# Patient Record
Sex: Female | Born: 1970 | Race: White | Hispanic: No | Marital: Single | Smoking: Former smoker
Health system: Southern US, Community
[De-identification: ages and names within clinical notes are randomized; demographics above are authoritative.]

## PROBLEM LIST (undated history)

## (undated) DIAGNOSIS — J449 Chronic obstructive pulmonary disease, unspecified: Secondary | ICD-10-CM

## (undated) DIAGNOSIS — J45909 Unspecified asthma, uncomplicated: Secondary | ICD-10-CM

## (undated) DIAGNOSIS — I639 Cerebral infarction, unspecified: Secondary | ICD-10-CM

## (undated) DIAGNOSIS — T7840XA Allergy, unspecified, initial encounter: Secondary | ICD-10-CM

## (undated) DIAGNOSIS — I10 Essential (primary) hypertension: Secondary | ICD-10-CM

## (undated) DIAGNOSIS — E119 Type 2 diabetes mellitus without complications: Secondary | ICD-10-CM

## (undated) DIAGNOSIS — F319 Bipolar disorder, unspecified: Secondary | ICD-10-CM

---

## 2007-04-22 ENCOUNTER — Other Ambulatory Visit: Payer: Self-pay

## 2007-04-23 ENCOUNTER — Inpatient Hospital Stay: Payer: Self-pay | Admitting: Internal Medicine

## 2007-04-30 ENCOUNTER — Emergency Department: Payer: Self-pay | Admitting: Internal Medicine

## 2007-12-05 ENCOUNTER — Other Ambulatory Visit: Payer: Self-pay

## 2007-12-05 ENCOUNTER — Inpatient Hospital Stay: Payer: Self-pay | Admitting: *Deleted

## 2007-12-09 ENCOUNTER — Other Ambulatory Visit: Payer: Self-pay

## 2009-01-05 ENCOUNTER — Inpatient Hospital Stay: Payer: Self-pay | Admitting: Internal Medicine

## 2009-02-06 ENCOUNTER — Inpatient Hospital Stay: Payer: Self-pay | Admitting: Internal Medicine

## 2009-04-21 ENCOUNTER — Ambulatory Visit: Payer: Self-pay | Admitting: Critical Care Medicine

## 2009-04-21 ENCOUNTER — Inpatient Hospital Stay (HOSPITAL_COMMUNITY): Admission: EM | Admit: 2009-04-21 | Discharge: 2009-04-25 | Payer: Self-pay | Admitting: Emergency Medicine

## 2009-05-04 ENCOUNTER — Encounter: Payer: Self-pay | Admitting: *Deleted

## 2009-05-04 DIAGNOSIS — J4489 Other specified chronic obstructive pulmonary disease: Secondary | ICD-10-CM

## 2009-05-04 DIAGNOSIS — J449 Chronic obstructive pulmonary disease, unspecified: Secondary | ICD-10-CM | POA: Insufficient documentation

## 2009-05-04 DIAGNOSIS — I10 Essential (primary) hypertension: Secondary | ICD-10-CM

## 2009-05-04 HISTORY — DX: Other specified chronic obstructive pulmonary disease: J44.89

## 2009-05-04 HISTORY — DX: Essential (primary) hypertension: I10

## 2009-05-04 HISTORY — DX: Chronic obstructive pulmonary disease, unspecified: J44.9

## 2009-06-06 ENCOUNTER — Encounter (INDEPENDENT_AMBULATORY_CARE_PROVIDER_SITE_OTHER): Payer: Self-pay | Admitting: *Deleted

## 2009-06-06 ENCOUNTER — Telehealth (INDEPENDENT_AMBULATORY_CARE_PROVIDER_SITE_OTHER): Payer: Self-pay | Admitting: *Deleted

## 2009-08-18 ENCOUNTER — Inpatient Hospital Stay: Payer: Self-pay | Admitting: Internal Medicine

## 2009-09-15 ENCOUNTER — Inpatient Hospital Stay: Payer: Self-pay | Admitting: Internal Medicine

## 2009-09-28 ENCOUNTER — Inpatient Hospital Stay: Payer: Self-pay | Admitting: Internal Medicine

## 2009-11-08 ENCOUNTER — Ambulatory Visit: Payer: Self-pay

## 2009-11-20 ENCOUNTER — Emergency Department: Payer: Self-pay | Admitting: Emergency Medicine

## 2010-01-03 ENCOUNTER — Emergency Department: Payer: Self-pay | Admitting: Emergency Medicine

## 2010-01-04 ENCOUNTER — Emergency Department: Payer: Self-pay | Admitting: Emergency Medicine

## 2010-04-30 ENCOUNTER — Emergency Department (HOSPITAL_COMMUNITY): Admission: EM | Admit: 2010-04-30 | Discharge: 2010-04-30 | Payer: Self-pay | Admitting: Emergency Medicine

## 2010-07-07 ENCOUNTER — Emergency Department: Payer: Self-pay | Admitting: Emergency Medicine

## 2010-07-08 ENCOUNTER — Inpatient Hospital Stay: Payer: Self-pay | Admitting: Internal Medicine

## 2010-08-21 ENCOUNTER — Emergency Department: Payer: Self-pay | Admitting: Emergency Medicine

## 2010-12-22 LAB — DIFFERENTIAL
Basophils Absolute: 0 10*3/uL (ref 0.0–0.1)
Basophils Relative: 0 % (ref 0–1)
Eosinophils Absolute: 0.3 10*3/uL (ref 0.0–0.7)
Lymphocytes Relative: 18 % (ref 12–46)
Lymphs Abs: 3.6 10*3/uL (ref 0.7–4.0)
Monocytes Absolute: 1 10*3/uL (ref 0.1–1.0)
Monocytes Relative: 5 % (ref 3–12)
Neutrophils Relative %: 76 % (ref 43–77)

## 2010-12-22 LAB — URINALYSIS, ROUTINE W REFLEX MICROSCOPIC
Glucose, UA: NEGATIVE mg/dL
Ketones, ur: 15 mg/dL — AB
Protein, ur: 100 mg/dL — AB
pH: 5 (ref 5.0–8.0)

## 2010-12-22 LAB — POCT CARDIAC MARKERS
CKMB, poc: <1 ng/mL — ABNORMAL LOW (ref 1.0–8.0)
Myoglobin, poc: 72.3 ng/mL (ref 12–200)
Troponin i, poc: <0.05 ng/mL (ref 0.00–0.09)

## 2010-12-22 LAB — URINE CULTURE
Colony Count: 15000
Culture  Setup Time: 201107251835

## 2010-12-22 LAB — PREGNANCY, URINE: Preg Test, Ur: NEGATIVE

## 2010-12-22 LAB — POCT I-STAT, CHEM 8
Glucose, Bld: 187 mg/dL — ABNORMAL HIGH (ref 70–99)
Potassium: 4 mEq/L (ref 3.5–5.1)

## 2010-12-22 LAB — URINE MICROSCOPIC-ADD ON

## 2010-12-22 LAB — CBC
HCT: 43.4 % (ref 36.0–46.0)
RDW: 14.2 % (ref 11.5–15.5)

## 2011-01-05 ENCOUNTER — Emergency Department: Payer: Self-pay | Admitting: Internal Medicine

## 2011-01-06 ENCOUNTER — Emergency Department: Payer: Self-pay | Admitting: Emergency Medicine

## 2011-01-13 LAB — TSH: TSH: 0.184 u[IU]/mL — ABNORMAL LOW (ref 0.350–4.500)

## 2011-01-13 LAB — CBC
HCT: 38.5 % (ref 36.0–46.0)
HCT: 38.6 % (ref 36.0–46.0)
HCT: 39.8 % (ref 36.0–46.0)
HCT: 40.1 % (ref 36.0–46.0)
Hemoglobin: 13 g/dL (ref 12.0–15.0)
Hemoglobin: 14 g/dL (ref 12.0–15.0)
Hemoglobin: 14.2 g/dL (ref 12.0–15.0)
MCHC: 33.9 g/dL (ref 30.0–36.0)
MCHC: 34 g/dL (ref 30.0–36.0)
MCHC: 34.4 g/dL (ref 30.0–36.0)
MCV: 83.8 fL (ref 78.0–100.0)
MCV: 85.1 fL (ref 78.0–100.0)
Platelets: 218 10*3/uL (ref 150–400)
Platelets: 218 10*3/uL (ref 150–400)
RBC: 4.52 MIL/uL (ref 3.87–5.11)
RBC: 4.81 MIL/uL (ref 3.87–5.11)
RBC: 5 MIL/uL (ref 3.87–5.11)
RDW: 13.8 % (ref 11.5–15.5)
RDW: 13.9 % (ref 11.5–15.5)
RDW: 14.2 % (ref 11.5–15.5)
RDW: 14.3 % (ref 11.5–15.5)
RDW: 14.4 % (ref 11.5–15.5)
WBC: 11.1 10*3/uL — ABNORMAL HIGH (ref 4.0–10.5)

## 2011-01-13 LAB — GLUCOSE, CAPILLARY
Glucose-Capillary: 219 mg/dL — ABNORMAL HIGH (ref 70–99)
Glucose-Capillary: 222 mg/dL — ABNORMAL HIGH (ref 70–99)
Glucose-Capillary: 230 mg/dL — ABNORMAL HIGH (ref 70–99)
Glucose-Capillary: 272 mg/dL — ABNORMAL HIGH (ref 70–99)
Glucose-Capillary: 279 mg/dL — ABNORMAL HIGH (ref 70–99)
Glucose-Capillary: 290 mg/dL — ABNORMAL HIGH (ref 70–99)

## 2011-01-13 LAB — BASIC METABOLIC PANEL
BUN: 18 mg/dL (ref 6–23)
CO2: 25 mEq/L (ref 19–32)
CO2: 29 mEq/L (ref 19–32)
Calcium: 9.3 mg/dL (ref 8.4–10.5)
Calcium: 9.8 mg/dL (ref 8.4–10.5)
Calcium: 9.9 mg/dL (ref 8.4–10.5)
Chloride: 103 mEq/L (ref 96–112)
Chloride: 95 mEq/L — ABNORMAL LOW (ref 96–112)
Creatinine, Ser: 0.85 mg/dL (ref 0.4–1.2)
Creatinine, Ser: 0.93 mg/dL (ref 0.4–1.2)
Creatinine, Ser: 1 mg/dL (ref 0.4–1.2)
GFR calc Af Amer: >60 mL/min (ref >60)
GFR calc non Af Amer: >60 mL/min (ref >60)
GFR calc non Af Amer: >60 mL/min (ref >60)
Glucose, Bld: 188 mg/dL — ABNORMAL HIGH (ref 70–99)
Glucose, Bld: 192 mg/dL — ABNORMAL HIGH (ref 70–99)
Glucose, Bld: 246 mg/dL — ABNORMAL HIGH (ref 70–99)
Glucose, Bld: 284 mg/dL — ABNORMAL HIGH (ref 70–99)
Sodium: 132 mEq/L — ABNORMAL LOW (ref 135–145)
Sodium: 135 mEq/L (ref 135–145)
Sodium: 138 mEq/L (ref 135–145)

## 2011-01-13 LAB — CK TOTAL AND CKMB (NOT AT ARMC)
CK, MB: 0.8 ng/mL (ref 0.3–4.0)
Relative Index: INVALID (ref 0.0–2.5)
Relative Index: INVALID (ref 0.0–2.5)
Relative Index: INVALID (ref 0.0–2.5)
Total CK: 41 U/L (ref 7–177)
Total CK: 73 U/L (ref 7–177)
Total CK: 89 U/L (ref 7–177)
Total CK: 93 U/L (ref 7–177)

## 2011-01-13 LAB — POCT I-STAT, CHEM 8
BUN: 8 mg/dL (ref 6–23)
Creatinine, Ser: 0.9 mg/dL (ref 0.4–1.2)
HCT: 43 % (ref 36.0–46.0)
Potassium: 3.1 mEq/L — ABNORMAL LOW (ref 3.5–5.1)
Sodium: 136 mEq/L (ref 135–145)
TCO2: 21 mmol/L (ref 0–100)

## 2011-01-13 LAB — LIPID PANEL
HDL: 36 mg/dL — ABNORMAL LOW (ref >39)
LDL Cholesterol: 128 mg/dL — ABNORMAL HIGH (ref 0–99)

## 2011-01-13 LAB — HEMOGLOBIN A1C: Mean Plasma Glucose: 128 mg/dL

## 2011-01-22 ENCOUNTER — Emergency Department: Payer: Self-pay | Admitting: Internal Medicine

## 2011-01-25 ENCOUNTER — Emergency Department: Payer: Self-pay | Admitting: *Deleted

## 2011-02-19 NOTE — Consult Note (Signed)
NAMESHARMA, Hoover NO.:  [id number]   MEDICAL RECORD NO.:  [id number]          PATIENT TYPE:  INP   LOCATION:  5039                         FACILITY:  [facility]   PHYSICIAN:  Charlcie Cradle. Delford Field, MD, FCCPDATE OF BIRTH:  [birth year]20-1970   DATE OF CONSULTATION:  04/24/2009  DATE OF DISCHARGE:                                 CONSULTATION   REASON FOR CONSULTATION:  Acute exacerbation of COPD.   HISTORY OF PRESENT ILLNESS:  Ms. Littlepage is a 40 year old female who was  admitted on April 21, 2009, by the hospitalist team with acute  exacerbation of COPD after a 1- to 2-day increase in shortness of  breath.  The patient was subsequently admitted and has been treated as  an inpatient at hospital for acute exacerbation of COPD with IV Avelox  as well as IV steroids and mucolytics.  The patient has not been able to  afford her home medications including Advair and Spiriva for the last  several months.  Pulmonary Critical Care was consulted on April 24, 2009,  for further evaluation and treatment of her COPD as well as initiation  of outpatient pulmonary followup.   ALLERGIES:  No known drug allergies.   HOME MEDICATIONS:  The patient on lisinopril, Advair, Spiriva, and  albuterol nebulizers; however, she has been unable to afford any of  these medications for the last several months.   PAST MEDICAL HISTORY:  COPD.   SOCIAL HISTORY:  The patient is a former smoker, smoked approximately  one pack per day for 20 years and quit in 2009.  No EtOH.  No illicit  drugs.   FAMILY HISTORY:  Only known history is of diabetes.   REVIEW OF SYSTEMS:  The patient does complain of increased shortness of  breath as well as an occasional productive cough.  All other systems  were reviewed and were negative.   PHYSICAL EXAMINATION:  VITAL SIGNS:  Maximum temperature in the last 24  hours has been 98.5, blood pressure 150/96, heart rate 79, respiratory  rate 20, O2 sat is 97% on room air.  GENERAL:  An obese female in no acute distress.  NEUROLOGIC:  Alert and oriented.  Cranial nerves grossly intact.  Strength is 5/5 in all extremities.  Speech is clear.  HEENT:  Mucous membranes are moist.  Pupils are equal, round, reactive  to light.  Dentition is good.  Sclerae are clear.  NECK:  Supple without lymphadenopathy.  No JVD.  No thyromegaly.  CARDIOVASCULAR:  S1 and S2.  Regular rate and rhythm.  No murmurs, rubs,  or gallops.  PULMONARY:  Respiratory rate even and nonlabored.  LUNGS:  Decreased in the bases with faint expiratory wheeze.  ABDOMEN:  Soft, nontender, nondistended.  Bowel sounds are active x4.  SKIN:  Without rashes or lesions.  EXTREMITIES:  Warm and dry with no edema.   RADIOLOGY DATA:  CT of the chest was negative for a PE and showed some  minimal bibasilar atelectasis.  A portal chest x-ray on April 21, 2009,  showed no acute process.   LABORATORY DATA:  Most recent laboratory  data, CBC, white blood cells  11.1, hemoglobin 13.6, hematocrit 39.8, platelets 218.  Basic metabolic  panel, sodium 137, potassium 4.5, BUN 18, creatinine 0.93, glucose 192.  BNP was 115.   MICRO DATA:  At the time of this dictation, there is no micro data  available.   IMPRESSION AND PLAN:  Acute exacerbation of chronic obstructive  pulmonary disease.  This is improving.  This is likely multifactorial  and may in part be related to her not being on her maintenance  medications for several months.  The patient does certainly need regular  pulmonary followup.   RECOMMENDATIONS:  1. The patient should not be on an ACE inhibitor due to her upper      airway wheezing.  She does need to be on another class of      antihypertensive.  2. Continue nebs at home, Pulmicort b.i.d. as well as albuterol and      Atrovent q.i.d.  We will write for Case Management to arrange a      nebulizer machine at home.  3. Outpatient followup with Dr. Marchelle Gearing.  Please see the pink sheet      for  appointment details.  4. P.o. prednisone tapering antibiotics on discharge.  We will change      her IV Solu-Medrol to a p.o. prednisone taper and continue her p.o.      antibiotics for a total of 7 days.   Discontinue her Advair and Spiriva.  She is not going to be able to  afford these as an outpatient and continue her Pulmicort and DuoNebs.  Again, the patient will follow up as an outpatient with Dr. Marchelle Gearing  and will continue to need outpatient pulmonary followup.   Thank you very much for the consult.      Karla Dress, NP      Charlcie Cradle. Delford Field, MD, [facility]  Electronically Signed    KW/MEDQ  D:  04/24/2009  T:  04/25/2009  Job:  [id number]

## 2011-02-19 NOTE — Consult Note (Signed)
NAMEDAYSY, SANTINI NO.:  [id number]   MEDICAL RECORD NO.:  [id number]          PATIENT TYPE:  INP   LOCATION:  5039                         FACILITY:  [facility]   PHYSICIAN:  Charlcie Cradle. Delford Field, MD, FCCPDATE OF BIRTH:  1971/09/27   DATE OF CONSULTATION:  DATE OF DISCHARGE:                                 CONSULTATION   CHIEF COMPLAINT:  Shortness of breath, dyspnea, and cough.   HISTORY OF PRESENT ILLNESS:  A 40 year old female admitted on April 21, 2009, by the [employer].  Dr. Marchelle Gearing consulted on July 18,  deferred consultation to July 19.  I was asked to see this patient for  Dr. Marchelle Gearing.  The patient has admission with acute exacerbation of  COPD after 2 days of increased dyspnea and cough.  The patient had  increased wheezing.  She was seen in an occupational health center and  occupational health center recently involved getting being tested for  pulmonary function who had an exacerbation of same.  The patient was  brought in the hospital for steroid treatment and Avelox.  She is less  dyspneic at this time and is coughing less and is in an improved status.  We were asked to see this patient to establish an outpatient followup.  She does not have a primary care physician or a pulmonologist at this  time.  She is from the [place] area.   PAST MEDICAL HISTORY:  Medical history of COPD only.  Also history of  hypertension.   SOCIAL HISTORY:  Quit smoking in 2009, has a 20 pack-year smoking  history.   FAMILY HISTORY:  Positive for diabetes.   REVIEW OF SYSTEMS:  GENERAL:  No fever, chills, or sweats.  HEENT:  No  headaches, nasal discharge, bleeding, voice change, vertigo,  photophobia, vision or hearing loss.  SKIN:  No rash or lesions.  CV/PULMONARY:  Patient is dyspneic at rest on exertion, has cough.  No  paroxysmal nocturnal dyspnea.  No chest pain.  No edema, palpitations,  orthopnea, or syncope.  GENITOURINARY:  No frequency,  urgency, dysuria,  hematuria.  GI:  No nausea, vomiting, melena, hematemesis.  Endocrine:  No polyuria, polydipsia, heat or cold intolerance.   Patient is not able to afford her home medication program, which  included Advair and Spiriva.   MEDICATIONS PRIOR TO ADMISSION:  1. Lisinopril 10 mg daily.  2. Advair 250/50 one spray b.i.d.  3. Albuterol 2 puffs every 4 hours.  4. Remeron 15 mg at bedtime.  5. Guaifenesin 600 mg b.i.d.  6. HCTZ 12.5 mg daily.  7. Klonopin 0.25 mg b.i.d. p.r.n.  8. Singulair 10 mg daily.  9. Spiriva daily.  10.Theo-24 daily.  11.Tussionex as needed.  12.Albuterol and Atrovent by nebulization q.8 h.   ALLERGIES:  None.   PHYSICAL EXAMINATION:  GENERAL:  This is an obese female in no distress.  VITAL SIGNS:  Temperature 98, blood pressure 150/96, pulse 79,  respirations 20, saturation 97% on room air.  NEUROLOGIC:  Awake, alert, oriented x3.  Cranial nerves II through XII  intact.  Strength 5/5 in all areas  tested.  Speech is clear.  Normal  sensation.  HEENT:  Normocephalic, atraumatic.  Pupils are equal, round, and  reactive to light and accommodation.  Sclerae clear.  Tympanic membranes  clear.  Nares clear.  NECK:  Supple.  No thyromegaly.  No bruits.  No jugular venous  distension.  CV:  Normal S1 and S2.  No S3 or S4.  Regular rate and rhythm.  No  murmur, rub, heave, or gallop.  Normal PMI.  Pulses 2+.  PULMONARY:  Respirations even, unlabored.  Decreased breath sounds at  the bases.  Faint expiratory wheeze, few scattered rhonchi.  GI:  Abdomen soft, nontender.  Bowel sounds active.  No organomegaly.  GENITOURINARY:  Normal genitalia.  No discharge or lesions.  MUSCULOSKELETAL:  Mental status exam intact.  No joint deformities or  effusions.  No spine or CVA tenderness.  SKIN:  No rashes or lesions.  Other than this, patient is warm and dry,  does not have edema.   LABORATORY DATA AND RADIOLOGIC STUDIES:  CT chest shows no pulmonary   embolism, mass, or atelectasis.  Chest x-ray showed no acute process.  Sodium 137, potassium 4.5, chloride 99, CO2 of 29, BUN 18, creatinine  0.9, blood sugar 192.  BNP 115.   IMPRESSION:  Acute exacerbation of chronic obstructive pulmonary disease  with improving likely due to not lack of maintenance medications over  several months.   RECOMMENDATIONS:  I would recommend no ACE inhibitor in this patient due  to upper airway instability, would recommend a combination of clonidine  and hydrochlorothiazide in this patient and discontinuance of  lisinopril.  Continue home nebs including Pulmicort b.i.d. and albuterol  and Atrovent q.i.d.  Discontinue Advair and Spiriva.  Oral prednisone  taper, on oral antibiotics, no oxygen needed.  I have established a  followup visit with Dr. Kalman Shan on July 30 at 1:30 p.m. for  outpatient followup.      Charlcie Cradle Delford Field, MD, [facility]  Electronically Signed     PEW/MEDQ  D:  04/25/2009  T:  04/25/2009  Job:  [id number]   cc:   Isidor Holts, M.D.  Kalman Shan, MD

## 2011-02-19 NOTE — Group Therapy Note (Signed)
Karla Hoover, Karla Hoover NO.:  [id number]   MEDICAL RECORD NO.:  [id number]          PATIENT TYPE:  INP   LOCATION:  5039                         FACILITY:  [facility]   PHYSICIAN:  Isidor Holts, M.D.  DATE OF BIRTH:  [birth year]                                 PROGRESS NOTE   PMD:  Unassigned.   DISCHARGE DIAGNOSES:  1. Acute bronchitis.  2. Exacerbation of chronic obstructive pulmonary disease, secondary to      #1 above.  3. Hypertension.  4. Type 2 diabetes mellitus, new diagnosis.  5. Morbid obesity.  6. Dyslipidemia.  7. Mild psoriasis.   DISCHARGE MEDICATIONS:  These will be listed in addendum at the appropriate time, by discharging  M.D.   PROCEDURES:  1. Chest x-ray done April 21, 2009.  This showed no active      cardiopulmonary disease.  2. Chest CT angiogram done April 22, 2009.  This showed no evidence of      pulmonary emboli or thoracic aortic aneurysm/dissection.  There was      minimal bibasilar atelectasis.   CONSULTATIONS:  [facility], Dr. Kalman Shan.   ADMISSION HISTORY:  As in H and P notes of April 21, 2009, dictated by Dr. Midge Minium.  However, in brief, this is a 40 year old female, with known history of  COPD, ex-smoker, quit approximately 1 year ago, presenting with  increasing shortness of breath and wheeze of a few hours' duration.  According to patient, she had come for a disability evaluation on day of  presentation, however, while pulmonary function testing was being done,  she became acutely short of breath and wheezy.  She therefore presented  to the emergency department, and was subsequently admitted for further  evaluation, investigation, and management.   CLINICAL COURSE:  1. Exacerbation of COPD.  For details of presentation, refer to      admission history above. On detailed questioning, it appears that      patient has been troubled by shortness of breath and wheeze during      the past year,  however, this was acutely exacerbated on the day of      presentation.  She did admit to mild cough productive of yellowish      phlegm.  She was managed with bronchodilator, nebulizers, oxygen      supplementation, parenteral steroids, as well as Mucinex.  By April 24, 2009, she had showed considerable improvement.  We were      therefore able to transition her to oral steroids.  Because of      severe exacerbation of COPD, it ws felt that Pulmonary input would      be greatly beneficial, and patient would require Pulmonary followup      on discharge.  Pulmonary consultation has been requested.  I did      discuss with Dr. Marchelle Gearing, pulmonologist, on April 23, 2009, and he      has assured me that consultation will be provided.   1. Acute bronchitis.  Patient, as mentioned above, does have a mild  cough, productive of yellowish phlegm.  As chest x-ray was negative      for consolidative changes, likely this was secondary to acute      bronchitis and was a likely the precipitant for #1 above.  She was      managed with Avelox and as of April 24, 2009, she was on day #4 of a      planned 7-day course of this treatment, to be completed on April 27, 2009.   1. Hypertension.  Patient found to have significantly elevated blood      pressures, during the course of her hospitalization.  She was      managed with a combination of ACE inhibitor and Clonidine, with      satisfactory clinical response.  Her antihypertensive medications      may need further titration, prior to discharge.   1. Type 2 diabetes mellitus.  This is a new diagnosis.  Patient was      found to have significantly elevated blood glucose levels at 227,      at the time of presentation.  She does have a positive family      history of diabetes mellitus, and says that in the past, she has      been noted on occasion to have elevated blood sugars, but has never      been on treatment for this.  She was managed with  carbohydrate-      modified diet, Metformin, sliding scale insulin coverage during the      course of this hospitalization, and has undergone diabetic      teaching.  Of note, hemoglobin A1c was between 6.3 and 6.1.      Likely, this is type 2 diabetes mellitus.   1. Dyslipidemia.  Patient's lipid profile was checked and showed the      following findings:  Total cholesterol 201, triglycerides 186, HDL      36, LDL 128.  In view of patient's diabetes mellitus, target LDL      should be less than 100. Statin treatment has therefore, been      started accordingly.   1. Mild psoriasis.  Patient does have mild psoriatic plaques,      affecting the elbow regions.  She will require a dermatologist in      due course, however, this is not problematic at the present time.   DISPOSITION:  This will be elucidated in detail in addendum at the appropriate time,  by discharging M.D.  However, as of April 24, 2009, patient was clearly  nearing discharge, and it is anticipated that over the next few days,  she will have recovered sufficiently to be discharged.  She also  certainly needs to establish a PMD, likely at [facility], and will need  Pulmonary followup, as well as outpatient diabetic clinic followup.      Isidor Holts, M.D.  Electronically Signed     CO/MEDQ  D:  04/24/2009  T:  04/24/2009  Job:  [id number]

## 2011-02-19 NOTE — H&P (Signed)
Karla Hoover, Karla Hoover NO.:  [id number]   MEDICAL RECORD NO.:  [id number]          PATIENT TYPE:  EMS   LOCATION:  MAJO                         FACILITY:  [facility]   PHYSICIAN:  Eduard Clos, MDDATE OF BIRTH:  [birth year]   DATE OF ADMISSION:  04/21/2009  DATE OF DISCHARGE:                              HISTORY & PHYSICAL   PRIMARY CARE PHYSICIAN:  Unassigned.   CHIEF COMPLAINT:  Shortness of breath.   HISTORY OF PRESENT ILLNESS:  This 40 year old female with the history of  20 years of cigarette-smoking, who quit a year ago, presented with  increasing shortness of breath over the last few hours.  Patient had  gone for a disability check today, when she got short of breath and was  increasing in intensity, came to the ER.  Patient was found to be short  of breath with wheezing bilaterally.  Chest x-ray did not show anything  acute.  Symptoms have been persistent.  Patient has been admitted for  further management.   Patient denies any fever, chills or dyspnea.  Has been having cough with  productive sputum.  Has some chest tightness on deep inspiration when  she coughs.  Otherwise, has no chest discomfort.  Denies any nausea or  vomiting, abdominal pain, fever, chills, dysuria, discharge or diarrhea.   PAST MEDICAL HISTORY:  COPD.   PAST SURGICAL HISTORY:  None.   MEDICATIONS ON ADMISSION:  Patient is off her medication for almost a  month.  Used to be on lisinopril, dose not exactly known, for blood  pressure.  Then used to take Spiriva and albuterol and Advair Diskus.   ALLERGIES:  No known drug allergies.   SOCIAL HISTORY:  Patient quit smoking a year ago.  Used to smoke for  almost 20 years.  Denies any alcohol or drug abuse.   FAMILY HISTORY:  Significant for diabetes.   REVIEW OF SYSTEMS:  As in history of present illness, nothing else  significant.   PHYSICAL EXAMINATION:  Patient was seen at bedside, mildly short of  breath, but is able to  complete sentences without difficulty.  VITAL SIGNS:  Blood pressure 114/60, pulse 106 per minute, temperature  99.3, respirations 18 per minute, O2 sat 91%.  HEENT:  Anicteric.  No pallor.  CHEST:  Bilateral air entry present.  Bilateral expiratory wheeze.  CARDIOVASCULAR:  S1, S2 heard.  ABDOMEN:  Soft, nontender, bowel sounds heard.  CNS:  Alert and oriented to time, place and person.  Moves upper and  lower extremities  is present.  No edema.   LABS:  Chest x-ray shows no active cardiopulmonary disease.  CBC:  WBC  7.3, hemoglobin 14.6, hematocrit 43, platelets 175.  Basic metabolic  panel:  Sodium 136, potassium 3.1, chloride 104, glucose 227, creatinine  0.9.   ASSESSMENT:  1. Chronic obstructive pulmonary disease exacerbation.  2. Hyperglycemia, possibly from steroids, but I have to rule out      diabetes, given the family history.  3. History of hypertension.  4. Noncompliant.  5. History of cigarette-smoking; quit a year ago.   PLAN:  1.  We will admit patient to telemetry for closer followup.  2. We will place patient on IV Avelox once diagnostic screen is      negative.  3. We will place patient on Solu-Medrol and slowly taper it to p.o.      prednisone once patient's condition improves.  4. Will place her on bronchodilators and steroid nebulizer.  5. Check hemoglobin A1c for ruling out diabetes, type 2.  6. Further recommendations as condition evolves.      Eduard Clos, MD  Electronically Signed     ANK/MEDQ  D:  04/21/2009  T:  04/21/2009  Job:  [id number]

## 2011-02-19 NOTE — Discharge Summary (Signed)
Karla Hoover, CARE NO.:  [id number]   MEDICAL RECORD NO.:  [id number]          PATIENT TYPE:  INP   LOCATION:  5039                         FACILITY:  [facility]   PHYSICIAN:  Isidor Holts, M.D.  DATE OF BIRTH:  15-Aug-1971   DATE OF ADMISSION:  04/21/2009  DATE OF DISCHARGE:  04/25/2009                               DISCHARGE SUMMARY   ADDENDUM   PRIMARY MEDICAL DOCTOR:  Unassigned.   For discharge diagnoses, refer to interim summary dictated on April 24, 2009, by this MD.  Refer also to above interim summary, for details of  admission history, procedures, consultations, as well as clinical  course.  The patient was subsequently seen in consultation by Dr.  Shan Levans, pulmonologist, on April 24, 2009.  For details of that  consultation, refer to consultation notes of that date.  He has  recommended discontinuation of ACE inhibitor and has recommended  utilization of alternative antihypertensive medications, for BP control.  He has also recommended the patient to continue bronchodilator  nebulizers at home, including Pulmicort, as well as a combination of  Albuterol and Atrovent.  The patient on April 25, 2009, was feeling  considerably better, asymptomatic, no longer wheezy, and was very keen  to be discharged.  She was therefore, discharged accordingly.   DISCHARGE MEDICATIONS:  1. Clonidine 0.3 mg p.o. b.i.d.  2. Augmentin 875 mg p.o. b.i.d. for 3 days, from April 26, 2009.  3. Metformin 500 mg p.o. b.i.d.  4. Lantus insulin 20 units subcutaneously at bedtime.  5. DuoNeb nebulizer 1 treatment q.i.d.  6. Pulmicort (0.5 mg) nebulizer 1 treatment twice daily.  7. Prednisone 30 mg p.o. daily for 3 days from April 26, 2009, then 20      mg p.o. daily for 3 days, then 10 mg p.o. daily for 3 days, then      stop.   DIET:  Heart-healthy/carbohydrate modified.   ACTIVITY:  As tolerated.   FOLLOWUP INSTRUCTIONS:  The patient is to follow up with Dr. Kalman Shan, pulmonologist, on May 06, 2009.  An appointment has been  scheduled for 1:30 p.m.  Telephone number [phone].  In addition,  arrangements have been put in place for followup at [facility] or  equivalent, at [facility].  Outpatient diabetic education has been  recommended.  An attempt will be made to arrange this.  All this has  been communicated to the patient, who has verbalized understanding.     Isidor Holts, M.D.  Electronically Signed    CO/MEDQ  D:  04/25/2009  T:  04/26/2009  Job:  [id number]

## 2011-04-26 ENCOUNTER — Emergency Department: Payer: Self-pay | Admitting: Emergency Medicine

## 2011-04-27 ENCOUNTER — Emergency Department: Payer: Self-pay | Admitting: Emergency Medicine

## 2011-04-27 ENCOUNTER — Inpatient Hospital Stay: Payer: Self-pay | Admitting: Internal Medicine

## 2011-06-26 ENCOUNTER — Emergency Department: Payer: Self-pay | Admitting: Emergency Medicine

## 2011-07-13 ENCOUNTER — Emergency Department: Payer: Self-pay | Admitting: Emergency Medicine

## 2011-07-26 ENCOUNTER — Emergency Department: Payer: Self-pay | Admitting: Emergency Medicine

## 2011-08-16 ENCOUNTER — Emergency Department: Payer: Self-pay | Admitting: Emergency Medicine

## 2011-08-20 ENCOUNTER — Inpatient Hospital Stay: Payer: Self-pay | Admitting: Student

## 2012-02-18 ENCOUNTER — Emergency Department: Payer: Self-pay | Admitting: Emergency Medicine

## 2012-02-18 LAB — COMPREHENSIVE METABOLIC PANEL
Alkaline Phosphatase: 76 U/L (ref 50–136)
Anion Gap: 9 (ref 7–16)
BUN: 8 mg/dL (ref 7–18)
Bilirubin,Total: 0.3 mg/dL (ref 0.2–1.0)
Chloride: 105 mmol/L (ref 98–107)
Co2: 25 mmol/L (ref 21–32)
Glucose: 89 mg/dL (ref 65–99)
Potassium: 3.8 mmol/L (ref 3.5–5.1)
SGOT(AST): 20 U/L (ref 15–37)

## 2012-02-18 LAB — CBC
MCH: 27.4 pg (ref 26.0–34.0)
MCHC: 33.8 g/dL (ref 32.0–36.0)
WBC: 11.6 10*3/uL — ABNORMAL HIGH (ref 3.6–11.0)

## 2012-02-18 LAB — CK TOTAL AND CKMB (NOT AT ARMC)
CK, Total: 70 U/L (ref 21–215)
CK-MB: 0.8 ng/mL (ref 0.5–3.6)

## 2012-04-03 ENCOUNTER — Emergency Department: Payer: Self-pay | Admitting: Emergency Medicine

## 2012-04-03 LAB — BASIC METABOLIC PANEL
Anion Gap: 8 (ref 7–16)
Calcium, Total: 8.9 mg/dL (ref 8.5–10.1)
Chloride: 103 mmol/L (ref 98–107)
Creatinine: 0.93 mg/dL (ref 0.60–1.30)
Sodium: 137 mmol/L (ref 136–145)

## 2012-04-06 ENCOUNTER — Emergency Department: Payer: Self-pay | Admitting: *Deleted

## 2012-04-25 ENCOUNTER — Emergency Department: Payer: Self-pay | Admitting: Emergency Medicine

## 2012-04-25 LAB — COMPREHENSIVE METABOLIC PANEL
Albumin: 3.8 g/dL (ref 3.4–5.0)
Alkaline Phosphatase: 84 U/L (ref 50–136)
Bilirubin,Total: 0.4 mg/dL (ref 0.2–1.0)
Creatinine: 0.99 mg/dL (ref 0.60–1.30)
Glucose: 147 mg/dL — ABNORMAL HIGH (ref 65–99)
Osmolality: 277 (ref 275–301)
Sodium: 138 mmol/L (ref 136–145)
Total Protein: 7.9 g/dL (ref 6.4–8.2)

## 2012-04-25 LAB — URINALYSIS, COMPLETE
Bilirubin,UR: NEGATIVE
Nitrite: POSITIVE
Ph: 5 (ref 4.5–8.0)
Squamous Epithelial: 3
WBC UR: 24 /HPF (ref 0–5)

## 2012-04-25 LAB — CBC
HCT: 46.3 % (ref 35.0–47.0)
MCV: 83 fL (ref 80–100)
RDW: 13.7 % (ref 11.5–14.5)
WBC: 11.8 10*3/uL — ABNORMAL HIGH (ref 3.6–11.0)

## 2012-04-26 LAB — TROPONIN I: Troponin-I: <0.02 ng/mL

## 2012-06-20 ENCOUNTER — Inpatient Hospital Stay: Payer: Self-pay | Admitting: Internal Medicine

## 2012-06-20 LAB — COMPREHENSIVE METABOLIC PANEL
BUN: 11 mg/dL (ref 7–18)
Bilirubin,Total: 0.2 mg/dL (ref 0.2–1.0)
Chloride: 104 mmol/L (ref 98–107)
Creatinine: 0.98 mg/dL (ref 0.60–1.30)
EGFR (African American): >60
Glucose: 143 mg/dL — ABNORMAL HIGH (ref 65–99)
Osmolality: 281 (ref 275–301)
SGPT (ALT): 16 U/L (ref 12–78)
Sodium: 140 mmol/L (ref 136–145)
Total Protein: 8.2 g/dL (ref 6.4–8.2)

## 2012-06-20 LAB — CBC
MCH: 28.3 pg (ref 26.0–34.0)
MCHC: 34.4 g/dL (ref 32.0–36.0)
MCV: 82 fL (ref 80–100)
Platelet: 268 10*3/uL (ref 150–440)
RDW: 13.8 % (ref 11.5–14.5)
WBC: 12.7 10*3/uL — ABNORMAL HIGH (ref 3.6–11.0)

## 2012-06-20 LAB — TROPONIN I: Troponin-I: <0.02 ng/mL

## 2012-06-21 LAB — BASIC METABOLIC PANEL
BUN: 11 mg/dL (ref 7–18)
Chloride: 105 mmol/L (ref 98–107)
Creatinine: 1.03 mg/dL (ref 0.60–1.30)
Potassium: 4.4 mmol/L (ref 3.5–5.1)

## 2012-06-21 LAB — CBC WITH DIFFERENTIAL/PLATELET
Basophil %: 0.1 %
Eosinophil #: 0 10*3/uL (ref 0.0–0.7)
Eosinophil %: 0 %
HCT: 40.6 % (ref 35.0–47.0)
Lymphocyte #: 1.1 10*3/uL (ref 1.0–3.6)
MCH: 28 pg (ref 26.0–34.0)
MCHC: 34 g/dL (ref 32.0–36.0)
MCV: 82 fL (ref 80–100)
Monocyte #: 0.1 x10 3/mm — ABNORMAL LOW (ref 0.2–0.9)
Neutrophil #: 7 10*3/uL — ABNORMAL HIGH (ref 1.4–6.5)

## 2012-06-26 LAB — CULTURE, BLOOD (SINGLE)

## 2012-11-24 ENCOUNTER — Emergency Department: Payer: Self-pay | Admitting: Emergency Medicine

## 2012-11-24 LAB — BASIC METABOLIC PANEL
Anion Gap: 8 (ref 7–16)
EGFR (African American): >60
EGFR (Non-African Amer.): >60
Glucose: 281 mg/dL — ABNORMAL HIGH (ref 65–99)

## 2012-11-24 LAB — CBC
HGB: 14.6 g/dL (ref 12.0–16.0)
MCH: 27.2 pg (ref 26.0–34.0)
MCHC: 33.8 g/dL (ref 32.0–36.0)
MCV: 81 fL (ref 80–100)
Platelet: 248 10*3/uL (ref 150–440)
RBC: 5.35 10*6/uL — ABNORMAL HIGH (ref 3.80–5.20)
RDW: 13.9 % (ref 11.5–14.5)

## 2012-11-24 LAB — URINALYSIS, COMPLETE
Bilirubin,UR: NEGATIVE
Blood: NEGATIVE
Nitrite: NEGATIVE
Ph: 6 (ref 4.5–8.0)
Specific Gravity: 1 (ref 1.003–1.030)
WBC UR: NONE SEEN /HPF (ref 0–5)

## 2012-11-29 ENCOUNTER — Inpatient Hospital Stay: Payer: Self-pay | Admitting: Internal Medicine

## 2012-11-29 LAB — CK TOTAL AND CKMB (NOT AT ARMC)
CK, Total: 61 U/L (ref 21–215)
CK-MB: <0.5 ng/mL — ABNORMAL LOW (ref 0.5–3.6)

## 2012-11-29 LAB — CBC
HCT: 43.5 % (ref 35.0–47.0)
MCH: 27.4 pg (ref 26.0–34.0)
MCHC: 33.7 g/dL (ref 32.0–36.0)
MCV: 81 fL (ref 80–100)
Platelet: 267 10*3/uL (ref 150–440)
WBC: 15.5 10*3/uL — ABNORMAL HIGH (ref 3.6–11.0)

## 2012-11-29 LAB — BASIC METABOLIC PANEL
Anion Gap: 9 (ref 7–16)
BUN: 16 mg/dL (ref 7–18)
Chloride: 97 mmol/L — ABNORMAL LOW (ref 98–107)
Co2: 25 mmol/L (ref 21–32)
EGFR (African American): >60
Osmolality: 274 (ref 275–301)

## 2012-11-30 LAB — CBC WITH DIFFERENTIAL/PLATELET
Eosinophil %: 0 %
HCT: 41.5 % (ref 35.0–47.0)
Lymphocyte #: 1.2 10*3/uL (ref 1.0–3.6)
MCHC: 33.2 g/dL (ref 32.0–36.0)
Monocyte #: 0.1 x10 3/mm — ABNORMAL LOW (ref 0.2–0.9)
Neutrophil %: 89.6 %
RBC: 5.07 10*6/uL (ref 3.80–5.20)
RDW: 14.1 % (ref 11.5–14.5)
WBC: 12.9 10*3/uL — ABNORMAL HIGH (ref 3.6–11.0)

## 2012-11-30 LAB — BASIC METABOLIC PANEL
Anion Gap: 8 (ref 7–16)
BUN: 19 mg/dL — ABNORMAL HIGH (ref 7–18)
Chloride: 100 mmol/L (ref 98–107)
Co2: 25 mmol/L (ref 21–32)
Creatinine: 1.18 mg/dL (ref 0.60–1.30)
EGFR (African American): >60
EGFR (Non-African Amer.): 58 — ABNORMAL LOW
Glucose: 361 mg/dL — ABNORMAL HIGH (ref 65–99)
Osmolality: 283 (ref 275–301)

## 2012-11-30 LAB — LIPID PANEL: Triglycerides: 518 mg/dL — ABNORMAL HIGH (ref 0–200)

## 2012-12-10 ENCOUNTER — Emergency Department: Payer: Self-pay | Admitting: Internal Medicine

## 2012-12-10 LAB — CBC
MCH: 27.5 pg (ref 26.0–34.0)
MCV: 81 fL (ref 80–100)
Platelet: 257 10*3/uL (ref 150–440)
WBC: 12.4 10*3/uL — ABNORMAL HIGH (ref 3.6–11.0)

## 2012-12-10 LAB — COMPREHENSIVE METABOLIC PANEL
Alkaline Phosphatase: 119 U/L (ref 50–136)
Anion Gap: 9 (ref 7–16)
BUN: 11 mg/dL (ref 7–18)
Calcium, Total: 8.6 mg/dL (ref 8.5–10.1)
Co2: 24 mmol/L (ref 21–32)
Creatinine: 1.01 mg/dL (ref 0.60–1.30)
EGFR (Non-African Amer.): >60
Osmolality: 282 (ref 275–301)
Sodium: 132 mmol/L — ABNORMAL LOW (ref 136–145)
Total Protein: 7.2 g/dL (ref 6.4–8.2)

## 2012-12-10 LAB — PRO B NATRIURETIC PEPTIDE: B-Type Natriuretic Peptide: 155 pg/mL — ABNORMAL HIGH (ref 0–125)

## 2012-12-10 LAB — CK TOTAL AND CKMB (NOT AT ARMC): CK, Total: 54 U/L (ref 21–215)

## 2012-12-10 LAB — TROPONIN I: Troponin-I: <0.02 ng/mL

## 2015-01-24 NOTE — Consult Note (Signed)
PATIENT NAME:  Karla Hoover, Karla Hoover MR#:  [id number] DATE OF BIRTH:  Jul 29, 1971  DATE OF CONSULTATION:  06/20/2012  REFERRING PHYSICIAN:  Janalyn Harderavid Kaminski, MD   CONSULTING PHYSICIAN:  Zackery BarefootJ. Madison Jahbari Repinski, MD  HISTORY OF PRESENT ILLNESS: The patient is a 44 year old white female who presented to the Emergency Room in respiratory distress. There was a concern as to whether or not the patient had an upper airway obstruction. She has been treated with albuterol and Solu-Medrol and has responded very well to that. Her temperature on presentation was 96.9. She was hypertensive, tachypneic, and tachycardic, but she has improved dramatically over the past an hour and a half to two hours.   ALLERGIES, MEDICATIONS, PAST MEDICAL HISTORY, PAST SURGICAL HISTORY: Reviewed and documented in the chart.   PHYSICAL EXAMINATION:  GENERAL: The patient is on BiPAP, acknowledges that she is feeling a lot better, breathing a lot better. She seems fairly comfortable on the BiPAP and does not have any inspiratory or expiratory stridor.  The trachea is midline. There is mild tenderness to palpation of the trachea. No discrete masses or lesions palpable in the neck.   Flexible fiberoptic laryngoscopy reveals normal-appearing nasopharynx without masses or lesions at the fossa of Rosenmuller. The epiglottis is normal. There is mild erythema at the vocal cords. The vocal cords abduct and adduct normally. There is very mild erythema of the interarytenoid mucosa. There are no other endolaryngeal masses or lesions. The subglottis appears patent but there is limited visibility of the subglottic area.   IMPRESSION: Possible tracheitis in addition to an apparent asthma exacerbation.   RECOMMENDATIONS: I would recommend covering for staphylococcus aureus, which is the usual pathogen for bacterial tracheitis, although certainly given the fact that the patient's temperature has been essentially normal I would expect her to be much more toxic  than she is now. In any case, coverage of staphylococcus aureus would be recommended as would continuation of the IV steroids. A CT scan of the neck and chest was ordered to cover the trachea down to the carina, but unfortunately both CT scanners at the hospital are out of order and there will be no CT scans available until the morning. Since the patient is clinically improving and she can be observed in the Intensive Care Unit and I have reviewed the plain film x-rays, I can do a tracheostomy if necessary if the patient would not be able to be intubated. But certainly at this point she does not appear to need to be intubated and therefore we will continue to observe with the above recommendations. These findings and the case in general have been discussed with Dr. Cherlynn KaiserSainani, who will be admitting the patient, and Dr. Carollee MassedKaminski in the Emergency Room.      ____________________________ Shela CommonsJ. Gertie BaronMadison Tenecia Ignasiak, MD [email or web address] D: 06/20/2012 21:14:15 ET T: 06/21/2012 08:23:37 ET JOB#: [id number]  cc: Zackery BarefootJ. Madison Rhylei Mcquaig, MD, <Dictator> Glorious PeachSonya Thompson -  ENT Wendee CoppJMADISON Buffie Herne MD ELECTRONICALLY SIGNED 07/02/2012 7:38

## 2015-01-24 NOTE — Discharge Summary (Signed)
PATIENT NAME:  Karla Hoover, Karla Hoover MR#:  [id number] DATE OF BIRTH:  July 12, 1971  DATE OF ADMISSION:  06/20/2012 DATE OF DISCHARGE:  06/23/2012  PRIMARY CARE PHYSICIAN: Non-local  CONSULTING PHYSICIANS: 1. ENT, Dr. Chestine Sporelark 2. Pulmonary, Dr. Welton FlakesKhan  DISCHARGE DIAGNOSES:  1. Acute respiratory failure.  2. Chronic obstructive pulmonary disease exacerbation.  3. Tobacco abuse. 4. Hyperglycemia.  5. Hypertension.  6. Diabetes.   CONDITION: Stable.   CODE STATUS: FULL CODE.   HOME MEDICATIONS:  1. Lisinopril 20 mg p.o. daily.  2. Spiriva 18-mcg inhalation capsule, one cap once daily.  3. Zithromax 500 mg p.o. daily for five days.  4. Prednisone 40 mg p.o. daily for two days, then 20 mg p.o. daily for two days, then 10 mg p.o. daily for two days.  5. DuoNeb CFC 100 mcg/220 mcg inhalation, 1 puff every six hours p.r.n. for shortness of breath.  6. Xanax 0.25 mg p.o. every eight hours p.r.n. for anxiety, nervousness for seven days.  DIET: Low sodium, low fat, low cholesterol, ADA diet.   ACTIVITY: As tolerated.  FOLLOW-UP CARE: Follow up with PCP within one week. The patient needs followup with his PCP for diabetes control. In addition, the patient needs to follow up with Dr. Welton FlakesKhan as outpatient.   REASON FOR ADMISSION: Shortness of breath.   HOSPITAL COURSE: The patient is a 44 year old Caucasian female with history of chronic obstructive pulmonary disease, hypertension, hyperlipidemia, and noncompliance who presents to the ED with shortness of breath for two days, but getting worse progressively. In the ED she was noted to be in acute respiratory failure and have end expiratory stridor.  The patient was urgently seen by ENT, Dr. Kemper Durielarke, who performed urgent laryngoscopy which showed no acute abnormality except some mild erythema.  CT scan of the neck showed soft tissue of her neck.  The patient was placed on BiPAP and admitted to the Critical Care Unit. For detailed history and physical  examination, please refer to the admission note dictated by Dr. Cherlynn KaiserSainani. On admission laboratory data showed troponin less than 0.02. ABG showed pH 7.37, pCO2 43, pO2 84 on BiPAP. After admission the patient was treated with IV Solu-Medrol and DuoNebs around the clock.  In addition, she has been treated with Advair, Spiriva, Zithromax, and Rocephin for possible bronchitis, trachitis. Two days after above-mentioned treatment the patient was off BiPAP and transferred to the floor. The patient's symptoms have much improved. No cough, sputum, or shortness of breath today. Physical examination showed no wheezing or crackles. However, the patient's blood sugar went up to more than 400, which is possibly due to steroids and possibly due to underlying diabetes. The patient was treated with sliding scale. Blood sugar decreased to about 300. In addition, the patient has a history of hypertension, which has been treated with lisinopril. The patient has a history of tobacco abuse, for which she was counseled on smoking cessation, but the patient declined nicotine patch. Generally the patient is clinically stable and will be discharged to home today. Discussed the patient's discharge plan with the patient, nurse, and the case manager.   TIME SPENT: About 38 minutes.   ____________________________ Shaune PollackQing Christalynn Boise, MD qc:bjt D: 06/23/2012 18:07:04 ET T: 06/24/2012 12:02:34 ET JOB#: [id number]  cc: Shaune PollackQing Otto Felkins, MD, <Dictator> Shaune PollackQING Calub Tarnow MD ELECTRONICALLY SIGNED 06/25/2012 14:50

## 2015-01-24 NOTE — Consult Note (Signed)
Brief Consult Note: Diagnosis: acute resp failure.   Patient was seen by consultant.   Consult note dictated.   Recommend further assessment or treatment.  Electronic Signatures: Khan, Saadat A (MD)  (Signed 16-Sep-13 09:52)  Authored: Brief Consult Note   Last Updated: 16-Sep-13 09:52 by Khan, Saadat A (MD) 

## 2015-01-24 NOTE — H&P (Signed)
PATIENT NAME:  Karla Hoover, Karla C MR#:  [id number] DATE OF BIRTH:  1971/07/06  DATE OF ADMISSION:  06/20/2012  PRIMARY CARE PHYSICIAN: Does not have one.   CHIEF COMPLAINT: Shortness of breath.   HISTORY OF PRESENT ILLNESS: This is a 44 year old female who presents to the Emergency Room due to progressive shortness of breath ongoing for the past two days. Patient says that she has a history of asthma/chronic obstructive pulmonary disease and has been having trouble breathing for the past two days, progressively getting worse. The shortness of breath has gotten worse even on minimal exertion. She also admits to a couple episodes of nausea, vomiting and increasing cough with productive sputum which is yellow in color. She also admits to low-grade fevers. She also complains of a slight pleuritic pain on the left side of her chest. Since patient's symptoms are not getting better she came to the ER for further evaluation and was noted to be in acute respiratory failure and also noted to have an end expiratory stridor. Patient was urgently seen by ENT with Dr. Gertie BaronMadison Clark who performed an urgent laryngoscopy on her which showed no acute abnormalities except for some mild erythema. We could not obtain a CT scan of her soft tissue of her neck or her chest given the fact that our CT scanner is down yet she continues to be significantly short of breath and is on BiPAP. Hospitalist services were contacted for further admission.   REVIEW OF SYSTEMS: CONSTITUTIONAL: No documented fever. No weight gain. No weight loss. EYES: No blurred or double vision. ENT: No tinnitus. No postnasal drip. Positive redness of the oropharynx. RESPIRATORY: Positive cough. Positive wheeze. No hemoptysis. Positive dyspnea. CARDIOVASCULAR: Positive chest pain. No orthopnea, no palpitations, no syncope. GASTROINTESTINAL: Positive nausea. Positive vomiting. No diarrhea, no abdominal pain, no melena, no hematochezia. GENITOURINARY: No dysuria,  no hematuria. ENDOCRINE: No polyuria or nocturia. No heat or heat or cold intolerance. HEME: No anemia, no bruising, no bleeding. INTEGUMENTARY: No rashes. No lesions. MUSCULOSKELETAL: No arthritis, no swelling, no gout. NEUROLOGIC: No numbness, no tingling, no ataxia, no seizure-type activity. PSYCH: No anxiety, no insomnia, no ADD.   PAST MEDICAL HISTORY:  1. Chronic obstructive pulmonary disease/asthma. 2. Hypertension. 3. Hyperlipidemia. 4. Medical noncompliance.   ALLERGIES: Levaquin which causes itching.   SOCIAL HISTORY: Still smokes about a pack per day. No alcohol abuse. No illicit drug abuse. Lives at home with her stepdaughter and stepson.   CURRENT MEDICATIONS: She is currently on no medications.   PHYSICAL EXAMINATION ON ADMISSION:  VITAL SIGNS: Patient's vital signs are noted to be: Temperature 96.9, pulse 114, respirations 22, blood pressure 178/98, sats 99% on BiPAP.   GENERAL: She is a pleasant appearing female in mild to moderate respiratory distress.   HEENT: She is atraumatic, normocephalic. Her extraocular muscles are intact. Her pupils are equal, reactive to light. Sclerae anicteric. No conjunctival injection. No pharyngeal erythema.   NECK: Supple. There is no jugular venous distention. No bruits, no lymphadenopathy, no thyromegaly.   HEART: Regular rate and rhythm. Tachycardic. No murmurs, no rubs, no clicks.   LUNGS: She has some coarse diffuse wheezing and rhonchi bilaterally but good air entry bilaterally. Positive use of accessory muscles. No dullness to percussion.   ABDOMEN: Soft, flat, nontender, nondistended. Has good bowel sounds. No hepatosplenomegaly appreciated.   EXTREMITIES: No evidence of any cyanosis, clubbing, or peripheral edema. Has +2 pedal and radial pulses bilaterally.   NEUROLOGIC: Patient is alert, awake, oriented x3 with no  focal motor or sensory deficits appreciated bilaterally.   SKIN: Moist, warm with no rash appreciated.    LYMPHATIC: There is no cervical or axillary lymphadenopathy.   LABORATORY, DIAGNOSTIC, AND RADIOLOGICAL DATA: Serum glucose 143, BUN 11, creatinine 0.9, sodium 140, potassium 4.1, chloride 104, bicarbonate 28. LFTs are within normal limits. Troponin less than 0.02. White cell count 12.7, hemoglobin 15.1, hematocrit 43.9, platelet count 268. ABG showed pH 7.37, pCO2 43, pO2 85, sats 98% on BiPAP. Patient's x-ray of the soft tissue of the neck showed soft tissue thickening of the region of the trachea at the level of the larynx, may be infectious or inflammatory, prominent lymphoid tissue at the base of tongue as well as prominent palatine tonsils.   ASSESSMENT AND PLAN: This is a 44 year old female with a history of asthma/chronic obstructive pulmonary disease, ongoing tobacco abuse, hypertension, hyperlipidemia, medical noncompliance presented to the hospital with shortness of breath and acute respiratory failure and also noted to have an end expiratory stridor.  1. Acute respiratory failure. Likely this is secondary to asthma/chronic obstructive pulmonary disease exacerbation from her ongoing tobacco abuse and possibly underlying tracheitis. For now I will start her on IV Solu-Medrol, continue DuoNebs around-the-clock. Start her back on her Advair and also her Spiriva. Also give empiric antibiotics to cover for tracheitis/bronchitis. I will choose ceftriaxone and Zithromax as patient is allergic to Levaquin. Patient is currently on BiPAP but clinically doing better. Will wean off BiPAP as tolerated and follow serial ABGs.  2. Asthma/chronic obstructive pulmonary disease exacerbation. This is likely secondary to medical noncompliance and ongoing tobacco abuse, possibly underlying tracheitis. Will continue IV Solu-Medrol, DuoNebs around-the-clock, empiric antibiotics with ceftriaxone and Zithromax, Advair, Spiriva, BiPAP support as mentioned above.  3. End expiratory stridor. Patient has already been seen  by ENT by Dr. Gertie Baron who performed a urgent laryngoscopy. Did not find any acute abnormalities other than some mild upper airway erythema. There is still a possibility of her having some tracheitis. We would like to have a better look at her chest with a CT of the neck and chest, although that cannot be done as the CT scanner is currently down and will be done tomorrow. Will empirically cover her for tracheitis with ceftriaxone and Zithromax. Dr. Gertie Baron to come and re-evaluate the patient tomorrow morning. Continue BiPAP support.  4. History of hypertension. Patient apparently used to be on lisinopril, I will restart that. Also place her on some p.r.n. hydralazine.  5. Ongoing tobacco abuse. Place her on a nicotine patch.  6. CODE STATUS: Patient is a FULL CODE.      TIME SPENT WITH ADMISSION: 50 minutes.   ____________________________ Rolly Pancake. Cherlynn Kaiser, MD vjs:cms D: 06/20/2012 21:35:08 ET T: 06/21/2012 06:51:37 ET JOB#: [id number]  cc: Rolly Pancake. Cherlynn Kaiser, MD, <Dictator> Houston Siren MD ELECTRONICALLY SIGNED 06/22/2012 20:25

## 2015-01-24 NOTE — Consult Note (Signed)
PATIENT NAME:  Karla Hoover, Tawni C MR#:  [id number] DATE OF BIRTH:  October 21, 1970  DATE OF CONSULTATION:  06/22/2012  CONSULTING PHYSICIAN:  Yevonne PaxSaadat A. Alexie Lanni, MD  REASON FOR CONSULTATION: Acute respiratory failure.   HISTORY OF PRESENT ILLNESS: The patient is a 44 year old rather anxious white female who presents to the hospital with increasing shortness of breath, cough. She was seen initially by ENT and evaluated for vocal cords which turned out to be fine after a laryngoscopy was done. There was a little bit of erythema, and she was started on BiPAP. After the BiPAP was started, she actually showed some improvement and there was some improvement in her wheezing. She is having still a little bit of cough which has been going on all through the night; and because of the use of the BiPAP, they were able to stave off having to intubate her.   PAST MEDICAL HISTORY: Significant for: 1. Chronic obstructive pulmonary disease. 2. Smoking. 3. Hypertension. 4. Hyperlipidemia.   ALLERGIES: Levaquin.   SOCIAL HISTORY: As mentioned, positive for a pack a day smoker. No alcohol or drug use is noted.   MEDICATIONS: Medications are reviewed on the Electronic Medical Chart at this time.  REVIEW OF SYSTEMS: A complete 12-point review of systems was performed and was unremarkable other than what is noted above in the history of present illness.   PHYSICAL EXAMINATION:  VITAL SIGNS: At the time she was seen, the patient's temperature was 98.1, pulse 106, respiratory rate 22, blood pressure 165/93, saturations 94%.   NECK: Supple. No JVD. No adenopathy. No thyromegaly.   CHEST: Chest showed very coarse breath sounds, a few distant rhonchi.  HEART: S1, S2 is normal. Regular rhythm. No murmurs, rubs or gallops.   ABDOMEN: Soft, no hepatosplenomegaly, no rebound or rigidity.   NEUROLOGIC: The patient was alert and oriented,  moving all four extremities.   EXTREMITIES: Without cyanosis or clubbing. Pulses  equal. Gait was not checked.   SKIN: Without any acute rash.   LABORATORY, DIAGNOSTIC AND RADIOLOGICAL DATA: White count 8.1, hemoglobin 13.8, hematocrit is 40.6. BUN 11, creatinine 1.0, glucose 187. The patient did have an ABG on admission was that was 7.37/43/85. The last CT of the neck showed minimal prominence of the soft tissue of the posterior larynx. No pulmonary embolism. No acute infiltrates were noted.   IMPRESSION: Acute on chronic respiratory failure.   PLAN: The ENT situation is clear at this point. She has probably acute exacerbation of her bronchitis due to ongoing tobacco abuse. She will be continued on antibiotics and steroids. Treat the cough symptomatically. We will continue to monitor and follow the patient's overall progress.  Prognosis remains guarded.   ____________________________ Yevonne PaxSaadat A. Aniella Wandrey, MD sak:cbb D: 06/22/2012 14:39:50 ET T: 06/22/2012 15:47:28 ET JOB#: [id number]  cc: Yevonne PaxSaadat A. Ellamarie Naeve, MD, <Dictator> Yevonne PaxSAADAT A Malayia Spizzirri MD ELECTRONICALLY SIGNED 07/13/2012 18:36

## 2015-01-27 NOTE — Discharge Summary (Signed)
PATIENT NAME:  Karla Hoover, Karla Hoover MR#:  [id number] DATE OF BIRTH:  [birth year]  DATE OF ADMISSION:  11/29/2012 DATE OF DISCHARGE:  12/01/2012  PRIMARY CARE PHYSICIAN: [facility].   FINAL DIAGNOSES: 1.  Acute respiratory failure which resolved.  2.  Acute chronic obstructive pulmonary disease exacerbation.  3.  Malignant hypertension and tachycardia.  4.  Diabetes, uncontrolled.  5.  Hypertriglyceridemia.  6.  Obesity.   MEDICATIONS ON DISCHARGE: Include prednisone 5 mg 4 tablets day one, 3 tablets day two, 2 tablets day three, 1 tablet day four and five, lisinopril 20 mg daily, metformin 500 mg twice a day with meals, Tamiflu 75 mg every 12 hours until completed, Xanax 0.25 mg every 8 hours as needed for anxiety, albuterol ipratropium CFC 1 puff 4 times a day, Spiriva 1 inhalation daily,  diltiazem 180 mg per 24 hours 1 capsule daily, azithromycin 250 mg 1 tablet daily until completed, Flovent CFC 110 mcg/inhalation 1 puff twice a day, nicotine patch 14 mg to chest wall daily, albuterol ipratropium solution 3 mL inhaled 4 times daily.   DIET: Low sodium diet, carbohydrate-controlled diet.   ACTIVITY: As tolerated.   FOLLOW-UP:  [facility], first appointment as scheduled.   HOSPITAL COURSE: The patient was admitted 11/29/2012 and discharged 12/01/2012. She was admitted with shortness of breath, cough, sputum and wheezing for 10 days. She was admitted with acute respiratory failure, COPD exacerbation, leukocytosis, malignant hypertension, hyponatremia, diabetes, uncontrolled, hyperlipidemia, obesity and tobacco abuse. Initially admitted to CCU stepdown, was started on Solu-Medrol, Xopenex, Advair, Spiriva and was started on lisinopril and Norvasc.   LABORATORY AND RADIOLOGICAL DATA DURING THE HOSPITAL COURSE:  Included EKG that showed sinus tachycardia.   Glucose 285, BUN 16, creatinine 0.96, sodium 131, potassium 4.0, chloride 97, CO2 25. White blood cell count 15.5,  hemoglobin and hematocrit 14.7 and 43.5, platelet count of 267. Troponin negative.   Chest x-ray: Mild basilar opacities likely secondary to atelectasis.   Influenza A and B negative. Triglycerides 518, HDL 33, total cholesterol 098. LDL could not be calculated secondary to high triglycerides. Hemoglobin A1c 8.3.   HOSPITAL COURSE PER PROBLEM LIST:  1.  For the patient's acute respiratory failure, this had resolved upon discharge. The patient was on room air and breathing comfortably. Pulse oximetry 96% on room air.  2.  For the patient's chronic obstructive pulmonary disease exacerbation, initially started on high dose Solu-Medrol and antibiotic. The patient was discharged home on prednisone taper and finish out the course of Zithromax. She was given a Combivent CFC inhaler and DuoNeb nebulizer solution, started on Flonase. The patient requested Spiriva, but I am not sure if she will be able to afford the cost. She was empirically given a flu treatment with Tamiflu.  3.  For malignant hypertension and tachycardia, I believe this is secondary to the patient to being unable to breathe. She was started on lisinopril and Cardizem CD. Blood pressure upon discharge was varied between 126/79, 153/88, pulse ranged between 96 and 114. The Cardizem was needed for heart rate control.  4.  For the patient's diabetes, this is uncontrolled. Hemoglobin A1c was elevated at 8.3. The patient is overweight. Low carbohydrate diet discussed at length, weight loss and exercise. The patient does drink a lot of soda. I advised her to cut out the soda completely, but she can have one soda a day but it has to be diet. She was started on metformin for this. With the steroids, the sugars will  be higher. I did give her Lantus while in the hospital, but since the hemoglobin A1c was only 8.3, I figure metformin will be good enough for right now. Follow up as outpatient. Consider titration of metformin if able to tolerate without GI  symptoms.  5.  Hypertriglyceridemia.  Low cholesterol and carbohydrate diet discussed. Weight loss recommended.  6.  Obesity, BMI is 38.9. Weight loss recommended. Patient was also counseled on low salt diet for her hypertension.   TIME SPENT ON DISCHARGE: 35 minutes.     ____________________________ Herschell Dimesichard J. Renae GlossWieting, MD rjw:cc D: 12/02/2012 16:46:13 ET T: 12/02/2012 18:45:16 ET JOB#: [id number]  cc: Herschell Dimesichard J. Renae GlossWieting, MD, <Dictator> [facility] Salley ScarletICHARD J Yomayra Tate MD ELECTRONICALLY SIGNED 12/08/2012 13:13

## 2015-01-27 NOTE — H&P (Signed)
PATIENT NAME:  Karla Hoover, Karla Hoover MR#:  [id number] DATE OF BIRTH:  11/24/1970  DATE OF ADMISSION:  11/29/2012  PRIMARY CARE PHYSICIAN:  None local.    REFERRING PHYSICIAN:  Glennie IsleSheryl Gottlieb, MD   CHIEF COMPLAINT: Shortness of breath, cough, sputum and wheezing for the past 10 days.   HISTORY OF PRESENT ILLNESS: A 44 year old Caucasian female with a history of hypertension, diabetes, COPD, hyperlipidemia, psoriasis, medical noncompliance, presented to the ED with the above chief complaint. The patient is alert, awake, oriented, in respiratory distress. According to the patient, the patient got a cold about 8 days ago but has already started cough, sputum, shortness of breath and wheezing for the past 10 days. The patient came to the ED about 5 days ago, was advised to be hospitalized, but the patient refused to be hospitalized and went back to home. The patient's symptoms have been getting worse, so the patient came back for further evaluation. The patient also complains of left flank pain while coughing. The patient was noted to have a high blood pressure, more than 200/120, heart rate is 120 to 130. The patient was treated with Xopenex in the ED, but still had shortness of breath, wheezing, using accessory muscles to breathe. The patient is not on any hypertensive medication.   PAST MEDICAL HISTORY: Hypertension, diabetes, COPD, hyperlipidemia, psoriasis.   SOCIAL HISTORY: The patient still smoking 1 pack a day for many years. Denies any alcohol drinking or illicit drugs.   ALLERGIES: LEVAQUIN.   MEDICATIONS: Albuterol CFC-free 90 mcg inhalation 2 puffs 4 times a day p.r.n., AccuNeb 1.5 mg/3 mL nebulization q. 4 hours.    REVIEW OF SYSTEMS: CONSTITUTIONAL: The patient has a fever, chills, headache, dizziness and weakness.  EYES: No double vision or blurred vision.   ENT: No postnasal drip, slurred speech or dysphagia. No epistaxis, but has some stridor.  CARDIOVASCULAR: No chest pain,  palpitations, orthopnea or nocturnal dyspnea. No leg edema.  PULMONARY: Positive for cough, sputum, shortness of breath, wheezing. No hemoptysis.  GASTROINTESTINAL: Positive for left-sided flank pain due to cough, but no obvious nausea, vomiting, diarrhea. No melena or bloody stool.  GENITOURINARY: No dysuria, hematuria or incontinence.  SKIN: No rash or jaundice.  NEUROLOGY: No syncope, loss of consciousness or seizure.  HEMATOLOGY: No easy bruising, bleeding.  ENDOCRINE: No polyuria, polydipsia, heat or cold intolerance.  MUSCULOSKELETAL: No joint pain. No edema.  PSYCHIATRIC: Has anxiety, but no depression.   PHYSICAL EXAMINATION: VITALS: Temperature 98.1, blood pressure was 228/111 now is 201/120, pulse is 130, O2 saturation is 97% on Ventimask, respirations 24.  GENERAL: The patient is alert, awake, oriented, in respiratory distress.  HEENT: Pupils round, equal, reactive to light and accommodation. Moist oral mucosa. Clear oropharynx.  NECK: Supple. No JVD or carotid bruit. No lymphadenopathy. No thyromegaly.  CARDIOVASCULAR: S1, S2 regular rate, but tachycardia. No murmurs or gallops.  PULMONARY: Bilateral air entry, severe expiratory wheezing, but no crackles. No rales. The patient has use of accessory muscles to breathe.  ABDOMEN: Soft. No distention or tenderness. No organomegaly. Bowel sounds present, but has obesity.  EXTREMITIES: No edema, clubbing or cyanosis. No calf tenderness. Strong bilateral pedal pulses.  SKIN: There is some rash on the skin due to  psoriasis. No bruises, no jaundice.  NEUROLOGIC: A and O x3. No focal deficit. Power 5/5. Sensation intact.   LABORATORY DATA: Troponin 0.02, CK 61, CK-MB less than 0.5. WBC 15.5, hemoglobin 14.7, platelets 267. Glucose 285, BUN 16, creatinine 0.96, sodium 131,  potassium 4.0, chloride 97. Urinalysis is negative.   Chest x-ray:  COPD changes. No infiltrate.  EKG showed sinus tachycardia at 128 BPM.   IMPRESSION: 1.  Acute  respiratory failure.  2.  Chronic obstructive pulmonary disease exacerbation.  3.  Leukocytosis.  4.  Malignant hypertension.  5.  Hyponatremia, possibly due to high blood sugar.  6.  Diabetes, uncontrolled.  7.  Hyperlipidemia.  8.  Obesity.  9.  Tobacco abuse.   PLAN OF TREATMENT: 1.  The patient will be admitted to stepdown unit, we will start solumedrol, Xopenex, Advair, Spiriva, and follow up CBC.  2.  For malignant hypertension, we will give hydralazine IV p.r.n. and start lisinopril and Norvasc. May need any Cardizem drip, if blood pressure is still not controlled.  3.  For diabetes, we will start sliding scale. Check hemoglobin A1c and lipid panel.  4.  For tobacco abuse, smoking cessation was counseled for 5 min.  We will give nicotine patch. I discussed the patient's critical condition with the patient and the family. Discussed the patient's code status. The patient wants FULL CODE.   CRITICAL CARE TIME SPENT: About 67 minutes.    ____________________________ Shaune Pollack, MD qc:cc D: 11/29/2012 21:40:35 ET T: 11/29/2012 23:12:25 ET JOB#: [id number]  cc: Shaune Pollack, MD, <Dictator> Shaune Pollack MD ELECTRONICALLY SIGNED 11/30/2012 16:44

## 2015-02-27 ENCOUNTER — Encounter: Payer: Self-pay | Admitting: Urgent Care

## 2015-02-27 ENCOUNTER — Emergency Department: Payer: Self-pay

## 2015-02-27 DIAGNOSIS — H538 Other visual disturbances: Secondary | ICD-10-CM | POA: Insufficient documentation

## 2015-02-27 DIAGNOSIS — J441 Chronic obstructive pulmonary disease with (acute) exacerbation: Secondary | ICD-10-CM | POA: Insufficient documentation

## 2015-02-27 DIAGNOSIS — E1165 Type 2 diabetes mellitus with hyperglycemia: Secondary | ICD-10-CM | POA: Insufficient documentation

## 2015-02-27 DIAGNOSIS — Z720 Tobacco use: Secondary | ICD-10-CM | POA: Insufficient documentation

## 2015-02-27 DIAGNOSIS — I10 Essential (primary) hypertension: Secondary | ICD-10-CM | POA: Insufficient documentation

## 2015-02-27 NOTE — ED Notes (Signed)
Patient presents to the ED with multiple c/o. Patient SOB with c/o pain "beside my spine on the RIGHT." Patient reporting uncontrolled CBGs at home with (+) vision changes noted. Patient reports that she just feels weak and tired.

## 2015-02-28 ENCOUNTER — Emergency Department
Admission: EM | Admit: 2015-02-28 | Discharge: 2015-02-28 | Disposition: A | Discharge status: Home / Self Care | Payer: Self-pay | Attending: Emergency Medicine | Admitting: Emergency Medicine

## 2015-02-28 DIAGNOSIS — H538 Other visual disturbances: Secondary | ICD-10-CM

## 2015-02-28 DIAGNOSIS — J441 Chronic obstructive pulmonary disease with (acute) exacerbation: Secondary | ICD-10-CM

## 2015-02-28 DIAGNOSIS — R739 Hyperglycemia, unspecified: Secondary | ICD-10-CM

## 2015-02-28 HISTORY — DX: Unspecified asthma, uncomplicated: J45.909

## 2015-02-28 HISTORY — DX: Chronic obstructive pulmonary disease, unspecified: J44.9

## 2015-02-28 HISTORY — DX: Essential (primary) hypertension: I10

## 2015-02-28 HISTORY — DX: Type 2 diabetes mellitus without complications: E11.9

## 2015-02-28 LAB — CBC
HEMATOCRIT: 46.1 % (ref 35.0–47.0)
Hemoglobin: 15.3 g/dL (ref 12.0–16.0)
MCH: 28.5 pg (ref 26.0–34.0)
MCHC: 33.3 g/dL (ref 32.0–36.0)
MCV: 85.5 fL (ref 80.0–100.0)
PLATELETS: 240 10*3/uL (ref 150–440)
RBC: 5.39 MIL/uL — AB (ref 3.80–5.20)
RDW: 13.7 % (ref 11.5–14.5)
WBC: 10.9 10*3/uL (ref 3.6–11.0)

## 2015-02-28 LAB — BASIC METABOLIC PANEL
Anion gap: 8 (ref 5–15)
BUN: 13 mg/dL (ref 6–20)
CHLORIDE: 99 mmol/L — AB (ref 101–111)
CO2: 27 mmol/L (ref 22–32)
Calcium: 9.2 mg/dL (ref 8.9–10.3)
Creatinine, Ser: 0.9 mg/dL (ref 0.44–1.00)
GFR calc non Af Amer: >60 mL/min (ref >60)
Glucose, Bld: 343 mg/dL — ABNORMAL HIGH (ref 65–99)
Potassium: 3.8 mmol/L (ref 3.5–5.1)
Sodium: 134 mmol/L — ABNORMAL LOW (ref 135–145)

## 2015-02-28 LAB — TROPONIN I: Troponin I: 0.03 ng/mL (ref <0.031)

## 2015-02-28 LAB — GLUCOSE, CAPILLARY: Glucose-Capillary: 283 mg/dL — ABNORMAL HIGH (ref 65–99)

## 2015-02-28 MED ORDER — IPRATROPIUM-ALBUTEROL 0.5-2.5 (3) MG/3ML IN SOLN
3.0000 mL | Freq: Once | RESPIRATORY_TRACT | Status: AC
  Administered 2015-02-28: 3 mL via RESPIRATORY_TRACT

## 2015-02-28 MED ORDER — SODIUM CHLORIDE 0.9 % IV BOLUS (SEPSIS)
1000.0000 mL | Freq: Once | INTRAVENOUS | Status: AC
  Administered 2015-02-28: 1000 mL via INTRAVENOUS

## 2015-02-28 MED ORDER — METHYLPREDNISOLONE SODIUM SUCC 125 MG IJ SOLR
INTRAMUSCULAR | Status: AC
  Administered 2015-02-28: 125 mg via INTRAVENOUS
  Filled 2015-02-28: qty 2

## 2015-02-28 MED ORDER — METHYLPREDNISOLONE SODIUM SUCC 125 MG IJ SOLR
125.0000 mg | Freq: Once | INTRAMUSCULAR | Status: AC
  Administered 2015-02-28: 125 mg via INTRAVENOUS

## 2015-02-28 MED ORDER — ALBUTEROL SULFATE HFA 108 (90 BASE) MCG/ACT IN AERS: 2.0000 | INHALATION_SPRAY | Freq: Four times a day (QID) | RESPIRATORY_TRACT | Status: DC | PRN

## 2015-02-28 MED ORDER — AZITHROMYCIN 250 MG PO TABS
ORAL_TABLET | ORAL | Status: AC
Start: 2015-02-28 — End: 2015-03-05

## 2015-02-28 MED ORDER — IPRATROPIUM-ALBUTEROL 0.5-2.5 (3) MG/3ML IN SOLN
RESPIRATORY_TRACT | Status: AC
  Administered 2015-02-28: 3 mL via RESPIRATORY_TRACT
  Filled 2015-02-28: qty 9

## 2015-02-28 NOTE — ED Notes (Signed)
Pt presenting to ED with c/o SOB, blurry vision, and increased CBG at home x2 weeks.  Pt states stress at home, stopped taking insulin during that time.  Pt has hx of COPD.

## 2015-02-28 NOTE — Discharge Instructions (Signed)
Chronic Obstructive Pulmonary Disease  Chronic obstructive pulmonary disease (COPD) is a common lung condition in which airflow from the lungs is limited. COPD is a general term that can be used to describe many different lung problems that limit airflow, including both chronic bronchitis and emphysema. If you have COPD, your lung function will probably never return to normal, but there are measures you can take to improve lung function and make yourself feel better.   CAUSES    Smoking (common).    Exposure to secondhand smoke.    Genetic problems.   Chronic inflammatory lung diseases or recurrent infections.  SYMPTOMS    Shortness of breath, especially with physical activity.    Deep, persistent (chronic) cough with a large amount of thick mucus.    Wheezing.    Rapid breaths (tachypnea).    Gray or bluish discoloration (cyanosis) of the skin, especially in fingers, toes, or lips.    Fatigue.    Weight loss.    Frequent infections or episodes when breathing symptoms become much worse (exacerbations).    Chest tightness.  DIAGNOSIS   Your health care provider will take a medical history and perform a physical examination to make the initial diagnosis. Additional tests for COPD may include:    Lung (pulmonary) function tests.   Chest X-ray.   CT scan.   Blood tests.  TREATMENT   Treatment available to help you feel better when you have COPD includes:    Inhaler and nebulizer medicines. These help manage the symptoms of COPD and make your breathing more comfortable.   Supplemental oxygen. Supplemental oxygen is only helpful if you have a low oxygen level in your blood.    Exercise and physical activity. These are beneficial for nearly all people with COPD. Some people may also benefit from a pulmonary rehabilitation program.  HOME CARE INSTRUCTIONS    Take all medicines (inhaled or pills) as directed by your health care provider.   Avoid over-the-counter medicines or cough syrups  that dry up your airway (such as antihistamines) and slow down the elimination of secretions unless instructed otherwise by your health care provider.    If you are a smoker, the most important thing that you can do is stop smoking. Continuing to smoke will cause further lung damage and breathing trouble. Ask your health care provider for help with quitting smoking. He or she can direct you to community resources or hospitals that provide support.   Avoid exposure to irritants such as smoke, chemicals, and fumes that aggravate your breathing.   Use oxygen therapy and pulmonary rehabilitation if directed by your health care provider. If you require home oxygen therapy, ask your health care provider whether you should purchase a pulse oximeter to measure your oxygen level at home.    Avoid contact with individuals who have a contagious illness.   Avoid extreme temperature and humidity changes.   Eat healthy foods. Eating smaller, more frequent meals and resting before meals may help you maintain your strength.   Stay active, but balance activity with periods of rest. Exercise and physical activity will help you maintain your ability to do things you want to do.   Preventing infection and hospitalization is very important when you have COPD. Make sure to receive all the vaccines your health care provider recommends, especially the pneumococcal and influenza vaccines. Ask your health care provider whether you need a pneumonia vaccine.   Learn and use relaxation techniques to manage stress.   Learn   going to whistle and breathe out (exhale) through the pursed lips for 2 seconds.   Diaphragmatic breathing. Start by putting one hand on your abdomen just above  your waist. Inhale slowly through your nose. The hand on your abdomen should move out. Then purse your lips and exhale slowly. You should be able to feel the hand on your abdomen moving in as you exhale.   Learn and use controlled coughing to clear mucus from your lungs. Controlled coughing is a series of short, progressive coughs. The steps of controlled coughing are:  1. Lean your head slightly forward.  2. Breathe in deeply using diaphragmatic breathing.  3. Try to hold your breath for 3 seconds.  4. Keep your mouth slightly open while coughing twice.  5. Spit any mucus out into a tissue.  6. Rest and repeat the steps once or twice as needed. SEEK MEDICAL CARE IF:   You are coughing up more mucus than usual.   There is a change in the color or thickness of your mucus.   Your breathing is more labored than usual.   Your breathing is faster than usual.  SEEK IMMEDIATE MEDICAL CARE IF:   You have shortness of breath while you are resting.   You have shortness of breath that prevents you from:  Being able to talk.   Performing your usual physical activities.   You have chest pain lasting longer than 5 minutes.   Your skin color is more cyanotic than usual.  You measure low oxygen saturations for longer than 5 minutes with a pulse oximeter. MAKE SURE YOU:   Understand these instructions.  Will watch your condition.  Will get help right away if you are not doing well or get worse. Document Released: 07/03/2005 Document Revised: 02/07/2014 Document Reviewed: 05/20/2013 [facility] Patient Information 2015 [place], [place]. This information is not intended to replace advice given to you by your health care provider. Make sure you discuss any questions you have with your health care provider.  Blurred Vision You have been seen today complaining of blurred vision. This means you have a loss of ability to see small details.  CAUSES  Blurred vision can be a symptom of  underlying eye problems, such as:  Aging of the eye (presbyopia).  Glaucoma.  Cataracts.  Eye infection.  Eye-related migraine.  Diabetes mellitus.  Fatigue.  Migraine headaches.  High blood pressure.  Breakdown of the back of the eye (macular degeneration).  Problems caused by some medications. The most common cause of blurred vision is the need for eyeglasses or a new prescription. Today in the emergency department, no cause for your blurred vision can be found. SYMPTOMS  Blurred vision is the loss of visual sharpness and detail (acuity). DIAGNOSIS  Should blurred vision continue, you should see your caregiver. If your caregiver is your primary care physician, he or she may choose to refer you to another specialist.  TREATMENT  Do not ignore your blurred vision. Make sure to have it checked out to see if further treatment or referral is necessary. SEEK MEDICAL CARE IF:  You are unable to get into a specialist so we can help you with a referral. SEEK IMMEDIATE MEDICAL CARE IF: You have severe eye pain, severe headache, or sudden loss of vision. MAKE SURE YOU:   Understand these instructions.  Will watch your condition.  Will get help right away if you are not doing well or get worse. Document Released: 09/26/2003 Document Revised: 12/16/2011 Document Reviewed:  04/27/2008 ExitCare Patient Information 2015 [place], [place]. This information is not intended to replace advice given to you by your health care provider. Make sure you discuss any questions you have with your health care provider.

## 2015-02-28 NOTE — ED Provider Notes (Signed)
[facility] Department Provider Note  ____________________________________________  Time seen: Approximately 4:20 AM  I have reviewed the triage vital signs and the nursing notes.   HISTORY  Chief Complaint Shortness of Breath; Hyperglycemia; Weakness; and Visual Field Change    HPI Karla Hoover is a 44 y.o. female comes in today complaining that her eyesight has been going bad for the past week, her diabetes has been getting worse and she has had some difficulty breathing. The patient reports that her eyesight is blurred and she is unable to see as far as she was able to in the past. The patient reports that she can see some things but has to be very large. The patient reports that she broke up with someone recently over the last 3 weeks and was in a depressed state where she was not taking her medications or taking care of herself. The patient reports that she started trying to take her medications this week but realized it wasn't getting any better.The patient reports that she recently moved here approximately 2 days ago and does not have a physician to see. The patient reports that some of her medications are empty and she has not filled them but she has been taking her metformin. Patient reports that her blood sugars have been in the 400s, she feels dehydrated and she has right lung pain.   Past Medical History  Diagnosis Date  . Diabetes mellitus without complication   . COPD (chronic obstructive pulmonary disease)   . Hypertension   . Asthma     Patient Active Problem List   Diagnosis Date Noted  . HYPERTENSION 05/04/2009  . CHRONIC OBSTRUCTIVE PULMONARY DISEASE 05/04/2009    History reviewed. No pertinent past surgical history.  Current Outpatient Rx  Name  Route  Sig  Dispense  Refill  . albuterol (PROVENTIL HFA;VENTOLIN HFA) 108 (90 BASE) MCG/ACT inhaler   Inhalation   Inhale 2 puffs into the lungs every 6 (six) hours as needed  for wheezing or shortness of breath.   1 Inhaler   0   . azithromycin (ZITHROMAX Z-PAK) 250 MG tablet      Take 2 tablets (500 mg) on  Day 1,  followed by 1 tablet (250 mg) once daily on Days 2 through 5.   6 each   0     Allergies Levaquin and Strawberry  No family history on file.  Social History History  Substance Use Topics  . Smoking status: Current Every Day Smoker -- 0.50 packs/day    Types: Cigarettes  . Smokeless tobacco: Not on file  . Alcohol Use: No    Review of Systems Constitutional: No fever/chills Eyes: Blurred vision ENT: sore throat. Cardiovascular:  chest pain. Respiratory:shortness of breath. Gastrointestinal: Nausea and vomiting Genitourinary: Negative for dysuria. Musculoskeletal: Negative for back pain. Skin: Negative for rash. Neurological: Headache  10-point ROS otherwise negative.  ____________________________________________   PHYSICAL EXAM:  VITAL SIGNS: ED Triage Vitals  Enc Vitals Group     BP 02/27/15 2325 172/133 mmHg     Pulse Rate 02/27/15 2325 118     Resp 02/27/15 2325 22     Temp 02/27/15 2325 98.9 F (37.2 C)     Temp Source 02/27/15 2325 Oral     SpO2 02/27/15 2325 97 %     Weight 02/27/15 2325 240 lb (108.863 kg)     Height 02/27/15 2325 5\' 6"  (1.676 m)     Head Cir --  Peak Flow --      Pain Score 02/27/15 2325 7     Pain Loc --      Pain Edu? --      Excl. in GC? --     Constitutional: Alert and oriented. Well appearing and in mild distress. Eyes: Conjunctivae are normal. PERRL. EOMI. funduscopic exam unremarkable Head: Atraumatic. Nose: No congestion/rhinnorhea. Mouth/Throat: Mucous membranes are moist.  Oropharynx non-erythematous. Cardiovascular: Tachycardia, regular rhythm. Grossly normal heart sounds.  Good peripheral circulation. Respiratory: Tachypnea with accessory muscle use, wheezes and rhonchi throughout all lung fields Gastrointestinal: Soft and nontender. No distention. Positive bowel  sounds. Genitourinary: Deferred Musculoskeletal: No lower extremity tenderness nor edema.  . Neurologic:  Normal speech and language. No gross focal neurologic deficits are appreciated.  Skin:  Skin is warm, dry and intact. No rash noted. Psychiatric: Mood and affect are normal.  ____________________________________________   LABS (all labs ordered are listed, but only abnormal results are displayed)  Labs Reviewed  BASIC METABOLIC PANEL - Abnormal; Notable for the following:    Sodium 134 (*)    Chloride 99 (*)    Glucose, Bld 343 (*)    All other components within normal limits  CBC - Abnormal; Notable for the following:    RBC 5.39 (*)    All other components within normal limits  GLUCOSE, CAPILLARY - Abnormal; Notable for the following:    Glucose-Capillary 283 (*)    All other components within normal limits  TROPONIN I   ____________________________________________  EKG  ED ECG REPORT I, Rebecka Apley, the attending physician, personally viewed and interpreted this ECG.   Date: 02/28/2015  EKG Time: 2342  Rate: 111  Rhythm: sinus tachycardia  Axis: Normal  Intervals:none  ST&T Change: None  ____________________________________________  RADIOLOGY  Chest x-ray: No active cardiopulmonary disease, hyperinflation ____________________________________________   PROCEDURES  Procedure(s) performed: None  Critical Care performed: No  ____________________________________________   INITIAL IMPRESSION / ASSESSMENT AND PLAN / ED COURSE  Pertinent labs & imaging results that were available during my care of the patient were reviewed by me and considered in my medical decision making (see chart for details).  The patient is a 44 year old female with a history of diabetes high blood pressure and COPD who has not been very compliant with her medications especially in the last few weeks. The patient has been having some blurring of her vision and reports her  blood sugars have been in the 400. The patient's vision is likely due to her hyperglycemia. I did perform a funduscopic exam which was unremarkable. I will give the patient some normal saline IV as well as 3 duo nebs since the patient does have some wheezing and reassess the patient.  The patient's wheezing is improved after DuoNeb and Solu-Medrol. The patient's blood sugar is in the 200s. I will discharge the patient home to follow-up with primary care physician. Also I feel that the patient's blurred vision is due to her hyperglycemia and uncontrolled diabetes. I will refer the patient to follow-up with ophthalmology for further evaluation of her vision changes. ____________________________________________   FINAL CLINICAL IMPRESSION(S) / ED DIAGNOSES  Final diagnoses:  Hyperglycemia  Chronic obstructive pulmonary disease with acute exacerbation  Blurred vision      Rebecka Apley, MD 02/28/15 [phone]

## 2015-02-28 NOTE — ED Notes (Signed)
Pt placed on 3L O2

## 2015-02-28 NOTE — ED Notes (Signed)
Lab called to report sample hemolysis. New sample and order required in order to process CBC. EDT made aware of need. Order placed by this RN.

## 2017-01-24 DIAGNOSIS — F419 Anxiety disorder, unspecified: Secondary | ICD-10-CM | POA: Diagnosis present

## 2018-02-15 DIAGNOSIS — F32A Depression, unspecified: Secondary | ICD-10-CM | POA: Diagnosis present

## 2019-08-25 ENCOUNTER — Emergency Department (HOSPITAL_COMMUNITY): Payer: Medicaid - Out of State

## 2019-08-25 ENCOUNTER — Emergency Department (HOSPITAL_COMMUNITY)
Admission: EM | Admit: 2019-08-25 | Discharge: 2019-08-25 | Disposition: A | Discharge status: Home / Self Care | Payer: Medicaid - Out of State | Attending: Emergency Medicine | Admitting: Emergency Medicine

## 2019-08-25 ENCOUNTER — Encounter (HOSPITAL_COMMUNITY): Payer: Self-pay | Admitting: Emergency Medicine

## 2019-08-25 ENCOUNTER — Other Ambulatory Visit: Payer: Self-pay

## 2019-08-25 DIAGNOSIS — R509 Fever, unspecified: Secondary | ICD-10-CM | POA: Diagnosis not present

## 2019-08-25 DIAGNOSIS — R0789 Other chest pain: Secondary | ICD-10-CM | POA: Insufficient documentation

## 2019-08-25 DIAGNOSIS — J45909 Unspecified asthma, uncomplicated: Secondary | ICD-10-CM | POA: Insufficient documentation

## 2019-08-25 DIAGNOSIS — R1084 Generalized abdominal pain: Secondary | ICD-10-CM | POA: Diagnosis not present

## 2019-08-25 DIAGNOSIS — J449 Chronic obstructive pulmonary disease, unspecified: Secondary | ICD-10-CM | POA: Diagnosis not present

## 2019-08-25 DIAGNOSIS — I10 Essential (primary) hypertension: Secondary | ICD-10-CM | POA: Insufficient documentation

## 2019-08-25 DIAGNOSIS — E119 Type 2 diabetes mellitus without complications: Secondary | ICD-10-CM | POA: Insufficient documentation

## 2019-08-25 DIAGNOSIS — R079 Chest pain, unspecified: Secondary | ICD-10-CM | POA: Diagnosis present

## 2019-08-25 DIAGNOSIS — Z8673 Personal history of transient ischemic attack (TIA), and cerebral infarction without residual deficits: Secondary | ICD-10-CM | POA: Insufficient documentation

## 2019-08-25 DIAGNOSIS — F17210 Nicotine dependence, cigarettes, uncomplicated: Secondary | ICD-10-CM | POA: Diagnosis not present

## 2019-08-25 HISTORY — DX: Cerebral infarction, unspecified: I63.9

## 2019-08-25 LAB — CBC
HCT: 42.7 % (ref 36.0–46.0)
Hemoglobin: 14.6 g/dL (ref 12.0–15.0)
MCH: 28.7 pg (ref 26.0–34.0)
MCHC: 34.2 g/dL (ref 30.0–36.0)
MCV: 84.1 fL (ref 80.0–100.0)
Platelets: 289 10*3/uL (ref 150–400)
RBC: 5.08 MIL/uL (ref 3.87–5.11)
RDW: 12.6 % (ref 11.5–15.5)
WBC: 9.3 10*3/uL (ref 4.0–10.5)
nRBC: 0 % (ref 0.0–0.2)

## 2019-08-25 LAB — BASIC METABOLIC PANEL
Anion gap: 10 (ref 5–15)
BUN: 12 mg/dL (ref 6–20)
CO2: 26 mmol/L (ref 22–32)
Calcium: 9.7 mg/dL (ref 8.9–10.3)
Chloride: 100 mmol/L (ref 98–111)
Creatinine, Ser: 0.92 mg/dL (ref 0.44–1.00)
GFR calc Af Amer: >60 mL/min (ref >60)
GFR calc non Af Amer: >60 mL/min (ref >60)
Glucose, Bld: 141 mg/dL — ABNORMAL HIGH (ref 70–99)
Potassium: 4.4 mmol/L (ref 3.5–5.1)
Sodium: 136 mmol/L (ref 135–145)

## 2019-08-25 LAB — I-STAT BETA HCG BLOOD, ED (MC, WL, AP ONLY): I-stat hCG, quantitative: <5 m[IU]/mL (ref <5)

## 2019-08-25 LAB — URINALYSIS, ROUTINE W REFLEX MICROSCOPIC
Bilirubin Urine: NEGATIVE
Glucose, UA: NEGATIVE mg/dL
Hgb urine dipstick: NEGATIVE
Ketones, ur: NEGATIVE mg/dL
Leukocytes,Ua: NEGATIVE
Nitrite: NEGATIVE
Protein, ur: 100 mg/dL — AB
Specific Gravity, Urine: 1.014 (ref 1.005–1.030)
pH: 6 (ref 5.0–8.0)

## 2019-08-25 LAB — TROPONIN I (HIGH SENSITIVITY)
Troponin I (High Sensitivity): 3 ng/L (ref <18)
Troponin I (High Sensitivity): 3 ng/L (ref <18)

## 2019-08-25 LAB — LACTIC ACID, PLASMA: Lactic Acid, Venous: 1.1 mmol/L (ref 0.5–1.9)

## 2019-08-25 MED ORDER — SODIUM CHLORIDE 0.9 % IV BOLUS
1000.0000 mL | Freq: Once | INTRAVENOUS | Status: AC
  Administered 2019-08-25: 1000 mL via INTRAVENOUS

## 2019-08-25 MED ORDER — FENTANYL CITRATE (PF) 100 MCG/2ML IJ SOLN
50.0000 ug | Freq: Once | INTRAMUSCULAR | Status: AC
  Administered 2019-08-25: 50 ug via INTRAVENOUS
  Filled 2019-08-25: qty 2

## 2019-08-25 MED ORDER — SODIUM CHLORIDE 0.9% FLUSH
3.0000 mL | Freq: Once | INTRAVENOUS | Status: AC
  Administered 2019-08-25: 3 mL via INTRAVENOUS

## 2019-08-25 MED ORDER — IOHEXOL 300 MG/ML  SOLN
100.0000 mL | Freq: Once | INTRAMUSCULAR | Status: AC | PRN
  Administered 2019-08-25: 100 mL via INTRAVENOUS

## 2019-08-25 NOTE — ED Triage Notes (Signed)
C/o fever, chills, SOB, and aching to center of chest since this morning.  Tested negative for COVID 2 weeks ago.

## 2019-08-25 NOTE — ED Notes (Signed)
Pt sister Olin Hauser [phone]

## 2019-08-25 NOTE — ED Notes (Signed)
Karla Hoover [phone] looking for an update about patient

## 2019-08-25 NOTE — ED Notes (Signed)
Patient verbalizes understanding of discharge instructions. Opportunity for questioning and answers were provided. Armband removed by staff, pt discharged from ED. Pt. In wheelchair and discharged home.  

## 2019-08-25 NOTE — ED Notes (Signed)
Attempted blood cultures on pt. No success.

## 2019-08-25 NOTE — ED Notes (Signed)
Family called for d/c. ETA 65min

## 2019-08-25 NOTE — Discharge Instructions (Addendum)
You were seen in the emergency department for chest pain and abdominal pain and feeling cold.  You had blood work EKG chest x-ray and a CAT scan of the abdomen pelvis did not show any serious findings.  His CT did show some findings of the uterus and cervix, and this will need to follow-up with your doctor and an ultrasound.  Please continue to monitor for other symptoms or return to the emergency department if any worsening symptoms.  Below is the report of the CAT scan. IMPRESSION:  1. No CT evidence for acute intra-abdominal or pelvic abnormality.  2. Fibroid uterus. Small amount of fluid in the cervical canal with  suspected 6 mm hyperenhancing mass within the upper cervix.  Correlation with nonemergent pelvic ultrasound is advised.  3. Atrophy and scarring of the right kidney

## 2019-08-25 NOTE — ED Provider Notes (Signed)
[facility] EMERGENCY DEPARTMENT Provider Note   CSN: [id number] Arrival date & time: 08/25/19  1412     History   Chief Complaint Chief Complaint  Patient presents with  . Chest Pain  . Fever    HPI Karla Hoover is a 48 y.o. female.  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  She is complaining of chills fatigue fever subjective and some pressure in her chest that began yesterday.  Some abdominal discomfort.  No nausea vomiting diarrhea.  Has had urine infections recently is not sure if she is cleared them.  No new neuro deficits.  No rashes swollen joints.  No sick contacts or recent travel.  Last Covid test was 2 weeks ago.     The history is provided by the patient.  Chest Pain Pain location:  Substernal area Pain quality: pressure   Pain radiates to:  Does not radiate Pain severity:  Moderate Onset quality:  Gradual Duration:  24 hours Timing:  Constant Progression:  Unchanged Chronicity:  New Context: at rest   Relieved by:  None tried Worsened by:  Nothing Ineffective treatments:  None tried Associated symptoms: abdominal pain, fever and shortness of breath   Associated symptoms: no altered mental status, no cough, no diaphoresis, no headache, no nausea and no vomiting   Risk factors: diabetes mellitus   Fever Associated symptoms: chest pain, chills and dysuria (?)   Associated symptoms: no cough, no headaches, no nausea, no rash, no sore throat and no vomiting     Past Medical History:  Diagnosis Date  . Asthma   . COPD (chronic obstructive pulmonary disease) (HCC)   . Diabetes mellitus without complication (HCC)   . Hypertension   . Stroke [facility])     Patient Active Problem List   Diagnosis Date Noted  . HYPERTENSION 05/04/2009  . CHRONIC OBSTRUCTIVE PULMONARY DISEASE 05/04/2009    History reviewed. No pertinent surgical history.   OB History   No obstetric history on  file.      Home Medications    Prior to Admission medications   Medication Sig Start Date End Date Taking? Authorizing Provider  albuterol (PROVENTIL HFA;VENTOLIN HFA) 108 (90 BASE) MCG/ACT inhaler Inhale 2 puffs into the lungs every 6 (six) hours as needed for wheezing or shortness of breath. 02/28/15   Rebecka Apley, MD    Family History No family history on file.  Social History Social History   Tobacco Use  . Smoking status: Current Every Day Smoker    Packs/day: 0.50    Types: Cigarettes  . Smokeless tobacco: Never Used  Substance Use Topics  . Alcohol use: No  . Drug use: No     Allergies   Levaquin [levofloxacin in d5w] and Strawberry extract   Review of Systems Review of Systems  Constitutional: Positive for chills and fever. Negative for diaphoresis.  HENT: Negative for sore throat.   Eyes: Negative for visual disturbance.  Respiratory: Positive for shortness of breath. Negative for cough.   Cardiovascular: Positive for chest pain.  Gastrointestinal: Positive for abdominal pain. Negative for nausea and vomiting.  Genitourinary: Positive for dysuria (?).  Musculoskeletal: Negative for neck pain.  Skin: Negative for rash.  Neurological: Negative for headaches.     Physical Exam Updated Vital Signs BP (!) 151/96 (BP Location: Right Arm)   Pulse 91   Temp 98.4 F (36.9 C) (Oral)   Resp 18  LMP 08/04/2019   SpO2 97%   Physical Exam Vitals signs and nursing note reviewed.  Constitutional:      General: She is not in acute distress.    Appearance: She is well-developed.  HENT:     Head: Normocephalic and atraumatic.  Eyes:     Conjunctiva/sclera: Conjunctivae normal.  Neck:     Musculoskeletal: Neck supple.  Cardiovascular:     Rate and Rhythm: Normal rate and regular rhythm.     Heart sounds: Normal heart sounds. No murmur.  Pulmonary:     Effort: Pulmonary effort is normal. No respiratory distress.     Breath sounds: Normal breath  sounds.  Abdominal:     Palpations: Abdomen is soft.     Tenderness: There is no abdominal tenderness.  Musculoskeletal:     Right lower leg: She exhibits no tenderness.     Left lower leg: She exhibits no tenderness.  Skin:    General: Skin is warm and dry.     Capillary Refill: Capillary refill takes less than 2 seconds.  Neurological:     Mental Status: She is alert.     Comments: She has weakness left arm left leg.  She says at baseline for her.      ED Treatments / Results  Labs (all labs ordered are listed, but only abnormal results are displayed) Labs Reviewed  BASIC METABOLIC PANEL - Abnormal; Notable for the following components:      Result Value   Glucose, Bld 141 (*)    All other components within normal limits  URINALYSIS, ROUTINE W REFLEX MICROSCOPIC - Abnormal; Notable for the following components:   Protein, ur 100 (*)    Bacteria, UA RARE (*)    All other components within normal limits  CULTURE, BLOOD (ROUTINE X 2)  CULTURE, BLOOD (ROUTINE X 2)  URINE CULTURE  CBC  LACTIC ACID, PLASMA  I-STAT BETA HCG BLOOD, ED ([facility], [facility], [facility] ONLY)  TROPONIN I (HIGH SENSITIVITY)  TROPONIN I (HIGH SENSITIVITY)    EKG EKG Interpretation  Date/Time:  Wednesday August 25 2019 14:42:35 EST Ventricular Rate:  91 PR Interval:  162 QRS Duration: 74 QT Interval:  362 QTC Calculation: 445 R Axis:   18 Text Interpretation: Normal sinus rhythm Low voltage QRS Cannot rule out Anterior infarct , age undetermined Abnormal ECG new low voltage from prior 5/16 Confirmed by Meridee ScoreButler, Jannine Abreu [phone]([id number]) on 08/25/2019 3:30:23 PM   Radiology Ct Abdomen Pelvis W Contrast  Result Date: 08/25/2019 CLINICAL DATA:  Generalized abdominal pain EXAM: CT ABDOMEN AND PELVIS WITH CONTRAST TECHNIQUE: Multidetector CT imaging of the abdomen and pelvis was performed using the standard protocol following bolus administration of intravenous contrast. CONTRAST:  100mL OMNIPAQUE IOHEXOL 300 MG/ML  SOLN  COMPARISON:  04/30/2010 FINDINGS: Lower chest: Lung bases demonstrate no acute consolidation or pleural effusion. The heart size is normal. Hepatobiliary: No focal liver abnormality is seen. No gallstones, gallbladder wall thickening, or biliary dilatation. Pancreas: Unremarkable. No pancreatic ductal dilatation or surrounding inflammatory changes. Spleen: Borderline enlarged. Adrenals/Urinary Tract: Adrenal glands are normal. Atrophy and cortical scarring right kidney. Cyst in the upper pole of the right kidney. No hydronephrosis. Bladder unremarkable Stomach/Bowel: Stomach is within normal limits. Appendix appears normal. No evidence of bowel wall thickening, distention, or inflammatory changes. Vascular/Lymphatic: Mild aortic atherosclerosis. No aneurysm. No significantly enlarged lymph nodes Reproductive: 1.7 cm hypoenhancing mass within the anterior wall of the uterus, likely a fibroid. Small amount of fluid in the endocervical canal. 6 mm  hyperenhancing nodular focus at the cervical endometrium best seen on sagittal views, series 7, image number 86. no adnexal mass. Other: Negative for free air. Tiny amount of fluid adjacent to the inferior right hepatic lobe. Musculoskeletal: No acute or significant osseous findings. IMPRESSION: 1. No CT evidence for acute intra-abdominal or pelvic abnormality. 2. Fibroid uterus. Small amount of fluid in the cervical canal with suspected 6 mm hyperenhancing mass within the upper cervix. Correlation with nonemergent pelvic ultrasound is advised. 3. Atrophy and scarring of the right kidney Electronically Signed   By: Donavan Foil M.D.   On: 08/25/2019 21:55   Dg Chest Portable 1 View  Result Date: 08/25/2019 CLINICAL DATA:  Chest pain and shortness of breath EXAM: PORTABLE CHEST 1 VIEW COMPARISON:  Feb 27, 2015. FINDINGS: There is atelectatic change in the left base. The lungs elsewhere are clear. Heart size and pulmonary vascularity are normal. No adenopathy. No  pneumothorax. There is degenerative change in the thoracic spine. IMPRESSION: Left base atelectasis. Lungs elsewhere are clear. Cardiac silhouette within normal limits. Electronically Signed   By: Lowella Grip III M.D.   On: 08/25/2019 17:08    Procedures Procedures (including critical care time)  Medications Ordered in ED Medications  sodium chloride flush (NS) 0.9 % injection 3 mL (3 mLs Intravenous Given 08/25/19 1739)  sodium chloride 0.9 % bolus 1,000 mL (0 mLs Intravenous Stopped 08/25/19 2109)  fentaNYL (SUBLIMAZE) injection 50 mcg (50 mcg Intravenous Given 08/25/19 2114)  iohexol (OMNIPAQUE) 300 MG/ML solution 100 mL (100 mLs Intravenous Contrast Given 08/25/19 2130)     Initial Impression / Assessment and Plan / ED Course  I have reviewed the triage vital signs and the nursing notes.  Pertinent labs & imaging results that were available during my care of the patient were reviewed by me and considered in my medical decision making (see chart for details).  Clinical Course as of Aug 25 942  Wed Nov 18, 669  [id number] 48 year old female with recent stroke here with fevers and chills chest discomfort pressure since yesterday.  EKG showing low voltage no acute changes.  Initial troponin III.  Differential includes ACS, PE, Covid, pneumonia, bronchitis, UTI, sepsis.  Afebrile here hypertensive sats 97%.  Did a bedside ultrasound that did not show any pericardial effusion.  Waiting on chest x-ray urinalysis delta troponin.  Ordered blood cultures and lactic acid.   [MB]  1838 Patient's work-up so far has been unremarkable.  Normal white count normal renal function lactic acid 1.1 normal.  Chest x-ray no infiltrates, atelectasis left base.  Initial troponin III delta troponin pending.  She said she urinated when she got here but no sample was collected so have asked the nurse to try to get a sample from her.  IV fluids infusing.   [MB]  2208 Patient CT with no acute findings although  they do comment upon some cervical uterine findings that will need an outpatient ultrasound.  I reviewed this with the patient and she is happy to go home and she said her sister is waiting for her in the parking lot.  She understands to keep an eye out for further symptoms and will return if any worsening   [MB]    Clinical Course User Index [MB] Hayden Rasmussen, MD   Karla Hoover was evaluated in Emergency Department on 08/25/2019 for the symptoms described in the history of present illness. She was evaluated in the context of the global COVID-19 pandemic, which necessitated consideration that  the patient might be at risk for infection with the SARS-CoV-2 virus that causes COVID-19. Institutional protocols and algorithms that pertain to the evaluation of patients at risk for COVID-19 are in a state of rapid change based on information released by regulatory bodies including the CDC and federal and state organizations. These policies and algorithms were followed during the patient's care in the ED.      Final Clinical Impressions(s) / ED Diagnoses   Final diagnoses:  Atypical chest pain  Generalized abdominal pain    ED Discharge Orders    None       Terrilee Files, MD 08/26/19 [phone]

## 2019-08-26 LAB — URINE CULTURE: Culture: NO GROWTH

## 2019-08-30 LAB — CULTURE, BLOOD (ROUTINE X 2)
Culture: NO GROWTH
Culture: NO GROWTH
Special Requests: ADEQUATE

## 2019-09-09 ENCOUNTER — Emergency Department (HOSPITAL_COMMUNITY)
Admission: EM | Admit: 2019-09-09 | Discharge: 2019-09-09 | Disposition: A | Discharge status: Home / Self Care | Payer: Medicaid - Out of State | Attending: Emergency Medicine | Admitting: Emergency Medicine

## 2019-09-09 ENCOUNTER — Emergency Department (HOSPITAL_COMMUNITY): Payer: Medicaid - Out of State

## 2019-09-09 ENCOUNTER — Other Ambulatory Visit: Payer: Self-pay

## 2019-09-09 DIAGNOSIS — F17210 Nicotine dependence, cigarettes, uncomplicated: Secondary | ICD-10-CM | POA: Diagnosis not present

## 2019-09-09 DIAGNOSIS — E1165 Type 2 diabetes mellitus with hyperglycemia: Secondary | ICD-10-CM | POA: Insufficient documentation

## 2019-09-09 DIAGNOSIS — R739 Hyperglycemia, unspecified: Secondary | ICD-10-CM

## 2019-09-09 DIAGNOSIS — G629 Polyneuropathy, unspecified: Secondary | ICD-10-CM

## 2019-09-09 DIAGNOSIS — N3001 Acute cystitis with hematuria: Secondary | ICD-10-CM | POA: Diagnosis not present

## 2019-09-09 DIAGNOSIS — Z79899 Other long term (current) drug therapy: Secondary | ICD-10-CM | POA: Insufficient documentation

## 2019-09-09 DIAGNOSIS — Z794 Long term (current) use of insulin: Secondary | ICD-10-CM | POA: Diagnosis not present

## 2019-09-09 DIAGNOSIS — R197 Diarrhea, unspecified: Secondary | ICD-10-CM | POA: Diagnosis not present

## 2019-09-09 LAB — CBC WITH DIFFERENTIAL/PLATELET
Abs Immature Granulocytes: 0.02 10*3/uL (ref 0.00–0.07)
Basophils Absolute: 0.1 10*3/uL (ref 0.0–0.1)
Basophils Relative: 1 %
Eosinophils Absolute: 0.2 10*3/uL (ref 0.0–0.5)
Eosinophils Relative: 2 %
HCT: 43.2 % (ref 36.0–46.0)
Hemoglobin: 14.2 g/dL (ref 12.0–15.0)
Immature Granulocytes: 0 %
Lymphocytes Relative: 29 %
Lymphs Abs: 3 10*3/uL (ref 0.7–4.0)
MCH: 28 pg (ref 26.0–34.0)
MCHC: 32.9 g/dL (ref 30.0–36.0)
MCV: 85.2 fL (ref 80.0–100.0)
Monocytes Absolute: 0.7 10*3/uL (ref 0.1–1.0)
Monocytes Relative: 7 %
Neutro Abs: 6.5 10*3/uL (ref 1.7–7.7)
Neutrophils Relative %: 61 %
Platelets: 234 10*3/uL (ref 150–400)
RBC: 5.07 MIL/uL (ref 3.87–5.11)
RDW: 12.5 % (ref 11.5–15.5)
WBC: 10.5 10*3/uL (ref 4.0–10.5)
nRBC: 0 % (ref 0.0–0.2)

## 2019-09-09 LAB — COMPREHENSIVE METABOLIC PANEL
ALT: 11 U/L (ref 0–44)
AST: 12 U/L — ABNORMAL LOW (ref 15–41)
Albumin: 3.6 g/dL (ref 3.5–5.0)
Alkaline Phosphatase: 73 U/L (ref 38–126)
Anion gap: 8 (ref 5–15)
BUN: 20 mg/dL (ref 6–20)
CO2: 28 mmol/L (ref 22–32)
Calcium: 9.2 mg/dL (ref 8.9–10.3)
Chloride: 98 mmol/L (ref 98–111)
Creatinine, Ser: 1.1 mg/dL — ABNORMAL HIGH (ref 0.44–1.00)
GFR calc Af Amer: >60 mL/min (ref >60)
GFR calc non Af Amer: 60 mL/min — ABNORMAL LOW (ref >60)
Glucose, Bld: 253 mg/dL — ABNORMAL HIGH (ref 70–99)
Potassium: 4.3 mmol/L (ref 3.5–5.1)
Sodium: 134 mmol/L — ABNORMAL LOW (ref 135–145)
Total Bilirubin: 0.8 mg/dL (ref 0.3–1.2)
Total Protein: 6.9 g/dL (ref 6.5–8.1)

## 2019-09-09 LAB — URINALYSIS, ROUTINE W REFLEX MICROSCOPIC
Bilirubin Urine: NEGATIVE
Glucose, UA: 50 mg/dL — AB
Ketones, ur: NEGATIVE mg/dL
Nitrite: NEGATIVE
Protein, ur: NEGATIVE mg/dL
Specific Gravity, Urine: 1.008 (ref 1.005–1.030)
pH: 5 (ref 5.0–8.0)

## 2019-09-09 LAB — TROPONIN I (HIGH SENSITIVITY): Troponin I (High Sensitivity): 4 ng/L (ref <18)

## 2019-09-09 LAB — LIPASE, BLOOD: Lipase: 26 U/L (ref 11–51)

## 2019-09-09 LAB — CBG MONITORING, ED
Glucose-Capillary: 248 mg/dL — ABNORMAL HIGH (ref 70–99)
Glucose-Capillary: 161 mg/dL — ABNORMAL HIGH (ref 70–99)

## 2019-09-09 MED ORDER — MIRTAZAPINE 45 MG PO TABS: 45.0000 mg | ORAL_TABLET | Freq: Every day | ORAL | 0 refills | Status: DC

## 2019-09-09 MED ORDER — GABAPENTIN 300 MG PO CAPS
300.0000 mg | ORAL_CAPSULE | Freq: Once | ORAL | Status: AC
  Administered 2019-09-09: 300 mg via ORAL
  Filled 2019-09-09: qty 1

## 2019-09-09 MED ORDER — INSULIN LISPRO 100 UNIT/ML ~~LOC~~ SOLN: 1.0000 [IU] | Freq: Three times a day (TID) | SUBCUTANEOUS | 0 refills | Status: DC | PRN

## 2019-09-09 MED ORDER — OMEPRAZOLE 20 MG PO CPDR: 20.0000 mg | DELAYED_RELEASE_CAPSULE | Freq: Every day | ORAL | 0 refills | Status: DC

## 2019-09-09 MED ORDER — ONDANSETRON HCL 4 MG/2ML IJ SOLN
4.0000 mg | Freq: Once | INTRAMUSCULAR | Status: AC
  Administered 2019-09-09: 4 mg via INTRAVENOUS
  Filled 2019-09-09: qty 2

## 2019-09-09 MED ORDER — CEPHALEXIN 500 MG PO CAPS: 500.0000 mg | ORAL_CAPSULE | Freq: Three times a day (TID) | ORAL | 0 refills | Status: AC

## 2019-09-09 MED ORDER — INSULIN GLARGINE 100 UNIT/ML ~~LOC~~ SOLN: 22.0000 [IU] | Freq: Every day | SUBCUTANEOUS | 0 refills | Status: DC

## 2019-09-09 MED ORDER — LOSARTAN POTASSIUM 100 MG PO TABS: 100.0000 mg | ORAL_TABLET | Freq: Every day | ORAL | 0 refills | Status: DC

## 2019-09-09 MED ORDER — SODIUM CHLORIDE 0.9 % IV BOLUS
500.0000 mL | Freq: Once | INTRAVENOUS | Status: AC
  Administered 2019-09-09: 500 mL via INTRAVENOUS

## 2019-09-09 MED ORDER — FAMOTIDINE IN NACL 20-0.9 MG/50ML-% IV SOLN
20.0000 mg | Freq: Once | INTRAVENOUS | Status: AC
  Administered 2019-09-09: 20 mg via INTRAVENOUS
  Filled 2019-09-09: qty 50

## 2019-09-09 MED ORDER — NIFEDIPINE ER OSMOTIC RELEASE 30 MG PO TB24: 30.0000 mg | ORAL_TABLET | Freq: Every day | ORAL | 0 refills | Status: DC

## 2019-09-09 MED ORDER — PEN NEEDLES 32G X 4 MM MISC: 1.0000 | 0 refills | Status: DC | PRN

## 2019-09-09 MED ORDER — ALBUTEROL SULFATE HFA 108 (90 BASE) MCG/ACT IN AERS: 2.0000 | INHALATION_SPRAY | Freq: Four times a day (QID) | RESPIRATORY_TRACT | 0 refills | Status: DC | PRN

## 2019-09-09 MED ORDER — LIDOCAINE 5 % EX PTCH: 1.0000 | MEDICATED_PATCH | Freq: Two times a day (BID) | CUTANEOUS | 0 refills | Status: DC | PRN

## 2019-09-09 MED ORDER — BUPROPION HCL ER (SR) 100 MG PO TB12: 100.0000 mg | ORAL_TABLET | Freq: Two times a day (BID) | ORAL | 0 refills | Status: DC

## 2019-09-09 MED ORDER — TRELEGY ELLIPTA 100-62.5-25 MCG/INH IN AEPB: 1.0000 | INHALATION_SPRAY | Freq: Every day | RESPIRATORY_TRACT | 0 refills | Status: DC

## 2019-09-09 MED ORDER — CEPHALEXIN 500 MG PO CAPS
500.0000 mg | ORAL_CAPSULE | Freq: Once | ORAL | Status: AC
  Administered 2019-09-09: 500 mg via ORAL
  Filled 2019-09-09: qty 1

## 2019-09-09 MED ORDER — MORPHINE SULFATE (PF) 4 MG/ML IV SOLN
4.0000 mg | Freq: Once | INTRAVENOUS | Status: AC
  Administered 2019-09-09: 4 mg via INTRAVENOUS
  Filled 2019-09-09: qty 1

## 2019-09-09 MED ORDER — CYCLOBENZAPRINE HCL 10 MG PO TABS: 10.0000 mg | ORAL_TABLET | Freq: Three times a day (TID) | ORAL | 0 refills | Status: DC | PRN

## 2019-09-09 MED ORDER — POLYETHYLENE GLYCOL 3350 17 G PO PACK: 17.0000 g | PACK | Freq: Two times a day (BID) | ORAL | 0 refills | Status: DC | PRN

## 2019-09-09 MED ORDER — CLOPIDOGREL BISULFATE 75 MG PO TABS: 75.0000 mg | ORAL_TABLET | Freq: Every day | ORAL | 0 refills | Status: DC

## 2019-09-09 MED ORDER — ALBUTEROL SULFATE HFA 108 (90 BASE) MCG/ACT IN AERS
2.0000 | INHALATION_SPRAY | Freq: Once | RESPIRATORY_TRACT | Status: AC
  Administered 2019-09-09: 2 via RESPIRATORY_TRACT
  Filled 2019-09-09: qty 6.7

## 2019-09-09 MED ORDER — ATORVASTATIN CALCIUM 80 MG PO TABS: 80.0000 mg | ORAL_TABLET | Freq: Every day | ORAL | 0 refills | Status: DC

## 2019-09-09 MED ORDER — METOPROLOL TARTRATE 25 MG PO TABS: 25.0000 mg | ORAL_TABLET | Freq: Two times a day (BID) | ORAL | 0 refills | Status: DC

## 2019-09-09 MED ORDER — DICLOFENAC SODIUM 50 MG PO TBEC: 50.0000 mg | DELAYED_RELEASE_TABLET | Freq: Three times a day (TID) | ORAL | 0 refills | Status: DC | PRN

## 2019-09-09 MED ORDER — GABAPENTIN 300 MG PO CAPS: 300.0000 mg | ORAL_CAPSULE | Freq: Three times a day (TID) | ORAL | 0 refills | Status: DC

## 2019-09-09 MED FILL — UNIFINE PENTIPS 32GX5/32": 32G X 4 MM | 30 days supply | Qty: 200 | Fill #0

## 2019-09-09 MED FILL — GABAPENTIN 300 MG CAPSULE: 300 | 30 days supply | Qty: 90 | Fill #0

## 2019-09-09 MED FILL — ATORVASTATIN 80 MG TABLET: 80 | 30 days supply | Qty: 30 | Fill #0

## 2019-09-09 MED FILL — LIDOCAINE PATCH 5%: 5 | 15 days supply | Qty: 30 | Fill #0

## 2019-09-09 MED FILL — CLOPIDOGREL 75 MG TABLET: 75 | 30 days supply | Qty: 30 | Fill #0

## 2019-09-09 MED FILL — MIRTAZAPINE 45 MG TABLET: 45 | 30 days supply | Qty: 30 | Fill #0

## 2019-09-09 MED FILL — LANTUS 100 UNITS/ML VIAL: 100 | 28 days supply | Qty: 10 | Fill #0

## 2019-09-09 MED FILL — CYCLOBENZAPRINE HCL 10 MG T: 10 | 10 days supply | Qty: 30 | Fill #0

## 2019-09-09 MED FILL — NIFEdipine ER OSMOTIC RELEA: 30 | 30 days supply | Qty: 30 | Fill #0

## 2019-09-09 MED FILL — METOPROLOL TARTRATE 25 MG T: 25 | 15 days supply | Qty: 30 | Fill #0

## 2019-09-09 MED FILL — DICLOFENAC SOD EC 50 MG TAB: 50 | 10 days supply | Qty: 30 | Fill #0

## 2019-09-09 MED FILL — LOSARTAN POTASSIUM 100 MG T: 100 | 30 days supply | Qty: 30 | Fill #0

## 2019-09-09 MED FILL — UNIFINE PENTIPS 32GX5/32: 32G X 4 MM | 30 days supply | Qty: 200 | Fill #0

## 2019-09-09 MED FILL — buPROPion HCL ER (SR) 100 M: 100 | 30 days supply | Qty: 60 | Fill #0

## 2019-09-09 MED FILL — CEPHALEXIN 500 MG CAPSULE: 500 | 7 days supply | Qty: 21 | Fill #0

## 2019-09-09 MED FILL — OMEPRAZOLE 20 MG CAP: 20 | 30 days supply | Qty: 30 | Fill #0

## 2019-09-09 MED FILL — TRELEGY ELLIPTA 100-62.5-25: 100-62.5-25 | 30 days supply | Qty: 60 | Fill #0

## 2019-09-09 MED FILL — ALBUTEROL SULFATE HFA 108 (: 108 (90 BAS | 25 days supply | Qty: 18 | Fill #0

## 2019-09-09 MED FILL — HumaLOG 100 UNIT/ML SOLN: 100 | 28 days supply | Qty: 10 | Fill #0

## 2019-09-09 NOTE — ED Provider Notes (Signed)
[facility] DEPT Provider Note   CSN: [id number] Arrival date & time: 09/09/19  1017     History   Chief Complaint Chief Complaint  Patient presents with  . Hyperglycemia  . Foot Pain    bilateral  . Diarrhea    HPI MADDISYN HEGWOOD is a 48 y.o. female.      48 year old presents with hyperglycemia, bilateral foot pain and diarrhea.  Patient recently moved here from [place] with her sister, has run out of most of her medications and is waiting for her Medicaid to switch over.  Had recent stroke in August and has chronic left-sided deficits.  Patient was previously in the skilled nursing facility after her stroke but due to treatments family pulled her out of the facility.  They are working on trying to find placement for her currently but she is living with her sister for now.  She has not had her insulin or her gabapentin for her chronic neuropathy.  Also reports she has been having some intermittent diarrhea and due to severe pain in both feet she has had difficulty walking to get to the bathroom.  She reports some intermittent mild cramping abdominal pain associated with diarrhea, but no nausea or vomiting.  No fevers or chills.  She also reports some intermittent chest pain described as a tightness.  She thinks this is related to her COPD, states she is not having her inhalers.  Denies shortness of breath.  Reports she has a chronic cough related to COPD but this is unchanged from usual, nonproductive.  She denies any known sick contacts.  Patient sister is at bedside and states she has been trying to get her Medicaid switched over to [place] but has been having a lot of difficulty with social services.  They are hoping to get the patient placed in the skilled nursing facility for rehab.     Past Medical History:  Diagnosis Date  . Asthma   . COPD (chronic obstructive pulmonary disease) ([place])   . Diabetes mellitus without complication ([place])    . Hypertension   . Stroke [facility])     Patient Active Problem List   Diagnosis Date Noted  . HYPERTENSION 05/04/2009  . CHRONIC OBSTRUCTIVE PULMONARY DISEASE 05/04/2009    No past surgical history on file.   OB History   No obstetric history on file.      Home Medications    Prior to Admission medications   Medication Sig Start Date End Date Taking? Authorizing Provider  acetaminophen (TYLENOL) 325 MG tablet Take 325 mg by mouth every 6 (six) hours as needed for pain. 07/23/19 08/23/20 Yes [provider]  ALPRAZolam Duanne Moron) 0.5 MG tablet Take 0.5 mg by mouth every 8 (eight) hours as needed for anxiety.   Yes [provider]  amLODipine-benazepril (LOTREL) 10-40 MG capsule Take 1 capsule by mouth daily.   Yes [provider]  budesonide-formoterol (SYMBICORT) 160-4.5 MCG/ACT inhaler Take 2 puffs by mouth 2 (two) times daily. 10/12/18  Yes [provider]  docusate sodium (COLACE) 100 MG capsule Take 100 mg by mouth 2 (two) times daily as needed for constipation. 08/23/19  Yes [provider]  HYDROcodone-acetaminophen (NORCO) 7.5-325 MG tablet Take 1 tablet by mouth every 6 (six) hours as needed for pain. 08/23/19  Yes [provider]  ibuprofen (ADVIL) 800 MG tablet Take 800 mg by mouth every 8 (eight) hours as needed for pain. 09/25/18  Yes [provider]  ipratropium-albuterol (  DUONEB) 0.5-2.5 (3) MG/3ML SOLN Inhale 3 mLs into the lungs 4 (four) times daily as needed for wheezing. 01/18/19  Yes [provider]  methylphenidate (RITALIN) 5 MG tablet Take 5 mg by mouth daily.    Yes [provider]  naloxone (NARCAN) 4 MG/0.1ML LIQD nasal spray kit Place 1 spray into the nose once as needed. overdose 06/03/19  Yes [provider]  nicotine (NICODERM CQ - DOSED IN MG/24 HOURS) 21 mg/24hr patch Place 21 mg onto the skin as needed. Smoking sensation 03/12/18  Yes [provider]   tetrahydrozoline 0.05 % ophthalmic solution Place 1 drop into both eyes 4 (four) times daily as needed. Eye irritation 07/23/19  Yes [provider]  albuterol (VENTOLIN HFA) 108 (90 Base) MCG/ACT inhaler Inhale 2 puffs into the lungs every 6 (six) hours as needed for wheezing or shortness of breath. 09/09/19   Jacqlyn Larsen, PA-C  atorvastatin (LIPITOR) 80 MG tablet Take 1 tablet (80 mg total) by mouth daily. 09/09/19   Jacqlyn Larsen, PA-C  buPROPion (WELLBUTRIN SR) 100 MG 12 hr tablet Take 1 tablet (100 mg total) by mouth 2 (two) times daily. 09/09/19   Jacqlyn Larsen, PA-C  cephALEXin (KEFLEX) 500 MG capsule Take 1 capsule (500 mg total) by mouth 3 (three) times daily for 7 days. 09/09/19 09/16/19  Jacqlyn Larsen, PA-C  clopidogrel (PLAVIX) 75 MG tablet Take 1 tablet (75 mg total) by mouth daily. 09/09/19   Jacqlyn Larsen, PA-C  cyclobenzaprine (FLEXERIL) 10 MG tablet Take 1 tablet (10 mg total) by mouth 3 (three) times daily as needed for muscle spasms. 09/09/19   Jacqlyn Larsen, PA-C  diclofenac (VOLTAREN) 50 MG EC tablet Take 1 tablet (50 mg total) by mouth every 8 (eight) hours as needed for mild pain. 09/09/19   Jacqlyn Larsen, PA-C  Fluticasone-Umeclidin-Vilant (TRELEGY ELLIPTA) 100-62.5-25 MCG/INH AEPB Inhale 1 puff into the lungs daily. 09/09/19   Jacqlyn Larsen, PA-C  gabapentin (NEURONTIN) 300 MG capsule Take 1 capsule (300 mg total) by mouth 3 (three) times daily. 09/09/19 10/09/19  Jacqlyn Larsen, PA-C  insulin glargine (LANTUS) 100 UNIT/ML injection Inject 0.22 mLs (22 Units total) into the skin at bedtime. 09/09/19   Jacqlyn Larsen, PA-C  insulin lispro (HUMALOG) 100 UNIT/ML injection Inject 0.01-0.05 mLs (1-5 Units total) into the skin 3 (three) times daily as needed. PRN BLOOD SUGAR 151-200 1 unit 201-250 2 units 251-300 3 units 301-350 4 units 351-399 5 units Over 400 call MD 09/09/19   Jacqlyn Larsen, PA-C  Insulin Pen Needle (PEN NEEDLES) 32G X 4 MM MISC 1 each by Does not apply  route as needed. 09/09/19   Jacqlyn Larsen, PA-C  lidocaine (LIDODERM) 5 % Place 1 patch onto the skin every 12 (twelve) hours as needed. 09/09/19   Jacqlyn Larsen, PA-C  losartan (COZAAR) 100 MG tablet Take 1 tablet (100 mg total) by mouth daily. 09/09/19   Jacqlyn Larsen, PA-C  metoprolol tartrate (LOPRESSOR) 25 MG tablet Take 1 tablet (25 mg total) by mouth 2 (two) times daily. 09/09/19   Jacqlyn Larsen, PA-C  mirtazapine (REMERON) 45 MG tablet Take 1 tablet (45 mg total) by mouth at bedtime. 09/09/19   Jacqlyn Larsen, PA-C  NIFEdipine (PROCARDIA-XL/NIFEDICAL-XL) 30 MG 24 hr tablet Take 1 tablet (30 mg total) by mouth daily. 09/09/19   Jacqlyn Larsen, PA-C  omeprazole (PRILOSEC) 20 MG capsule Take 1 capsule (20 mg total)  by mouth daily. 09/09/19   Jacqlyn Larsen, PA-C  polyethylene glycol (MIRALAX / GLYCOLAX) 17 g packet Take 17 g by mouth 2 (two) times daily as needed. 09/09/19   Jacqlyn Larsen, PA-C    Family History No family history on file.  Social History Social History   Tobacco Use  . Smoking status: Current Every Day Smoker    Packs/day: 0.50    Types: Cigarettes  . Smokeless tobacco: Never Used  Substance Use Topics  . Alcohol use: No  . Drug use: No     Allergies   Guaifenesin, Levaquin [levofloxacin in d5w], Strawberry extract, and Kiwi extract   Review of Systems Review of Systems  Constitutional: Negative for chills and fever.  HENT: Negative.   Respiratory: Negative for cough and shortness of breath.   Cardiovascular: Positive for chest pain.  Gastrointestinal: Positive for abdominal pain and diarrhea. Negative for nausea and vomiting.  Genitourinary: Positive for frequency. Negative for dysuria and flank pain.  Musculoskeletal: Negative for arthralgias and myalgias.  Skin: Negative for color change and rash.  Neurological: Negative for dizziness, weakness, light-headedness and numbness.       Tingling in feet     Physical Exam Updated Vital Signs BP 134/87    Pulse (!) 111   Temp 98 F (36.7 C) (Oral)   Resp 18   SpO2 96%   Physical Exam Vitals signs and nursing note reviewed.  Constitutional:      General: She is not in acute distress.    Appearance: She is well-developed. She is not diaphoretic.     Comments: Chronically ill appearing, but in no acute distress  HENT:     Head: Normocephalic and atraumatic.     Mouth/Throat:     Mouth: Mucous membranes are moist.     Pharynx: Oropharynx is clear.  Eyes:     General:        Right eye: No discharge.        Left eye: No discharge.     Extraocular Movements: Extraocular movements intact.     Pupils: Pupils are equal, round, and reactive to light.  Neck:     Musculoskeletal: Neck supple.  Cardiovascular:     Rate and Rhythm: Normal rate and regular rhythm.     Pulses: Normal pulses.     Heart sounds: Normal heart sounds. No murmur. No friction rub. No gallop.   Pulmonary:     Effort: Pulmonary effort is normal. No respiratory distress.     Breath sounds: Normal breath sounds. No wheezing or rales.     Comments: Respirations equal and unlabored, patient able to speak in full sentences, lungs clear to auscultation bilaterally, breath sounds mildly diminished bilaterally Abdominal:     General: Bowel sounds are normal. There is no distension.     Palpations: Abdomen is soft. There is no mass.     Tenderness: There is no abdominal tenderness. There is no guarding.     Comments: Abdomen soft, nondistended, nontender to palpation in all quadrants without guarding or peritoneal signs  Musculoskeletal:        General: No deformity.     Comments: Bilateral feet are hypersensitive to light touch, no erythema, warm and well perfused, 2+ distal pulses, no wounds or skin breakdown  Skin:    General: Skin is warm and dry.     Capillary Refill: Capillary refill takes less than 2 seconds.  Neurological:     Mental Status: She is alert.  Coordination: Coordination normal.     Comments:  Speech is clear, able to follow commands CN III-XII intact Weakness in left upper and lower extremity unchanged from previous stroke, normal right-sided strength. Sensation normal to light and sharp touch, bilateral feet hypersensitive to touch Moves extremities without ataxia, coordination intact   Psychiatric:        Mood and Affect: Mood normal.        Behavior: Behavior normal.      ED Treatments / Results  Labs (all labs ordered are listed, but only abnormal results are displayed) Labs Reviewed  URINE CULTURE - Abnormal; Notable for the following components:      Result Value   Culture MULTIPLE SPECIES PRESENT, SUGGEST RECOLLECTION (*)    All other components within normal limits  COMPREHENSIVE METABOLIC PANEL - Abnormal; Notable for the following components:   Sodium 134 (*)    Glucose, Bld 253 (*)    Creatinine, Ser 1.10 (*)    AST 12 (*)    GFR calc non Af Amer 60 (*)    All other components within normal limits  URINALYSIS, ROUTINE W REFLEX MICROSCOPIC - Abnormal; Notable for the following components:   Glucose, UA 50 (*)    Hgb urine dipstick SMALL (*)    Leukocytes,Ua TRACE (*)    Bacteria, UA MANY (*)    All other components within normal limits  CBG MONITORING, ED - Abnormal; Notable for the following components:   Glucose-Capillary 248 (*)    All other components within normal limits  CBG MONITORING, ED - Abnormal; Notable for the following components:   Glucose-Capillary 161 (*)    All other components within normal limits  CBC WITH DIFFERENTIAL/PLATELET  LIPASE, BLOOD  TROPONIN I (HIGH SENSITIVITY)    EKG EKG Interpretation  Date/Time:  Thursday September 09 2019 10:31:07 EST Ventricular Rate:  112 PR Interval:    QRS Duration: 81 QT Interval:  368 QTC Calculation: 503 R Axis:   69 Text Interpretation: Sinus tachycardia Low voltage, precordial leads Anteroseptal infarct, old Baseline wander in lead(s) II III aVF Confirmed by Lacretia Leigh ([id number])  on 09/10/2019 2:20:12 PM   Radiology Dg Abd Acute W/chest  Result Date: 09/09/2019 CLINICAL DATA:  Diarrhea for 3 days EXAM: DG ABDOMEN ACUTE W/ 1V CHEST COMPARISON:  Chest radiograph 08/25/2019 FINDINGS: Normal heart size, mediastinal contours, and pulmonary vascularity. Lungs clear. No pulmonary infiltrate, pleural effusion or pneumothorax. Normal bowel gas pattern. No bowel dilatation, bowel wall thickening, or free air. Osseous structures unremarkable. No urinary tract calcification. IMPRESSION: Normal exam. Electronically Signed   By: Lavonia Dana M.D.   On: 09/09/2019 12:12    Procedures Procedures (including critical care time)  Medications Ordered in ED Medications  gabapentin (NEURONTIN) capsule 300 mg (has no administration in time range)  cephALEXin (KEFLEX) capsule 500 mg (has no administration in time range)  sodium chloride 0.9 % bolus 500 mL (0 mLs Intravenous Stopped 09/09/19 1457)  morphine 4 MG/ML injection 4 mg (4 mg Intravenous Given 09/09/19 1225)  ondansetron (ZOFRAN) injection 4 mg (4 mg Intravenous Given 09/09/19 1226)  famotidine (PEPCID) IVPB 20 mg premix (0 mg Intravenous Stopped 09/09/19 1258)  albuterol (VENTOLIN HFA) 108 (90 Base) MCG/ACT inhaler 2 puff (2 puffs Inhalation Given 09/09/19 1229)     Initial Impression / Assessment and Plan / ED Course  I have reviewed the triage vital signs and the nursing notes.  Pertinent labs & imaging results that were available during my care of the  patient were reviewed by me and considered in my medical decision making (see chart for details).  48 year old female who presents with hyperglycemia, bilateral foot pain and diarrhea. She has been out of her chronic medications and is seeking inpatient placement for rehab after recent stroke in August with chronic left-sided deficits.  Was previously in a skilled nursing facility in [place].  On arrival she is mildly tachycardic, I suspect this is primarily due to pain as patient  is complaining of severe pain in bilateral feet consistent with her neuropathy, feet are hypersensitive to the touch but are warm and well perfused with good pulses and no wounds or skin breakdown.  CBG of 240 on arrival, has not had her insulin in a few days.  Reports some intermittent chest pressure and tightness which patient thinks is related to her COPD, but she is certainly also at risk for ACS.  He does not worsen with exertion, and she is currently pain-free but will get EKG, troponin, and chest x-ray, as well as basic labs and urinalysis.  Will give patient 500 cc fluid bolus dose of pain medication and start her back on her gabapentin.  Consulted social work and case management for assistance with placement or resources for home health and to help patient get her home medications.  Labs show no leukocytosis, glucose of 253, but no other significant electrolyte derangements, creatinine of 1.10, troponin is negative.  Urinalysis with many bacteria and some leukocytes concerning for potential infection, will send for culture.  Chest and abdominal x-rays unremarkable.  After fluids glucose is improved to 161, pain has significantly improved and initial tach has resolved.  Social work has seen patient and spoken with the patient's sister, because she does not yet have a Medicaid she is not currently eligible for placement without paying out-of-pocket and patient sister cannot afford this.  Case management has provided patient with a match letter and all of her chronic medications will be prescribed to the [facility], aside from controlled substances, where she will be able to pick them up at $3 prescription.  This has been discussed with the patient and her sister.  They will continue to work on finding placement for the patient patient.  Case management has also set up a PCP appointment for the patient.  She has no medical need for admission based on her work-up today.  I discussed  this plan with the patient and her sister and they expressed agreement and understanding with plan.  Patient is feeling much better.  Discharged home in good condition  Final Clinical Impressions(s) / ED Diagnoses   Final diagnoses:  Hyperglycemia  Neuropathy  Diarrhea, unspecified type  Acute cystitis with hematuria    ED Discharge Orders         Ordered    albuterol (VENTOLIN HFA) 108 (90 Base) MCG/ACT inhaler  Every 6 hours PRN     09/09/19 1511    atorvastatin (LIPITOR) 80 MG tablet  Daily     09/09/19 1511    buPROPion (WELLBUTRIN SR) 100 MG 12 hr tablet  2 times daily     09/09/19 1511    clopidogrel (PLAVIX) 75 MG tablet  Daily     09/09/19 1511    cyclobenzaprine (FLEXERIL) 10 MG tablet  3 times daily PRN     09/09/19 1511    diclofenac (VOLTAREN) 50 MG EC tablet  Every 8 hours PRN     09/09/19 1511    Fluticasone-Umeclidin-Vilant (TRELEGY ELLIPTA)  100-62.5-25 MCG/INH AEPB  Daily     09/09/19 1511    gabapentin (NEURONTIN) 300 MG capsule  3 times daily     09/09/19 1511    insulin glargine (LANTUS) 100 UNIT/ML injection  Daily at bedtime     09/09/19 1511    insulin lispro (HUMALOG) 100 UNIT/ML injection  3 times daily PRN     09/09/19 1511    lidocaine (LIDODERM) 5 %  Every 12 hours PRN     09/09/19 1511    losartan (COZAAR) 100 MG tablet  Daily     09/09/19 1511    metoprolol tartrate (LOPRESSOR) 25 MG tablet  2 times daily     09/09/19 1511    mirtazapine (REMERON) 45 MG tablet  Daily at bedtime     09/09/19 1511    NIFEdipine (PROCARDIA-XL/NIFEDICAL-XL) 30 MG 24 hr tablet  Daily     09/09/19 1511    omeprazole (PRILOSEC) 20 MG capsule  Daily     09/09/19 1511    polyethylene glycol (MIRALAX / GLYCOLAX) 17 g packet  2 times daily PRN     09/09/19 1511    Insulin Pen Needle (PEN NEEDLES) 32G X 4 MM MISC  As needed     09/09/19 1525    cephALEXin (KEFLEX) 500 MG capsule  3 times daily     09/09/19 1542           Jacqlyn Larsen, [place] 09/11/19 Clarence, Ankit, MD 09/11/19 1653

## 2019-09-09 NOTE — ED Notes (Signed)
Pure wick has been placed. Suction set to 45mmHg. Pt has been educated on pure wick.  

## 2019-09-09 NOTE — Discharge Instructions (Addendum)
Your work-up today was reassuring, your urine does show signs of infection, antibiotics were prescribed please take these 3 times daily with food for the next 7 days.  All of your chronic medications has been prescribed to the [facility] please take match letter and pick up your prescriptions.  I was not able to prescribe your controlled substances due to [facility].  Our case manager is working on getting you an appointment scheduled at the [facility] for follow-up in primary care.  Continue working with social services to get your Medicaid switched over to [facility], this will make it easier for you to find placement for continued care.  Return to the ED for new or worsening symptoms.

## 2019-09-09 NOTE — Progress Notes (Signed)
  [place] Medication Assistance Card  Name: Karla Hoover (MRN): [id number] [place]: [id number] RX Group: [id number]  Discharge Date: 09/09/2019  Expiration Date: 09/17/2019  (must be filled within 7 days of discharge)     Dear Karla Hoover:  You have been approved to have the prescriptions written by your discharging physician filled through our [facility] (Medication Assistance Through [facility]) program. This program allows for a one-time (no refills) 34-day supply of selected medications for a low copay amount.  The copay is $3.00 per prescription. For instance, if you have one prescription, you will pay $3.00; for two prescriptions, you pay $6.00; for three prescriptions, you pay $9.00; and so on.  Only certain pharmacies are participating in this program with [facility]. You will need to select one of the pharmacies from the attached list and take your prescriptions, this letter, and your photo ID to one of the participating pharmacies.   We are excited that you are able to use the [facility] program to get your medications. These prescriptions must be filled within 7 days of hospital discharge or they will no longer be valid for the [facility] program. Should you have any problems with your prescriptions please contact your case management team member at [phone] or [phone] for [place] you,    [place]

## 2019-09-09 NOTE — ED Notes (Signed)
ED Provider at bedside. 

## 2019-09-09 NOTE — ED Notes (Signed)
Called lab and advised to add on urine culture.

## 2019-09-09 NOTE — ED Notes (Signed)
IV start attempted x3, will request [place] IV placement.

## 2019-09-09 NOTE — Progress Notes (Signed)
TOC CM spoke to pt and gave permission to speak to sister, Olin Hauser. Explained [place] program with $3 copay and once per year use. States she is waiting on patient [place] Medicaid so she can place her in an ALF. Appt at the [facility] for 10/06/2019 at 930 am. Jonnie Finner RN [place], [place] ED TOC CM [phone]

## 2019-09-09 NOTE — ED Triage Notes (Signed)
Pt BIBA from home,   Per EMS- Pt c/o bilateral foot pain x3 days and diarrhea for same time. Pt has hx of stroke in August, left sided deficits.  Pt reports she has not had hypertension or diabetic medications for at least one week.  Pt recently moved here. Pt able to stand with assistance.   VS- 142/88 116 HR 16 R 96% RA CBG 242  97.5 temporal

## 2019-09-09 NOTE — TOC Initial Note (Signed)
Transition of Care [facility]) - Initial/Assessment Note    Patient Details  Name: Karla Hoover MRN: [id number] Date of Birth: 05-Jul-1971  Transition of Care [facility]) CM/SW Contact:    Montine Circle, LCSW Phone Number: 09/09/2019, 1:53 PM  Clinical Narrative:                 CSW spoke with patient and patient's sister at bedside. Patient's sister reports she took patient out of a ALF in [place] where patient was not being adequately cared for. Patient has a stroke in August. Patient's sister has since brought patient to live with her here in [place] and is trying to get patient's Medicaid transferred here and get her into another ALF. Patient's sister reports she tried to get patient into [facility], but because patient's Medicaid change has not gone through they gave the bed to someone else. Patient's sister reports patient's PCP completed the FL2. Patient's sister also reports she needs to get patient's medication, but it is not covered at the pharmacy so she cannot afford to pay for it.   CSW explained that we can assist with getting patient her medication, but due to patient have out of state Medicaid placement cannot happen until that is done or if they can work out with an ALF to pay for the first money up front until the Medicaid comes through. Patient's sister reports she is unable to afford the pay for the first month. Patient also does not qualify for [facility] with out of state Medicaid. Patient's only option is to go home upon discharge until placement into an ALF can take place. PA provider made aware. RN CM Cathlean Cower to assist with [facility] letter for patient's medications.   Expected Discharge Plan: Home/Self Care Barriers to Discharge: No Barriers Identified   Patient Goals and CMS Choice Patient states their goals for this hospitalization and ongoing recovery are:: to find ALF placement and continue with rehab      Expected Discharge Plan and Services Expected Discharge Plan:  Home/Self Care In-house Referral: Clinical Social Work Discharge Planning Services: CM Consult   Living arrangements for the past 2 months: Single Family Home                                      Prior Living Arrangements/Services Living arrangements for the past 2 months: Single Family Home Lives with:: Siblings Patient language and need for interpreter reviewed:: Yes Do you feel safe going back to the place where you live?: Yes      Need for Family Participation in Patient Care: Yes (Comment) Care giver support system in place?: Yes (comment)   Criminal Activity/Legal Involvement Pertinent to Current Situation/Hospitalization: No - Comment as needed  Activities of Daily Living      Permission Sought/Granted Permission sought to share information with : Family Supports Permission granted to share information with : Yes, Verbal Permission Granted  Share Information with NAME: Margarita Rana     Permission granted to share info w Relationship: sister  Permission granted to share info w Contact Information: [phone]  Emotional Assessment Appearance:: Appears older than stated age Attitude/Demeanor/Rapport: Engaged Affect (typically observed): Accepting, Appropriate Orientation: : Oriented to Self, Oriented to Place, Oriented to  Time, Oriented to Situation      Admission diagnosis:  hyperglycemia leg pain diarrhea Patient Active Problem List   Diagnosis Date Noted  . HYPERTENSION 05/04/2009  .  CHRONIC OBSTRUCTIVE PULMONARY DISEASE 05/04/2009   PCP:  Patient, No Pcp Per Pharmacy:   [facility] [place] [address] Phone: [phone] Fax: [phone]  [facility], [address] [place]. [place] [address] Phone: [phone] Fax: [phone]     Social Determinants of Health (SDOH) Interventions     Readmission Risk Interventions No flowsheet data found.

## 2019-09-10 LAB — URINE CULTURE

## 2019-10-04 ENCOUNTER — Encounter (INDEPENDENT_AMBULATORY_CARE_PROVIDER_SITE_OTHER): Payer: Self-pay | Admitting: Primary Care

## 2019-10-04 ENCOUNTER — Other Ambulatory Visit: Payer: Self-pay

## 2019-10-04 ENCOUNTER — Ambulatory Visit (INDEPENDENT_AMBULATORY_CARE_PROVIDER_SITE_OTHER): Payer: Self-pay | Admitting: Primary Care

## 2019-10-04 ENCOUNTER — Other Ambulatory Visit: Payer: Self-pay | Admitting: Pharmacist

## 2019-10-04 VITALS — BP 148/91 | HR 82 | Temp 90.7°F | Ht 66.5 in | Wt 222.8 lb

## 2019-10-04 DIAGNOSIS — F17200 Nicotine dependence, unspecified, uncomplicated: Secondary | ICD-10-CM

## 2019-10-04 DIAGNOSIS — Z7689 Persons encountering health services in other specified circumstances: Secondary | ICD-10-CM

## 2019-10-04 DIAGNOSIS — Z09 Encounter for follow-up examination after completed treatment for conditions other than malignant neoplasm: Secondary | ICD-10-CM

## 2019-10-04 DIAGNOSIS — R946 Abnormal results of thyroid function studies: Secondary | ICD-10-CM

## 2019-10-04 DIAGNOSIS — I10 Essential (primary) hypertension: Secondary | ICD-10-CM

## 2019-10-04 DIAGNOSIS — E119 Type 2 diabetes mellitus without complications: Secondary | ICD-10-CM

## 2019-10-04 DIAGNOSIS — Z23 Encounter for immunization: Secondary | ICD-10-CM

## 2019-10-04 LAB — POCT GLYCOSYLATED HEMOGLOBIN (HGB A1C): Hemoglobin A1C: 7.4 % — AB (ref 4.0–5.6)

## 2019-10-04 MED ORDER — TRELEGY ELLIPTA 100-62.5-25 MCG/INH IN AEPB: 1.0000 | INHALATION_SPRAY | Freq: Every day | RESPIRATORY_TRACT | 5 refills | Status: DC

## 2019-10-04 MED ORDER — BASAGLAR KWIKPEN 100 UNIT/ML ~~LOC~~ SOPN: 15.0000 [IU] | PEN_INJECTOR | Freq: Every day | SUBCUTANEOUS | 3 refills | Status: DC

## 2019-10-04 MED ORDER — BUDESONIDE-FORMOTEROL FUMARATE 160-4.5 MCG/ACT IN AERO: 2.0000 | INHALATION_SPRAY | Freq: Two times a day (BID) | RESPIRATORY_TRACT | 5 refills | Status: DC

## 2019-10-04 MED ORDER — IPRATROPIUM-ALBUTEROL 0.5-2.5 (3) MG/3ML IN SOLN: 3.0000 mL | Freq: Four times a day (QID) | RESPIRATORY_TRACT | 3 refills | Status: DC | PRN

## 2019-10-04 MED ORDER — METOPROLOL TARTRATE 25 MG PO TABS: 25.0000 mg | ORAL_TABLET | Freq: Two times a day (BID) | ORAL | 5 refills | Status: DC

## 2019-10-04 MED ORDER — PEN NEEDLES 32G X 4 MM MISC: 1.0000 | 0 refills | Status: DC | PRN

## 2019-10-04 MED ORDER — INSULIN LISPRO 100 UNIT/ML ~~LOC~~ SOLN: 1.0000 [IU] | Freq: Three times a day (TID) | SUBCUTANEOUS | 0 refills | Status: DC | PRN

## 2019-10-04 MED ORDER — MIRTAZAPINE 45 MG PO TABS: 45.0000 mg | ORAL_TABLET | Freq: Every day | ORAL | 2 refills | Status: DC

## 2019-10-04 MED ORDER — IBUPROFEN 800 MG PO TABS: 800.0000 mg | ORAL_TABLET | Freq: Three times a day (TID) | ORAL | 2 refills | Status: DC | PRN

## 2019-10-04 MED ORDER — ATORVASTATIN CALCIUM 80 MG PO TABS: 80.0000 mg | ORAL_TABLET | Freq: Every day | ORAL | 5 refills | Status: AC

## 2019-10-04 MED ORDER — GABAPENTIN 300 MG PO CAPS: 300.0000 mg | ORAL_CAPSULE | Freq: Three times a day (TID) | ORAL | 5 refills | Status: DC

## 2019-10-04 MED ORDER — AMLODIPINE BESY-BENAZEPRIL HCL 10-40 MG PO CAPS: 1.0000 | ORAL_CAPSULE | Freq: Every day | ORAL | 5 refills | Status: DC

## 2019-10-04 MED ORDER — BUPROPION HCL ER (SR) 100 MG PO TB12: 100.0000 mg | ORAL_TABLET | Freq: Two times a day (BID) | ORAL | 5 refills | Status: DC

## 2019-10-04 MED ORDER — INSULIN LISPRO (1 UNIT DIAL) 100 UNIT/ML (KWIKPEN): PEN_INJECTOR | SUBCUTANEOUS | 2 refills | Status: DC

## 2019-10-04 MED ORDER — NIFEDIPINE ER OSMOTIC RELEASE 30 MG PO TB24: 30.0000 mg | ORAL_TABLET | Freq: Every day | ORAL | 5 refills | Status: DC

## 2019-10-04 MED ORDER — CYCLOBENZAPRINE HCL 10 MG PO TABS: 10.0000 mg | ORAL_TABLET | Freq: Three times a day (TID) | ORAL | 5 refills | Status: AC | PRN

## 2019-10-04 MED ORDER — ALBUTEROL SULFATE HFA 108 (90 BASE) MCG/ACT IN AERS: 2.0000 | INHALATION_SPRAY | Freq: Four times a day (QID) | RESPIRATORY_TRACT | 1 refills | Status: AC | PRN

## 2019-10-04 MED ORDER — CLOPIDOGREL BISULFATE 75 MG PO TABS: 75.0000 mg | ORAL_TABLET | Freq: Every day | ORAL | 5 refills | Status: DC

## 2019-10-04 MED FILL — MIRTAZAPINE 45 MG TABLET: 45 | 30 days supply | Qty: 30 | Fill #0

## 2019-10-04 MED FILL — IBUPROFEN 800 MG TABLET: 800 | 10 days supply | Qty: 30 | Fill #0

## 2019-10-04 MED FILL — !LANTUS SOLOSTAR 100UNITS/M: 100 | 20 days supply | Qty: 3 | Fill #0

## 2019-10-04 MED FILL — !HUMALOG 100 UNITS/ML KWIKP: 100 | 20 days supply | Qty: 3 | Fill #0

## 2019-10-04 MED FILL — AMLODIPINE-BENAZEPRIL 10-40: 10-40 | 30 days supply | Qty: 30 | Fill #0

## 2019-10-04 MED FILL — BUPROPION HCL SR 100 MG TAB: 100 | 30 days supply | Qty: 60 | Fill #0

## 2019-10-04 MED FILL — METOPROLOL TARTRATE 25 MG T: 25 | 30 days supply | Qty: 60 | Fill #0

## 2019-10-04 MED FILL — ATORVASTATIN 80 MG TABLET: 80 | 30 days supply | Qty: 30 | Fill #0

## 2019-10-04 MED FILL — CYCLOBENZAPRINE 10 MG TAB: 10 | 10 days supply | Qty: 30 | Fill #0

## 2019-10-04 MED FILL — CLOPIDOGREL 75 MG TABLET: 75 | 30 days supply | Qty: 30 | Fill #0

## 2019-10-04 MED FILL — TRUEPLUS PEN NDL 32GX5/32: 32G X 4 MM | 30 days supply | Qty: 100 | Fill #0

## 2019-10-04 MED FILL — ALBUTEROL SULFATE HFA 108 (: 108 (90 BAS | 25 days supply | Qty: 18 | Fill #0

## 2019-10-04 MED FILL — SYMBICORT 160-4.5 MCG INH: 160-4.5 | 30 days supply | Qty: 10 | Fill #0

## 2019-10-04 MED FILL — NIFEDIPINE ER OSMOTIC RELEA: 30 | 30 days supply | Qty: 30 | Fill #0

## 2019-10-04 MED FILL — IPRAT-ALBUT 0.5-3(2.5) MG/3: 0.5-2.5 (3) | 30 days supply | Qty: 360 | Fill #0

## 2019-10-04 NOTE — Patient Instructions (Signed)
Ischemic Stroke ° °An ischemic stroke is the sudden death of brain tissue. Blood carries oxygen to all areas of the body. This type of stroke happens when your blood does not flow to your brain like normal. Your brain cannot get the oxygen it needs. This is an emergency. It must be treated right away. °Symptoms of a stroke usually happen all of a sudden. You may notice them when you wake up. They can include: °· Weakness or loss of feeling in your face, arm, or leg. This often happens on one side of the body. °· Trouble walking. °· Trouble moving your arms or legs. °· Loss of balance or coordination. °· Feeling confused. °· Trouble talking or understanding what people are saying. °· Slurred speech. °· Trouble seeing. °· Seeing two of one object (double vision). °· Feeling dizzy. °· Feeling sick to your stomach (nauseous) and throwing up (vomiting). °· A very bad headache for no reason. °Get help as soon as any of these problems start. This is important. Some treatments work better if they are given right away. These include: °· Aspirin. °· Medicines to control blood pressure. °· A shot (injection) of medicine to break up the blood clot. °· Treatments given in the blood vessel (artery) to take out the clot or break it up. °Other treatments may include: °· Oxygen. °· Fluids given through an IV tube. °· Medicines to thin out your blood. °· Procedures to help your blood flow better. °What increases the risk? °Certain things may make you more likely to have a stroke. Some of these are things that you can change, such as: °· Being very overweight (obesity). °· Smoking. °· Taking birth control pills. °· Not being active. °· Drinking too much alcohol. °· Using drugs. °Other risk factors include: °· High blood pressure. °· High cholesterol. °· Diabetes. °· Heart disease. °· Being African American, Native American, Hispanic, or [place] Native. °· Being over age 60. °· Family history of stroke. °· Having had blood clots,  stroke, or warning stroke (transient ischemic attack, TIA) in the past. °· Sickle cell disease. °· Being a woman with a history of high blood pressure in pregnancy (preeclampsia). °· Migraine headache. °· Sleep apnea. °· Having an irregular heartbeat (atrial fibrillation). °· Long-term (chronic) diseases that cause soreness and swelling (inflammation). °· Disorders that affect how your blood clots. °Follow these instructions at home: °Medicines °· Take over-the-counter and prescription medicines only as told by your doctor. °· If you were told to take aspirin or another medicine to thin your blood, take it exactly as told by your doctor. °? Taking too much of the medicine can cause bleeding. °? If you do not take enough, it may not work as well. °· Know the side effects of your medicines. If you are taking a blood thinner, make sure you: °? Hold pressure over any cuts for longer than usual. °? Tell your dentist and other doctors that you take this medicine. °? Avoid activities that may cause damage or injury to your body. °Eating and drinking °· Follow instructions from your doctor about what you cannot eat or drink. °· Eat healthy foods. °· If you have trouble with swallowing, do these things to avoid choking: °? Take small bites when eating. °? Eat foods that are soft or pureed. °Safety °· Follow instructions from your health care team about physical activity. °· Use a walker or cane as told by your doctor. °· Keep your home safe so you do not fall.   This may include: °? Having experts look at your home to make sure it is safe. °? Putting grab bars in the bedroom and bathroom. °? Using raised toilets. °? Putting a seat in the shower. °General instructions °· Do not use any tobacco products. °? Examples of these are cigarettes, chewing tobacco, and e-cigarettes. °? If you need help quitting, ask your doctor. °· Limit how much alcohol you drink. This means no more than 1 drink a day for nonpregnant women and 2 drinks  a day for men. One drink equals 12 oz of beer, 5 oz of wine, or 1½ oz of hard liquor. °· If you need help to stop using drugs or alcohol, ask your doctor to refer you to a program or specialist. °· Stay active. Exercise as told by your doctor. °· Keep all follow-up visits as told by your doctor. This is important. °Get help right away if: ° °· You have any signs of a stroke. "BE FAST" is an easy way to remember the main warning signs: °? B - Balance. Signs are dizziness, sudden trouble walking, or loss of balance. °? E - Eyes. Signs are trouble seeing or a change in how you see. °? F - Face. Signs are sudden weakness or loss of feeling of the face, or the face or eyelid drooping on one side. °? A - Arms. Signs are weakness or loss of feeling in an arm. This happens suddenly and usually on one side of the body. °? S - Speech. Signs are sudden trouble speaking, slurred speech, or trouble understanding what people say. °? T - Time. Time to call emergency services. Write down what time symptoms started. °· You have other signs of a stroke, such as: °? A sudden, very bad headache with no known cause. °? Feeling sick to your stomach (nausea). °? Throwing up (vomiting). °? Jerky movements you cannot control (seizure). °These symptoms may be an emergency. Do not wait to see if the symptoms will go away. Get medical help right away. Call your local emergency services (911 in the [place] Do not drive yourself to the hospital. °Summary °· An ischemic stroke is the sudden death of brain tissue. °· Symptoms of a stroke usually happen all of a sudden. You may notice them when you wake up. °· Get help if you have any warning signs of a stroke. This is important. Some treatments work better if they are given right away. °This information is not intended to replace advice given to you by your health care provider. Make sure you discuss any questions you have with your health care provider. °Document Released: 09/12/2011 Document  Revised: 03/04/2018 Document Reviewed: 12/20/2015 °Elsevier Patient Education © 2020 [employer] ° ° ° °

## 2019-10-04 NOTE — Progress Notes (Signed)
Established Patient Office Visit  Subjective:  Patient ID: Karla Hoover, female    DOB: 18-Jun-1971  Age: 48 y.o. MRN: [id number]  CC:  Chief Complaint  Patient presents with  . Hospitalization Follow-up    DM/HTN   . Medication Refill    insulin    HPI [name] presents for management of diabetes she does has polydipsia, polyuria and polyphagia uncontrolled diabetes and recently seen in the emergency room for hyperglycemia.   Past Medical History:  Diagnosis Date  . Asthma   . COPD (chronic obstructive pulmonary disease) ([place])   . Diabetes mellitus without complication ([place])   . Hypertension   . Stroke [facility])     History reviewed. No pertinent surgical history.  History reviewed. No pertinent family history.  Social History   Socioeconomic History  . Marital status: Single    Spouse name: Not on file  . Number of children: Not on file  . Years of education: Not on file  . Highest education level: Not on file  Occupational History  . Not on file  Tobacco Use  . Smoking status: Current Every Day Smoker    Packs/day: 0.50    Types: Cigarettes  . Smokeless tobacco: Never Used  Substance and Sexual Activity  . Alcohol use: No  . Drug use: No  . Sexual activity: Not on file  Other Topics Concern  . Not on file  Social History Narrative  . Not on file   Social Determinants of Health   Financial Resource Strain:   . Difficulty of Paying Living Expenses: Not on file  Food Insecurity:   . Worried About Charity fundraiser in the Last Year: Not on file  . Ran Out of Food in the Last Year: Not on file  Transportation Needs:   . Lack of Transportation (Medical): Not on file  . Lack of Transportation (Non-Medical): Not on file  Physical Activity:   . Days of Exercise per Week: Not on file  . Minutes of Exercise per Session: Not on file  Stress:   . Feeling of Stress : Not on file  Social Connections:   . Frequency of Communication with Friends  and Family: Not on file  . Frequency of Social Gatherings with Friends and Family: Not on file  . Attends Religious Services: Not on file  . Active Member of Clubs or Organizations: Not on file  . Attends Archivist Meetings: Not on file  . Marital Status: Not on file  Intimate Partner Violence:   . Fear of Current or Ex-Partner: Not on file  . Emotionally Abused: Not on file  . Physically Abused: Not on file  . Sexually Abused: Not on file    Outpatient Medications Prior to Visit  Medication Sig Dispense Refill  . acetaminophen (TYLENOL) 325 MG tablet Take 325 mg by mouth every 6 (six) hours as needed for pain.    Marland Kitchen ALPRAZolam (XANAX) 0.5 MG tablet Take 0.5 mg by mouth every 8 (eight) hours as needed for anxiety.    . diclofenac (VOLTAREN) 50 MG EC tablet Take 1 tablet (50 mg total) by mouth every 8 (eight) hours as needed for mild pain. 30 tablet 0  . docusate sodium (COLACE) 100 MG capsule Take 100 mg by mouth 2 (two) times daily as needed for constipation.    Marland Kitchen HYDROcodone-acetaminophen (NORCO) 7.5-325 MG tablet Take 1 tablet by mouth every 6 (six) hours as needed for pain.    Marland Kitchen  lidocaine (LIDODERM) 5 % Place 1 patch onto the skin every 12 (twelve) hours as needed. 30 patch 0  . methylphenidate (RITALIN) 5 MG tablet Take 5 mg by mouth daily.     . naloxone (NARCAN) 4 MG/0.1ML LIQD nasal spray kit Place 1 spray into the nose once as needed. overdose    . nicotine (NICODERM CQ - DOSED IN MG/24 HOURS) 21 mg/24hr patch Place 21 mg onto the skin as needed. Smoking sensation    . polyethylene glycol (MIRALAX / GLYCOLAX) 17 g packet Take 17 g by mouth 2 (two) times daily as needed. 30 each 0  . tetrahydrozoline 0.05 % ophthalmic solution Place 1 drop into both eyes 4 (four) times daily as needed. Eye irritation    . albuterol (VENTOLIN HFA) 108 (90 Base) MCG/ACT inhaler Inhale 2 puffs into the lungs every 6 (six) hours as needed for wheezing or shortness of breath. 8 g 0  .  amLODipine-benazepril (LOTREL) 10-40 MG capsule Take 1 capsule by mouth daily.    Marland Kitchen atorvastatin (LIPITOR) 80 MG tablet Take 1 tablet (80 mg total) by mouth daily. 30 tablet 0  . budesonide-formoterol (SYMBICORT) 160-4.5 MCG/ACT inhaler Take 2 puffs by mouth 2 (two) times daily.    Marland Kitchen buPROPion (WELLBUTRIN SR) 100 MG 12 hr tablet Take 1 tablet (100 mg total) by mouth 2 (two) times daily. 60 tablet 0  . clopidogrel (PLAVIX) 75 MG tablet Take 1 tablet (75 mg total) by mouth daily. 30 tablet 0  . cyclobenzaprine (FLEXERIL) 10 MG tablet Take 1 tablet (10 mg total) by mouth 3 (three) times daily as needed for muscle spasms. 30 tablet 0  . Fluticasone-Umeclidin-Vilant (TRELEGY ELLIPTA) 100-62.5-25 MCG/INH AEPB Inhale 1 puff into the lungs daily. 28 each 0  . gabapentin (NEURONTIN) 300 MG capsule Take 1 capsule (300 mg total) by mouth 3 (three) times daily. 90 capsule 0  . ibuprofen (ADVIL) 800 MG tablet Take 800 mg by mouth every 8 (eight) hours as needed for pain.    Marland Kitchen insulin glargine (LANTUS) 100 UNIT/ML injection Inject 0.22 mLs (22 Units total) into the skin at bedtime. 10 mL 0  . insulin lispro (HUMALOG) 100 UNIT/ML injection Inject 0.01-0.05 mLs (1-5 Units total) into the skin 3 (three) times daily as needed. PRN BLOOD SUGAR 151-200 1 unit 201-250 2 units 251-300 3 units 301-350 4 units 351-399 5 units Over 400 call MD 10 mL 0  . Insulin Pen Needle (PEN NEEDLES) 32G X 4 MM MISC 1 each by Does not apply route as needed. 200 each 0  . ipratropium-albuterol (DUONEB) 0.5-2.5 (3) MG/3ML SOLN Inhale 3 mLs into the lungs 4 (four) times daily as needed for wheezing.    Marland Kitchen losartan (COZAAR) 100 MG tablet Take 1 tablet (100 mg total) by mouth daily. 30 tablet 0  . metoprolol tartrate (LOPRESSOR) 25 MG tablet Take 1 tablet (25 mg total) by mouth 2 (two) times daily. 30 tablet 0  . mirtazapine (REMERON) 45 MG tablet Take 1 tablet (45 mg total) by mouth at bedtime. 30 tablet 0  . NIFEdipine  (PROCARDIA-XL/NIFEDICAL-XL) 30 MG 24 hr tablet Take 1 tablet (30 mg total) by mouth daily. 30 tablet 0  . omeprazole (PRILOSEC) 20 MG capsule Take 1 capsule (20 mg total) by mouth daily. 30 capsule 0   No facility-administered medications prior to visit.    Allergies  Allergen Reactions  . Guaifenesin Anaphylaxis  . Levaquin [Levofloxacin In D5w] Shortness Of Breath and Rash  . Strawberry  Extract Anaphylaxis  . Kiwi Extract Other (See Comments)    Other reaction(s): Other (See Comments)     ROS Review of Systems  Respiratory: Positive for shortness of breath.   Endocrine: Positive for polydipsia.  Genitourinary:       Loss of bowel and bladder  Musculoskeletal: Positive for gait problem.  Neurological: Positive for headaches.  Psychiatric/Behavioral: Positive for agitation.  All other systems reviewed and are negative.     Objective:    Physical Exam  Constitutional: She is oriented to person, place, and time. She appears well-developed and well-nourished.  HENT:  Head: Normocephalic.  Cardiovascular: Normal rate and regular rhythm.  Pulmonary/Chest: Effort normal and breath sounds normal.  Abdominal: Bowel sounds are normal. She exhibits distension.  Musculoskeletal:     Cervical back: Neck supple.     Comments: Lower extremity weakness with unstable gait  Neurological: She is oriented to person, place, and time.  Left side weakness   Skin: Skin is warm.  Psychiatric: She has a normal mood and affect. Her behavior is normal. Thought content normal.    BP (!) 148/91 (BP Location: Right Arm, Patient Position: Sitting, Cuff Size: Normal)   Pulse 82   Temp (!) 90.7 F (32.6 C) (Temporal)   Ht 5' 6.5" (1.689 m)   Wt 222 lb 12.8 oz (101.1 kg) Comment: in wheelchair  LMP 09/21/2019 (Approximate)   SpO2 95%   BMI 35.42 kg/m  Wt Readings from Last 3 Encounters:  10/04/19 222 lb 12.8 oz (101.1 kg)     Health Maintenance Due  Topic Date Due  . HIV Screening   03/06/1987  . PAP SMEAR-Modifier  03/05/1993    There are no preventive care reminders to display for this patient.  Lab Results  Component Value Date   TSH 0.184 Test methodology is 3rd generation TSH (L) 04/21/2009   Lab Results  Component Value Date   WBC 10.5 09/09/2019   HGB 14.2 09/09/2019   HCT 43.2 09/09/2019   MCV 85.2 09/09/2019   PLT 234 09/09/2019   Lab Results  Component Value Date   NA 134 (L) 09/09/2019   K 4.3 09/09/2019   CO2 28 09/09/2019   GLUCOSE 253 (H) 09/09/2019   BUN 20 09/09/2019   CREATININE 1.10 (H) 09/09/2019   BILITOT 0.8 09/09/2019   ALKPHOS 73 09/09/2019   AST 12 (L) 09/09/2019   ALT 11 09/09/2019   PROT 6.9 09/09/2019   ALBUMIN 3.6 09/09/2019   CALCIUM 9.2 09/09/2019   ANIONGAP 8 09/09/2019   Lab Results  Component Value Date   CHOL 226 (H) 11/30/2012   Lab Results  Component Value Date   HDL 33 (L) 11/30/2012   Lab Results  Component Value Date   LDLCALC SEE COMMENT 11/30/2012   Lab Results  Component Value Date   TRIG 518 (H) 11/30/2012   Lab Results  Component Value Date   CHOLHDL 5.6 04/22/2009   Lab Results  Component Value Date   HGBA1C 7.4 (A) 10/04/2019      Assessment & Plan:   Ahmari was seen today for hospitalization follow-up and medication refill.  Diagnoses and all orders for this visit:  Type 2 diabetes mellitus without complication, without long-term current use of insulin (HCC) -     HgB A1c -     Cancel: Glucose (CBG)  Need for Tdap vaccination -     Tdap vaccine greater than or equal to 7yo IM  Encounter to establish care Juluis Mire,  NP-C will be your  (PCP) mastered prepared that is able to that will  diagnosed and treatment able to answer health concern as well as continuing care of varied medical conditions, not limited by cause, organ system, or diagnosis.   Tobacco use disorder She is aware of increased risk for lung cancer and other respiratory diseases recommend cessation.   This will be reminded at each clinical visit.  Essential hypertension Counseled on blood pressure goal of less than 130/80, low-sodium, DASH diet, medication compliance, 150 minutes of moderate intensity exercise per week. Discussed medication compliance, adverse effects.  Nonspecific abnormal results of function study of thyroid -     TSH + free T4  Other orders -     amLODipine-benazepril (LOTREL) 10-40 MG capsule; Take 1 capsule by mouth daily. -     Fluticasone-Umeclidin-Vilant (TRELEGY ELLIPTA) 100-62.5-25 MCG/INH AEPB; Inhale 1 puff into the lungs daily. -     Insulin Pen Needle (PEN NEEDLES) 32G X 4 MM MISC; 1 each by Does not apply route as needed. -     cyclobenzaprine (FLEXERIL) 10 MG tablet; Take 1 tablet (10 mg total) by mouth 3 (three) times daily as needed for muscle spasms. -     buPROPion (WELLBUTRIN SR) 100 MG 12 hr tablet; Take 1 tablet (100 mg total) by mouth 2 (two) times daily. -     Discontinue: insulin lispro (HUMALOG) 100 UNIT/ML injection; Inject 0.01-0.05 mLs (1-5 Units total) into the skin 3 (three) times daily as needed. PRN BLOOD SUGAR 151-200 1 unit 201-250 2 units 251-300 3 units 301-350 4 units 351-399 5 units Over 400 call MD -     budesonide-formoterol (SYMBICORT) 160-4.5 MCG/ACT inhaler; Inhale 2 puffs into the lungs 2 (two) times daily. -     albuterol (VENTOLIN HFA) 108 (90 Base) MCG/ACT inhaler; Inhale 2 puffs into the lungs every 6 (six) hours as needed for wheezing or shortness of breath. -     Insulin Glargine (BASAGLAR KWIKPEN) 100 UNIT/ML SOPN; Inject 0.15 mLs (15 Units total) into the skin daily. -     Discontinue: metoprolol tartrate (LOPRESSOR) 25 MG tablet; Take 1 tablet (25 mg total) by mouth 2 (two) times daily. -     ipratropium-albuterol (DUONEB) 0.5-2.5 (3) MG/3ML SOLN; Inhale 3 mLs into the lungs 4 (four) times daily as needed. -     NIFEdipine (PROCARDIA-XL/NIFEDICAL-XL) 30 MG 24 hr tablet; Take 1 tablet (30 mg total) by mouth daily. -      gabapentin (NEURONTIN) 300 MG capsule; Take 1 capsule (300 mg total) by mouth 3 (three) times daily. -     mirtazapine (REMERON) 45 MG tablet; Take 1 tablet (45 mg total) by mouth at bedtime. -     clopidogrel (PLAVIX) 75 MG tablet; Take 1 tablet (75 mg total) by mouth daily. -     atorvastatin (LIPITOR) 80 MG tablet; Take 1 tablet (80 mg total) by mouth daily. -     ibuprofen (ADVIL) 800 MG tablet; Take 1 tablet (800 mg total) by mouth every 8 (eight) hours as needed. -     metoprolol tartrate (LOPRESSOR) 25 MG tablet; Take 1 tablet (25 mg total) by mouth 2 (two) times daily.    Meds ordered this encounter  Medications  . amLODipine-benazepril (LOTREL) 10-40 MG capsule    Sig: Take 1 capsule by mouth daily.    Dispense:  30 capsule    Refill:  5  . Fluticasone-Umeclidin-Vilant (TRELEGY ELLIPTA) 100-62.5-25 MCG/INH AEPB    Sig:  Inhale 1 puff into the lungs daily.    Dispense:  28 each    Refill:  5  . Insulin Pen Needle (PEN NEEDLES) 32G X 4 MM MISC    Sig: 1 each by Does not apply route as needed.    Dispense:  200 each    Refill:  0  . cyclobenzaprine (FLEXERIL) 10 MG tablet    Sig: Take 1 tablet (10 mg total) by mouth 3 (three) times daily as needed for muscle spasms.    Dispense:  30 tablet    Refill:  5  . buPROPion (WELLBUTRIN SR) 100 MG 12 hr tablet    Sig: Take 1 tablet (100 mg total) by mouth 2 (two) times daily.    Dispense:  60 tablet    Refill:  5  . DISCONTD: insulin lispro (HUMALOG) 100 UNIT/ML injection    Sig: Inject 0.01-0.05 mLs (1-5 Units total) into the skin 3 (three) times daily as needed. PRN BLOOD SUGAR 151-200 1 unit 201-250 2 units 251-300 3 units 301-350 4 units 351-399 5 units Over 400 call MD    Dispense:  10 mL    Refill:  0  . budesonide-formoterol (SYMBICORT) 160-4.5 MCG/ACT inhaler    Sig: Inhale 2 puffs into the lungs 2 (two) times daily.    Dispense:  1 Inhaler    Refill:  5  . albuterol (VENTOLIN HFA) 108 (90 Base) MCG/ACT inhaler    Sig:  Inhale 2 puffs into the lungs every 6 (six) hours as needed for wheezing or shortness of breath.    Dispense:  8 g    Refill:  1  . Insulin Glargine (BASAGLAR KWIKPEN) 100 UNIT/ML SOPN    Sig: Inject 0.15 mLs (15 Units total) into the skin daily.    Dispense:  6 mL    Refill:  3  . DISCONTD: metoprolol tartrate (LOPRESSOR) 25 MG tablet    Sig: Take 1 tablet (25 mg total) by mouth 2 (two) times daily.    Dispense:  30 tablet    Refill:  5  . ipratropium-albuterol (DUONEB) 0.5-2.5 (3) MG/3ML SOLN    Sig: Inhale 3 mLs into the lungs 4 (four) times daily as needed.    Dispense:  360 mL    Refill:  3  . NIFEdipine (PROCARDIA-XL/NIFEDICAL-XL) 30 MG 24 hr tablet    Sig: Take 1 tablet (30 mg total) by mouth daily.    Dispense:  30 tablet    Refill:  5  . gabapentin (NEURONTIN) 300 MG capsule    Sig: Take 1 capsule (300 mg total) by mouth 3 (three) times daily.    Dispense:  90 capsule    Refill:  5  . mirtazapine (REMERON) 45 MG tablet    Sig: Take 1 tablet (45 mg total) by mouth at bedtime.    Dispense:  30 tablet    Refill:  2  . clopidogrel (PLAVIX) 75 MG tablet    Sig: Take 1 tablet (75 mg total) by mouth daily.    Dispense:  30 tablet    Refill:  5  . atorvastatin (LIPITOR) 80 MG tablet    Sig: Take 1 tablet (80 mg total) by mouth daily.    Dispense:  30 tablet    Refill:  5  . ibuprofen (ADVIL) 800 MG tablet    Sig: Take 1 tablet (800 mg total) by mouth every 8 (eight) hours as needed.    Dispense:  30 tablet    Refill:  2  . metoprolol tartrate (LOPRESSOR) 25 MG tablet    Sig: Take 1 tablet (25 mg total) by mouth 2 (two) times daily.    Dispense:  60 tablet    Refill:  5    Follow-up: Return in about 3 months (around 01/02/2020) for DM.    Kerin Perna, NP

## 2019-10-05 ENCOUNTER — Encounter (INDEPENDENT_AMBULATORY_CARE_PROVIDER_SITE_OTHER): Payer: Self-pay | Admitting: Primary Care

## 2019-10-05 LAB — TSH+FREE T4
Free T4: 1.17 ng/dL (ref 0.82–1.77)
TSH: 6.78 u[IU]/mL — ABNORMAL HIGH (ref 0.450–4.500)

## 2019-10-06 ENCOUNTER — Encounter (INDEPENDENT_AMBULATORY_CARE_PROVIDER_SITE_OTHER): Payer: Self-pay | Admitting: Primary Care

## 2019-10-06 ENCOUNTER — Telehealth (INDEPENDENT_AMBULATORY_CARE_PROVIDER_SITE_OTHER): Payer: Self-pay | Admitting: General Practice

## 2019-10-06 ENCOUNTER — Inpatient Hospital Stay (INDEPENDENT_AMBULATORY_CARE_PROVIDER_SITE_OTHER): Payer: Medicaid - Out of State | Admitting: Primary Care

## 2019-10-06 NOTE — Telephone Encounter (Signed)
Patients sister would like a call back about the TSH and T4 results from 10/04/2019. Please follow up when possible as she does not understand what they mean.

## 2019-10-07 ENCOUNTER — Other Ambulatory Visit (INDEPENDENT_AMBULATORY_CARE_PROVIDER_SITE_OTHER): Payer: Self-pay | Admitting: Primary Care

## 2019-10-07 MED ORDER — LEVOTHYROXINE SODIUM 50 MCG PO TABS: 50.0000 ug | ORAL_TABLET | Freq: Every day | ORAL | 1 refills | Status: DC

## 2019-10-07 NOTE — Telephone Encounter (Signed)
PCP has not reviewed results. Will call patient when results have been reviewed.

## 2019-10-16 ENCOUNTER — Encounter (INDEPENDENT_AMBULATORY_CARE_PROVIDER_SITE_OTHER): Payer: Self-pay | Admitting: Primary Care

## 2019-10-25 MED FILL — CYCLOBENZAPRINE 10 MG TAB: 10 | 10 days supply | Qty: 30 | Fill #0

## 2019-10-25 MED FILL — ALBUTEROL SULFATE HFA 108 (: 108 (90 BAS | 25 days supply | Qty: 18 | Fill #0

## 2019-10-25 MED FILL — ATORVASTATIN 80 MG TABLET: 80 | 30 days supply | Qty: 30 | Fill #0

## 2019-10-25 MED FILL — CLOPIDOGREL 75 MG TABLET: 75 | 30 days supply | Qty: 30 | Fill #0

## 2019-10-25 MED FILL — AMLODIPINE-BENAZEPRIL 10-40: 10-40 | 30 days supply | Qty: 30 | Fill #0

## 2019-10-25 MED FILL — METOPROLOL TARTRATE 25 MG T: 25 | 30 days supply | Qty: 60 | Fill #0

## 2019-10-25 MED FILL — NIFEDIPINE ER OSMOTIC RELEA: 30 | 30 days supply | Qty: 30 | Fill #0

## 2019-10-25 MED FILL — TRUEPLUS PEN NDL 32GX5/32: 32G X 4 MM | 30 days supply | Qty: 100 | Fill #0

## 2019-10-25 MED FILL — SYMBICORT 160-4.5 MCG INH: 160-4.5 | 30 days supply | Qty: 10 | Fill #0

## 2019-10-25 MED FILL — BUPROPION HCL SR 100 MG TAB: 100 | 30 days supply | Qty: 60 | Fill #0

## 2019-10-25 MED FILL — IBUPROFEN 800 MG TABLET: 800 | 10 days supply | Qty: 30 | Fill #0

## 2019-10-25 MED FILL — !LANTUS SOLOSTAR 100UNITS/M: 100 | 40 days supply | Qty: 6 | Fill #0

## 2019-10-25 MED FILL — IPRAT-ALBUT 0.5-3(2.5) MG/3: 0.5-2.5 (3) | 30 days supply | Qty: 360 | Fill #0

## 2019-10-25 MED FILL — HUMALOG 100 UNITS/ML KWIKPE: 100 | 20 days supply | Qty: 3 | Fill #0

## 2019-10-25 MED FILL — MIRTAZAPINE 45 MG TABLET: 45 | 30 days supply | Qty: 30 | Fill #0

## 2019-10-26 ENCOUNTER — Encounter (INDEPENDENT_AMBULATORY_CARE_PROVIDER_SITE_OTHER): Payer: Self-pay | Admitting: Primary Care

## 2019-11-02 ENCOUNTER — Encounter (INDEPENDENT_AMBULATORY_CARE_PROVIDER_SITE_OTHER): Payer: Self-pay | Admitting: Primary Care

## 2019-11-03 ENCOUNTER — Encounter (HOSPITAL_COMMUNITY): Payer: Self-pay

## 2019-11-03 ENCOUNTER — Other Ambulatory Visit: Payer: Self-pay

## 2019-11-03 ENCOUNTER — Emergency Department (HOSPITAL_COMMUNITY)
Admission: EM | Admit: 2019-11-03 | Discharge: 2019-11-03 | Disposition: A | Discharge status: Home / Self Care | Payer: Medicaid Other | Attending: Emergency Medicine | Admitting: Emergency Medicine

## 2019-11-03 ENCOUNTER — Emergency Department (HOSPITAL_COMMUNITY): Payer: Medicaid Other

## 2019-11-03 DIAGNOSIS — F17210 Nicotine dependence, cigarettes, uncomplicated: Secondary | ICD-10-CM | POA: Diagnosis not present

## 2019-11-03 DIAGNOSIS — M25572 Pain in left ankle and joints of left foot: Secondary | ICD-10-CM | POA: Insufficient documentation

## 2019-11-03 DIAGNOSIS — Z794 Long term (current) use of insulin: Secondary | ICD-10-CM | POA: Diagnosis not present

## 2019-11-03 DIAGNOSIS — E119 Type 2 diabetes mellitus without complications: Secondary | ICD-10-CM | POA: Diagnosis not present

## 2019-11-03 DIAGNOSIS — W050XXA Fall from non-moving wheelchair, initial encounter: Secondary | ICD-10-CM | POA: Insufficient documentation

## 2019-11-03 DIAGNOSIS — Y929 Unspecified place or not applicable: Secondary | ICD-10-CM | POA: Diagnosis not present

## 2019-11-03 DIAGNOSIS — M25552 Pain in left hip: Secondary | ICD-10-CM | POA: Diagnosis not present

## 2019-11-03 DIAGNOSIS — Y939 Activity, unspecified: Secondary | ICD-10-CM | POA: Diagnosis not present

## 2019-11-03 DIAGNOSIS — M25532 Pain in left wrist: Secondary | ICD-10-CM | POA: Insufficient documentation

## 2019-11-03 DIAGNOSIS — S29011A Strain of muscle and tendon of front wall of thorax, initial encounter: Secondary | ICD-10-CM

## 2019-11-03 DIAGNOSIS — Y999 Unspecified external cause status: Secondary | ICD-10-CM | POA: Insufficient documentation

## 2019-11-03 DIAGNOSIS — I10 Essential (primary) hypertension: Secondary | ICD-10-CM | POA: Diagnosis not present

## 2019-11-03 DIAGNOSIS — Z79899 Other long term (current) drug therapy: Secondary | ICD-10-CM | POA: Insufficient documentation

## 2019-11-03 DIAGNOSIS — R519 Headache, unspecified: Secondary | ICD-10-CM | POA: Insufficient documentation

## 2019-11-03 DIAGNOSIS — M25512 Pain in left shoulder: Secondary | ICD-10-CM | POA: Insufficient documentation

## 2019-11-03 DIAGNOSIS — M542 Cervicalgia: Secondary | ICD-10-CM | POA: Diagnosis not present

## 2019-11-03 DIAGNOSIS — J449 Chronic obstructive pulmonary disease, unspecified: Secondary | ICD-10-CM | POA: Insufficient documentation

## 2019-11-03 DIAGNOSIS — W19XXXA Unspecified fall, initial encounter: Secondary | ICD-10-CM

## 2019-11-03 DIAGNOSIS — S299XXA Unspecified injury of thorax, initial encounter: Secondary | ICD-10-CM | POA: Diagnosis present

## 2019-11-03 MED ORDER — OXYCODONE-ACETAMINOPHEN 5-325 MG PO TABS
1.0000 | ORAL_TABLET | Freq: Once | ORAL | Status: AC
  Administered 2019-11-03: 1 via ORAL
  Filled 2019-11-03: qty 1

## 2019-11-03 NOTE — ED Notes (Signed)
RN called patient's sister who said she would let us know when she is here to pick up her sister

## 2019-11-03 NOTE — ED Triage Notes (Signed)
Per EMS, fell transitioning from her wc to recliner-left sided deficits due to CVA 6 months ago-fell backwards hitting back of head on coffee table-complaining of back pain

## 2019-11-03 NOTE — ED Provider Notes (Signed)
[facility] DEPT Provider Note   CSN: [id number] Arrival date & time: 11/03/19  1449     History No chief complaint on file.   Karla Hoover is a 49 y.o. female.  Presents to the ER with concern for fall, left-sided pain.  Patient states this afternoon she was transferring from her wheelchair to recliner when she excellently fell and landed on her left side.  Thinks she hit her head, denies loss of consciousness.  Has pain on her left chest, left shoulder, left wrist, left hip, left ankle.  She states the worst pain is in her chest.  Worse with deep breath, moderate in severity.  Has not take any medication yet for this.  Reports she was having no symptoms prior to the fall.  Past medical history stroke, with left-sided deficits.  HPI     Past Medical History:  Diagnosis Date  . Asthma   . COPD (chronic obstructive pulmonary disease) ([place])   . Diabetes mellitus without complication ([place])   . Hypertension   . Stroke [facility])     Patient Active Problem List   Diagnosis Date Noted  . Essential hypertension 05/04/2009  . CHRONIC OBSTRUCTIVE PULMONARY DISEASE 05/04/2009    History reviewed. No pertinent surgical history.   OB History   No obstetric history on file.     Family History  Problem Relation Age of Onset  . Diabetes Mother   . COPD Sister   . Heart failure Sister     Social History   Tobacco Use  . Smoking status: Current Every Day Smoker    Packs/day: 0.50    Types: Cigarettes  . Smokeless tobacco: Never Used  Substance Use Topics  . Alcohol use: No  . Drug use: No    Home Medications Prior to Admission medications   Medication Sig Start Date End Date Taking? Authorizing Provider  albuterol (VENTOLIN HFA) 108 (90 Base) MCG/ACT inhaler Inhale 2 puffs into the lungs every 6 (six) hours as needed for wheezing or shortness of breath. 10/04/19  Yes Kerin Perna, NP  amLODipine-benazepril (LOTREL) 10-40 MG capsule  Take 1 capsule by mouth daily. 10/04/19  Yes Kerin Perna, NP  atorvastatin (LIPITOR) 80 MG tablet Take 1 tablet (80 mg total) by mouth daily. 10/04/19  Yes Kerin Perna, NP  buPROPion (WELLBUTRIN SR) 100 MG 12 hr tablet Take 1 tablet (100 mg total) by mouth 2 (two) times daily. 10/04/19  Yes Kerin Perna, NP  clopidogrel (PLAVIX) 75 MG tablet Take 1 tablet (75 mg total) by mouth daily. 10/04/19  Yes Kerin Perna, NP  cyclobenzaprine (FLEXERIL) 10 MG tablet Take 1 tablet (10 mg total) by mouth 3 (three) times daily as needed for muscle spasms. 10/04/19  Yes Kerin Perna, NP  ibuprofen (ADVIL) 800 MG tablet Take 1 tablet (800 mg total) by mouth every 8 (eight) hours as needed. 10/04/19  Yes Kerin Perna, NP  Insulin Glargine (BASAGLAR KWIKPEN) 100 UNIT/ML SOPN Inject 0.15 mLs (15 Units total) into the skin daily. 10/04/19  Yes Kerin Perna, NP  levothyroxine (SYNTHROID) 50 MCG tablet Take 1 tablet (50 mcg total) by mouth daily. 10/07/19  Yes Kerin Perna, NP  metoprolol tartrate (LOPRESSOR) 25 MG tablet Take 1 tablet (25 mg total) by mouth 2 (two) times daily. 10/04/19  Yes Kerin Perna, NP  NIFEdipine (PROCARDIA-XL/NIFEDICAL-XL) 30 MG 24 hr tablet Take 1 tablet (30 mg total) by mouth daily. 10/04/19  Yes  Kerin Perna, NP  acetaminophen (TYLENOL) 325 MG tablet Take 325 mg by mouth every 6 (six) hours as needed for pain. 07/23/19 08/23/20  [provider]  ALPRAZolam Duanne Moron) 0.5 MG tablet Take 0.5 mg by mouth every 8 (eight) hours as needed for anxiety.    [provider]  budesonide-formoterol (SYMBICORT) 160-4.5 MCG/ACT inhaler Inhale 2 puffs into the lungs 2 (two) times daily. 10/04/19   Kerin Perna, NP  diclofenac (VOLTAREN) 50 MG EC tablet Take 1 tablet (50 mg total) by mouth every 8 (eight) hours as needed for mild pain. 09/09/19   Jacqlyn Larsen, PA-C  docusate sodium (COLACE) 100 MG capsule Take 100 mg  by mouth 2 (two) times daily as needed for constipation. 08/23/19   [provider]  Fluticasone-Umeclidin-Vilant (TRELEGY ELLIPTA) 100-62.5-25 MCG/INH AEPB Inhale 1 puff into the lungs daily. 10/04/19   Kerin Perna, NP  gabapentin (NEURONTIN) 300 MG capsule Take 1 capsule (300 mg total) by mouth 3 (three) times daily. 10/04/19 11/03/19  Kerin Perna, NP  HYDROcodone-acetaminophen (NORCO) 7.5-325 MG tablet Take 1 tablet by mouth every 6 (six) hours as needed for pain. 08/23/19   [provider]  insulin lispro (HUMALOG KWIKPEN) 100 UNIT/ML KwikPen Inject 0.01-0.05 mLs (1-5 Units total) into the skin 3 (three) times daily as needed. PRN BLOOD SUGAR 151-200 1 unit 201-250 2 units 251-300 3 units 301-350 4 units 351-399 5 units Over 400 call MD 10/04/19   Charlott Rakes, MD  Insulin Pen Needle (PEN NEEDLES) 32G X 4 MM MISC 1 each by Does not apply route as needed. 10/04/19   Kerin Perna, NP  ipratropium-albuterol (DUONEB) 0.5-2.5 (3) MG/3ML SOLN Inhale 3 mLs into the lungs 4 (four) times daily as needed. 10/04/19   Kerin Perna, NP  lidocaine (LIDODERM) 5 % Place 1 patch onto the skin every 12 (twelve) hours as needed. 09/09/19   Jacqlyn Larsen, PA-C  methylphenidate (RITALIN) 5 MG tablet Take 5 mg by mouth daily.     [provider]  mirtazapine (REMERON) 45 MG tablet Take 1 tablet (45 mg total) by mouth at bedtime. 10/04/19   Kerin Perna, NP  naloxone [facility]) 4 MG/0.1ML LIQD nasal spray kit Place 1 spray into the nose once as needed. overdose 06/03/19   [provider]  nicotine (NICODERM CQ - DOSED IN MG/24 HOURS) 21 mg/24hr patch Place 21 mg onto the skin as needed. Smoking sensation 03/12/18   [provider]  polyethylene glycol (MIRALAX / GLYCOLAX) 17 g packet Take 17 g by mouth 2 (two) times daily as needed. 09/09/19   Jacqlyn Larsen, PA-C  tetrahydrozoline 0.05 % ophthalmic solution Place 1 drop into both eyes 4 (four)  times daily as needed. Eye irritation 07/23/19   [provider]    Allergies    Guaifenesin, Levaquin [levofloxacin in d5w], Strawberry extract, and Kiwi extract  Review of Systems   Review of Systems  Constitutional: Negative for chills and fever.  HENT: Negative for ear pain and sore throat.   Eyes: Negative for pain and visual disturbance.  Respiratory: Negative for cough and shortness of breath.   Cardiovascular: Negative for chest pain and palpitations.  Gastrointestinal: Negative for abdominal pain and vomiting.  Genitourinary: Negative for dysuria and hematuria.  Musculoskeletal: Positive for arthralgias, back pain and neck pain.  Skin: Negative for color change and rash.  Neurological: Negative for seizures and syncope.  All other systems reviewed and are negative.  Physical Exam Updated Vital Signs BP (!) 145/81 (BP Location: Right Arm)   Pulse 82   Temp 98.7 F (37.1 C) (Oral)   Resp 16   Ht 5' 6.5" (1.689 m)   Wt 84.8 kg   LMP 10/20/2019   SpO2 100%   BMI 29.73 kg/m   Physical Exam Vitals and nursing note reviewed.  Constitutional:      General: She is not in acute distress.    Appearance: She is well-developed.  HENT:     Head: Normocephalic and atraumatic.  Eyes:     Conjunctiva/sclera: Conjunctivae normal.  Cardiovascular:     Rate and Rhythm: Normal rate and regular rhythm.     Heart sounds: No murmur.  Pulmonary:     Effort: Pulmonary effort is normal. No respiratory distress.     Breath sounds: Normal breath sounds.  Abdominal:     Palpations: Abdomen is soft.     Tenderness: There is no abdominal tenderness.  Musculoskeletal:     Cervical back: Neck supple.     Comments: TTP over neck, T spine; no L spine TTP, no step offs or deformity; mild TTP over prox L humerus, wrist, L hip, L ankle; no deformity noted throughout, no echymosis; distal pulses intact throughout all four extremities  Skin:    General: Skin is warm and dry.    Neurological:     Mental Status: She is alert.     ED Results / Procedures / Treatments   Labs (all labs ordered are listed, but only abnormal results are displayed) Labs Reviewed - No data to display  EKG None  Radiology DG Ribs Unilateral W/Chest Left  Result Date: 11/03/2019 CLINICAL DATA:  Left-sided chest pain after fall. EXAM: LEFT RIBS AND CHEST - 3+ VIEW COMPARISON:  Chest x-ray dated Feb 27, 2015. FINDINGS: No fracture or other bone lesions are seen involving the ribs. There is no evidence of pneumothorax or pleural effusion. Both lungs are clear. Heart size and mediastinal contours are within normal limits. IMPRESSION: Negative. Electronically Signed   By: Titus Dubin M.D.   On: 11/03/2019 18:08   DG Thoracic Spine 2 View  Result Date: 11/03/2019 CLINICAL DATA:  Back pain after ground level fall. EXAM: THORACIC SPINE 2 VIEWS COMPARISON:  Chest x-ray dated Feb 28, 2015. FINDINGS: Twelve rib-bearing thoracic vertebral bodies. No acute fracture or subluxation. Vertebral body heights are preserved. Alignment is normal. Mild degenerative disc disease in the mid to lower thoracic spine. IMPRESSION: No acute osseous abnormality. Electronically Signed   By: Titus Dubin M.D.   On: 11/03/2019 18:16   DG Forearm Left  Result Date: 11/03/2019 CLINICAL DATA:  Left forearm pain after ground level fall. EXAM: LEFT FOREARM - 2 VIEW COMPARISON:  None. FINDINGS: There is no evidence of fracture or other focal bone lesions. Soft tissues are unremarkable. IMPRESSION: Negative. Electronically Signed   By: Titus Dubin M.D.   On: 11/03/2019 18:12   DG Knee 2 Views Left  Result Date: 11/03/2019 CLINICAL DATA:  Left knee pain after ground level fall. EXAM: LEFT KNEE - 1-2 VIEW COMPARISON:  None. FINDINGS: No evidence of fracture, dislocation, or joint effusion. No evidence of arthropathy or other focal bone abnormality. Soft tissues are unremarkable. IMPRESSION: Negative. Electronically  Signed   By: Titus Dubin M.D.   On: 11/03/2019 18:14   DG Ankle Complete Left  Result Date: 11/03/2019 CLINICAL DATA:  Left ankle pain after ground level fall. EXAM: LEFT ANKLE COMPLETE - 3+  VIEW COMPARISON:  None. FINDINGS: No acute fracture or dislocation. The ankle mortise is symmetric. The talar dome is intact. No tibiotalar joint effusion. Joint spaces are preserved. Bone mineralization is normal. Small Achilles enthesophyte. Mild soft tissue swelling medially. IMPRESSION: No acute osseous abnormality. Electronically Signed   By: Titus Dubin M.D.   On: 11/03/2019 18:14   CT Head Wo Contrast  Result Date: 11/03/2019 CLINICAL DATA:  Fall. Neck pain. History of prior stroke with left hemiparesis. EXAM: CT HEAD WITHOUT CONTRAST CT CERVICAL SPINE WITHOUT CONTRAST TECHNIQUE: Multidetector CT imaging of the head and cervical spine was performed following the standard protocol without intravenous contrast. Multiplanar CT image reconstructions of the cervical spine were also generated. COMPARISON:  CT neck dated June 21, 2012. CT head dated April 27, 2011. FINDINGS: CT HEAD FINDINGS Brain: No evidence of acute infarction, hemorrhage, hydrocephalus, extra-axial collection or mass lesion/mass effect. Chronic infarct in the right basal ganglia and external capsule. Vascular: Atherosclerotic vascular calcification of the carotid siphons. No hyperdense vessel. Skull: Normal. Negative for fracture or focal lesion. Sinuses/Orbits: No acute finding. Other: None. CT CERVICAL SPINE FINDINGS Alignment: No traumatic malalignment. Skull base and vertebrae: No acute fracture. No primary bone lesion or focal pathologic process. Soft tissues and spinal canal: No prevertebral fluid or swelling. No visible canal hematoma. Disc levels: Disc heights are relatively preserved. Early uncovertebral hypertrophy at C3-C4 and C4-C5. Upper chest: Mild centrilobular emphysema. Other: Mildly enlarged heterogeneous thyroid gland  without discrete nodule. IMPRESSION: 1. No acute intracranial abnormality. Chronic infarct in the right basal ganglia and external capsule. 2. No acute cervical spine fracture. Electronically Signed   By: Titus Dubin M.D.   On: 11/03/2019 18:06   CT Cervical Spine Wo Contrast  Result Date: 11/03/2019 CLINICAL DATA:  Fall. Neck pain. History of prior stroke with left hemiparesis. EXAM: CT HEAD WITHOUT CONTRAST CT CERVICAL SPINE WITHOUT CONTRAST TECHNIQUE: Multidetector CT imaging of the head and cervical spine was performed following the standard protocol without intravenous contrast. Multiplanar CT image reconstructions of the cervical spine were also generated. COMPARISON:  CT neck dated June 21, 2012. CT head dated April 27, 2011. FINDINGS: CT HEAD FINDINGS Brain: No evidence of acute infarction, hemorrhage, hydrocephalus, extra-axial collection or mass lesion/mass effect. Chronic infarct in the right basal ganglia and external capsule. Vascular: Atherosclerotic vascular calcification of the carotid siphons. No hyperdense vessel. Skull: Normal. Negative for fracture or focal lesion. Sinuses/Orbits: No acute finding. Other: None. CT CERVICAL SPINE FINDINGS Alignment: No traumatic malalignment. Skull base and vertebrae: No acute fracture. No primary bone lesion or focal pathologic process. Soft tissues and spinal canal: No prevertebral fluid or swelling. No visible canal hematoma. Disc levels: Disc heights are relatively preserved. Early uncovertebral hypertrophy at C3-C4 and C4-C5. Upper chest: Mild centrilobular emphysema. Other: Mildly enlarged heterogeneous thyroid gland without discrete nodule. IMPRESSION: 1. No acute intracranial abnormality. Chronic infarct in the right basal ganglia and external capsule. 2. No acute cervical spine fracture. Electronically Signed   By: Titus Dubin M.D.   On: 11/03/2019 18:06   DG Humerus Left  Result Date: 11/03/2019 CLINICAL DATA:  Left upper arm pain  after ground level fall. EXAM: LEFT HUMERUS - 2+ VIEW COMPARISON:  None. FINDINGS: There is no evidence of fracture or other focal bone lesions. Soft tissues are unremarkable. IMPRESSION: Negative. Electronically Signed   By: Titus Dubin M.D.   On: 11/03/2019 18:12   DG Hip Unilat W or Wo Pelvis 2-3 Views Left  Result Date:  11/03/2019 CLINICAL DATA:  Left hip pain after ground level fall. EXAM: DG HIP (WITH OR WITHOUT PELVIS) 2-3V LEFT COMPARISON:  CT abdomen pelvis dated August 25, 2019. FINDINGS: There is no evidence of hip fracture or dislocation. There is no evidence of arthropathy or other focal bone abnormality. IMPRESSION: Negative. Electronically Signed   By: Titus Dubin M.D.   On: 11/03/2019 18:13    Procedures Procedures (including critical care time)  Medications Ordered in ED Medications  oxyCODONE-acetaminophen (PERCOCET/ROXICET) 5-325 MG per tablet 1 tablet (1 tablet Oral Given 11/03/19 1756)    ED Course  I have reviewed the triage vital signs and the nursing notes.  Pertinent labs & imaging results that were available during my care of the patient were reviewed by me and considered in my medical decision making (see chart for details).    MDM Rules/Calculators/A&P                      49 year old lady who presented to ER with complaints of left-sided pain after mechanical fall while transferring.  On exam she is well-appearing, no visible deformity noted but did have some tenderness along reports of her left upper and lower extremities.  Plain films were all negative.  Her CT head was negative.  Suspect MSK strain.  Will discharge home.  After the discussed management above, the patient was determined to be safe for discharge.  The patient was in agreement with this plan and all questions regarding their care were answered.  ED return precautions were discussed and the patient will return to the ED with any significant worsening of condition.   Final Clinical  Impression(s) / ED Diagnoses Final diagnoses:  Muscle strain of chest wall, initial encounter  Fall, initial encounter    Rx / DC Orders ED Discharge Orders    None       Lucrezia Starch, MD 11/04/19 0004

## 2019-11-03 NOTE — Discharge Instructions (Addendum)
Recommend Tylenol, Motrin as needed for pain control.  Recommend return to ER for any recurrent falls, worsening chest pain, difficulty in breathing or other new concerning symptom.

## 2019-11-03 NOTE — ED Notes (Signed)
Patient reports left sided arm and leg paralysis from previous stroke. Patient reports she fell and is having pain to her left hip, bilateral knees and left arm.

## 2019-11-03 NOTE — Progress Notes (Signed)
TOC CM spoke to sister, Geetika Laborde about [facility] needs. States they have been trying to get pt in a ALF since November. She is requesting a call back tomorrow. Isidoro Donning RN CCM, [facility] ED TOC CM [phone]

## 2019-11-05 ENCOUNTER — Telehealth: Payer: Self-pay | Admitting: *Deleted

## 2019-11-05 NOTE — Telephone Encounter (Signed)
TOC CM call to sister, left HIPAA compliant message for return call Isidoro Donning RN CCM, [facility] ED TOC CM [phone]

## 2019-11-08 ENCOUNTER — Other Ambulatory Visit (INDEPENDENT_AMBULATORY_CARE_PROVIDER_SITE_OTHER): Payer: Self-pay | Admitting: Primary Care

## 2019-11-08 NOTE — Telephone Encounter (Signed)
Please fill without an end date. There are refills on the medication but it states to stop after 1/27.

## 2019-11-11 MED ORDER — GABAPENTIN 300 MG PO CAPS: 300.0000 mg | ORAL_CAPSULE | Freq: Three times a day (TID) | ORAL | 5 refills | Status: DC

## 2019-11-11 MED FILL — GABAPENTIN 300 MG CAPSULE: 300 | 30 days supply | Qty: 90 | Fill #0

## 2019-11-15 ENCOUNTER — Other Ambulatory Visit: Payer: Self-pay

## 2019-11-15 ENCOUNTER — Encounter (INDEPENDENT_AMBULATORY_CARE_PROVIDER_SITE_OTHER): Payer: Self-pay | Admitting: Primary Care

## 2019-11-15 ENCOUNTER — Encounter (HOSPITAL_COMMUNITY): Payer: Self-pay | Admitting: Emergency Medicine

## 2019-11-15 ENCOUNTER — Emergency Department (HOSPITAL_COMMUNITY): Payer: Medicaid Other

## 2019-11-15 ENCOUNTER — Inpatient Hospital Stay (HOSPITAL_COMMUNITY)
Admission: EM | Admit: 2019-11-15 | Discharge: 2019-11-23 | DRG: 175 | Disposition: A | Discharge status: Skilled Nursing Facility | Payer: Medicaid Other | Attending: Internal Medicine | Admitting: Internal Medicine

## 2019-11-15 ENCOUNTER — Ambulatory Visit (INDEPENDENT_AMBULATORY_CARE_PROVIDER_SITE_OTHER): Payer: Medicaid - Out of State | Admitting: Primary Care

## 2019-11-15 DIAGNOSIS — Z20822 Contact with and (suspected) exposure to covid-19: Secondary | ICD-10-CM | POA: Diagnosis present

## 2019-11-15 DIAGNOSIS — I82412 Acute embolism and thrombosis of left femoral vein: Secondary | ICD-10-CM | POA: Diagnosis present

## 2019-11-15 DIAGNOSIS — N179 Acute kidney failure, unspecified: Secondary | ICD-10-CM | POA: Diagnosis present

## 2019-11-15 DIAGNOSIS — J449 Chronic obstructive pulmonary disease, unspecified: Secondary | ICD-10-CM | POA: Diagnosis not present

## 2019-11-15 DIAGNOSIS — Z9981 Dependence on supplemental oxygen: Secondary | ICD-10-CM

## 2019-11-15 DIAGNOSIS — F329 Major depressive disorder, single episode, unspecified: Secondary | ICD-10-CM | POA: Diagnosis present

## 2019-11-15 DIAGNOSIS — Z79890 Hormone replacement therapy (postmenopausal): Secondary | ICD-10-CM

## 2019-11-15 DIAGNOSIS — I2699 Other pulmonary embolism without acute cor pulmonale: Secondary | ICD-10-CM

## 2019-11-15 DIAGNOSIS — Z7951 Long term (current) use of inhaled steroids: Secondary | ICD-10-CM

## 2019-11-15 DIAGNOSIS — Z9119 Patient's noncompliance with other medical treatment and regimen: Secondary | ICD-10-CM

## 2019-11-15 DIAGNOSIS — R296 Repeated falls: Secondary | ICD-10-CM | POA: Diagnosis present

## 2019-11-15 DIAGNOSIS — Z720 Tobacco use: Secondary | ICD-10-CM

## 2019-11-15 DIAGNOSIS — R269 Unspecified abnormalities of gait and mobility: Secondary | ICD-10-CM | POA: Diagnosis present

## 2019-11-15 DIAGNOSIS — R778 Other specified abnormalities of plasma proteins: Secondary | ICD-10-CM

## 2019-11-15 DIAGNOSIS — G8929 Other chronic pain: Secondary | ICD-10-CM | POA: Diagnosis present

## 2019-11-15 DIAGNOSIS — E119 Type 2 diabetes mellitus without complications: Secondary | ICD-10-CM

## 2019-11-15 DIAGNOSIS — K811 Chronic cholecystitis: Secondary | ICD-10-CM | POA: Diagnosis present

## 2019-11-15 DIAGNOSIS — I2609 Other pulmonary embolism with acute cor pulmonale: Secondary | ICD-10-CM

## 2019-11-15 DIAGNOSIS — Z825 Family history of asthma and other chronic lower respiratory diseases: Secondary | ICD-10-CM

## 2019-11-15 DIAGNOSIS — Z833 Family history of diabetes mellitus: Secondary | ICD-10-CM

## 2019-11-15 DIAGNOSIS — R7989 Other specified abnormal findings of blood chemistry: Secondary | ICD-10-CM | POA: Diagnosis present

## 2019-11-15 DIAGNOSIS — F17210 Nicotine dependence, cigarettes, uncomplicated: Secondary | ICD-10-CM | POA: Diagnosis present

## 2019-11-15 DIAGNOSIS — E663 Overweight: Secondary | ICD-10-CM | POA: Diagnosis present

## 2019-11-15 DIAGNOSIS — J9621 Acute and chronic respiratory failure with hypoxia: Secondary | ICD-10-CM | POA: Diagnosis present

## 2019-11-15 DIAGNOSIS — R000 Tachycardia, unspecified: Secondary | ICD-10-CM | POA: Diagnosis present

## 2019-11-15 DIAGNOSIS — I2781 Cor pulmonale (chronic): Secondary | ICD-10-CM | POA: Diagnosis present

## 2019-11-15 DIAGNOSIS — E872 Acidosis: Secondary | ICD-10-CM | POA: Diagnosis present

## 2019-11-15 DIAGNOSIS — Z8249 Family history of ischemic heart disease and other diseases of the circulatory system: Secondary | ICD-10-CM

## 2019-11-15 DIAGNOSIS — R945 Abnormal results of liver function studies: Secondary | ICD-10-CM | POA: Diagnosis not present

## 2019-11-15 DIAGNOSIS — Z79899 Other long term (current) drug therapy: Secondary | ICD-10-CM

## 2019-11-15 DIAGNOSIS — Z888 Allergy status to other drugs, medicaments and biological substances status: Secondary | ICD-10-CM

## 2019-11-15 DIAGNOSIS — Z794 Long term (current) use of insulin: Secondary | ICD-10-CM

## 2019-11-15 DIAGNOSIS — E1165 Type 2 diabetes mellitus with hyperglycemia: Secondary | ICD-10-CM | POA: Diagnosis present

## 2019-11-15 DIAGNOSIS — R432 Parageusia: Secondary | ICD-10-CM | POA: Diagnosis present

## 2019-11-15 DIAGNOSIS — R42 Dizziness and giddiness: Secondary | ICD-10-CM | POA: Diagnosis present

## 2019-11-15 DIAGNOSIS — Z91018 Allergy to other foods: Secondary | ICD-10-CM

## 2019-11-15 DIAGNOSIS — E039 Hypothyroidism, unspecified: Secondary | ICD-10-CM | POA: Diagnosis present

## 2019-11-15 DIAGNOSIS — N390 Urinary tract infection, site not specified: Secondary | ICD-10-CM | POA: Diagnosis present

## 2019-11-15 DIAGNOSIS — K5909 Other constipation: Secondary | ICD-10-CM | POA: Diagnosis present

## 2019-11-15 DIAGNOSIS — Z7902 Long term (current) use of antithrombotics/antiplatelets: Secondary | ICD-10-CM

## 2019-11-15 DIAGNOSIS — J4489 Other specified chronic obstructive pulmonary disease: Secondary | ICD-10-CM | POA: Diagnosis present

## 2019-11-15 DIAGNOSIS — I69354 Hemiplegia and hemiparesis following cerebral infarction affecting left non-dominant side: Secondary | ICD-10-CM

## 2019-11-15 DIAGNOSIS — N1831 Chronic kidney disease, stage 3a: Secondary | ICD-10-CM | POA: Diagnosis present

## 2019-11-15 DIAGNOSIS — Z8673 Personal history of transient ischemic attack (TIA), and cerebral infarction without residual deficits: Secondary | ICD-10-CM

## 2019-11-15 DIAGNOSIS — Z6829 Body mass index (BMI) 29.0-29.9, adult: Secondary | ICD-10-CM

## 2019-11-15 DIAGNOSIS — I10 Essential (primary) hypertension: Secondary | ICD-10-CM | POA: Diagnosis present

## 2019-11-15 HISTORY — DX: Other pulmonary embolism without acute cor pulmonale: I26.99

## 2019-11-15 LAB — URINALYSIS, ROUTINE W REFLEX MICROSCOPIC
Bilirubin Urine: NEGATIVE
Glucose, UA: 50 mg/dL — AB
Hgb urine dipstick: NEGATIVE
Ketones, ur: NEGATIVE mg/dL
Nitrite: NEGATIVE
Protein, ur: 100 mg/dL — AB
Specific Gravity, Urine: 1.025 (ref 1.005–1.030)
pH: 6 (ref 5.0–8.0)

## 2019-11-15 LAB — BASIC METABOLIC PANEL
Anion gap: 15 (ref 5–15)
BUN: 18 mg/dL (ref 6–20)
CO2: 23 mmol/L (ref 22–32)
Calcium: 9.1 mg/dL (ref 8.9–10.3)
Chloride: 96 mmol/L — ABNORMAL LOW (ref 98–111)
Creatinine, Ser: 1.47 mg/dL — ABNORMAL HIGH (ref 0.44–1.00)
GFR calc Af Amer: 49 mL/min — ABNORMAL LOW (ref >60)
GFR calc non Af Amer: 42 mL/min — ABNORMAL LOW (ref >60)
Glucose, Bld: 313 mg/dL — ABNORMAL HIGH (ref 70–99)
Potassium: 3.9 mmol/L (ref 3.5–5.1)
Sodium: 134 mmol/L — ABNORMAL LOW (ref 135–145)

## 2019-11-15 LAB — LACTIC ACID, PLASMA
Lactic Acid, Venous: 2.7 mmol/L (ref 0.5–1.9)
Lactic Acid, Venous: 2.3 mmol/L (ref 0.5–1.9)

## 2019-11-15 LAB — CBC
HCT: 48.3 % — ABNORMAL HIGH (ref 36.0–46.0)
Hemoglobin: 15.3 g/dL — ABNORMAL HIGH (ref 12.0–15.0)
MCH: 28.4 pg (ref 26.0–34.0)
MCHC: 31.7 g/dL (ref 30.0–36.0)
MCV: 89.6 fL (ref 80.0–100.0)
Platelets: 335 10*3/uL (ref 150–400)
RBC: 5.39 MIL/uL — ABNORMAL HIGH (ref 3.87–5.11)
RDW: 12 % (ref 11.5–15.5)
WBC: 14.6 10*3/uL — ABNORMAL HIGH (ref 4.0–10.5)
nRBC: 0 % (ref 0.0–0.2)

## 2019-11-15 LAB — RESPIRATORY PANEL BY RT PCR (FLU A&B, COVID)
Influenza A by PCR: NEGATIVE
Influenza B by PCR: NEGATIVE
SARS Coronavirus 2 by RT PCR: NEGATIVE

## 2019-11-15 LAB — HEPATIC FUNCTION PANEL
ALT: 18 U/L (ref 0–44)
AST: 34 U/L (ref 15–41)
Albumin: 3.4 g/dL — ABNORMAL LOW (ref 3.5–5.0)
Alkaline Phosphatase: 115 U/L (ref 38–126)
Bilirubin, Direct: 0.1 mg/dL (ref 0.0–0.2)
Indirect Bilirubin: 0.4 mg/dL (ref 0.3–0.9)
Total Bilirubin: 0.5 mg/dL (ref 0.3–1.2)
Total Protein: 6.6 g/dL (ref 6.5–8.1)

## 2019-11-15 LAB — TROPONIN I (HIGH SENSITIVITY)
Troponin I (High Sensitivity): 27 ng/L — ABNORMAL HIGH (ref <18)
Troponin I (High Sensitivity): 83 ng/L — ABNORMAL HIGH (ref <18)

## 2019-11-15 LAB — CBG MONITORING, ED: Glucose-Capillary: 224 mg/dL — ABNORMAL HIGH (ref 70–99)

## 2019-11-15 LAB — I-STAT BETA HCG BLOOD, ED (MC, WL, AP ONLY): I-stat hCG, quantitative: <5 m[IU]/mL (ref <5)

## 2019-11-15 LAB — APTT: aPTT: 30 seconds (ref 24–36)

## 2019-11-15 LAB — BRAIN NATRIURETIC PEPTIDE: B Natriuretic Peptide: 218.7 pg/mL — ABNORMAL HIGH (ref 0.0–100.0)

## 2019-11-15 LAB — PROTIME-INR
INR: 1.1 (ref 0.8–1.2)
Prothrombin Time: 13.7 seconds (ref 11.4–15.2)

## 2019-11-15 LAB — D-DIMER, QUANTITATIVE: D-Dimer, Quant: 1.9 ug/mL-FEU — ABNORMAL HIGH (ref 0.00–0.50)

## 2019-11-15 MED ORDER — BUPROPION HCL ER (SR) 100 MG PO TB12: 100.0000 mg | ORAL_TABLET | Freq: Two times a day (BID) | ORAL | Status: DC

## 2019-11-15 MED ORDER — VANCOMYCIN HCL IN DEXTROSE 1-5 GM/200ML-% IV SOLN: 1000.0000 mg | Freq: Once | INTRAVENOUS | Status: DC

## 2019-11-15 MED ORDER — MOMETASONE FURO-FORMOTEROL FUM 200-5 MCG/ACT IN AERO
2.0000 | INHALATION_SPRAY | Freq: Two times a day (BID) | RESPIRATORY_TRACT | Status: DC
  Administered 2019-11-16 – 2019-11-17 (×3): 2 via RESPIRATORY_TRACT
  Filled 2019-11-15: qty 8.8

## 2019-11-15 MED ORDER — SODIUM CHLORIDE 0.9% FLUSH: 3.0000 mL | Freq: Once | INTRAVENOUS | Status: DC

## 2019-11-15 MED ORDER — FLUTICASONE FUROATE-VILANTEROL 100-25 MCG/INH IN AEPB
1.0000 | INHALATION_SPRAY | Freq: Every day | RESPIRATORY_TRACT | Status: DC
  Administered 2019-11-17: 1 via RESPIRATORY_TRACT
  Filled 2019-11-15 (×2): qty 28

## 2019-11-15 MED ORDER — GABAPENTIN 300 MG PO CAPS
300.0000 mg | ORAL_CAPSULE | Freq: Three times a day (TID) | ORAL | Status: DC
  Administered 2019-11-16: 300 mg via ORAL
  Filled 2019-11-15: qty 1

## 2019-11-15 MED ORDER — MORPHINE SULFATE (PF) 4 MG/ML IV SOLN
4.0000 mg | Freq: Once | INTRAVENOUS | Status: AC
  Administered 2019-11-15: 22:00:00 4 mg via INTRAVENOUS
  Filled 2019-11-15: qty 1

## 2019-11-15 MED ORDER — HEPARIN (PORCINE) 25000 UT/250ML-% IV SOLN
1700.0000 [IU]/h | INTRAVENOUS | Status: AC
  Administered 2019-11-15 – 2019-11-17 (×2): 1400 [IU]/h via INTRAVENOUS
  Administered 2019-11-18: 1600 [IU]/h via INTRAVENOUS
  Administered 2019-11-18 – 2019-11-19 (×3): 1700 [IU]/h via INTRAVENOUS
  Filled 2019-11-15 (×7): qty 250

## 2019-11-15 MED ORDER — METHYLPHENIDATE HCL 5 MG PO TABS: 5.0000 mg | ORAL_TABLET | Freq: Every day | ORAL | Status: DC

## 2019-11-15 MED ORDER — SODIUM CHLORIDE 0.9 % IV SOLN
2.0000 g | Freq: Once | INTRAVENOUS | Status: AC
  Administered 2019-11-15: 19:00:00 2 g via INTRAVENOUS
  Filled 2019-11-15: qty 2

## 2019-11-15 MED ORDER — METRONIDAZOLE IN NACL 5-0.79 MG/ML-% IV SOLN
500.0000 mg | Freq: Once | INTRAVENOUS | Status: AC
  Administered 2019-11-15: 19:00:00 500 mg via INTRAVENOUS
  Filled 2019-11-15: qty 100

## 2019-11-15 MED ORDER — HEPARIN BOLUS VIA INFUSION
5500.0000 [IU] | Freq: Once | INTRAVENOUS | Status: AC
  Administered 2019-11-15: 22:00:00 5500 [IU] via INTRAVENOUS
  Filled 2019-11-15: qty 5500

## 2019-11-15 MED ORDER — LEVOTHYROXINE SODIUM 50 MCG PO TABS
50.0000 ug | ORAL_TABLET | Freq: Every day | ORAL | Status: DC
  Administered 2019-11-16 – 2019-11-23 (×8): 50 ug via ORAL
  Filled 2019-11-15 (×8): qty 1

## 2019-11-15 MED ORDER — VANCOMYCIN HCL 1250 MG/250ML IV SOLN: 1250.0000 mg | INTRAVENOUS | Status: DC

## 2019-11-15 MED ORDER — MIRTAZAPINE 15 MG PO TABS
45.0000 mg | ORAL_TABLET | Freq: Every day | ORAL | Status: DC
  Administered 2019-11-16: 45 mg via ORAL
  Filled 2019-11-15: qty 3
  Filled 2019-11-15: qty 1

## 2019-11-15 MED ORDER — INSULIN ASPART 100 UNIT/ML ~~LOC~~ SOLN
0.0000 [IU] | SUBCUTANEOUS | Status: DC
  Administered 2019-11-15 – 2019-11-16 (×4): 5 [IU] via SUBCUTANEOUS
  Administered 2019-11-16 – 2019-11-17 (×3): 2 [IU] via SUBCUTANEOUS
  Administered 2019-11-17: 08:00:00 3 [IU] via SUBCUTANEOUS
  Administered 2019-11-17: 5 [IU] via SUBCUTANEOUS
  Administered 2019-11-17: 13:00:00 2 [IU] via SUBCUTANEOUS
  Administered 2019-11-18: 09:00:00 3 [IU] via SUBCUTANEOUS
  Administered 2019-11-18: 18:00:00 2 [IU] via SUBCUTANEOUS
  Administered 2019-11-18 (×2): 3 [IU] via SUBCUTANEOUS

## 2019-11-15 MED ORDER — MORPHINE SULFATE (PF) 4 MG/ML IV SOLN
4.0000 mg | Freq: Once | INTRAVENOUS | Status: AC
  Administered 2019-11-15: 17:00:00 4 mg via INTRAVENOUS
  Filled 2019-11-15: qty 1

## 2019-11-15 MED ORDER — ATORVASTATIN CALCIUM 80 MG PO TABS
80.0000 mg | ORAL_TABLET | Freq: Every day | ORAL | Status: DC
  Administered 2019-11-17 – 2019-11-23 (×7): 80 mg via ORAL
  Filled 2019-11-15 (×7): qty 1

## 2019-11-15 MED ORDER — SODIUM CHLORIDE 0.9 % IV BOLUS (SEPSIS)
1000.0000 mL | Freq: Once | INTRAVENOUS | Status: AC
  Administered 2019-11-15: 19:00:00 1000 mL via INTRAVENOUS

## 2019-11-15 MED ORDER — CLOPIDOGREL BISULFATE 75 MG PO TABS
75.0000 mg | ORAL_TABLET | Freq: Every day | ORAL | Status: DC
  Administered 2019-11-17 – 2019-11-23 (×7): 75 mg via ORAL
  Filled 2019-11-15 (×7): qty 1

## 2019-11-15 MED ORDER — INSULIN GLARGINE 100 UNIT/ML ~~LOC~~ SOLN
15.0000 [IU] | Freq: Every day | SUBCUTANEOUS | Status: DC
  Administered 2019-11-16 – 2019-11-23 (×8): 15 [IU] via SUBCUTANEOUS
  Filled 2019-11-15 (×8): qty 0.15

## 2019-11-15 MED ORDER — NICOTINE 21 MG/24HR TD PT24
21.0000 mg | MEDICATED_PATCH | Freq: Every day | TRANSDERMAL | Status: DC
  Administered 2019-11-17 – 2019-11-23 (×7): 21 mg via TRANSDERMAL
  Filled 2019-11-15 (×7): qty 1

## 2019-11-15 MED ORDER — CYCLOBENZAPRINE HCL 10 MG PO TABS
10.0000 mg | ORAL_TABLET | Freq: Three times a day (TID) | ORAL | Status: DC | PRN
  Administered 2019-11-16: 04:00:00 10 mg via ORAL
  Filled 2019-11-15: qty 1

## 2019-11-15 MED ORDER — HYDROCODONE-ACETAMINOPHEN 5-325 MG PO TABS
1.0000 | ORAL_TABLET | ORAL | Status: DC | PRN
  Administered 2019-11-16: 2 via ORAL
  Filled 2019-11-15: qty 2

## 2019-11-15 MED ORDER — SODIUM CHLORIDE 0.9 % IV SOLN
2.0000 g | Freq: Two times a day (BID) | INTRAVENOUS | Status: DC
  Administered 2019-11-16 – 2019-11-18 (×5): 2 g via INTRAVENOUS
  Filled 2019-11-15 (×6): qty 2

## 2019-11-15 MED ORDER — ALBUTEROL SULFATE (2.5 MG/3ML) 0.083% IN NEBU: 2.5000 mg | INHALATION_SOLUTION | Freq: Four times a day (QID) | RESPIRATORY_TRACT | Status: DC | PRN

## 2019-11-15 MED ORDER — INSULIN ASPART 100 UNIT/ML ~~LOC~~ SOLN: 0.0000 [IU] | SUBCUTANEOUS | Status: DC

## 2019-11-15 MED ORDER — VANCOMYCIN HCL 1250 MG/250ML IV SOLN
1250.0000 mg | INTRAVENOUS | Status: DC
  Administered 2019-11-15: 19:00:00 1250 mg via INTRAVENOUS
  Filled 2019-11-15: qty 250

## 2019-11-15 MED ORDER — IOHEXOL 350 MG/ML SOLN
100.0000 mL | Freq: Once | INTRAVENOUS | Status: AC | PRN
  Administered 2019-11-15: 21:00:00 100 mL via INTRAVENOUS

## 2019-11-15 MED ORDER — ALPRAZOLAM 0.5 MG PO TABS
0.5000 mg | ORAL_TABLET | Freq: Three times a day (TID) | ORAL | Status: DC | PRN
  Administered 2019-11-16: 04:00:00 0.5 mg via ORAL
  Filled 2019-11-15: qty 1

## 2019-11-15 MED ORDER — SODIUM CHLORIDE 0.9 % IV SOLN: INTRAVENOUS | Status: AC

## 2019-11-15 MED ORDER — ACETAMINOPHEN 650 MG RE SUPP: 650.0000 mg | Freq: Four times a day (QID) | RECTAL | Status: DC | PRN

## 2019-11-15 MED ORDER — HYDROCODONE-ACETAMINOPHEN 7.5-325 MG PO TABS: 1.0000 | ORAL_TABLET | Freq: Four times a day (QID) | ORAL | Status: DC | PRN

## 2019-11-15 MED ORDER — LACTATED RINGERS IV SOLN: INTRAVENOUS | Status: DC

## 2019-11-15 MED ORDER — BUPROPION HCL ER (SR) 100 MG PO TB12
100.0000 mg | ORAL_TABLET | Freq: Two times a day (BID) | ORAL | Status: DC
  Administered 2019-11-16 – 2019-11-23 (×14): 100 mg via ORAL
  Filled 2019-11-15 (×16): qty 1

## 2019-11-15 MED ORDER — ACETAMINOPHEN 325 MG PO TABS
650.0000 mg | ORAL_TABLET | Freq: Four times a day (QID) | ORAL | Status: DC | PRN
  Administered 2019-11-16 – 2019-11-17 (×2): 650 mg via ORAL
  Filled 2019-11-15 (×3): qty 2

## 2019-11-15 MED ORDER — BUDESONIDE 0.25 MG/2ML IN SUSP
0.2500 mg | Freq: Two times a day (BID) | RESPIRATORY_TRACT | Status: DC
  Administered 2019-11-16 – 2019-11-17 (×3): 0.25 mg via RESPIRATORY_TRACT
  Filled 2019-11-15 (×2): qty 2

## 2019-11-15 MED ORDER — SODIUM CHLORIDE 0.9 % IV BOLUS (SEPSIS)
1000.0000 mL | Freq: Once | INTRAVENOUS | Status: AC
  Administered 2019-11-15: 16:00:00 1000 mL via INTRAVENOUS

## 2019-11-15 NOTE — ED Provider Notes (Addendum)
[name] EMERGENCY DEPARTMENT Provider Note   CSN: [id number] Arrival date & time: 11/15/19  1433     History Chief Complaint  Patient presents with  . Chest Pain    Karla Hoover is a 49 y.o. female.  Patient is a 49 year old female who lives at home with her sister with past with history of COPD, diabetes, asthma, hypertension, previous stroke affecting the left side presenting to the emergency department for chest pain after fall.  Patient reports that she has had this pain in her chest since her initial fall on 27 January.  She was seen in the emergency department that day and diagnosed with a musculoskeletal strain.  Reports that she was healing and doing better until she had another fall today as well.  Reports she was trying to get up and pivoted to get to the bathroom when she fell and landed on her left side.  Patient reports that she does not use a walker or assistive device to walk at home, she just gets up and pivots herself to the wall and uses another person or wall for assistance.  She reports that she did not hit her head and did not pass out.  She reports soreness in the left side of her chest and body.  She reports that she was supposed to be on home oxygen at home but that since her last admission she left them in her old apartment after moving in with her sister and no longer has it.  Patient admits that she does not take care of herself and that she usually drinks coffee and soda and does not eat healthy and sometimes forgets to take her medications.        Past Medical History:  Diagnosis Date  . Asthma   . COPD (chronic obstructive pulmonary disease) ([place])   . Diabetes mellitus without complication ([place])   . Hypertension   . Stroke [facility])     Patient Active Problem List   Diagnosis Date Noted  . Pulmonary embolism ([place]) 11/15/2019  . Essential hypertension 05/04/2009  . CHRONIC OBSTRUCTIVE PULMONARY DISEASE 05/04/2009    History  reviewed. No pertinent surgical history.   OB History   No obstetric history on file.     Family History  Problem Relation Age of Onset  . Diabetes Mother   . COPD Sister   . Heart failure Sister     Social History   Tobacco Use  . Smoking status: Current Every Day Smoker    Packs/day: 0.50    Types: Cigarettes  . Smokeless tobacco: Never Used  Substance Use Topics  . Alcohol use: No  . Drug use: No    Home Medications Prior to Admission medications   Medication Sig Start Date End Date Taking? Authorizing Provider  acetaminophen (TYLENOL) 325 MG tablet Take 325 mg by mouth every 6 (six) hours as needed for pain. 07/23/19 08/23/20  [provider]  albuterol (VENTOLIN HFA) 108 (90 Base) MCG/ACT inhaler Inhale 2 puffs into the lungs every 6 (six) hours as needed for wheezing or shortness of breath. 10/04/19   Kerin Perna, NP  ALPRAZolam Duanne Moron) 0.5 MG tablet Take 0.5 mg by mouth every 8 (eight) hours as needed for anxiety.    [provider]  amLODipine-benazepril (LOTREL) 10-40 MG capsule Take 1 capsule by mouth daily. 10/04/19   Kerin Perna, NP  atorvastatin (LIPITOR) 80 MG tablet Take 1 tablet (80 mg total) by mouth daily. 10/04/19  Kerin Perna, NP  budesonide-formoterol (SYMBICORT) 160-4.5 MCG/ACT inhaler Inhale 2 puffs into the lungs 2 (two) times daily. 10/04/19   Kerin Perna, NP  buPROPion (WELLBUTRIN SR) 100 MG 12 hr tablet Take 1 tablet (100 mg total) by mouth 2 (two) times daily. 10/04/19   Kerin Perna, NP  clopidogrel (PLAVIX) 75 MG tablet Take 1 tablet (75 mg total) by mouth daily. 10/04/19   Kerin Perna, NP  cyclobenzaprine (FLEXERIL) 10 MG tablet Take 1 tablet (10 mg total) by mouth 3 (three) times daily as needed for muscle spasms. 10/04/19   Kerin Perna, NP  diclofenac (VOLTAREN) 50 MG EC tablet Take 1 tablet (50 mg total) by mouth every 8 (eight) hours as needed for mild pain. 09/09/19    Jacqlyn Larsen, PA-C  docusate sodium (COLACE) 100 MG capsule Take 100 mg by mouth 2 (two) times daily as needed for constipation. 08/23/19   [provider]  Fluticasone-Umeclidin-Vilant (TRELEGY ELLIPTA) 100-62.5-25 MCG/INH AEPB Inhale 1 puff into the lungs daily. 10/04/19   Kerin Perna, NP  gabapentin (NEURONTIN) 300 MG capsule Take 1 capsule (300 mg total) by mouth 3 (three) times daily. 11/11/19 12/11/19  Kerin Perna, NP  HYDROcodone-acetaminophen (NORCO) 7.5-325 MG tablet Take 1 tablet by mouth every 6 (six) hours as needed for pain. 08/23/19   [provider]  ibuprofen (ADVIL) 800 MG tablet Take 1 tablet (800 mg total) by mouth every 8 (eight) hours as needed. 10/04/19   Kerin Perna, NP  Insulin Glargine (BASAGLAR KWIKPEN) 100 UNIT/ML SOPN Inject 0.15 mLs (15 Units total) into the skin daily. 10/04/19   Kerin Perna, NP  insulin lispro (HUMALOG KWIKPEN) 100 UNIT/ML KwikPen Inject 0.01-0.05 mLs (1-5 Units total) into the skin 3 (three) times daily as needed. PRN BLOOD SUGAR 151-200 1 unit 201-250 2 units 251-300 3 units 301-350 4 units 351-399 5 units Over 400 call MD 10/04/19   Charlott Rakes, MD  Insulin Pen Needle (PEN NEEDLES) 32G X 4 MM MISC 1 each by Does not apply route as needed. 10/04/19   Kerin Perna, NP  ipratropium-albuterol (DUONEB) 0.5-2.5 (3) MG/3ML SOLN Inhale 3 mLs into the lungs 4 (four) times daily as needed. 10/04/19   Kerin Perna, NP  levothyroxine (SYNTHROID) 50 MCG tablet Take 1 tablet (50 mcg total) by mouth daily. 10/07/19   Kerin Perna, NP  lidocaine (LIDODERM) 5 % Place 1 patch onto the skin every 12 (twelve) hours as needed. 09/09/19   Jacqlyn Larsen, PA-C  methylphenidate (RITALIN) 5 MG tablet Take 5 mg by mouth daily.     [provider]  metoprolol tartrate (LOPRESSOR) 25 MG tablet Take 1 tablet (25 mg total) by mouth 2 (two) times daily. 10/04/19   Kerin Perna, NP  mirtazapine  (REMERON) 45 MG tablet Take 1 tablet (45 mg total) by mouth at bedtime. 10/04/19   Kerin Perna, NP  naloxone [facility]) 4 MG/0.1ML LIQD nasal spray kit Place 1 spray into the nose once as needed. overdose 06/03/19   [provider]  nicotine (NICODERM CQ - DOSED IN MG/24 HOURS) 21 mg/24hr patch Place 21 mg onto the skin as needed. Smoking sensation 03/12/18   [provider]  NIFEdipine (PROCARDIA-XL/NIFEDICAL-XL) 30 MG 24 hr tablet Take 1 tablet (30 mg total) by mouth daily. 10/04/19   Kerin Perna, NP  polyethylene glycol (MIRALAX / GLYCOLAX) 17 g packet Take 17 g by mouth 2 (two)  times daily as needed. 09/09/19   Jacqlyn Larsen, PA-C  tetrahydrozoline 0.05 % ophthalmic solution Place 1 drop into both eyes 4 (four) times daily as needed. Eye irritation 07/23/19   [provider]    Allergies    Guaifenesin, Levaquin [levofloxacin in d5w], Strawberry extract, and Kiwi extract  Review of Systems   Review of Systems  Constitutional: Negative for appetite change, diaphoresis, fatigue and fever.  HENT: Negative for congestion and sore throat.   Eyes: Negative for visual disturbance.  Respiratory: Negative for cough and shortness of breath.   Cardiovascular: Positive for chest pain. Negative for palpitations and leg swelling.  Gastrointestinal: Negative for abdominal pain, diarrhea, nausea and vomiting.  Genitourinary: Negative for decreased urine volume and dysuria.       Urinary incontinance  Musculoskeletal: Positive for back pain. Negative for arthralgias.  Skin: Negative for rash and wound.  Neurological: Negative for dizziness, weakness, light-headedness, numbness and headaches.    Physical Exam Updated Vital Signs BP (!) 133/109   Pulse (!) 136   Temp 98.4 F (36.9 C) (Oral)   Resp (!) 23   Ht 5' 6.5" (1.689 m)   Wt 85 kg   LMP 10/20/2019   SpO2 95%   BMI 29.79 kg/m   Physical Exam Vitals and nursing note reviewed.  Constitutional:       General: She is not in acute distress.    Appearance: Normal appearance. She is well-developed. She is not ill-appearing, toxic-appearing or diaphoretic.     Comments: Appears well, pleasant, speaking full sentences  HENT:     Head: Normocephalic.  Eyes:     Conjunctiva/sclera: Conjunctivae normal.     Pupils: Pupils are equal, round, and reactive to light.  Cardiovascular:     Rate and Rhythm: Regular rhythm. Tachycardia present.  Pulmonary:     Effort: Pulmonary effort is normal.     Breath sounds: Normal breath sounds.  Abdominal:     General: Bowel sounds are normal.     Palpations: Abdomen is soft.     Tenderness: There is no guarding.  Musculoskeletal:     Cervical back: Normal range of motion and neck supple.     Right lower leg: No tenderness. No edema.     Left lower leg: No tenderness. Edema present.  Skin:    General: Skin is warm and dry.     Findings: No ecchymosis, erythema or rash.     Nails: There is no clubbing.  Neurological:     General: No focal deficit present.     Mental Status: She is alert.  Psychiatric:        Mood and Affect: Mood normal.     ED Results / Procedures / Treatments   Labs (all labs ordered are listed, but only abnormal results are displayed) Labs Reviewed  BASIC METABOLIC PANEL - Abnormal; Notable for the following components:      Result Value   Sodium 134 (*)    Chloride 96 (*)    Glucose, Bld 313 (*)    Creatinine, Ser 1.47 (*)    GFR calc non Af Amer 42 (*)    GFR calc Af Amer 49 (*)    All other components within normal limits  CBC - Abnormal; Notable for the following components:   WBC 14.6 (*)    RBC 5.39 (*)    Hemoglobin 15.3 (*)    HCT 48.3 (*)    All other components within normal limits  URINALYSIS,  ROUTINE W REFLEX MICROSCOPIC - Abnormal; Notable for the following components:   APPearance HAZY (*)    Glucose, UA 50 (*)    Protein, ur 100 (*)    Leukocytes,Ua SMALL (*)    Bacteria, UA MANY (*)    All other  components within normal limits  LACTIC ACID, PLASMA - Abnormal; Notable for the following components:   Lactic Acid, Venous 2.7 (*)    All other components within normal limits  LACTIC ACID, PLASMA - Abnormal; Notable for the following components:   Lactic Acid, Venous 2.3 (*)    All other components within normal limits  D-DIMER, QUANTITATIVE (NOT AT [facility]) - Abnormal; Notable for the following components:   D-Dimer, Quant 1.90 (*)    All other components within normal limits  HEPATIC FUNCTION PANEL - Abnormal; Notable for the following components:   Albumin 3.4 (*)    All other components within normal limits  BRAIN NATRIURETIC PEPTIDE - Abnormal; Notable for the following components:   B Natriuretic Peptide 218.7 (*)    All other components within normal limits  TROPONIN I (HIGH SENSITIVITY) - Abnormal; Notable for the following components:   Troponin I (High Sensitivity) 27 (*)    All other components within normal limits  TROPONIN I (HIGH SENSITIVITY) - Abnormal; Notable for the following components:   Troponin I (High Sensitivity) 83 (*)    All other components within normal limits  RESPIRATORY PANEL BY RT PCR (FLU A&B, COVID)  CULTURE, BLOOD (ROUTINE X 2)  CULTURE, BLOOD (ROUTINE X 2)  URINE CULTURE  APTT  PROTIME-INR  HEPARIN LEVEL (UNFRACTIONATED)  CBC  I-STAT BETA HCG BLOOD, ED ([facility], [facility], [facility] ONLY)  TROPONIN I (HIGH SENSITIVITY)    EKG EKG Interpretation  Date/Time:  Monday November 15 2019 14:36:40 EST Ventricular Rate:  149 PR Interval:    QRS Duration: 76 QT Interval:  342 QTC Calculation: 538 R Axis:   75 Text Interpretation: Sinus tachycardia with short PR Low voltage QRS Borderline ECG No STEMI, prolonged QTc Confirmed by Octaviano Glow [phone]) on 11/15/2019 3:12:32 PM   Radiology DG Chest 2 View  Result Date: 11/15/2019 CLINICAL DATA:  Chest pain and shortness of breath EXAM: CHEST - 2 VIEW COMPARISON:  November 03, 2019 FINDINGS: There is mild left base  atelectasis. The lungs elsewhere are clear. Heart size and pulmonary vascular normal. No adenopathy. No pneumothorax. There is mild degenerative change in the thoracic spine. IMPRESSION: Mild left base atelectasis. Lungs elsewhere clear. Cardiac silhouette within normal limits. Electronically Signed   By: Lowella Grip III M.D.   On: 11/15/2019 15:15   CT Angio Chest PE W and/or Wo Contrast  Result Date: 11/15/2019 CLINICAL DATA:  Shortness of breath, chest pain EXAM: CT ANGIOGRAPHY CHEST WITH CONTRAST TECHNIQUE: Multidetector CT imaging of the chest was performed using the standard protocol during bolus administration of intravenous contrast. Multiplanar CT image reconstructions and MIPs were obtained to evaluate the vascular anatomy. CONTRAST:  160m OMNIPAQUE IOHEXOL 350 MG/ML SOLN COMPARISON:  06/21/2012 FINDINGS: Cardiovascular: Bilateral moderate to large volume pulmonary emboli noted involving arterial branches in all lobes of both lungs. Evidence of right heart strain. RV/LV ratio 2.0. Heart is normal size. Aorta normal caliber. Mediastinum/Nodes: No mediastinal, hilar, or axillary adenopathy. Trachea and esophagus are unremarkable. Thyroid unremarkable. Lungs/Pleura: Left base atelectasis.  No effusions. Upper Abdomen: Imaging into the upper abdomen shows no acute findings. Musculoskeletal: Chest wall soft tissues are unremarkable. No acute bony abnormality. Review of the MIP  images confirms the above findings. IMPRESSION: Positive for acute PE with CT evidence of right heart strain (RV/LV Ratio = 2.0) consistent with at least submassive (intermediate risk) PE. The presence of right heart strain has been associated with an increased risk of morbidity and mortality. Please activate Code PE by paging [phone]. Critical Value/emergent results were called by telephone at the time of interpretation on 11/15/2019 at 9:31 pm to provider [facility] , who verbally acknowledged these results.  Electronically Signed   By: Rolm Baptise M.D.   On: 11/15/2019 21:32    Procedures Procedures (including critical care time)  Medications Ordered in ED Medications  sodium chloride flush (NS) 0.9 % injection 3 mL (has no administration in time range)  ceFEPIme (MAXIPIME) 2 g in sodium chloride 0.9 % 100 mL IVPB (has no administration in time range)  heparin ADULT infusion 100 units/mL (25000 units/236m sodium chloride 0.45%) (1,400 Units/hr Intravenous New Bag/Given 11/15/19 2156)  budesonide (PULMICORT) nebulizer solution 0.25 mg (has no administration in time range)  fluticasone furoate-vilanterol (BREO ELLIPTA) 100-25 MCG/INH 1 puff (has no administration in time range)  insulin aspart (novoLOG) injection 0-15 Units (has no administration in time range)  levothyroxine (SYNTHROID) tablet 50 mcg (has no administration in time range)  mirtazapine (REMERON) tablet 45 mg (has no administration in time range)  cyclobenzaprine (FLEXERIL) tablet 10 mg (has no administration in time range)  HYDROcodone-acetaminophen (NORCO) 7.5-325 MG per tablet 1 tablet (has no administration in time range)  buPROPion (WELLBUTRIN SR) 12 hr tablet 100 mg (has no administration in time range)  lactated ringers infusion (has no administration in time range)  sodium chloride 0.9 % bolus 1,000 mL (0 mLs Intravenous Stopped 11/15/19 1830)  morphine 4 MG/ML injection 4 mg (4 mg Intravenous Given 11/15/19 1714)  sodium chloride 0.9 % bolus 1,000 mL (0 mLs Intravenous Stopped 11/15/19 1946)  ceFEPIme (MAXIPIME) 2 g in sodium chloride 0.9 % 100 mL IVPB (0 g Intravenous Stopped 11/15/19 1910)  metroNIDAZOLE (FLAGYL) IVPB 500 mg (0 mg Intravenous Stopped 11/15/19 1950)  iohexol (OMNIPAQUE) 350 MG/ML injection 100 mL (100 mLs Intravenous Contrast Given 11/15/19 2127)  heparin bolus via infusion 5,500 Units (5,500 Units Intravenous Bolus from Bag 11/15/19 2156)  morphine 4 MG/ML injection 4 mg (4 mg Intravenous Given 11/15/19 2216)    ED  Course  I have reviewed the triage vital signs and the nursing notes.  Pertinent labs & imaging results that were available during my care of the patient were reviewed by me and considered in my medical decision making (see chart for details).  Clinical Course as of Nov 14 2321  [facility]  1803 Patient was seen by myself as well as PA provider.  Briefly this is a 49year old female presenting to the emergency department with complaint of generalized body aches and pains, as well as chest pain.  Chest pain is located in epigastrium and feels like pressure.  It is worse with inspiration also with palpitation of the front of her chest.  Here she was found to be tachycardic and mildly tachypneic with an elevated white blood cell count a lactate of 2.7.  Sepsis alert was called.  She will be given IV fluids and IV antibiotics.  Is not clear what her source is at this time.  Her Covid and flu testing was negative.  I have a low suspicion for meningitis, and she does have photophobia or neck stiffness or primary headache is her complaint.  Do not  believe she has an acute surgical abdomen.  We will follow up in the rest of her test.  Also recheck her heart rate after giving her IV fluids, as I do suspect she may be dehydrated.  Is tacky mucous membranes on exam.  She tells me she only drinks coffee and soda.   [MT]  1804 She has mildly elevated troponin the set of discounted tachycardia may be demand ischemia related.  Have a lower suspicion for acute coronary syndrome based on this presentation.   [MT]  2021 Patient presenting with left-sided chest pain after falling on her left side today.  History of COPD, hypertension, stroke and is noncompliant with medication.  Tachycardic to 150 initially.  Continues to have left side pain as well as tachycardia despite 2 L of fluids.  Code sepsis was initiated due to elevated lactic acid and white count.  No etiology found at this point. Borad spectrum ABX started.   Patient appears well despite her heart rate.  Heart rate regular on EKG.  Awaiting CTA to rule out pulmonary embolism.  Discussed with cardiology and they agree with plan at this time.  No additional treatments per cardiology Dr. Aundra Dubin at this time.   [KM]  2132 Bilateral PE and R heart strain   [KM]  2155 Patient informed of diagnosis and starting heparin now   [MT]  2158 Spoke with Dr. Lucile Shutters pulmonology from [facility] who is going to consult on the patient.    [KM]  2243 Spoke pulmonolgoy/ ICU who reports patient should be admitted to step down. Will need echocardiogram before IR will plan procedure.    [KM]  2319 Spoke with Dr. Roel Cluck who will admit patient to SDU   [KM]    Clinical Course User Index [KM] Alveria Apley, PA-C [MT] Langston Masker Carola Rhine, MD   MDM Rules/Calculators/A&P                      CRITICAL CARE Performed by: Alveria Apley   Total critical care time: 35 minutes  Critical care time was exclusive of separately billable procedures and treating other patients.  Critical care was necessary to treat or prevent imminent or life-threatening deterioration.  Critical care was time spent personally by me on the following activities: development of treatment plan with patient and/or surrogate as well as nursing, discussions with consultants, evaluation of patient's response to treatment, examination of patient, obtaining history from patient or surrogate, ordering and performing treatments and interventions, ordering and review of laboratory studies, ordering and review of radiographic studies, pulse oximetry and re-evaluation of patient's condition.  Final Clinical Impression(s) / ED Diagnoses Final diagnoses:  None    Rx / DC Orders ED Discharge Orders    None       Kristine Royal 11/15/19 2212    Alveria Apley, PA-C 11/15/19 2323    Wyvonnia Dusky, MD 11/16/19 1322

## 2019-11-15 NOTE — Consult Note (Signed)
NAME:  Karla Hoover, MRN:  [id number], DOB:  1971-07-03, LOS: 0 ADMISSION DATE:  11/15/2019, CONSULTATION DATE:  11/15/19 REFERRING MD:  Langston Masker - EM, CHIEF COMPLAINT:  Chest pain  Brief History   49 yo F presenting with chest pain, found to have PE. RV:LV 2.0, PCCM consulted   History of present illness    49 yo F PMH COPD, DM, HTN, prior CVA who presented to ED with CC chest pain. Patient reports that chest pain began after a hall on 11/03/19, for which she previously presented to ED and was dx with musculoskeletal strain. Patient evidently sustained second fall today, PTA, while pivoting to bathroom. Patient fell, landing on L side and feeling residual soreness and pain on L side of body as well as chest.   In ED, patient initial SBP 90, prompting code sepsis initiation, receiving 2L IVF and initiation of vnac, cefepime, flagyl. Subsequently HDS. Further workup in ED for chest pain revealed bilateral PE on CTA chest and patient was started on heparin per pharmacy.   Past Medical History  COPD Depression  HTN DM  Chronic pain Hypothyroidism   Significant Hospital Events   11/15/19 presents to ED with Chest pain, found to have PE   Consults:  PCCM   Procedures:    Significant Diagnostic Tests:  CTA chest 2/8> bilateral pulmonary emboli involving arterial branches in all lobes of both lungs. RV/LV 2.0  Micro Data:  SARS Cov2 2/8> neg  Flu A/B 2/8 > neg BCx 2/8>   Antimicrobials:  Cefepime 2/8 Flagyl 2/8 Vanc 2/8   Interim history/subjective:  HDS, on 2LNC   Objective   Blood pressure (!) 116/97, pulse (!) 136, temperature 98.4 F (36.9 C), temperature source Oral, resp. rate 12, height 5' 6.5" (1.689 m), weight 85 kg, last menstrual period 10/20/2019, SpO2 96 %.       No intake or output data in the 24 hours ending 11/15/19 2213 Filed Weights   11/15/19 1832  Weight: 85 kg    Examination: General: Chronically ill appearing adult F, appears older than states  age in mild distress due to pain and subjective SOB  HENT: NCAT pink mmm trachea midline anicteric sclear Lungs: CTA bilaterally, no accessory muscle use, symmetrical chest expansion Cardiovascular: tachycardic rate regular rhythm. s1s2 no rgm Abdomen: soft round ndnt + bowel sound Extremities: LLE edema and erythema. LUE contracture. o cyanosis or clubbing  Neuro: Awake alert. Slightly unclear and slightly pressured speech. PERRL.  GU: defer   Resolved Hospital Problem list     Assessment & Plan:   Bilateral Pulmonary Embolism, submassive -hemodynamically stable, RV:LV 2.0 which has triggered PCCM consultation -PESI 77  P -Stable for admission to SDU with hospitalist -Continue heparin per pharmacy  -ECHO  -continuous tele monitoring  -check BNP  -BLE dopplers  -trend trop  Prolonged qtc P -PRN ECG -tele monitoring -avoid qt prolonging meds   Hypotension, improved  -initial response to 2L IVF, became hypotensive again following '4mg'$  morphine -code sepsis initiated in ED however I have low suspicion for sepsis  P -tele monitoring -MAP goal > 65  -follow up cx data and de-escalate abx  -monitor for s/sx of shock  AKI -likely prerenal, possible hypovolemia vs other hypotensive etiology above P  -trend renal indices -anticipate further Cr bump following CTA, will add low volume mIVF    COPD -home symbicort, albuterol, trelegy. Prescribed 2-3LNC, but is non-compliant as O2 is not at present residence  P -Supplemental O2 for  SpO2 goal 88-92%  -inpatient breo, pulmicort  DM P -SSI   Chronic Pain P -recommend home regimen as tolerated; would try to minimize PRN IV doses   Depression P - continuing home remeron and buproprion   Hypothyroidism P - home synthroid   Hx HTN P -tele monitoring -holding home regimen at present   Prior CVA -residual L sided weakness P -supportive care   Frequent falls P -PT/OT    Thank you for consulting PCCM. We  will sign off at this time with a plan for the patient to be admitted to SDU with Hospitalist team. Please re-engage PCCM if further assistance is required or if patient decompensates.  Best practice:  Diet: recommend heart healthy  Pain/Anxiety/Delirium protocol (if indicated): no acute regimen  VAP protocol (if indicated): na DVT prophylaxis: heparin gtt  GI prophylaxis: na Glucose control: SSI  Mobility: BR Code Status: Full  Family Communication: Patient expresses desire for no family communication  Disposition: Stable for admission to SDU with Hospitalist   Labs   CBC: Recent Labs  Lab 11/15/19 1455  WBC 14.6*  HGB 15.3*  HCT 48.3*  MCV 89.6  PLT 151    Basic Metabolic Panel: Recent Labs  Lab 11/15/19 1455  NA 134*  K 3.9  CL 96*  CO2 23  GLUCOSE 313*  BUN 18  CREATININE 1.47*  CALCIUM 9.1   GFR: Estimated Creatinine Clearance: 52.5 mL/min (A) (by C-G formula based on SCr of 1.47 mg/dL (H)). Recent Labs  Lab 11/15/19 1455 11/15/19 1542 11/15/19 1800  WBC 14.6*  --   --   LATICACIDVEN  --  2.7* 2.3*    Liver Function Tests: Recent Labs  Lab 11/15/19 1800  AST 34  ALT 18  ALKPHOS 115  BILITOT 0.5  PROT 6.6  ALBUMIN 3.4*   No results for input(s): LIPASE, AMYLASE in the last 168 hours. No results for input(s): AMMONIA in the last 168 hours.  ABG    Component Value Date/Time   TCO2 21 04/30/2010 0926     Coagulation Profile: Recent Labs  Lab 11/15/19 1800  INR 1.1    Cardiac Enzymes: No results for input(s): CKTOTAL, CKMB, CKMBINDEX, TROPONINI in the last 168 hours.  HbA1C: Hemoglobin A1C  Date/Time Value Ref Range Status  10/04/2019 10:22 AM 7.4 (A) 4.0 - 5.6 % Final  11/30/2012 04:28 AM 8.3 (H) 4.2 - 6.3 % Final    Comment:    The American Diabetes Association recommends that a primary goal of therapy should be <7% and that physicians should reevaluate the treatment regimen in patients with HbA1c values consistently >8%.     Hgb A1c MFr Bld  Date/Time Value Ref Range Status  04/22/2009 11:00 AM  4.6 - 6.1 % Final   6.1 (NOTE) The ADA recommends the following therapeutic goal for glycemic control related to Hgb A1c measurement: Goal of therapy: <6.5 Hgb A1c  Reference: American Diabetes Association: Clinical Practice Recommendations 2010, Diabetes Care, 2010, 33: (Suppl  1).  04/21/2009 04:50 PM (H) 4.6 - 6.1 % Final   6.3 (NOTE) The ADA recommends the following therapeutic goal for glycemic control related to Hgb A1c measurement: Goal of therapy: <6.5 Hgb A1c  Reference: American Diabetes Association: Clinical Practice Recommendations 2010, Diabetes Care, 2010, 33: (Suppl  1).    CBG: No results for input(s): GLUCAP in the last 168 hours.  Review of Systems:   Endorses SOB, difficulty breathing, chronic cough Endorses chest pain, denies palpitations. Endorses LLE edema  Endorses n/v. Denies diarrhea constipation abdominal pain Denies HA LOC dizziness blurry vision. Endorses chronic L sided weakness Denies weight loss fatigue sick contacts malaise Endorses back pain denies joint pain denies myalgias Denies rashes, wounds, changes to hair skin naikls Endorses urinary incontinence denies decresased UOP or painful urination   Past Medical History  She,  has a past medical history of Asthma, COPD (chronic obstructive pulmonary disease) ([place]), Diabetes mellitus without complication ([place]), Hypertension, and Stroke ([place]).   Surgical History   History reviewed. No pertinent surgical history.   Social History   reports that she has been smoking cigarettes. She has been smoking about 0.50 packs per day. She has never used smokeless tobacco. She reports that she does not drink alcohol or use drugs.   Family History   Her family history includes COPD in her sister; Diabetes in her mother; Heart failure in her sister.   Allergies Allergies  Allergen Reactions  . Guaifenesin Anaphylaxis  . Levaquin  [Levofloxacin In D5w] Shortness Of Breath and Rash  . Strawberry Extract Anaphylaxis  . Kiwi Extract Other (See Comments)    Other reaction(s): Other (See Comments)      Home Medications  Prior to Admission medications   Medication Sig Start Date End Date Taking? Authorizing Provider  acetaminophen (TYLENOL) 325 MG tablet Take 325 mg by mouth every 6 (six) hours as needed for pain. 07/23/19 08/23/20  [provider]  albuterol (VENTOLIN HFA) 108 (90 Base) MCG/ACT inhaler Inhale 2 puffs into the lungs every 6 (six) hours as needed for wheezing or shortness of breath. 10/04/19   Kerin Perna, NP  ALPRAZolam Duanne Moron) 0.5 MG tablet Take 0.5 mg by mouth every 8 (eight) hours as needed for anxiety.    [provider]  amLODipine-benazepril (LOTREL) 10-40 MG capsule Take 1 capsule by mouth daily. 10/04/19   Kerin Perna, NP  atorvastatin (LIPITOR) 80 MG tablet Take 1 tablet (80 mg total) by mouth daily. 10/04/19   Kerin Perna, NP  budesonide-formoterol (SYMBICORT) 160-4.5 MCG/ACT inhaler Inhale 2 puffs into the lungs 2 (two) times daily. 10/04/19   Kerin Perna, NP  buPROPion (WELLBUTRIN SR) 100 MG 12 hr tablet Take 1 tablet (100 mg total) by mouth 2 (two) times daily. 10/04/19   Kerin Perna, NP  clopidogrel (PLAVIX) 75 MG tablet Take 1 tablet (75 mg total) by mouth daily. 10/04/19   Kerin Perna, NP  cyclobenzaprine (FLEXERIL) 10 MG tablet Take 1 tablet (10 mg total) by mouth 3 (three) times daily as needed for muscle spasms. 10/04/19   Kerin Perna, NP  diclofenac (VOLTAREN) 50 MG EC tablet Take 1 tablet (50 mg total) by mouth every 8 (eight) hours as needed for mild pain. 09/09/19   Jacqlyn Larsen, PA-C  docusate sodium (COLACE) 100 MG capsule Take 100 mg by mouth 2 (two) times daily as needed for constipation. 08/23/19   [provider]  Fluticasone-Umeclidin-Vilant (TRELEGY ELLIPTA) 100-62.5-25 MCG/INH AEPB Inhale 1 puff  into the lungs daily. 10/04/19   Kerin Perna, NP  gabapentin (NEURONTIN) 300 MG capsule Take 1 capsule (300 mg total) by mouth 3 (three) times daily. 11/11/19 12/11/19  Kerin Perna, NP  HYDROcodone-acetaminophen (NORCO) 7.5-325 MG tablet Take 1 tablet by mouth every 6 (six) hours as needed for pain. 08/23/19   [provider]  ibuprofen (ADVIL) 800 MG tablet Take 1 tablet (800 mg total) by mouth every 8 (eight) hours as needed. 10/04/19  Kerin Perna, NP  Insulin Glargine (BASAGLAR KWIKPEN) 100 UNIT/ML SOPN Inject 0.15 mLs (15 Units total) into the skin daily. 10/04/19   Kerin Perna, NP  insulin lispro (HUMALOG KWIKPEN) 100 UNIT/ML KwikPen Inject 0.01-0.05 mLs (1-5 Units total) into the skin 3 (three) times daily as needed. PRN BLOOD SUGAR 151-200 1 unit 201-250 2 units 251-300 3 units 301-350 4 units 351-399 5 units Over 400 call MD 10/04/19   Charlott Rakes, MD  Insulin Pen Needle (PEN NEEDLES) 32G X 4 MM MISC 1 each by Does not apply route as needed. 10/04/19   Kerin Perna, NP  ipratropium-albuterol (DUONEB) 0.5-2.5 (3) MG/3ML SOLN Inhale 3 mLs into the lungs 4 (four) times daily as needed. 10/04/19   Kerin Perna, NP  levothyroxine (SYNTHROID) 50 MCG tablet Take 1 tablet (50 mcg total) by mouth daily. 10/07/19   Kerin Perna, NP  lidocaine (LIDODERM) 5 % Place 1 patch onto the skin every 12 (twelve) hours as needed. 09/09/19   Jacqlyn Larsen, PA-C  methylphenidate (RITALIN) 5 MG tablet Take 5 mg by mouth daily.     [provider]  metoprolol tartrate (LOPRESSOR) 25 MG tablet Take 1 tablet (25 mg total) by mouth 2 (two) times daily. 10/04/19   Kerin Perna, NP  mirtazapine (REMERON) 45 MG tablet Take 1 tablet (45 mg total) by mouth at bedtime. 10/04/19   Kerin Perna, NP  naloxone [facility]) 4 MG/0.1ML LIQD nasal spray kit Place 1 spray into the nose once as needed. overdose 06/03/19   [provider]  nicotine  (NICODERM CQ - DOSED IN MG/24 HOURS) 21 mg/24hr patch Place 21 mg onto the skin as needed. Smoking sensation 03/12/18   [provider]  NIFEdipine (PROCARDIA-XL/NIFEDICAL-XL) 30 MG 24 hr tablet Take 1 tablet (30 mg total) by mouth daily. 10/04/19   Kerin Perna, NP  polyethylene glycol (MIRALAX / GLYCOLAX) 17 g packet Take 17 g by mouth 2 (two) times daily as needed. 09/09/19   Jacqlyn Larsen, PA-C  tetrahydrozoline 0.05 % ophthalmic solution Place 1 drop into both eyes 4 (four) times daily as needed. Eye irritation 07/23/19   [provider]     Eliseo Gum MSN, AGACNP-BC [place] [phone] If no answer, [phone] 11/15/2019, 10:14 PM

## 2019-11-15 NOTE — ED Notes (Signed)
Several attempts by RN and EMT to collect second Blood culture is unsuccessful

## 2019-11-15 NOTE — ED Notes (Signed)
Pt request that family does not get information about this visit or any updates.

## 2019-11-15 NOTE — Progress Notes (Addendum)
Pharmacy Antibiotic Note  Karla Hoover is a 49 y.o. female admitted on 11/15/2019 with chest pain, now with suspected sepsis.  Pharmacy has been consulted for vancomycin and cefepime dosing.  WBC elevated at 14.6. Lactate elevated at 2.7. Patient is afebrile. Scr 1.47 up from baseline ~0.9-1.   Plan: - Vancomycin loading dose 1250 mg IV x1 - Vancomycin 1250 mg IV q24hr. Scr used 1.47. Est AUC 488. Goal 400-550.  - Cefepime 2g IV q12hr - Flagyl per MD - Monitor renal function, vancomycin levels at steady state, clinical improvement  - F/u length of therapy     Temp (24hrs), Avg:98.4 F (36.9 C), Min:98.4 F (36.9 C), Max:98.4 F (36.9 C)  Recent Labs  Lab 11/15/19 1455 11/15/19 1542  WBC 14.6*  --   CREATININE 1.47*  --   LATICACIDVEN  --  2.7*    Estimated Creatinine Clearance: 52.4 mL/min (A) (by C-G formula based on SCr of 1.47 mg/dL (H)).    Allergies  Allergen Reactions  . Guaifenesin Anaphylaxis  . Levaquin [Levofloxacin In D5w] Shortness Of Breath and Rash  . Strawberry Extract Anaphylaxis  . Kiwi Extract Other (See Comments)    Other reaction(s): Other (See Comments)     Antimicrobials this admission: Vancomycin 2/8 >>  Cefepime 2/8 >>  Flagyl 2/8 x1  Dose adjustments this admission:   Microbiology results: 2/8 BCx: sent 2/8 UCx: sent   Thank you for allowing pharmacy to be a part of this patient's care.  Ellison Carwin, PharmD PGY1 Pharmacy Resident

## 2019-11-15 NOTE — ED Triage Notes (Signed)
Pt arrives via EMS from doctors office. EMS reports the pt lives with sister but needs to find rehab facility. Sister told EMS that the pt is noncompliant at home and has behavorial issues. Pt reports CP and SOB. Reports frequent falls.

## 2019-11-15 NOTE — Progress Notes (Signed)
ANTICOAGULATION CONSULT NOTE   Pharmacy Consult for Heparin Indication: pulmonary embolus  Allergies  Allergen Reactions  . Guaifenesin Anaphylaxis  . Levaquin [Levofloxacin In D5w] Shortness Of Breath and Rash  . Strawberry Extract Anaphylaxis  . Kiwi Extract Other (See Comments)    Other reaction(s): Other (See Comments)     Patient Measurements: Height: 5' 6.5" (168.9 cm) Weight: 187 lb 6.3 oz (85 kg) IBW/kg (Calculated) : 60.45 Heparin Dosing Weight: 78.4kg  Vital Signs: Temp: 98.4 F (36.9 C) (02/08 1614) Temp Source: Oral (02/08 1614) BP: 111/92 (02/08 2130) Pulse Rate: 140 (02/08 2130)  Labs: Recent Labs    11/15/19 1455 11/15/19 1800  HGB 15.3*  --   HCT 48.3*  --   PLT 335  --   APTT  --  30  LABPROT  --  13.7  INR  --  1.1  CREATININE 1.47*  --   TROPONINIHS 27* 83*    Estimated Creatinine Clearance: 52.5 mL/min (A) (by C-G formula based on SCr of 1.47 mg/dL (H)).   Medications:  Scheduled:  . heparin  5,500 Units Intravenous Once  . sodium chloride flush  3 mL Intravenous Once    Assessment: Patient is a 4 yof that presents to the ED with complaints of chest pain. The patient was found to have a large PE with evidence of right heart strain (elevated trop). Pharmacy has been asked to dose heparin at this time.   Goal of Therapy:  Heparin level 0.3-0.7 units/ml Monitor platelets by anticoagulation protocol: Yes   Plan:  - Heparin bolus of 5500 units IV x 1 dose - Heparin 1400 units/hr  - Heparin level in 6 hrs  - Monitor patient for s/s of bleeding and cbc while on heparin.   Joaquim Lai PharmD. BCPS  11/15/2019,9:51 PM

## 2019-11-15 NOTE — H&P (Signed)
XOE HOE ZTI:[id number] DOB: 01-21-1971 DOA: 11/15/2019     PCP: Kerin Perna, NP   Outpatient Specialists:      Patient arrived to ER on 11/15/19 at 1433  Patient coming from: home Live  With family    Chief Complaint:   Chief Complaint  Patient presents with  . Chest Pain    HPI: Karla Hoover is a 49 y.o. female with medical history significant of COPD, DM2 , hx of CVA with left-sided weakness, HTN    Presented with continuous chest pain and now some shortness of breath.  Have   had frequent falls and recurrent chest pain, reports chest pain is somewhat closer to her epigastrium now feels like pressure worse with inspiration Per her family she is noncompliant her home medications. Family was interested in patient being placed Patient reports an hour fall today.  She does not use a walker at home.  She has been using other provable or wall to transfer and pivot. Patient states her family promised to move her into a nursing home in [place] as she was in a nursing home in [place] but when she arrived to [place] she realized she was to stay at her sister's house. Patient states she knows it is very difficult for them to take care of her as she is needs significant assistance with ambulation. Denies LOC or head injury Patient states she is supposed to be on oxygen but has not been able to be compliant as she left her oxygen and some other apartment. Patient endorsing sometimes not taking her medications and eating unhealthy as well as drinking soda  Patient had a fall last week out of her wheelchair and since then have had some less chest pain when she fell down on her left side she was seen for this in the emergency department on 03 November 2019 She used to live in [place] but moved here after having a stroke.  She continues to smoke have tried chewing gum Does not drink much  Reports feeling chilly bc her house is cold But no fever Infectious risk  factors:  Reports shortness of breath,   chest pain,     In  ER  COVID TEST  NEGATIVE     Lab Results  Component Value Date   [place] NEGATIVE 11/15/2019    Regarding pertinent Chronic problems:     Hyperlipidemia - on statins Lipitor   HTN on metoprolol Procardia amlodipine and benazepril    DM 2 -  Lab Results  Component Value Date   HGBA1C 7.4 (A) 10/04/2019    on insulin,     Hypothyroidism:  Lab Results  Component Value Date   TSH 6.780 (H) 10/04/2019   on synthroid    COPD - not followed by pulmonology  Was supposed on baseline oxygen  3.5 with exertion and 2L at rest, she has been off oxygen since she had her stroke in August     Hx of CVA -  With residual deficits left side weakness on  Plavix      While in ER: Found to be tachycardic F dense of AKI with creatinine up to 1.47 WBC of 14.6 Given tachycardia in the 140s elevated lactic acid 2.7 initially felt to be septic start IV fluids and IV antibiotics vancomycin cefepime and Flagyl was started  Covid and flu was negative UA worrisome for UTI  Slightly elevated troponin 27- 83  leg edema Given persistent tachycardia elevated troponin  ER Provider Called: Cardiology    Dr. Aundra Dubin  They Recommend admit to medicine     CTA showed bilateral PE and right heart strain PCCM has been consulted Dr. Lamont Snowball Patient was started on heparin  The following Work up has been ordered so far:  Orders Placed This Encounter  Procedures  . Blood culture (routine x 2)  . Respiratory Panel by RT PCR (Flu A&B, Covid) - Nasopharyngeal Swab  . Urine culture  . DG Chest 2 View  . CT Angio Chest PE W and/or Wo Contrast  . Basic metabolic panel  . CBC  . Urinalysis, Routine w reflex microscopic  . Lactic acid, plasma  . D-dimer, quantitative (not at [facility])  . APTT  . Protime-INR  . Hepatic function panel  . Heparin level (unfractionated)  . Heparin level (unfractionated)  . CBC  . Brain natriuretic peptide  .  Diet NPO time specified  . Cardiac monitoring  . Saline Lock IV, Maintain IV access  . Refer to Sidebar Report: Sepsis Sidebar ED/IP  . Document vital signs within 1-hour of fluid bolus completion and notify provider of bolus completion  . Document height and weight  . Insert peripheral IV x 2  . Initiate Carrier Fluid Protocol  . In and Out Cath  . STAT CBG when hypoglycemia is suspected. If treated, recheck every 15 minutes after each treatment until CBG >/= 70 mg/dl  . Refer to Hypoglycemia Protocol Sidebar Report for treatment of CBG < 70 mg/dl  . No basal insulin at this time  . Initiate Code Sepsis ([facility] [phone])  When activated, this will prioritize pharmacy, lab, and radiology services for this patient for STAT collections and interventions.  . Initiate Code Sepsis ([facility]link [phone])  When activated, this will prioritize pharmacy, lab, and radiology services for this patient for STAT collections and interventions.  . pharmacy consult  . Inpatient consult to Cardiology  ALL PATIENTS BEING ADMITTED/HAVING PROCEDURES NEED COVID-19 SCREENING  . heparin per pharmacy consult  . Call PERT  . Consult to hospitalist  ALL PATIENTS BEING ADMITTED/HAVING PROCEDURES NEED COVID-19 SCREENING  . Pulse oximetry, continuous  . I-Stat beta hCG blood, ED  . EKG 12-Lead  . ED EKG  . ECHOCARDIOGRAM COMPLETE    Following Medications were ordered in ER: Medications  sodium chloride flush (NS) 0.9 % injection 3 mL (has no administration in time range)  ceFEPIme (MAXIPIME) 2 g in sodium chloride 0.9 % 100 mL IVPB (has no administration in time range)  heparin ADULT infusion 100 units/mL (25000 units/259m sodium chloride 0.45%) (1,400 Units/hr Intravenous New Bag/Given 11/15/19 2156)  budesonide (PULMICORT) nebulizer solution 0.25 mg (has no administration in time range)  fluticasone furoate-vilanterol (BREO ELLIPTA) 100-25 MCG/INH 1 puff (has no administration in time range)  insulin  aspart (novoLOG) injection 0-15 Units (has no administration in time range)  levothyroxine (SYNTHROID) tablet 50 mcg (has no administration in time range)  mirtazapine (REMERON) tablet 45 mg (has no administration in time range)  cyclobenzaprine (FLEXERIL) tablet 10 mg (has no administration in time range)  HYDROcodone-acetaminophen (NORCO) 7.5-325 MG per tablet 1 tablet (has no administration in time range)  buPROPion (WELLBUTRIN SR) 12 hr tablet 100 mg (has no administration in time range)  sodium chloride 0.9 % bolus 1,000 mL (0 mLs Intravenous Stopped 11/15/19 1830)  morphine 4 MG/ML injection 4 mg (4 mg Intravenous Given 11/15/19 1714)  sodium chloride 0.9 % bolus 1,000 mL (0 mLs Intravenous Stopped 11/15/19 1946)  ceFEPIme (MAXIPIME)  2 g in sodium chloride 0.9 % 100 mL IVPB (0 g Intravenous Stopped 11/15/19 1910)  metroNIDAZOLE (FLAGYL) IVPB 500 mg (0 mg Intravenous Stopped 11/15/19 1950)  iohexol (OMNIPAQUE) 350 MG/ML injection 100 mL (100 mLs Intravenous Contrast Given 11/15/19 2127)  heparin bolus via infusion 5,500 Units (5,500 Units Intravenous Bolus from Bag 11/15/19 2156)  morphine 4 MG/ML injection 4 mg (4 mg Intravenous Given 11/15/19 2216)        Consult Orders  (From admission, onward)         Start     Ordered   11/15/19 2244  Consult to hospitalist  ALL PATIENTS BEING ADMITTED/HAVING PROCEDURES NEED COVID-19 SCREENING PAGED [facility]  Once    Comments: ALL PATIENTS BEING ADMITTED/HAVING PROCEDURES NEED COVID-19 SCREENING  Provider:  (Not yet assigned)  Question Answer Comment  Place call to: [facility]   Reason for Consult Admit      11/15/19 2243          Significant initial  Findings: Abnormal Labs Reviewed  BASIC METABOLIC PANEL - Abnormal; Notable for the following components:      Result Value   Sodium 134 (*)    Chloride 96 (*)    Glucose, Bld 313 (*)    Creatinine, Ser 1.47 (*)    GFR calc non Af Amer 42 (*)    GFR calc Af Amer 49 (*)    All other  components within normal limits  CBC - Abnormal; Notable for the following components:   WBC 14.6 (*)    RBC 5.39 (*)    Hemoglobin 15.3 (*)    HCT 48.3 (*)    All other components within normal limits  URINALYSIS, ROUTINE W REFLEX MICROSCOPIC - Abnormal; Notable for the following components:   APPearance HAZY (*)    Glucose, UA 50 (*)    Protein, ur 100 (*)    Leukocytes,Ua SMALL (*)    Bacteria, UA MANY (*)    All other components within normal limits  LACTIC ACID, PLASMA - Abnormal; Notable for the following components:   Lactic Acid, Venous 2.7 (*)    All other components within normal limits  LACTIC ACID, PLASMA - Abnormal; Notable for the following components:   Lactic Acid, Venous 2.3 (*)    All other components within normal limits  D-DIMER, QUANTITATIVE (NOT AT [facility]) - Abnormal; Notable for the following components:   D-Dimer, Quant 1.90 (*)    All other components within normal limits  HEPATIC FUNCTION PANEL - Abnormal; Notable for the following components:   Albumin 3.4 (*)    All other components within normal limits  TROPONIN I (HIGH SENSITIVITY) - Abnormal; Notable for the following components:   Troponin I (High Sensitivity) 27 (*)    All other components within normal limits  TROPONIN I (HIGH SENSITIVITY) - Abnormal; Notable for the following components:   Troponin I (High Sensitivity) 83 (*)    All other components within normal limits    Otherwise labs showing:    Recent Labs  Lab 11/15/19 1455  NA 134*  K 3.9  CO2 23  GLUCOSE 313*  BUN 18  CREATININE 1.47*  CALCIUM 9.1    Cr    Up from baseline see below Lab Results  Component Value Date   CREATININE 1.47 (H) 11/15/2019   CREATININE 1.10 (H) 09/09/2019   CREATININE 0.92 08/25/2019    Recent Labs  Lab 11/15/19 1800  AST 34  ALT 18  ALKPHOS 115  BILITOT 0.5  PROT 6.6  ALBUMIN 3.4*   Lab Results  Component Value Date   CALCIUM 9.1 11/15/2019      WBC      Component Value  Date/Time   WBC 14.6 (H) 11/15/2019 1455   ANC    Component Value Date/Time   NEUTROABS 6.5 09/09/2019 1106   NEUTROABS 11.5 (H) 11/30/2012 0428   ALC No components found for: LYMPHAB    Plt: Lab Results  Component Value Date   PLT 335 11/15/2019    Lactic Acid, Venous    Component Value Date/Time   LATICACIDVEN 2.3 (HH) 11/15/2019 1800     COVID-19 Labs  Recent Labs    11/15/19 2104  DDIMER 1.90*    Lab Results  Component Value Date   SARSCOV2NAA NEGATIVE 11/15/2019     HG/HCT stable,     Component Value Date/Time   HGB 15.3 (H) 11/15/2019 1455   HGB 13.8 12/10/2012 0626   HCT 48.3 (H) 11/15/2019 1455   HCT 40.8 12/10/2012 0626    Troponin 27-83- 259 Cardiac Panel (last 3 results) No results for input(s): CKTOTAL, CKMB, TROPONINI, RELINDX in the last 72 hours.     ECG: Ordered Personally reviewed by me showing: HR : 149 Rhythm:  Sinus tachycardia    nonspecific changes   QTC 538   BNP (last 3 results) Recent Labs    11/15/19 2218  BNP 218.7*     DM  labs:  HbA1C: Recent Labs    10/04/19 1022  HGBA1C 7.4*       CBG (last 3)  No results for input(s): GLUCAP in the last 72 hours.     UA bacteruria   Urine analysis:    Component Value Date/Time   COLORURINE YELLOW 11/15/2019 2139   APPEARANCEUR HAZY (A) 11/15/2019 2139   APPEARANCEUR Clear 11/24/2012 1238   LABSPEC 1.025 11/15/2019 2139   LABSPEC 1.000 11/24/2012 1238   PHURINE 6.0 11/15/2019 2139   GLUCOSEU 50 (A) 11/15/2019 2139   GLUCOSEU >=500 11/24/2012 1238   HGBUR NEGATIVE 11/15/2019 2139   BILIRUBINUR NEGATIVE 11/15/2019 2139   BILIRUBINUR Negative 11/24/2012 [place] 11/15/2019 2139   PROTEINUR 100 (A) 11/15/2019 2139   UROBILINOGEN 1.0 04/30/2010 0902   NITRITE NEGATIVE 11/15/2019 2139   LEUKOCYTESUR SMALL (A) 11/15/2019 2139   LEUKOCYTESUR Negative 11/24/2012 1238      Ordered    CXR -   NON acute   CTA chest -   PE, with right heart  strain     ED Triage Vitals  Enc Vitals Group     BP 11/15/19 1434 90/72     Pulse Rate 11/15/19 1434 (!) 151     Resp 11/15/19 1434 18     Temp 11/15/19 1614 98.4 F (36.9 C)     Temp Source 11/15/19 1434 Oral     SpO2 11/15/19 1434 99 %     Weight 11/15/19 1832 187 lb 6.3 oz (85 kg)     Height 11/15/19 1832 5' 6.5" (1.689 m)     Head Circumference --      Peak Flow --      Pain Score 11/15/19 1444 8     Pain Loc --      Pain Edu? --      Excl. in [place]? --   TMAX(24)@       Latest  Blood pressure (!) 125/104, pulse (!) 137, temperature 98.4 F (36.9 C), temperature source Oral, resp. rate (!) 30, height  5' 6.5" (1.689 m), weight 85 kg, last menstrual period 10/20/2019, SpO2 97 %.     Hospitalist was called for admission for PE   Review of Systems:    Pertinent positives include:  Fatigue, left side weakness vomiting, chest pain,  Constitutional:  No weight loss, night sweats, Fevers, chills weight loss  HEENT:  No headaches, Difficulty swallowing,Tooth/dental problems,Sore throat,  No sneezing, itching, ear ache, nasal congestion, post nasal drip,  Cardio-vascular:  No Orthopnea, PND, anasarca, dizziness, palpitations.no Bilateral lower extremity swelling  GI:  No heartburn, indigestion, abdominal pain, nausea,  diarrhea, change in bowel habits, loss of appetite, melena, blood in stool, hematemesis Resp:  no shortness of breath at rest. No dyspnea on exertion, No excess mucus, no productive cough, No non-productive cough, No coughing up of blood.No change in color of mucus.No wheezing. Skin:  no rash or lesions. No jaundice GU:  no dysuria, change in color of urine, no urgency or frequency. No straining to urinate.  No flank pain.  Musculoskeletal:  No joint pain or no joint swelling. No decreased range of motion. No back pain.  Psych:  No change in mood or affect. No depression or anxiety. No memory loss.  Neuro: no localizing neurological complaints, no tingling,  no weakness, no double vision, no gait abnormality, no slurred speech, no confusion  All systems reviewed and apart from [place] all are negative  Past Medical History:   Past Medical History:  Diagnosis Date  . Asthma   . COPD (chronic obstructive pulmonary disease) ([place])   . Diabetes mellitus without complication ([place])   . Hypertension   . Stroke [facility])       History reviewed. No pertinent surgical history.  Social History:  Ambulatory holds on to the walls     reports that she has been smoking cigarettes. She has been smoking about 0.50 packs per day. She has never used smokeless tobacco. She reports that she does not drink alcohol or use drugs.   Family History:   Family History  Problem Relation Age of Onset  . Diabetes Mother   . COPD Sister   . Heart failure Sister     Allergies: Allergies  Allergen Reactions  . Guaifenesin Anaphylaxis  . Levaquin [Levofloxacin In D5w] Shortness Of Breath and Rash  . Strawberry Extract Anaphylaxis  . Kiwi Extract Other (See Comments)    Other reaction(s): Other (See Comments)      Prior to Admission medications   Medication Sig Start Date End Date Taking? Authorizing Provider  acetaminophen (TYLENOL) 325 MG tablet Take 325 mg by mouth every 6 (six) hours as needed for pain. 07/23/19 08/23/20  [provider]  albuterol (VENTOLIN HFA) 108 (90 Base) MCG/ACT inhaler Inhale 2 puffs into the lungs every 6 (six) hours as needed for wheezing or shortness of breath. 10/04/19   Kerin Perna, NP  ALPRAZolam Duanne Moron) 0.5 MG tablet Take 0.5 mg by mouth every 8 (eight) hours as needed for anxiety.    [provider]  amLODipine-benazepril (LOTREL) 10-40 MG capsule Take 1 capsule by mouth daily. 10/04/19   Kerin Perna, NP  atorvastatin (LIPITOR) 80 MG tablet Take 1 tablet (80 mg total) by mouth daily. 10/04/19   Kerin Perna, NP  budesonide-formoterol (SYMBICORT) 160-4.5 MCG/ACT inhaler Inhale 2 puffs  into the lungs 2 (two) times daily. 10/04/19   Kerin Perna, NP  buPROPion (WELLBUTRIN SR) 100 MG 12 hr tablet Take 1 tablet (100 mg total) by mouth 2 (  two) times daily. 10/04/19   Kerin Perna, NP  clopidogrel (PLAVIX) 75 MG tablet Take 1 tablet (75 mg total) by mouth daily. 10/04/19   Kerin Perna, NP  cyclobenzaprine (FLEXERIL) 10 MG tablet Take 1 tablet (10 mg total) by mouth 3 (three) times daily as needed for muscle spasms. 10/04/19   Kerin Perna, NP  diclofenac (VOLTAREN) 50 MG EC tablet Take 1 tablet (50 mg total) by mouth every 8 (eight) hours as needed for mild pain. 09/09/19   Jacqlyn Larsen, PA-C  docusate sodium (COLACE) 100 MG capsule Take 100 mg by mouth 2 (two) times daily as needed for constipation. 08/23/19   [provider]  Fluticasone-Umeclidin-Vilant (TRELEGY ELLIPTA) 100-62.5-25 MCG/INH AEPB Inhale 1 puff into the lungs daily. 10/04/19   Kerin Perna, NP  gabapentin (NEURONTIN) 300 MG capsule Take 1 capsule (300 mg total) by mouth 3 (three) times daily. 11/11/19 12/11/19  Kerin Perna, NP  HYDROcodone-acetaminophen (NORCO) 7.5-325 MG tablet Take 1 tablet by mouth every 6 (six) hours as needed for pain. 08/23/19   [provider]  ibuprofen (ADVIL) 800 MG tablet Take 1 tablet (800 mg total) by mouth every 8 (eight) hours as needed. 10/04/19   Kerin Perna, NP  Insulin Glargine (BASAGLAR KWIKPEN) 100 UNIT/ML SOPN Inject 0.15 mLs (15 Units total) into the skin daily. 10/04/19   Kerin Perna, NP  insulin lispro (HUMALOG KWIKPEN) 100 UNIT/ML KwikPen Inject 0.01-0.05 mLs (1-5 Units total) into the skin 3 (three) times daily as needed. PRN BLOOD SUGAR 151-200 1 unit 201-250 2 units 251-300 3 units 301-350 4 units 351-399 5 units Over 400 call MD 10/04/19   Charlott Rakes, MD  Insulin Pen Needle (PEN NEEDLES) 32G X 4 MM MISC 1 each by Does not apply route as needed. 10/04/19   Kerin Perna, NP   ipratropium-albuterol (DUONEB) 0.5-2.5 (3) MG/3ML SOLN Inhale 3 mLs into the lungs 4 (four) times daily as needed. 10/04/19   Kerin Perna, NP  levothyroxine (SYNTHROID) 50 MCG tablet Take 1 tablet (50 mcg total) by mouth daily. 10/07/19   Kerin Perna, NP  lidocaine (LIDODERM) 5 % Place 1 patch onto the skin every 12 (twelve) hours as needed. 09/09/19   Jacqlyn Larsen, PA-C  methylphenidate (RITALIN) 5 MG tablet Take 5 mg by mouth daily.     [provider]  metoprolol tartrate (LOPRESSOR) 25 MG tablet Take 1 tablet (25 mg total) by mouth 2 (two) times daily. 10/04/19   Kerin Perna, NP  mirtazapine (REMERON) 45 MG tablet Take 1 tablet (45 mg total) by mouth at bedtime. 10/04/19   Kerin Perna, NP  naloxone [facility]) 4 MG/0.1ML LIQD nasal spray kit Place 1 spray into the nose once as needed. overdose 06/03/19   [provider]  nicotine (NICODERM CQ - DOSED IN MG/24 HOURS) 21 mg/24hr patch Place 21 mg onto the skin as needed. Smoking sensation 03/12/18   [provider]  NIFEdipine (PROCARDIA-XL/NIFEDICAL-XL) 30 MG 24 hr tablet Take 1 tablet (30 mg total) by mouth daily. 10/04/19   Kerin Perna, NP  polyethylene glycol (MIRALAX / GLYCOLAX) 17 g packet Take 17 g by mouth 2 (two) times daily as needed. 09/09/19   Jacqlyn Larsen, PA-C  tetrahydrozoline 0.05 % ophthalmic solution Place 1 drop into both eyes 4 (four) times daily as needed. Eye irritation 07/23/19   [provider]   Physical Exam: Blood pressure (!) 125/104, pulse Marland Kitchen)  137, temperature 98.4 F (36.9 C), temperature source Oral, resp. rate (!) 30, height 5' 6.5" (1.689 m), weight 85 kg, last menstrual period 10/20/2019, SpO2 97 %. 1. General:  in No  Acute distress   Chronically ill -appearing 2. Psychological: Alert and   Oriented 3. Head/ENT:   Moist   Mucous Membranes                          Head Non traumatic, neck supple                           Poor  Dentition 4. SKIN:   decreased Skin turgor,  Skin clean Dry and intact no rash 5. Heart: Regular rate and rhythm no   Murmur, no Rub or gallop 6. Lungs:  , no wheezes or crackles   7. Abdomen: Soft, non-tender, Non distended obese  bowel sounds present 8. Lower extremities: no clubbing, cyanosis,  Edema L>R 9. Neurologically Left side weakness 10. MSK: Normal range of motion   All other LABS:     Recent Labs  Lab 11/15/19 1455  WBC 14.6*  HGB 15.3*  HCT 48.3*  MCV 89.6  PLT 335     Recent Labs  Lab 11/15/19 1455  NA 134*  K 3.9  CL 96*  CO2 23  GLUCOSE 313*  BUN 18  CREATININE 1.47*  CALCIUM 9.1     Recent Labs  Lab 11/15/19 1800  AST 34  ALT 18  ALKPHOS 115  BILITOT 0.5  PROT 6.6  ALBUMIN 3.4*      Cultures:    Component Value Date/Time   SDES  09/09/2019 1445    URINE, CLEAN CATCH Performed at [facility], [place] [address] [place], [place] [address]    SPECREQUEST  09/09/2019 1445    NONE Performed at [facility], [place] [address] [place], [place] [address]    CULT MULTIPLE SPECIES PRESENT, SUGGEST RECOLLECTION (A) 09/09/2019 1445   REPTSTATUS 09/10/2019 FINAL 09/09/2019 1445     Radiological Exams on Admission: DG Chest 2 View  Result Date: 11/15/2019 CLINICAL DATA:  Chest pain and shortness of breath EXAM: CHEST - 2 VIEW COMPARISON:  November 03, 2019 FINDINGS: There is mild left base atelectasis. The lungs elsewhere are clear. Heart size and pulmonary vascular normal. No adenopathy. No pneumothorax. There is mild degenerative change in the thoracic spine. IMPRESSION: Mild left base atelectasis. Lungs elsewhere clear. Cardiac silhouette within normal limits. Electronically Signed   By: Lowella Grip III M.D.   On: 11/15/2019 15:15   CT Angio Chest PE W and/or Wo Contrast  Result Date: 11/15/2019 CLINICAL DATA:  Shortness of breath, chest pain EXAM: CT ANGIOGRAPHY CHEST WITH CONTRAST TECHNIQUE: Multidetector  CT imaging of the chest was performed using the standard protocol during bolus administration of intravenous contrast. Multiplanar CT image reconstructions and MIPs were obtained to evaluate the vascular anatomy. CONTRAST:  136m OMNIPAQUE IOHEXOL 350 MG/ML SOLN COMPARISON:  06/21/2012 FINDINGS: Cardiovascular: Bilateral moderate to large volume pulmonary emboli noted involving arterial branches in all lobes of both lungs. Evidence of right heart strain. RV/LV ratio 2.0. Heart is normal size. Aorta normal caliber. Mediastinum/Nodes: No mediastinal, hilar, or axillary adenopathy. Trachea and esophagus are unremarkable. Thyroid unremarkable. Lungs/Pleura: Left base atelectasis.  No effusions. Upper Abdomen: Imaging into the upper abdomen shows no acute findings. Musculoskeletal: Chest wall soft tissues are unremarkable. No acute bony  abnormality. Review of the MIP images confirms the above findings. IMPRESSION: Positive for acute PE with CT evidence of right heart strain (RV/LV Ratio = 2.0) consistent with at least submassive (intermediate risk) PE. The presence of right heart strain has been associated with an increased risk of morbidity and mortality. Please activate Code PE by paging [phone]. Critical Value/emergent results were called by telephone at the time of interpretation on 11/15/2019 at 9:31 pm to provider [facility] , who verbally acknowledged these results. Electronically Signed   By: Rolm Baptise M.D.   On: 11/15/2019 21:32    Chart has been reviewed    Assessment/Plan   49 y.o. female with medical history significant of COPD, DM2 , hx of CVA with left-sided weakness, HTN     Admitted for PE   with evidence of right side strain elevated troponin and BNP  Present on Admission: . Pulmonary embolism ([place]) -  Admit to  stepdown Initiate heparin drip  Would likely benefit from case manager consult for long term anticoagulation Hold home blood pressure medications avoid hypotension Cycle  cardiac enzymes trop continues to rise Suggestive of worrisome prognosis [id number]  Order echogram and lower extremity Dopplers  Most likely risk factors for hypercoagulable state being obesity   sedentary lifestyle     [facility] consulted and code PE activated, per PCCM patient not a candidate for tPA  . Essential hypertension chronic of permissive hypertension given PE  . COPD with chronic bronchitis ([place]) chronic continue home medications will need to assess for home oxygen needs prior to discharge  . AKI (acute kidney injury) ([place]) obtain urine electrolytes and gently rehydrate and follow, expect worsening in setting recent contrast bolus   hx of CVA -continue Plavix and statin with residual left-sided weakness which is persistent patient most likely would benefit from SNF placement  Dm 2  -  - Order Sensitive  SSI   - continue home insulin regimen   -  check TSH and HgA1C  - Hold by mouth medications    Elevated troponin -in the setting of pulmonary embolism on heparin. Echogram in the morning. Overall indicative of poor prognostic factor in the setting of PE Somewhat elevated BNP as well worrisome  Tobacco abuse -spoke about importance of quitting  continue nicotine patch  nursing tobacco cessation  Other plan as per orders.  DVT prophylaxis:  heparin  Code Status:  FULL CODE  as per patient   I had personally discussed CODE STATUS with patient   Family Communication:   Family not at  Bedside   Disposition Plan:   likely will need placement for rehabilitation                                               Would benefit from PT/OT eval prior to DC  Ordered                                       Social Work  consulted                                       Consults called: Cardiology was initially consulted, PCCM was initially consulted  Admission status:  ED Disposition    None     Obs     Level of care       SDU tele indefinitely please discontinue once patient  no longer qualifies   Precautions: admitted as  Covid Negative  No active isolations    PPE: Used by the provider:   P100  eye Goggles,  Gloves    Belmont Valli 11/16/2019, 12:31 AM    [facility]     after 2 AM please page floor coverage PA If 7AM-7PM, please contact the day team taking care of the patient using [email or web address]   Patient was evaluated in the context of the global COVID-19 pandemic, which necessitated consideration that the patient might be at risk for infection with the SARS-CoV-2 virus that causes COVID-19. Institutional protocols and algorithms that pertain to the evaluation of patients at risk for COVID-19 are in a state of rapid change based on information released by regulatory bodies including the CDC and federal and state organizations. These policies and algorithms were followed during the patient's care.

## 2019-11-16 ENCOUNTER — Inpatient Hospital Stay (HOSPITAL_COMMUNITY): Payer: Medicaid Other

## 2019-11-16 ENCOUNTER — Encounter (HOSPITAL_COMMUNITY): Payer: Self-pay | Admitting: Internal Medicine

## 2019-11-16 DIAGNOSIS — K5909 Other constipation: Secondary | ICD-10-CM | POA: Diagnosis present

## 2019-11-16 DIAGNOSIS — F17210 Nicotine dependence, cigarettes, uncomplicated: Secondary | ICD-10-CM | POA: Diagnosis present

## 2019-11-16 DIAGNOSIS — J9601 Acute respiratory failure with hypoxia: Secondary | ICD-10-CM | POA: Diagnosis not present

## 2019-11-16 DIAGNOSIS — N179 Acute kidney failure, unspecified: Secondary | ICD-10-CM | POA: Diagnosis present

## 2019-11-16 DIAGNOSIS — R7989 Other specified abnormal findings of blood chemistry: Secondary | ICD-10-CM | POA: Diagnosis present

## 2019-11-16 DIAGNOSIS — E1165 Type 2 diabetes mellitus with hyperglycemia: Secondary | ICD-10-CM | POA: Diagnosis present

## 2019-11-16 DIAGNOSIS — Z720 Tobacco use: Secondary | ICD-10-CM | POA: Diagnosis present

## 2019-11-16 DIAGNOSIS — R609 Edema, unspecified: Secondary | ICD-10-CM

## 2019-11-16 DIAGNOSIS — F329 Major depressive disorder, single episode, unspecified: Secondary | ICD-10-CM | POA: Diagnosis present

## 2019-11-16 DIAGNOSIS — Z20822 Contact with and (suspected) exposure to covid-19: Secondary | ICD-10-CM | POA: Diagnosis present

## 2019-11-16 DIAGNOSIS — Z794 Long term (current) use of insulin: Secondary | ICD-10-CM | POA: Diagnosis not present

## 2019-11-16 DIAGNOSIS — I69354 Hemiplegia and hemiparesis following cerebral infarction affecting left non-dominant side: Secondary | ICD-10-CM | POA: Diagnosis not present

## 2019-11-16 DIAGNOSIS — G8929 Other chronic pain: Secondary | ICD-10-CM | POA: Diagnosis present

## 2019-11-16 DIAGNOSIS — I2602 Saddle embolus of pulmonary artery with acute cor pulmonale: Secondary | ICD-10-CM

## 2019-11-16 DIAGNOSIS — E872 Acidosis: Secondary | ICD-10-CM | POA: Diagnosis present

## 2019-11-16 DIAGNOSIS — R42 Dizziness and giddiness: Secondary | ICD-10-CM | POA: Diagnosis present

## 2019-11-16 DIAGNOSIS — Z7902 Long term (current) use of antithrombotics/antiplatelets: Secondary | ICD-10-CM | POA: Diagnosis not present

## 2019-11-16 DIAGNOSIS — E039 Hypothyroidism, unspecified: Secondary | ICD-10-CM | POA: Diagnosis present

## 2019-11-16 DIAGNOSIS — K811 Chronic cholecystitis: Secondary | ICD-10-CM | POA: Diagnosis present

## 2019-11-16 DIAGNOSIS — I2699 Other pulmonary embolism without acute cor pulmonale: Secondary | ICD-10-CM

## 2019-11-16 DIAGNOSIS — R778 Other specified abnormalities of plasma proteins: Secondary | ICD-10-CM | POA: Diagnosis present

## 2019-11-16 DIAGNOSIS — R000 Tachycardia, unspecified: Secondary | ICD-10-CM | POA: Diagnosis present

## 2019-11-16 DIAGNOSIS — I82412 Acute embolism and thrombosis of left femoral vein: Secondary | ICD-10-CM | POA: Diagnosis present

## 2019-11-16 DIAGNOSIS — J449 Chronic obstructive pulmonary disease, unspecified: Secondary | ICD-10-CM | POA: Diagnosis present

## 2019-11-16 DIAGNOSIS — E663 Overweight: Secondary | ICD-10-CM | POA: Diagnosis present

## 2019-11-16 DIAGNOSIS — I10 Essential (primary) hypertension: Secondary | ICD-10-CM | POA: Diagnosis present

## 2019-11-16 DIAGNOSIS — R269 Unspecified abnormalities of gait and mobility: Secondary | ICD-10-CM | POA: Diagnosis present

## 2019-11-16 DIAGNOSIS — R432 Parageusia: Secondary | ICD-10-CM | POA: Diagnosis present

## 2019-11-16 DIAGNOSIS — J9621 Acute and chronic respiratory failure with hypoxia: Secondary | ICD-10-CM | POA: Diagnosis present

## 2019-11-16 DIAGNOSIS — N390 Urinary tract infection, site not specified: Secondary | ICD-10-CM | POA: Diagnosis present

## 2019-11-16 HISTORY — DX: Other pulmonary embolism without acute cor pulmonale: I26.99

## 2019-11-16 LAB — CBC WITH DIFFERENTIAL/PLATELET
Abs Immature Granulocytes: 0.07 10*3/uL (ref 0.00–0.07)
Basophils Absolute: 0.1 10*3/uL (ref 0.0–0.1)
Basophils Relative: 0 %
Eosinophils Absolute: 0 10*3/uL (ref 0.0–0.5)
Eosinophils Relative: 0 %
HCT: 43 % (ref 36.0–46.0)
Hemoglobin: 14.4 g/dL (ref 12.0–15.0)
Immature Granulocytes: 0 %
Lymphocytes Relative: 27 %
Lymphs Abs: 4.8 10*3/uL — ABNORMAL HIGH (ref 0.7–4.0)
MCH: 28.1 pg (ref 26.0–34.0)
MCHC: 33.5 g/dL (ref 30.0–36.0)
MCV: 84 fL (ref 80.0–100.0)
Monocytes Absolute: 1.4 10*3/uL — ABNORMAL HIGH (ref 0.1–1.0)
Monocytes Relative: 8 %
Neutro Abs: 11.5 10*3/uL — ABNORMAL HIGH (ref 1.7–7.7)
Neutrophils Relative %: 65 %
Platelets: 257 10*3/uL (ref 150–400)
RBC: 5.12 MIL/uL — ABNORMAL HIGH (ref 3.87–5.11)
RDW: 12.4 % (ref 11.5–15.5)
WBC: 17.9 10*3/uL — ABNORMAL HIGH (ref 4.0–10.5)
nRBC: 0 % (ref 0.0–0.2)

## 2019-11-16 LAB — COMPREHENSIVE METABOLIC PANEL
ALT: 45 U/L — ABNORMAL HIGH (ref 0–44)
AST: 67 U/L — ABNORMAL HIGH (ref 15–41)
Albumin: 3.6 g/dL (ref 3.5–5.0)
Alkaline Phosphatase: 122 U/L (ref 38–126)
Anion gap: 11 (ref 5–15)
BUN: 21 mg/dL — ABNORMAL HIGH (ref 6–20)
CO2: 23 mmol/L (ref 22–32)
Calcium: 8.9 mg/dL (ref 8.9–10.3)
Chloride: 100 mmol/L (ref 98–111)
Creatinine, Ser: 1.52 mg/dL — ABNORMAL HIGH (ref 0.44–1.00)
GFR calc Af Amer: 47 mL/min — ABNORMAL LOW (ref >60)
GFR calc non Af Amer: 40 mL/min — ABNORMAL LOW (ref >60)
Glucose, Bld: 257 mg/dL — ABNORMAL HIGH (ref 70–99)
Potassium: 5.5 mmol/L — ABNORMAL HIGH (ref 3.5–5.1)
Sodium: 134 mmol/L — ABNORMAL LOW (ref 135–145)
Total Bilirubin: 0.8 mg/dL (ref 0.3–1.2)
Total Protein: 7.1 g/dL (ref 6.5–8.1)

## 2019-11-16 LAB — HEPARIN LEVEL (UNFRACTIONATED)
Heparin Unfractionated: 0.83 IU/mL — ABNORMAL HIGH (ref 0.30–0.70)
Heparin Unfractionated: 0.49 IU/mL (ref 0.30–0.70)

## 2019-11-16 LAB — RAPID URINE DRUG SCREEN, HOSP PERFORMED
Amphetamines: NOT DETECTED
Barbiturates: NOT DETECTED
Benzodiazepines: NOT DETECTED
Cocaine: NOT DETECTED
Opiates: POSITIVE — AB
Tetrahydrocannabinol: NOT DETECTED

## 2019-11-16 LAB — LACTIC ACID, PLASMA
Lactic Acid, Venous: 2.6 mmol/L (ref 0.5–1.9)
Lactic Acid, Venous: 2.4 mmol/L (ref 0.5–1.9)
Lactic Acid, Venous: 1.9 mmol/L (ref 0.5–1.9)

## 2019-11-16 LAB — BASIC METABOLIC PANEL
Anion gap: 10 (ref 5–15)
BUN: 21 mg/dL — ABNORMAL HIGH (ref 6–20)
CO2: 19 mmol/L — ABNORMAL LOW (ref 22–32)
Calcium: 8.9 mg/dL (ref 8.9–10.3)
Chloride: 105 mmol/L (ref 98–111)
Creatinine, Ser: 1.34 mg/dL — ABNORMAL HIGH (ref 0.44–1.00)
GFR calc Af Amer: 55 mL/min — ABNORMAL LOW (ref >60)
GFR calc non Af Amer: 47 mL/min — ABNORMAL LOW (ref >60)
Glucose, Bld: 181 mg/dL — ABNORMAL HIGH (ref 70–99)
Potassium: 4.9 mmol/L (ref 3.5–5.1)
Sodium: 134 mmol/L — ABNORMAL LOW (ref 135–145)

## 2019-11-16 LAB — MAGNESIUM
Magnesium: 1.7 mg/dL (ref 1.7–2.4)
Magnesium: 1.8 mg/dL (ref 1.7–2.4)

## 2019-11-16 LAB — CBC
HCT: 46.2 % — ABNORMAL HIGH (ref 36.0–46.0)
Hemoglobin: 15.4 g/dL — ABNORMAL HIGH (ref 12.0–15.0)
MCH: 27.9 pg (ref 26.0–34.0)
MCHC: 33.3 g/dL (ref 30.0–36.0)
MCV: 83.7 fL (ref 80.0–100.0)
Platelets: 331 10*3/uL (ref 150–400)
RBC: 5.52 MIL/uL — ABNORMAL HIGH (ref 3.87–5.11)
RDW: 12.2 % (ref 11.5–15.5)
WBC: 21.6 10*3/uL — ABNORMAL HIGH (ref 4.0–10.5)
nRBC: 0 % (ref 0.0–0.2)

## 2019-11-16 LAB — ECHOCARDIOGRAM COMPLETE
Height: 66.5 in
Weight: 2998.26 oz

## 2019-11-16 LAB — TROPONIN I (HIGH SENSITIVITY)
Troponin I (High Sensitivity): 226 ng/L (ref <18)
Troponin I (High Sensitivity): 235 ng/L (ref <18)
Troponin I (High Sensitivity): 239 ng/L (ref <18)
Troponin I (High Sensitivity): 274 ng/L (ref <18)
Troponin I (High Sensitivity): 278 ng/L (ref <18)
Troponin I (High Sensitivity): 289 ng/L (ref <18)
Troponin I (High Sensitivity): 290 ng/L (ref <18)
Troponin I (High Sensitivity): 305 ng/L (ref <18)

## 2019-11-16 LAB — HEMOGLOBIN A1C
Hgb A1c MFr Bld: 8.1 % — ABNORMAL HIGH (ref 4.8–5.6)
Mean Plasma Glucose: 186 mg/dL

## 2019-11-16 LAB — GLUCOSE, CAPILLARY
Glucose-Capillary: 143 mg/dL — ABNORMAL HIGH (ref 70–99)
Glucose-Capillary: 206 mg/dL — ABNORMAL HIGH (ref 70–99)
Glucose-Capillary: 211 mg/dL — ABNORMAL HIGH (ref 70–99)
Glucose-Capillary: 236 mg/dL — ABNORMAL HIGH (ref 70–99)
Glucose-Capillary: 249 mg/dL — ABNORMAL HIGH (ref 70–99)

## 2019-11-16 LAB — OSMOLALITY, URINE: Osmolality, Ur: 601 mOsm/kg (ref 300–900)

## 2019-11-16 LAB — PROCALCITONIN
Procalcitonin: 0.24 ng/mL
Procalcitonin: 0.57 ng/mL

## 2019-11-16 LAB — PHOSPHORUS: Phosphorus: 3.9 mg/dL (ref 2.5–4.6)

## 2019-11-16 LAB — TSH: TSH: 4.882 u[IU]/mL — ABNORMAL HIGH (ref 0.350–4.500)

## 2019-11-16 LAB — CREATININE, URINE, RANDOM: Creatinine, Urine: 180.6 mg/dL

## 2019-11-16 LAB — BRAIN NATRIURETIC PEPTIDE
B Natriuretic Peptide: 296.1 pg/mL — ABNORMAL HIGH (ref 0.0–100.0)
B Natriuretic Peptide: 665.2 pg/mL — ABNORMAL HIGH (ref 0.0–100.0)

## 2019-11-16 LAB — HIV ANTIBODY (ROUTINE TESTING W REFLEX): HIV Screen 4th Generation wRfx: NONREACTIVE

## 2019-11-16 LAB — SODIUM, URINE, RANDOM: Sodium, Ur: 52 mmol/L

## 2019-11-16 MED ORDER — AMLODIPINE BESYLATE 5 MG PO TABS
5.0000 mg | ORAL_TABLET | Freq: Every day | ORAL | Status: AC
  Administered 2019-11-16: 06:00:00 5 mg via ORAL
  Filled 2019-11-16: qty 1

## 2019-11-16 MED ORDER — PROCHLORPERAZINE EDISYLATE 10 MG/2ML IJ SOLN
10.0000 mg | Freq: Four times a day (QID) | INTRAMUSCULAR | Status: AC | PRN
  Administered 2019-11-16: 04:00:00 10 mg via INTRAVENOUS
  Filled 2019-11-16: qty 2

## 2019-11-16 MED ORDER — CHLORHEXIDINE GLUCONATE CLOTH 2 % EX PADS
6.0000 | MEDICATED_PAD | Freq: Every day | CUTANEOUS | Status: DC
  Administered 2019-11-16 – 2019-11-23 (×5): 6 via TOPICAL

## 2019-11-16 MED ORDER — DIGOXIN 0.25 MG/ML IJ SOLN
0.2500 mg | Freq: Every day | INTRAMUSCULAR | Status: DC
  Filled 2019-11-16: qty 1

## 2019-11-16 MED ORDER — MAGNESIUM SULFATE 2 GM/50ML IV SOLN
2.0000 g | Freq: Once | INTRAVENOUS | Status: AC
  Administered 2019-11-16: 2 g via INTRAVENOUS
  Filled 2019-11-16: qty 50

## 2019-11-16 MED ORDER — DIGOXIN 0.25 MG/ML IJ SOLN
0.5000 mg | Freq: Every day | INTRAMUSCULAR | Status: DC
  Filled 2019-11-16: qty 2

## 2019-11-16 NOTE — Progress Notes (Addendum)
Pt's MEWS noted to be yellow.  Vitals being frequently cycled. MD and RR made aware.  Close monitoring.

## 2019-11-16 NOTE — Progress Notes (Signed)
Lower extremity venous has been completed.   Preliminary results in CV Proc.   Blanch Media 11/16/2019 8:36 AM

## 2019-11-16 NOTE — Progress Notes (Signed)
ANTICOAGULATION CONSULT NOTE   Pharmacy Consult for Heparin Indication: pulmonary embolus  Allergies  Allergen Reactions  . Guaifenesin Anaphylaxis  . Levaquin [Levofloxacin In D5w] Shortness Of Breath and Rash  . Strawberry Extract Anaphylaxis  . Kiwi Extract Other (See Comments)    Other reaction(s): Other (See Comments)     Patient Measurements: Height: 5' 6.5" (168.9 cm) Weight: 187 lb 6.3 oz (85 kg) IBW/kg (Calculated) : 60.45 Heparin Dosing Weight: 78.4kg  Vital Signs: Temp: 97.4 F (36.3 C) (02/09 0400) Temp Source: Axillary (02/09 0400) BP: 122/111 (02/09 0400) Pulse Rate: 143 (02/09 0400)  Labs: Recent Labs    11/15/19 1455 11/15/19 1455 11/15/19 1800 11/15/19 2306 11/16/19 0033 11/16/19 0306  HGB 15.3*  --   --   --   --  15.4*  HCT 48.3*  --   --   --   --  46.2*  PLT 335  --   --   --   --  331  APTT  --   --  30  --   --   --   LABPROT  --   --  13.7  --   --   --   INR  --   --  1.1  --   --   --   HEPARINUNFRC  --   --   --   --   --  0.83*  CREATININE 1.47*  --   --   --   --   --   TROPONINIHS 27*   < > 83* 239* 226*  --    < > = values in this interval not displayed.    Estimated Creatinine Clearance: 52.5 mL/min (A) (by C-G formula based on SCr of 1.47 mg/dL (H)).   Medications:  Scheduled:  . atorvastatin  80 mg Oral Daily  . budesonide (PULMICORT) nebulizer solution  0.25 mg Nebulization Q12H  . buPROPion  100 mg Oral BID  . clopidogrel  75 mg Oral Daily  . digoxin  0.25 mg Intravenous Daily  . fluticasone furoate-vilanterol  1 puff Inhalation Daily  . gabapentin  300 mg Oral TID  . insulin aspart  0-15 Units Subcutaneous Q4H  . insulin glargine  15 Units Subcutaneous Daily  . levothyroxine  50 mcg Oral Daily  . methylphenidate  5 mg Oral Daily  . mirtazapine  45 mg Oral QHS  . mometasone-formoterol  2 puff Inhalation BID  . nicotine  21 mg Transdermal Daily  . sodium chloride flush  3 mL Intravenous Once     Assessment: Patient is a 54 yof that presents to the ED with complaints of chest pain. The patient was found to have a large PE with evidence of right heart strain (elevated trop). Pharmacy has been asked to dose heparin at this time.   Heparin level supratherapeutic (0.83) on gtt at 1400 units/hr. No bleeding noted.  Goal of Therapy:  Heparin level 0.3-0.7 units/ml Monitor platelets by anticoagulation protocol: Yes   Plan: - Decrease Heparin to 1250 units/hr  - Heparin level in 6 hrs   Christoper Fabian, PharmD, BCPS Please see amion for complete clinical pharmacist phone list 11/16/2019,4:22 AM

## 2019-11-16 NOTE — Progress Notes (Signed)
Pt transferred from ED to 4E10 at 01:32 am, appeared alert, anxious, oriented x 4, left side paralyzed per Pt base line prior deficits from CVA. Complained having nausea and pain on her left side, she stated  from fall multiple times at home. Pain scale 8/10.  Norco and Flexeril given. She 's able to rest and sleep better.    Heparin gtt at 1400 units/ hr on arrival.   We made aware about prolong QT from her EKG 12 leads. Consulted with pharmacist about antiemesis "ComPaZine" , pharmacist suggested it's safely to administer slowly. Medication given and her nausea symptom relieved.   Pt requested to be confidential, preferred staff not to release all her information to any of her friends and family members.   On arrival, her HR 140-149, sustained sinus tachycardia,  BP 151/115- 134/105 with narrow pulse pressure, Afebrile, temp 98.3 orally, RR 20-24, SPO2 98-97% on O2 NCL 2 LPM,  Notified on-call provider, M. Katherina Right, NP. New order received for Norvas PO and MD made aware about Pt's HR and BP,  suggested to continue to monitor and wait for morning round to evaluate these parameters.    CHG bath given, call bell within reach, room equipment instructions given. Continue to monitor.  Filiberto Pinks, RN

## 2019-11-16 NOTE — Evaluation (Signed)
Physical Therapy Evaluation Patient Details Name: Karla Hoover MRN: [id number] DOB: [birth year] Today's Date: 11/16/2019   History of Present Illness  Karla Hoover is a 49 y.o. female who presented from home with continuous chest pain and now some shortness of breath - found to have pulmonary emboli bilaterally. She has also had frequent falls. Medical history significant of COPD, DM2 , hx of CVA with left-sided weakness, HTN  Clinical Impression  Pt presents with an overall decrease in functional mobility secondary to above.  Pt lethargic throughout session, unable to report PLOF. Per chart review, pt was having frequent falls and would perform stand pivots to/from wheelchair using the wall or armrest to hold onto, received assist from family who she lives with, continues with L hemiparesis secondary to hx of CVA. Pt supine in bed and remained lethargic throughout session, opened eyes once to name and touch on L hand. Therapist provided verbal/tactile stimuli, sternal rub, cold washcloth to face and sat pt EOB, but patient remained lethargic/unable to arouse. Total assist +2 for supine<>sitting. RN notified of patients lethargy and difficulty to arouse. Pt would benefit from continued acute PT services to maximize functional mobility and independence prior to d/c to SNF to continue progressing functional mobility.     Follow Up Recommendations SNF;Supervision/Assistance - 24 hour    Equipment Recommendations  Other (comment)(tbd next venue)    Recommendations for Other Services       Precautions / Restrictions Precautions Precautions: Fall Precaution Comments: hx of L hemiparesis Restrictions Weight Bearing Restrictions: No LUE Weight Bearing: Non weight bearing LLE Weight Bearing: Non weight bearing      Mobility  Bed Mobility Overal bed mobility: Needs Assistance Bed Mobility: Rolling Rolling: Total assist;+2 for physical assistance         General bed mobility  comments: difficult to assess secondary to lethargy, attempted to sit EOB to arouse patient however pt total assist +2 for bed mobility and sitting EOB  Transfers Overall transfer level: (unable to assess 2/2 lethargy)              Modified Rankin (Stroke Patients Only) Modified Rankin (Stroke Patients Only) Pre-Morbid Rankin Score: Moderately severe disability Modified Rankin: Severe disability     Balance Overall balance assessment: Needs assistance Sitting-balance support: Feet supported Sitting balance-Leahy Scale: Zero Sitting balance - Comments: total assist +2 secondary to lethargy                                     Pertinent Vitals/Pain Pain Assessment: Faces Faces Pain Scale: No hurt    Home Living Family/patient expects to be discharged to:: Private residence Living Arrangements: Other relatives Available Help at Discharge: Family Type of Home: House         Home Equipment: Wheelchair - manual Additional Comments: per chart review pt lives in [place] with family (had been in [place] in [place] following CVA prior to coming to [place]). Per chart review famil was assisting patient with transfers/mobility however pt was having frequent falls and family having difficulty manage patient at home. Unsure of home entry/set up. Pt does have a w/c per chart review.    Prior Function Level of Independence: Needs assistance   Gait / Transfers Assistance Needed: uses wheelchair, assist for stand pivot transfers per chart review           Hand Dominance        Extremity/Trunk  Assessment   Upper Extremity Assessment Upper Extremity Assessment: Difficult to assess due to impaired cognition(unable to assess 2/2 lethargy, hx of L hemiparesis)    Lower Extremity Assessment Lower Extremity Assessment: Difficult to assess due to impaired cognition(unable to assess 2/2 lethargy, hx of L hemiparesis)       Communication   Communication: Other  (comment)(diffiult to assess secondary to lethargy)  Cognition Arousal/Alertness: Lethargic;Suspect due to medications   Overall Cognitive Status: Difficult to assess                                 General Comments: unable to formally assess cognition secondary to lethargy, unable to arouse patient with sternal rub, verbal/tactile stimuli and with movement to EOB.      General Comments General comments (skin integrity, edema, etc.): patient on 2L O2/min via nasal canual, BP 99/81, HR 130s bpm, and SpO2 92-99%    Exercises     Assessment/Plan    PT Assessment Patient needs continued PT services  PT Problem List Decreased strength;Decreased mobility;Decreased safety awareness;Decreased range of motion;Decreased coordination;Decreased knowledge of precautions;Decreased activity tolerance;Decreased cognition;Decreased balance;Decreased knowledge of use of DME       PT Treatment Interventions DME instruction;Therapeutic activities;Gait training;Therapeutic exercise;Patient/family education;Balance training;Wheelchair mobility training;Functional mobility training;Neuromuscular re-education    PT Goals (Current goals can be found in the Care Plan section)  Acute Rehab PT Goals Patient Stated Goal: per chart review- "go to skilled nursing facility" PT Goal Formulation: Patient unable to participate in goal setting Time For Goal Achievement: 11/30/19 Potential to Achieve Goals: Good    Frequency Min 3X/week   Barriers to discharge Inaccessible home environment;Decreased caregiver support pt requiring assist, unsure if family is able to provide 24/7 assist needed    Co-evaluation               AM-PAC PT "6 Clicks" Mobility  Outcome Measure Help needed turning from your back to your side while in a flat bed without using bedrails?: Total Help needed moving from lying on your back to sitting on the side of a flat bed without using bedrails?: Total Help needed  moving to and from a bed to a chair (including a wheelchair)?: Total Help needed standing up from a chair using your arms (e.g., wheelchair or bedside chair)?: Total Help needed to walk in hospital room?: Total Help needed climbing 3-5 steps with a railing? : Total 6 Click Score: 6    End of Session   Activity Tolerance: Patient limited by lethargy Patient left: in bed;with bed alarm set;with nursing/sitter in room;with call bell/phone within reach Nurse Communication: Mobility status;Other (comment)(lethargy, difficult to arouse) PT Visit Diagnosis: Muscle weakness (generalized) (M62.81);Difficulty in walking, not elsewhere classified (R26.2);History of falling (Z91.81);Hemiplegia and hemiparesis Hemiplegia - Right/Left: Left Hemiplegia - dominant/non-dominant: Non-dominant Hemiplegia - caused by: Cerebral infarction    Time: 1005-1020 PT Time Calculation (min) (ACUTE ONLY): 15 min   Charges:   PT Evaluation $PT Eval Moderate Complexity: 1 Mod          Cresenciano Genre, PT, DPT, CSRS Acute Rehab Office [phone]   Mindi Curling Schagen 11/16/2019, 12:06 PM

## 2019-11-16 NOTE — Progress Notes (Signed)
PROGRESS NOTE    OSHA RANE  OFB:[id number] DOB: 1971-03-21 DOA: 11/15/2019 PCP: Grayce Sessions, NP    Brief Narrative:  49 year old female with extensive medical issues including COPD, chronic hypoxia on home oxygen 2 to 3 L at home, type 2 diabetes, history of CVA with left-sided hemiplegia, hypertension, ongoing cigarette smoker and poor compliance presented to the emergency room with chest discomfort, shortness of breath, frequent falls and unable to maintain her daily activities of living at home.  Apparently, patient moved from [place] where she was at a nursing home to live with her sisters.  In the emergency room, tachycardic, blood pressure stable, acute kidney injury with creatinine 1.47, WBC 14.6.  Lactic acid 2.7. Treated with bolus IV fluids with improvement.  CTA shows bilateral submassive pulmonary embolism with CT scan evidence of right heart strain, minimally elevated troponins, urine examination consistent with UTI.  She was admitted with antibiotics and heparin infusion.   Assessment & Plan:   Principal Problem:   Bilateral pulmonary embolism (HCC) Active Problems:   Essential hypertension   COPD with chronic bronchitis (HCC)   DM (diabetes mellitus), type 2 (HCC)   History of stroke   AKI (acute kidney injury) (HCC)   Tobacco abuse   Elevated troponin  Bilateral pulmonary embolism with cor pulmonale, left femoral vein acute DVT: Thromboembolism due to immobility. Blood pressure is stabilizing after fluid resuscitation.  Continue maintenance IV fluids. Oxygen remains fairly stable, she is already on 3 L oxygen at home. Continue heparin infusion until clinical stabilization and improvement of heart rate. 2D echocardiogram is pending, if significant right heart strain, will discuss with interventional radiology about catheter tPA.  Will convert to oral anticoagulation after clinical stabilization.  Sepsis, present on admission: Probably due to UTI.   Treated with broad-spectrum antibiotics and now remains on cefepime.  Continue until final blood cultures and urine cultures.  Her presentation could probably from thromboembolism also.  Elevated troponins: Due to acute PE.  Stable.  History of stroke with left-sided hemiplegia: Will need more therapies and placement for rehab.  She is on Plavix that we will continue.  Already on a statin and tolerating well.  COPD with chronic hypoxic respiratory failure: Stable.  No evidence of exacerbation.  On bronchodilator as needed.  Type 2 diabetes on insulin with hyperglycemia: Continue current doses of insulin, will check A1c.   DVT prophylaxis: Heparin infusion Code Status: Full code Family Communication: None Disposition Plan: patient is from home. Anticipated DC to SNF, Barriers to discharge on active treatment with IV drips, heparin infusions and antibiotics.   Consultants:   PCCM, signed up  Procedures:   None  Antimicrobials:  Anti-infectives (From admission, onward)   Start     Dose/Rate Route Frequency Ordered Stop   11/16/19 1800  vancomycin (VANCOREADY) IVPB 1250 mg/250 mL  Status:  Discontinued     1,250 mg 166.7 mL/hr over 90 Minutes Intravenous Every 24 hours 11/15/19 1805 11/15/19 1816   11/16/19 0600  ceFEPIme (MAXIPIME) 2 g in sodium chloride 0.9 % 100 mL IVPB     2 g 200 mL/hr over 30 Minutes Intravenous Every 12 hours 11/15/19 1805     11/15/19 1815  vancomycin (VANCOREADY) IVPB 1250 mg/250 mL  Status:  Discontinued     1,250 mg 166.7 mL/hr over 90 Minutes Intravenous Every 24 hours 11/15/19 1816 11/15/19 2208   11/15/19 1800  ceFEPIme (MAXIPIME) 2 g in sodium chloride 0.9 % 100 mL IVPB  2 g 200 mL/hr over 30 Minutes Intravenous  Once 11/15/19 1756 11/15/19 1910   11/15/19 1800  metroNIDAZOLE (FLAGYL) IVPB 500 mg     500 mg 100 mL/hr over 60 Minutes Intravenous  Once 11/15/19 1756 11/15/19 1950   11/15/19 1800  vancomycin (VANCOCIN) IVPB 1000 mg/200 mL premix   Status:  Discontinued     1,000 mg 200 mL/hr over 60 Minutes Intravenous  Once 11/15/19 1756 11/15/19 1816         Subjective: Patient seen and examined.  Try to interview her, however she wants to sleep.  Denies any complaints. Discussed with bedside nursing. Telemetry shows tachycardia.  Blood pressures are stable.  Objective: Vitals:   11/16/19 0500 11/16/19 0539 11/16/19 0600 11/16/19 0700  BP: (!) 122/100  (!) 121/98 (!) 116/94  Pulse: (!) 143 (!) 140 (!) 139 (!) 137  Resp: (!) 21 19 20 20   Temp:      TempSrc:      SpO2: 97% 97% 97% 97%  Weight:      Height:        Intake/Output Summary (Last 24 hours) at 11/16/2019 0949 Last data filed at 11/16/2019 0647 Gross per 24 hour  Intake 531.58 ml  Output 0 ml  Net 531.58 ml   Filed Weights   11/15/19 1832  Weight: 85 kg    Examination:  General exam: Appears calm and comfortable and sleepy.  Not in any distress.  Chronically sick looking. Respiratory system: Clear to auscultation. Respiratory effort normal.  On 3 L of oxygen. Cardiovascular system: S1 & S2 heard, tachycardic. no JVD, murmurs, rubs, gallops or clicks. Left leg has 1+ edema.  Diffuse tenderness.  Gastrointestinal system: Abdomen is nondistended, soft and nontender. No organomegaly or masses felt. Normal bowel sounds heard. Central nervous system: Alert and oriented but sleepy.. No focal neurological deficits. Extremities: Left hemiparesis with left upper extremity contracture, weakness of both left upper and lower extremities. Skin: No rashes, lesions or ulcers Psychiatry: Judgement and insight appear normal. Mood & affect flat.    Data Reviewed: I have personally reviewed following labs and imaging studies  CBC: Recent Labs  Lab 11/15/19 1455 11/16/19 0306  WBC 14.6* 21.6*  HGB 15.3* 15.4*  HCT 48.3* 46.2*  MCV 89.6 83.7  PLT 335 331   Basic Metabolic Panel: Recent Labs  Lab 11/15/19 1455 11/16/19 0033 11/16/19 0306  NA 134*  --  134*    K 3.9  --  5.5*  CL 96*  --  100  CO2 23  --  23  GLUCOSE 313*  --  257*  BUN 18  --  21*  CREATININE 1.47*  --  1.52*  CALCIUM 9.1  --  8.9  MG  --  1.7 1.8  PHOS  --   --  3.9   GFR: Estimated Creatinine Clearance: 50.8 mL/min (A) (by C-G formula based on SCr of 1.52 mg/dL (H)). Liver Function Tests: Recent Labs  Lab 11/15/19 1800 11/16/19 0306  AST 34 67*  ALT 18 45*  ALKPHOS 115 122  BILITOT 0.5 0.8  PROT 6.6 7.1  ALBUMIN 3.4* 3.6   No results for input(s): LIPASE, AMYLASE in the last 168 hours. No results for input(s): AMMONIA in the last 168 hours. Coagulation Profile: Recent Labs  Lab 11/15/19 1800  INR 1.1   Cardiac Enzymes: No results for input(s): CKTOTAL, CKMB, CKMBINDEX, TROPONINI in the last 168 hours. BNP (last 3 results) No results for input(s): PROBNP in the last  8760 hours. HbA1C: No results for input(s): HGBA1C in the last 72 hours. CBG: Recent Labs  Lab 11/15/19 2316 11/16/19 0429 11/16/19 0825  GLUCAP 224* 249* 236*   Lipid Profile: No results for input(s): CHOL, HDL, LDLCALC, TRIG, CHOLHDL, LDLDIRECT in the last 72 hours. Thyroid Function Tests: Recent Labs    11/16/19 0306  TSH 4.882*   Anemia Panel: No results for input(s): VITAMINB12, FOLATE, FERRITIN, TIBC, IRON, RETICCTPCT in the last 72 hours. Sepsis Labs: Recent Labs  Lab 11/15/19 1542 11/15/19 1800 11/16/19 0026 11/16/19 0033 11/16/19 0306  PROCALCITON  --   --   --  0.24  --   LATICACIDVEN 2.7* 2.3* 2.6*  --  2.4*    Recent Results (from the past 240 hour(s))  Blood culture (routine x 2)     Status: None (Preliminary result)   Collection Time: 11/15/19  4:00 PM   Specimen: BLOOD RIGHT FOREARM  Result Value Ref Range Status   Specimen Description BLOOD RIGHT FOREARM  Final   Special Requests   Final    BOTTLES DRAWN AEROBIC AND ANAEROBIC Blood Culture results may not be optimal due to an inadequate volume of blood received in culture bottles   Culture   Final     NO GROWTH < 24 HOURS Performed at [facility], [address], [place], [place] [address]    Report Status PENDING  Incomplete  Respiratory Panel by RT PCR (Flu A&B, Covid) - Nasopharyngeal Swab     Status: None   Collection Time: 11/15/19  4:19 PM   Specimen: Nasopharyngeal Swab  Result Value Ref Range Status   SARS Coronavirus 2 by RT PCR NEGATIVE NEGATIVE Final    Comment: (NOTE) SARS-CoV-2 target nucleic acids are NOT DETECTED. The SARS-CoV-2 RNA is generally detectable in upper respiratoy specimens during the acute phase of infection. The lowest concentration of SARS-CoV-2 viral copies this assay can detect is 131 copies/mL. A negative result does not preclude SARS-Cov-2 infection and should not be used as the sole basis for treatment or other patient management decisions. A negative result may occur with  improper specimen collection/handling, submission of specimen other than nasopharyngeal swab, presence of viral mutation(s) within the areas targeted by this assay, and inadequate number of viral copies (<131 copies/mL). A negative result must be combined with clinical observations, patient history, and epidemiological information. The expected result is Negative. Fact Sheet for Patients:  https://www.moore.com/ Fact Sheet for Healthcare Providers:  https://www.young.biz/ This test is not yet ap proved or cleared by the [place] FDA and  has been authorized for detection and/or diagnosis of SARS-CoV-2 by FDA under an Emergency Use Authorization (EUA). This EUA will remain  in effect (meaning this test can be used) for the duration of the COVID-19 declaration under Section 564(b)(1) of the Act, 21 U.S.C. section 360bbb-3(b)(1), unless the authorization is terminated or revoked sooner.    Influenza A by PCR NEGATIVE NEGATIVE Final   Influenza B by PCR NEGATIVE NEGATIVE Final    Comment: (NOTE) The Xpert Xpress  SARS-CoV-2/FLU/RSV assay is intended as an aid in  the diagnosis of influenza from Nasopharyngeal swab specimens and  should not be used as a sole basis for treatment. Nasal washings and  aspirates are unacceptable for Xpert Xpress SARS-CoV-2/FLU/RSV  testing. Fact Sheet for Patients: https://www.moore.com/ Fact Sheet for Healthcare Providers: https://www.young.biz/ This test is not yet approved or cleared by the [place] FDA and  has been authorized for detection and/or diagnosis of SARS-CoV-2  by  FDA under an Emergency Use Authorization (EUA). This EUA will remain  in effect (meaning this test can be used) for the duration of the  Covid-19 declaration under Section 564(b)(1) of the Act, 21  U.S.C. section 360bbb-3(b)(1), unless the authorization is  terminated or revoked. Performed at [facility], [address] [place], [place] [address]   Blood culture (routine x 2)     Status: None (Preliminary result)   Collection Time: 11/15/19  9:04 PM   Specimen: BLOOD  Result Value Ref Range Status   Specimen Description BLOOD RIGHT ANTECUBITAL  Final   Special Requests   Final    BOTTLES DRAWN AEROBIC AND ANAEROBIC Blood Culture adequate volume   Culture   Final    NO GROWTH < 12 HOURS Performed at [facility], [address] [place], [place] [address]    Report Status PENDING  Incomplete         Radiology Studies: DG Chest 2 View  Result Date: 11/15/2019 CLINICAL DATA:  Chest pain and shortness of breath EXAM: CHEST - 2 VIEW COMPARISON:  November 03, 2019 FINDINGS: There is mild left base atelectasis. The lungs elsewhere are clear. Heart size and pulmonary vascular normal. No adenopathy. No pneumothorax. There is mild degenerative change in the thoracic spine. IMPRESSION: Mild left base atelectasis. Lungs elsewhere clear. Cardiac silhouette within normal limits. Electronically Signed   By: Bretta Bang III M.D.   On:  11/15/2019 15:15   CT Angio Chest PE W and/or Wo Contrast  Result Date: 11/15/2019 CLINICAL DATA:  Shortness of breath, chest pain EXAM: CT ANGIOGRAPHY CHEST WITH CONTRAST TECHNIQUE: Multidetector CT imaging of the chest was performed using the standard protocol during bolus administration of intravenous contrast. Multiplanar CT image reconstructions and MIPs were obtained to evaluate the vascular anatomy. CONTRAST:  OMNIPAQUE IOHEXOL 350 MG/ML SOLN COMPARISON:  06/21/2012 FINDINGS: Cardiovascular: Bilateral moderate to large volume pulmonary emboli noted involving arterial branches in all lobes of both lungs. Evidence of right heart strain. RV/LV ratio 2.0. Heart is normal size. Aorta normal caliber. Mediastinum/Nodes: No mediastinal, hilar, or axillary adenopathy. Trachea and esophagus are unremarkable. Thyroid unremarkable. Lungs/Pleura: Left base atelectasis.  No effusions. Upper Abdomen: Imaging into the upper abdomen shows no acute findings. Musculoskeletal: Chest wall soft tissues are unremarkable. No acute bony abnormality. Review of the MIP images confirms the above findings. IMPRESSION: Positive for acute PE with CT evidence of right heart strain (RV/LV Ratio = 2.0) consistent with at least submassive (intermediate risk) PE. The presence of right heart strain has been associated with an increased risk of morbidity and mortality. Please activate Code PE by paging [phone]. Critical Value/emergent results were called by telephone at the time of interpretation on 11/15/2019 at 9:31 pm to provider [facility] , who verbally acknowledged these results. Electronically Signed   By: Charlett Nose M.D.   On: 11/15/2019 21:32   VAS [place] LOWER EXTREMITY VENOUS (DVT)  Result Date: 11/16/2019  Lower Venous DVTStudy Indications: Pulmonary embolism.  Comparison Study: no prior Performing Technologist: Blanch Media RVS  Examination Guidelines: A complete evaluation includes B-mode imaging, spectral Doppler,  color Doppler, and power Doppler as needed of all accessible portions of each vessel. Bilateral testing is considered an integral part of a complete examination. Limited examinations for reoccurring indications may be performed as noted. The reflux portion of the exam is performed with the patient in reverse Trendelenburg.  +---------+---------------+---------+-----------+----------+--------------+ RIGHT    CompressibilityPhasicitySpontaneityPropertiesThrombus Aging +---------+---------------+---------+-----------+----------+--------------+  CFV      Full           Yes      Yes                                 +---------+---------------+---------+-----------+----------+--------------+ SFJ      Full                                                        +---------+---------------+---------+-----------+----------+--------------+ FV Prox  Full                                                        +---------+---------------+---------+-----------+----------+--------------+ FV Mid   Full                                                        +---------+---------------+---------+-----------+----------+--------------+ FV DistalFull                                                        +---------+---------------+---------+-----------+----------+--------------+ PFV      Full                                                        +---------+---------------+---------+-----------+----------+--------------+ POP      Full           Yes      Yes                                 +---------+---------------+---------+-----------+----------+--------------+ PTV      Full                                                        +---------+---------------+---------+-----------+----------+--------------+ PERO     Full                                                        +---------+---------------+---------+-----------+----------+--------------+    +---------+---------------+---------+-----------+----------+--------------+ LEFT     CompressibilityPhasicitySpontaneityPropertiesThrombus Aging +---------+---------------+---------+-----------+----------+--------------+ CFV      Full           Yes      Yes                                 +---------+---------------+---------+-----------+----------+--------------+  SFJ      Full                                                        +---------+---------------+---------+-----------+----------+--------------+ FV Prox  None                                         Acute          +---------+---------------+---------+-----------+----------+--------------+ FV Mid   None                                         Acute          +---------+---------------+---------+-----------+----------+--------------+ FV DistalNone                                         Acute          +---------+---------------+---------+-----------+----------+--------------+ PFV      Full                                                        +---------+---------------+---------+-----------+----------+--------------+ POP      Full           Yes      Yes                                 +---------+---------------+---------+-----------+----------+--------------+ PTV      Full                                                        +---------+---------------+---------+-----------+----------+--------------+ PERO     Full                                                        +---------+---------------+---------+-----------+----------+--------------+     Summary: RIGHT: - There is no evidence of deep vein thrombosis in the lower extremity.  - No cystic structure found in the popliteal fossa.  LEFT: - Findings consistent with acute deep vein thrombosis involving the left femoral vein. - No cystic structure found in the popliteal fossa.  *See table(s) above for measurements and observations.     Preliminary         Scheduled Meds: . atorvastatin  80 mg Oral Daily  . budesonide (PULMICORT) nebulizer solution  0.25 mg Nebulization Q12H  . buPROPion  100 mg Oral BID  . clopidogrel  75 mg Oral Daily  . fluticasone furoate-vilanterol  1 puff Inhalation Daily  . gabapentin  300 mg Oral TID  .  insulin aspart  0-15 Units Subcutaneous Q4H  . insulin glargine  15 Units Subcutaneous Daily  . levothyroxine  50 mcg Oral Daily  . methylphenidate  5 mg Oral Daily  . mirtazapine  45 mg Oral QHS  . mometasone-formoterol  2 puff Inhalation BID  . nicotine  21 mg Transdermal Daily  . sodium chloride flush  3 mL Intravenous Once   Continuous Infusions: . sodium chloride Stopped (11/16/19 0002)  . ceFEPime (MAXIPIME) IV 2 g (11/16/19 0531)  . heparin 1,250 Units/hr (11/16/19 0427)     LOS: 0 days    Time spent: 35 minutes    Barb Merino, MD [facility] Pager [phone]

## 2019-11-16 NOTE — Progress Notes (Signed)
OT Cancellation Note  Patient Details Name: Karla Hoover MRN: [id number] DOB: 1971/08/15   Cancelled Treatment:    Reason Eval/Treat Not Completed: Fatigue/lethargy limiting ability to participate;Medical issues which prohibited therapy. OT will try back when appropriate  Galen Manila 11/16/2019, 11:28 AM

## 2019-11-16 NOTE — Significant Event (Signed)
Rapid Response Event Note  Overview: Time Called: 1020 Arrival Time: 1105   Delayed to arrival d/t simultaneous rapid response call. RN made aware of delay, encouraged to call RR RN if needed prior to arrival.   Event Type: Other (Comment)(Second set of eyes.) Called for pt being lethargic, hard to arouse. Pt received Norco, Xanax, and Flexeril at 0345. BUN 21, Cr 1.52.  Initial Focused Assessment: Pt lying in bed, does not appear distressed. HR 133, BP 124/101, RR 15, SpO2 97% on 2LNC. Lung sounds are clear, diminished. HR tachycardic, no adventitious heart sounds. Pt arouses to painful stimuli. Pt moves right side. Pt responds "ouch" to painful stimuli in left arm and left leg, no movement noted (this is baseline per primary RN). PERRLA, 58mm. Left pupil sluggish, right pupil response brisk. Pt tells me she's tired and wants me to leave her alone, speech clear. Extremities are cold, rectal temperature checked. Gag reflex present.  Interventions: Rectal temp 101F CBG check  Plan of Care (if not transferred): -Primary RN, Candise Bowens, spoke with MD who at this time did not feel intervention was necessary.  -Continue to monitor VS. Monitor for pt ability to maintain her airway.  -Avoid narcotics and benzos at this time.  Call rapid response for further needs.   Event Summary:   at      at    Outcome: Stayed in room and stabalized  Event End Time: 1120  Jennye Moccasin

## 2019-11-16 NOTE — ED Notes (Signed)
Attempted to call report to 4E. 

## 2019-11-16 NOTE — Progress Notes (Signed)
Echocardiogram 2D Echocardiogram has been performed.  Karla Hoover 11/16/2019, 12:12 PM

## 2019-11-16 NOTE — [place] FL2 (Signed)
[place] MEDICAID FL2 LEVEL OF CARE SCREENING TOOL     IDENTIFICATION  Patient Name: Karla Hoover Birthdate: 03/16/1971 Sex: female Admission Date (Current Location): 11/15/2019  [facility] and [place] Number:  Producer, television/film/video and Address:  [facility], [address], [place], [place] [address]      Provider Number: [id number]  Attending Physician Name and Address:  Dorcas Carrow, MD  Relative Name and Phone Number:       Current Level of Care: Hospital Recommended Level of Care: Skilled Nursing Facility Prior Approval Number:    Date Approved/Denied:   PASRR Number: [id number]  Discharge Plan: SNF    Current Diagnoses: Patient Active Problem List   Diagnosis Date Noted  . Tobacco abuse 11/16/2019  . Elevated troponin 11/16/2019  . Bilateral pulmonary embolism (HCC) 11/15/2019  . DM (diabetes mellitus), type 2 (HCC) 11/15/2019  . History of stroke 11/15/2019  . AKI (acute kidney injury) (HCC) 11/15/2019  . Essential hypertension 05/04/2009  . 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    Orientation RESPIRATION BLADDER Height & Weight     Self, Time, Situation, Place  O2(nasal cannula 2L) External catheter, Continent Weight: 187 lb 6.3 oz (85 kg) Height:  5' 6.5" (168.9 cm)  BEHAVIORAL SYMPTOMS/MOOD NEUROLOGICAL BOWEL NUTRITION STATUS      Continent Diet(please see discharged summary)  AMBULATORY STATUS COMMUNICATION OF NEEDS Skin   Limited Assist Verbally Normal                       Personal Care Assistance Level of Assistance  Bathing, Feeding, Dressing Bathing Assistance: Limited assistance Feeding assistance: Independent Dressing Assistance: Limited assistance     Functional Limitations Info  Sight, Hearing, Speech Sight Info: Adequate Hearing Info: Adequate Speech Info: Adequate    SPECIAL CARE FACTORS FREQUENCY  PT (By licensed PT), OT (By licensed OT)     PT Frequency: 3x per week OT Frequency: 3x  per week            Contractures Contractures Info: Not present    Additional Factors Info  Code Status, Allergies, Insulin Sliding Scale Code Status Info: FULL Allergies Info: Guaifenesin,Levaquin,Strawberry Extract,Kiwi Extract   Insulin Sliding Scale Info: insulin aspart (novoLOG) injection 0-15 Units       Current Medications (11/16/2019):  This is the current hospital active medication list Current Facility-Administered Medications  Medication Dose Route Frequency Provider Last Rate Last Admin  . acetaminophen (TYLENOL) tablet 650 mg  650 mg Oral Q6H PRN Therisa Doyne, MD       Or  . acetaminophen (TYLENOL) suppository 650 mg  650 mg Rectal Q6H PRN Doutova, Anastassia, MD      . atorvastatin (LIPITOR) tablet 80 mg  80 mg Oral Daily Doutova, Anastassia, MD      . budesonide (PULMICORT) nebulizer solution 0.25 mg  0.25 mg Nebulization Q12H Doutova, Anastassia, MD   0.25 mg at 11/16/19 1043  . buPROPion (WELLBUTRIN SR) 12 hr tablet 100 mg  100 mg Oral BID Doutova, Anastassia, MD      . ceFEPIme (MAXIPIME) 2 g in sodium chloride 0.9 % 100 mL IVPB  2 g Intravenous Q12H Doutova, Anastassia, MD 200 mL/hr at 11/16/19 0531 2 g at 11/16/19 0531  . clopidogrel (PLAVIX) tablet 75 mg  75 mg Oral Daily Doutova, Anastassia, MD      . fluticasone furoate-vilanterol (BREO ELLIPTA) 100-25 MCG/INH 1 puff  1 puff Inhalation Daily Therisa Doyne, MD      .  heparin ADULT infusion 100 units/mL (25000 units/23mL sodium chloride 0.45%)  1,250 Units/hr Intravenous Continuous Franky Macho, RPH 12.5 mL/hr at 11/16/19 0427 1,250 Units/hr at 11/16/19 0427  . insulin aspart (novoLOG) injection 0-15 Units  0-15 Units Subcutaneous Q4H Toy Baker, MD   5 Units at 11/16/19 1254  . insulin glargine (LANTUS) injection 15 Units  15 Units Subcutaneous Daily Toy Baker, MD   15 Units at 11/16/19 1255  . levothyroxine (SYNTHROID) tablet 50 mcg  50 mcg Oral Daily Toy Baker, MD    50 mcg at 11/16/19 0533  . mometasone-formoterol (DULERA) 200-5 MCG/ACT inhaler 2 puff  2 puff Inhalation BID Toy Baker, MD   2 puff at 11/16/19 1043  . nicotine (NICODERM CQ - dosed in mg/24 hours) patch 21 mg  21 mg Transdermal Daily Doutova, Anastassia, MD      . sodium chloride flush (NS) 0.9 % injection 3 mL  3 mL Intravenous Once Toy Baker, MD         Discharge Medications: Please see discharge summary for a list of discharge medications.  Relevant Imaging Results:  Relevant Lab Results:   Additional Information SSN  # [facility] [place], [place]

## 2019-11-16 NOTE — Progress Notes (Signed)
Pt with no urine output since beginning of shift.  Pt still extremely sleepy and lethargic.  Bladder scan demonstrates 580 ml urine in bladder.  Pt in/out cathed yielding 550 ml urine.  Will continue to monitor.

## 2019-11-16 NOTE — Progress Notes (Signed)
Pt noted to be very lethargic and sleepy all morning.  PT and RN sat pt up and talked to her.  Pt minimally responded to sternal rub.  Pt's vitals are stable. Pupils are reactive. MD made aware.  Rapid Response made aware.  Will continue to monitor.

## 2019-11-16 NOTE — Consult Note (Signed)
NAME:  Karla Hoover, MRN:  [id number], DOB:  August 17, 1971, LOS: 0 ADMISSION DATE:  11/15/2019, CONSULTATION DATE:  11/15/19 REFERRING MD:  Renaye Rakers - EM, CHIEF COMPLAINT:  Chest pain  Brief History   49 yo F PMH COPD, DM, HTN, prior CVA who presented to ED with CC chest pain. Patient reports that chest pain began after a hall on 11/03/19, for which she previously presented to ED and was dx with musculoskeletal strain. Patient evidently sustained second fall today, PTA, while pivoting to bathroom. Patient fell, landing on L side and feeling residual soreness and pain on L side of body as well as chest.   In ED, patient initial SBP 90, prompting code sepsis initiation, receiving 2L IVF and initiation of vnac, cefepime, flagyl. Subsequently HDS. Further workup in ED for chest pain revealed bilateral PE on CTA chest and patient was started on heparin per pharmacy.   Past Medical History    has no past surgical history on file.   has a past medical history of Asthma, COPD (chronic obstructive pulmonary disease) (HCC), Diabetes mellitus without complication (HCC), Hypertension, and Stroke (HCC).  Allergies  Allergen Reactions  . Guaifenesin Anaphylaxis  . Levaquin [Levofloxacin In D5w] Shortness Of Breath and Rash  . Strawberry Extract Anaphylaxis  . Kiwi Extract Other (See Comments)    Other reaction(s): Other (See Comments)       Significant Hospital Events   11/15/19 presents to ED with Chest pain, found to have PE   Consults:  PCCM   Procedures:    Significant Diagnostic Tests:  CTA chest 2/8> bilateral pulmonary emboli involving arterial branches in all lobes of both lungs. RV/LV 2.0  Micro Data:  SARS Cov2 2/8> neg  Flu A/B 2/8 > neg BCx 2/8>   Antimicrobials:  Cefepime 2/8 Flagyl 2/8 Vanc 2/8   Interim history/subjective:   11/16/2019 -currently sleeping on 2 L nasal cannula with a heart rate of 133.  Normal blood pressure and in fact hypertensive normal respiratory  rate.-Mild tachypnea. Her high-sensitivity troponin this morning was 289.  Lactic acid still elevated -2.4 with poor clearance.  Her echocardiogram showed normal left ventricular ejection fraction but severe RV strain. BNP - 296 . AKI + - creat 1.5mg %  Duplex LE - neg for DVT  PESI score now 97 - class 3 (HR 133 wth 2L [place] and copd hx and female age 21)  Mag 1.8/QTc this AM  Objective   Blood pressure (!) 127/98, pulse (!) 132, temperature (!) 100.5 F (38.1 C), temperature source Rectal, resp. rate 20, height 5' 6.5" (1.689 m), weight 85 kg, last menstrual period 10/20/2019, SpO2 98 %.        Intake/Output Summary (Last 24 hours) at 11/16/2019 1250 Last data filed at 11/16/2019 0647 Gross per 24 hour  Intake 531.58 ml  Output 0 ml  Net 531.58 ml   Filed Weights   11/15/19 1832  Weight: 85 kg   At the time of my visit she was sleeping soundly and snoring.  Oxygen was on 2 L nasal cannula symmetric respirations respiratory rate around 24.  Heart rate around 133 blood pressure normal.  There is no obvious bleeding IV heparin was infusing.  Extremities and abdomen look intact.  Per [facility] - she got lot of her CNS meds early  Hours and is sleepy as a result  Resolved Hospital Problem list     Assessment & Plan:   Bilateral Pulmonary Embolism, submassive Acute hypoxemcic resp failure due  to above  11/16/2019 - PESI class 3 with score 90s, poor clearance of lactate.  RV strain + -severe. On IV heparin gtt. Same since admission other htan fact initial hypotension improved but at risk for decompensation   P - continue o2  - for pulse ox > 88% - continue IV heparin (prieviously and now on plavix) - check repeat lactate, bnp, trop - to risk stratify   - if worsening - consider EKOS  - if stable/better-0 continue IV heparin gtt but use systemic lysius/EKOS as rescue  - bed rest  Noted in presednce of plavix - lysis can carry exaggerated risk for bleeding  Prolonged qtc/low  Mag   11/16/2019 - Qtc still > 500msec. Mag < 2gm% - noed to be on companzine (now stopped), ritalin (still ongoing), wellbutrin,    Plan  - replete mag - avoid compazine  - Check EKG at 7pm and again 11/17/19    AKI -likely prerenal, possible hypovolemia vs other hypotensive etiology above. CTA increases risk for worsening  11/16/2019 - making urine  P  - recheck bmet now - and if higher - give more fluids   COPD -home symbicort, albuterol, trelegy. Prescribed 2-3LNC, but is non-compliant as O2 is not at present residence  P -Supplemental O2 for SpO2 goal 88-92%  -inpatient breo, pulmicort  DM P -SSI   Chronic Pain  - too sleepy afte rhome regimen P - hold per [facility]  Depression - on  home remeron and buproprion   Plan  - [facility] MD to decide continue v dc (QTc is long)  Hypothyroidism P - home synthroid   Hx HTN P -tele monitoring -holding home regimen at present   Prior CVA -residual L sided weakness P -supportive care  - remains on home plavix  Frequent falls P -PT/OT when better; bed rest now    Best practice:  Diet: recommend heart healthy  Pain/Anxiety/Delirium protocol (if indicated): no acute regimen  VAP protocol (if indicated): na DVT prophylaxis: heparin gtt  GI prophylaxis: na Glucose control: SSI  Mobility: BR Code Status: Full  Family Communication: Patient expresses desire for no family communication at time of admit.  For 11/16/2019 - per [facility]  Disposition: SDU per [facility]    SIGNATURE    Dr. Kalman ShanMurali Janah Mcculloh, M.D., F.C.C.P,  Pulmonary and Critical Care Medicine Staff Physician, [facility] Center Director - Interstitial Lung Disease  Program  Pulmonary Fibrosis [facility] at [facility] [place], [place], [id number]  Pager: [phone], If no answer or between  15:00h - 7:00h: call [phone] Telephone: [phone]  12:50 PM 11/16/2019    LABS    PULMONARY No results for  input(s): PHART, PCO2ART, PO2ART, HCO3, TCO2, O2SAT in the last 168 hours.  Invalid input(s): PCO2, PO2  CBC Recent Labs  Lab 11/15/19 1455 11/16/19 0306  HGB 15.3* 15.4*  HCT 48.3* 46.2*  WBC 14.6* 21.6*  PLT 335 331    COAGULATION Recent Labs  Lab 11/15/19 1800  INR 1.1    CARDIAC  No results for input(s): TROPONINI in the last 168 hours. No results for input(s): PROBNP in the last 168 hours.   CHEMISTRY Recent Labs  Lab 11/15/19 1455 11/16/19 0033 11/16/19 0306  NA 134*  --  134*  K 3.9  --  5.5*  CL 96*  --  100  CO2 23  --  23  GLUCOSE 313*  --  257*  BUN 18  --  21*  CREATININE 1.47*  --  1.52*  CALCIUM 9.1  --  8.9  MG  --  1.7 1.8  PHOS  --   --  3.9   Estimated Creatinine Clearance: 50.8 mL/min (A) (by C-G formula based on SCr of 1.52 mg/dL (H)).   LIVER Recent Labs  Lab 11/15/19 1800 11/16/19 0306  AST 34 67*  ALT 18 45*  ALKPHOS 115 122  BILITOT 0.5 0.8  PROT 6.6 7.1  ALBUMIN 3.4* 3.6  INR 1.1  --      INFECTIOUS Recent Labs  Lab 11/15/19 1800 11/16/19 0026 11/16/19 0033 11/16/19 0306  LATICACIDVEN 2.3* 2.6*  --  2.4*  PROCALCITON  --   --  0.24  --      ENDOCRINE CBG (last 3)  Recent Labs    11/16/19 0429 11/16/19 0825 11/16/19 1150  GLUCAP 249* 236* 206*         IMAGING x48h  - image(s) personally visualized  -   highlighted in bold DG Chest 2 View  Result Date: 11/15/2019 CLINICAL DATA:  Chest pain and shortness of breath EXAM: CHEST - 2 VIEW COMPARISON:  November 03, 2019 FINDINGS: There is mild left base atelectasis. The lungs elsewhere are clear. Heart size and pulmonary vascular normal. No adenopathy. No pneumothorax. There is mild degenerative change in the thoracic spine. IMPRESSION: Mild left base atelectasis. Lungs elsewhere clear. Cardiac silhouette within normal limits. Electronically Signed   By: Lowella Grip III M.D.   On: 11/15/2019 15:15   CT Angio Chest PE W and/or Wo Contrast  Result Date:  11/15/2019 CLINICAL DATA:  Shortness of breath, chest pain EXAM: CT ANGIOGRAPHY CHEST WITH CONTRAST TECHNIQUE: Multidetector CT imaging of the chest was performed using the standard protocol during bolus administration of intravenous contrast. Multiplanar CT image reconstructions and MIPs were obtained to evaluate the vascular anatomy. CONTRAST:  169mL OMNIPAQUE IOHEXOL 350 MG/ML SOLN COMPARISON:  06/21/2012 FINDINGS: Cardiovascular: Bilateral moderate to large volume pulmonary emboli noted involving arterial branches in all lobes of both lungs. Evidence of right heart strain. RV/LV ratio 2.0. Heart is normal size. Aorta normal caliber. Mediastinum/Nodes: No mediastinal, hilar, or axillary adenopathy. Trachea and esophagus are unremarkable. Thyroid unremarkable. Lungs/Pleura: Left base atelectasis.  No effusions. Upper Abdomen: Imaging into the upper abdomen shows no acute findings. Musculoskeletal: Chest wall soft tissues are unremarkable. No acute bony abnormality. Review of the MIP images confirms the above findings. IMPRESSION: Positive for acute PE with CT evidence of right heart strain (RV/LV Ratio = 2.0) consistent with at least submassive (intermediate risk) PE. The presence of right heart strain has been associated with an increased risk of morbidity and mortality. Please activate Code PE by paging [phone]. Critical Value/emergent results were called by telephone at the time of interpretation on 11/15/2019 at 9:31 pm to provider [facility] , who verbally acknowledged these results. Electronically Signed   By: Rolm Baptise M.D.   On: 11/15/2019 21:32   ECHOCARDIOGRAM COMPLETE  Result Date: 11/16/2019    ECHOCARDIOGRAM REPORT   Patient Name:   LARAYNE BAXLEY Date of Exam: 11/16/2019 Medical Rec #:  [id number]         Height:       66.5 in Accession #:    [id number]        Weight:       187.4 lb Date of Birth:  [birth year]         BSA:  1.96 m Patient Age:    48 years          BP:            124/101 mmHg Patient Gender: F                 HR:           133 bpm. Exam Location:  Inpatient Procedure: 2D Echo, Color Doppler and Cardiac Doppler Indications:    I26.02 Pulmonary embolus  History:        Patient has no prior history of Echocardiogram examinations.                 COPD; Risk Factors:Hypertension and Diabetes.  Sonographer:    Irving Burton Senior RDCS Referring Phys: Consepcion Hearing ANASTASSIA DOUTOVA IMPRESSIONS  1. Left ventricular ejection fraction, by estimation, is 65 to 70%. The left ventricle has normal function. The left ventrical has no regional wall motion abnormalities. There is mildly increased left ventricular hypertrophy. Left ventricular diastolic parameters are indeterminate. Elevated left ventricular end-diastolic pressure.  2. Right ventricular systolic function is severely reduced. The right ventricular size is moderately enlarged. There is mildly elevated pulmonary artery systolic pressure. The estimated right ventricular systolic pressure is 37.2 mmHg.  3. Right atrial size was mild to moderately dilated.  4. No evidence of mitral valve regurgitation.  5. The aortic valve is normal in structure and function. Aortic valve regurgitation is not visualized. No aortic stenosis is present.  6. The inferior vena cava is normal in size with <50% respiratory variability, suggesting right atrial pressure of 8 mmHg. FINDINGS  Left Ventricle: Left ventricular ejection fraction, by estimation, is 65 to 70%. The left ventricle has normal function. The left ventricle has no regional wall motion abnormalities. The left ventricular internal cavity size was normal in size. There is  mildly increased left ventricular hypertrophy. Concentric left ventricular hypertrophy. Left ventricular diastolic parameters are indeterminate. Right Ventricle: The right ventricular size is moderately enlarged. No increase in right ventricular wall thickness. Right ventricular systolic function is severely reduced. There is mildly  elevated pulmonary artery systolic pressure. The tricuspid regurgitant velocity is 2.70 m/s, and with an assumed right atrial pressure of 8 mmHg, the estimated right ventricular systolic pressure is 37.2 mmHg. Left Atrium: Left atrial size was normal in size. Right Atrium: Right atrial size was mild to moderately dilated. Pericardium: There is no evidence of pericardial effusion. Mitral Valve: The mitral valve is normal in structure and function. Normal mobility of the mitral valve leaflets. No evidence of mitral valve regurgitation. No evidence of mitral valve stenosis. Tricuspid Valve: The tricuspid valve is normal in structure. Tricuspid valve regurgitation is mild . No evidence of tricuspid stenosis. Aortic Valve: The aortic valve is normal in structure and function. Aortic valve regurgitation is not visualized. No aortic stenosis is present. Pulmonic Valve: The pulmonic valve was normal in structure. Pulmonic valve regurgitation is not visualized. No evidence of pulmonic stenosis. Aorta: The aortic root is normal in size and structure. Venous: The inferior vena cava is normal in size with less than 50% respiratory variability, suggesting right atrial pressure of 8 mmHg. The inferior vena cava and the hepatic vein show a normal flow pattern. IAS/Shunts: No atrial level shunt detected by color flow Doppler.  LEFT VENTRICLE PLAX 2D LVIDd:         3.17 cm  Diastology LVIDs:         2.24 cm  LV e' lateral:   2.61 cm/s  LV PW:         1.28 cm  LV E/e' lateral: 27.7 LV IVS:        1.25 cm  LV e' medial:    3.59 cm/s LVOT diam:     1.90 cm  LV E/e' medial:  20.1 LV SV:         20.41 ml LV SV Index:   11.42 LVOT Area:     2.84 cm  RIGHT VENTRICLE RV S prime:     4.90 cm/s TAPSE (M-mode): 0.9 cm LEFT ATRIUM             Index       RIGHT ATRIUM           Index LA diam:        2.50 cm 1.28 cm/m  RA Area:     17.00 cm LA Vol (A2C):   36.3 ml 18.56 ml/m RA Volume:   53.00 ml  27.09 ml/m LA Vol (A4C):   17.6 ml 9.00 ml/m  LA Biplane Vol: 25.4 ml 12.98 ml/m  AORTIC VALVE LVOT Vmax:   56.90 cm/s LVOT Vmean:  39.500 cm/s LVOT VTI:    0.072 m  AORTA Ao Root diam: 2.70 cm Ao Asc diam:  2.90 cm MITRAL VALVE                        TRICUSPID VALVE MV Area (PHT): 8.43 cm             TR Peak grad:   29.2 mmHg MV Decel Time: 90 msec              TR Vmax:        270.00 cm/s MV E velocity: 72.30 cm/s 103 cm/s MV A velocity: 46.60 cm/s 70.3 cm/s SHUNTS MV E/A ratio:  1.55       1.5       Systemic VTI:  0.07 m                                     Systemic Diam: 1.90 cm Armanda Magic MD Electronically signed by Armanda Magic MD Signature Date/Time: 11/16/2019/12:17:23 PM    Final    VAS [place] LOWER EXTREMITY VENOUS (DVT)  Result Date: 11/16/2019  Lower Venous DVTStudy Indications: Pulmonary embolism.  Comparison Study: no prior Performing Technologist: Blanch Media RVS  Examination Guidelines: A complete evaluation includes B-mode imaging, spectral Doppler, color Doppler, and power Doppler as needed of all accessible portions of each vessel. Bilateral testing is considered an integral part of a complete examination. Limited examinations for reoccurring indications may be performed as noted. The reflux portion of the exam is performed with the patient in reverse Trendelenburg.  +---------+---------------+---------+-----------+----------+--------------+ RIGHT    CompressibilityPhasicitySpontaneityPropertiesThrombus Aging +---------+---------------+---------+-----------+----------+--------------+ CFV      Full           Yes      Yes                                 +---------+---------------+---------+-----------+----------+--------------+ SFJ      Full                                                        +---------+---------------+---------+-----------+----------+--------------+  FV Prox  Full                                                        +---------+---------------+---------+-----------+----------+--------------+ FV  Mid   Full                                                        +---------+---------------+---------+-----------+----------+--------------+ FV DistalFull                                                        +---------+---------------+---------+-----------+----------+--------------+ PFV      Full                                                        +---------+---------------+---------+-----------+----------+--------------+ POP      Full           Yes      Yes                                 +---------+---------------+---------+-----------+----------+--------------+ PTV      Full                                                        +---------+---------------+---------+-----------+----------+--------------+ PERO     Full                                                        +---------+---------------+---------+-----------+----------+--------------+   +---------+---------------+---------+-----------+----------+--------------+ LEFT     CompressibilityPhasicitySpontaneityPropertiesThrombus Aging +---------+---------------+---------+-----------+----------+--------------+ CFV      Full           Yes      Yes                                 +---------+---------------+---------+-----------+----------+--------------+ SFJ      Full                                                        +---------+---------------+---------+-----------+----------+--------------+ FV Prox  None  Acute          +---------+---------------+---------+-----------+----------+--------------+ FV Mid   None                                         Acute          +---------+---------------+---------+-----------+----------+--------------+ FV DistalNone                                         Acute          +---------+---------------+---------+-----------+----------+--------------+ PFV      Full                                                         +---------+---------------+---------+-----------+----------+--------------+ POP      Full           Yes      Yes                                 +---------+---------------+---------+-----------+----------+--------------+ PTV      Full                                                        +---------+---------------+---------+-----------+----------+--------------+ PERO     Full                                                        +---------+---------------+---------+-----------+----------+--------------+     Summary: RIGHT: - There is no evidence of deep vein thrombosis in the lower extremity.  - No cystic structure found in the popliteal fossa.  LEFT: - Findings consistent with acute deep vein thrombosis involving the left femoral vein. - No cystic structure found in the popliteal fossa.  *See table(s) above for measurements and observations. Electronically signed by Waverly Ferrari MD on 11/16/2019 at 12:14:25 PM.    Final

## 2019-11-16 NOTE — TOC Initial Note (Addendum)
Transition of Care [facility]) - Initial/Assessment Note    Patient Details  Name: Karla Hoover MRN: [id number] Date of Birth: 1971-06-18  Transition of Care [facility]) CM/SW Contact:    Vinie Sill, [place] Phone Number: 11/16/2019, 2:53 PM  Clinical Narrative:                  CSW visit with the patient at bedside. CSW introduced self and explained role. CSW discussed PT recommendations. Patient states she lives with her sister, Karla Hoover and her sister's spouse and son. Patient states her sister drove her here from [place]. Patient expressed she don't believe her sister can take care of her and is agreeable to ST rehab at [facility]. Patient states no SNF preference. CSW explained the SNF process and explained she would not be allowed to smoke. Patient states she understands.  CSW discussed potential barriers. CSW explained will need to expand SNF choices outside of [facility], patient was agreeable. Patient states no further questions at this time.  Patient gave CSW permission to contact sister, Karla Hoover.   CSW received no answer .  Thurmond Butts, MSW, [place] Clinical Social Worker   Expected Discharge Plan: Skilled Nursing Facility Barriers to Discharge: Continued Medical Work up, SNF Pending bed offer   Patient Goals and CMS Choice        Expected Discharge Plan and Services Expected Discharge Plan: [place] In-house Referral: Clinical Social Work     Living arrangements for the past 2 months: Apartment                                      Prior Living Arrangements/Services Living arrangements for the past 2 months: Apartment Lives with:: Self, Siblings, Relatives Patient language and need for interpreter reviewed:: No Do you feel safe going back to the place where you live?: No      Need for Family Participation in Patient Care: Yes (Comment) Care giver support system in place?: Yes (comment)(yes, but the patient states her sister can not take care of  her)      Activities of Daily Living      Permission Sought/Granted Permission sought to share information with : Family Supports, Customer service manager, Case Manager                Emotional Assessment Appearance:: Appears older than stated age Attitude/Demeanor/Rapport: Engaged Affect (typically observed): Accepting Orientation: : Oriented to Self, Oriented to Place, Oriented to  Time, Oriented to Situation Alcohol / Substance Use: Not Applicable Psych Involvement: No (comment)  Admission diagnosis:  Pulmonary embolism (HCC) [I26.99] Bilateral pulmonary embolism ([place]) [I26.99] Patient Active Problem List   Diagnosis Date Noted  . Tobacco abuse 11/16/2019  . Elevated troponin 11/16/2019  . Bilateral pulmonary embolism ([place]) 11/15/2019  . DM (diabetes mellitus), type 2 ([place]) 11/15/2019  . History of stroke 11/15/2019  . AKI (acute kidney injury) ([place]) 11/15/2019  . Essential hypertension 05/04/2009  . 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 [place]. [place] [address] Phone: [phone] Fax: [place] [address] [place]. [place]. [place] [address] Phone: [phone] Fax: [place], [place]  [place] [address] Phone: [phone] Fax: [phone]  [facility] - [place], [place] - [place] [address] [place] [address] Phone: [phone] Fax: [phone]     Social Determinants of Health (SDOH) Interventions    Readmission Risk Interventions No flowsheet data found.

## 2019-11-16 NOTE — Progress Notes (Signed)
Pt's MEWS noted to be red.  Frequent vitals being cycled.  MD and RR made aware.  Will continue to monitor.

## 2019-11-16 NOTE — Progress Notes (Addendum)
Pt has no urine output for 9 hours since transferred from ED. Last in and out cath at 09:30 pm yesterday per [facility] reported.  Bladder scan found urine retention  259 ml. Pt 's sleeping, We will continue to monitor.  Filiberto Pinks, RN

## 2019-11-16 NOTE — Progress Notes (Signed)
ANTICOAGULATION CONSULT NOTE   Pharmacy Consult for Heparin Indication: pulmonary embolus  Allergies  Allergen Reactions  . Guaifenesin Anaphylaxis  . Levaquin [Levofloxacin In D5w] Shortness Of Breath and Rash  . Strawberry Extract Anaphylaxis  . Kiwi Extract Other (See Comments)    Other reaction(s): Other (See Comments)     Patient Measurements: Height: 5' 6.5" (168.9 cm) Weight: 187 lb 6.3 oz (85 kg) IBW/kg (Calculated) : 60.45 Heparin Dosing Weight: 78.4kg  Vital Signs: Temp: 100.5 F (38.1 C) (02/09 1226) Temp Source: Rectal (02/09 1226) BP: 127/98 (02/09 1230) Pulse Rate: 132 (02/09 1230)  Labs: Recent Labs    11/15/19 1455 11/15/19 1455 11/15/19 1800 11/15/19 2306 11/16/19 0306 11/16/19 0306 11/16/19 0515 11/16/19 0712 11/16/19 0840 11/16/19 1208  HGB 15.3*  --   --   --  15.4*  --   --   --   --   --   HCT 48.3*  --   --   --  46.2*  --   --   --   --   --   PLT 335  --   --   --  331  --   --   --   --   --   APTT  --   --  30  --   --   --   --   --   --   --   LABPROT  --   --  13.7  --   --   --   --   --   --   --   INR  --   --  1.1  --   --   --   --   --   --   --   HEPARINUNFRC  --   --   --   --  0.83*  --   --   --   --  0.49  CREATININE 1.47*  --   --   --  1.52*  --   --   --   --   --   TROPONINIHS 27*   < > 83*   < > 278*   < > 305* 290* 289*  --    < > = values in this interval not displayed.    Estimated Creatinine Clearance: 50.8 mL/min (A) (by C-G formula based on SCr of 1.52 mg/dL (H)).   Medications:  Scheduled:  . atorvastatin  80 mg Oral Daily  . budesonide (PULMICORT) nebulizer solution  0.25 mg Nebulization Q12H  . buPROPion  100 mg Oral BID  . clopidogrel  75 mg Oral Daily  . fluticasone furoate-vilanterol  1 puff Inhalation Daily  . insulin aspart  0-15 Units Subcutaneous Q4H  . insulin glargine  15 Units Subcutaneous Daily  . levothyroxine  50 mcg Oral Daily  . methylphenidate  5 mg Oral Daily  .  mometasone-formoterol  2 puff Inhalation BID  . nicotine  21 mg Transdermal Daily  . sodium chloride flush  3 mL Intravenous Once    Assessment: Patient is a 33 yof that presents to the ED with complaints of chest pain. The patient was found to have a large PE with evidence of right heart strain (elevated trop) and also noted with duplex positive for LLE DVT . Pharmacy has been asked to dose heparin.  -heparin level at goal     Goal of Therapy:  Heparin level 0.3-0.7 units/ml Monitor platelets by anticoagulation protocol: Yes  Plan: -No heparin changes -Daily heparin level and CBC  Hildred Laser, PharmD Clinical Pharmacist **Pharmacist phone directory can now be found on [email or web address] ([email or web address]).  Listed under [place].

## 2019-11-17 LAB — CBC
HCT: 42 % (ref 36.0–46.0)
Hemoglobin: 14.1 g/dL (ref 12.0–15.0)
MCH: 28.3 pg (ref 26.0–34.0)
MCHC: 33.6 g/dL (ref 30.0–36.0)
MCV: 84.2 fL (ref 80.0–100.0)
Platelets: 229 10*3/uL (ref 150–400)
RBC: 4.99 MIL/uL (ref 3.87–5.11)
RDW: 12.5 % (ref 11.5–15.5)
WBC: 15.2 10*3/uL — ABNORMAL HIGH (ref 4.0–10.5)
nRBC: 0 % (ref 0.0–0.2)

## 2019-11-17 LAB — COMPREHENSIVE METABOLIC PANEL
ALT: 177 U/L — ABNORMAL HIGH (ref 0–44)
AST: 182 U/L — ABNORMAL HIGH (ref 15–41)
Albumin: 2.7 g/dL — ABNORMAL LOW (ref 3.5–5.0)
Alkaline Phosphatase: 101 U/L (ref 38–126)
Anion gap: 11 (ref 5–15)
BUN: 19 mg/dL (ref 6–20)
CO2: 19 mmol/L — ABNORMAL LOW (ref 22–32)
Calcium: 8.7 mg/dL — ABNORMAL LOW (ref 8.9–10.3)
Chloride: 103 mmol/L (ref 98–111)
Creatinine, Ser: 1.24 mg/dL — ABNORMAL HIGH (ref 0.44–1.00)
GFR calc Af Amer: 60 mL/min — ABNORMAL LOW (ref >60)
GFR calc non Af Amer: 52 mL/min — ABNORMAL LOW (ref >60)
Glucose, Bld: 94 mg/dL (ref 70–99)
Potassium: 4.1 mmol/L (ref 3.5–5.1)
Sodium: 133 mmol/L — ABNORMAL LOW (ref 135–145)
Total Bilirubin: 0.4 mg/dL (ref 0.3–1.2)
Total Protein: 5.8 g/dL — ABNORMAL LOW (ref 6.5–8.1)

## 2019-11-17 LAB — URINE CULTURE: Culture: NO GROWTH

## 2019-11-17 LAB — HEPARIN LEVEL (UNFRACTIONATED)
Heparin Unfractionated: 0.27 IU/mL — ABNORMAL LOW (ref 0.30–0.70)
Heparin Unfractionated: 0.23 IU/mL — ABNORMAL LOW (ref 0.30–0.70)
Heparin Unfractionated: 0.42 IU/mL (ref 0.30–0.70)

## 2019-11-17 LAB — GLUCOSE, CAPILLARY
Glucose-Capillary: 100 mg/dL — ABNORMAL HIGH (ref 70–99)
Glucose-Capillary: 164 mg/dL — ABNORMAL HIGH (ref 70–99)
Glucose-Capillary: 130 mg/dL — ABNORMAL HIGH (ref 70–99)
Glucose-Capillary: 128 mg/dL — ABNORMAL HIGH (ref 70–99)
Glucose-Capillary: 219 mg/dL — ABNORMAL HIGH (ref 70–99)

## 2019-11-17 LAB — PROCALCITONIN: Procalcitonin: 0.43 ng/mL

## 2019-11-17 LAB — LACTIC ACID, PLASMA
Lactic Acid, Venous: 1.5 mmol/L (ref 0.5–1.9)
Lactic Acid, Venous: 2.9 mmol/L (ref 0.5–1.9)

## 2019-11-17 LAB — PHOSPHORUS: Phosphorus: 2.4 mg/dL — ABNORMAL LOW (ref 2.5–4.6)

## 2019-11-17 LAB — MAGNESIUM: Magnesium: 2.2 mg/dL (ref 1.7–2.4)

## 2019-11-17 MED ORDER — BUDESONIDE 0.5 MG/2ML IN SUSP
0.5000 mg | Freq: Two times a day (BID) | RESPIRATORY_TRACT | Status: DC
  Administered 2019-11-17 – 2019-11-23 (×11): 0.5 mg via RESPIRATORY_TRACT
  Filled 2019-11-17 (×12): qty 2

## 2019-11-17 MED ORDER — IPRATROPIUM BROMIDE 0.02 % IN SOLN: 0.5000 mg | Freq: Four times a day (QID) | RESPIRATORY_TRACT | Status: DC

## 2019-11-17 MED ORDER — IPRATROPIUM BROMIDE 0.02 % IN SOLN
0.5000 mg | Freq: Two times a day (BID) | RESPIRATORY_TRACT | Status: DC
  Administered 2019-11-17 – 2019-11-18 (×2): 0.5 mg via RESPIRATORY_TRACT
  Filled 2019-11-17 (×2): qty 2.5

## 2019-11-17 MED ORDER — POLYETHYLENE GLYCOL 3350 17 G PO PACK
17.0000 g | PACK | Freq: Every day | ORAL | Status: DC | PRN
  Administered 2019-11-17 – 2019-11-19 (×3): 17 g via ORAL
  Filled 2019-11-17 (×4): qty 1

## 2019-11-17 MED ORDER — ARFORMOTEROL TARTRATE 15 MCG/2ML IN NEBU
15.0000 ug | INHALATION_SOLUTION | Freq: Two times a day (BID) | RESPIRATORY_TRACT | Status: DC
  Administered 2019-11-17 – 2019-11-23 (×11): 15 ug via RESPIRATORY_TRACT
  Filled 2019-11-17 (×12): qty 2

## 2019-11-17 MED ORDER — HEPARIN BOLUS VIA INFUSION
1000.0000 [IU] | Freq: Once | INTRAVENOUS | Status: AC
  Administered 2019-11-17: 13:00:00 1000 [IU] via INTRAVENOUS
  Filled 2019-11-17: qty 1000

## 2019-11-17 MED ORDER — HYDROCODONE-ACETAMINOPHEN 7.5-325 MG PO TABS
1.0000 | ORAL_TABLET | Freq: Four times a day (QID) | ORAL | Status: DC | PRN
  Administered 2019-11-17: 1 via ORAL
  Filled 2019-11-17: qty 1

## 2019-11-17 NOTE — Progress Notes (Signed)
CONSULT NOTE   Pharmacy Consult for Heparin, cefepime Indication: pulmonary embolus  Allergies  Allergen Reactions  . Guaifenesin Anaphylaxis  . Levaquin [Levofloxacin In D5w] Shortness Of Breath and Rash  . Strawberry Extract Anaphylaxis  . Kiwi Extract Other (See Comments)    Other reaction(s): Other (See Comments)     Patient Measurements: Height: 5' 6.5" (168.9 cm) Weight: 187 lb 6.3 oz (85 kg) IBW/kg (Calculated) : 60.45 Heparin Dosing Weight: 78.4kg  Vital Signs: Temp: 98.2 F (36.8 C) (02/10 1635) Temp Source: Oral (02/10 1635) BP: 126/81 (02/10 1635) Pulse Rate: 87 (02/10 1635)  Labs: Recent Labs    11/15/19 1800 11/15/19 2306 11/16/19 0306 11/16/19 0306 11/16/19 0515 11/16/19 0840 11/16/19 1208 11/16/19 1208 11/16/19 1449 11/17/19 0343 11/17/19 1038 11/17/19 1840  HGB  --   --  15.4*   < >  --   --   --   --  14.4 14.1  --   --   HCT  --   --  46.2*  --   --   --   --   --  43.0 42.0  --   --   PLT  --   --  331  --   --   --   --   --  257 229  --   --   APTT 30  --   --   --   --   --   --   --   --   --   --   --   LABPROT 13.7  --   --   --   --   --   --   --   --   --   --   --   INR 1.1  --   --   --   --   --   --   --   --   --   --   --   HEPARINUNFRC  --   --  0.83*   < >  --   --  0.49   < >  --  0.27* 0.23* 0.42  CREATININE  --   --  1.52*  --   --   --   --   --  1.34* 1.24*  --   --   TROPONINIHS 83*   < > 278*  --    < > 289* 274*  --  235*  --   --   --    < > = values in this interval not displayed.    Estimated Creatinine Clearance: 62.2 mL/min (A) (by C-G formula based on SCr of 1.24 mg/dL (H)).   Medications:  Scheduled:  . arformoterol  15 mcg Nebulization BID  . atorvastatin  80 mg Oral Daily  . budesonide (PULMICORT) nebulizer solution  0.5 mg Nebulization Q12H  . buPROPion  100 mg Oral BID  . Chlorhexidine Gluconate Cloth  6 each Topical Daily  . clopidogrel  75 mg Oral Daily  . insulin aspart  0-15 Units Subcutaneous  Q4H  . insulin glargine  15 Units Subcutaneous Daily  . ipratropium  0.5 mg Nebulization BID  . levothyroxine  50 mcg Oral Daily  . nicotine  21 mg Transdermal Daily  . sodium chloride flush  3 mL Intravenous Once    Assessment: Patient is a 4 yof that presents to the ED with complaints of chest pain. The patient was found to have a large PE with  evidence of right heart strain (elevated trop) and also noted with duplex positive for LLE DVT . Pharmacy has been asked to dose heparin. Plans noted for catheter directed thrombolysis today -heparin level is 0.42 on 1600 units/hr    Goal of Therapy:  Heparin level 0.3-0.7 units/ml Monitor platelets by anticoagulation protocol: Yes   Plan: -Continue heparin at 1600 units/hr -Confirmatory heparin level in 6 hours and daily wth CBC daily    Jenasis Straley A. Levada Dy, PharmD, BCPS, FNKF Clinical Pharmacist [place] Please utilize [facility] for appropriate phone number to reach the unit pharmacist ([place])

## 2019-11-17 NOTE — Progress Notes (Signed)
OT Cancellation Note  Patient Details Name: AMARYLIS ROVITO MRN: [id number] DOB: 1971/07/28   Cancelled Treatment:    Reason Eval/Treat Not Completed: Medical issues which prohibited therapy;Fatigue/lethargy limiting ability to participate;Other (comment). Per pt's RN , she has been sleeping heavily; lethargic and will be going to IR today for bronchoscopy. OT will try back tomorrow as appropriate  Galen Manila 11/17/2019, 11:30 AM

## 2019-11-17 NOTE — Plan of Care (Signed)

## 2019-11-17 NOTE — Progress Notes (Signed)
PROGRESS NOTE    Karla Hoover  YNW:[id number] DOB: Jan 27, 1971 DOA: 11/15/2019 PCP: Grayce SessionsEdwards, Michelle P, NP    Brief Narrative:  49 year old female with extensive medical issues including COPD, chronic hypoxia on home oxygen 2 to 3 L at home, type 2 diabetes, history of CVA with left-sided hemiplegia, hypertension, ongoing cigarette smoker and poor compliance presented to the emergency room with chest discomfort, shortness of breath, frequent falls and unable to maintain her daily activities of living at home.  Apparently, patient moved from [place] where she was at a nursing home to live with her sisters.  In the emergency room, tachycardic, blood pressure stable, acute kidney injury with creatinine 1.47, WBC 14.6.  Lactic acid 2.7. Treated with bolus IV fluids with improvement.  CTA shows bilateral submassive pulmonary embolism with CT scan evidence of right heart strain, minimally elevated troponins, urine examination consistent with UTI.  She was admitted with antibiotics and heparin infusion.   Assessment & Plan:   Principal Problem:   Bilateral pulmonary embolism (HCC) Active Problems:   Essential hypertension   COPD with chronic bronchitis (HCC)   DM (diabetes mellitus), type 2 (HCC)   History of stroke   AKI (acute kidney injury) (HCC)   Tobacco abuse   Elevated troponin  Bilateral pulmonary embolism with cor pulmonale, left femoral vein acute DVT:  Thromboembolism due to immobility. Blood pressure is stabilizing after fluid resuscitation.  Continue maintenance IV fluids. Remains tachycardic but otherwise stable. Oxygen remains fairly stable, she is already on 2 L oxygen at home. Continue heparin infusion until clinical stabilization and improvement of heart rate. 2D echocardiogram shows right ventricular strain.   We will discuss with interventional radiology about catheter tPA.  Will convert to oral anticoagulation after clinical stabilization. Appreciate pulmonary  input.  Sepsis, present on admission: Probably due to UTI.  Treated with broad-spectrum antibiotics and now remains on cefepime.  Continue until final blood cultures and urine cultures.  Her presentation could probably from thromboembolism. Blood cultures negative so far.  Urine culture with multiple organisms. We will continue antibiotics until clinical improvement.  Elevated troponins: Due to acute PE.  Stable.  History of stroke with left-sided hemiplegia: Will need more therapies and placement for rehab.  She is on Plavix that we will continue.  Already on a statin and tolerating well.  COPD with chronic hypoxic respiratory failure: Stable.  No evidence of exacerbation.  On bronchodilator as needed.  Type 2 diabetes on insulin with hyperglycemia: Continue current doses of insulin, A1c is 8.1.  Prolonged QT interval: Improved with electrolyte replacement and stopping SSRI.  Acute renal failure: improving, adequate urine out put.  Continue monitoring.   DVT prophylaxis: Heparin infusion Code Status: Full code Family Communication: None Disposition Plan: patient is from home. Anticipated DC to SNF, Barriers to discharge on active treatment with IV drips, heparin infusions and antibiotics.   Consultants:  PCCM,  Interventional radiology Procedures:   None  Antimicrobials:  Anti-infectives (From admission, onward)   Start     Dose/Rate Route Frequency Ordered Stop   11/16/19 1800  vancomycin (VANCOREADY) IVPB 1250 mg/250 mL  Status:  Discontinued     1,250 mg 166.7 mL/hr over 90 Minutes Intravenous Every 24 hours 11/15/19 1805 11/15/19 1816   11/16/19 0600  ceFEPIme (MAXIPIME) 2 g in sodium chloride 0.9 % 100 mL IVPB     2 g 200 mL/hr over 30 Minutes Intravenous Every 12 hours 11/15/19 1805     11/15/19 1815  vancomycin (  VANCOREADY) IVPB 1250 mg/250 mL  Status:  Discontinued     1,250 mg 166.7 mL/hr over 90 Minutes Intravenous Every 24 hours 11/15/19 1816 11/15/19 2208    11/15/19 1800  ceFEPIme (MAXIPIME) 2 g in sodium chloride 0.9 % 100 mL IVPB     2 g 200 mL/hr over 30 Minutes Intravenous  Once 11/15/19 1756 11/15/19 1910   11/15/19 1800  metroNIDAZOLE (FLAGYL) IVPB 500 mg     500 mg 100 mL/hr over 60 Minutes Intravenous  Once 11/15/19 1756 11/15/19 1950   11/15/19 1800  vancomycin (VANCOCIN) IVPB 1000 mg/200 mL premix  Status:  Discontinued     1,000 mg 200 mL/hr over 60 Minutes Intravenous  Once 11/15/19 1756 11/15/19 1816         Subjective: Patient seen and examined.  No overnight events.  Since yesterday evening she is awake. Denies any chest pain or shortness of breath.  She could pivot herself to the bedside commode which she tries to do it.  Wants catheter out. Remains afebrile.  Blood pressure stabilized. Telemetry shows persistent tachycardia, patient without palpitations.  Objective: Vitals:   11/17/19 0001 11/17/19 0414 11/17/19 0742 11/17/19 0817  BP: (!) 97/96 96/84 114/86   Pulse:  (!) 125 (!) 128 (!) 126  Resp:  16 20 20   Temp:  97.8 F (36.6 C) 99.3 F (37.4 C)   TempSrc:  Oral Oral   SpO2:  91% 95% 96%  Weight:      Height:        Intake/Output Summary (Last 24 hours) at 11/17/2019 0839 Last data filed at 11/17/2019 0300 Gross per 24 hour  Intake 742.14 ml  Output 580 ml  Net 162.14 ml   Filed Weights   11/15/19 1832  Weight: 85 kg    Examination:  General exam: Appears calm and comfortable on 2 L oxygen. Not in any distress.  Chronically sick looking. Respiratory system: Clear to auscultation. Respiratory effort normal.  On 2 L of oxygen. Cardiovascular system: S1 & S2 heard, tachycardic. no JVD, murmurs, rubs, gallops or clicks. Left leg has 1+ edema.  Diffuse tenderness.  Gastrointestinal system: Abdomen is nondistended, soft and nontender. No organomegaly or masses felt. Normal bowel sounds heard. Foley catheter with clear urine. Central nervous system: Alert and oriented x4 .  Left-sided complete  hemiplegia, upper extremity 0/5, lower extremity 1/5.   Extremities: Left hemiparesis with left upper extremity contracture,  Skin: No rashes, lesions or ulcers Psychiatry: Judgement and insight appear normal. Mood & affect normal.    Data Reviewed: I have personally reviewed following labs and imaging studies  CBC: Recent Labs  Lab 11/15/19 1455 11/16/19 0306 11/16/19 1449 11/17/19 0343  WBC 14.6* 21.6* 17.9* 15.2*  NEUTROABS  --   --  11.5*  --   HGB 15.3* 15.4* 14.4 14.1  HCT 48.3* 46.2* 43.0 42.0  MCV 89.6 83.7 84.0 84.2  PLT 335 331 257 229   Basic Metabolic Panel: Recent Labs  Lab 11/15/19 1455 11/16/19 0033 11/16/19 0306 11/16/19 1449 11/17/19 0343  NA 134*  --  134* 134* 133*  K 3.9  --  5.5* 4.9 4.1  CL 96*  --  100 105 103  CO2 23  --  23 19* 19*  GLUCOSE 313*  --  257* 181* 94  BUN 18  --  21* 21* 19  CREATININE 1.47*  --  1.52* 1.34* 1.24*  CALCIUM 9.1  --  8.9 8.9 8.7*  MG  --  1.7 1.8  --  2.2  PHOS  --   --  3.9  --  2.4*   GFR: Estimated Creatinine Clearance: 62.2 mL/min (A) (by C-G formula based on SCr of 1.24 mg/dL (H)). Liver Function Tests: Recent Labs  Lab 11/15/19 1800 11/16/19 0306 11/17/19 0343  AST 34 67* 182*  ALT 18 45* 177*  ALKPHOS 115 122 101  BILITOT 0.5 0.8 0.4  PROT 6.6 7.1 5.8*  ALBUMIN 3.4* 3.6 2.7*   No results for input(s): LIPASE, AMYLASE in the last 168 hours. No results for input(s): AMMONIA in the last 168 hours. Coagulation Profile: Recent Labs  Lab 11/15/19 1800  INR 1.1   Cardiac Enzymes: No results for input(s): CKTOTAL, CKMB, CKMBINDEX, TROPONINI in the last 168 hours. BNP (last 3 results) No results for input(s): PROBNP in the last 8760 hours. HbA1C: Recent Labs    11/16/19 0306  HGBA1C 8.1*   CBG: Recent Labs  Lab 11/16/19 1150 11/16/19 2030 11/16/19 2353 11/17/19 0417 11/17/19 0745  GLUCAP 206* 211* 143* 100* 164*   Lipid Profile: No results for input(s): CHOL, HDL, LDLCALC, TRIG,  CHOLHDL, LDLDIRECT in the last 72 hours. Thyroid Function Tests: Recent Labs    11/16/19 0306  TSH 4.882*   Anemia Panel: No results for input(s): VITAMINB12, FOLATE, FERRITIN, TIBC, IRON, RETICCTPCT in the last 72 hours. Sepsis Labs: Recent Labs  Lab 11/16/19 0026 11/16/19 0033 11/16/19 0306 11/16/19 1449 11/16/19 2337 11/17/19 0343  PROCALCITON  --  0.24  --  0.57  --  0.43  LATICACIDVEN 2.6*  --  2.4* 1.9 2.9*  --     Recent Results (from the past 240 hour(s))  Blood culture (routine x 2)     Status: None (Preliminary result)   Collection Time: 11/15/19  4:00 PM   Specimen: BLOOD RIGHT FOREARM  Result Value Ref Range Status   Specimen Description BLOOD RIGHT FOREARM  Final   Special Requests   Final    BOTTLES DRAWN AEROBIC AND ANAEROBIC Blood Culture results may not be optimal due to an inadequate volume of blood received in culture bottles   Culture   Final    NO GROWTH < 24 HOURS Performed at [facility], [address] [place], [place] [address]    Report Status PENDING  Incomplete  Respiratory Panel by RT PCR (Flu A&B, Covid) - Nasopharyngeal Swab     Status: None   Collection Time: 11/15/19  4:19 PM   Specimen: Nasopharyngeal Swab  Result Value Ref Range Status   SARS Coronavirus 2 by RT PCR NEGATIVE NEGATIVE Final    Comment: (NOTE) SARS-CoV-2 target nucleic acids are NOT DETECTED. The SARS-CoV-2 RNA is generally detectable in upper respiratoy specimens during the acute phase of infection. The lowest concentration of SARS-CoV-2 viral copies this assay can detect is 131 copies/mL. A negative result does not preclude SARS-Cov-2 infection and should not be used as the sole basis for treatment or other patient management decisions. A negative result may occur with  improper specimen collection/handling, submission of specimen other than nasopharyngeal swab, presence of viral mutation(s) within the areas targeted by this assay, and inadequate number of  viral copies (<131 copies/mL). A negative result must be combined with clinical observations, patient history, and epidemiological information. The expected result is Negative. Fact Sheet for Patients:  https://www.moore.com/https://www.fda.gov/media/142436/download Fact Sheet for Healthcare Providers:  https://www.young.biz/https://www.fda.gov/media/142435/download This test is not yet ap proved or cleared by the [place] FDA and  has been authorized  for detection and/or diagnosis of SARS-CoV-2 by FDA under an Emergency Use Authorization (EUA). This EUA will remain  in effect (meaning this test can be used) for the duration of the COVID-19 declaration under Section 564(b)(1) of the Act, 21 U.S.C. section 360bbb-3(b)(1), unless the authorization is terminated or revoked sooner.    Influenza A by PCR NEGATIVE NEGATIVE Final   Influenza B by PCR NEGATIVE NEGATIVE Final    Comment: (NOTE) The Xpert Xpress SARS-CoV-2/FLU/RSV assay is intended as an aid in  the diagnosis of influenza from Nasopharyngeal swab specimens and  should not be used as a sole basis for treatment. Nasal washings and  aspirates are unacceptable for Xpert Xpress SARS-CoV-2/FLU/RSV  testing. Fact Sheet for Patients: https://www.moore.com/ Fact Sheet for Healthcare Providers: https://www.young.biz/ This test is not yet approved or cleared by the [place] FDA and  has been authorized for detection and/or diagnosis of SARS-CoV-2 by  FDA under an Emergency Use Authorization (EUA). This EUA will remain  in effect (meaning this test can be used) for the duration of the  Covid-19 declaration under Section 564(b)(1) of the Act, 21  U.S.C. section 360bbb-3(b)(1), unless the authorization is  terminated or revoked. Performed at [facility], [address], [place], [place] [address]   Blood culture (routine x 2)     Status: None (Preliminary result)   Collection Time: 11/15/19  9:04 PM   Specimen: BLOOD    Result Value Ref Range Status   Specimen Description BLOOD RIGHT ANTECUBITAL  Final   Special Requests   Final    BOTTLES DRAWN AEROBIC AND ANAEROBIC Blood Culture adequate volume   Culture   Final    NO GROWTH < 12 HOURS Performed at [facility], [address] [place], [place] [address]    Report Status PENDING  Incomplete  Urine culture     Status: None   Collection Time: 11/15/19  9:30 PM   Specimen: In/Out Cath Urine  Result Value Ref Range Status   Specimen Description IN/OUT CATH URINE  Final   Special Requests NONE  Final   Culture   Final    NO GROWTH Performed at [facility], [address], [place], [place] [address]    Report Status 11/17/2019 FINAL  Final         Radiology Studies: DG Chest 2 View  Result Date: 11/15/2019 CLINICAL DATA:  Chest pain and shortness of breath EXAM: CHEST - 2 VIEW COMPARISON:  November 03, 2019 FINDINGS: There is mild left base atelectasis. The lungs elsewhere are clear. Heart size and pulmonary vascular normal. No adenopathy. No pneumothorax. There is mild degenerative change in the thoracic spine. IMPRESSION: Mild left base atelectasis. Lungs elsewhere clear. Cardiac silhouette within normal limits. Electronically Signed   By: Bretta Bang III M.D.   On: 11/15/2019 15:15   CT Angio Chest PE W and/or Wo Contrast  Result Date: 11/15/2019 CLINICAL DATA:  Shortness of breath, chest pain EXAM: CT ANGIOGRAPHY CHEST WITH CONTRAST TECHNIQUE: Multidetector CT imaging of the chest was performed using the standard protocol during bolus administration of intravenous contrast. Multiplanar CT image reconstructions and MIPs were obtained to evaluate the vascular anatomy. CONTRAST:  OMNIPAQUE IOHEXOL 350 MG/ML SOLN COMPARISON:  06/21/2012 FINDINGS: Cardiovascular: Bilateral moderate to large volume pulmonary emboli noted involving arterial branches in all lobes of both lungs. Evidence of right heart strain. RV/LV ratio 2.0. Heart  is normal size. Aorta normal caliber. Mediastinum/Nodes: No mediastinal, hilar,  or axillary adenopathy. Trachea and esophagus are unremarkable. Thyroid unremarkable. Lungs/Pleura: Left base atelectasis.  No effusions. Upper Abdomen: Imaging into the upper abdomen shows no acute findings. Musculoskeletal: Chest wall soft tissues are unremarkable. No acute bony abnormality. Review of the MIP images confirms the above findings. IMPRESSION: Positive for acute PE with CT evidence of right heart strain (RV/LV Ratio = 2.0) consistent with at least submassive (intermediate risk) PE. The presence of right heart strain has been associated with an increased risk of morbidity and mortality. Please activate Code PE by paging [phone]. Critical Value/emergent results were called by telephone at the time of interpretation on 11/15/2019 at 9:31 pm to provider [facility] , who verbally acknowledged these results. Electronically Signed   By: Charlett Nose M.D.   On: 11/15/2019 21:32   ECHOCARDIOGRAM COMPLETE  Result Date: 11/16/2019    ECHOCARDIOGRAM REPORT   Patient Name:   Karla Hoover Date of Exam: 11/16/2019 Medical Rec #:  [id number]         Height:       66.5 in Accession #:    [id number]        Weight:       187.4 lb Date of Birth:  1971-08-18         BSA:          1.96 m Patient Age:    48 years          BP:           124/101 mmHg Patient Gender: F                 HR:           133 bpm. Exam Location:  Inpatient Procedure: 2D Echo, Color Doppler and Cardiac Doppler Indications:    I26.02 Pulmonary embolus  History:        Patient has no prior history of Echocardiogram examinations.                 COPD; Risk Factors:Hypertension and Diabetes.  Sonographer:    Irving Burton Senior RDCS Referring Phys: Consepcion Hearing ANASTASSIA DOUTOVA IMPRESSIONS  1. Left ventricular ejection fraction, by estimation, is 65 to 70%. The left ventricle has normal function. The left ventrical has no regional wall motion abnormalities. There is mildly  increased left ventricular hypertrophy. Left ventricular diastolic parameters are indeterminate. Elevated left ventricular end-diastolic pressure.  2. Right ventricular systolic function is severely reduced. The right ventricular size is moderately enlarged. There is mildly elevated pulmonary artery systolic pressure. The estimated right ventricular systolic pressure is 37.2 mmHg.  3. Right atrial size was mild to moderately dilated.  4. No evidence of mitral valve regurgitation.  5. The aortic valve is normal in structure and function. Aortic valve regurgitation is not visualized. No aortic stenosis is present.  6. The inferior vena cava is normal in size with <50% respiratory variability, suggesting right atrial pressure of 8 mmHg. FINDINGS  Left Ventricle: Left ventricular ejection fraction, by estimation, is 65 to 70%. The left ventricle has normal function. The left ventricle has no regional wall motion abnormalities. The left ventricular internal cavity size was normal in size. There is  mildly increased left ventricular hypertrophy. Concentric left ventricular hypertrophy. Left ventricular diastolic parameters are indeterminate. Right Ventricle: The right ventricular size is moderately enlarged. No increase in right ventricular wall thickness. Right ventricular systolic function is severely reduced. There is mildly elevated pulmonary artery systolic pressure. The tricuspid regurgitant velocity is 2.70 m/s, and  with an assumed right atrial pressure of 8 mmHg, the estimated right ventricular systolic pressure is 37.2 mmHg. Left Atrium: Left atrial size was normal in size. Right Atrium: Right atrial size was mild to moderately dilated. Pericardium: There is no evidence of pericardial effusion. Mitral Valve: The mitral valve is normal in structure and function. Normal mobility of the mitral valve leaflets. No evidence of mitral valve regurgitation. No evidence of mitral valve stenosis. Tricuspid Valve: The  tricuspid valve is normal in structure. Tricuspid valve regurgitation is mild . No evidence of tricuspid stenosis. Aortic Valve: The aortic valve is normal in structure and function. Aortic valve regurgitation is not visualized. No aortic stenosis is present. Pulmonic Valve: The pulmonic valve was normal in structure. Pulmonic valve regurgitation is not visualized. No evidence of pulmonic stenosis. Aorta: The aortic root is normal in size and structure. Venous: The inferior vena cava is normal in size with less than 50% respiratory variability, suggesting right atrial pressure of 8 mmHg. The inferior vena cava and the hepatic vein show a normal flow pattern. IAS/Shunts: No atrial level shunt detected by color flow Doppler.  LEFT VENTRICLE PLAX 2D LVIDd:         3.17 cm  Diastology LVIDs:         2.24 cm  LV e' lateral:   2.61 cm/s LV PW:         1.28 cm  LV E/e' lateral: 27.7 LV IVS:        1.25 cm  LV e' medial:    3.59 cm/s LVOT diam:     1.90 cm  LV E/e' medial:  20.1 LV SV:         20.41 ml LV SV Index:   11.42 LVOT Area:     2.84 cm  RIGHT VENTRICLE RV S prime:     4.90 cm/s TAPSE (M-mode): 0.9 cm LEFT ATRIUM             Index       RIGHT ATRIUM           Index LA diam:        2.50 cm 1.28 cm/m  RA Area:     17.00 cm LA Vol (A2C):   36.3 ml 18.56 ml/m RA Volume:   53.00 ml  27.09 ml/m LA Vol (A4C):   17.6 ml 9.00 ml/m LA Biplane Vol: 25.4 ml 12.98 ml/m  AORTIC VALVE LVOT Vmax:   56.90 cm/s LVOT Vmean:  39.500 cm/s LVOT VTI:    0.072 m  AORTA Ao Root diam: 2.70 cm Ao Asc diam:  2.90 cm MITRAL VALVE                        TRICUSPID VALVE MV Area (PHT): 8.43 cm             TR Peak grad:   29.2 mmHg MV Decel Time: 90 msec              TR Vmax:        270.00 cm/s MV E velocity: 72.30 cm/s 103 cm/s MV A velocity: 46.60 cm/s 70.3 cm/s SHUNTS MV E/A ratio:  1.55       1.5       Systemic VTI:  0.07 m  Systemic Diam: 1.90 cm Fransico Him MD Electronically signed by Fransico Him  MD Signature Date/Time: 11/16/2019/12:17:23 PM    Final    VAS [place] LOWER EXTREMITY VENOUS (DVT)  Result Date: 11/16/2019  Lower Venous DVTStudy Indications: Pulmonary embolism.  Comparison Study: no prior Performing Technologist: Abram Sander RVS  Examination Guidelines: A complete evaluation includes B-mode imaging, spectral Doppler, color Doppler, and power Doppler as needed of all accessible portions of each vessel. Bilateral testing is considered an integral part of a complete examination. Limited examinations for reoccurring indications may be performed as noted. The reflux portion of the exam is performed with the patient in reverse Trendelenburg.  +---------+---------------+---------+-----------+----------+--------------+ RIGHT    CompressibilityPhasicitySpontaneityPropertiesThrombus Aging +---------+---------------+---------+-----------+----------+--------------+ CFV      Full           Yes      Yes                                 +---------+---------------+---------+-----------+----------+--------------+ SFJ      Full                                                        +---------+---------------+---------+-----------+----------+--------------+ FV Prox  Full                                                        +---------+---------------+---------+-----------+----------+--------------+ FV Mid   Full                                                        +---------+---------------+---------+-----------+----------+--------------+ FV DistalFull                                                        +---------+---------------+---------+-----------+----------+--------------+ PFV      Full                                                        +---------+---------------+---------+-----------+----------+--------------+ POP      Full           Yes      Yes                                  +---------+---------------+---------+-----------+----------+--------------+ PTV      Full                                                        +---------+---------------+---------+-----------+----------+--------------+  PERO     Full                                                        +---------+---------------+---------+-----------+----------+--------------+   +---------+---------------+---------+-----------+----------+--------------+ LEFT     CompressibilityPhasicitySpontaneityPropertiesThrombus Aging +---------+---------------+---------+-----------+----------+--------------+ CFV      Full           Yes      Yes                                 +---------+---------------+---------+-----------+----------+--------------+ SFJ      Full                                                        +---------+---------------+---------+-----------+----------+--------------+ FV Prox  None                                         Acute          +---------+---------------+---------+-----------+----------+--------------+ FV Mid   None                                         Acute          +---------+---------------+---------+-----------+----------+--------------+ FV DistalNone                                         Acute          +---------+---------------+---------+-----------+----------+--------------+ PFV      Full                                                        +---------+---------------+---------+-----------+----------+--------------+ POP      Full           Yes      Yes                                 +---------+---------------+---------+-----------+----------+--------------+ PTV      Full                                                        +---------+---------------+---------+-----------+----------+--------------+ PERO     Full                                                         +---------+---------------+---------+-----------+----------+--------------+  Summary: RIGHT: - There is no evidence of deep vein thrombosis in the lower extremity.  - No cystic structure found in the popliteal fossa.  LEFT: - Findings consistent with acute deep vein thrombosis involving the left femoral vein. - No cystic structure found in the popliteal fossa.  *See table(s) above for measurements and observations. Electronically signed by Waverly Ferrari MD on 11/16/2019 at 12:14:25 PM.    Final         Scheduled Meds: . arformoterol  15 mcg Nebulization BID  . atorvastatin  80 mg Oral Daily  . budesonide (PULMICORT) nebulizer solution  0.5 mg Nebulization Q12H  . buPROPion  100 mg Oral BID  . Chlorhexidine Gluconate Cloth  6 each Topical Daily  . clopidogrel  75 mg Oral Daily  . insulin aspart  0-15 Units Subcutaneous Q4H  . insulin glargine  15 Units Subcutaneous Daily  . ipratropium  0.5 mg Nebulization Q6H  . levothyroxine  50 mcg Oral Daily  . nicotine  21 mg Transdermal Daily  . sodium chloride flush  3 mL Intravenous Once   Continuous Infusions: . ceFEPime (MAXIPIME) IV 2 g (11/17/19 0552)  . heparin 1,400 Units/hr (11/17/19 0549)     LOS: 1 day    Time spent: 30 minutes    Dorcas Carrow, MD [facility] Pager [phone]

## 2019-11-17 NOTE — Progress Notes (Signed)
NAME:  Karla Hoover, MRN:  [id number], DOB:  November 07, 1970, LOS: 1 ADMISSION DATE:  11/15/2019, CONSULTATION DATE:  11/15/19 REFERRING MD:  Langston Masker - EM, CHIEF COMPLAINT:  Chest pain  Brief History   49 yo F PMH COPD, DM, HTN, prior CVA who presented to ED with CC chest pain. Patient reports that chest pain began after a hall on 11/03/19, for which she previously presented to ED and was dx with musculoskeletal strain. Patient evidently sustained second fall today, PTA, while pivoting to bathroom. Patient fell, landing on L side and feeling residual soreness and pain on L side of body as well as chest.   In ED, patient initial SBP 90, prompting code sepsis initiation, receiving 2L IVF and initiation of vnac, cefepime, flagyl. Subsequently HDS. Further workup in ED for chest pain revealed bilateral PE on CTA chest and patient was started on heparin per pharmacy.   Past Medical History   Past Medical History:  Diagnosis Date  . Asthma   . COPD (chronic obstructive pulmonary disease) ([place])   . Diabetes mellitus without complication ([place])   . Hypertension   . Pulmonary embolism ([place]) 11/16/2019  . Stroke [facility])     Significant Hospital Events   11/15/19 presents to ED with Chest pain, found to have PE   Consults:  PCCM   Procedures:    Significant Diagnostic Tests:  CTA chest 2/8> bilateral pulmonary emboli involving arterial branches in all lobes of both lungs. RV/LV 2.0 LE duplex 2/9 > LFV DVT. Echo 2/9 > LVEF 65-70%.  RV systolic function severely reduced, RV mod enlarged.  Micro Data:  SARS Cov2 2/8> neg  Flu A/B 2/8 > neg BCx 2/8>   Antimicrobials:  Cefepime 2/8 Flagyl 2/8 Vanc 2/8   Interim history/subjective:  States that she feels "fine".  Breathing comfortably.  Remains tachycardic in low 100's.   Objective   Blood pressure 114/86, pulse (!) 128, temperature 99.3 F (37.4 C), temperature source Oral, resp. rate 20, height 5' 6.5" (1.689 m), weight 85 kg, last  menstrual period 10/20/2019, SpO2 95 %.        Intake/Output Summary (Last 24 hours) at 11/17/2019 0818 Last data filed at 11/17/2019 0300 Gross per 24 hour  Intake 742.14 ml  Output 580 ml  Net 162.14 ml   Filed Weights   11/15/19 1832  Weight: 85 kg   Physical Exam: General: Adult female, resting in bed, in NAD. Neuro: A&O x 3, no deficits. HEENT: [place]/AT. Sclerae anicteric. EOMI. Cardiovascular: Tachy, regular, no M/R/G.  Lungs: Respirations even and unlabored.  CTA bilaterally, No W/R/R. Abdomen: BS x 4, soft, NT/ND.  Musculoskeletal: No gross deformities, no edema.  Skin: Intact, warm, no rashes.  Assessment & Plan:   Bilateral Pulmonary Embolism, submassive. Acute hypoxemic resp failure due to above - Continue heparin gtt. - Initially did have some concern would require EKOS but she has tolerated things quite well; therefore, will hold off (some increased risk bleeding with Plavix). - Continue o2  - for pulse Ox > 88%.  COPD - home symbicort, albuterol, trelegy. Prescribed 2-3LNC, but is non-compliant as O2 is not at present residence.  - Supplemental O2 as above. - Continue BD's (budesonide / brovana and atrovent in lieu of home symbicort,  Trelegy).  Rest per primary team.   Best practice:  Diet: recommend heart healthy  Pain/Anxiety/Delirium protocol (if indicated): no acute regimen  VAP protocol (if indicated): na DVT prophylaxis: heparin gtt  GI prophylaxis: na Glucose control:  SSI  Mobility: BR Code Status: Full  Family Communication: Per primary Disposition: SDU per [facility]   Rutherford Guys, PA [facility] 11/17/2019, 8:36 AM

## 2019-11-17 NOTE — Progress Notes (Signed)
CRITICAL VALUE STICKER  CRITICAL VALUE: Lactic Acid 2.9  RECEIVER (on-site recipient of call): Rolland Bimler, RN  DATE & TIME NOTIFIED: 09/15/2020 @0020   MESSENGER (representative from lab):   MD NOTIFIED: MD   TIME OF NOTIFICATION: 0058  RESPONSE:

## 2019-11-17 NOTE — Progress Notes (Signed)
Karla Hoover requested that her mother, Karla Hoover is the only person who can have health information about her.   Mrs. Neale Hoover would like to receive a call with an update on her daughter.  She also requests that the social workers contact her with SNIF placements.

## 2019-11-17 NOTE — Progress Notes (Addendum)
CONSULT NOTE   Pharmacy Consult for Heparin, cefepime Indication: pulmonary embolus  Allergies  Allergen Reactions  . Guaifenesin Anaphylaxis  . Levaquin [Levofloxacin In D5w] Shortness Of Breath and Rash  . Strawberry Extract Anaphylaxis  . Kiwi Extract Other (See Comments)    Other reaction(s): Other (See Comments)     Patient Measurements: Height: 5' 6.5" (168.9 cm) Weight: 187 lb 6.3 oz (85 kg) IBW/kg (Calculated) : 60.45 Heparin Dosing Weight: 78.4kg  Vital Signs: Temp: 98.1 F (36.7 C) (02/10 1155) Temp Source: Oral (02/10 1155) BP: 106/72 (02/10 1155) Pulse Rate: 123 (02/10 1155)  Labs: Recent Labs    11/15/19 1800 11/15/19 2306 11/16/19 0306 11/16/19 0306 11/16/19 0515 11/16/19 0840 11/16/19 1208 11/16/19 1449 11/17/19 0343 11/17/19 1038  HGB  --   --  15.4*   < >  --   --   --  14.4 14.1  --   HCT  --   --  46.2*  --   --   --   --  43.0 42.0  --   PLT  --   --  331  --   --   --   --  257 229  --   APTT 30  --   --   --   --   --   --   --   --   --   LABPROT 13.7  --   --   --   --   --   --   --   --   --   INR 1.1  --   --   --   --   --   --   --   --   --   HEPARINUNFRC  --   --  0.83*   < >  --   --  0.49  --  0.27* 0.23*  CREATININE  --   --  1.52*  --   --   --   --  1.34* 1.24*  --   TROPONINIHS 83*   < > 278*  --    < > 289* 274* 235*  --   --    < > = values in this interval not displayed.    Estimated Creatinine Clearance: 62.2 mL/min (A) (by C-G formula based on SCr of 1.24 mg/dL (H)).   Medications:  Scheduled:  . arformoterol  15 mcg Nebulization BID  . atorvastatin  80 mg Oral Daily  . budesonide (PULMICORT) nebulizer solution  0.5 mg Nebulization Q12H  . buPROPion  100 mg Oral BID  . Chlorhexidine Gluconate Cloth  6 each Topical Daily  . clopidogrel  75 mg Oral Daily  . insulin aspart  0-15 Units Subcutaneous Q4H  . insulin glargine  15 Units Subcutaneous Daily  . ipratropium  0.5 mg Nebulization Q6H  . levothyroxine  50  mcg Oral Daily  . nicotine  21 mg Transdermal Daily  . sodium chloride flush  3 mL Intravenous Once    Assessment: Patient is a 62 yof that presents to the ED with complaints of chest pain. The patient was found to have a large PE with evidence of right heart strain (elevated trop) and also noted with duplex positive for LLE DVT . Pharmacy has been asked to dose heparin. Plans noted for catheter directed thrombolysis today -heparin level is 0.23 on 1400 units/hr   She is also on cefepime (day 3) for concern of sepsis.   -WBC= 15, afebrile, PCT=  0.24, SCr= 1.24, CrCl ~ 60, cultures- ngtd   Goal of Therapy:  Heparin level 0.3-0.7 units/ml Monitor platelets by anticoagulation protocol: Yes   Plan: -heparin bolus 1000 units x1 then Increase heparin to 1600 units/hr (no bolus due to planned procedure) -Heparin level in 6 hours and daily wth CBC daily -Continue cefepime IV q12h -Will follow renal function, cultures and clinical progress   Hildred Laser, PharmD Clinical Pharmacist **Pharmacist phone directory can now be found on [email or web address] ([email or web address]).  Listed under [place].

## 2019-11-17 NOTE — Progress Notes (Signed)
ANTICOAGULATION CONSULT NOTE   Pharmacy Consult for Heparin Indication: pulmonary embolus  Allergies  Allergen Reactions  . Guaifenesin Anaphylaxis  . Levaquin [Levofloxacin In D5w] Shortness Of Breath and Rash  . Strawberry Extract Anaphylaxis  . Kiwi Extract Other (See Comments)    Other reaction(s): Other (See Comments)     Patient Measurements: Height: 5' 6.5" (168.9 cm) Weight: 187 lb 6.3 oz (85 kg) IBW/kg (Calculated) : 60.45 Heparin Dosing Weight: 78.4kg  Vital Signs: Temp: 97.8 F (36.6 C) (02/10 0414) Temp Source: Oral (02/10 0414) BP: 96/84 (02/10 0414) Pulse Rate: 125 (02/10 0414)  Labs: Recent Labs    11/15/19 1800 11/15/19 2306 11/16/19 0306 11/16/19 0306 11/16/19 0515 11/16/19 0840 11/16/19 1208 11/16/19 1449 11/17/19 0343  HGB  --   --  15.4*   < >  --   --   --  14.4 14.1  HCT  --   --  46.2*  --   --   --   --  43.0 42.0  PLT  --   --  331  --   --   --   --  257 229  APTT 30  --   --   --   --   --   --   --   --   LABPROT 13.7  --   --   --   --   --   --   --   --   INR 1.1  --   --   --   --   --   --   --   --   HEPARINUNFRC  --   --  0.83*  --   --   --  0.49  --  0.27*  CREATININE  --   --  1.52*  --   --   --   --  1.34* 1.24*  TROPONINIHS 83*   < > 278*  --    < > 289* 274* 235*  --    < > = values in this interval not displayed.    Estimated Creatinine Clearance: 62.2 mL/min (A) (by C-G formula based on SCr of 1.24 mg/dL (H)).   Medications:  Scheduled:  . atorvastatin  80 mg Oral Daily  . budesonide (PULMICORT) nebulizer solution  0.25 mg Nebulization Q12H  . buPROPion  100 mg Oral BID  . Chlorhexidine Gluconate Cloth  6 each Topical Daily  . clopidogrel  75 mg Oral Daily  . fluticasone furoate-vilanterol  1 puff Inhalation Daily  . insulin aspart  0-15 Units Subcutaneous Q4H  . insulin glargine  15 Units Subcutaneous Daily  . levothyroxine  50 mcg Oral Daily  . mometasone-formoterol  2 puff Inhalation BID  . nicotine  21  mg Transdermal Daily  . sodium chloride flush  3 mL Intravenous Once    Assessment: Patient is a 35 yof that presents to the ED with complaints of chest pain. The patient was found to have a large PE with evidence of right heart strain (elevated trop) and also noted with duplex positive for LLE DVT . Pharmacy has been asked to dose heparin.   Heparin level slightly subtherapeutic (0.27) on gtt at 1250 units/hr. No issues with line or bleeding reported per RN.  Goal of Therapy:  Heparin level 0.3-0.7 units/ml Monitor platelets by anticoagulation protocol: Yes   Plan: -Increase heparin to 1450 units/hr -Will f/u 6 hr heparin level  Christoper Fabian, PharmD, BCPS Please see amion for complete  clinical pharmacist phone list 11/17/2019 4:40 AM

## 2019-11-17 NOTE — H&P (Addendum)
Chief Complaint: Pulmonary embolism  Referring Physician(s): Barb Merino  Supervising Physician: Corrie Mckusick  Patient Status: [facility] - In-pt  History of Present Illness: Karla Hoover is a 49 y.o. female with medical issues including COPD, diabetes, hypertension and history of CVA with left sided weakness.  She apparently fell out of her wheelchair last week and hit her chest.  She has had persistent chest pain since that time.  She came to the ED because she felt her pain was getting worse and more centralized toward the epigastrium.  CTA done showed= Positive for acute PE with CT evidence of right heart strain (RV/LV Ratio = 2.0) consistent with at least submassive (intermediate risk) PE. The presence of right heart strain has been associated with an increased risk of morbidity and mortality.  She was prescribed home O2 for her COPD, however she is non-compliant.  She continues to smoke.   Per medicine list she takes Plavix, presumably for her history of Stroke which was in August.  Troponin is 235.  ECHO showed: Right ventricular systolic function is severely reduced. The right ventricular size is moderately enlarged. There is mildly elevated pulmonary artery systolic pressure. The estimated right ventricular systolic pressure is 32.7 mmHg.  She is tachycardic at  120 bpm.  She denies any bleeding issues, no GI bleeding, no broken bones, no recent surgery, not high risk for bleeding with tPA.  Past Medical History:  Diagnosis Date  . Asthma   . COPD (chronic obstructive pulmonary disease) ([place])   . Diabetes mellitus without complication ([place])   . Hypertension   . Pulmonary embolism ([place]) 11/16/2019  . Stroke [facility])     History reviewed. No pertinent surgical history.  Allergies: Guaifenesin, Levaquin [levofloxacin in d5w], Strawberry extract, and Kiwi extract  Medications: Prior to Admission medications   Medication Sig Start Date End Date  Taking? Authorizing Provider  acetaminophen (TYLENOL) 325 MG tablet Take 325 mg by mouth every 6 (six) hours as needed for pain. 07/23/19 08/23/20  [provider]  albuterol (VENTOLIN HFA) 108 (90 Base) MCG/ACT inhaler Inhale 2 puffs into the lungs every 6 (six) hours as needed for wheezing or shortness of breath. 10/04/19   Kerin Perna, NP  ALPRAZolam Duanne Moron) 0.5 MG tablet Take 0.5 mg by mouth every 8 (eight) hours as needed for anxiety.    [provider]  amLODipine-benazepril (LOTREL) 10-40 MG capsule Take 1 capsule by mouth daily. 10/04/19   Kerin Perna, NP  atorvastatin (LIPITOR) 80 MG tablet Take 1 tablet (80 mg total) by mouth daily. 10/04/19   Kerin Perna, NP  budesonide-formoterol (SYMBICORT) 160-4.5 MCG/ACT inhaler Inhale 2 puffs into the lungs 2 (two) times daily. 10/04/19   Kerin Perna, NP  buPROPion (WELLBUTRIN SR) 100 MG 12 hr tablet Take 1 tablet (100 mg total) by mouth 2 (two) times daily. 10/04/19   Kerin Perna, NP  clopidogrel (PLAVIX) 75 MG tablet Take 1 tablet (75 mg total) by mouth daily. 10/04/19   Kerin Perna, NP  cyclobenzaprine (FLEXERIL) 10 MG tablet Take 1 tablet (10 mg total) by mouth 3 (three) times daily as needed for muscle spasms. 10/04/19   Kerin Perna, NP  diclofenac (VOLTAREN) 50 MG EC tablet Take 1 tablet (50 mg total) by mouth every 8 (eight) hours as needed for mild pain. 09/09/19   Jacqlyn Larsen, PA-C  docusate sodium (COLACE) 100 MG capsule Take 100 mg by mouth 2 (two) times daily  as needed for constipation. 08/23/19   [provider]  Fluticasone-Umeclidin-Vilant (TRELEGY ELLIPTA) 100-62.5-25 MCG/INH AEPB Inhale 1 puff into the lungs daily. 10/04/19   Kerin Perna, NP  gabapentin (NEURONTIN) 300 MG capsule Take 1 capsule (300 mg total) by mouth 3 (three) times daily. 11/11/19 12/11/19  Kerin Perna, NP  HYDROcodone-acetaminophen (NORCO) 7.5-325 MG tablet Take 1 tablet  by mouth every 6 (six) hours as needed for pain. 08/23/19   [provider]  ibuprofen (ADVIL) 800 MG tablet Take 1 tablet (800 mg total) by mouth every 8 (eight) hours as needed. 10/04/19   Kerin Perna, NP  Insulin Glargine (BASAGLAR KWIKPEN) 100 UNIT/ML SOPN Inject 0.15 mLs (15 Units total) into the skin daily. 10/04/19   Kerin Perna, NP  insulin lispro (HUMALOG KWIKPEN) 100 UNIT/ML KwikPen Inject 0.01-0.05 mLs (1-5 Units total) into the skin 3 (three) times daily as needed. PRN BLOOD SUGAR 151-200 1 unit 201-250 2 units 251-300 3 units 301-350 4 units 351-399 5 units Over 400 call MD 10/04/19   Charlott Rakes, MD  Insulin Pen Needle (PEN NEEDLES) 32G X 4 MM MISC 1 each by Does not apply route as needed. 10/04/19   Kerin Perna, NP  ipratropium-albuterol (DUONEB) 0.5-2.5 (3) MG/3ML SOLN Inhale 3 mLs into the lungs 4 (four) times daily as needed. 10/04/19   Kerin Perna, NP  levothyroxine (SYNTHROID) 50 MCG tablet Take 1 tablet (50 mcg total) by mouth daily. 10/07/19   Kerin Perna, NP  lidocaine (LIDODERM) 5 % Place 1 patch onto the skin every 12 (twelve) hours as needed. 09/09/19   Jacqlyn Larsen, PA-C  methylphenidate (RITALIN) 5 MG tablet Take 5 mg by mouth daily.     [provider]  metoprolol tartrate (LOPRESSOR) 25 MG tablet Take 1 tablet (25 mg total) by mouth 2 (two) times daily. 10/04/19   Kerin Perna, NP  mirtazapine (REMERON) 45 MG tablet Take 1 tablet (45 mg total) by mouth at bedtime. 10/04/19   Kerin Perna, NP  naloxone [facility]) 4 MG/0.1ML LIQD nasal spray kit Place 1 spray into the nose once as needed. overdose 06/03/19   [provider]  nicotine (NICODERM CQ - DOSED IN MG/24 HOURS) 21 mg/24hr patch Place 21 mg onto the skin as needed. Smoking sensation 03/12/18   [provider]  NIFEdipine (PROCARDIA-XL/NIFEDICAL-XL) 30 MG 24 hr tablet Take 1 tablet (30 mg total) by mouth daily. 10/04/19   Kerin Perna, NP  polyethylene glycol (MIRALAX / GLYCOLAX) 17 g packet Take 17 g by mouth 2 (two) times daily as needed. 09/09/19   Jacqlyn Larsen, PA-C  tetrahydrozoline 0.05 % ophthalmic solution Place 1 drop into both eyes 4 (four) times daily as needed. Eye irritation 07/23/19   [provider]     Family History  Problem Relation Age of Onset  . Diabetes Mother   . COPD Sister   . Heart failure Sister     Social History   Socioeconomic History  . Marital status: Single    Spouse name: Not on file  . Number of children: Not on file  . Years of education: Not on file  . Highest education level: Not on file  Occupational History  . Not on file  Tobacco Use  . Smoking status: Current Every Day Smoker    Packs/day: 0.50    Types: Cigarettes  . Smokeless tobacco: Never Used  Substance and Sexual Activity  . Alcohol  use: No  . Drug use: No  . Sexual activity: Not on file  Other Topics Concern  . Not on file  Social History Narrative  . Not on file   Social Determinants of Health   Financial Resource Strain:   . Difficulty of Paying Living Expenses: Not on file  Food Insecurity:   . Worried About Charity fundraiser in the Last Year: Not on file  . Ran Out of Food in the Last Year: Not on file  Transportation Needs:   . Lack of Transportation (Medical): Not on file  . Lack of Transportation (Non-Medical): Not on file  Physical Activity:   . Days of Exercise per Week: Not on file  . Minutes of Exercise per Session: Not on file  Stress:   . Feeling of Stress : Not on file  Social Connections:   . Frequency of Communication with Friends and Family: Not on file  . Frequency of Social Gatherings with Friends and Family: Not on file  . Attends Religious Services: Not on file  . Active Member of Clubs or Organizations: Not on file  . Attends Archivist Meetings: Not on file  . Marital Status: Not on file     Review of Systems: A 12 point ROS  discussed and pertinent positives are indicated in the HPI above.  All other systems are negative.  Review of Systems  Vital Signs: BP 114/86 (BP Location: Left Arm)   Pulse (!) 126   Temp 99.3 F (37.4 C) (Oral)   Resp 20   Ht 5' 6.5" (1.689 m)   Wt 85 kg   LMP 10/20/2019   SpO2 96%   BMI 29.79 kg/m   Physical Exam Vitals reviewed.  Constitutional:      Appearance: Normal appearance.  HENT:     Head: Normocephalic and atraumatic.  Eyes:     Extraocular Movements: Extraocular movements intact.  Cardiovascular:     Rate and Rhythm: Normal rate and regular rhythm.  Pulmonary:     Effort: Pulmonary effort is normal. No respiratory distress.     Breath sounds: Normal breath sounds.  Abdominal:     General: There is no distension.     Palpations: Abdomen is soft.     Tenderness: There is no abdominal tenderness.  Musculoskeletal:        General: Normal range of motion.     Comments: Flaccid left arm. No visible ecchymosis related to fall.  Skin:    General: Skin is warm and dry.  Neurological:     General: No focal deficit present.     Mental Status: She is alert and oriented to person, place, and time.  Psychiatric:        Mood and Affect: Mood normal.        Behavior: Behavior normal.        Thought Content: Thought content normal.        Judgment: Judgment normal.     Imaging: DG Chest 2 View  Result Date: 11/15/2019 CLINICAL DATA:  Chest pain and shortness of breath EXAM: CHEST - 2 VIEW COMPARISON:  November 03, 2019 FINDINGS: There is mild left base atelectasis. The lungs elsewhere are clear. Heart size and pulmonary vascular normal. No adenopathy. No pneumothorax. There is mild degenerative change in the thoracic spine. IMPRESSION: Mild left base atelectasis. Lungs elsewhere clear. Cardiac silhouette within normal limits. Electronically Signed   By: Lowella Grip III M.D.   On: 11/15/2019 15:15  DG Ribs Unilateral W/Chest Left  Result Date:  11/03/2019 CLINICAL DATA:  Left-sided chest pain after fall. EXAM: LEFT RIBS AND CHEST - 3+ VIEW COMPARISON:  Chest x-ray dated Feb 27, 2015. FINDINGS: No fracture or other bone lesions are seen involving the ribs. There is no evidence of pneumothorax or pleural effusion. Both lungs are clear. Heart size and mediastinal contours are within normal limits. IMPRESSION: Negative. Electronically Signed   By: Titus Dubin M.D.   On: 11/03/2019 18:08   DG Thoracic Spine 2 View  Result Date: 11/03/2019 CLINICAL DATA:  Back pain after ground level fall. EXAM: THORACIC SPINE 2 VIEWS COMPARISON:  Chest x-ray dated Feb 28, 2015. FINDINGS: Twelve rib-bearing thoracic vertebral bodies. No acute fracture or subluxation. Vertebral body heights are preserved. Alignment is normal. Mild degenerative disc disease in the mid to lower thoracic spine. IMPRESSION: No acute osseous abnormality. Electronically Signed   By: Titus Dubin M.D.   On: 11/03/2019 18:16   DG Forearm Left  Result Date: 11/03/2019 CLINICAL DATA:  Left forearm pain after ground level fall. EXAM: LEFT FOREARM - 2 VIEW COMPARISON:  None. FINDINGS: There is no evidence of fracture or other focal bone lesions. Soft tissues are unremarkable. IMPRESSION: Negative. Electronically Signed   By: Titus Dubin M.D.   On: 11/03/2019 18:12   DG Knee 2 Views Left  Result Date: 11/03/2019 CLINICAL DATA:  Left knee pain after ground level fall. EXAM: LEFT KNEE - 1-2 VIEW COMPARISON:  None. FINDINGS: No evidence of fracture, dislocation, or joint effusion. No evidence of arthropathy or other focal bone abnormality. Soft tissues are unremarkable. IMPRESSION: Negative. Electronically Signed   By: Titus Dubin M.D.   On: 11/03/2019 18:14   DG Ankle Complete Left  Result Date: 11/03/2019 CLINICAL DATA:  Left ankle pain after ground level fall. EXAM: LEFT ANKLE COMPLETE - 3+ VIEW COMPARISON:  None. FINDINGS: No acute fracture or dislocation. The ankle mortise is  symmetric. The talar dome is intact. No tibiotalar joint effusion. Joint spaces are preserved. Bone mineralization is normal. Small Achilles enthesophyte. Mild soft tissue swelling medially. IMPRESSION: No acute osseous abnormality. Electronically Signed   By: Titus Dubin M.D.   On: 11/03/2019 18:14   CT Head Wo Contrast  Result Date: 11/03/2019 CLINICAL DATA:  Fall. Neck pain. History of prior stroke with left hemiparesis. EXAM: CT HEAD WITHOUT CONTRAST CT CERVICAL SPINE WITHOUT CONTRAST TECHNIQUE: Multidetector CT imaging of the head and cervical spine was performed following the standard protocol without intravenous contrast. Multiplanar CT image reconstructions of the cervical spine were also generated. COMPARISON:  CT neck dated June 21, 2012. CT head dated April 27, 2011. FINDINGS: CT HEAD FINDINGS Brain: No evidence of acute infarction, hemorrhage, hydrocephalus, extra-axial collection or mass lesion/mass effect. Chronic infarct in the right basal ganglia and external capsule. Vascular: Atherosclerotic vascular calcification of the carotid siphons. No hyperdense vessel. Skull: Normal. Negative for fracture or focal lesion. Sinuses/Orbits: No acute finding. Other: None. CT CERVICAL SPINE FINDINGS Alignment: No traumatic malalignment. Skull base and vertebrae: No acute fracture. No primary bone lesion or focal pathologic process. Soft tissues and spinal canal: No prevertebral fluid or swelling. No visible canal hematoma. Disc levels: Disc heights are relatively preserved. Early uncovertebral hypertrophy at C3-C4 and C4-C5. Upper chest: Mild centrilobular emphysema. Other: Mildly enlarged heterogeneous thyroid gland without discrete nodule. IMPRESSION: 1. No acute intracranial abnormality. Chronic infarct in the right basal ganglia and external capsule. 2. No acute cervical spine fracture. Electronically Signed  By: Titus Dubin M.D.   On: 11/03/2019 18:06   CT Angio Chest PE W and/or Wo  Contrast  Result Date: 11/15/2019 CLINICAL DATA:  Shortness of breath, chest pain EXAM: CT ANGIOGRAPHY CHEST WITH CONTRAST TECHNIQUE: Multidetector CT imaging of the chest was performed using the standard protocol during bolus administration of intravenous contrast. Multiplanar CT image reconstructions and MIPs were obtained to evaluate the vascular anatomy. CONTRAST:  114m OMNIPAQUE IOHEXOL 350 MG/ML SOLN COMPARISON:  06/21/2012 FINDINGS: Cardiovascular: Bilateral moderate to large volume pulmonary emboli noted involving arterial branches in all lobes of both lungs. Evidence of right heart strain. RV/LV ratio 2.0. Heart is normal size. Aorta normal caliber. Mediastinum/Nodes: No mediastinal, hilar, or axillary adenopathy. Trachea and esophagus are unremarkable. Thyroid unremarkable. Lungs/Pleura: Left base atelectasis.  No effusions. Upper Abdomen: Imaging into the upper abdomen shows no acute findings. Musculoskeletal: Chest wall soft tissues are unremarkable. No acute bony abnormality. Review of the MIP images confirms the above findings. IMPRESSION: Positive for acute PE with CT evidence of right heart strain (RV/LV Ratio = 2.0) consistent with at least submassive (intermediate risk) PE. The presence of right heart strain has been associated with an increased risk of morbidity and mortality. Please activate Code PE by paging [phone] Critical Value/emergent results were called by telephone at the time of interpretation on 11/15/2019 at 9:31 pm [facility], who verbally acknowledged these results. Electronically Signed   By: KRolm BaptiseM.D.   On: 11/15/2019 21:32   CT Cervical Spine Wo Contrast  Result Date: 11/03/2019 CLINICAL DATA:  Fall. Neck pain. History of prior stroke with left hemiparesis. EXAM: CT HEAD WITHOUT CONTRAST CT CERVICAL SPINE WITHOUT CONTRAST TECHNIQUE: Multidetector CT imaging of the head and cervical spine was performed following the standard protocol without  intravenous contrast. Multiplanar CT image reconstructions of the cervical spine were also generated. COMPARISON:  CT neck dated June 21, 2012. CT head dated April 27, 2011. FINDINGS: CT HEAD FINDINGS Brain: No evidence of acute infarction, hemorrhage, hydrocephalus, extra-axial collection or mass lesion/mass effect. Chronic infarct in the right basal ganglia and external capsule. Vascular: Atherosclerotic vascular calcification of the carotid siphons. No hyperdense vessel. Skull: Normal. Negative for fracture or focal lesion. Sinuses/Orbits: No acute finding. Other: None. CT CERVICAL SPINE FINDINGS Alignment: No traumatic malalignment. Skull base and vertebrae: No acute fracture. No primary bone lesion or focal pathologic process. Soft tissues and spinal canal: No prevertebral fluid or swelling. No visible canal hematoma. Disc levels: Disc heights are relatively preserved. Early uncovertebral hypertrophy at C3-C4 and C4-C5. Upper chest: Mild centrilobular emphysema. Other: Mildly enlarged heterogeneous thyroid gland without discrete nodule. IMPRESSION: 1. No acute intracranial abnormality. Chronic infarct in the right basal ganglia and external capsule. 2. No acute cervical spine fracture. Electronically Signed   By: WTitus DubinM.D.   On: 11/03/2019 18:06   DG Humerus Left  Result Date: 11/03/2019 CLINICAL DATA:  Left upper arm pain after ground level fall. EXAM: LEFT HUMERUS - 2+ VIEW COMPARISON:  None. FINDINGS: There is no evidence of fracture or other focal bone lesions. Soft tissues are unremarkable. IMPRESSION: Negative. Electronically Signed   By: WTitus DubinM.D.   On: 11/03/2019 18:12   ECHOCARDIOGRAM COMPLETE  Result Date: 11/16/2019    ECHOCARDIOGRAM REPORT   Patient Name:   Karla MOLCHANDate of Exam: 11/16/2019 Medical Rec #:  [id number]        Height:       66.5 in Accession #:  [id number]        Weight:       187.4 lb Date of Birth:  11-Dec-1970         BSA:          1.96 m  Patient Age:    53 years          BP:           124/101 mmHg Patient Gender: F                 HR:           133 bpm. Exam Location:  Inpatient Procedure: 2D Echo, Color Doppler and Cardiac Doppler Indications:    I26.02 Pulmonary embolus  History:        Patient has no prior history of Echocardiogram examinations.                 COPD; Risk Factors:Hypertension and Diabetes.  Sonographer:    Raquel Sarna Senior RDCS Referring Phys: [place]  1. Left ventricular ejection fraction, by estimation, is 65 to 70%. The left ventricle has normal function. The left ventrical has no regional wall motion abnormalities. There is mildly increased left ventricular hypertrophy. Left ventricular diastolic parameters are indeterminate. Elevated left ventricular end-diastolic pressure.  2. Right ventricular systolic function is severely reduced. The right ventricular size is moderately enlarged. There is mildly elevated pulmonary artery systolic pressure. The estimated right ventricular systolic pressure is 79.0 mmHg.  3. Right atrial size was mild to moderately dilated.  4. No evidence of mitral valve regurgitation.  5. The aortic valve is normal in structure and function. Aortic valve regurgitation is not visualized. No aortic stenosis is present.  6. The inferior vena cava is normal in size with <50% respiratory variability, suggesting right atrial pressure of 8 mmHg. FINDINGS  Left Ventricle: Left ventricular ejection fraction, by estimation, is 65 to 70%. The left ventricle has normal function. The left ventricle has no regional wall motion abnormalities. The left ventricular internal cavity size was normal in size. There is  mildly increased left ventricular hypertrophy. Concentric left ventricular hypertrophy. Left ventricular diastolic parameters are indeterminate. Right Ventricle: The right ventricular size is moderately enlarged. No increase in right ventricular wall thickness. Right ventricular  systolic function is severely reduced. There is mildly elevated pulmonary artery systolic pressure. The tricuspid regurgitant velocity is 2.70 m/s, and with an assumed right atrial pressure of 8 mmHg, the estimated right ventricular systolic pressure is 24.0 mmHg. Left Atrium: Left atrial size was normal in size. Right Atrium: Right atrial size was mild to moderately dilated. Pericardium: There is no evidence of pericardial effusion. Mitral Valve: The mitral valve is normal in structure and function. Normal mobility of the mitral valve leaflets. No evidence of mitral valve regurgitation. No evidence of mitral valve stenosis. Tricuspid Valve: The tricuspid valve is normal in structure. Tricuspid valve regurgitation is mild . No evidence of tricuspid stenosis. Aortic Valve: The aortic valve is normal in structure and function. Aortic valve regurgitation is not visualized. No aortic stenosis is present. Pulmonic Valve: The pulmonic valve was normal in structure. Pulmonic valve regurgitation is not visualized. No evidence of pulmonic stenosis. Aorta: The aortic root is normal in size and structure. Venous: The inferior vena cava is normal in size with less than 50% respiratory variability, suggesting right atrial pressure of 8 mmHg. The inferior vena cava and the hepatic vein show a normal flow pattern. IAS/Shunts: No atrial level shunt detected by  color flow Doppler.  LEFT VENTRICLE PLAX 2D LVIDd:         3.17 cm  Diastology LVIDs:         2.24 cm  LV e' lateral:   2.61 cm/s LV PW:         1.28 cm  LV E/e' lateral: 27.7 LV IVS:        1.25 cm  LV e' medial:    3.59 cm/s LVOT diam:     1.90 cm  LV E/e' medial:  20.1 LV SV:         20.41 ml LV SV Index:   11.42 LVOT Area:     2.84 cm  RIGHT VENTRICLE RV S prime:     4.90 cm/s TAPSE (M-mode): 0.9 cm LEFT ATRIUM             Index       RIGHT ATRIUM           Index LA diam:        2.50 cm 1.28 cm/m  RA Area:     17.00 cm LA Vol (A2C):   36.3 ml 18.56 ml/m RA Volume:    53.00 ml  27.09 ml/m LA Vol (A4C):   17.6 ml 9.00 ml/m LA Biplane Vol: 25.4 ml 12.98 ml/m  AORTIC VALVE LVOT Vmax:   56.90 cm/s LVOT Vmean:  39.500 cm/s LVOT VTI:    0.072 m  AORTA Ao Root diam: 2.70 cm Ao Asc diam:  2.90 cm MITRAL VALVE                        TRICUSPID VALVE MV Area (PHT): 8.43 cm             TR Peak grad:   29.2 mmHg MV Decel Time: 90 msec              TR Vmax:        270.00 cm/s MV E velocity: 72.30 cm/s 103 cm/s MV A velocity: 46.60 cm/s 70.3 cm/s SHUNTS MV E/A ratio:  1.55       1.5       Systemic VTI:  0.07 m                                     Systemic Diam: 1.90 cm Fransico Him MD Electronically signed by Fransico Him MD Signature Date/Time: 11/16/2019/12:17:23 PM    Final    DG Hip Unilat W or Wo Pelvis 2-3 Views Left  Result Date: 11/03/2019 CLINICAL DATA:  Left hip pain after ground level fall. EXAM: DG HIP (WITH OR WITHOUT PELVIS) 2-3V LEFT COMPARISON:  CT abdomen pelvis dated August 25, 2019. FINDINGS: There is no evidence of hip fracture or dislocation. There is no evidence of arthropathy or other focal bone abnormality. IMPRESSION: Negative. Electronically Signed   By: Titus Dubin M.D.   On: 11/03/2019 18:13   VAS [place] LOWER EXTREMITY VENOUS (DVT)  Result Date: 11/16/2019  Lower Venous DVTStudy Indications: Pulmonary embolism.  Comparison Study: no prior Performing Technologist: Abram Sander RVS  Examination Guidelines: A complete evaluation includes B-mode imaging, spectral Doppler, color Doppler, and power Doppler as needed of all accessible portions of each vessel. Bilateral testing is considered an integral part of a complete examination. Limited examinations for reoccurring indications may be performed as noted. The reflux portion of the exam is performed with the  patient in reverse Trendelenburg.  +---------+---------------+---------+-----------+----------+--------------+ RIGHT    CompressibilityPhasicitySpontaneityPropertiesThrombus Aging  +---------+---------------+---------+-----------+----------+--------------+ CFV      Full           Yes      Yes                                 +---------+---------------+---------+-----------+----------+--------------+ SFJ      Full                                                        +---------+---------------+---------+-----------+----------+--------------+ FV Prox  Full                                                        +---------+---------------+---------+-----------+----------+--------------+ FV Mid   Full                                                        +---------+---------------+---------+-----------+----------+--------------+ FV DistalFull                                                        +---------+---------------+---------+-----------+----------+--------------+ PFV      Full                                                        +---------+---------------+---------+-----------+----------+--------------+ POP      Full           Yes      Yes                                 +---------+---------------+---------+-----------+----------+--------------+ PTV      Full                                                        +---------+---------------+---------+-----------+----------+--------------+ PERO     Full                                                        +---------+---------------+---------+-----------+----------+--------------+   +---------+---------------+---------+-----------+----------+--------------+ LEFT     CompressibilityPhasicitySpontaneityPropertiesThrombus Aging +---------+---------------+---------+-----------+----------+--------------+ CFV      Full           Yes      Yes                                 +---------+---------------+---------+-----------+----------+--------------+  SFJ      Full                                                         +---------+---------------+---------+-----------+----------+--------------+ FV Prox  None                                         Acute          +---------+---------------+---------+-----------+----------+--------------+ FV Mid   None                                         Acute          +---------+---------------+---------+-----------+----------+--------------+ FV DistalNone                                         Acute          +---------+---------------+---------+-----------+----------+--------------+ PFV      Full                                                        +---------+---------------+---------+-----------+----------+--------------+ POP      Full           Yes      Yes                                 +---------+---------------+---------+-----------+----------+--------------+ PTV      Full                                                        +---------+---------------+---------+-----------+----------+--------------+ PERO     Full                                                        +---------+---------------+---------+-----------+----------+--------------+     Summary: RIGHT: - There is no evidence of deep vein thrombosis in the lower extremity.  - No cystic structure found in the popliteal fossa.  LEFT: - Findings consistent with acute deep vein thrombosis involving the left femoral vein. - No cystic structure found in the popliteal fossa.  *See table(s) above for measurements and observations. Electronically signed by Deitra Mayo MD on 11/16/2019 at 12:14:25 PM.    Final     Labs:  CBC: Recent Labs    11/15/19 1455 11/16/19 0306 11/16/19 1449 11/17/19 0343  WBC 14.6* 21.6* 17.9* 15.2*  HGB 15.3* 15.4* 14.4 14.1  HCT 48.3* 46.2* 43.0 42.0  PLT 335 331 257 229    COAGS: Recent Labs  11/15/19 1800  INR 1.1  APTT 30    BMP: Recent Labs    11/15/19 1455 11/16/19 0306 11/16/19 1449 11/17/19 0343  NA 134* 134* 134*  133*  K 3.9 5.5* 4.9 4.1  CL 96* 100 105 103  CO2 23 23 19* 19*  GLUCOSE 313* 257* 181* 94  BUN 18 21* 21* 19  CALCIUM 9.1 8.9 8.9 8.7*  CREATININE 1.47* 1.52* 1.34* 1.24*  GFRNONAA 42* 40* 47* 52*  GFRAA 49* 47* 55* 60*    LIVER FUNCTION TESTS: Recent Labs    09/09/19 1106 11/15/19 1800 11/16/19 0306 11/17/19 0343  BILITOT 0.8 0.5 0.8 0.4  AST 12* 34 67* 182*  ALT 11 18 45* 177*  ALKPHOS 73 115 122 101  PROT 6.9 6.6 7.1 5.8*  ALBUMIN 3.6 3.4* 3.6 2.7*    TUMOR MARKERS: No results for input(s): AFPTM, CEA, CA199, CHROMGRNA in the last 8760 hours.  Assessment and Plan:  Acute pulmonary embolism with evidence of right heart strain.  Her Simplified PESI score is +2.  Patient was seen, chart reviewed, and images reviewed by Dr. Earleen Newport.  He feels she would benefit from catheter directed thrombolysis.  Risks and benefits discussed with the patient including, but not limited to bleeding, possible life threatening bleeding and need for blood product transfusion, vascular injury, stroke, contrast induced renal failure, limb loss and infection.  All of the patient's questions were answered, patient is agreeable to proceed.  Consent signed and in chart.  Plan to proceed today as long as no other emergent procedure take precedence. If so, will plan for tomorrow.  Text page sent to Dr. Raelyn Mora re: need for ICU bed post procedure.  Thank you for this interesting consult.  I greatly enjoyed meeting Karla Hoover and look forward to participating in their care.  A copy of this report was sent to the requesting provider on this date.  Electronically Signed: Murrell Redden, PA-C   11/17/2019, 9:16 AM      I spent a total of 28 Miinutes in face to face in clinical consultation, greater than 50% of which was counseling/coordinating care for pulmonary embolism.

## 2019-11-18 LAB — PHOSPHORUS: Phosphorus: 2 mg/dL — ABNORMAL LOW (ref 2.5–4.6)

## 2019-11-18 LAB — COMPREHENSIVE METABOLIC PANEL
ALT: 194 U/L — ABNORMAL HIGH (ref 0–44)
AST: 181 U/L — ABNORMAL HIGH (ref 15–41)
Albumin: 2.8 g/dL — ABNORMAL LOW (ref 3.5–5.0)
Alkaline Phosphatase: 140 U/L — ABNORMAL HIGH (ref 38–126)
Anion gap: 11 (ref 5–15)
BUN: 20 mg/dL (ref 6–20)
CO2: 19 mmol/L — ABNORMAL LOW (ref 22–32)
Calcium: 8.8 mg/dL — ABNORMAL LOW (ref 8.9–10.3)
Chloride: 104 mmol/L (ref 98–111)
Creatinine, Ser: 1.11 mg/dL — ABNORMAL HIGH (ref 0.44–1.00)
GFR calc Af Amer: >60 mL/min (ref >60)
GFR calc non Af Amer: 59 mL/min — ABNORMAL LOW (ref >60)
Glucose, Bld: 103 mg/dL — ABNORMAL HIGH (ref 70–99)
Potassium: 4.2 mmol/L (ref 3.5–5.1)
Sodium: 134 mmol/L — ABNORMAL LOW (ref 135–145)
Total Bilirubin: 0.4 mg/dL (ref 0.3–1.2)
Total Protein: 6.1 g/dL — ABNORMAL LOW (ref 6.5–8.1)

## 2019-11-18 LAB — GLUCOSE, CAPILLARY
Glucose-Capillary: 178 mg/dL — ABNORMAL HIGH (ref 70–99)
Glucose-Capillary: 149 mg/dL — ABNORMAL HIGH (ref 70–99)
Glucose-Capillary: 150 mg/dL — ABNORMAL HIGH (ref 70–99)
Glucose-Capillary: 153 mg/dL — ABNORMAL HIGH (ref 70–99)
Glucose-Capillary: 179 mg/dL — ABNORMAL HIGH (ref 70–99)
Glucose-Capillary: 98 mg/dL (ref 70–99)
Glucose-Capillary: 127 mg/dL — ABNORMAL HIGH (ref 70–99)

## 2019-11-18 LAB — HEPARIN LEVEL (UNFRACTIONATED)
Heparin Unfractionated: 0.31 IU/mL (ref 0.30–0.70)
Heparin Unfractionated: 0.47 IU/mL (ref 0.30–0.70)

## 2019-11-18 LAB — CBC
HCT: 38.1 % (ref 36.0–46.0)
Hemoglobin: 12.5 g/dL (ref 12.0–15.0)
MCH: 28 pg (ref 26.0–34.0)
MCHC: 32.8 g/dL (ref 30.0–36.0)
MCV: 85.4 fL (ref 80.0–100.0)
Platelets: 235 10*3/uL (ref 150–400)
RBC: 4.46 MIL/uL (ref 3.87–5.11)
RDW: 12.5 % (ref 11.5–15.5)
WBC: 12.8 10*3/uL — ABNORMAL HIGH (ref 4.0–10.5)
nRBC: 0 % (ref 0.0–0.2)

## 2019-11-18 LAB — PROCALCITONIN: Procalcitonin: 0.42 ng/mL

## 2019-11-18 LAB — MAGNESIUM: Magnesium: 1.9 mg/dL (ref 1.7–2.4)

## 2019-11-18 MED ORDER — DICLOFENAC SODIUM 1 % EX GEL
2.0000 g | Freq: Three times a day (TID) | CUTANEOUS | Status: DC
  Administered 2019-11-18 – 2019-11-23 (×19): 2 g via TOPICAL
  Filled 2019-11-18: qty 100

## 2019-11-18 MED ORDER — MAGNESIUM OXIDE 400 (241.3 MG) MG PO TABS
800.0000 mg | ORAL_TABLET | Freq: Two times a day (BID) | ORAL | Status: DC
  Administered 2019-11-18 – 2019-11-21 (×7): 800 mg via ORAL
  Filled 2019-11-18 (×7): qty 2

## 2019-11-18 MED ORDER — POTASSIUM & SODIUM PHOSPHATES 280-160-250 MG PO PACK
2.0000 | PACK | Freq: Three times a day (TID) | ORAL | Status: DC
  Administered 2019-11-18 – 2019-11-21 (×7): 2 via ORAL
  Filled 2019-11-18 (×16): qty 2

## 2019-11-18 MED ORDER — MIRTAZAPINE 15 MG PO TABS
7.5000 mg | ORAL_TABLET | Freq: Every day | ORAL | Status: DC
  Administered 2019-11-18 – 2019-11-22 (×5): 7.5 mg via ORAL
  Filled 2019-11-18 (×5): qty 1

## 2019-11-18 MED ORDER — METOPROLOL TARTRATE 25 MG PO TABS
25.0000 mg | ORAL_TABLET | Freq: Two times a day (BID) | ORAL | Status: DC
  Administered 2019-11-18 – 2019-11-23 (×11): 25 mg via ORAL
  Filled 2019-11-18 (×11): qty 1

## 2019-11-18 MED ORDER — GABAPENTIN 300 MG PO CAPS
300.0000 mg | ORAL_CAPSULE | Freq: Three times a day (TID) | ORAL | Status: DC
  Administered 2019-11-18 – 2019-11-23 (×16): 300 mg via ORAL
  Filled 2019-11-18 (×16): qty 1

## 2019-11-18 MED ORDER — HYDROCODONE-ACETAMINOPHEN 5-325 MG PO TABS
1.0000 | ORAL_TABLET | Freq: Four times a day (QID) | ORAL | Status: DC | PRN
  Administered 2019-11-18 – 2019-11-21 (×12): 1 via ORAL
  Filled 2019-11-18 (×13): qty 1

## 2019-11-18 MED ORDER — INSULIN ASPART 100 UNIT/ML ~~LOC~~ SOLN
0.0000 [IU] | Freq: Three times a day (TID) | SUBCUTANEOUS | Status: DC
  Administered 2019-11-18: 21:00:00 2 [IU] via SUBCUTANEOUS
  Administered 2019-11-19: 3 [IU] via SUBCUTANEOUS
  Administered 2019-11-19: 22:00:00 8 [IU] via SUBCUTANEOUS
  Administered 2019-11-19: 17:00:00 5 [IU] via SUBCUTANEOUS
  Administered 2019-11-19 – 2019-11-20 (×2): 3 [IU] via SUBCUTANEOUS
  Administered 2019-11-20: 12:00:00 5 [IU] via SUBCUTANEOUS
  Administered 2019-11-20 (×2): 3 [IU] via SUBCUTANEOUS
  Administered 2019-11-21: 14:00:00 8 [IU] via SUBCUTANEOUS
  Administered 2019-11-21 (×3): 2 [IU] via SUBCUTANEOUS
  Administered 2019-11-22: 12:00:00 8 [IU] via SUBCUTANEOUS
  Administered 2019-11-22: 21:00:00 2 [IU] via SUBCUTANEOUS
  Administered 2019-11-23: 5 [IU] via SUBCUTANEOUS

## 2019-11-18 NOTE — TOC Progression Note (Signed)
Transition of Care [facility]) - Progression Note    Patient Details  Name: Karla Hoover MRN: [id number] Date of Birth: 12/19/1970  Transition [facility] CM/SW Contact  Eduard Roux, [place] Phone Number: 11/18/2019, 1:13 PM  Clinical Narrative:     Patient has accepted bed offer with [facility].  Antony Blackbird, MSW, LCSWA Clinical Social Worker   Expected Discharge Plan: Skilled Nursing Facility Barriers to Discharge: Continued Medical Work up, SNF Pending bed offer  Expected Discharge Plan and Services Expected Discharge Plan: Skilled Nursing Facility In-house Referral: Clinical Social Work     Living arrangements for the past 2 months: Apartment                                       Social Determinants of Health (SDOH) Interventions    Readmission Risk Interventions No flowsheet data found.

## 2019-11-18 NOTE — Progress Notes (Signed)
ANTICOAGULATION CONSULT NOTE - Follow Up Consult  Pharmacy Consult for Heparin Indication: pulmonary embolus and DVT LLE  Allergies  Allergen Reactions  . Guaifenesin Anaphylaxis  . Levaquin [Levofloxacin In D5w] Shortness Of Breath and Rash  . Strawberry Extract Anaphylaxis  . Kiwi Extract Other (See Comments)    Other reaction(s): Other (See Comments)     Patient Measurements: Height: 5' 6.5" (168.9 cm) Weight: 187 lb 6.3 oz (85 kg) IBW/kg (Calculated) : 60.45 Heparin Dosing Weight: 78 kg  Vital Signs: Temp: 97.6 F (36.4 C) (02/11 1125) Temp Source: Oral (02/11 1125) BP: 99/70 (02/11 1125) Pulse Rate: 104 (02/11 1125)  Labs: Recent Labs    11/15/19 1800 11/15/19 2306 11/16/19 0306 11/16/19 0840 11/16/19 1208 11/16/19 1208 11/16/19 1449 11/17/19 0343 11/17/19 1038 11/17/19 1840 11/18/19 0104 11/18/19 1031  HGB  --    < >   < >  --   --    < > 14.4 14.1  --   --  12.5  --   HCT  --    < >   < >  --   --   --  43.0 42.0  --   --  38.1  --   PLT  --    < >   < >  --   --   --  257 229  --   --  235  --   APTT 30  --   --   --   --   --   --   --   --   --   --   --   LABPROT 13.7  --   --   --   --   --   --   --   --   --   --   --   INR 1.1  --   --   --   --   --   --   --   --   --   --   --   HEPARINUNFRC  --    < >   < >  --  0.49   < >  --  0.27*   < > 0.42 0.31 0.47  CREATININE  --    < >   < >  --   --   --  1.34* 1.24*  --   --  1.11*  --   TROPONINIHS 83*   < >  --  289* 274*  --  235*  --   --   --   --   --    < > = values in this interval not displayed.    Estimated Creatinine Clearance: 69.5 mL/min (A) (by C-G formula based on SCr of 1.11 mg/dL (H)).  Assessment:  Patient is a 48 yof that presents to the ED with complaints of chest pain. The patient was found to have a large PE with evidence of right heart strain (elevated trop) and also noted with duplex positive for LLE DVT . Pharmacy has been asked to dose heparin. Considered for catheter  directed thrombolysis but now deferred.    Heparin level remains therapeutic (0.47) on 1700 units/hr.  Last level 0.31 on same rate, has not trended down. CBC trended down but remains within normal range. No bleeding reported.  Goal of Therapy:  Heparin level 0.3-0.7 units/ml Monitor platelets by anticoagulation protocol: Yes   Plan:   Continue heparin drip at 1700 units/hr  Daily  heparin level and CBC  Follow up oral anticoagulation plans when able.  Arty Baumgartner, [place] Phone: [phone] 11/18/2019,12:25 PM

## 2019-11-18 NOTE — Evaluation (Signed)
Physical Therapy Re-Evaluation Patient Details Name: Karla Hoover MRN: [id number] DOB: 09-15-1971 Today's Date: 11/18/2019   History of Present Illness  Pt is a 49 y.o. female admitted 11/15/19 with chest discomfort, SOB, frequent falls and difficulty performing ADLs at home. CTA shows bilateral submassive pulmonary embolism with evidence of R heart strain. Also with acute L femoral DVT and UTI. PMH includes CVA (residual L-side hemiplegia), HTN, COPD, DM2, chronic hypoxia (on home 2-3L O2), DM2, tobacco use.    Clinical Impression  Pt presents with an overall decrease in functional mobility secondary to above. PTA, pt lives with sister, typically able to transfer to w/c herself but has had increased difficulty with this and ADL tasks, multiple falls at home. Today, pt able to initiate standing and transfer training with minA; at high risk for falls. Good insight into L-side hemiplegia and compensatory strategies. Educ re: precautions, positioning (encouraged more frequent LUE AAROM), fall risk reduction. Pt would benefit from continued acute PT services to maximize functional mobility and independence prior to d/c with SNF-level therapies.     Follow Up Recommendations SNF;Supervision/Assistance - 24 hour    Equipment Recommendations  Other (comment)(defer)    Recommendations for Other Services       Precautions / Restrictions Precautions Precautions: Fall;Other (comment) Precaution Comments: L hemiplegia      Mobility  Bed Mobility Overal bed mobility: Needs Assistance Bed Mobility: Supine to Sit     Supine to sit: Supervision     General bed mobility comments: Mod indep with use of RUE to lower bed rail and assist LUE/LLE to EOB, use of bed rail; supervision for safety  Transfers Overall transfer level: Needs assistance Equipment used: 1 person hand held assist Transfers: Sit to/from Stand Sit to Stand: Min assist         General transfer comment: Performed 3x  standing, pt initially reaching for recliner armrest and able to pull up into standing with min guard; 2x more trials from recliner, minA for UE support to elevate trunk  Ambulation/Gait Ambulation/Gait assistance: Min assist;Mod assist Gait Distance (Feet): 1 Feet Assistive device: 1 person hand held assist Gait Pattern/deviations: Step-to pattern;Decreased dorsiflexion - left;Decreased weight shift to left   Gait velocity interpretation: <1.31 ft/sec, indicative of household ambulator General Gait Details: Pt took a few steps forwards/backwards with min-modA for stability and HHA; able to take partial step with LLE with compensatory trunk movement to step forwards/backwards, modA for RUE support when taking step with RLE  Stairs            Wheelchair Mobility    Modified Rankin (Stroke Patients Only)       Balance Overall balance assessment: Needs assistance Sitting-balance support: Single extremity supported;Feet supported Sitting balance-Leahy Scale: Fair       Standing balance-Leahy Scale: Poor Standing balance comment: Reliant on RUE support; fearful of falling                             Pertinent Vitals/Pain Pain Assessment: No/denies pain Pain Intervention(s): Monitored during session    Home Living Family/patient expects to be discharged to:: Skilled nursing facility Living Arrangements: Other relatives(sister) Available Help at Discharge: Family Type of Home: House           Additional Comments: pt lives in [place] with family (had been in [place] in [place] following CVA 05/2019 prior to coming to [place]).    Prior Function Level of Independence: Needs assistance  Gait / Transfers Assistance Needed: Uses w/c and can walk short distances (holding onto wall/furniture), intermittent assist from sister for stand pivot tranfers. Sleeps in recliner  ADL's / Homemaking Assistance Needed: Pt reports that her sister was assisting her with LB  ADLs and that she takes her to a place that has a handicapped shower for bathing        Hand Dominance   Dominant Hand: Right    Extremity/Trunk Assessment   Upper Extremity Assessment Upper Extremity Assessment: Generalized weakness;LUE deficits/detail LUE Deficits / Details: L UE hemiplegic due to CVA 05/2019 - IR/flexion contractures, able to passively extend elbow through partial ROM with RUE assist LUE Sensation: decreased light touch LUE Coordination: decreased fine motor;decreased gross motor    Lower Extremity Assessment Lower Extremity Assessment: LLE deficits/detail LLE Deficits / Details: LLE hemiplegia due to CVA 05/2019 - no active ankle or knee motion noted, pt able to minimally abduct/extend hip when standing, likely with aid of compensatory muscle movements LLE Sensation: decreased light touch LLE Coordination: decreased gross motor;decreased fine motor       Communication   Communication: No difficulties  Cognition Arousal/Alertness: Awake/alert Behavior During Therapy: WFL for tasks assessed/performed Overall Cognitive Status: Within Functional Limits for tasks assessed                                 General Comments: [facility] for simple tasks, not formally assessed      General Comments General comments (skin integrity, edema, etc.): Supine BP 113/84, sitting BP 112/88, post-transfer BP 110/78; SpO2 95% on RA; HR 106-115    Exercises Other Exercises Other Exercises: L elbow flexion AAROM with RUE assist   Assessment/Plan    PT Assessment Patient needs continued PT services  PT Problem List Decreased strength;Decreased mobility;Decreased safety awareness;Decreased range of motion;Decreased coordination;Decreased knowledge of precautions;Decreased activity tolerance;Decreased cognition;Decreased balance;Decreased knowledge of use of DME       PT Treatment Interventions DME instruction;Therapeutic activities;Gait training;Therapeutic  exercise;Patient/family education;Balance training;Wheelchair mobility training;Functional mobility training;Neuromuscular re-education    PT Goals (Current goals can be found in the Care Plan section)  Acute Rehab PT Goals Patient Stated Goal: go to rehab at SNF PT Goal Formulation: With patient Time For Goal Achievement: 12/02/19 Potential to Achieve Goals: Good    Frequency Min 3X/week   Barriers to discharge Inaccessible home environment;Decreased caregiver support      Co-evaluation               AM-PAC PT "6 Clicks" Mobility  Outcome Measure Help needed turning from your back to your side while in a flat bed without using bedrails?: A Little Help needed moving from lying on your back to sitting on the side of a flat bed without using bedrails?: A Little Help needed moving to and from a bed to a chair (including a wheelchair)?: A Little Help needed standing up from a chair using your arms (e.g., wheelchair or bedside chair)?: A Little Help needed to walk in hospital room?: A Lot Help needed climbing 3-5 steps with a railing? : Total 6 Click Score: 15    End of Session   Activity Tolerance: Patient tolerated treatment well Patient left: in chair;with call bell/phone within reach Nurse Communication: Mobility status PT Visit Diagnosis: Muscle weakness (generalized) (M62.81);Difficulty in walking, not elsewhere classified (R26.2);History of falling (Z91.81);Hemiplegia and hemiparesis Hemiplegia - Right/Left: Left Hemiplegia - dominant/non-dominant: Non-dominant Hemiplegia - caused by: Cerebral infarction  Time: 8335-8251 PT Time Calculation (min) (ACUTE ONLY): 30 min   Charges:   PT Evaluation $PT Re-evaluation: 1 Re-eval PT Treatments $Therapeutic Activity: 8-22 mins      Mabeline Caras, PT, DPT Acute Rehabilitation Services  Pager [phone] Office [place] 11/18/2019, 5:36 PM

## 2019-11-18 NOTE — Progress Notes (Signed)
ANTICOAGULATION CONSULT NOTE   Pharmacy Consult for Heparin Indication: pulmonary embolus  Allergies  Allergen Reactions  . Guaifenesin Anaphylaxis  . Levaquin [Levofloxacin In D5w] Shortness Of Breath and Rash  . Strawberry Extract Anaphylaxis  . Kiwi Extract Other (See Comments)    Other reaction(s): Other (See Comments)     Patient Measurements: Height: 5' 6.5" (168.9 cm) Weight: 187 lb 6.3 oz (85 kg) IBW/kg (Calculated) : 60.45 Heparin Dosing Weight: 78.4kg  Vital Signs: Temp: 98.2 F (36.8 C) (02/10 2358) Temp Source: Oral (02/10 2358) BP: 106/76 (02/11 0100) Pulse Rate: 130 (02/11 0100)  Labs: Recent Labs    11/15/19 1800 11/15/19 2306 11/16/19 0306 11/16/19 0306 11/16/19 0515 11/16/19 0840 11/16/19 1208 11/16/19 1208 11/16/19 1449 11/17/19 0343 11/17/19 0343 11/17/19 1038 11/17/19 1840 11/18/19 0104  HGB  --   --  15.4*   < >  --   --   --    < > 14.4 14.1  --   --   --  12.5  HCT  --   --  46.2*   < >  --   --   --   --  43.0 42.0  --   --   --  38.1  PLT  --   --  331   < >  --   --   --   --  257 229  --   --   --  235  APTT 30  --   --   --   --   --   --   --   --   --   --   --   --   --   LABPROT 13.7  --   --   --   --   --   --   --   --   --   --   --   --   --   INR 1.1  --   --   --   --   --   --   --   --   --   --   --   --   --   HEPARINUNFRC  --   --  0.83*   < >  --   --  0.49   < >  --  0.27*   < > 0.23* 0.42 0.31  CREATININE  --   --  1.52*  --   --   --   --   --  1.34* 1.24*  --   --   --   --   TROPONINIHS 83*   < > 278*  --    < > 289* 274*  --  235*  --   --   --   --   --    < > = values in this interval not displayed.    Estimated Creatinine Clearance: 62.2 mL/min (A) (by C-G formula based on SCr of 1.24 mg/dL (H)).   Medications:  Scheduled:  . arformoterol  15 mcg Nebulization BID  . atorvastatin  80 mg Oral Daily  . budesonide (PULMICORT) nebulizer solution  0.5 mg Nebulization Q12H  . buPROPion  100 mg Oral BID  .  Chlorhexidine Gluconate Cloth  6 each Topical Daily  . clopidogrel  75 mg Oral Daily  . insulin aspart  0-15 Units Subcutaneous Q4H  . insulin glargine  15 Units Subcutaneous Daily  . ipratropium  0.5 mg Nebulization BID  .  levothyroxine  50 mcg Oral Daily  . nicotine  21 mg Transdermal Daily  . sodium chloride flush  3 mL Intravenous Once    Assessment: Patient is a 16 yof that presents to the ED with complaints of chest pain. The patient was found to have a large PE with evidence of right heart strain (elevated trop) and also noted with duplex positive for LLE DVT . Pharmacy has been asked to dose heparin.   Heparin level low end of goal and trending down  Goal of Therapy:  Heparin level 0.3-0.7 units/ml Monitor platelets by anticoagulation protocol: Yes   Plan: -Inc heparin to 1700 units/hr to prevent low level with PE -Re-check heparin level at 1000 today  Narda Bonds, PharmD, BCPS Clinical Pharmacist Phone: [phone]

## 2019-11-18 NOTE — Progress Notes (Signed)
PROGRESS NOTE    Azalee Courseaulette C Addair  XLK:[id number] DOB: [birth year] DOA: 11/15/2019 PCP: Grayce SessionsEdwards, Michelle P, NP    Brief Narrative:  49 year old female with extensive medical issues including COPD, chronic hypoxia on home oxygen 2 to 3 L at home, type 2 diabetes, history of CVA with left-sided hemiplegia, hypertension, ongoing cigarette smoker and poor compliance presented to the emergency room with chest discomfort, shortness of breath, frequent falls and unable to maintain her daily activities of living at home.  Apparently, patient moved from [place] where she was at a nursing home to live with her sisters.  In the emergency room, tachycardic, blood pressure stable, acute kidney injury with creatinine 1.47, WBC 14.6.  Lactic acid 2.7. Treated with bolus IV fluids with improvement.  CTA shows bilateral submassive pulmonary embolism with CT scan evidence of right heart strain, minimally elevated troponins, urine examination consistent with UTI.  She was admitted with antibiotics and heparin infusion.   Assessment & Plan:   Principal Problem:   Bilateral pulmonary embolism (HCC) Active Problems:   Essential hypertension   COPD with chronic bronchitis (HCC)   DM (diabetes mellitus), type 2 (HCC)   History of stroke   AKI (acute kidney injury) (HCC)   Tobacco abuse   Elevated troponin  Bilateral pulmonary embolism with cor pulmonale, left femoral vein acute DVT:  Thromboembolism due to immobility, likely started from left leg. Blood pressure is stabilizing after fluid resuscitation.  Continue maintenance IV fluids.  Oxygen remains fairly stable, she is already on 2 L oxygen at home. Continue heparin infusion until clinical stabilization and improvement of heart rate. 2D echocardiogram showed right ventricular strain.   Followed by pulmonary and IR.   Was scheduled for catheter placement and TPA, however given overall risk factors, being on Plavix, recent stroke and good clinical  response on heparin, pulmonary suggested not doing TPA but monitoring.  will convert to oral anticoagulation after clinical stabilization. Appreciate pulmonary input.  Sepsis, present on admission: Ruled out.  Procalcitonin normal.  Treated with broad-spectrum antibiotics and now remains on cefepime.  Blood cultures negative. her presentation could probably from thromboembolism. Discontinue all antibiotics and monitor.  Elevated troponins: Due to acute PE.  Stable and flat.  History of stroke with left-sided hemiplegia: Will need more therapies and placement for rehab.  She is on Plavix that we will continue.  Already on a statin and tolerating well.  COPD with chronic hypoxic respiratory failure: Stable.  No evidence of exacerbation.  On bronchodilator as needed.  Type 2 diabetes on insulin with hyperglycemia: Continue current doses of insulin, A1c is 8.1.  Prolonged QT interval: Improved with electrolyte replacement.  Acute renal failure: improving, adequate urine out put.  Continue monitoring.  Sinus tachycardia: Due to combination of factors.  Patient is on chronic pain medicine that was stopped, will resume, she is on metoprolol that we will resume.  Start mobilizing.  Resume home medications.  DVT prophylaxis: Heparin infusion Code Status: Full code Family Communication: Patient's mother on the phone. Disposition Plan: patient is from home. Anticipated DC to SNF, Barriers to discharge on active treatment with IV drips, heparin infusions .   Consultants:  PCCM,  Interventional radiology Procedures:   None  Antimicrobials:  Anti-infectives (From admission, onward)   Start     Dose/Rate Route Frequency Ordered Stop   11/16/19 1800  vancomycin (VANCOREADY) IVPB 1250 mg/250 mL  Status:  Discontinued     1,250 mg 166.7 mL/hr over 90 Minutes Intravenous Every 24  hours 11/15/19 1805 11/15/19 1816   11/16/19 0600  ceFEPIme (MAXIPIME) 2 g in sodium chloride 0.9 % 100 mL IVPB   Status:  Discontinued     2 g 200 mL/hr over 30 Minutes Intravenous Every 12 hours 11/15/19 1805 11/18/19 1042   11/15/19 1815  vancomycin (VANCOREADY) IVPB 1250 mg/250 mL  Status:  Discontinued     1,250 mg 166.7 mL/hr over 90 Minutes Intravenous Every 24 hours 11/15/19 1816 11/15/19 2208   11/15/19 1800  ceFEPIme (MAXIPIME) 2 g in sodium chloride 0.9 % 100 mL IVPB     2 g 200 mL/hr over 30 Minutes Intravenous  Once 11/15/19 1756 11/15/19 1910   11/15/19 1800  metroNIDAZOLE (FLAGYL) IVPB 500 mg     500 mg 100 mL/hr over 60 Minutes Intravenous  Once 11/15/19 1756 11/15/19 1950   11/15/19 1800  vancomycin (VANCOCIN) IVPB 1000 mg/200 mL premix  Status:  Discontinued     1,000 mg 200 mL/hr over 60 Minutes Intravenous  Once 11/15/19 1756 11/15/19 1816         Subjective: Patient seen and examined.  Complains of left scapular pain which is chronic and she uses Norco and Voltaren for it.  She could not sleep last night, she needs some Remeron as she has taken it for a long time. No other overnight events. Mostly remains on room air.  Heart rate is 120-130. Her mom was on the phone and we discussed in detail along with the patient.  Objective: Vitals:   11/18/19 0200 11/18/19 0320 11/18/19 0744 11/18/19 0900  BP: (!) 125/96 (!) 124/98  (!) 131/97  Pulse: (!) 132 (!) 134  (!) 126  Resp: 20 14  19   Temp:  98.4 F (36.9 C)  98.1 F (36.7 C)  TempSrc:  Oral  Oral  SpO2: 92% 91% 92% 93%  Weight:      Height:        Intake/Output Summary (Last 24 hours) at 11/18/2019 1047 Last data filed at 11/18/2019 0606 Gross per 24 hour  Intake 547.52 ml  Output 500 ml  Net 47.52 ml   Filed Weights   11/15/19 1832  Weight: 85 kg    Examination:  General exam: Appears calm and comfortable on 2 L oxygen. Not in any distress.  Chronically sick looking.  Sitting at the edge of the bed by herself. Respiratory system: Clear to auscultation. Respiratory effort normal.  On room air  today. Cardiovascular system: S1 & S2 heard, tachycardic. no JVD, murmurs, rubs, gallops or clicks. Left leg has 1+ edema.    Gastrointestinal system: Abdomen is nondistended, soft and nontender. No organomegaly or masses felt. Normal bowel sounds heard. Central nervous system: Alert and oriented x4 .  Left-sided complete hemiplegia, upper extremity 0/5, lower extremity 1/5.   Extremities: Left hemiparesis with left upper extremity contracture,  Skin: No rashes, lesions or ulcers Psychiatry: Judgement and insight appear normal. Mood & affect anxious.    Data Reviewed: I have personally reviewed following labs and imaging studies  CBC: Recent Labs  Lab 11/15/19 1455 11/16/19 0306 11/16/19 1449 11/17/19 0343 11/18/19 0104  WBC 14.6* 21.6* 17.9* 15.2* 12.8*  NEUTROABS  --   --  11.5*  --   --   HGB 15.3* 15.4* 14.4 14.1 12.5  HCT 48.3* 46.2* 43.0 42.0 38.1  MCV 89.6 83.7 84.0 84.2 85.4  PLT 335 331 257 229 235   Basic Metabolic Panel: Recent Labs  Lab 11/15/19 1455 11/16/19 0033 11/16/19 0306  11/16/19 1449 11/17/19 0343 11/18/19 0104  NA 134*  --  134* 134* 133* 134*  K 3.9  --  5.5* 4.9 4.1 4.2  CL 96*  --  100 105 103 104  CO2 23  --  23 19* 19* 19*  GLUCOSE 313*  --  257* 181* 94 103*  BUN 18  --  21* 21* 19 20  CREATININE 1.47*  --  1.52* 1.34* 1.24* 1.11*  CALCIUM 9.1  --  8.9 8.9 8.7* 8.8*  MG  --  1.7 1.8  --  2.2 1.9  PHOS  --   --  3.9  --  2.4* 2.0*   GFR: Estimated Creatinine Clearance: 69.5 mL/min (A) (by C-G formula based on SCr of 1.11 mg/dL (H)). Liver Function Tests: Recent Labs  Lab 11/15/19 1800 11/16/19 0306 11/17/19 0343 11/18/19 0104  AST 34 67* 182* 181*  ALT 18 45* 177* 194*  ALKPHOS 115 122 101 140*  BILITOT 0.5 0.8 0.4 0.4  PROT 6.6 7.1 5.8* 6.1*  ALBUMIN 3.4* 3.6 2.7* 2.8*   No results for input(s): LIPASE, AMYLASE in the last 168 hours. No results for input(s): AMMONIA in the last 168 hours. Coagulation Profile: Recent Labs   Lab 11/15/19 1800  INR 1.1   Cardiac Enzymes: No results for input(s): CKTOTAL, CKMB, CKMBINDEX, TROPONINI in the last 168 hours. BNP (last 3 results) No results for input(s): PROBNP in the last 8760 hours. HbA1C: Recent Labs    11/16/19 0306  HGBA1C 8.1*   CBG: Recent Labs  Lab 11/17/19 0417 11/17/19 0745 11/17/19 1159 11/17/19 1633 11/17/19 2009  GLUCAP 100* 164* 130* 128* 219*   Lipid Profile: No results for input(s): CHOL, HDL, LDLCALC, TRIG, CHOLHDL, LDLDIRECT in the last 72 hours. Thyroid Function Tests: Recent Labs    11/16/19 0306  TSH 4.882*   Anemia Panel: No results for input(s): VITAMINB12, FOLATE, FERRITIN, TIBC, IRON, RETICCTPCT in the last 72 hours. Sepsis Labs: Recent Labs  Lab 11/16/19 0026 11/16/19 0033 11/16/19 0306 11/16/19 1449 11/16/19 2337 11/17/19 0343 11/17/19 1038 11/18/19 0104  PROCALCITON  --  0.24  --  0.57  --  0.43  --  0.42  LATICACIDVEN   < >  --  2.4* 1.9 2.9*  --  1.5  --    < > = values in this interval not displayed.    Recent Results (from the past 240 hour(s))  Blood culture (routine x 2)     Status: None (Preliminary result)   Collection Time: 11/15/19  4:00 PM   Specimen: BLOOD RIGHT FOREARM  Result Value Ref Range Status   Specimen Description BLOOD RIGHT FOREARM  Final   Special Requests   Final    BOTTLES DRAWN AEROBIC AND ANAEROBIC Blood Culture results may not be optimal due to an inadequate volume of blood received in culture bottles   Culture   Final    NO GROWTH 2 DAYS Performed at [facility], [address], [place], [place] [address]    Report Status PENDING  Incomplete  Respiratory Panel by RT PCR (Flu A&B, Covid) - Nasopharyngeal Swab     Status: None   Collection Time: 11/15/19  4:19 PM   Specimen: Nasopharyngeal Swab  Result Value Ref Range Status   SARS Coronavirus 2 by RT PCR NEGATIVE NEGATIVE Final    Comment: (NOTE) SARS-CoV-2 target nucleic acids are NOT DETECTED. The SARS-CoV-2  RNA is generally detectable in upper respiratoy specimens during the acute phase of  infection. The lowest concentration of SARS-CoV-2 viral copies this assay can detect is 131 copies/mL. A negative result does not preclude SARS-Cov-2 infection and should not be used as the sole basis for treatment or other patient management decisions. A negative result may occur with  improper specimen collection/handling, submission of specimen other than nasopharyngeal swab, presence of viral mutation(s) within the areas targeted by this assay, and inadequate number of viral copies (<131 copies/mL). A negative result must be combined with clinical observations, patient history, and epidemiological information. The expected result is Negative. Fact Sheet for Patients:  https://www.moore.com/ Fact Sheet for Healthcare Providers:  https://www.young.biz/ This test is not yet ap proved or cleared by the [place] FDA and  has been authorized for detection and/or diagnosis of SARS-CoV-2 by FDA under an Emergency Use Authorization (EUA). This EUA will remain  in effect (meaning this test can be used) for the duration of the COVID-19 declaration under Section 564(b)(1) of the Act, 21 U.S.C. section 360bbb-3(b)(1), unless the authorization is terminated or revoked sooner.    Influenza A by PCR NEGATIVE NEGATIVE Final   Influenza B by PCR NEGATIVE NEGATIVE Final    Comment: (NOTE) The Xpert Xpress SARS-CoV-2/FLU/RSV assay is intended as an aid in  the diagnosis of influenza from Nasopharyngeal swab specimens and  should not be used as a sole basis for treatment. Nasal washings and  aspirates are unacceptable for Xpert Xpress SARS-CoV-2/FLU/RSV  testing. Fact Sheet for Patients: https://www.moore.com/ Fact Sheet for Healthcare Providers: https://www.young.biz/ This test is not yet approved or cleared by the [place] FDA  and  has been authorized for detection and/or diagnosis of SARS-CoV-2 by  FDA under an Emergency Use Authorization (EUA). This EUA will remain  in effect (meaning this test can be used) for the duration of the  Covid-19 declaration under Section 564(b)(1) of the Act, 21  U.S.C. section 360bbb-3(b)(1), unless the authorization is  terminated or revoked. Performed at [facility], [address], [place], [place] [address]   Blood culture (routine x 2)     Status: None (Preliminary result)   Collection Time: 11/15/19  9:04 PM   Specimen: BLOOD  Result Value Ref Range Status   Specimen Description BLOOD RIGHT ANTECUBITAL  Final   Special Requests   Final    BOTTLES DRAWN AEROBIC AND ANAEROBIC Blood Culture adequate volume   Culture   Final    NO GROWTH 2 DAYS Performed at [facility], [address], [place], [place] [address]    Report Status PENDING  Incomplete  Urine culture     Status: None   Collection Time: 11/15/19  9:30 PM   Specimen: In/Out Cath Urine  Result Value Ref Range Status   Specimen Description IN/OUT CATH URINE  Final   Special Requests NONE  Final   Culture   Final    NO GROWTH Performed at [facility], [address], [place], [place] [address]    Report Status 11/17/2019 FINAL  Final         Radiology Studies: ECHOCARDIOGRAM COMPLETE  Result Date: 11/16/2019    ECHOCARDIOGRAM REPORT   Patient Name:   DEANIE FARIES Date of Exam: 11/16/2019 Medical Rec #:  [id number]         Height:       66.5 in Accession #:    [id number]        Weight:       187.4 lb Date of Birth:  [birth year]         BSA:          1.96 m Patient Age:    48 years          BP:           124/101 mmHg Patient Gender: F                 HR:           133 bpm. Exam Location:  Inpatient Procedure: 2D Echo, Color Doppler and Cardiac Doppler Indications:    I26.02 Pulmonary embolus  History:        Patient has no prior history of Echocardiogram examinations.                  COPD; Risk Factors:Hypertension and Diabetes.  Sonographer:    Irving Burton Senior RDCS Referring Phys: Consepcion Hearing ANASTASSIA DOUTOVA IMPRESSIONS  1. Left ventricular ejection fraction, by estimation, is 65 to 70%. The left ventricle has normal function. The left ventrical has no regional wall motion abnormalities. There is mildly increased left ventricular hypertrophy. Left ventricular diastolic parameters are indeterminate. Elevated left ventricular end-diastolic pressure.  2. Right ventricular systolic function is severely reduced. The right ventricular size is moderately enlarged. There is mildly elevated pulmonary artery systolic pressure. The estimated right ventricular systolic pressure is 37.2 mmHg.  3. Right atrial size was mild to moderately dilated.  4. No evidence of mitral valve regurgitation.  5. The aortic valve is normal in structure and function. Aortic valve regurgitation is not visualized. No aortic stenosis is present.  6. The inferior vena cava is normal in size with <50% respiratory variability, suggesting right atrial pressure of 8 mmHg. FINDINGS  Left Ventricle: Left ventricular ejection fraction, by estimation, is 65 to 70%. The left ventricle has normal function. The left ventricle has no regional wall motion abnormalities. The left ventricular internal cavity size was normal in size. There is  mildly increased left ventricular hypertrophy. Concentric left ventricular hypertrophy. Left ventricular diastolic parameters are indeterminate. Right Ventricle: The right ventricular size is moderately enlarged. No increase in right ventricular wall thickness. Right ventricular systolic function is severely reduced. There is mildly elevated pulmonary artery systolic pressure. The tricuspid regurgitant velocity is 2.70 m/s, and with an assumed right atrial pressure of 8 mmHg, the estimated right ventricular systolic pressure is 37.2 mmHg. Left Atrium: Left atrial size was normal in size. Right Atrium: Right  atrial size was mild to moderately dilated. Pericardium: There is no evidence of pericardial effusion. Mitral Valve: The mitral valve is normal in structure and function. Normal mobility of the mitral valve leaflets. No evidence of mitral valve regurgitation. No evidence of mitral valve stenosis. Tricuspid Valve: The tricuspid valve is normal in structure. Tricuspid valve regurgitation is mild . No evidence of tricuspid stenosis. Aortic Valve: The aortic valve is normal in structure and function. Aortic valve regurgitation is not visualized. No aortic stenosis is present. Pulmonic Valve: The pulmonic valve was normal in structure. Pulmonic valve regurgitation is not visualized. No evidence of pulmonic stenosis. Aorta: The aortic root is normal in size and structure. Venous: The inferior vena cava is normal in size with less than 50% respiratory variability, suggesting right atrial pressure of 8 mmHg. The inferior vena cava and the hepatic vein show a normal flow pattern. IAS/Shunts: No atrial level shunt detected by color flow Doppler.  LEFT VENTRICLE PLAX 2D LVIDd:         3.17 cm  Diastology  LVIDs:         2.24 cm  LV e' lateral:   2.61 cm/s LV PW:         1.28 cm  LV E/e' lateral: 27.7 LV IVS:        1.25 cm  LV e' medial:    3.59 cm/s LVOT diam:     1.90 cm  LV E/e' medial:  20.1 LV SV:         20.41 ml LV SV Index:   11.42 LVOT Area:     2.84 cm  RIGHT VENTRICLE RV S prime:     4.90 cm/s TAPSE (M-mode): 0.9 cm LEFT ATRIUM             Index       RIGHT ATRIUM           Index LA diam:        2.50 cm 1.28 cm/m  RA Area:     17.00 cm LA Vol (A2C):   36.3 ml 18.56 ml/m RA Volume:   53.00 ml  27.09 ml/m LA Vol (A4C):   17.6 ml 9.00 ml/m LA Biplane Vol: 25.4 ml 12.98 ml/m  AORTIC VALVE LVOT Vmax:   56.90 cm/s LVOT Vmean:  39.500 cm/s LVOT VTI:    0.072 m  AORTA Ao Root diam: 2.70 cm Ao Asc diam:  2.90 cm MITRAL VALVE                        TRICUSPID VALVE MV Area (PHT): 8.43 cm             TR Peak grad:   29.2  mmHg MV Decel Time: 90 msec              TR Vmax:        270.00 cm/s MV E velocity: 72.30 cm/s 103 cm/s MV A velocity: 46.60 cm/s 70.3 cm/s SHUNTS MV E/A ratio:  1.55       1.5       Systemic VTI:  0.07 m                                     Systemic Diam: 1.90 cm Fransico Him MD Electronically signed by Fransico Him MD Signature Date/Time: 11/16/2019/12:17:23 PM    Final         Scheduled Meds: . arformoterol  15 mcg Nebulization BID  . atorvastatin  80 mg Oral Daily  . budesonide (PULMICORT) nebulizer solution  0.5 mg Nebulization Q12H  . buPROPion  100 mg Oral BID  . Chlorhexidine Gluconate Cloth  6 each Topical Daily  . clopidogrel  75 mg Oral Daily  . diclofenac Sodium  2 g Topical TID AC & HS  . gabapentin  300 mg Oral TID  . insulin aspart  0-15 Units Subcutaneous Q4H  . insulin glargine  15 Units Subcutaneous Daily  . levothyroxine  50 mcg Oral Daily  . magnesium oxide  800 mg Oral BID  . metoprolol tartrate  25 mg Oral BID  . mirtazapine  7.5 mg Oral QHS  . nicotine  21 mg Transdermal Daily  . sodium chloride flush  3 mL Intravenous Once   Continuous Infusions: . heparin 1,700 Units/hr (11/18/19 0202)     LOS: 2 days    Time spent: 30 minutes    Barb Merino, MD [facility] Pager [phone]

## 2019-11-18 NOTE — Evaluation (Signed)
Occupational Therapy Evaluation Patient Details Name: Karla Hoover MRN: [id number] DOB: December 13, 1970 Today's Date: 11/18/2019    History of Present Illness 49 year old female with extensive medical issues including COPD, chronic hypoxia on home oxygen 2 to 3 L at home, type 2 diabetes, history of CVA with left-sided hemiplegia, hypertension, ongoing cigarette smoker and poor compliance presented to the emergency room with chest discomfort, shortness of breath, frequent falls and unable to maintain her daily activities of living at home.   Clinical Impression   Pt with decline in function and safety with ADLs and ADL mobility with impaired strength, balance, endurance, L UE ROM/function (hemiplegic). Pt was living in a SNF after having CVA 05/2019, but then move dot [place] to live with her sister. Pt was able to SPT and was at w/c level, LB ADL assist and she states that she sleeps in a recliner and that her sister "takes her to a place with handicapped shower and assists her with bathing. Pt reports that she has fallen twice at home and that its becoming more difficulty for her family to care for her. Pt currently requires sup to sit EOB using rails and R UE to advance her L LE to and off EOB with increased time and effort, total A with LEs back onto bed. Pt currently requires min guard A with grooming seated EOB, mod A with UB ADLs, max - total A with LB ADLs, total A with toileting. Unable to attempt sit - stand/SPTs due to pt having HA and beginning to sweat after sitting EOB x 5  Minutes. Pt's resting HR 105-106 and remained that thoroughout activity, O2 SATS 95%. Pt returned pt supine and RN notified. Pt would benefit from acute OT services to address impairments to maximize level of function and safety    Follow Up Recommendations  SNF;Supervision/Assistance - 24 hour(pt also requesting [facility])    Equipment Recommendations  Other (comment)(TBD at next venue of care)    Recommendations for Other  Services       Precautions / Restrictions        Mobility Bed Mobility Overal bed mobility: Needs Assistance Bed Mobility: Supine to Sit;Sit to Supine     Supine to sit: Supervision Sit to supine: Total assist   General bed mobility comments: sup to sit EOB using rails and R UE to advance L LE to and off EOB, required increased time and effort . Total A with LEs back onto bed.  Transfers                 General transfer comment: unable to attempt    Balance Overall balance assessment: Needs assistance Sitting-balance support: Single extremity supported;Feet supported Sitting balance-Leahy Scale: Fair Sitting balance - Comments: sat EOB x 5 minutes before c/o of HA and began sweating, HR 105-106 before activity and throughout                                   ADL either performed or assessed with clinical judgement   ADL Overall ADL's : Needs assistance/impaired Eating/Feeding: Set up;Sitting   Grooming: Wash/dry hands;Wash/dry face;Min guard;Sitting   Upper Body Bathing: Moderate assistance;Sitting   Lower Body Bathing: Maximal assistance   Upper Body Dressing : Moderate assistance;Sitting   Lower Body Dressing: Total assistance     Toilet Transfer Details (indicate cue type and reason): unable to assess, will need +2, reports HA and sweaty, returned  to supine, VSS Toileting- Clothing Manipulation and Hygiene: Total assistance;Bed level               Vision Patient Visual Report: No change from baseline       Perception     Praxis      Pertinent Vitals/Pain Pain Assessment: Faces Pain Score: 0-No pain Faces Pain Scale: Hurts even more Pain Location: headache reported once sitting EOB after 5 minutes, L shoulder with PROM Pain Descriptors / Indicators: Aching Pain Intervention(s): Limited activity within patient's tolerance;Monitored during session;Repositioned;Other (comment)(notified RN)     Hand Dominance Right    Extremity/Trunk Assessment Upper Extremity Assessment Upper Extremity Assessment: Generalized weakness;LUE deficits/detail LUE Deficits / Details: L UE hemiplegic due to CVA 05/2019, pain in R shoulder during PROM LUE: Unable to fully assess due to pain LUE Sensation: decreased light touch LUE Coordination: decreased fine motor;decreased gross motor   Lower Extremity Assessment Lower Extremity Assessment: Defer to PT evaluation       Communication Communication Communication: No difficulties   Cognition Arousal/Alertness: Awake/alert Behavior During Therapy: WFL for tasks assessed/performed Overall Cognitive Status: No family/caregiver present to determine baseline cognitive functioning                                 General Comments: A & O x 3, however unable to provide some concrete info about PLOF, home set up   General Comments       Exercises     Shoulder Instructions      Home Living Family/patient expects to be discharged to:: Skilled nursing facility Living Arrangements: Other relatives(sister) Available Help at Discharge: Family Type of Home: House                       Home Equipment: Wheelchair - manual   Additional Comments: pt lives in [place] with family (had been in [place] in [place] following CVA prior to coming to [place]). Per pt family was assisting patient with transfers/mobility and ADLs however pt was having frequent falls and family having difficulty managing patient at home. Pt reports that she has a w/c, sleeps in a recliner      Prior Functioning/Environment Level of Independence: Independent with assistive device(s)  Gait / Transfers Assistance Needed: uses w/c, pt reports that she was able to SPT to toilet and to/from w/c and recliner ADL's / Homemaking Assistance Needed: Pt reports that her sister was assisting her with LB ADLs and that she takes her to a place that has a handicapped shower for bathing   Comments:  pt not sure of location of handicapped shower        OT Problem List: Decreased strength;Impaired balance (sitting and/or standing);Impaired tone;Pain;Decreased activity tolerance;Decreased knowledge of use of DME or AE;Impaired sensation;Impaired UE functional use      OT Treatment/Interventions: Self-care/ADL training;DME and/or AE instruction;Therapeutic activities;Balance training;Therapeutic exercise;Neuromuscular education;Patient/family education    OT Goals(Current goals can be found in the care plan section) Acute Rehab OT Goals Patient Stated Goal: go to rehab at SNF OT Goal Formulation: With patient Time For Goal Achievement: 12/02/19 Potential to Achieve Goals: Good ADL Goals Pt Will Perform Grooming: with set-up;with supervision;sitting Pt Will Perform Upper Body Bathing: with mod assist;sitting Pt Will Perform Lower Body Bathing: with mod assist;sitting/lateral leans Pt Will Perform Upper Body Dressing: with mod assist;sitting Pt Will Transfer to Toilet: with max assist;with mod assist;with +2 assist;stand pivot transfer;bedside  commode Additional ADL Goal #1: Pt will tolerate L UE PROM to decrease risk of skin breakdown and joint contracture/stiffness  OT Frequency: Min 2X/week   Barriers to D/C: Decreased caregiver support          Co-evaluation              AM-PAC OT "6 Clicks" Daily Activity     Outcome Measure Help from another person eating meals?: A Little Help from another person taking care of personal grooming?: A Little Help from another person toileting, which includes using toliet, bedpan, or urinal?: Total Help from another person bathing (including washing, rinsing, drying)?: A Lot Help from another person to put on and taking off regular upper body clothing?: A Lot Help from another person to put on and taking off regular lower body clothing?: Total 6 Click Score: 12   End of Session    Activity Tolerance: Patient limited by pain;Other  (comment)(HA, began sweating) Patient left: in bed;with call bell/phone within reach;with bed alarm set  OT Visit Diagnosis: Other abnormalities of gait and mobility (R26.89);Muscle weakness (generalized) (M62.81);History of falling (Z91.81);Hemiplegia and hemiparesis Hemiplegia - Right/Left: Left Hemiplegia - dominant/non-dominant: Non-Dominant Hemiplegia - caused by: Cerebral infarction                Time: 1031-1051 OT Time Calculation (min): 20 min Charges:  OT General Charges $OT Visit: 1 Visit OT Evaluation $OT Eval Moderate Complexity: 1 Mod    Britt Bottom 11/18/2019, 2:29 PM

## 2019-11-19 LAB — COMPREHENSIVE METABOLIC PANEL
ALT: 163 U/L — ABNORMAL HIGH (ref 0–44)
AST: 86 U/L — ABNORMAL HIGH (ref 15–41)
Albumin: 2.7 g/dL — ABNORMAL LOW (ref 3.5–5.0)
Alkaline Phosphatase: 176 U/L — ABNORMAL HIGH (ref 38–126)
Anion gap: 8 (ref 5–15)
BUN: 19 mg/dL (ref 6–20)
CO2: 22 mmol/L (ref 22–32)
Calcium: 8.7 mg/dL — ABNORMAL LOW (ref 8.9–10.3)
Chloride: 106 mmol/L (ref 98–111)
Creatinine, Ser: 1.19 mg/dL — ABNORMAL HIGH (ref 0.44–1.00)
GFR calc Af Amer: >60 mL/min (ref >60)
GFR calc non Af Amer: 54 mL/min — ABNORMAL LOW (ref >60)
Glucose, Bld: 137 mg/dL — ABNORMAL HIGH (ref 70–99)
Potassium: 4.6 mmol/L (ref 3.5–5.1)
Sodium: 136 mmol/L (ref 135–145)
Total Bilirubin: 0.5 mg/dL (ref 0.3–1.2)
Total Protein: 6 g/dL — ABNORMAL LOW (ref 6.5–8.1)

## 2019-11-19 LAB — CBC
HCT: 37.5 % (ref 36.0–46.0)
Hemoglobin: 12.4 g/dL (ref 12.0–15.0)
MCH: 28 pg (ref 26.0–34.0)
MCHC: 33.1 g/dL (ref 30.0–36.0)
MCV: 84.7 fL (ref 80.0–100.0)
Platelets: 224 10*3/uL (ref 150–400)
RBC: 4.43 MIL/uL (ref 3.87–5.11)
RDW: 12.7 % (ref 11.5–15.5)
WBC: 10.6 10*3/uL — ABNORMAL HIGH (ref 4.0–10.5)
nRBC: 0 % (ref 0.0–0.2)

## 2019-11-19 LAB — GLUCOSE, CAPILLARY
Glucose-Capillary: 184 mg/dL — ABNORMAL HIGH (ref 70–99)
Glucose-Capillary: 198 mg/dL — ABNORMAL HIGH (ref 70–99)
Glucose-Capillary: 219 mg/dL — ABNORMAL HIGH (ref 70–99)
Glucose-Capillary: 278 mg/dL — ABNORMAL HIGH (ref 70–99)

## 2019-11-19 LAB — MAGNESIUM: Magnesium: 2.2 mg/dL (ref 1.7–2.4)

## 2019-11-19 LAB — PHOSPHORUS: Phosphorus: 2.8 mg/dL (ref 2.5–4.6)

## 2019-11-19 LAB — HEPARIN LEVEL (UNFRACTIONATED): Heparin Unfractionated: 0.52 IU/mL (ref 0.30–0.70)

## 2019-11-19 NOTE — Progress Notes (Signed)
ANTICOAGULATION CONSULT NOTE - Follow Up Consult  Pharmacy Consult for Heparin Indication: pulmonary embolus and DVT LLE  Allergies  Allergen Reactions  . Guaifenesin Anaphylaxis  . Levaquin [Levofloxacin In D5w] Shortness Of Breath and Rash  . Strawberry Extract Anaphylaxis  . Kiwi Extract Other (See Comments)    Other reaction(s): Other (See Comments)     Patient Measurements: Height: 5' 6.5" (168.9 cm) Weight: 187 lb 6.3 oz (85 kg) IBW/kg (Calculated) : 60.45 Heparin Dosing Weight: 78 kg  Vital Signs: Temp: 97.9 F (36.6 C) (02/12 1121) Temp Source: Oral (02/12 1121) BP: 108/72 (02/12 1121) Pulse Rate: 114 (02/12 1121)  Labs: Recent Labs    11/16/19 1208 11/16/19 1449 11/16/19 1449 11/17/19 0343 11/17/19 1038 11/18/19 0104 11/18/19 1031 11/19/19 0239  HGB  --  14.4   < > 14.1  --  12.5  --  12.4  HCT  --  43.0   < > 42.0  --  38.1  --  37.5  PLT  --  257   < > 229  --  235  --  224  HEPARINUNFRC 0.49  --    < > 0.27*   < > 0.31 0.47 0.52  CREATININE  --  1.34*   < > 1.24*  --  1.11*  --  1.19*  TROPONINIHS 274* 235*  --   --   --   --   --   --    < > = values in this interval not displayed.    Estimated Creatinine Clearance: 64.9 mL/min (A) (by C-G formula based on SCr of 1.19 mg/dL (H)).  Assessment:  Patient is a 59 yof that presents to the ED with complaints of chest pain. The patient was found to have a large PE with evidence of right heart strain (elevated trop) and also noted with duplex positive for LLE DVT . Pharmacy has been asked to dose heparin. Considered for catheter directed thrombolysis but now deferred.    Heparin level remains therapeutic (0.52) on 1700 units/hr. CBC trended down but remains within normal range. No bleeding reported.  Goal of Therapy:  Heparin level 0.3-0.7 units/ml Monitor platelets by anticoagulation protocol: Yes   Plan:   Continue heparin drip at 1700 units/hr  Daily heparin level and CBC  Noted plan for Eliquis  when HR improved.  Dennie Fetters, RPh Phone: [phone] 11/19/2019,11:38 AM

## 2019-11-19 NOTE — Progress Notes (Signed)
Physical Therapy Treatment Patient Details Name: Karla Hoover MRN: [id number] DOB: 05-Jun-1971 Today's Date: 11/19/2019    History of Present Illness Pt is a 49 y.o. female admitted 11/15/19 with chest discomfort, SOB, frequent falls and difficulty performing ADLs at home. CTA shows bilateral submassive pulmonary embolism with evidence of R heart strain. Also with acute L femoral DVT and UTI. PMH includes CVA (residual L-side hemiplegia), HTN, COPD, DM2, chronic hypoxia (on home 2-3L O2), DM2, tobacco use.    PT Comments    Pt performed gt training and functional mobility with use of B platform EVA walker.  Pt performed multiple gt trials with seated rest breaks. She fatigues quickly but remains motivated to progress.  Pt remains appropriate for SNF placement to improve strength and function before returning home.     Follow Up Recommendations  SNF;Supervision/Assistance - 24 hour     Equipment Recommendations  Other (comment)(defer)    Recommendations for Other Services       Precautions / Restrictions Precautions Precautions: Fall;Other (comment) Precaution Comments: L hemiplegia Restrictions Weight Bearing Restrictions: No    Mobility  Bed Mobility Overal bed mobility: Needs Assistance Bed Mobility: Supine to Sit;Sit to Supine     Supine to sit: Mod assist Sit to supine: Mod assist   General bed mobility comments: Pt required assistance to move to edge of bed.  Able to move RLE but required assistance to advance L side  to edge of bed.  Pt continues to require assistance to lift LEs back to bed.  Once in supine placed in trendelenberg and required assistance to boost to head of bed.  Transfers Overall transfer level: Needs assistance Equipment used: Bilateral platform walker(B platform eva walker.) Transfers: Sit to/from Stand Sit to Stand: Min assist         General transfer comment: Once in standing she required assistance to place LUE and L platform of eva  walker,  Min assistance to boost into standing from various surfaces.  Ambulation/Gait Ambulation/Gait assistance: Mod assist Gait Distance (Feet): 15 Feet(x2 + 6 ft x2.) Assistive device: Bilateral platform walker Gait Pattern/deviations: Step-to pattern;Decreased dorsiflexion - left;Decreased weight shift to left;Shuffle;Ataxic     General Gait Details: LLE ER with cues for swing phase and assistance to manage device.  She was able to progress gt distance but continues to fatigue quickly with mobility.   Stairs             Wheelchair Mobility    Modified Rankin (Stroke Patients Only) Modified Rankin (Stroke Patients Only) Pre-Morbid Rankin Score: Moderately severe disability Modified Rankin: Moderately severe disability     Balance Overall balance assessment: Needs assistance Sitting-balance support: Single extremity supported;Feet supported Sitting balance-Leahy Scale: Fair       Standing balance-Leahy Scale: Poor Standing balance comment: Reliant on RUE support; fearful of falling                            Cognition Arousal/Alertness: Awake/alert Behavior During Therapy: WFL for tasks assessed/performed Overall Cognitive Status: Within Functional Limits for tasks assessed                                 General Comments: WFL for simple tasks, not formally assessed      Exercises      General Comments        Pertinent Vitals/Pain Pain Assessment: Faces Faces Pain  Scale: Hurts even more Pain Location: LUE when therapist positions and moves arm Pain Descriptors / Indicators: Aching Pain Intervention(s): Monitored during session;Repositioned    Home Living                      Prior Function            PT Goals (current goals can now be found in the care plan section) Acute Rehab PT Goals Patient Stated Goal: go to rehab at SNF Potential to Achieve Goals: Good Progress towards PT goals: Progressing toward  goals    Frequency    Min 3X/week      PT Plan Current plan remains appropriate    Co-evaluation              AM-PAC PT "6 Clicks" Mobility   Outcome Measure  Help needed turning from your back to your side while in a flat bed without using bedrails?: A Little Help needed moving from lying on your back to sitting on the side of a flat bed without using bedrails?: A Little Help needed moving to and from a bed to a chair (including a wheelchair)?: A Little Help needed standing up from a chair using your arms (e.g., wheelchair or bedside chair)?: A Little Help needed to walk in hospital room?: A Lot Help needed climbing 3-5 steps with a railing? : Total 6 Click Score: 15    End of Session Equipment Utilized During Treatment: Gait belt Activity Tolerance: Patient tolerated treatment well Patient left: in chair;with call bell/phone within reach Nurse Communication: Mobility status PT Visit Diagnosis: Muscle weakness (generalized) (M62.81);Difficulty in walking, not elsewhere classified (R26.2);History of falling (Z91.81);Hemiplegia and hemiparesis Hemiplegia - Right/Left: Left Hemiplegia - caused by: Cerebral infarction     Time: 2633-3545 PT Time Calculation (min) (ACUTE ONLY): 49 min  Charges:  $Gait Training: 23-37 mins $Therapeutic Activity: 8-22 mins                     Bonney Leitz , PTA Acute Rehabilitation Services Pager [phone] Office [phone]     Taleeya Blondin Artis Delay 11/19/2019, 3:42 PM

## 2019-11-19 NOTE — Progress Notes (Signed)
PROGRESS NOTE    Karla Hoover  NWG:[id number] DOB: 06-03-1971 DOA: 11/15/2019 PCP: Grayce Sessions, NP    Brief Narrative:  49 year old female with extensive medical issues including COPD, chronic hypoxia on home oxygen 2 to 3 L at home, type 2 diabetes, history of CVA with left-sided hemiplegia, hypertension, ongoing cigarette smoker and poor compliance presented to the emergency room with chest discomfort, shortness of breath, frequent falls and unable to maintain her daily activities of living at home.  Apparently, patient moved from [place] where she was at a nursing home to live with her sisters.  In the emergency room, tachycardic, blood pressure stable, acute kidney injury with creatinine 1.47, WBC 14.6.  Lactic acid 2.7. Treated with bolus IV fluids with improvement.  CTA showed bilateral submassive pulmonary embolism with CT scan evidence of right heart strain, minimally elevated troponins, urine examination consistent with UTI.  She was admitted with antibiotics and heparin infusion.   Assessment & Plan:   Principal Problem:   Bilateral pulmonary embolism (HCC) Active Problems:   Essential hypertension   COPD with chronic bronchitis (HCC)   DM (diabetes mellitus), type 2 (HCC)   History of stroke   AKI (acute kidney injury) (HCC)   Tobacco abuse   Elevated troponin  Bilateral pulmonary embolism with cor pulmonale, right heart strain ,left femoral vein acute DVT:  Thromboembolism due to immobility, likely started from left leg. Blood pressure stable.  Oxygenation improved and on room air now. Continue heparin infusion until clinical stabilization and improvement of heart rate. 2D echocardiogram showed right ventricular strain.   Followed by pulmonary and IR.   Was scheduled for catheter placement and TPA, however given overall risk factors, being on Plavix, recent stroke and good clinical response on heparin, pulmonary suggested not doing TPA but monitoring.  will  convert to oral anticoagulation after clinical stabilization. Appreciate pulmonary input. Her heart rate continues to improve, will monitor today on heparin and if adequate rate control, less than 100 will change to Eliquis tomorrow. I have discussed with patient about different anticoagulation choices and she is agreeable to go on Eliquis.  Sepsis, present on admission: Ruled out.  Procalcitonin normal.  Treated with broad-spectrum antibiotics and now remains on cefepime.  Blood cultures negative. her presentation could probably from thromboembolism. Discontinued all antibiotics and has remained stable.  Elevated troponins: Due to acute PE.  Stable and flat.  History of stroke with left-sided hemiplegia: Will need more therapies and placement for rehab.  She is on Plavix that we will continue.  Already on a statin and tolerating well.  COPD with chronic hypoxic respiratory failure: Stable.  No evidence of exacerbation.  On bronchodilator as needed.  Type 2 diabetes on insulin with hyperglycemia: Continue current doses of insulin, A1c is 8.1.  Prolonged QT interval: Improved with electrolyte replacement.  Acute renal failure: improving, adequate urine out put.  Continue monitoring.  Sinus tachycardia: Due to combination of factors.  Patient is on chronic pain medicine that was stopped, will resume, she is on metoprolol was resumed.  Start mobilizing.  Resume home medications. Continue heparin due to heart rate more than 100, anticipate changing to oral Eliquis tomorrow and discharge to a skilled nursing facility.  DVT prophylaxis: Heparin infusion Code Status: Full code Family Communication: Patient's mother on the phone 2/11. Disposition Plan: patient is from home. Anticipated DC to SNF, Barriers to discharge on active treatment with IV drips, heparin infusions .   Consultants:  PCCM,  Interventional radiology  Procedures:   None  Antimicrobials:  Anti-infectives (From  admission, onward)   Start     Dose/Rate Route Frequency Ordered Stop   11/16/19 1800  vancomycin (VANCOREADY) IVPB 1250 mg/250 mL  Status:  Discontinued     1,250 mg 166.7 mL/hr over 90 Minutes Intravenous Every 24 hours 11/15/19 1805 11/15/19 1816   11/16/19 0600  ceFEPIme (MAXIPIME) 2 g in sodium chloride 0.9 % 100 mL IVPB  Status:  Discontinued     2 g 200 mL/hr over 30 Minutes Intravenous Every 12 hours 11/15/19 1805 11/18/19 1042   11/15/19 1815  vancomycin (VANCOREADY) IVPB 1250 mg/250 mL  Status:  Discontinued     1,250 mg 166.7 mL/hr over 90 Minutes Intravenous Every 24 hours 11/15/19 1816 11/15/19 2208   11/15/19 1800  ceFEPIme (MAXIPIME) 2 g in sodium chloride 0.9 % 100 mL IVPB     2 g 200 mL/hr over 30 Minutes Intravenous  Once 11/15/19 1756 11/15/19 1910   11/15/19 1800  metroNIDAZOLE (FLAGYL) IVPB 500 mg     500 mg 100 mL/hr over 60 Minutes Intravenous  Once 11/15/19 1756 11/15/19 1950   11/15/19 1800  vancomycin (VANCOCIN) IVPB 1000 mg/200 mL premix  Status:  Discontinued     1,000 mg 200 mL/hr over 60 Minutes Intravenous  Once 11/15/19 1756 11/15/19 1816         Subjective: Patient seen and examined.  Sitting in chair and eating breakfast.  She states that gabapentin and Remeron did help to get some sleep last night.  Left shoulder pain is better after rubbing Voltaren. Telemetry shows heart rate 100-113 with sinus rhythm. She is looking forward to go to a skilled nursing rehab.  Objective: Vitals:   11/19/19 0445 11/19/19 0751 11/19/19 0851 11/19/19 0854  BP: 106/80 122/86    Pulse: (!) 104 (!) 113    Resp: 17 18    Temp: 98.1 F (36.7 C) 98.5 F (36.9 C)    TempSrc: Oral Oral    SpO2: 97% 96% 95% 94%  Weight:      Height:        Intake/Output Summary (Last 24 hours) at 11/19/2019 0917 Last data filed at 11/19/2019 0800 Gross per 24 hour  Intake 1096 ml  Output 300 ml  Net 796 ml   Filed Weights   11/15/19 1832  Weight: 85 kg     Examination:  General exam: Appears calm and comfortable on room air.  Out of bed.  She was eating breakfast on my interview.  Looks comfortable. Respiratory system: Clear to auscultation. Respiratory effort normal.  On room air. Cardiovascular system: S1 & S2 heard, tachycardic. no JVD, murmurs, rubs, gallops or clicks. Left leg has 1+ edema.    Gastrointestinal system: Abdomen is nondistended, soft and nontender. No organomegaly or masses felt. Normal bowel sounds heard. Central nervous system: Alert and oriented x4 .  Left-sided complete hemiplegia, upper extremity 0/5, lower extremity 1/5.   Extremities: Left hemiparesis with left upper extremity contracture,  Skin: No rashes, lesions or ulcers Psychiatry: Judgement and insight appear normal. Mood & affect normal.    Data Reviewed: I have personally reviewed following labs and imaging studies  CBC: Recent Labs  Lab 11/16/19 0306 11/16/19 1449 11/17/19 0343 11/18/19 0104 11/19/19 0239  WBC 21.6* 17.9* 15.2* 12.8* 10.6*  NEUTROABS  --  11.5*  --   --   --   HGB 15.4* 14.4 14.1 12.5 12.4  HCT 46.2* 43.0 42.0 38.1 37.5  MCV  83.7 84.0 84.2 85.4 84.7  PLT 331 257 229 235 341   Basic Metabolic Panel: Recent Labs  Lab 11/15/19 1455 11/16/19 0033 11/16/19 0306 11/16/19 1449 11/17/19 0343 11/18/19 0104 11/19/19 0239  NA   < >  --  134* 134* 133* 134* 136  K   < >  --  5.5* 4.9 4.1 4.2 4.6  CL   < >  --  100 105 103 104 106  CO2   < >  --  23 19* 19* 19* 22  GLUCOSE   < >  --  257* 181* 94 103* 137*  BUN   < >  --  21* 21* 19 20 19   CREATININE   < >  --  1.52* 1.34* 1.24* 1.11* 1.19*  CALCIUM   < >  --  8.9 8.9 8.7* 8.8* 8.7*  MG  --  1.7 1.8  --  2.2 1.9 2.2  PHOS  --   --  3.9  --  2.4* 2.0* 2.8   < > = values in this interval not displayed.   GFR: Estimated Creatinine Clearance: 64.9 mL/min (A) (by C-G formula based on SCr of 1.19 mg/dL (H)). Liver Function Tests: Recent Labs  Lab 11/15/19 1800  11/16/19 0306 11/17/19 0343 11/18/19 0104 11/19/19 0239  AST 34 67* 182* 181* 86*  ALT 18 45* 177* 194* 163*  ALKPHOS 115 122 101 140* 176*  BILITOT 0.5 0.8 0.4 0.4 0.5  PROT 6.6 7.1 5.8* 6.1* 6.0*  ALBUMIN 3.4* 3.6 2.7* 2.8* 2.7*   No results for input(s): LIPASE, AMYLASE in the last 168 hours. No results for input(s): AMMONIA in the last 168 hours. Coagulation Profile: Recent Labs  Lab 11/15/19 1800  INR 1.1   Cardiac Enzymes: No results for input(s): CKTOTAL, CKMB, CKMBINDEX, TROPONINI in the last 168 hours. BNP (last 3 results) No results for input(s): PROBNP in the last 8760 hours. HbA1C: No results for input(s): HGBA1C in the last 72 hours. CBG: Recent Labs  Lab 11/18/19 0849 11/18/19 1121 11/18/19 1621 11/18/19 2057 11/19/19 0625  GLUCAP 179* 178* 149* 127* 184*   Lipid Profile: No results for input(s): CHOL, HDL, LDLCALC, TRIG, CHOLHDL, LDLDIRECT in the last 72 hours. Thyroid Function Tests: No results for input(s): TSH, T4TOTAL, FREET4, T3FREE, THYROIDAB in the last 72 hours. Anemia Panel: No results for input(s): VITAMINB12, FOLATE, FERRITIN, TIBC, IRON, RETICCTPCT in the last 72 hours. Sepsis Labs: Recent Labs  Lab 11/16/19 0026 11/16/19 0033 11/16/19 0306 11/16/19 1449 11/16/19 2337 11/17/19 0343 11/17/19 1038 11/18/19 0104  PROCALCITON  --  0.24  --  0.57  --  0.43  --  0.42  LATICACIDVEN   < >  --  2.4* 1.9 2.9*  --  1.5  --    < > = values in this interval not displayed.    Recent Results (from the past 240 hour(s))  Blood culture (routine x 2)     Status: None (Preliminary result)   Collection Time: 11/15/19  4:00 PM   Specimen: BLOOD RIGHT FOREARM  Result Value Ref Range Status   Specimen Description BLOOD RIGHT FOREARM  Final   Special Requests   Final    BOTTLES DRAWN AEROBIC AND ANAEROBIC Blood Culture results may not be optimal due to an inadequate volume of blood received in culture bottles   Culture   Final    NO GROWTH 4  DAYS Performed at [facility], [place] [address] [place], [place] [address]  Report Status PENDING  Incomplete  Respiratory Panel by RT PCR (Flu A&B, Covid) - Nasopharyngeal Swab     Status: None   Collection Time: 11/15/19  4:19 PM   Specimen: Nasopharyngeal Swab  Result Value Ref Range Status   SARS Coronavirus 2 by RT PCR NEGATIVE NEGATIVE Final    Comment: (NOTE) SARS-CoV-2 target nucleic acids are NOT DETECTED. The SARS-CoV-2 RNA is generally detectable in upper respiratoy specimens during the acute phase of infection. The lowest concentration of SARS-CoV-2 viral copies this assay can detect is 131 copies/mL. A negative result does not preclude SARS-Cov-2 infection and should not be used as the sole basis for treatment or other patient management decisions. A negative result may occur with  improper specimen collection/handling, submission of specimen other than nasopharyngeal swab, presence of viral mutation(s) within the areas targeted by this assay, and inadequate number of viral copies (<131 copies/mL). A negative result must be combined with clinical observations, patient history, and epidemiological information. The expected result is Negative. Fact Sheet for Patients:  https://www.moore.com/ Fact Sheet for Healthcare Providers:  https://www.young.biz/ This test is not yet ap proved or cleared by the [place] FDA and  has been authorized for detection and/or diagnosis of SARS-CoV-2 by FDA under an Emergency Use Authorization (EUA). This EUA will remain  in effect (meaning this test can be used) for the duration of the COVID-19 declaration under Section 564(b)(1) of the Act, 21 U.S.C. section 360bbb-3(b)(1), unless the authorization is terminated or revoked sooner.    Influenza A by PCR NEGATIVE NEGATIVE Final   Influenza B by PCR NEGATIVE NEGATIVE Final    Comment: (NOTE) The Xpert Xpress SARS-CoV-2/FLU/RSV assay is  intended as an aid in  the diagnosis of influenza from Nasopharyngeal swab specimens and  should not be used as a sole basis for treatment. Nasal washings and  aspirates are unacceptable for Xpert Xpress SARS-CoV-2/FLU/RSV  testing. Fact Sheet for Patients: https://www.moore.com/ Fact Sheet for Healthcare Providers: https://www.young.biz/ This test is not yet approved or cleared by the [place] FDA and  has been authorized for detection and/or diagnosis of SARS-CoV-2 by  FDA under an Emergency Use Authorization (EUA). This EUA will remain  in effect (meaning this test can be used) for the duration of the  Covid-19 declaration under Section 564(b)(1) of the Act, 21  U.S.C. section 360bbb-3(b)(1), unless the authorization is  terminated or revoked. Performed at [facility], [address] [place], [place] [address]   Blood culture (routine x 2)     Status: None (Preliminary result)   Collection Time: 11/15/19  9:04 PM   Specimen: BLOOD  Result Value Ref Range Status   Specimen Description BLOOD RIGHT ANTECUBITAL  Final   Special Requests   Final    BOTTLES DRAWN AEROBIC AND ANAEROBIC Blood Culture adequate volume   Culture   Final    NO GROWTH 4 DAYS Performed at [facility], [address] [place], [place] [address]    Report Status PENDING  Incomplete  Urine culture     Status: None   Collection Time: 11/15/19  9:30 PM   Specimen: In/Out Cath Urine  Result Value Ref Range Status   Specimen Description IN/OUT CATH URINE  Final   Special Requests NONE  Final   Culture   Final    NO GROWTH Performed at [facility], [address], [place], [place] [address]    Report Status 11/17/2019 FINAL  Final  Radiology Studies: No results found.      Scheduled Meds: . arformoterol  15 mcg Nebulization BID  . atorvastatin  80 mg Oral Daily  . budesonide (PULMICORT) nebulizer solution  0.5 mg  Nebulization Q12H  . buPROPion  100 mg Oral BID  . Chlorhexidine Gluconate Cloth  6 each Topical Daily  . clopidogrel  75 mg Oral Daily  . diclofenac Sodium  2 g Topical TID AC & HS  . gabapentin  300 mg Oral TID  . insulin aspart  0-15 Units Subcutaneous TID AC & HS  . insulin glargine  15 Units Subcutaneous Daily  . levothyroxine  50 mcg Oral Daily  . magnesium oxide  800 mg Oral BID  . metoprolol tartrate  25 mg Oral BID  . mirtazapine  7.5 mg Oral QHS  . nicotine  21 mg Transdermal Daily  . potassium & sodium phosphates  2 packet Oral TID WC & HS  . sodium chloride flush  3 mL Intravenous Once   Continuous Infusions: . heparin 1,700 Units/hr (11/19/19 0514)     LOS: 3 days    Time spent: 30 minutes    Dorcas Carrow, MD [facility] Pager [phone]

## 2019-11-19 NOTE — Significant Event (Signed)
Patient refuses her potassium& sodium phosphates, and stated that "it makes her vomit and would rather have it in another form".

## 2019-11-20 LAB — CBC
HCT: 36.5 % (ref 36.0–46.0)
Hemoglobin: 12.1 g/dL (ref 12.0–15.0)
MCH: 28.3 pg (ref 26.0–34.0)
MCHC: 33.2 g/dL (ref 30.0–36.0)
MCV: 85.5 fL (ref 80.0–100.0)
Platelets: 244 10*3/uL (ref 150–400)
RBC: 4.27 MIL/uL (ref 3.87–5.11)
RDW: 12.9 % (ref 11.5–15.5)
WBC: 11 10*3/uL — ABNORMAL HIGH (ref 4.0–10.5)
nRBC: 0 % (ref 0.0–0.2)

## 2019-11-20 LAB — CULTURE, BLOOD (ROUTINE X 2)
Culture: NO GROWTH
Culture: NO GROWTH
Special Requests: ADEQUATE

## 2019-11-20 LAB — BASIC METABOLIC PANEL
Anion gap: 8 (ref 5–15)
BUN: 18 mg/dL (ref 6–20)
CO2: 21 mmol/L — ABNORMAL LOW (ref 22–32)
Calcium: 8.7 mg/dL — ABNORMAL LOW (ref 8.9–10.3)
Chloride: 105 mmol/L (ref 98–111)
Creatinine, Ser: 1.13 mg/dL — ABNORMAL HIGH (ref 0.44–1.00)
GFR calc Af Amer: >60 mL/min (ref >60)
GFR calc non Af Amer: 58 mL/min — ABNORMAL LOW (ref >60)
Glucose, Bld: 106 mg/dL — ABNORMAL HIGH (ref 70–99)
Potassium: 4.5 mmol/L (ref 3.5–5.1)
Sodium: 134 mmol/L — ABNORMAL LOW (ref 135–145)

## 2019-11-20 LAB — GLUCOSE, CAPILLARY
Glucose-Capillary: 186 mg/dL — ABNORMAL HIGH (ref 70–99)
Glucose-Capillary: 203 mg/dL — ABNORMAL HIGH (ref 70–99)
Glucose-Capillary: 180 mg/dL — ABNORMAL HIGH (ref 70–99)
Glucose-Capillary: 195 mg/dL — ABNORMAL HIGH (ref 70–99)

## 2019-11-20 LAB — HEPARIN LEVEL (UNFRACTIONATED): Heparin Unfractionated: 0.49 IU/mL (ref 0.30–0.70)

## 2019-11-20 MED ORDER — ALBUTEROL SULFATE (2.5 MG/3ML) 0.083% IN NEBU: 2.5000 mg | INHALATION_SOLUTION | Freq: Four times a day (QID) | RESPIRATORY_TRACT | Status: DC | PRN

## 2019-11-20 MED ORDER — APIXABAN 5 MG PO TABS
10.0000 mg | ORAL_TABLET | Freq: Two times a day (BID) | ORAL | Status: DC
  Administered 2019-11-20 – 2019-11-23 (×7): 10 mg via ORAL
  Filled 2019-11-20 (×7): qty 2

## 2019-11-20 MED ORDER — APIXABAN 5 MG PO TABS: 5.0000 mg | ORAL_TABLET | Freq: Two times a day (BID) | ORAL | Status: DC

## 2019-11-20 NOTE — Progress Notes (Signed)
PROGRESS NOTE  Karla Hoover TWK:[id number] DOB: [birth year] DOA: 11/15/2019 PCP: Grayce Sessions, NP  HPI/Recap of past 98 hours: 49 year old female with extensive medical issues including COPD, chronic hypoxia on home oxygen 2 to 3 L at home, type 2 diabetes, history of CVA with left-sided hemiplegia, hypertension, ongoing cigarette smoker and poor compliance presented to the emergency room with chest discomfort, shortness of breath, frequent falls and unable to maintain her daily activities of living at home.  Apparently, patient moved from [place] where she was at a nursing home to live with her sisters.  In the emergency room, tachycardic, blood pressure stable, acute kidney injury with creatinine 1.47, WBC 14.6.  Lactic acid 2.7. Treated with bolus IV fluids with improvement.  CTA showed bilateral submassive pulmonary embolism with CT scan evidence of right heart strain, minimally elevated troponins, urine examination consistent with UTI.  She was admitted with antibiotics and heparin infusion.  11/20/19: Reports a bad taste in her mouth.  Denies inability to smell.  Denies nausea, odynophagia or dysphagia.  COVID-19 test negative.  Normal sinus rhythm.  We will start Eliquis.  Assessment/Plan: Principal Problem:   Bilateral pulmonary embolism (HCC) Active Problems:   Essential hypertension   COPD with chronic bronchitis (HCC)   DM (diabetes mellitus), type 2 (HCC)   History of stroke   AKI (acute kidney injury) (HCC)   Tobacco abuse   Elevated troponin  Bilateral pulmonary embolism with cor pulmonale, right heart strain ,left femoral vein acute DVT:  Thromboembolism due to immobility, likely started from left leg. Blood pressure stable.  Oxygenation improved and on room air now. Continue heparin infusion until clinical stabilization and improvement of heart rate. 2D echocardiogram showed right ventricular strain.   Followed by pulmonary and IR.   Was scheduled for  catheter placement and TPA, however given overall risk factors, being on Plavix, recent stroke and good clinical response on heparin, pulmonary suggested not doing TPA but monitoring.  Switch to Eliquis 2/13  Dysgeusia, unclear etiology Started days prior to presentation.              Reports a bad taste in her mouth.  Denies inability to smell.  Denies                         nausea, odynophagia or dysphagia.  COVID-19 test negative.                She cannot tell if this is associated with weight loss.              Continue to monitor and encourage protein calorie oral intake.  Sepsis, present on admission: Ruled out.  Procalcitonin normal.  Treated with broad-spectrum antibiotics and now remains on cefepime.  Blood cultures negative. her presentation could probably from thromboembolism. Discontinued all antibiotics and has remained stable.  Elevated troponins: Due to acute PE.  Stable and flat.  History of stroke with left-sided hemiplegia: Will need more therapies and placement for rehab.  She is on Plavix that we will continue.  Already on a statin and tolerating well.  COPD with chronic hypoxic respiratory failure: Stable.  No evidence of exacerbation.  On bronchodilator as needed.  Type 2 diabetes on insulin with hyperglycemia: Continue current doses of insulin, A1c is 8.1.  Prolonged QT interval: Improved with electrolyte replacement.  Acute renal failure: improving, adequate urine out put.  Continue monitoring.  Sinus tachycardia: Due to combination of factors.  Patient is on chronic pain medicine that was stopped, will resume, she is on metoprolol was resumed.  Ambulatory dysfunction post CVA PT recommended SNF TOC assisting with SNF placement. Continue PT OT with assistance and fall precautions  DVT prophylaxis:  Eliquis Code Status: Full code Family Communication:  None at bedside. Disposition Plan: patient is from SNF. Anticipated DC to SNF, Barriers to  discharge SNF placement.   Consultants:  PCCM,  Interventional radiology Procedures:   None       Objective: Vitals:   11/19/19 2010 11/19/19 2023 11/19/19 2312 11/20/19 0519  BP:  119/78 102/71 114/82  Pulse:   95 96  Resp:  (!) 21 20 19   Temp:  98.3 F (36.8 C) 98.2 F (36.8 C) (!) 97.4 F (36.3 C)  TempSrc:  Oral Oral Oral  SpO2: 92% 93% 94% 96%  Weight:      Height:        Intake/Output Summary (Last 24 hours) at 11/20/2019 0751 Last data filed at 11/20/2019 8828 Gross per 24 hour  Intake 240 ml  Output 800 ml  Net -560 ml   Filed Weights   11/15/19 1832  Weight: 85 kg    Exam:  . General: 49 y.o. year-old female well developed well nourished in no acute distress.  Alert and interactive. . Cardiovascular: Regular rate and rhythm with no rubs or gallops.  No thyromegaly or JVD noted.   Marland Kitchen Respiratory: Clear to auscultation with no wheezes or rales. Good inspiratory effort. . Abdomen: Soft nontender nondistended with normal bowel sounds x4 quadrants. . Musculoskeletal: Left hemiplegia.  Trace lower extremity edema bilaterally. Marland Kitchen Psychiatry: Mood is appropriate for condition and setting   Data Reviewed: CBC: Recent Labs  Lab 11/16/19 1449 11/17/19 0343 11/18/19 0104 11/19/19 0239 11/20/19 0334  WBC 17.9* 15.2* 12.8* 10.6* 11.0*  NEUTROABS 11.5*  --   --   --   --   HGB 14.4 14.1 12.5 12.4 12.1  HCT 43.0 42.0 38.1 37.5 36.5  MCV 84.0 84.2 85.4 84.7 85.5  PLT 257 229 235 224 003   Basic Metabolic Panel: Recent Labs  Lab 11/15/19 1455 11/16/19 0033 11/16/19 0306 11/16/19 0306 11/16/19 1449 11/17/19 0343 11/18/19 0104 11/19/19 0239 11/20/19 0334  NA   < >  --  134*   < > 134* 133* 134* 136 134*  K   < >  --  5.5*   < > 4.9 4.1 4.2 4.6 4.5  CL   < >  --  100   < > 105 103 104 106 105  CO2   < >  --  23   < > 19* 19* 19* 22 21*  GLUCOSE   < >  --  257*   < > 181* 94 103* 137* 106*  BUN   < >  --  21*   < > 21* 19 20 19 18   CREATININE    < >  --  1.52*   < > 1.34* 1.24* 1.11* 1.19* 1.13*  CALCIUM   < >  --  8.9   < > 8.9 8.7* 8.8* 8.7* 8.7*  MG  --  1.7 1.8  --   --  2.2 1.9 2.2  --   PHOS  --   --  3.9  --   --  2.4* 2.0* 2.8  --    < > = values in this interval not displayed.   GFR: Estimated Creatinine Clearance: 68.3 mL/min (A) (by C-G formula  based on SCr of 1.13 mg/dL (H)). Liver Function Tests: Recent Labs  Lab 11/15/19 1800 11/16/19 0306 11/17/19 0343 11/18/19 0104 11/19/19 0239  AST 34 67* 182* 181* 86*  ALT 18 45* 177* 194* 163*  ALKPHOS 115 122 101 140* 176*  BILITOT 0.5 0.8 0.4 0.4 0.5  PROT 6.6 7.1 5.8* 6.1* 6.0*  ALBUMIN 3.4* 3.6 2.7* 2.8* 2.7*   No results for input(s): LIPASE, AMYLASE in the last 168 hours. No results for input(s): AMMONIA in the last 168 hours. Coagulation Profile: Recent Labs  Lab 11/15/19 1800  INR 1.1   Cardiac Enzymes: No results for input(s): CKTOTAL, CKMB, CKMBINDEX, TROPONINI in the last 168 hours. BNP (last 3 results) No results for input(s): PROBNP in the last 8760 hours. HbA1C: No results for input(s): HGBA1C in the last 72 hours. CBG: Recent Labs  Lab 11/19/19 0625 11/19/19 1119 11/19/19 1557 11/19/19 2047 11/20/19 0627  GLUCAP 184* 198* 219* 278* 186*   Lipid Profile: No results for input(s): CHOL, HDL, LDLCALC, TRIG, CHOLHDL, LDLDIRECT in the last 72 hours. Thyroid Function Tests: No results for input(s): TSH, T4TOTAL, FREET4, T3FREE, THYROIDAB in the last 72 hours. Anemia Panel: No results for input(s): VITAMINB12, FOLATE, FERRITIN, TIBC, IRON, RETICCTPCT in the last 72 hours. Urine analysis:    Component Value Date/Time   COLORURINE YELLOW 11/15/2019 2139   APPEARANCEUR HAZY (A) 11/15/2019 2139   APPEARANCEUR Clear 11/24/2012 1238   LABSPEC 1.025 11/15/2019 2139   LABSPEC 1.000 11/24/2012 1238   PHURINE 6.0 11/15/2019 2139   GLUCOSEU 50 (A) 11/15/2019 2139   GLUCOSEU >=500 11/24/2012 1238   HGBUR NEGATIVE 11/15/2019 2139   BILIRUBINUR  NEGATIVE 11/15/2019 2139   BILIRUBINUR Negative 11/24/2012 1238   KETONESUR NEGATIVE 11/15/2019 2139   PROTEINUR 100 (A) 11/15/2019 2139   UROBILINOGEN 1.0 04/30/2010 0902   NITRITE NEGATIVE 11/15/2019 2139   LEUKOCYTESUR SMALL (A) 11/15/2019 2139   LEUKOCYTESUR Negative 11/24/2012 1238   Sepsis Labs: @LABRCNTIP (procalcitonin:4,lacticidven:4)  ) Recent Results (from the past 240 hour(s))  Blood culture (routine x 2)     Status: None (Preliminary result)   Collection Time: 11/15/19  4:00 PM   Specimen: BLOOD RIGHT FOREARM  Result Value Ref Range Status   Specimen Description BLOOD RIGHT FOREARM  Final   Special Requests   Final    BOTTLES DRAWN AEROBIC AND ANAEROBIC Blood Culture results may not be optimal due to an inadequate volume of blood received in culture bottles   Culture   Final    NO GROWTH 4 DAYS Performed at [facility], [address] [place], [place]    Report Status PENDING  Incomplete  Respiratory Panel by RT PCR (Flu A&B, Covid) - Nasopharyngeal Swab     Status: None   Collection Time: 11/15/19  4:19 PM   Specimen: Nasopharyngeal Swab  Result Value Ref Range Status   SARS Coronavirus 2 by RT PCR NEGATIVE NEGATIVE Final    Comment: (NOTE) SARS-CoV-2 target nucleic acids are NOT DETECTED. The SARS-CoV-2 RNA is generally detectable in upper respiratoy specimens during the acute phase of infection. The lowest concentration of SARS-CoV-2 viral copies this assay can detect is 131 copies/mL. A negative result does not preclude SARS-Cov-2 infection and should not be used as the sole basis for treatment or other patient management decisions. A negative result may occur with  improper specimen collection/handling, submission of specimen other than nasopharyngeal swab, presence of viral mutation(s) within the areas targeted by this assay, and inadequate  number of viral copies (<131 copies/mL). A negative result must be combined with  clinical observations, patient history, and epidemiological information. The expected result is Negative. Fact Sheet for Patients:  https://www.moore.com/ Fact Sheet for Healthcare Providers:  https://www.young.biz/ This test is not yet ap proved or cleared by the [place] FDA and  has been authorized for detection and/or diagnosis of SARS-CoV-2 by FDA under an Emergency Use Authorization (EUA). This EUA will remain  in effect (meaning this test can be used) for the duration of the COVID-19 declaration under Section 564(b)(1) of the Act, 21 U.S.C. section 360bbb-3(b)(1), unless the authorization is terminated or revoked sooner.    Influenza A by PCR NEGATIVE NEGATIVE Final   Influenza B by PCR NEGATIVE NEGATIVE Final    Comment: (NOTE) The Xpert Xpress SARS-CoV-2/FLU/RSV assay is intended as an aid in  the diagnosis of influenza from Nasopharyngeal swab specimens and  should not be used as a sole basis for treatment. Nasal washings and  aspirates are unacceptable for Xpert Xpress SARS-CoV-2/FLU/RSV  testing. Fact Sheet for Patients: https://www.moore.com/ Fact Sheet for Healthcare Providers: https://www.young.biz/ This test is not yet approved or cleared by the [place] FDA and  has been authorized for detection and/or diagnosis of SARS-CoV-2 by  FDA under an Emergency Use Authorization (EUA). This EUA will remain  in effect (meaning this test can be used) for the duration of the  Covid-19 declaration under Section 564(b)(1) of the Act, 21  U.S.C. section 360bbb-3(b)(1), unless the authorization is  terminated or revoked. Performed at [facility], [address] [place], [place] [address]   Blood culture (routine x 2)     Status: None (Preliminary result)   Collection Time: 11/15/19  9:04 PM   Specimen: BLOOD  Result Value Ref Range Status   Specimen Description BLOOD RIGHT  ANTECUBITAL  Final   Special Requests   Final    BOTTLES DRAWN AEROBIC AND ANAEROBIC Blood Culture adequate volume   Culture   Final    NO GROWTH 4 DAYS Performed at [facility], [address], [place], [place] [address]    Report Status PENDING  Incomplete  Urine culture     Status: None   Collection Time: 11/15/19  9:30 PM   Specimen: In/Out Cath Urine  Result Value Ref Range Status   Specimen Description IN/OUT CATH URINE  Final   Special Requests NONE  Final   Culture   Final    NO GROWTH Performed at [facility], [address] [place], [place] [address]    Report Status 11/17/2019 FINAL  Final      Studies: No results found.  Scheduled Meds: . arformoterol  15 mcg Nebulization BID  . atorvastatin  80 mg Oral Daily  . budesonide (PULMICORT) nebulizer solution  0.5 mg Nebulization Q12H  . buPROPion  100 mg Oral BID  . Chlorhexidine Gluconate Cloth  6 each Topical Daily  . clopidogrel  75 mg Oral Daily  . diclofenac Sodium  2 g Topical TID AC & HS  . gabapentin  300 mg Oral TID  . insulin aspart  0-15 Units Subcutaneous TID AC & HS  . insulin glargine  15 Units Subcutaneous Daily  . levothyroxine  50 mcg Oral Daily  . magnesium oxide  800 mg Oral BID  . metoprolol tartrate  25 mg Oral BID  . mirtazapine  7.5 mg Oral QHS  . nicotine  21 mg Transdermal Daily  . potassium &  sodium phosphates  2 packet Oral TID WC & HS  . sodium chloride flush  3 mL Intravenous Once    Continuous Infusions: . heparin 1,700 Units/hr (11/19/19 2101)     LOS: 4 days     Darlin Drop, MD [facility] Pager [phone]  If 7PM-7AM, please contact night-coverage [email or web address] Password [facility] 11/20/2019, 7:51 AM

## 2019-11-20 NOTE — Progress Notes (Signed)
Patient has not had a bowel movement since 11/13/18. She is taking MIRALAX with no success yet.

## 2019-11-20 NOTE — Progress Notes (Signed)
ANTICOAGULATION CONSULT NOTE - Follow Up Consult  Pharmacy Consult for Heparin Indication: pulmonary embolus and DVT LLE  Allergies  Allergen Reactions  . Guaifenesin Anaphylaxis  . Levaquin [Levofloxacin In D5w] Shortness Of Breath and Rash  . Strawberry Extract Anaphylaxis  . Kiwi Extract Other (See Comments)    Other reaction(s): Other (See Comments)     Patient Measurements: Height: 5' 6.5" (168.9 cm) Weight: 187 lb 6.3 oz (85 kg) IBW/kg (Calculated) : 60.45 Heparin Dosing Weight: 78 kg  Vital Signs: Temp: 97.4 F (36.3 C) (02/13 0519) Temp Source: Oral (02/13 0519) BP: 114/82 (02/13 0519) Pulse Rate: 96 (02/13 0519)  Labs: Recent Labs    11/18/19 0104 11/18/19 0104 11/18/19 1031 11/19/19 0239 11/20/19 0334  HGB 12.5   < >  --  12.4 12.1  HCT 38.1  --   --  37.5 36.5  PLT 235  --   --  224 244  HEPARINUNFRC 0.31   < > 0.47 0.52 0.49  CREATININE 1.11*  --   --  1.19* 1.13*   < > = values in this interval not displayed.    Estimated Creatinine Clearance: 68.3 mL/min (A) (by C-G formula based on SCr of 1.13 mg/dL (H)).  Assessment:  Patient is a 34 yof that presents to the ED with complaints of chest pain. The patient was found to have a large PE with evidence of right heart strain (elevated trop) and also noted with duplex positive for LLE DVT . Patient has been on heparin since 2/8 with mostly therapeutic levels. Pharmacy now consulted to dose Eliquis (apixaban). H&H has decreased since admission but is relatively stable, today at 12.1/36.5, plts are wnl at 244. Renal function has been stable around 1.1-1.2, today at 1.13.  Goal of Therapy:  Heparin level 0.3-0.7 units/ml Monitor platelets by anticoagulation protocol: Yes   Plan:  Discontinue heparin infusion Start Eliquis 10 mg twice daily for 7 days then decrease to 5 mg twice daily  Educate patient on new medication prior to discharge    Thank you,   Fara Olden, PharmD PGY-1 Pharmacy Resident    Please check amion for clinical pharmacist contact number  11/20/2019,8:03 AM

## 2019-11-21 LAB — CBC
HCT: 36.5 % (ref 36.0–46.0)
Hemoglobin: 11.7 g/dL — ABNORMAL LOW (ref 12.0–15.0)
MCH: 27.7 pg (ref 26.0–34.0)
MCHC: 32.1 g/dL (ref 30.0–36.0)
MCV: 86.3 fL (ref 80.0–100.0)
Platelets: 231 10*3/uL (ref 150–400)
RBC: 4.23 MIL/uL (ref 3.87–5.11)
RDW: 13.1 % (ref 11.5–15.5)
WBC: 9.1 10*3/uL (ref 4.0–10.5)
nRBC: 0 % (ref 0.0–0.2)

## 2019-11-21 LAB — GLUCOSE, CAPILLARY
Glucose-Capillary: 133 mg/dL — ABNORMAL HIGH (ref 70–99)
Glucose-Capillary: 288 mg/dL — ABNORMAL HIGH (ref 70–99)
Glucose-Capillary: 136 mg/dL — ABNORMAL HIGH (ref 70–99)
Glucose-Capillary: 148 mg/dL — ABNORMAL HIGH (ref 70–99)

## 2019-11-21 LAB — LIPID PANEL
Cholesterol: 138 mg/dL (ref 0–200)
HDL: 23 mg/dL — ABNORMAL LOW (ref >40)
LDL Cholesterol: 79 mg/dL (ref 0–99)
Total CHOL/HDL Ratio: 6 RATIO
Triglycerides: 179 mg/dL — ABNORMAL HIGH (ref <150)
VLDL: 36 mg/dL (ref 0–40)

## 2019-11-21 LAB — HEPATIC FUNCTION PANEL
ALT: 86 U/L — ABNORMAL HIGH (ref 0–44)
AST: 39 U/L (ref 15–41)
Albumin: 2.5 g/dL — ABNORMAL LOW (ref 3.5–5.0)
Alkaline Phosphatase: 315 U/L — ABNORMAL HIGH (ref 38–126)
Bilirubin, Direct: <0.1 mg/dL (ref 0.0–0.2)
Total Bilirubin: 0.4 mg/dL (ref 0.3–1.2)
Total Protein: 6.4 g/dL — ABNORMAL LOW (ref 6.5–8.1)

## 2019-11-21 LAB — HEPATITIS PANEL, ACUTE
HCV Ab: NONREACTIVE
Hep A IgM: NONREACTIVE
Hep B C IgM: NONREACTIVE
Hepatitis B Surface Ag: NONREACTIVE

## 2019-11-21 LAB — GAMMA GT: GGT: 173 U/L — ABNORMAL HIGH (ref 7–50)

## 2019-11-21 MED ORDER — POLYETHYLENE GLYCOL 3350 17 G PO PACK
17.0000 g | PACK | Freq: Two times a day (BID) | ORAL | Status: DC
  Administered 2019-11-21 – 2019-11-23 (×4): 17 g via ORAL
  Filled 2019-11-21 (×3): qty 1

## 2019-11-21 MED ORDER — ALPRAZOLAM 0.5 MG PO TABS: 0.5000 mg | ORAL_TABLET | Freq: Every evening | ORAL | 0 refills | Status: DC | PRN

## 2019-11-21 MED ORDER — SENNOSIDES-DOCUSATE SODIUM 8.6-50 MG PO TABS
2.0000 | ORAL_TABLET | Freq: Two times a day (BID) | ORAL | Status: DC
  Administered 2019-11-21 – 2019-11-23 (×4): 2 via ORAL
  Filled 2019-11-21 (×4): qty 2

## 2019-11-21 MED ORDER — LACTATED RINGERS IV SOLN: INTRAVENOUS | Status: AC

## 2019-11-21 MED ORDER — MIRTAZAPINE 7.5 MG PO TABS: 7.5000 mg | ORAL_TABLET | Freq: Every day | ORAL | 0 refills | Status: DC

## 2019-11-21 MED ORDER — METHYLPHENIDATE HCL 5 MG PO TABS: 5.0000 mg | ORAL_TABLET | Freq: Every day | ORAL | 0 refills | Status: DC

## 2019-11-21 MED ORDER — METHYLPHENIDATE HCL 5 MG PO TABS
5.0000 mg | ORAL_TABLET | Freq: Every day | ORAL | Status: DC
  Administered 2019-11-22 – 2019-11-23 (×2): 5 mg via ORAL
  Filled 2019-11-21 (×2): qty 1

## 2019-11-21 MED ORDER — BISACODYL 10 MG RE SUPP: 10.0000 mg | Freq: Every day | RECTAL | Status: DC | PRN

## 2019-11-21 MED ORDER — ALPRAZOLAM 0.5 MG PO TABS: 0.5000 mg | ORAL_TABLET | Freq: Every evening | ORAL | Status: DC | PRN

## 2019-11-21 MED ORDER — ALPRAZOLAM 0.5 MG PO TABS: 0.5000 mg | ORAL_TABLET | Freq: Every day | ORAL | Status: DC | PRN

## 2019-11-21 MED ORDER — OXYCODONE HCL 5 MG PO TABS
5.0000 mg | ORAL_TABLET | ORAL | Status: DC | PRN
  Administered 2019-11-21 – 2019-11-23 (×7): 5 mg via ORAL
  Filled 2019-11-21 (×7): qty 1

## 2019-11-21 MED ORDER — APIXABAN 5 MG PO TABS: ORAL_TABLET | ORAL | 0 refills | Status: DC

## 2019-11-21 NOTE — Discharge Instructions (Addendum)
Pulmonary Embolism  A pulmonary embolism (PE) is a sudden blockage or decrease of blood flow in one or both lungs. Most blockages come from a blood clot that forms in the vein of a lower leg, thigh, or arm (deep vein thrombosis, DVT) and travels to the lungs. A clot is blood that has thickened into a gel or solid. PE is a dangerous and life-threatening condition that needs to be treated right away. What are the causes? This condition is usually caused by a blood clot that forms in a vein and moves to the lungs. In rare cases, it may be caused by air, fat, part of a tumor, or other tissue that moves through the veins and into the lungs. What increases the risk? The following factors may make you more likely to develop this condition:  Experiencing a traumatic injury, such as breaking a hip or leg.  Having: ? A spinal cord injury. ? Orthopedic surgery, especially hip or knee replacement. ? Any major surgery. ? A stroke. ? DVT. ? Blood clots or blood clotting disease. ? Long-term (chronic) lung or heart disease. ? Cancer treated with chemotherapy. ? A central venous catheter.  Taking medicines that contain estrogen. These include birth control pills and hormone replacement therapy.  Being: ? Pregnant. ? In the period of time after your baby is delivered (postpartum). ? Older than age 16. ? Overweight. ? A smoker, especially if you have other risks. What are the signs or symptoms? Symptoms of this condition usually start suddenly and include:  Shortness of breath during activity or at rest.  Coughing, coughing up blood, or coughing up blood-tinged mucus.  Chest pain that is often worse with deep breaths.  Rapid or irregular heartbeat.  Feeling light-headed or dizzy.  Fainting.  Feeling anxious.  Fever.  Sweating.  Pain and swelling in a leg. This is a symptom of DVT, which can lead to PE. How is this diagnosed? This condition may be diagnosed based on:  Your medical  history.  A physical exam.  Blood tests.  CT pulmonary angiogram. This test checks blood flow in and around your lungs.  Ventilation-perfusion scan, also called a lung VQ scan. This test measures air flow and blood flow to the lungs.  An ultrasound of the legs. How is this treated? Treatment for this condition depends on many factors, such as the cause of your PE, your risk for bleeding or developing more clots, and other medical conditions you have. Treatment aims to remove, dissolve, or stop blood clots from forming or growing larger. Treatment may include:  Medicines, such as: ? Blood thinning medicines (anticoagulants) to stop clots from forming. ? Medicines that dissolve clots (thrombolytics).  Procedures, such as: ? Using a flexible tube to remove a blood clot (embolectomy) or to deliver medicine to destroy it (catheter-directed thrombolysis). ? Inserting a filter into a large vein that carries blood to the heart (inferior vena cava). This filter (vena cava filter) catches blood clots before they reach the lungs. ? Surgery to remove the clot (surgical embolectomy). This is rare. You may need a combination of immediate, long-term (up to 3 months after diagnosis), and extended (more than 3 months after diagnosis) treatments. Your treatment may continue for several months (maintenance therapy). You and your health care provider will work together to choose the treatment program that is best for you. Follow these instructions at home: Medicines  Take over-the-counter and prescription medicines only as told by your health care provider.  If you  are taking an anticoagulant medicine: ? Take the medicine every day at the same time each day. ? Understand what foods and drugs interact with your medicine. ? Understand the side effects of this medicine, including excessive bruising or bleeding. Ask your health care provider or pharmacist about other side effects. General  instructions  Wear a medical alert bracelet or carry a medical alert card that says you have had a PE and lists what medicines you take.  Ask your health care provider when you may return to your normal activities. Avoid sitting or lying for a long time without moving.  Maintain a healthy weight. Ask your health care provider what weight is healthy for you.  Do not use any products that contain nicotine or tobacco, such as cigarettes, e-cigarettes, and chewing tobacco. If you need help quitting, ask your health care provider.  Talk with your health care provider about any travel plans. It is important to make sure that you are still able to take your medicine while on trips.  Keep all follow-up visits as told by your health care provider. This is important. Contact a health care provider if:  You missed a dose of your blood thinner medicine. Get help right away if:  You have: ? New or increased pain, swelling, warmth, or redness in an arm or leg. ? Numbness or tingling in an arm or leg. ? Shortness of breath during activity or at rest. ? A fever. ? Chest pain. ? A rapid or irregular heartbeat. ? A severe headache. ? Vision changes. ? A serious fall or accident, or you hit your head. ? Stomach (abdominal) pain. ? Blood in your vomit, stool, or urine. ? A cut that will not stop bleeding.  You cough up blood.  You feel light-headed or dizzy.  You cannot move your arms or legs.  You are confused or have memory loss. These symptoms may represent a serious problem that is an emergency. Do not wait to see if the symptoms will go away. Get medical help right away. Call your local emergency services (911 in the [place] Do not drive yourself to the hospital. Summary  A pulmonary embolism (PE) is a sudden blockage or decrease of blood flow in one or both lungs. PE is a dangerous and life-threatening condition that needs to be treated right away.  Treatments for this condition usually  include medicines to thin your blood (anticoagulants) or medicines to break apart blood clots (thrombolytics).  If you are given blood thinners, it is important to take the medicine every day at the same time each day.  Understand what foods and drugs interact with any medicines that you are taking.  If you have signs of PE or DVT, call your local emergency services (911 in the [place] This information is not intended to replace advice given to you by your health care provider. Make sure you discuss any questions you have with your health care provider. Document Revised: 07/01/2018 Document Reviewed: 07/01/2018 Elsevier Patient Education  2020 [employer].    Information on my medicine - ELIQUIS (apixaban)  This medication education was reviewed with me or my healthcare representative as part of my discharge preparation.   Why was Eliquis prescribed for you? Eliquis was prescribed to treat blood clots that may have been found in the veins of your legs (deep vein thrombosis) or in your lungs (pulmonary embolism) and to reduce the risk of them occurring again.  What do You need to know about Eliquis ?  The starting dose is 10 mg (two 5 mg tablets) taken TWICE daily for the FIRST SEVEN (7) DAYS, then on (enter date)  11/27/19  the dose is reduced to ONE 5 mg tablet taken TWICE daily.  Eliquis may be taken with or without food.   Try to take the dose about the same time in the morning and in the evening. If you have difficulty swallowing the tablet whole please discuss with your pharmacist how to take the medication safely.  Take Eliquis exactly as prescribed and DO NOT stop taking Eliquis without talking to the doctor who prescribed the medication.  Stopping may increase your risk of developing a new blood clot.  Refill your prescription before you run out.  After discharge, you should have regular check-up appointments with your healthcare provider that is prescribing your Eliquis.     What do you do if you miss a dose? If a dose of ELIQUIS is not taken at the scheduled time, take it as soon as possible on the same day and twice-daily administration should be resumed. The dose should not be doubled to make up for a missed dose.  Important Safety Information A possible side effect of Eliquis is bleeding. You should call your healthcare provider right away if you experience any of the following: ? Bleeding from an injury or your nose that does not stop. ? Unusual colored urine (red or dark brown) or unusual colored stools (red or black). ? Unusual bruising for unknown reasons. ? A serious fall or if you hit your head (even if there is no bleeding).  Some medicines may interact with Eliquis and might increase your risk of bleeding or clotting while on Eliquis. To help avoid this, consult your healthcare provider or pharmacist prior to using any new prescription or non-prescription medications, including herbals, vitamins, non-steroidal anti-inflammatory drugs (NSAIDs) and supplements.  This website has more information on Eliquis (apixaban): http://www.eliquis.com/eliquis/home

## 2019-11-21 NOTE — Progress Notes (Addendum)
PROGRESS NOTE  Karla Hoover CWC:[id number] DOB: 1970-11-16 DOA: 11/15/2019 PCP: Grayce Sessions, NP  HPI/Recap of past 76 hours: 49 year old female with extensive medical issues including COPD, chronic hypoxia on home oxygen 2 to 3 L at home, type 2 diabetes, history of CVA with left-sided hemiplegia, hypertension, ongoing cigarette smoker and poor compliance presented to the emergency room with chest discomfort, shortness of breath, frequent falls and unable to maintain her daily activities of living at home.  Apparently, patient moved from [place] where she was at a nursing home to live with her sisters.  In the emergency room, tachycardic, blood pressure stable, acute kidney injury with creatinine 1.47, WBC 14.6.  Lactic acid 2.7. Treated with bolus IV fluids with improvement.  CTA showed bilateral submassive pulmonary embolism with CT scan evidence of right heart strain, minimally elevated troponins, urine examination consistent with UTI.  She was admitted with antibiotics and heparin infusion.  11/21/19: Seen and examined.  Reports she has been able to tolerate a diet.  She denies dysphagia or odynophagia.  Reports she is not feeling well today and is dizzy upon standing.  Worsening alkaline phosphatase with unclear etiology.  AST and ALT improving spontaneously.  Total bilirubin normal.   Assessment/Plan: Principal Problem:   Bilateral pulmonary embolism (HCC) Active Problems:   Essential hypertension   COPD with chronic bronchitis (HCC)   DM (diabetes mellitus), type 2 (HCC)   History of stroke   AKI (acute kidney injury) (HCC)   Tobacco abuse   Elevated troponin  Bilateral pulmonary embolism with cor pulmonale, right heart strain , left femoral vein acute DVT:  Thromboembolism due to immobility, likely started from left leg. 2D echocardiogram showed right ventricular strain.   O2 saturations stable on room air. Currently on Eliquis  Abnormal LFTs Unclear  etiology ALT remains elevated AST has normalized Obtain GGT to confirm liver source Obtain acute hepatitis viral panel Obtain RUQ ultrasound T bilirubin normal  Denies any abdominal pain, admits to constipation of 7 days  Dizziness Obtain orthosthatic vital signs Start gentle IV fluid hydration LR 75cc/hr x 1 day Fall precautions  Chronic constipation Start bowel regimen with Senokot 2 tablets twice daily and MiraLAX 17 g twice daily Dulcolax suppository as needed  Resolving dysgeusia, unclear etiology Started days prior to presentation.              Now able to tolerate a diet           Ruled out sepsis, present on admission: Ruled out.  Procalcitonin normal.  Treated with broad-spectrum antibiotics and now remains on cefepime.  Blood cultures negative. her presentation could probably from thromboembolism. Discontinued all antibiotics and has remained stable.  Elevated troponins: Due to acute PE.  Stable and flat.  History of stroke with left-sided hemiplegia: Will need more therapies and placement for rehab.  She is on Plavix that we will continue.  On high-dose statin  COPD with chronic hypoxic respiratory failure: Stable.  No evidence of exacerbation.  On bronchodilator as needed.  Will obtain home O2 evaluation.  Type 2 diabetes on insulin with hyperglycemia: Continue current doses of insulin, A1c is 8.1.  Prolonged QT interval: Improved with electrolyte replacement.  Improving acute renal failure: improving, adequate urine out put.  Continue monitoring.  Resolved sinus tachycardia: Due to combination of factors.  Patient is on chronic pain medicine that was stopped, will resume, she is on metoprolol was resumed.  Ambulatory dysfunction post CVA PT recommended SNF TOC assisting  with SNF placement. Continue PT OT with assistance and fall precautions  DVT prophylaxis:  Eliquis Code Status: Full code Family Communication:  None at bedside. Disposition Plan:  patient is from SNF. Anticipated DC to SNF tomorrow 2/15, Barriers to discharge dizziness and abnormal LFTs, SNF placement.   Consultants:  PCCM,  Interventional radiology  Procedures:   None       Objective: Vitals:   11/20/19 1954 11/20/19 2036 11/21/19 0020 11/21/19 0425  BP: 131/89  107/81 107/79  Pulse: 100 96 85 82  Resp: 16 17 19 15   Temp: (!) 97.5 F (36.4 C)  97.7 F (36.5 C) 98.2 F (36.8 C)  TempSrc: Oral  Oral Oral  SpO2: 100% 99% 95% 94%  Weight:      Height:        Intake/Output Summary (Last 24 hours) at 11/21/2019 0933 Last data filed at 11/20/2019 1559 Gross per 24 hour  Intake 240 ml  Output --  Net 240 ml   Filed Weights   11/15/19 1832  Weight: 85 kg    Exam:  . General: 49 y.o. year-old female well-developed well-nourished no acute distress.  Alert and interactive.   . Cardiovascular: Regular rate and rhythm no rubs or gallops. Marland Kitchen Respiratory: Clear to auscultation with no wheezes or rales.  Good inspiratory effort. . Abdomen: Soft nontender nondistended with bowel sounds present.  Musculoskeletal: Left hemiplegia.  1+ pitting edema left lower extremity. Marland Kitchen Psychiatry: Mood is appropriate for condition and setting.   Data Reviewed: CBC: Recent Labs  Lab 11/16/19 1449 11/16/19 1449 11/17/19 0343 11/18/19 0104 11/19/19 0239 11/20/19 0334 11/21/19 0241  WBC 17.9*   < > 15.2* 12.8* 10.6* 11.0* 9.1  NEUTROABS 11.5*  --   --   --   --   --   --   HGB 14.4   < > 14.1 12.5 12.4 12.1 11.7*  HCT 43.0   < > 42.0 38.1 37.5 36.5 36.5  MCV 84.0   < > 84.2 85.4 84.7 85.5 86.3  PLT 257   < > 229 235 224 244 231   < > = values in this interval not displayed.   Basic Metabolic Panel: Recent Labs  Lab 11/15/19 1455 11/16/19 0033 11/16/19 0306 11/16/19 0306 11/16/19 1449 11/17/19 0343 11/18/19 0104 11/19/19 0239 11/20/19 0334  NA   < >  --  134*   < > 134* 133* 134* 136 134*  K   < >  --  5.5*   < > 4.9 4.1 4.2 4.6 4.5  CL   < >   --  100   < > 105 103 104 106 105  CO2   < >  --  23   < > 19* 19* 19* 22 21*  GLUCOSE   < >  --  257*   < > 181* 94 103* 137* 106*  BUN   < >  --  21*   < > 21* 19 20 19 18   CREATININE   < >  --  1.52*   < > 1.34* 1.24* 1.11* 1.19* 1.13*  CALCIUM   < >  --  8.9   < > 8.9 8.7* 8.8* 8.7* 8.7*  MG  --  1.7 1.8  --   --  2.2 1.9 2.2  --   PHOS  --   --  3.9  --   --  2.4* 2.0* 2.8  --    < > = values in this interval  not displayed.   GFR: Estimated Creatinine Clearance: 68.3 mL/min (A) (by C-G formula based on SCr of 1.13 mg/dL (H)). Liver Function Tests: Recent Labs  Lab 11/16/19 0306 11/17/19 0343 11/18/19 0104 11/19/19 0239 11/21/19 0754  AST 67* 182* 181* 86* 39  ALT 45* 177* 194* 163* 86*  ALKPHOS 122 101 140* 176* 315*  BILITOT 0.8 0.4 0.4 0.5 0.4  PROT 7.1 5.8* 6.1* 6.0* 6.4*  ALBUMIN 3.6 2.7* 2.8* 2.7* 2.5*   No results for input(s): LIPASE, AMYLASE in the last 168 hours. No results for input(s): AMMONIA in the last 168 hours. Coagulation Profile: Recent Labs  Lab 11/15/19 1800  INR 1.1   Cardiac Enzymes: No results for input(s): CKTOTAL, CKMB, CKMBINDEX, TROPONINI in the last 168 hours. BNP (last 3 results) No results for input(s): PROBNP in the last 8760 hours. HbA1C: No results for input(s): HGBA1C in the last 72 hours. CBG: Recent Labs  Lab 11/20/19 0627 11/20/19 1121 11/20/19 1603 11/20/19 2111 11/21/19 0616  GLUCAP 186* 203* 180* 195* 133*   Lipid Profile: Recent Labs    11/21/19 0754  CHOL 138  HDL 23*  LDLCALC 79  TRIG 371*  CHOLHDL 6.0   Thyroid Function Tests: No results for input(s): TSH, T4TOTAL, FREET4, T3FREE, THYROIDAB in the last 72 hours. Anemia Panel: No results for input(s): VITAMINB12, FOLATE, FERRITIN, TIBC, IRON, RETICCTPCT in the last 72 hours. Urine analysis:    Component Value Date/Time   COLORURINE YELLOW 11/15/2019 2139   APPEARANCEUR HAZY (A) 11/15/2019 2139   APPEARANCEUR Clear 11/24/2012 1238   LABSPEC 1.025  11/15/2019 2139   LABSPEC 1.000 11/24/2012 1238   PHURINE 6.0 11/15/2019 2139   GLUCOSEU 50 (A) 11/15/2019 2139   GLUCOSEU >=500 11/24/2012 1238   HGBUR NEGATIVE 11/15/2019 2139   BILIRUBINUR NEGATIVE 11/15/2019 2139   BILIRUBINUR Negative 11/24/2012 1238   KETONESUR NEGATIVE 11/15/2019 2139   PROTEINUR 100 (A) 11/15/2019 2139   UROBILINOGEN 1.0 04/30/2010 0902   NITRITE NEGATIVE 11/15/2019 2139   LEUKOCYTESUR SMALL (A) 11/15/2019 2139   LEUKOCYTESUR Negative 11/24/2012 1238   Sepsis Labs: @LABRCNTIP (procalcitonin:4,lacticidven:4)  ) Recent Results (from the past 240 hour(s))  Blood culture (routine x 2)     Status: None   Collection Time: 11/15/19  4:00 PM   Specimen: BLOOD RIGHT FOREARM  Result Value Ref Range Status   Specimen Description BLOOD RIGHT FOREARM  Final   Special Requests   Final    BOTTLES DRAWN AEROBIC AND ANAEROBIC Blood Culture results may not be optimal due to an inadequate volume of blood received in culture bottles   Culture   Final    NO GROWTH 5 DAYS Performed at [facility], [address] [place], [place]    Report Status 11/20/2019 FINAL  Final  Respiratory Panel by RT PCR (Flu A&B, Covid) - Nasopharyngeal Swab     Status: None   Collection Time: 11/15/19  4:19 PM   Specimen: Nasopharyngeal Swab  Result Value Ref Range Status   SARS Coronavirus 2 by RT PCR NEGATIVE NEGATIVE Final    Comment: (NOTE) SARS-CoV-2 target nucleic acids are NOT DETECTED. The SARS-CoV-2 RNA is generally detectable in upper respiratoy specimens during the acute phase of infection. The lowest concentration of SARS-CoV-2 viral copies this assay can detect is 131 copies/mL. A negative result does not preclude SARS-Cov-2 infection and should not be used as the sole basis for treatment or other patient management decisions. A negative result may occur with  improper  specimen collection/handling, submission of specimen other than nasopharyngeal swab,  presence of viral mutation(s) within the areas targeted by this assay, and inadequate number of viral copies (<131 copies/mL). A negative result must be combined with clinical observations, patient history, and epidemiological information. The expected result is Negative. Fact Sheet for Patients:  https://www.moore.com/ Fact Sheet for Healthcare Providers:  https://www.young.biz/ This test is not yet ap proved or cleared by the [place] FDA and  has been authorized for detection and/or diagnosis of SARS-CoV-2 by FDA under an Emergency Use Authorization (EUA). This EUA will remain  in effect (meaning this test can be used) for the duration of the COVID-19 declaration under Section 564(b)(1) of the Act, 21 U.S.C. section 360bbb-3(b)(1), unless the authorization is terminated or revoked sooner.    Influenza A by PCR NEGATIVE NEGATIVE Final   Influenza B by PCR NEGATIVE NEGATIVE Final    Comment: (NOTE) The Xpert Xpress SARS-CoV-2/FLU/RSV assay is intended as an aid in  the diagnosis of influenza from Nasopharyngeal swab specimens and  should not be used as a sole basis for treatment. Nasal washings and  aspirates are unacceptable for Xpert Xpress SARS-CoV-2/FLU/RSV  testing. Fact Sheet for Patients: https://www.moore.com/ Fact Sheet for Healthcare Providers: https://www.young.biz/ This test is not yet approved or cleared by the [place] FDA and  has been authorized for detection and/or diagnosis of SARS-CoV-2 by  FDA under an Emergency Use Authorization (EUA). This EUA will remain  in effect (meaning this test can be used) for the duration of the  Covid-19 declaration under Section 564(b)(1) of the Act, 21  U.S.C. section 360bbb-3(b)(1), unless the authorization is  terminated or revoked. Performed at [facility], [address], [place], [place] [address]   Blood culture (routine x 2)      Status: None   Collection Time: 11/15/19  9:04 PM   Specimen: BLOOD  Result Value Ref Range Status   Specimen Description BLOOD RIGHT ANTECUBITAL  Final   Special Requests   Final    BOTTLES DRAWN AEROBIC AND ANAEROBIC Blood Culture adequate volume   Culture   Final    NO GROWTH 5 DAYS Performed at [facility], [address], [place], [place] [address]    Report Status 11/20/2019 FINAL  Final  Urine culture     Status: None   Collection Time: 11/15/19  9:30 PM   Specimen: In/Out Cath Urine  Result Value Ref Range Status   Specimen Description IN/OUT CATH URINE  Final   Special Requests NONE  Final   Culture   Final    NO GROWTH Performed at [facility], [address], [place], [place] [address]    Report Status 11/17/2019 FINAL  Final      Studies: No results found.  Scheduled Meds: . apixaban  10 mg Oral BID   Followed by  . [START ON 11/27/2019] apixaban  5 mg Oral BID  . arformoterol  15 mcg Nebulization BID  . atorvastatin  80 mg Oral Daily  . budesonide (PULMICORT) nebulizer solution  0.5 mg Nebulization Q12H  . buPROPion  100 mg Oral BID  . Chlorhexidine Gluconate Cloth  6 each Topical Daily  . clopidogrel  75 mg Oral Daily  . diclofenac Sodium  2 g Topical TID AC & HS  . gabapentin  300 mg Oral TID  . insulin aspart  0-15 Units Subcutaneous TID AC & HS  . insulin glargine  15 Units Subcutaneous Daily  .  levothyroxine  50 mcg Oral Daily  . magnesium oxide  800 mg Oral BID  . metoprolol tartrate  25 mg Oral BID  . mirtazapine  7.5 mg Oral QHS  . nicotine  21 mg Transdermal Daily  . potassium & sodium phosphates  2 packet Oral TID WC & HS  . sodium chloride flush  3 mL Intravenous Once    Continuous Infusions:    LOS: 5 days     Darlin Drop, MD [facility] Pager [phone]  If 7PM-7AM, please contact night-coverage [email or web address] Password [facility] 11/21/2019, 9:33 AM

## 2019-11-22 ENCOUNTER — Inpatient Hospital Stay (HOSPITAL_COMMUNITY): Payer: Medicaid Other

## 2019-11-22 DIAGNOSIS — E663 Overweight: Secondary | ICD-10-CM | POA: Diagnosis present

## 2019-11-22 DIAGNOSIS — K5909 Other constipation: Secondary | ICD-10-CM | POA: Diagnosis present

## 2019-11-22 LAB — CBC
HCT: 35.5 % — ABNORMAL LOW (ref 36.0–46.0)
Hemoglobin: 11.1 g/dL — ABNORMAL LOW (ref 12.0–15.0)
MCH: 27.8 pg (ref 26.0–34.0)
MCHC: 31.3 g/dL (ref 30.0–36.0)
MCV: 88.8 fL (ref 80.0–100.0)
Platelets: 218 10*3/uL (ref 150–400)
RBC: 4 MIL/uL (ref 3.87–5.11)
RDW: 12.9 % (ref 11.5–15.5)
WBC: 9.3 10*3/uL (ref 4.0–10.5)
nRBC: 0 % (ref 0.0–0.2)

## 2019-11-22 LAB — GLUCOSE, CAPILLARY
Glucose-Capillary: 113 mg/dL — ABNORMAL HIGH (ref 70–99)
Glucose-Capillary: 279 mg/dL — ABNORMAL HIGH (ref 70–99)
Glucose-Capillary: 82 mg/dL (ref 70–99)
Glucose-Capillary: 132 mg/dL — ABNORMAL HIGH (ref 70–99)

## 2019-11-22 LAB — SARS CORONAVIRUS 2 (TAT 6-24 HRS): SARS Coronavirus 2: NEGATIVE

## 2019-11-22 LAB — BASIC METABOLIC PANEL
Anion gap: 10 (ref 5–15)
BUN: 15 mg/dL (ref 6–20)
CO2: 24 mmol/L (ref 22–32)
Calcium: 8.8 mg/dL — ABNORMAL LOW (ref 8.9–10.3)
Chloride: 102 mmol/L (ref 98–111)
Creatinine, Ser: 0.96 mg/dL (ref 0.44–1.00)
GFR calc Af Amer: >60 mL/min (ref >60)
GFR calc non Af Amer: >60 mL/min (ref >60)
Glucose, Bld: 156 mg/dL — ABNORMAL HIGH (ref 70–99)
Potassium: 5.2 mmol/L — ABNORMAL HIGH (ref 3.5–5.1)
Sodium: 136 mmol/L (ref 135–145)

## 2019-11-22 MED ORDER — ALPRAZOLAM 0.5 MG PO TABS: 0.5000 mg | ORAL_TABLET | Freq: Every day | ORAL | 0 refills | Status: DC | PRN

## 2019-11-22 MED ORDER — METHYLPHENIDATE HCL 5 MG PO TABS: 5.0000 mg | ORAL_TABLET | Freq: Every day | ORAL | 0 refills | Status: DC

## 2019-11-22 MED ORDER — OXYCODONE HCL 5 MG PO TABS: 5.0000 mg | ORAL_TABLET | ORAL | 0 refills | Status: DC | PRN

## 2019-11-22 NOTE — Progress Notes (Signed)
Occupational Therapy Treatment Patient Details Name: Karla Hoover MRN: [id number] DOB: [birth year] Today's Date: 11/22/2019    History of present illness Pt is a 49 y.o. female admitted 11/15/19 with chest discomfort, SOB, frequent falls and difficulty performing ADLs at home. CTA shows bilateral submassive pulmonary embolism with evidence of R heart strain. Also with acute L femoral DVT and UTI. PMH includes CVA (residual L-side hemiplegia), HTN, COPD, DM2, chronic hypoxia (on home 2-3L O2), DM2, tobacco use.   OT comments  Pt progressing with OOB ADL since evaluation. Pt limited by decreased ability to care for self, recent falls, and decreased activity tolerance. Pt sitting at EOB for donning shoes set-upA only; minA for standing to pull shorts up/down for toileting task. Pt not steady enough to let go of RW for more than a few seconds. LUE holding onto RW; RUE unable to grasp RW. Pt requiring minA for overall stability. Pt modA overall for ADL tasks. Pt would benefit from continued OT skilled services for ADL and energy conservation. OT following acutely.    Follow Up Recommendations  SNF;Supervision/Assistance - 24 hour    Equipment Recommendations  Other (comment)(TBD)    Recommendations for Other Services      Precautions / Restrictions Precautions Precautions: Fall;Other (comment) Precaution Comments: L hemiplegia Restrictions Weight Bearing Restrictions: No       Mobility Bed Mobility Overal bed mobility: Needs Assistance Bed Mobility: Supine to Sit     Supine to sit: Mod assist     General bed mobility comments: Pt using R side of body to roll and use of bed rail to EOB; pt modA to facilitate L hips to EOB  Transfers Overall transfer level: Needs assistance Equipment used: Rolling walker (2 wheeled)(next time use EVA walker)                  Balance Overall balance assessment: Needs assistance Sitting-balance support: Single extremity supported;Feet  supported Sitting balance-Leahy Scale: Good     Standing balance support: Single extremity supported;During functional activity Standing balance-Leahy Scale: Poor Standing balance comment: Pt able to let go of RW for 10 secs at a time for pericare                           ADL either performed or assessed with clinical judgement   ADL Overall ADL's : Needs assistance/impaired Eating/Feeding: Set up;Sitting Eating/Feeding Details (indicate cue type and reason): 1 handed attempts to open packages; requiring set-upA for task                 Lower Body Dressing: Supervision/safety;Sitting/lateral leans;Minimal assistance;Sit to/from stand Lower Body Dressing Details (indicate cue type and reason): Pt sitting at EOB for donning shoes set-upA only; minA for standing to pull shorts up/down for toileting task. Pt not steady enough to let go of RW for more than a few seconds. Toilet Transfer: Minimal Science writer Details (indicate cue type and reason): LUE holding onto RW; RUE unable to grasp RW. Pt requiring minA for overall stability. Toileting- Clothing Manipulation and Hygiene: Moderate assistance;Sitting/lateral lean;Sit to/from stand Toileting - Clothing Manipulation Details (indicate cue type and reason): Pt stood to perform pericare with minguardA and then required modA after only a few seconds of letting go of RW- pt requiring stability in standing     Functional mobility during ADLs: Minimal assistance;Moderate assistance;Cueing for safety;Cueing for sequencing;Rolling walker(+2 for more than SPT) General ADL Comments: Pt limited by decreased ability  to care for self, recent falls, and decreased activity tolerance.     Vision   Additional Comments: Pt with poor vision at baseline from previous CVA   Perception     Praxis      Cognition Arousal/Alertness: Awake/alert Behavior During Therapy: WFL for tasks  assessed/performed Overall Cognitive Status: Within Functional Limits for tasks assessed                                 General Comments: had to arouse from sleeping, but pt wanting to participate        Exercises     Shoulder Instructions       General Comments Pt tolerating session well.    Pertinent Vitals/ Pain       Pain Assessment: Faces Faces Pain Scale: Hurts little more Pain Location: L shoulder Pain Descriptors / Indicators: Discomfort Pain Intervention(s): Monitored during session  Home Living                                          Prior Functioning/Environment              Frequency  Min 2X/week        Progress Toward Goals  OT Goals(current goals can now be found in the care plan section)  Progress towards OT goals: Progressing toward goals  Acute Rehab OT Goals Patient Stated Goal: go to rehab at SNF OT Goal Formulation: With patient Time For Goal Achievement: 12/02/19 Potential to Achieve Goals: Good ADL Goals Pt Will Perform Grooming: with set-up;with supervision;sitting Pt Will Perform Upper Body Bathing: with mod assist;sitting Pt Will Perform Lower Body Bathing: with mod assist;sitting/lateral leans Pt Will Perform Upper Body Dressing: with mod assist;sitting Pt Will Transfer to Toilet: with max assist;with mod assist;with +2 assist;stand pivot transfer;bedside commode Additional ADL Goal #1: Pt will tolerate L UE PROM to decrease risk of skin breakdown and joint contracture/stiffness  Plan Discharge plan remains appropriate    Co-evaluation                 AM-PAC OT "6 Clicks" Daily Activity     Outcome Measure   Help from another person eating meals?: A Little Help from another person taking care of personal grooming?: A Little Help from another person toileting, which includes using toliet, bedpan, or urinal?: A Lot Help from another person bathing (including washing, rinsing, drying)?:  A Lot Help from another person to put on and taking off regular upper body clothing?: A Lot Help from another person to put on and taking off regular lower body clothing?: A Lot 6 Click Score: 14    End of Session Equipment Utilized During Treatment: Rolling walker;Gait belt  OT Visit Diagnosis: Other abnormalities of gait and mobility (R26.89);Muscle weakness (generalized) (M62.81);History of falling (Z91.81);Hemiplegia and hemiparesis Hemiplegia - Right/Left: Left Hemiplegia - dominant/non-dominant: Non-Dominant Hemiplegia - caused by: Cerebral infarction   Activity Tolerance Patient tolerated treatment well;Patient limited by pain   Patient Left in bed;with call bell/phone within reach   Nurse Communication Mobility status        Time: 0840-0900 OT Time Calculation (min): 20 min  Charges: OT General Charges $OT Visit: 1 Visit OT Treatments $Self Care/Home Management : 8-22 mins  Jefferey Pica, OTR/L Acute Rehabilitation Services Pager: [phone] Office: [phone]    Ebony Hail  J Josefina Rynders 11/22/2019, 9:34 AM

## 2019-11-22 NOTE — Discharge Summary (Signed)
Discharge Summary  Karla Hoover KDT:[id number] DOB: April 14, 1971  PCP: Kerin Perna, NP  Admit date: 11/15/2019 Anticipated discharge date: 11/22/2019  Time spent: 25 minutes   Recommendations for Outpatient Follow-up:  1. Patient be discharged to [facility] skilled nursing facility 2. Medication change: The following medications have been stopped-Procardia, Voltaren tablet, Lotrel, Symbicort, Norco 3. Medication change: Xanax 0.5 mg p.o. daily as needed  Discharge Diagnoses:  Active Hospital Problems   Diagnosis Date Noted  . Bilateral pulmonary embolism ([place]) 11/15/2019  . Tobacco abuse 11/16/2019  . Elevated troponin 11/16/2019  . DM (diabetes mellitus), type 2 ([place]) 11/15/2019  . History of stroke 11/15/2019  . AKI (acute kidney injury) ([place]) 11/15/2019  . Essential hypertension 05/04/2009  . COPD with chronic bronchitis ([place]) 05/04/2009    Resolved Hospital Problems  No resolved problems to display.    Discharge Condition: Improved, being discharged to skilled nursing  Diet recommendation: Heart healthy, carb modified  Vitals:   11/22/19 0620 11/22/19 0923  BP: 119/83 118/67  Pulse: 71 78  Resp: 14   Temp: 98.1 F (36.7 C)   SpO2: 92%     History of present illness:  49 year old female with extensive medical issues including COPD, chronic hypoxia on home oxygen 2 to 3 L at home, type 2 diabetes, history of CVA with left-sided hemiplegia, hypertension, ongoing cigarette smoker and poor compliance presented to the emergency room on 2/8 with chest discomfort, shortness of breath, frequent falls and unable to maintain her daily activities of living at home. Apparently, patient moved from [place] where she was at a nursing home to live with her sisters.  In the emergency room, she was found to have a UTI, lactic acid level of 2.7, white count of 14.6 and acute kidney injury with creatinine of 1.47 and a CT angiogram noted bilateral submassive PE with CT  evidence of right heart strain.  Patient started on IV fluids and admitted to the hospitalist service.  Hospital Course:  Principal Problem:   Bilateral pulmonary embolism (HCC) from left femoral vein DVT: Patient's oxygenation stable on room air.  She was started on Eliquis.  Left lower extremity Dopplers revealed DVT this was likely felt to be from immobility.  Patient has been tolerating anticoagulation well.  Chest pain-free now at this point. Active Problems:   Abnormal LFTs/chronic cholecystitis Initially presented with mild elevation in AST which is since normalized.  ALT still stays slightly elevated, last at 86.  Elevated alkaline phos of 315 with elevated GGT of 173.  Right upper quadrant ultrasound notes edematous gallbladder, but patient has no right upper quadrant pain and is able to tolerate p.o.  At this point, would not recommend any intervention, but should she start to develop pain or nausea/vomiting following eating, would look into gallbladder removal at that point.  Tobacco abuse: Counseled to quit  Dizziness Obtain orthosthatic vital signs Given gentle IV fluid hydration LR 75cc/hr x 1 day Fall precautions  Chronic constipation Start bowel regimen with Senokot 2 tablets twice daily and MiraLAX 17 g twice daily.  Dulcolax suppository as needed  Resolving dysgeusia, unclear etiology Started days prior to presentation.              Now able to tolerate a diet           Lactic acidosis.  Urinary tract infection.  Ruled out sepsis, present on admission:Lactic acidosis felt to be secondary to intravascular volume depletion.. Procalcitonin normal. Elevated white blood cell count secondary  to UTI which was initially with broad-spectrum antibiotics.Blood cultures negative. her presentation could probably from thromboembolism. Discontinuedall antibiotics and has remained stable.  Elevated troponins: Due to acute PE. Stable and flat.  History of stroke with  left-sided hemiplegia: She is on Plavix that we will continue.  On high-dose statin.  Seen by PT and OT who recommended short-term skilled nursing.  Patient was accepted at [facility] and bed available on 2/15.  She is pending a follow-up negative Covid test and after this test, will be able to be transferred  COPD with chronic hypoxic respiratory failure:Stable. No evidence of exacerbation. On bronchodilator as needed.  Will obtain home O2 evaluation.  Uncontrolled type 2 diabetes on insulin with hyperglycemia:A1c at 8.1.  Patient put on Lantus and sliding scale which she has been tolerating well.  Discharged on these medications.  Prolonged QT interval: Resolved with electrolyte replacement.  Acute kidney injury:Secondary to UTI.  Treated with IV fluids.  Resolved.  Resolved sinus tachycardia: Due to combination of factors including pain, stopping of her chronic pain medications as well as her metoprolol.  Medications able to be resumed.  Heart rate now under control.  Ambulatory dysfunction secondary to history of CVA CVA .  Procedures:  Echocardiogram done 2/9: Preserved ejection fraction, indeterminant diastolic pressures.  No signs of valvular dysfunction.  Right ventricular systolic function severely reduced.  Lower extremity Dopplers done 2/9: Positive for DVT in the left femoral vein  Consultations:  Critical care  Interventional radiology.  Discharge Exam: BP 118/67   Pulse 78   Temp 98.1 F (36.7 C) (Oral)   Resp 14   Ht 5' 6.5" (1.689 m)   Wt 85 kg   SpO2 92%   BMI 29.79 kg/m   General: Alert and oriented x2, no acute distress Cardiovascular: Regular rate and rhythm, S1-S2 Respiratory: Clear to auscultation bilaterally  Discharge Instructions You were cared for by a hospitalist during your hospital stay. If you have any questions about your discharge medications or the care you received while you were in the hospital after you are discharged, you  can call the unit and asked to speak with the hospitalist on call if the hospitalist that took care of you is not available. Once you are discharged, your primary care physician will handle any further medical issues. Please note that NO REFILLS for any discharge medications will be authorized once you are discharged, as it is imperative that you return to your primary care physician (or establish a relationship with a primary care physician if you do not have one) for your aftercare needs so that they can reassess your need for medications and monitor your lab values.   Allergies as of 11/22/2019      Reactions   Guaifenesin Anaphylaxis   Levaquin [levofloxacin In D5w] Shortness Of Breath, Rash   Strawberry Extract Anaphylaxis   Kiwi Extract Other (See Comments)   Other reaction(s): Other (See Comments)      Medication List    STOP taking these medications   acetaminophen 325 MG tablet Commonly known as: TYLENOL   amLODipine-benazepril 10-40 MG capsule Commonly known as: LOTREL   budesonide-formoterol 160-4.5 MCG/ACT inhaler Commonly known as: Symbicort   diclofenac 50 MG EC tablet Commonly known as: VOLTAREN   HYDROcodone-acetaminophen 7.5-325 MG tablet Commonly known as: NORCO   NIFEdipine 30 MG 24 hr tablet Commonly known as: PROCARDIA-XL/NIFEDICAL-XL     TAKE these medications   albuterol 108 (90 Base) MCG/ACT inhaler Commonly known as: VENTOLIN  HFA Inhale 2 puffs into the lungs every 6 (six) hours as needed for wheezing or shortness of breath.   ALPRAZolam 0.5 MG tablet Commonly known as: XANAX Take 1 tablet (0.5 mg total) by mouth daily as needed for anxiety. What changed: when to take this   apixaban 5 MG Tabs tablet Commonly known as: Eliquis Take 2 tablets ('10mg'$ ) twice daily for 7 days, then 1 tablet ('5mg'$ ) twice daily   atorvastatin 80 MG tablet Commonly known as: LIPITOR Take 1 tablet (80 mg total) by mouth daily.   Basaglar KwikPen 100 UNIT/ML  Sopn Inject 0.15 mLs (15 Units total) into the skin daily.   buPROPion 100 MG 12 hr tablet Commonly known as: WELLBUTRIN SR Take 1 tablet (100 mg total) by mouth 2 (two) times daily.   clopidogrel 75 MG tablet Commonly known as: PLAVIX Take 1 tablet (75 mg total) by mouth daily.   cyclobenzaprine 10 MG tablet Commonly known as: FLEXERIL Take 1 tablet (10 mg total) by mouth 3 (three) times daily as needed for muscle spasms.   docusate sodium 100 MG capsule Commonly known as: COLACE Take 100 mg by mouth 2 (two) times daily as needed for constipation.   gabapentin 300 MG capsule Commonly known as: NEURONTIN Take 1 capsule (300 mg total) by mouth 3 (three) times daily.   ibuprofen 800 MG tablet Commonly known as: ADVIL Take 1 tablet (800 mg total) by mouth every 8 (eight) hours as needed. What changed: reasons to take this   insulin lispro 100 UNIT/ML KwikPen Commonly known as: HumaLOG KwikPen Inject 0.01-0.05 mLs (1-5 Units total) into the skin 3 (three) times daily as needed. PRN BLOOD SUGAR 151-200 1 unit 201-250 2 units 251-300 3 units 301-350 4 units 351-399 5 units Over 400 call MD   ipratropium-albuterol 0.5-2.5 (3) MG/3ML Soln Commonly known as: DUONEB Inhale 3 mLs into the lungs 4 (four) times daily as needed. What changed: reasons to take this   levothyroxine 50 MCG tablet Commonly known as: SYNTHROID Take 1 tablet (50 mcg total) by mouth daily.   lidocaine 5 % Commonly known as: LIDODERM Place 1 patch onto the skin every 12 (twelve) hours as needed.   methylphenidate 5 MG tablet Commonly known as: RITALIN Take 1 tablet (5 mg total) by mouth daily for 10 days.   metoprolol tartrate 25 MG tablet Commonly known as: LOPRESSOR Take 1 tablet (25 mg total) by mouth 2 (two) times daily.   mirtazapine 7.5 MG tablet Commonly known as: REMERON Take 1 tablet (7.5 mg total) by mouth at bedtime. What changed:   medication strength  how much to take   Narcan 4  MG/0.1ML Liqd nasal spray kit Generic drug: naloxone Place 1 spray into the nose once as needed. overdose   nicotine 21 mg/24hr patch Commonly known as: NICODERM CQ - dosed in mg/24 hours Place 21 mg onto the skin as needed. Smoking sensation   omeprazole 20 MG capsule Commonly known as: PRILOSEC Take 20 mg by mouth daily.   oxyCODONE 5 MG immediate release tablet Commonly known as: Oxy IR/ROXICODONE Take 1 tablet (5 mg total) by mouth every 4 (four) hours as needed for severe pain or breakthrough pain.   Pen Needles 32G X 4 MM Misc 1 each by Does not apply route as needed.   polyethylene glycol 17 g packet Commonly known as: MIRALAX / GLYCOLAX Take 17 g by mouth 2 (two) times daily as needed.   tetrahydrozoline 0.05 % ophthalmic solution Place  1 drop into both eyes 4 (four) times daily as needed. Eye irritation   Trelegy Ellipta 100-62.5-25 MCG/INH Aepb Generic drug: Fluticasone-Umeclidin-Vilant Inhale 1 puff into the lungs daily.       Allergies  Allergen Reactions  . Guaifenesin Anaphylaxis  . Levaquin [Levofloxacin In D5w] Shortness Of Breath and Rash  . Strawberry Extract Anaphylaxis  . Kiwi Extract Other (See Comments)    Other reaction(s): Other (See Comments)    Follow-up Information    Kerin Perna, NP. Call in 1 day(s).   Specialty: Internal Medicine Why: PLease call for a post hospital follow up appointment. Contact information: [address] [place] [address] [phone]            The results of significant diagnostics from this hospitalization (including imaging, microbiology, ancillary and laboratory) are listed below for reference.    Significant Diagnostic Studies: DG Chest 2 View  Result Date: 11/15/2019 CLINICAL DATA:  Chest pain and shortness of breath EXAM: CHEST - 2 VIEW COMPARISON:  November 03, 2019 FINDINGS: There is mild left base atelectasis. The lungs elsewhere are clear. Heart size and pulmonary vascular normal.  No adenopathy. No pneumothorax. There is mild degenerative change in the thoracic spine. IMPRESSION: Mild left base atelectasis. Lungs elsewhere clear. Cardiac silhouette within normal limits. Electronically Signed   By: Lowella Grip III M.D.   On: 11/15/2019 15:15   DG Ribs Unilateral W/Chest Left  Result Date: 11/03/2019 CLINICAL DATA:  Left-sided chest pain after fall. EXAM: LEFT RIBS AND CHEST - 3+ VIEW COMPARISON:  Chest x-ray dated Feb 27, 2015. FINDINGS: No fracture or other bone lesions are seen involving the ribs. There is no evidence of pneumothorax or pleural effusion. Both lungs are clear. Heart size and mediastinal contours are within normal limits. IMPRESSION: Negative. Electronically Signed   By: Titus Dubin M.D.   On: 11/03/2019 18:08   DG Thoracic Spine 2 View  Result Date: 11/03/2019 CLINICAL DATA:  Back pain after ground level fall. EXAM: THORACIC SPINE 2 VIEWS COMPARISON:  Chest x-ray dated Feb 28, 2015. FINDINGS: Twelve rib-bearing thoracic vertebral bodies. No acute fracture or subluxation. Vertebral body heights are preserved. Alignment is normal. Mild degenerative disc disease in the mid to lower thoracic spine. IMPRESSION: No acute osseous abnormality. Electronically Signed   By: Titus Dubin M.D.   On: 11/03/2019 18:16   DG Forearm Left  Result Date: 11/03/2019 CLINICAL DATA:  Left forearm pain after ground level fall. EXAM: LEFT FOREARM - 2 VIEW COMPARISON:  None. FINDINGS: There is no evidence of fracture or other focal bone lesions. Soft tissues are unremarkable. IMPRESSION: Negative. Electronically Signed   By: Titus Dubin M.D.   On: 11/03/2019 18:12   DG Knee 2 Views Left  Result Date: 11/03/2019 CLINICAL DATA:  Left knee pain after ground level fall. EXAM: LEFT KNEE - 1-2 VIEW COMPARISON:  None. FINDINGS: No evidence of fracture, dislocation, or joint effusion. No evidence of arthropathy or other focal bone abnormality. Soft tissues are unremarkable.  IMPRESSION: Negative. Electronically Signed   By: Titus Dubin M.D.   On: 11/03/2019 18:14   DG Ankle Complete Left  Result Date: 11/03/2019 CLINICAL DATA:  Left ankle pain after ground level fall. EXAM: LEFT ANKLE COMPLETE - 3+ VIEW COMPARISON:  None. FINDINGS: No acute fracture or dislocation. The ankle mortise is symmetric. The talar dome is intact. No tibiotalar joint effusion. Joint spaces are preserved. Bone mineralization is normal. Small Achilles enthesophyte. Mild soft tissue swelling medially.  IMPRESSION: No acute osseous abnormality. Electronically Signed   By: Titus Dubin M.D.   On: 11/03/2019 18:14   CT Head Wo Contrast  Result Date: 11/03/2019 CLINICAL DATA:  Fall. Neck pain. History of prior stroke with left hemiparesis. EXAM: CT HEAD WITHOUT CONTRAST CT CERVICAL SPINE WITHOUT CONTRAST TECHNIQUE: Multidetector CT imaging of the head and cervical spine was performed following the standard protocol without intravenous contrast. Multiplanar CT image reconstructions of the cervical spine were also generated. COMPARISON:  CT neck dated June 21, 2012. CT head dated April 27, 2011. FINDINGS: CT HEAD FINDINGS Brain: No evidence of acute infarction, hemorrhage, hydrocephalus, extra-axial collection or mass lesion/mass effect. Chronic infarct in the right basal ganglia and external capsule. Vascular: Atherosclerotic vascular calcification of the carotid siphons. No hyperdense vessel. Skull: Normal. Negative for fracture or focal lesion. Sinuses/Orbits: No acute finding. Other: None. CT CERVICAL SPINE FINDINGS Alignment: No traumatic malalignment. Skull base and vertebrae: No acute fracture. No primary bone lesion or focal pathologic process. Soft tissues and spinal canal: No prevertebral fluid or swelling. No visible canal hematoma. Disc levels: Disc heights are relatively preserved. Early uncovertebral hypertrophy at C3-C4 and C4-C5. Upper chest: Mild centrilobular emphysema. Other: Mildly  enlarged heterogeneous thyroid gland without discrete nodule. IMPRESSION: 1. No acute intracranial abnormality. Chronic infarct in the right basal ganglia and external capsule. 2. No acute cervical spine fracture. Electronically Signed   By: Titus Dubin M.D.   On: 11/03/2019 18:06   CT Angio Chest PE W and/or Wo Contrast  Result Date: 11/15/2019 CLINICAL DATA:  Shortness of breath, chest pain EXAM: CT ANGIOGRAPHY CHEST WITH CONTRAST TECHNIQUE: Multidetector CT imaging of the chest was performed using the standard protocol during bolus administration of intravenous contrast. Multiplanar CT image reconstructions and MIPs were obtained to evaluate the vascular anatomy. CONTRAST:  160m OMNIPAQUE IOHEXOL 350 MG/ML SOLN COMPARISON:  06/21/2012 FINDINGS: Cardiovascular: Bilateral moderate to large volume pulmonary emboli noted involving arterial branches in all lobes of both lungs. Evidence of right heart strain. RV/LV ratio 2.0. Heart is normal size. Aorta normal caliber. Mediastinum/Nodes: No mediastinal, hilar, or axillary adenopathy. Trachea and esophagus are unremarkable. Thyroid unremarkable. Lungs/Pleura: Left base atelectasis.  No effusions. Upper Abdomen: Imaging into the upper abdomen shows no acute findings. Musculoskeletal: Chest wall soft tissues are unremarkable. No acute bony abnormality. Review of the MIP images confirms the above findings. IMPRESSION: Positive for acute PE with CT evidence of right heart strain (RV/LV Ratio = 2.0) consistent with at least submassive (intermediate risk) PE. The presence of right heart strain has been associated with an increased risk of morbidity and mortality. Please activate Code PE by paging [phone] Critical Value/emergent results were called by telephone at the time of interpretation on 11/15/2019 at 9:31 pm [facility] verbally acknowledged these results. Electronically Signed   By: KRolm BaptiseM.D.   On: 11/15/2019 21:32   CT Cervical  Spine Wo Contrast  Result Date: 11/03/2019 CLINICAL DATA:  Fall. Neck pain. History of prior stroke with left hemiparesis. EXAM: CT HEAD WITHOUT CONTRAST CT CERVICAL SPINE WITHOUT CONTRAST TECHNIQUE: Multidetector CT imaging of the head and cervical spine was performed following the standard protocol without intravenous contrast. Multiplanar CT image reconstructions of the cervical spine were also generated. COMPARISON:  CT neck dated June 21, 2012. CT head dated April 27, 2011. FINDINGS: CT HEAD FINDINGS Brain: No evidence of acute infarction, hemorrhage, hydrocephalus, extra-axial collection or mass lesion/mass effect. Chronic infarct in the right basal  ganglia and external capsule. Vascular: Atherosclerotic vascular calcification of the carotid siphons. No hyperdense vessel. Skull: Normal. Negative for fracture or focal lesion. Sinuses/Orbits: No acute finding. Other: None. CT CERVICAL SPINE FINDINGS Alignment: No traumatic malalignment. Skull base and vertebrae: No acute fracture. No primary bone lesion or focal pathologic process. Soft tissues and spinal canal: No prevertebral fluid or swelling. No visible canal hematoma. Disc levels: Disc heights are relatively preserved. Early uncovertebral hypertrophy at C3-C4 and C4-C5. Upper chest: Mild centrilobular emphysema. Other: Mildly enlarged heterogeneous thyroid gland without discrete nodule. IMPRESSION: 1. No acute intracranial abnormality. Chronic infarct in the right basal ganglia and external capsule. 2. No acute cervical spine fracture. Electronically Signed   By: Titus Dubin M.D.   On: 11/03/2019 18:06   DG Humerus Left  Result Date: 11/03/2019 CLINICAL DATA:  Left upper arm pain after ground level fall. EXAM: LEFT HUMERUS - 2+ VIEW COMPARISON:  None. FINDINGS: There is no evidence of fracture or other focal bone lesions. Soft tissues are unremarkable. IMPRESSION: Negative. Electronically Signed   By: Titus Dubin M.D.   On: 11/03/2019  18:12   ECHOCARDIOGRAM COMPLETE  Result Date: 11/16/2019    ECHOCARDIOGRAM REPORT   Patient Name:   Karla Hoover Date of Exam: 11/16/2019 Medical Rec #:  [id number]         Height:       66.5 in Accession #:    [id number]        Weight:       187.4 lb Date of Birth:  1971/01/27         BSA:          1.96 m Patient Age:    59 years          BP:           124/101 mmHg Patient Gender: F                 HR:           133 bpm. Exam Location:  Inpatient Procedure: 2D Echo, Color Doppler and Cardiac Doppler Indications:    I26.02 Pulmonary embolus  History:        Patient has no prior history of Echocardiogram examinations.                 COPD; Risk Factors:Hypertension and Diabetes.  Sonographer:    Raquel Sarna Senior RDCS Referring Phys: [place]  1. Left ventricular ejection fraction, by estimation, is 65 to 70%. The left ventricle has normal function. The left ventrical has no regional wall motion abnormalities. There is mildly increased left ventricular hypertrophy. Left ventricular diastolic parameters are indeterminate. Elevated left ventricular end-diastolic pressure.  2. Right ventricular systolic function is severely reduced. The right ventricular size is moderately enlarged. There is mildly elevated pulmonary artery systolic pressure. The estimated right ventricular systolic pressure is 58.0 mmHg.  3. Right atrial size was mild to moderately dilated.  4. No evidence of mitral valve regurgitation.  5. The aortic valve is normal in structure and function. Aortic valve regurgitation is not visualized. No aortic stenosis is present.  6. The inferior vena cava is normal in size with <50% respiratory variability, suggesting right atrial pressure of 8 mmHg. FINDINGS  Left Ventricle: Left ventricular ejection fraction, by estimation, is 65 to 70%. The left ventricle has normal function. The left ventricle has no regional wall motion abnormalities. The left ventricular internal cavity size was  normal in size. There is  mildly increased left ventricular hypertrophy. Concentric left ventricular hypertrophy. Left ventricular diastolic parameters are indeterminate. Right Ventricle: The right ventricular size is moderately enlarged. No increase in right ventricular wall thickness. Right ventricular systolic function is severely reduced. There is mildly elevated pulmonary artery systolic pressure. The tricuspid regurgitant velocity is 2.70 m/s, and with an assumed right atrial pressure of 8 mmHg, the estimated right ventricular systolic pressure is 46.5 mmHg. Left Atrium: Left atrial size was normal in size. Right Atrium: Right atrial size was mild to moderately dilated. Pericardium: There is no evidence of pericardial effusion. Mitral Valve: The mitral valve is normal in structure and function. Normal mobility of the mitral valve leaflets. No evidence of mitral valve regurgitation. No evidence of mitral valve stenosis. Tricuspid Valve: The tricuspid valve is normal in structure. Tricuspid valve regurgitation is mild . No evidence of tricuspid stenosis. Aortic Valve: The aortic valve is normal in structure and function. Aortic valve regurgitation is not visualized. No aortic stenosis is present. Pulmonic Valve: The pulmonic valve was normal in structure. Pulmonic valve regurgitation is not visualized. No evidence of pulmonic stenosis. Aorta: The aortic root is normal in size and structure. Venous: The inferior vena cava is normal in size with less than 50% respiratory variability, suggesting right atrial pressure of 8 mmHg. The inferior vena cava and the hepatic vein show a normal flow pattern. IAS/Shunts: No atrial level shunt detected by color flow Doppler.  LEFT VENTRICLE PLAX 2D LVIDd:         3.17 cm  Diastology LVIDs:         2.24 cm  LV e' lateral:   2.61 cm/s LV PW:         1.28 cm  LV E/e' lateral: 27.7 LV IVS:        1.25 cm  LV e' medial:    3.59 cm/s LVOT diam:     1.90 cm  LV E/e' medial:  20.1 LV  SV:         20.41 ml LV SV Index:   11.42 LVOT Area:     2.84 cm  RIGHT VENTRICLE RV S prime:     4.90 cm/s TAPSE (M-mode): 0.9 cm LEFT ATRIUM             Index       RIGHT ATRIUM           Index LA diam:        2.50 cm 1.28 cm/m  RA Area:     17.00 cm LA Vol (A2C):   36.3 ml 18.56 ml/m RA Volume:   53.00 ml  27.09 ml/m LA Vol (A4C):   17.6 ml 9.00 ml/m LA Biplane Vol: 25.4 ml 12.98 ml/m  AORTIC VALVE LVOT Vmax:   56.90 cm/s LVOT Vmean:  39.500 cm/s LVOT VTI:    0.072 m  AORTA Ao Root diam: 2.70 cm Ao Asc diam:  2.90 cm MITRAL VALVE                        TRICUSPID VALVE MV Area (PHT): 8.43 cm             TR Peak grad:   29.2 mmHg MV Decel Time: 90 msec              TR Vmax:        270.00 cm/s MV E velocity: 72.30 cm/s 103 cm/s MV A velocity: 46.60 cm/s 70.3 cm/s SHUNTS MV E/A ratio:  1.55       1.5       Systemic VTI:  0.07 m                                     Systemic Diam: 1.90 cm Fransico Him MD Electronically signed by Fransico Him MD Signature Date/Time: 11/16/2019/12:17:23 PM    Final    DG Hip Unilat W or Wo Pelvis 2-3 Views Left  Result Date: 11/03/2019 CLINICAL DATA:  Left hip pain after ground level fall. EXAM: DG HIP (WITH OR WITHOUT PELVIS) 2-3V LEFT COMPARISON:  CT abdomen pelvis dated August 25, 2019. FINDINGS: There is no evidence of hip fracture or dislocation. There is no evidence of arthropathy or other focal bone abnormality. IMPRESSION: Negative. Electronically Signed   By: Titus Dubin M.D.   On: 11/03/2019 18:13   VAS [place] LOWER EXTREMITY VENOUS (DVT)  Result Date: 11/16/2019  Lower Venous DVTStudy Indications: Pulmonary embolism.  Comparison Study: no prior Performing Technologist: Abram Sander RVS  Examination Guidelines: A complete evaluation includes B-mode imaging, spectral Doppler, color Doppler, and power Doppler as needed of all accessible portions of each vessel. Bilateral testing is considered an integral part of a complete examination. Limited examinations for  reoccurring indications may be performed as noted. The reflux portion of the exam is performed with the patient in reverse Trendelenburg.  +---------+---------------+---------+-----------+----------+--------------+ RIGHT    CompressibilityPhasicitySpontaneityPropertiesThrombus Aging +---------+---------------+---------+-----------+----------+--------------+ CFV      Full           Yes      Yes                                 +---------+---------------+---------+-----------+----------+--------------+ SFJ      Full                                                        +---------+---------------+---------+-----------+----------+--------------+ FV Prox  Full                                                        +---------+---------------+---------+-----------+----------+--------------+ FV Mid   Full                                                        +---------+---------------+---------+-----------+----------+--------------+ FV DistalFull                                                        +---------+---------------+---------+-----------+----------+--------------+ PFV      Full                                                        +---------+---------------+---------+-----------+----------+--------------+  POP      Full           Yes      Yes                                 +---------+---------------+---------+-----------+----------+--------------+ PTV      Full                                                        +---------+---------------+---------+-----------+----------+--------------+ PERO     Full                                                        +---------+---------------+---------+-----------+----------+--------------+   +---------+---------------+---------+-----------+----------+--------------+ LEFT     CompressibilityPhasicitySpontaneityPropertiesThrombus Aging  +---------+---------------+---------+-----------+----------+--------------+ CFV      Full           Yes      Yes                                 +---------+---------------+---------+-----------+----------+--------------+ SFJ      Full                                                        +---------+---------------+---------+-----------+----------+--------------+ FV Prox  None                                         Acute          +---------+---------------+---------+-----------+----------+--------------+ FV Mid   None                                         Acute          +---------+---------------+---------+-----------+----------+--------------+ FV DistalNone                                         Acute          +---------+---------------+---------+-----------+----------+--------------+ PFV      Full                                                        +---------+---------------+---------+-----------+----------+--------------+ POP      Full           Yes      Yes                                 +---------+---------------+---------+-----------+----------+--------------+ PTV  Full                                                        +---------+---------------+---------+-----------+----------+--------------+ PERO     Full                                                        +---------+---------------+---------+-----------+----------+--------------+     Summary: RIGHT: - There is no evidence of deep vein thrombosis in the lower extremity.  - No cystic structure found in the popliteal fossa.  LEFT: - Findings consistent with acute deep vein thrombosis involving the left femoral vein. - No cystic structure found in the popliteal fossa.  *See table(s) above for measurements and observations. Electronically signed by Deitra Mayo MD on 11/16/2019 at 12:14:25 PM.    Final    US Abdomen Limited RUQ  Result Date: 11/22/2019 CLINICAL DATA:   Abnormal LFTs. EXAM: ULTRASOUND ABDOMEN LIMITED RIGHT UPPER QUADRANT COMPARISON:  CT 08/25/2019 FINDINGS: Gallbladder: Partially distended. No gallstones. There is circumferential edematous gallbladder wall thickening up to 9 mm. No pericholecystic fluid. No sonographic Murphy sign noted by sonographer. Common bile duct: Diameter: 5 mm. Liver: No focal lesion identified. Within normal limits in parenchymal echogenicity. Portal vein is patent on color Doppler imaging with normal direction of blood flow towards the liver. Other: Right pleural effusion noted. IMPRESSION: 1. Partially distended gallbladder with edematous wall thickening up to 9 mm. No gallstones. Gallbladder wall thickening is nonspecific, favor systemic etiology. If there is clinical concern for acalculous cholecystitis, recommend further evaluation with nuclear medicine HIDA scan. 2. No biliary dilatation. 3. No focal hepatic lesion or findings to explain elevated LFTs. Electronically Signed   By: Keith Rake M.D.   On: 11/22/2019 05:18    Microbiology: Recent Results (from the past 240 hour(s))  Blood culture (routine x 2)     Status: None   Collection Time: 11/15/19  4:00 PM   Specimen: BLOOD RIGHT FOREARM  Result Value Ref Range Status   Specimen Description BLOOD RIGHT FOREARM  Final   Special Requests   Final    BOTTLES DRAWN AEROBIC AND ANAEROBIC Blood Culture results may not be optimal due to an inadequate volume of blood received in culture bottles   Culture   Final    NO GROWTH 5 DAYS Performed at [facility], [place] [address] [place], [place] [address]    Report Status 11/20/2019 FINAL  Final  Respiratory Panel by RT PCR (Flu A&B, Covid) - Nasopharyngeal Swab     Status: None   Collection Time: 11/15/19  4:19 PM   Specimen: Nasopharyngeal Swab  Result Value Ref Range Status   SARS Coronavirus 2 by RT PCR NEGATIVE NEGATIVE Final    Comment: (NOTE) SARS-CoV-2 target nucleic acids are NOT DETECTED. The  SARS-CoV-2 RNA is generally detectable in upper respiratoy specimens during the acute phase of infection. The lowest concentration of SARS-CoV-2 viral copies this assay can detect is 131 copies/mL. A negative result does not preclude SARS-Cov-2 infection and should not be used as the sole basis for treatment or other patient management decisions. A negative result may occur with  improper  specimen collection/handling, submission of specimen other than nasopharyngeal swab, presence of viral mutation(s) within the areas targeted by this assay, and inadequate number of viral copies (<131 copies/mL). A negative result must be combined with clinical observations, patient history, and epidemiological information. The expected result is Negative. Fact Sheet for Patients:  PinkCheek.be Fact Sheet for Healthcare Providers:  GravelBags.it This test is not yet ap proved or cleared by the [place] FDA and  has been authorized for detection and/or diagnosis of SARS-CoV-2 by FDA under an Emergency Use Authorization (EUA). This EUA will remain  in effect (meaning this test can be used) for the duration of the COVID-19 declaration under Section 564(b)(1) of the Act, 21 U.S.C. section 360bbb-3(b)(1), unless the authorization is terminated or revoked sooner.    Influenza A by PCR NEGATIVE NEGATIVE Final   Influenza B by PCR NEGATIVE NEGATIVE Final    Comment: (NOTE) The Xpert Xpress SARS-CoV-2/FLU/RSV assay is intended as an aid in  the diagnosis of influenza from Nasopharyngeal swab specimens and  should not be used as a sole basis for treatment. Nasal washings and  aspirates are unacceptable for Xpert Xpress SARS-CoV-2/FLU/RSV  testing. Fact Sheet for Patients: PinkCheek.be Fact Sheet for Healthcare Providers: GravelBags.it This test is not yet approved or cleared by the [place] FDA and  has been authorized for detection and/or diagnosis of SARS-CoV-2 by  FDA under an Emergency Use Authorization (EUA). This EUA will remain  in effect (meaning this test can be used) for the duration of the  Covid-19 declaration under Section 564(b)(1) of the Act, 21  U.S.C. section 360bbb-3(b)(1), unless the authorization is  terminated or revoked. Performed at [facility], [place] [address] [place], [place] [address]   Blood culture (routine x 2)     Status: None   Collection Time: 11/15/19  9:04 PM   Specimen: BLOOD  Result Value Ref Range Status   Specimen Description BLOOD RIGHT ANTECUBITAL  Final   Special Requests   Final    BOTTLES DRAWN AEROBIC AND ANAEROBIC Blood Culture adequate volume   Culture   Final    NO GROWTH 5 DAYS Performed at [facility], [place] [address] [place], [place] [address]    Report Status 11/20/2019 FINAL  Final  Urine culture     Status: None   Collection Time: 11/15/19  9:30 PM   Specimen: In/Out Cath Urine  Result Value Ref Range Status   Specimen Description IN/OUT CATH URINE  Final   Special Requests NONE  Final   Culture   Final    NO GROWTH Performed at [facility], [place] [address], [place], [place] [address]    Report Status 11/17/2019 FINAL  Final     Labs: Basic Metabolic Panel: Recent Labs  Lab 11/15/19 1455 11/16/19 0033 11/16/19 0306 11/16/19 1449 11/17/19 0343 11/18/19 0104 11/19/19 0239 11/20/19 0334 11/22/19 0250  NA   < >  --  134*   < > 133* 134* 136 134* 136  K   < >  --  5.5*   < > 4.1 4.2 4.6 4.5 5.2*  CL   < >  --  100   < > 103 104 106 105 102  CO2   < >  --  23   < > 19* 19* 22 21* 24  GLUCOSE   < >  --  257*   < > 94 103* 137* 106* 156*  BUN   < >  --  21*   < > '19 20 19 18 15  '$ CREATININE   < >  --  1.52*   < > 1.24* 1.11* 1.19* 1.13* 0.96  CALCIUM   < >  --  8.9   < > 8.7* 8.8* 8.7* 8.7* 8.8*  MG  --  1.7 1.8  --  2.2 1.9 2.2  --   --   PHOS  --   --  3.9  --  2.4* 2.0* 2.8   --   --    < > = values in this interval not displayed.   Liver Function Tests: Recent Labs  Lab 11/16/19 0306 11/17/19 0343 11/18/19 0104 11/19/19 0239 11/21/19 0754  AST 67* 182* 181* 86* 39  ALT 45* 177* 194* 163* 86*  ALKPHOS 122 101 140* 176* 315*  BILITOT 0.8 0.4 0.4 0.5 0.4  PROT 7.1 5.8* 6.1* 6.0* 6.4*  ALBUMIN 3.6 2.7* 2.8* 2.7* 2.5*   No results for input(s): LIPASE, AMYLASE in the last 168 hours. No results for input(s): AMMONIA in the last 168 hours. CBC: Recent Labs  Lab 11/16/19 1449 11/17/19 0343 11/18/19 0104 11/19/19 0239 11/20/19 0334 11/21/19 0241 11/22/19 0250  WBC 17.9*   < > 12.8* 10.6* 11.0* 9.1 9.3  NEUTROABS 11.5*  --   --   --   --   --   --   HGB 14.4   < > 12.5 12.4 12.1 11.7* 11.1*  HCT 43.0   < > 38.1 37.5 36.5 36.5 35.5*  MCV 84.0   < > 85.4 84.7 85.5 86.3 88.8  PLT 257   < > 235 224 244 231 218   < > = values in this interval not displayed.   Cardiac Enzymes: No results for input(s): CKTOTAL, CKMB, CKMBINDEX, TROPONINI in the last 168 hours. BNP: BNP (last 3 results) Recent Labs    11/15/19 2218 11/16/19 0306 11/16/19 1449  BNP 218.7* 296.1* 665.2*    ProBNP (last 3 results) No results for input(s): PROBNP in the last 8760 hours.  CBG: Recent Labs  Lab 11/21/19 0616 11/21/19 1128 11/21/19 1633 11/21/19 2138 11/22/19 0617  GLUCAP 133* 288* 136* 148* 113*       Signed:  Annita Brod, MD [facility] 11/22/2019, 11:28 AM

## 2019-11-22 NOTE — Progress Notes (Signed)
Physical Therapy Treatment Patient Details Name: Karla Hoover MRN: [id number] DOB: 1971/08/22 Today's Date: 11/22/2019    History of Present Illness Pt is a 49 y.o. female admitted 11/15/19 with chest discomfort, SOB, frequent falls and difficulty performing ADLs at home. CTA shows bilateral submassive pulmonary embolism with evidence of R heart strain. Also with acute L femoral DVT and UTI. PMH includes CVA (residual L-side hemiplegia), HTN, COPD, DM2, chronic hypoxia (on home 2-3L O2), DM2, tobacco use.    PT Comments    Patient seen for mobility progression. Continue to progress as tolerated with anticipated d/c to SNF for further skilled PT services.     Follow Up Recommendations  SNF;Supervision/Assistance - 24 hour     Equipment Recommendations  Other (comment)(defer)    Recommendations for Other Services       Precautions / Restrictions Precautions Precautions: Fall;Other (comment) Precaution Comments: L hemiplegia Restrictions Weight Bearing Restrictions: No    Mobility  Bed Mobility Overal bed mobility: Needs Assistance Bed Mobility: Supine to Sit     Supine to sit: Min guard     General bed mobility comments: use of rail and HOB elevated; min guard for safety  Transfers Overall transfer level: Needs assistance Equipment used: Rolling walker (2 wheeled)(EVA walker) Transfers: Sit to/from [employer] Sit to Stand: Mod assist;Min assist Stand pivot transfers: Mod assist       General transfer comment: pt pivoted EOB to [facility] with use of RW, assist for balance, and assist to manage RW; pt then stood from [facility] with min A to power up into standing and mod A to gain balance upon standing and then assist to place L UE onto EVA walker  Ambulation/Gait Ambulation/Gait assistance: Mod assist;+2 safety/equipment Gait Distance (Feet): 25 Feet Assistive device: (EVA walker) Gait Pattern/deviations: Step-to pattern;Decreased dorsiflexion -  left;Decreased weight shift to left;Shuffle;Ataxic;Decreased step length - left;Wide base of support     General Gait Details: cues for L foot placement prior to attempting step due to foot getting caught on EVA walker; assist for weigth shifting/balance and managing AD as pt pushes EVA walker to right side   Stairs             Wheelchair Mobility    Modified Rankin (Stroke Patients Only) Modified Rankin (Stroke Patients Only) Pre-Morbid Rankin Score: Moderately severe disability Modified Rankin: Moderately severe disability     Balance Overall balance assessment: Needs assistance Sitting-balance support: Single extremity supported;Feet supported Sitting balance-Leahy Scale: Good     Standing balance support: Single extremity supported;During functional activity Standing balance-Leahy Scale: Poor                              Cognition Arousal/Alertness: Awake/alert Behavior During Therapy: Impulsive Overall Cognitive Status: No family/caregiver present to determine baseline cognitive functioning                                 General Comments: pt with decreased safety awareness       Exercises      General Comments        Pertinent Vitals/Pain Pain Assessment: Faces Faces Pain Scale: Hurts little more Pain Location: L shoulder Pain Descriptors / Indicators: Discomfort Pain Intervention(s): Limited activity within patient's tolerance;Repositioned    Home Living  Prior Function            PT Goals (current goals can now be found in the care plan section) Acute Rehab PT Goals Patient Stated Goal: go to rehab at SNF Progress towards PT goals: Progressing toward goals    Frequency    Min 3X/week      PT Plan Current plan remains appropriate    Co-evaluation              AM-PAC PT "6 Clicks" Mobility   Outcome Measure  Help needed turning from your back to your side while in a  flat bed without using bedrails?: A Little Help needed moving from lying on your back to sitting on the side of a flat bed without using bedrails?: A Little Help needed moving to and from a bed to a chair (including a wheelchair)?: A Little Help needed standing up from a chair using your arms (e.g., wheelchair or bedside chair)?: A Little Help needed to walk in hospital room?: A Lot Help needed climbing 3-5 steps with a railing? : Total 6 Click Score: 15    End of Session Equipment Utilized During Treatment: Gait belt Activity Tolerance: Patient tolerated treatment well Patient left: in chair;with call bell/phone within reach;with chair alarm set Nurse Communication: Mobility status PT Visit Diagnosis: Muscle weakness (generalized) (M62.81);Difficulty in walking, not elsewhere classified (R26.2);History of falling (Z91.81);Hemiplegia and hemiparesis Hemiplegia - Right/Left: Left Hemiplegia - dominant/non-dominant: Non-dominant Hemiplegia - caused by: Cerebral infarction     Time: 1345-1415 PT Time Calculation (min) (ACUTE ONLY): 30 min  Charges:  $Gait Training: 23-37 mins                     Earney Navy, PTA Acute Rehabilitation Services Pager: [phone] Office: [phone]     Darliss Cheney 11/22/2019, 4:21 PM

## 2019-11-22 NOTE — TOC Progression Note (Signed)
Transition of Care [facility]) - Progression Note    Patient Details  Name: Karla Hoover MRN: [id number] Date of Birth: September 06, 1971  [facility] CM/SW Contact  Okey Dupre Lazaro Arms, LCSW Phone Number: 11/22/2019, 5:45 PM  Clinical Narrative: Reviewed chart and patient has chosen [facility] for ongoing nursing care, as patient is Medicaid only. Contacted admissions director Velna Hatchet and messages left and also sent text, and no response.  [facility] is only facility that made a bed offer.     Expected Discharge Plan: Skilled Nursing Facility Barriers to Discharge: Continued Medical Work up, SNF Pending bed offer  Expected Discharge Plan and Services Expected Discharge Plan: Skilled Nursing Facility In-house Referral: Clinical Social Work     Living arrangements for the past 2 months: Apartment                                     Social Determinants of Health (SDOH) Interventions  No SDOH intervention requested or needed at this time.  Readmission Risk Interventions No flowsheet data found.

## 2019-11-23 LAB — CBC
HCT: 35.9 % — ABNORMAL LOW (ref 36.0–46.0)
Hemoglobin: 11.5 g/dL — ABNORMAL LOW (ref 12.0–15.0)
MCH: 27.8 pg (ref 26.0–34.0)
MCHC: 32 g/dL (ref 30.0–36.0)
MCV: 86.9 fL (ref 80.0–100.0)
Platelets: 242 10*3/uL (ref 150–400)
RBC: 4.13 MIL/uL (ref 3.87–5.11)
RDW: 12.9 % (ref 11.5–15.5)
WBC: 9.6 10*3/uL (ref 4.0–10.5)
nRBC: 0 % (ref 0.0–0.2)

## 2019-11-23 LAB — GLUCOSE, CAPILLARY
Glucose-Capillary: 119 mg/dL — ABNORMAL HIGH (ref 70–99)
Glucose-Capillary: 241 mg/dL — ABNORMAL HIGH (ref 70–99)
Glucose-Capillary: 203 mg/dL — ABNORMAL HIGH (ref 70–99)

## 2019-11-23 NOTE — TOC Transition Note (Signed)
Transition of Care [facility]) - CM/SW Discharge Note   Patient Details  Name: LENNA HAGARTY MRN: [id number] Date of Birth: 01-04-1971  Transition of Care [facility]) CM/SW Contact:  Eduard Roux, LCSWA Phone Number: 11/23/2019, 2:45 PM   Clinical Narrative:     Patient will DC to: [facility]  DC Date: 11/23/2019 Family Notified: Rinaldo Cloud, mother  Transport By: Sharin Mons  RN, patient, and facility notified of DC. Discharge Summary sent to facility. RN given number for [phone]. Ambulance transport requested for patient.   Clinical Social Worker signing off. Antony Blackbird, MSW, LCSWA Clinical Social Worker      Barriers to Discharge: Continued Medical Work up, SNF Pending bed offer   Patient Goals and CMS Choice        Discharge Placement                       Discharge Plan and Services In-house Referral: Clinical Social Work                                   Social Determinants of Health (SDOH) Interventions     Readmission Risk Interventions No flowsheet data found.

## 2019-11-23 NOTE — Discharge Summary (Signed)
Discharge Summary  MERCER STALLWORTH PPJ:[id number] DOB: January 26, 1971  PCP: Kerin Perna, NP  Admit date: 11/15/2019 Anticipated discharge date: 11/23/2019  Time spent: 25 minutes   Recommendations for Outpatient Follow-up:  1. Patient be discharged to [facility] skilled nursing facility 2. Medication change: The following medications have been stopped-Procardia, Voltaren tablet, Lotrel, Symbicort, Norco 3. Medication change: Xanax 0.5 mg p.o. daily as needed  Discharge Diagnoses:  Active Hospital Problems   Diagnosis Date Noted  . Bilateral pulmonary embolism ([place]) 11/15/2019  . Overweight (BMI 25.0-29.9) 11/22/2019  . Chronic constipation 11/22/2019  . Tobacco abuse 11/16/2019  . Elevated troponin 11/16/2019  . DM (diabetes mellitus), type 2 ([place]) 11/15/2019  . History of stroke 11/15/2019  . AKI (acute kidney injury) ([place]) 11/15/2019  . Essential hypertension 05/04/2009  . COPD with chronic bronchitis ([place]) 05/04/2009    Resolved Hospital Problems  No resolved problems to display.    Discharge Condition: Improved, being discharged to skilled nursing  Diet recommendation: Heart healthy, carb modified  Vitals:   11/23/19 0947 11/23/19 0951  BP: (!) 159/93 (!) 159/93  Pulse: 85 84  Resp: 16   Temp:    SpO2: 96%     History of present illness:  49 year old female with extensive medical issues including COPD, chronic hypoxia on home oxygen 2 to 3 L at home, type 2 diabetes, history of CVA with left-sided hemiplegia, hypertension, ongoing cigarette smoker and poor compliance presented to the emergency room on 2/8 with chest discomfort, shortness of breath, frequent falls and unable to maintain her daily activities of living at home. Apparently, patient moved from [place] where she was at a nursing home to live with her sisters.  In the emergency room, she was found to have a UTI, lactic acid level of 2.7, white count of 14.6 and acute kidney injury with creatinine of  1.47 and a CT angiogram noted bilateral submassive PE with CT evidence of right heart strain.  Patient started on IV fluids and admitted to the hospitalist service.  Hospital Course:  Principal Problem:   Bilateral pulmonary embolism (HCC) from left femoral vein DVT: Patient's oxygenation stable on room air.  She was started on Eliquis.  Left lower extremity Dopplers revealed DVT this was likely felt to be from immobility.  Patient has been tolerating anticoagulation well.  Chest pain-free now at this point. Active Problems:   Abnormal LFTs/chronic cholecystitis Initially presented with mild elevation in AST which is since normalized.  ALT still stays slightly elevated, last at 86.  Elevated alkaline phos of 315 with elevated GGT of 173.  Right upper quadrant ultrasound notes edematous gallbladder, but patient has no right upper quadrant pain and is able to tolerate p.o.  At this point, would not recommend any intervention, but should she start to develop pain or nausea/vomiting following eating, would look into gallbladder removal at that point.  Tobacco abuse: Counseled to quit  Dizziness Obtain orthosthatic vital signs Given gentle IV fluid hydration LR 75cc/hr x 1 day Fall precautions  Chronic constipation Start bowel regimen with Senokot 2 tablets twice daily and MiraLAX 17 g twice daily.  Dulcolax suppository as needed  Resolving dysgeusia, unclear etiology Started days prior to presentation.              Now able to tolerate a diet           Lactic acidosis.  Urinary tract infection.  Ruled out sepsis, present on admission:Lactic acidosis felt to be secondary to intravascular  volume depletion.. Procalcitonin normal. Elevated white blood cell count secondary to UTI which was initially with broad-spectrum antibiotics.Blood cultures negative. her presentation could probably from thromboembolism. Discontinuedall antibiotics and has remained stable.  Elevated troponins: Due to  acute PE. Stable and flat.  History of stroke with left-sided hemiplegia: She is on Plavix that we will continue.  On high-dose statin.  Seen by PT and OT who recommended short-term skilled nursing.  Patient was accepted at [facility] and bed available on 2/16.  A repeat negative Covid test was done on 2/15.  COPD with chronic hypoxic respiratory failure:Stable. No evidence of exacerbation. On bronchodilator as needed.  Will obtain home O2 evaluation.  Uncontrolled type 2 diabetes on insulin with hyperglycemia:A1c at 8.1.  Patient put on Lantus and sliding scale which she has been tolerating well.  Discharged on these medications.  Prolonged QT interval: Resolved with electrolyte replacement.  Acute kidney injury:Secondary to UTI.  Treated with IV fluids.  Resolved.  Resolved sinus tachycardia: Due to combination of factors including pain, stopping of her chronic pain medications as well as her metoprolol.  Medications able to be resumed.  Heart rate now under control.  Ambulatory dysfunction secondary to history of CVA .  Procedures:  Echocardiogram done 2/9: Preserved ejection fraction, indeterminant diastolic pressures.  No signs of valvular dysfunction.  Right ventricular systolic function severely reduced.  Lower extremity Dopplers done 2/9: Positive for DVT in the left femoral vein  Consultations:  Critical care  Interventional radiology.  Discharge Exam: BP (!) 159/93   Pulse 84   Temp 98.6 F (37 C) (Oral)   Resp 16   Ht 5' 6.5" (1.689 m)   Wt 85 kg   SpO2 96%   BMI 29.79 kg/m   General: Alert and oriented x2, no acute distress Cardiovascular: Regular rate and rhythm, S1-S2 Respiratory: Clear to auscultation bilaterally  Discharge Instructions You were cared for by a hospitalist during your hospital stay. If you have any questions about your discharge medications or the care you received while you were in the hospital after you are discharged, you  can call the unit and asked to speak with the hospitalist on call if the hospitalist that took care of you is not available. Once you are discharged, your primary care physician will handle any further medical issues. Please note that NO REFILLS for any discharge medications will be authorized once you are discharged, as it is imperative that you return to your primary care physician (or establish a relationship with a primary care physician if you do not have one) for your aftercare needs so that they can reassess your need for medications and monitor your lab values.   Allergies as of 11/23/2019      Reactions   Guaifenesin Anaphylaxis   Levaquin [levofloxacin In D5w] Shortness Of Breath, Rash   Strawberry Extract Anaphylaxis   Kiwi Extract Other (See Comments)   Other reaction(s): Other (See Comments)      Medication List    STOP taking these medications   acetaminophen 325 MG tablet Commonly known as: TYLENOL   amLODipine-benazepril 10-40 MG capsule Commonly known as: LOTREL   budesonide-formoterol 160-4.5 MCG/ACT inhaler Commonly known as: Symbicort   diclofenac 50 MG EC tablet Commonly known as: VOLTAREN   HYDROcodone-acetaminophen 7.5-325 MG tablet Commonly known as: NORCO   NIFEdipine 30 MG 24 hr tablet Commonly known as: PROCARDIA-XL/NIFEDICAL-XL     TAKE these medications   albuterol 108 (90 Base) MCG/ACT inhaler Commonly known as:  VENTOLIN HFA Inhale 2 puffs into the lungs every 6 (six) hours as needed for wheezing or shortness of breath.   ALPRAZolam 0.5 MG tablet Commonly known as: XANAX Take 1 tablet (0.5 mg total) by mouth daily as needed for anxiety. What changed: when to take this   apixaban 5 MG Tabs tablet Commonly known as: Eliquis Take 2 tablets ('10mg'$ ) twice daily for 7 days, then 1 tablet ('5mg'$ ) twice daily   atorvastatin 80 MG tablet Commonly known as: LIPITOR Take 1 tablet (80 mg total) by mouth daily.   Basaglar KwikPen 100 UNIT/ML  Sopn Inject 0.15 mLs (15 Units total) into the skin daily.   buPROPion 100 MG 12 hr tablet Commonly known as: WELLBUTRIN SR Take 1 tablet (100 mg total) by mouth 2 (two) times daily.   clopidogrel 75 MG tablet Commonly known as: PLAVIX Take 1 tablet (75 mg total) by mouth daily.   cyclobenzaprine 10 MG tablet Commonly known as: FLEXERIL Take 1 tablet (10 mg total) by mouth 3 (three) times daily as needed for muscle spasms.   docusate sodium 100 MG capsule Commonly known as: COLACE Take 100 mg by mouth 2 (two) times daily as needed for constipation.   gabapentin 300 MG capsule Commonly known as: NEURONTIN Take 1 capsule (300 mg total) by mouth 3 (three) times daily.   ibuprofen 800 MG tablet Commonly known as: ADVIL Take 1 tablet (800 mg total) by mouth every 8 (eight) hours as needed. What changed: reasons to take this   insulin lispro 100 UNIT/ML KwikPen Commonly known as: HumaLOG KwikPen Inject 0.01-0.05 mLs (1-5 Units total) into the skin 3 (three) times daily as needed. PRN BLOOD SUGAR 151-200 1 unit 201-250 2 units 251-300 3 units 301-350 4 units 351-399 5 units Over 400 call MD   ipratropium-albuterol 0.5-2.5 (3) MG/3ML Soln Commonly known as: DUONEB Inhale 3 mLs into the lungs 4 (four) times daily as needed. What changed: reasons to take this   levothyroxine 50 MCG tablet Commonly known as: SYNTHROID Take 1 tablet (50 mcg total) by mouth daily.   lidocaine 5 % Commonly known as: LIDODERM Place 1 patch onto the skin every 12 (twelve) hours as needed.   methylphenidate 5 MG tablet Commonly known as: RITALIN Take 1 tablet (5 mg total) by mouth daily for 10 days.   metoprolol tartrate 25 MG tablet Commonly known as: LOPRESSOR Take 1 tablet (25 mg total) by mouth 2 (two) times daily.   mirtazapine 7.5 MG tablet Commonly known as: REMERON Take 1 tablet (7.5 mg total) by mouth at bedtime. What changed:   medication strength  how much to take   Narcan 4  MG/0.1ML Liqd nasal spray kit Generic drug: naloxone Place 1 spray into the nose once as needed. overdose   nicotine 21 mg/24hr patch Commonly known as: NICODERM CQ - dosed in mg/24 hours Place 21 mg onto the skin as needed. Smoking sensation   omeprazole 20 MG capsule Commonly known as: PRILOSEC Take 20 mg by mouth daily.   oxyCODONE 5 MG immediate release tablet Commonly known as: Oxy IR/ROXICODONE Take 1 tablet (5 mg total) by mouth every 4 (four) hours as needed for severe pain or breakthrough pain.   Pen Needles 32G X 4 MM Misc 1 each by Does not apply route as needed.   polyethylene glycol 17 g packet Commonly known as: MIRALAX / GLYCOLAX Take 17 g by mouth 2 (two) times daily as needed.   tetrahydrozoline 0.05 % ophthalmic solution  Place 1 drop into both eyes 4 (four) times daily as needed. Eye irritation   Trelegy Ellipta 100-62.5-25 MCG/INH Aepb Generic drug: Fluticasone-Umeclidin-Vilant Inhale 1 puff into the lungs daily.       Allergies  Allergen Reactions  . Guaifenesin Anaphylaxis  . Levaquin [Levofloxacin In D5w] Shortness Of Breath and Rash  . Strawberry Extract Anaphylaxis  . Kiwi Extract Other (See Comments)    Other reaction(s): Other (See Comments)    Follow-up Information    Kerin Perna, NP. Call in 1 day(s).   Specialty: Internal Medicine Why: PLease call for a post hospital follow up appointment. Contact information: [address] [place]  [address] [phone]            The results of significant diagnostics from this hospitalization (including imaging, microbiology, ancillary and laboratory) are listed below for reference.    Significant Diagnostic Studies: DG Chest 2 View  Result Date: 11/15/2019 CLINICAL DATA:  Chest pain and shortness of breath EXAM: CHEST - 2 VIEW COMPARISON:  November 03, 2019 FINDINGS: There is mild left base atelectasis. The lungs elsewhere are clear. Heart size and pulmonary vascular normal.  No adenopathy. No pneumothorax. There is mild degenerative change in the thoracic spine. IMPRESSION: Mild left base atelectasis. Lungs elsewhere clear. Cardiac silhouette within normal limits. Electronically Signed   By: Lowella Grip III M.D.   On: 11/15/2019 15:15   DG Ribs Unilateral W/Chest Left  Result Date: 11/03/2019 CLINICAL DATA:  Left-sided chest pain after fall. EXAM: LEFT RIBS AND CHEST - 3+ VIEW COMPARISON:  Chest x-ray dated Feb 27, 2015. FINDINGS: No fracture or other bone lesions are seen involving the ribs. There is no evidence of pneumothorax or pleural effusion. Both lungs are clear. Heart size and mediastinal contours are within normal limits. IMPRESSION: Negative. Electronically Signed   By: Titus Dubin M.D.   On: 11/03/2019 18:08   DG Thoracic Spine 2 View  Result Date: 11/03/2019 CLINICAL DATA:  Back pain after ground level fall. EXAM: THORACIC SPINE 2 VIEWS COMPARISON:  Chest x-ray dated Feb 28, 2015. FINDINGS: Twelve rib-bearing thoracic vertebral bodies. No acute fracture or subluxation. Vertebral body heights are preserved. Alignment is normal. Mild degenerative disc disease in the mid to lower thoracic spine. IMPRESSION: No acute osseous abnormality. Electronically Signed   By: Titus Dubin M.D.   On: 11/03/2019 18:16   DG Forearm Left  Result Date: 11/03/2019 CLINICAL DATA:  Left forearm pain after ground level fall. EXAM: LEFT FOREARM - 2 VIEW COMPARISON:  None. FINDINGS: There is no evidence of fracture or other focal bone lesions. Soft tissues are unremarkable. IMPRESSION: Negative. Electronically Signed   By: Titus Dubin M.D.   On: 11/03/2019 18:12   DG Knee 2 Views Left  Result Date: 11/03/2019 CLINICAL DATA:  Left knee pain after ground level fall. EXAM: LEFT KNEE - 1-2 VIEW COMPARISON:  None. FINDINGS: No evidence of fracture, dislocation, or joint effusion. No evidence of arthropathy or other focal bone abnormality. Soft tissues are unremarkable.  IMPRESSION: Negative. Electronically Signed   By: Titus Dubin M.D.   On: 11/03/2019 18:14   DG Ankle Complete Left  Result Date: 11/03/2019 CLINICAL DATA:  Left ankle pain after ground level fall. EXAM: LEFT ANKLE COMPLETE - 3+ VIEW COMPARISON:  None. FINDINGS: No acute fracture or dislocation. The ankle mortise is symmetric. The talar dome is intact. No tibiotalar joint effusion. Joint spaces are preserved. Bone mineralization is normal. Small Achilles enthesophyte. Mild soft tissue swelling  medially. IMPRESSION: No acute osseous abnormality. Electronically Signed   By: Titus Dubin M.D.   On: 11/03/2019 18:14   CT Head Wo Contrast  Result Date: 11/03/2019 CLINICAL DATA:  Fall. Neck pain. History of prior stroke with left hemiparesis. EXAM: CT HEAD WITHOUT CONTRAST CT CERVICAL SPINE WITHOUT CONTRAST TECHNIQUE: Multidetector CT imaging of the head and cervical spine was performed following the standard protocol without intravenous contrast. Multiplanar CT image reconstructions of the cervical spine were also generated. COMPARISON:  CT neck dated June 21, 2012. CT head dated April 27, 2011. FINDINGS: CT HEAD FINDINGS Brain: No evidence of acute infarction, hemorrhage, hydrocephalus, extra-axial collection or mass lesion/mass effect. Chronic infarct in the right basal ganglia and external capsule. Vascular: Atherosclerotic vascular calcification of the carotid siphons. No hyperdense vessel. Skull: Normal. Negative for fracture or focal lesion. Sinuses/Orbits: No acute finding. Other: None. CT CERVICAL SPINE FINDINGS Alignment: No traumatic malalignment. Skull base and vertebrae: No acute fracture. No primary bone lesion or focal pathologic process. Soft tissues and spinal canal: No prevertebral fluid or swelling. No visible canal hematoma. Disc levels: Disc heights are relatively preserved. Early uncovertebral hypertrophy at C3-C4 and C4-C5. Upper chest: Mild centrilobular emphysema. Other: Mildly  enlarged heterogeneous thyroid gland without discrete nodule. IMPRESSION: 1. No acute intracranial abnormality. Chronic infarct in the right basal ganglia and external capsule. 2. No acute cervical spine fracture. Electronically Signed   By: Titus Dubin M.D.   On: 11/03/2019 18:06   CT Angio Chest PE W and/or Wo Contrast  Result Date: 11/15/2019 CLINICAL DATA:  Shortness of breath, chest pain EXAM: CT ANGIOGRAPHY CHEST WITH CONTRAST TECHNIQUE: Multidetector CT imaging of the chest was performed using the standard protocol during bolus administration of intravenous contrast. Multiplanar CT image reconstructions and MIPs were obtained to evaluate the vascular anatomy. CONTRAST:  158m OMNIPAQUE IOHEXOL 350 MG/ML SOLN COMPARISON:  06/21/2012 FINDINGS: Cardiovascular: Bilateral moderate to large volume pulmonary emboli noted involving arterial branches in all lobes of both lungs. Evidence of right heart strain. RV/LV ratio 2.0. Heart is normal size. Aorta normal caliber. Mediastinum/Nodes: No mediastinal, hilar, or axillary adenopathy. Trachea and esophagus are unremarkable. Thyroid unremarkable. Lungs/Pleura: Left base atelectasis.  No effusions. Upper Abdomen: Imaging into the upper abdomen shows no acute findings. Musculoskeletal: Chest wall soft tissues are unremarkable. No acute bony abnormality. Review of the MIP images confirms the above findings. IMPRESSION: Positive for acute PE with CT evidence of right heart strain (RV/LV Ratio = 2.0) consistent with at least submassive (intermediate risk) PE. The presence of right heart strain has been associated with an increased risk of morbidity and mortality. Please activate Code PE by paging [phone] Critical Value/emergent results were called by telephone at the time of interpretation on 11/15/2019 at 9:31 pm [facility] who verbally acknowledged these results. Electronically Signed   By: KRolm BaptiseM.D.   On: 11/15/2019 21:32   CT Cervical  Spine Wo Contrast  Result Date: 11/03/2019 CLINICAL DATA:  Fall. Neck pain. History of prior stroke with left hemiparesis. EXAM: CT HEAD WITHOUT CONTRAST CT CERVICAL SPINE WITHOUT CONTRAST TECHNIQUE: Multidetector CT imaging of the head and cervical spine was performed following the standard protocol without intravenous contrast. Multiplanar CT image reconstructions of the cervical spine were also generated. COMPARISON:  CT neck dated June 21, 2012. CT head dated April 27, 2011. FINDINGS: CT HEAD FINDINGS Brain: No evidence of acute infarction, hemorrhage, hydrocephalus, extra-axial collection or mass lesion/mass effect. Chronic infarct in the right  basal ganglia and external capsule. Vascular: Atherosclerotic vascular calcification of the carotid siphons. No hyperdense vessel. Skull: Normal. Negative for fracture or focal lesion. Sinuses/Orbits: No acute finding. Other: None. CT CERVICAL SPINE FINDINGS Alignment: No traumatic malalignment. Skull base and vertebrae: No acute fracture. No primary bone lesion or focal pathologic process. Soft tissues and spinal canal: No prevertebral fluid or swelling. No visible canal hematoma. Disc levels: Disc heights are relatively preserved. Early uncovertebral hypertrophy at C3-C4 and C4-C5. Upper chest: Mild centrilobular emphysema. Other: Mildly enlarged heterogeneous thyroid gland without discrete nodule. IMPRESSION: 1. No acute intracranial abnormality. Chronic infarct in the right basal ganglia and external capsule. 2. No acute cervical spine fracture. Electronically Signed   By: Titus Dubin M.D.   On: 11/03/2019 18:06   DG Humerus Left  Result Date: 11/03/2019 CLINICAL DATA:  Left upper arm pain after ground level fall. EXAM: LEFT HUMERUS - 2+ VIEW COMPARISON:  None. FINDINGS: There is no evidence of fracture or other focal bone lesions. Soft tissues are unremarkable. IMPRESSION: Negative. Electronically Signed   By: Titus Dubin M.D.   On: 11/03/2019  18:12   ECHOCARDIOGRAM COMPLETE  Result Date: 11/16/2019    ECHOCARDIOGRAM REPORT   Patient Name:   CHRISHAUNA MEE Date of Exam: 11/16/2019 Medical Rec #:  [id number]         Height:       66.5 in Accession #:    [id number]        Weight:       187.4 lb Date of Birth:  [birth year]         BSA:          1.96 m Patient Age:    23 years          BP:           124/101 mmHg Patient Gender: F                 HR:           133 bpm. Exam Location:  Inpatient Procedure: 2D Echo, Color Doppler and Cardiac Doppler Indications:    I26.02 Pulmonary embolus  History:        Patient has no prior history of Echocardiogram examinations.                 COPD; Risk Factors:Hypertension and Diabetes.  Sonographer:    Raquel Sarna Senior RDCS Referring Phys: [place]  1. Left ventricular ejection fraction, by estimation, is 65 to 70%. The left ventricle has normal function. The left ventrical has no regional wall motion abnormalities. There is mildly increased left ventricular hypertrophy. Left ventricular diastolic parameters are indeterminate. Elevated left ventricular end-diastolic pressure.  2. Right ventricular systolic function is severely reduced. The right ventricular size is moderately enlarged. There is mildly elevated pulmonary artery systolic pressure. The estimated right ventricular systolic pressure is 00.7 mmHg.  3. Right atrial size was mild to moderately dilated.  4. No evidence of mitral valve regurgitation.  5. The aortic valve is normal in structure and function. Aortic valve regurgitation is not visualized. No aortic stenosis is present.  6. The inferior vena cava is normal in size with <50% respiratory variability, suggesting right atrial pressure of 8 mmHg. FINDINGS  Left Ventricle: Left ventricular ejection fraction, by estimation, is 65 to 70%. The left ventricle has normal function. The left ventricle has no regional wall motion abnormalities. The left ventricular internal cavity size was  normal in size. There  is  mildly increased left ventricular hypertrophy. Concentric left ventricular hypertrophy. Left ventricular diastolic parameters are indeterminate. Right Ventricle: The right ventricular size is moderately enlarged. No increase in right ventricular wall thickness. Right ventricular systolic function is severely reduced. There is mildly elevated pulmonary artery systolic pressure. The tricuspid regurgitant velocity is 2.70 m/s, and with an assumed right atrial pressure of 8 mmHg, the estimated right ventricular systolic pressure is 17.4 mmHg. Left Atrium: Left atrial size was normal in size. Right Atrium: Right atrial size was mild to moderately dilated. Pericardium: There is no evidence of pericardial effusion. Mitral Valve: The mitral valve is normal in structure and function. Normal mobility of the mitral valve leaflets. No evidence of mitral valve regurgitation. No evidence of mitral valve stenosis. Tricuspid Valve: The tricuspid valve is normal in structure. Tricuspid valve regurgitation is mild . No evidence of tricuspid stenosis. Aortic Valve: The aortic valve is normal in structure and function. Aortic valve regurgitation is not visualized. No aortic stenosis is present. Pulmonic Valve: The pulmonic valve was normal in structure. Pulmonic valve regurgitation is not visualized. No evidence of pulmonic stenosis. Aorta: The aortic root is normal in size and structure. Venous: The inferior vena cava is normal in size with less than 50% respiratory variability, suggesting right atrial pressure of 8 mmHg. The inferior vena cava and the hepatic vein show a normal flow pattern. IAS/Shunts: No atrial level shunt detected by color flow Doppler.  LEFT VENTRICLE PLAX 2D LVIDd:         3.17 cm  Diastology LVIDs:         2.24 cm  LV e' lateral:   2.61 cm/s LV PW:         1.28 cm  LV E/e' lateral: 27.7 LV IVS:        1.25 cm  LV e' medial:    3.59 cm/s LVOT diam:     1.90 cm  LV E/e' medial:  20.1 LV  SV:         20.41 ml LV SV Index:   11.42 LVOT Area:     2.84 cm  RIGHT VENTRICLE RV S prime:     4.90 cm/s TAPSE (M-mode): 0.9 cm LEFT ATRIUM             Index       RIGHT ATRIUM           Index LA diam:        2.50 cm 1.28 cm/m  RA Area:     17.00 cm LA Vol (A2C):   36.3 ml 18.56 ml/m RA Volume:   53.00 ml  27.09 ml/m LA Vol (A4C):   17.6 ml 9.00 ml/m LA Biplane Vol: 25.4 ml 12.98 ml/m  AORTIC VALVE LVOT Vmax:   56.90 cm/s LVOT Vmean:  39.500 cm/s LVOT VTI:    0.072 m  AORTA Ao Root diam: 2.70 cm Ao Asc diam:  2.90 cm MITRAL VALVE                        TRICUSPID VALVE MV Area (PHT): 8.43 cm             TR Peak grad:   29.2 mmHg MV Decel Time: 90 msec              TR Vmax:        270.00 cm/s MV E velocity: 72.30 cm/s 103 cm/s MV A velocity: 46.60 cm/s 70.3 cm/s SHUNTS MV E/A  ratio:  1.55       1.5       Systemic VTI:  0.07 m                                     Systemic Diam: 1.90 cm Fransico Him MD Electronically signed by Fransico Him MD Signature Date/Time: 11/16/2019/12:17:23 PM    Final    DG Hip Unilat W or Wo Pelvis 2-3 Views Left  Result Date: 11/03/2019 CLINICAL DATA:  Left hip pain after ground level fall. EXAM: DG HIP (WITH OR WITHOUT PELVIS) 2-3V LEFT COMPARISON:  CT abdomen pelvis dated August 25, 2019. FINDINGS: There is no evidence of hip fracture or dislocation. There is no evidence of arthropathy or other focal bone abnormality. IMPRESSION: Negative. Electronically Signed   By: Titus Dubin M.D.   On: 11/03/2019 18:13   VAS [place] LOWER EXTREMITY VENOUS (DVT)  Result Date: 11/16/2019  Lower Venous DVTStudy Indications: Pulmonary embolism.  Comparison Study: no prior Performing Technologist: Abram Sander RVS  Examination Guidelines: A complete evaluation includes B-mode imaging, spectral Doppler, color Doppler, and power Doppler as needed of all accessible portions of each vessel. Bilateral testing is considered an integral part of a complete examination. Limited examinations for  reoccurring indications may be performed as noted. The reflux portion of the exam is performed with the patient in reverse Trendelenburg.  +---------+---------------+---------+-----------+----------+--------------+ RIGHT    CompressibilityPhasicitySpontaneityPropertiesThrombus Aging +---------+---------------+---------+-----------+----------+--------------+ CFV      Full           Yes      Yes                                 +---------+---------------+---------+-----------+----------+--------------+ SFJ      Full                                                        +---------+---------------+---------+-----------+----------+--------------+ FV Prox  Full                                                        +---------+---------------+---------+-----------+----------+--------------+ FV Mid   Full                                                        +---------+---------------+---------+-----------+----------+--------------+ FV DistalFull                                                        +---------+---------------+---------+-----------+----------+--------------+ PFV      Full                                                        +---------+---------------+---------+-----------+----------+--------------+  POP      Full           Yes      Yes                                 +---------+---------------+---------+-----------+----------+--------------+ PTV      Full                                                        +---------+---------------+---------+-----------+----------+--------------+ PERO     Full                                                        +---------+---------------+---------+-----------+----------+--------------+   +---------+---------------+---------+-----------+----------+--------------+ LEFT     CompressibilityPhasicitySpontaneityPropertiesThrombus Aging  +---------+---------------+---------+-----------+----------+--------------+ CFV      Full           Yes      Yes                                 +---------+---------------+---------+-----------+----------+--------------+ SFJ      Full                                                        +---------+---------------+---------+-----------+----------+--------------+ FV Prox  None                                         Acute          +---------+---------------+---------+-----------+----------+--------------+ FV Mid   None                                         Acute          +---------+---------------+---------+-----------+----------+--------------+ FV DistalNone                                         Acute          +---------+---------------+---------+-----------+----------+--------------+ PFV      Full                                                        +---------+---------------+---------+-----------+----------+--------------+ POP      Full           Yes      Yes                                 +---------+---------------+---------+-----------+----------+--------------+ PTV  Full                                                        +---------+---------------+---------+-----------+----------+--------------+ PERO     Full                                                        +---------+---------------+---------+-----------+----------+--------------+     Summary: RIGHT: - There is no evidence of deep vein thrombosis in the lower extremity.  - No cystic structure found in the popliteal fossa.  LEFT: - Findings consistent with acute deep vein thrombosis involving the left femoral vein. - No cystic structure found in the popliteal fossa.  *See table(s) above for measurements and observations. Electronically signed by Deitra Mayo MD on 11/16/2019 at 12:14:25 PM.    Final    US Abdomen Limited RUQ  Result Date: 11/22/2019 CLINICAL DATA:   Abnormal LFTs. EXAM: ULTRASOUND ABDOMEN LIMITED RIGHT UPPER QUADRANT COMPARISON:  CT 08/25/2019 FINDINGS: Gallbladder: Partially distended. No gallstones. There is circumferential edematous gallbladder wall thickening up to 9 mm. No pericholecystic fluid. No sonographic Murphy sign noted by sonographer. Common bile duct: Diameter: 5 mm. Liver: No focal lesion identified. Within normal limits in parenchymal echogenicity. Portal vein is patent on color Doppler imaging with normal direction of blood flow towards the liver. Other: Right pleural effusion noted. IMPRESSION: 1. Partially distended gallbladder with edematous wall thickening up to 9 mm. No gallstones. Gallbladder wall thickening is nonspecific, favor systemic etiology. If there is clinical concern for acalculous cholecystitis, recommend further evaluation with nuclear medicine HIDA scan. 2. No biliary dilatation. 3. No focal hepatic lesion or findings to explain elevated LFTs. Electronically Signed   By: Keith Rake M.D.   On: 11/22/2019 05:18    Microbiology: Recent Results (from the past 240 hour(s))  Blood culture (routine x 2)     Status: None   Collection Time: 11/15/19  4:00 PM   Specimen: BLOOD RIGHT FOREARM  Result Value Ref Range Status   Specimen Description BLOOD RIGHT FOREARM  Final   Special Requests   Final    BOTTLES DRAWN AEROBIC AND ANAEROBIC Blood Culture results may not be optimal due to an inadequate volume of blood received in culture bottles   Culture   Final    NO GROWTH 5 DAYS Performed at [facility], [place] [address] [place], [place] [address]    Report Status 11/20/2019 FINAL  Final  Respiratory Panel by RT PCR (Flu A&B, Covid) - Nasopharyngeal Swab     Status: None   Collection Time: 11/15/19  4:19 PM   Specimen: Nasopharyngeal Swab  Result Value Ref Range Status   SARS Coronavirus 2 by RT PCR NEGATIVE NEGATIVE Final    Comment: (NOTE) SARS-CoV-2 target nucleic acids are NOT DETECTED. The  SARS-CoV-2 RNA is generally detectable in upper respiratoy specimens during the acute phase of infection. The lowest concentration of SARS-CoV-2 viral copies this assay can detect is 131 copies/mL. A negative result does not preclude SARS-Cov-2 infection and should not be used as the sole basis for treatment or other patient management decisions. A negative result may occur with  improper  specimen collection/handling, submission of specimen other than nasopharyngeal swab, presence of viral mutation(s) within the areas targeted by this assay, and inadequate number of viral copies (<131 copies/mL). A negative result must be combined with clinical observations, patient history, and epidemiological information. The expected result is Negative. Fact Sheet for Patients:  PinkCheek.be Fact Sheet for Healthcare Providers:  GravelBags.it This test is not yet ap proved or cleared by the [place] FDA and  has been authorized for detection and/or diagnosis of SARS-CoV-2 by FDA under an Emergency Use Authorization (EUA). This EUA will remain  in effect (meaning this test can be used) for the duration of the COVID-19 declaration under Section 564(b)(1) of the Act, 21 U.S.C. section 360bbb-3(b)(1), unless the authorization is terminated or revoked sooner.    Influenza A by PCR NEGATIVE NEGATIVE Final   Influenza B by PCR NEGATIVE NEGATIVE Final    Comment: (NOTE) The Xpert Xpress SARS-CoV-2/FLU/RSV assay is intended as an aid in  the diagnosis of influenza from Nasopharyngeal swab specimens and  should not be used as a sole basis for treatment. Nasal washings and  aspirates are unacceptable for Xpert Xpress SARS-CoV-2/FLU/RSV  testing. Fact Sheet for Patients: PinkCheek.be Fact Sheet for Healthcare Providers: GravelBags.it This test is not yet approved or cleared by the [place] FDA and  has been authorized for detection and/or diagnosis of SARS-CoV-2 by  FDA under an Emergency Use Authorization (EUA). This EUA will remain  in effect (meaning this test can be used) for the duration of the  Covid-19 declaration under Section 564(b)(1) of the Act, 21  U.S.C. section 360bbb-3(b)(1), unless the authorization is  terminated or revoked. Performed at [facility], [place] [address] [place], [place] [address]   Blood culture (routine x 2)     Status: None   Collection Time: 11/15/19  9:04 PM   Specimen: BLOOD  Result Value Ref Range Status   Specimen Description BLOOD RIGHT ANTECUBITAL  Final   Special Requests   Final    BOTTLES DRAWN AEROBIC AND ANAEROBIC Blood Culture adequate volume   Culture   Final    NO GROWTH 5 DAYS Performed at [facility], [place] [address] [place], [place] [address]    Report Status 11/20/2019 FINAL  Final  Urine culture     Status: None   Collection Time: 11/15/19  9:30 PM   Specimen: In/Out Cath Urine  Result Value Ref Range Status   Specimen Description IN/OUT CATH URINE  Final   Special Requests NONE  Final   Culture   Final    NO GROWTH Performed at [facility], [place] [address], [place],  [address]    Report Status 11/17/2019 FINAL  Final  SARS CORONAVIRUS 2 (TAT 6-24 HRS) Nasopharyngeal Nasopharyngeal Swab     Status: None   Collection Time: 11/22/19 10:12 AM   Specimen: Nasopharyngeal Swab  Result Value Ref Range Status   SARS Coronavirus 2 NEGATIVE NEGATIVE Final    Comment: (NOTE) SARS-CoV-2 target nucleic acids are NOT DETECTED. The SARS-CoV-2 RNA is generally detectable in upper and lower respiratory specimens during the acute phase of infection. Negative results do not preclude SARS-CoV-2 infection, do not rule out co-infections with other pathogens, and should not be used as the sole basis for treatment or other patient management decisions. Negative results must be combined with  clinical observations, patient history, and epidemiological information. The expected result is Negative. Fact Sheet for Patients: SugarRoll.be Fact Sheet for  Healthcare Providers: https://www.woods-mathews.com/ This test is not yet approved or cleared by the [place] and  has been authorized for detection and/or diagnosis of SARS-CoV-2 by FDA under an Emergency Use Authorization (EUA). This EUA will remain  in effect (meaning this test can be used) for the duration of the COVID-19 declaration under Section 56 4(b)(1) of the Act, 21 U.S.C. section 360bbb-3(b)(1), unless the authorization is terminated or revoked sooner. Performed at [facility], [place] [address], [place], [place] [address]      Labs: Basic Metabolic Panel: Recent Labs  Lab 11/17/19 0343 11/18/19 0104 11/19/19 0239 11/20/19 0334 11/22/19 0250  NA 133* 134* 136 134* 136  K 4.1 4.2 4.6 4.5 5.2*  CL 103 104 106 105 102  CO2 19* 19* 22 21* 24  GLUCOSE 94 103* 137* 106* 156*  BUN '19 20 19 18 15  '$ CREATININE 1.24* 1.11* 1.19* 1.13* 0.96  CALCIUM 8.7* 8.8* 8.7* 8.7* 8.8*  MG 2.2 1.9 2.2  --   --   PHOS 2.4* 2.0* 2.8  --   --    Liver Function Tests: Recent Labs  Lab 11/17/19 0343 11/18/19 0104 11/19/19 0239 11/21/19 0754  AST 182* 181* 86* 39  ALT 177* 194* 163* 86*  ALKPHOS 101 140* 176* 315*  BILITOT 0.4 0.4 0.5 0.4  PROT 5.8* 6.1* 6.0* 6.4*  ALBUMIN 2.7* 2.8* 2.7* 2.5*   No results for input(s): LIPASE, AMYLASE in the last 168 hours. No results for input(s): AMMONIA in the last 168 hours. CBC: Recent Labs  Lab 11/16/19 1449 11/17/19 0343 11/19/19 0239 11/20/19 0334 11/21/19 0241 11/22/19 0250 11/23/19 0320  WBC 17.9*   < > 10.6* 11.0* 9.1 9.3 9.6  NEUTROABS 11.5*  --   --   --   --   --   --   HGB 14.4   < > 12.4 12.1 11.7* 11.1* 11.5*  HCT 43.0   < > 37.5 36.5 36.5 35.5* 35.9*  MCV 84.0   < > 84.7 85.5 86.3 88.8 86.9  PLT 257   < >  224 244 231 218 242   < > = values in this interval not displayed.   Cardiac Enzymes: No results for input(s): CKTOTAL, CKMB, CKMBINDEX, TROPONINI in the last 168 hours. BNP: BNP (last 3 results) Recent Labs    11/15/19 2218 11/16/19 0306 11/16/19 1449  BNP 218.7* 296.1* 665.2*    ProBNP (last 3 results) No results for input(s): PROBNP in the last 8760 hours.  CBG: Recent Labs  Lab 11/22/19 1744 11/22/19 2026 11/23/19 0629 11/23/19 0917 11/23/19 1230  GLUCAP 82 132* 119* 241* 203*       Signed:  Annita Brod, MD [facility] 11/23/2019, 1:11 PM

## 2019-11-23 NOTE — Progress Notes (Signed)
Pt d/c to [facility] with all belongings by [facility]. Attempted to call report.  Theophilus Kinds, RN

## 2019-11-23 NOTE — TOC Progression Note (Signed)
Transition of Care [facility]) - Progression Note    Patient Details  Name: KHRISTA BRAUN MRN: [id number] Date of Birth: June 29, 1971  [facility] CM/SW Contact  Eduard Roux, [place] Phone Number: 11/23/2019, 11:57 AM  Clinical Narrative:     CSW has contacted [facility] - no response. CSW has reached out to [facility] supervisor  for assistance in reaching SNF.  Antony Blackbird, MSW, LCSWA Clinical Social Worker   Expected Discharge Plan: Skilled Nursing Facility Barriers to Discharge: Continued Medical Work up, SNF Pending bed offer  Expected Discharge Plan and Services Expected Discharge Plan: Skilled Nursing Facility In-house Referral: Clinical Social Work     Living arrangements for the past 2 months: Apartment                                       Social Determinants of Health (SDOH) Interventions    Readmission Risk Interventions No flowsheet data found.

## 2019-12-01 ENCOUNTER — Other Ambulatory Visit: Payer: Self-pay

## 2019-12-01 ENCOUNTER — Ambulatory Visit (INDEPENDENT_AMBULATORY_CARE_PROVIDER_SITE_OTHER): Payer: Medicaid Other | Admitting: Pulmonary Disease

## 2019-12-01 ENCOUNTER — Encounter: Payer: Self-pay | Admitting: Pulmonary Disease

## 2019-12-01 ENCOUNTER — Telehealth: Payer: Self-pay | Admitting: Pulmonary Disease

## 2019-12-01 VITALS — BP 120/74 | HR 78 | Temp 97.2°F | Ht 66.5 in | Wt 190.0 lb

## 2019-12-01 DIAGNOSIS — F17210 Nicotine dependence, cigarettes, uncomplicated: Secondary | ICD-10-CM | POA: Diagnosis not present

## 2019-12-01 DIAGNOSIS — E119 Type 2 diabetes mellitus without complications: Secondary | ICD-10-CM

## 2019-12-01 DIAGNOSIS — J449 Chronic obstructive pulmonary disease, unspecified: Secondary | ICD-10-CM | POA: Diagnosis not present

## 2019-12-01 DIAGNOSIS — Z8673 Personal history of transient ischemic attack (TIA), and cerebral infarction without residual deficits: Secondary | ICD-10-CM

## 2019-12-01 DIAGNOSIS — Z794 Long term (current) use of insulin: Secondary | ICD-10-CM

## 2019-12-01 DIAGNOSIS — J4489 Other specified chronic obstructive pulmonary disease: Secondary | ICD-10-CM

## 2019-12-01 DIAGNOSIS — Z720 Tobacco use: Secondary | ICD-10-CM

## 2019-12-01 DIAGNOSIS — I2699 Other pulmonary embolism without acute cor pulmonale: Secondary | ICD-10-CM

## 2019-12-01 DIAGNOSIS — I8290 Acute embolism and thrombosis of unspecified vein: Secondary | ICD-10-CM | POA: Insufficient documentation

## 2019-12-01 MED ORDER — NICOTINE 21 MG/24HR TD PT24: 21.0000 mg | MEDICATED_PATCH | Freq: Every day | TRANSDERMAL | 2 refills | Status: DC

## 2019-12-01 NOTE — Assessment & Plan Note (Signed)
Likely cause of patient's immobility Currently at [facility] rehab  Plan: Referred to community health and wellness to establish with his primary care

## 2019-12-01 NOTE — Telephone Encounter (Signed)
Patient is currently a resident at [facility]. Patient will need to have a PFT scheduled for April. While discharging the patient, she stated that [facility] tests her monthly for COVID. Advised her that I would call the faculty to see if I could get this worked out for her. She verbalized understanding.   Will also need the fax number to the nursing station or pharmacy for the nicotine patch RX.  Called [place] at [phone] but no one answered and I could not leave a message. Will attempt to call back later today.

## 2019-12-01 NOTE — Assessment & Plan Note (Signed)
Plan: Continue Eliquis Referral to hematology We will repeat echocardiogram in 3 months Follow-up with our office in 2 months

## 2019-12-01 NOTE — Patient Instructions (Addendum)
You were seen today by Lauraine Rinne, NP  for:   1. VTE (venous thromboembolism) 2. Bilateral pulmonary embolism ([place])  - Ambulatory referral to Hematology / Oncology - ECHOCARDIOGRAM COMPLETE; Future - VAS [place] LOWER EXTREMITY VENOUS (DVT); Future  We will check an echocardiogram of your heart in May/2021 We will check a ultrasound of your lower extremities in May/2021  Continue Eliquis as prescribed  We will also refer you to hematology  3. COPD with chronic bronchitis ([place])  - Pulmonary function test; Future - nicotine (NICODERM CQ) 21 mg/24hr patch; Place 1 patch (21 mg total) onto the skin daily.  Dispense: 28 patch; Refill: 2  Trelegy Ellipta  >>> 1 puff daily in the morning >>>rinse mouth out after use  >>> This inhaler contains 3 medications that help manage her respiratory status, contact our office if you cannot afford this medication or unable to remain on this medication  Note your daily symptoms > remember "red flags" for COPD:   >>>Increase in cough >>>increase in sputum production >>>increase in shortness of breath or activity  intolerance.   If you notice these symptoms, please call the office to be seen.   4. Tobacco abuse  Congratulations on stopping smoking!  - nicotine (NICODERM CQ) 21 mg/24hr patch; Place 1 patch (21 mg total) onto the skin daily.  Dispense: 28 patch; Refill: 2  Nicotine patches: >>>Make sure you rotate sites that you do not get skin irritation, Apply 1 patch each morning to a non-hairy skin site  If you are smoking greater than 10 cigarettes/day and weigh over 45 kg start with the nicotine patch of 21 mg a day for 6 weeks, then 14 mg a day for 2 weeks, then finished with 7 mg a day for 2 weeks, then stop  >>>If insomnia occurs you are having trouble sleeping you can take the patch off at night, and place a new one on in the morning >>>If the patch is removed at night and you have morning cravings start short acting nicotine replacement  therapy such as gum or lozenges  We recommend that you stop smoking.  >>>You need to set a quit date >>>If you have friends or family who smoke, let them know you are trying to quit and not to smoke around you or in your living environment  Smoking Cessation Resources:  1 800 QUIT NOW  >>> Patient to call this resource and utilize it to help support her quit smoking >>> Keep up your hard work with stopping smoking  You can also contact the [facility] >>>For smoking cessation classes call [phone]  We do not recommend using e-cigarettes as a form of stopping smoking  You can sign up for smoking cessation support texts and information:  >>>https://smokefree.gov/smokefreetxt  5. Type 2 diabetes mellitus without complication, with long-term current use of insulin ([place])  Please contact your primary care provider's office: Community health and wellness  You can request a new primary care provider  You need to have scheduled follow-up with primary care as they need to be managing your blood sugars  [facility] [place],  [place]  [address] Get Driving Directions Main: [phone]  Fax: [phone]   We recommend today:  Orders Placed This Encounter  Procedures  . Ambulatory referral to Hematology / Oncology    Referral Priority:   Routine    Referral Type:   Consultation    Referral Reason:   Specialty  Services Required    Number of Visits Requested:   1  . ECHOCARDIOGRAM COMPLETE    Standing Status:   Future    Standing Expiration Date:   02/28/2020    Scheduling Instructions:     Schedule around 02/19/20    Order Specific Question:   Where should this test be performed    Answer:   [facility])    Order Specific Question:   Does the patient weigh less than or greater than 250 lbs?    Answer:   Patient weighs less than 250 lbs    Order Specific Question:   Perflutren DEFINITY  (image enhancing agent) should be administered unless hypersensitivity or allergy exist    Answer:   Administer Perflutren    Order Specific Question:   Reason for exam-Echo    Answer:   Pulmonary Embolus  415.19 / I26.99  . Pulmonary function test    Standing Status:   Future    Standing Expiration Date:   11/30/2020    Order Specific Question:   Where should this test be performed?    Answer:   [facility]    Order Specific Question:   Full PFT: includes the following: basic spirometry, spirometry pre & post bronchodilator, diffusion capacity (DLCO), lung volumes    Answer:   Full PFT   Orders Placed This Encounter  Procedures  . Ambulatory referral to Hematology / Oncology  . ECHOCARDIOGRAM COMPLETE  . Pulmonary function test  . VAS [place] LOWER EXTREMITY VENOUS (DVT)   Meds ordered this encounter  Medications  . nicotine (NICODERM CQ) 21 mg/24hr patch    Sig: Place 1 patch (21 mg total) onto the skin daily.    Dispense:  28 patch    Refill:  2    Follow Up:    Return in about 2 months (around 01/29/2020), or if symptoms worsen or fail to improve, for Follow up with Dr. Dalbert Mayotte, Follow up for PFT.  MUST BE Dr. Marchelle Gearing to establish care, if nothing available schedule pft with Elisha Headland FNP AND appt with Dr. Marchelle Gearing in April/2021  Please do your part to reduce the spread of COVID-19:      Reduce your risk of any infection  and COVID19 by using the similar precautions used for avoiding the common cold or flu:  Marland Kitchen Wash your hands often with soap and warm water for at least 20 seconds.  If soap and water are not readily available, use an alcohol-based hand sanitizer with at least 60% alcohol.  . If coughing or sneezing, cover your mouth and nose by coughing or sneezing into the elbow areas of your shirt or coat, into a tissue or into your sleeve (not your hands). Drinda Butts A MASK when in public  . Avoid shaking hands with others and consider head nods or verbal greetings  only. . Avoid touching your eyes, nose, or mouth with unwashed hands.  . Avoid close contact with people who are sick. . Avoid places or events with large numbers of people in one location, like concerts or sporting events. . If you have some symptoms but not all symptoms, continue to monitor at home and seek medical attention if your symptoms worsen. . If you are having a medical emergency, call 911.   ADDITIONAL HEALTHCARE OPTIONS FOR PATIENTS  [place] [facility] / e-Visit: [email or web address]         [facility]: [phone]  [facility]: [phone]  [facility]: [phone]     It is flu season:   >>> Best ways to protect herself from the flu: Receive the yearly flu vaccine, practice good hand hygiene washing with soap and also using hand sanitizer when available, eat a nutritious meals, get adequate rest, hydrate appropriately   Please contact the office if your symptoms worsen or you have concerns that you are not improving.   Thank you for choosing [facility] for your healthcare, and for allowing [place] to partner with you on your healthcare journey. I am thankful to be able to provide care to you today.   Wyn Quaker FNP-C

## 2019-12-01 NOTE — Assessment & Plan Note (Signed)
Pt reporting issues with previous PCP  A1c elevated in hospital   Plan:  Refer to [facility] to establish with PCP

## 2019-12-01 NOTE — Assessment & Plan Note (Signed)
Plan: Continue to not smoke Printed prescription sent to [facility] so patient can be started on a 21 mg nicotine patch PFTs ordered Follow-up in 2 months

## 2019-12-01 NOTE — Assessment & Plan Note (Signed)
Plan:  Continue Trelegy Ellipta Continue to not smoke PFTs ordered  Follow up in 2 months to establish care with Dr. Dalbert Mayotte in slot

## 2019-12-01 NOTE — Assessment & Plan Note (Signed)
Plan: Echocardiogram in 3 months Lower extremity Dopplers in 3 months Referral to hematology Follow-up with our office in 2 months with pulmonary function testing to establish care with Dr. Marchelle Gearing Praised patient on stopping smoking Continue Eliquis

## 2019-12-01 NOTE — Progress Notes (Signed)
$'@Patient'Y$  ID: Jason Coop, female    DOB: September 18, 1971, 49 y.o.   MRN: [id number]  Chief Complaint  Patient presents with  . Hospitalization Follow-up    States she is still recovering from her stroke last year. Her breathing is slowly improving.     Referring provider: Kerin Perna, NP  HPI:  49 year old female current every day smoker followed in our office for COPD and History of PE in Feb/2021  PMH:  DM, HTN, prior CVA who presented to ED with CC chest pain.  Smoker/ Smoking History: Former Smoker. 36 pack year smoker.  Maintenance:  Trelegy Ellipta 100 Pt of: Dr. Chase Caller  12/01/2019  - Visit   49 year old female current smoker consulted with our practice on 11/16/2019 while inpatient for hospitalization regarding bilateral pulmonary embolism as well as a DVT in the left femoral vein.  She was admitted on 11/15/2019.  She was discharged on 11/23/2019.  She was initially started on a heparin drip and then transition to Eliquis.  Initial thoughts regarding VTE events was that this is directly related to her immobility/sedentary lifestyle.  Patient had 2 falls while in the hospital.  She was initially seen in the emergency room on 11/15/2019 and treated as a code sepsis.  CTA chest revealed bilateral PEs.   Patient continues to be in rehab at [facility].  She reports this is going well.  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  She has not worked since then.  It is believed that due to her immobility status post stroke that she likely developed lower extremity DVTs and bilateral PEs.  She continues to be maintained on Eliquis.  She has stopped smoking since being discharged from the hospital.  She reports that [facility] has not provided nicotine replacement patches for her.  Pt reports she is doing well but ready to be discharged from the facility she is at. She would like to establish with Dr. Chase Caller.  She is maintained on Trelegy Ellipta.  She  is a 36-pack-year smoking history.  She is believed to have COPD.  She has never had pulmonary function testing.   Questionaires / Pulmonary Flowsheets:   MMRC: mMRC Dyspnea Scale mMRC Score  12/01/2019 2    Tests:   11/15/2019-respiratory panel-SARS-CoV-2-negative, influenza A-negative, influenza B-negative  11/22/2019-SARS-CoV-2-negative  11/15/2019-CTA chest-positive for acute PE with CT evidence of right heart strain consistent with at least submassive PE  11/15/2019-D-dimer-1.9 11/15/2019-BNP-218 11/15/2019-troponin-239   11/16/2019-echocardiogram-LV ejection fraction 65 to 70%, right ventricle systolic function is severely reduced, right ventricular systolic pressure is 47.6  11/16/2019-lower extremity Doppler-right: There is no evidence of DVT in right lower extremity, left: Findings consistent with acute DVT involving the left femoral vein  FENO:  No results found for: NITRICOXIDE  PFT: No flowsheet data found.  WALK:  No flowsheet data found.  Imaging: DG Chest 2 View  Result Date: 11/15/2019 CLINICAL DATA:  Chest pain and shortness of breath EXAM: CHEST - 2 VIEW COMPARISON:  November 03, 2019 FINDINGS: There is mild left base atelectasis. The lungs elsewhere are clear. Heart size and pulmonary vascular normal. No adenopathy. No pneumothorax. There is mild degenerative change in the thoracic spine. IMPRESSION: Mild left base atelectasis. Lungs elsewhere clear. Cardiac silhouette within normal limits. Electronically Signed   By: Lowella Grip III M.D.   On: 11/15/2019 15:15   DG Ribs Unilateral W/Chest Left  Result Date: 11/03/2019 CLINICAL DATA:  Left-sided chest pain  after fall. EXAM: LEFT RIBS AND CHEST - 3+ VIEW COMPARISON:  Chest x-ray dated Feb 27, 2015. FINDINGS: No fracture or other bone lesions are seen involving the ribs. There is no evidence of pneumothorax or pleural effusion. Both lungs are clear. Heart size and mediastinal contours are within normal limits.  IMPRESSION: Negative. Electronically Signed   By: Titus Dubin M.D.   On: 11/03/2019 18:08   DG Thoracic Spine 2 View  Result Date: 11/03/2019 CLINICAL DATA:  Back pain after ground level fall. EXAM: THORACIC SPINE 2 VIEWS COMPARISON:  Chest x-ray dated Feb 28, 2015. FINDINGS: Twelve rib-bearing thoracic vertebral bodies. No acute fracture or subluxation. Vertebral body heights are preserved. Alignment is normal. Mild degenerative disc disease in the mid to lower thoracic spine. IMPRESSION: No acute osseous abnormality. Electronically Signed   By: Titus Dubin M.D.   On: 11/03/2019 18:16   DG Forearm Left  Result Date: 11/03/2019 CLINICAL DATA:  Left forearm pain after ground level fall. EXAM: LEFT FOREARM - 2 VIEW COMPARISON:  None. FINDINGS: There is no evidence of fracture or other focal bone lesions. Soft tissues are unremarkable. IMPRESSION: Negative. Electronically Signed   By: Titus Dubin M.D.   On: 11/03/2019 18:12   DG Knee 2 Views Left  Result Date: 11/03/2019 CLINICAL DATA:  Left knee pain after ground level fall. EXAM: LEFT KNEE - 1-2 VIEW COMPARISON:  None. FINDINGS: No evidence of fracture, dislocation, or joint effusion. No evidence of arthropathy or other focal bone abnormality. Soft tissues are unremarkable. IMPRESSION: Negative. Electronically Signed   By: Titus Dubin M.D.   On: 11/03/2019 18:14   DG Ankle Complete Left  Result Date: 11/03/2019 CLINICAL DATA:  Left ankle pain after ground level fall. EXAM: LEFT ANKLE COMPLETE - 3+ VIEW COMPARISON:  None. FINDINGS: No acute fracture or dislocation. The ankle mortise is symmetric. The talar dome is intact. No tibiotalar joint effusion. Joint spaces are preserved. Bone mineralization is normal. Small Achilles enthesophyte. Mild soft tissue swelling medially. IMPRESSION: No acute osseous abnormality. Electronically Signed   By: Titus Dubin M.D.   On: 11/03/2019 18:14   CT Head Wo Contrast  Result Date:  11/03/2019 CLINICAL DATA:  Fall. Neck pain. History of prior stroke with left hemiparesis. EXAM: CT HEAD WITHOUT CONTRAST CT CERVICAL SPINE WITHOUT CONTRAST TECHNIQUE: Multidetector CT imaging of the head and cervical spine was performed following the standard protocol without intravenous contrast. Multiplanar CT image reconstructions of the cervical spine were also generated. COMPARISON:  CT neck dated June 21, 2012. CT head dated April 27, 2011. FINDINGS: CT HEAD FINDINGS Brain: No evidence of acute infarction, hemorrhage, hydrocephalus, extra-axial collection or mass lesion/mass effect. Chronic infarct in the right basal ganglia and external capsule. Vascular: Atherosclerotic vascular calcification of the carotid siphons. No hyperdense vessel. Skull: Normal. Negative for fracture or focal lesion. Sinuses/Orbits: No acute finding. Other: None. CT CERVICAL SPINE FINDINGS Alignment: No traumatic malalignment. Skull base and vertebrae: No acute fracture. No primary bone lesion or focal pathologic process. Soft tissues and spinal canal: No prevertebral fluid or swelling. No visible canal hematoma. Disc levels: Disc heights are relatively preserved. Early uncovertebral hypertrophy at C3-C4 and C4-C5. Upper chest: Mild centrilobular emphysema. Other: Mildly enlarged heterogeneous thyroid gland without discrete nodule. IMPRESSION: 1. No acute intracranial abnormality. Chronic infarct in the right basal ganglia and external capsule. 2. No acute cervical spine fracture. Electronically Signed   By: Titus Dubin M.D.   On: 11/03/2019 18:06   CT Angio  Chest PE W and/or Wo Contrast  Result Date: 11/15/2019 CLINICAL DATA:  Shortness of breath, chest pain EXAM: CT ANGIOGRAPHY CHEST WITH CONTRAST TECHNIQUE: Multidetector CT imaging of the chest was performed using the standard protocol during bolus administration of intravenous contrast. Multiplanar CT image reconstructions and MIPs were obtained to evaluate the  vascular anatomy. CONTRAST:  198m OMNIPAQUE IOHEXOL 350 MG/ML SOLN COMPARISON:  06/21/2012 FINDINGS: Cardiovascular: Bilateral moderate to large volume pulmonary emboli noted involving arterial branches in all lobes of both lungs. Evidence of right heart strain. RV/LV ratio 2.0. Heart is normal size. Aorta normal caliber. Mediastinum/Nodes: No mediastinal, hilar, or axillary adenopathy. Trachea and esophagus are unremarkable. Thyroid unremarkable. Lungs/Pleura: Left base atelectasis.  No effusions. Upper Abdomen: Imaging into the upper abdomen shows no acute findings. Musculoskeletal: Chest wall soft tissues are unremarkable. No acute bony abnormality. Review of the MIP images confirms the above findings. IMPRESSION: Positive for acute PE with CT evidence of right heart strain (RV/LV Ratio = 2.0) consistent with at least submassive (intermediate risk) PE. The presence of right heart strain has been associated with an increased risk of morbidity and mortality. Please activate Code PE by paging [phone] Critical Value/emergent results were called by telephone at the time of interpretation on 11/15/2019 at 9:31 pm to [facility], who verbally acknowledged these results. Electronically Signed   By: KRolm BaptiseM.D.   On: 11/15/2019 21:32   CT Cervical Spine Wo Contrast  Result Date: 11/03/2019 CLINICAL DATA:  Fall. Neck pain. History of prior stroke with left hemiparesis. EXAM: CT HEAD WITHOUT CONTRAST CT CERVICAL SPINE WITHOUT CONTRAST TECHNIQUE: Multidetector CT imaging of the head and cervical spine was performed following the standard protocol without intravenous contrast. Multiplanar CT image reconstructions of the cervical spine were also generated. COMPARISON:  CT neck dated June 21, 2012. CT head dated April 27, 2011. FINDINGS: CT HEAD FINDINGS Brain: No evidence of acute infarction, hemorrhage, hydrocephalus, extra-axial collection or mass lesion/mass effect. Chronic infarct in the right  basal ganglia and external capsule. Vascular: Atherosclerotic vascular calcification of the carotid siphons. No hyperdense vessel. Skull: Normal. Negative for fracture or focal lesion. Sinuses/Orbits: No acute finding. Other: None. CT CERVICAL SPINE FINDINGS Alignment: No traumatic malalignment. Skull base and vertebrae: No acute fracture. No primary bone lesion or focal pathologic process. Soft tissues and spinal canal: No prevertebral fluid or swelling. No visible canal hematoma. Disc levels: Disc heights are relatively preserved. Early uncovertebral hypertrophy at C3-C4 and C4-C5. Upper chest: Mild centrilobular emphysema. Other: Mildly enlarged heterogeneous thyroid gland without discrete nodule. IMPRESSION: 1. No acute intracranial abnormality. Chronic infarct in the right basal ganglia and external capsule. 2. No acute cervical spine fracture. Electronically Signed   By: WTitus DubinM.D.   On: 11/03/2019 18:06   DG Humerus Left  Result Date: 11/03/2019 CLINICAL DATA:  Left upper arm pain after ground level fall. EXAM: LEFT HUMERUS - 2+ VIEW COMPARISON:  None. FINDINGS: There is no evidence of fracture or other focal bone lesions. Soft tissues are unremarkable. IMPRESSION: Negative. Electronically Signed   By: WTitus DubinM.D.   On: 11/03/2019 18:12   ECHOCARDIOGRAM COMPLETE  Result Date: 11/16/2019    ECHOCARDIOGRAM REPORT   Patient Name:   PVASILIA DISEDate of Exam: 11/16/2019 Medical Rec #:  [id number]        Height:       66.5 in Accession #:    [id number]       Weight:  187.4 lb Date of Birth:  Mar 20, 1971         BSA:          1.96 m Patient Age:    4 years          BP:           124/101 mmHg Patient Gender: F                 HR:           133 bpm. Exam Location:  Inpatient Procedure: 2D Echo, Color Doppler and Cardiac Doppler Indications:    I26.02 Pulmonary embolus  History:        Patient has no prior history of Echocardiogram examinations.                 COPD; Risk  Factors:Hypertension and Diabetes.  Sonographer:    Raquel Sarna Senior RDCS Referring Phys: [place]  1. Left ventricular ejection fraction, by estimation, is 65 to 70%. The left ventricle has normal function. The left ventrical has no regional wall motion abnormalities. There is mildly increased left ventricular hypertrophy. Left ventricular diastolic parameters are indeterminate. Elevated left ventricular end-diastolic pressure.  2. Right ventricular systolic function is severely reduced. The right ventricular size is moderately enlarged. There is mildly elevated pulmonary artery systolic pressure. The estimated right ventricular systolic pressure is 25.9 mmHg.  3. Right atrial size was mild to moderately dilated.  4. No evidence of mitral valve regurgitation.  5. The aortic valve is normal in structure and function. Aortic valve regurgitation is not visualized. No aortic stenosis is present.  6. The inferior vena cava is normal in size with <50% respiratory variability, suggesting right atrial pressure of 8 mmHg. FINDINGS  Left Ventricle: Left ventricular ejection fraction, by estimation, is 65 to 70%. The left ventricle has normal function. The left ventricle has no regional wall motion abnormalities. The left ventricular internal cavity size was normal in size. There is  mildly increased left ventricular hypertrophy. Concentric left ventricular hypertrophy. Left ventricular diastolic parameters are indeterminate. Right Ventricle: The right ventricular size is moderately enlarged. No increase in right ventricular wall thickness. Right ventricular systolic function is severely reduced. There is mildly elevated pulmonary artery systolic pressure. The tricuspid regurgitant velocity is 2.70 m/s, and with an assumed right atrial pressure of 8 mmHg, the estimated right ventricular systolic pressure is 56.3 mmHg. Left Atrium: Left atrial size was normal in size. Right Atrium: Right atrial size was  mild to moderately dilated. Pericardium: There is no evidence of pericardial effusion. Mitral Valve: The mitral valve is normal in structure and function. Normal mobility of the mitral valve leaflets. No evidence of mitral valve regurgitation. No evidence of mitral valve stenosis. Tricuspid Valve: The tricuspid valve is normal in structure. Tricuspid valve regurgitation is mild . No evidence of tricuspid stenosis. Aortic Valve: The aortic valve is normal in structure and function. Aortic valve regurgitation is not visualized. No aortic stenosis is present. Pulmonic Valve: The pulmonic valve was normal in structure. Pulmonic valve regurgitation is not visualized. No evidence of pulmonic stenosis. Aorta: The aortic root is normal in size and structure. Venous: The inferior vena cava is normal in size with less than 50% respiratory variability, suggesting right atrial pressure of 8 mmHg. The inferior vena cava and the hepatic vein show a normal flow pattern. IAS/Shunts: No atrial level shunt detected by color flow Doppler.  LEFT VENTRICLE PLAX 2D LVIDd:  3.17 cm  Diastology LVIDs:         2.24 cm  LV e' lateral:   2.61 cm/s LV PW:         1.28 cm  LV E/e' lateral: 27.7 LV IVS:        1.25 cm  LV e' medial:    3.59 cm/s LVOT diam:     1.90 cm  LV E/e' medial:  20.1 LV SV:         20.41 ml LV SV Index:   11.42 LVOT Area:     2.84 cm  RIGHT VENTRICLE RV S prime:     4.90 cm/s TAPSE (M-mode): 0.9 cm LEFT ATRIUM             Index       RIGHT ATRIUM           Index LA diam:        2.50 cm 1.28 cm/m  RA Area:     17.00 cm LA Vol (A2C):   36.3 ml 18.56 ml/m RA Volume:   53.00 ml  27.09 ml/m LA Vol (A4C):   17.6 ml 9.00 ml/m LA Biplane Vol: 25.4 ml 12.98 ml/m  AORTIC VALVE LVOT Vmax:   56.90 cm/s LVOT Vmean:  39.500 cm/s LVOT VTI:    0.072 m  AORTA Ao Root diam: 2.70 cm Ao Asc diam:  2.90 cm MITRAL VALVE                        TRICUSPID VALVE MV Area (PHT): 8.43 cm             TR Peak grad:   29.2 mmHg MV Decel  Time: 90 msec              TR Vmax:        270.00 cm/s MV E velocity: 72.30 cm/s 103 cm/s MV A velocity: 46.60 cm/s 70.3 cm/s SHUNTS MV E/A ratio:  1.55       1.5       Systemic VTI:  0.07 m                                     Systemic Diam: 1.90 cm Fransico Him MD Electronically signed by Fransico Him MD Signature Date/Time: 11/16/2019/12:17:23 PM    Final    DG Hip Unilat W or Wo Pelvis 2-3 Views Left  Result Date: 11/03/2019 CLINICAL DATA:  Left hip pain after ground level fall. EXAM: DG HIP (WITH OR WITHOUT PELVIS) 2-3V LEFT COMPARISON:  CT abdomen pelvis dated August 25, 2019. FINDINGS: There is no evidence of hip fracture or dislocation. There is no evidence of arthropathy or other focal bone abnormality. IMPRESSION: Negative. Electronically Signed   By: Titus Dubin M.D.   On: 11/03/2019 18:13   VAS [place] LOWER EXTREMITY VENOUS (DVT)  Result Date: 11/16/2019  Lower Venous DVTStudy Indications: Pulmonary embolism.  Comparison Study: no prior Performing Technologist: Abram Sander RVS  Examination Guidelines: A complete evaluation includes B-mode imaging, spectral Doppler, color Doppler, and power Doppler as needed of all accessible portions of each vessel. Bilateral testing is considered an integral part of a complete examination. Limited examinations for reoccurring indications may be performed as noted. The reflux portion of the exam is performed with the patient in reverse Trendelenburg.  +---------+---------------+---------+-----------+----------+--------------+ RIGHT    CompressibilityPhasicitySpontaneityPropertiesThrombus Aging +---------+---------------+---------+-----------+----------+--------------+ CFV  Full           Yes      Yes                                 +---------+---------------+---------+-----------+----------+--------------+ SFJ      Full                                                         +---------+---------------+---------+-----------+----------+--------------+ FV Prox  Full                                                        +---------+---------------+---------+-----------+----------+--------------+ FV Mid   Full                                                        +---------+---------------+---------+-----------+----------+--------------+ FV DistalFull                                                        +---------+---------------+---------+-----------+----------+--------------+ PFV      Full                                                        +---------+---------------+---------+-----------+----------+--------------+ POP      Full           Yes      Yes                                 +---------+---------------+---------+-----------+----------+--------------+ PTV      Full                                                        +---------+---------------+---------+-----------+----------+--------------+ PERO     Full                                                        +---------+---------------+---------+-----------+----------+--------------+   +---------+---------------+---------+-----------+----------+--------------+ LEFT     CompressibilityPhasicitySpontaneityPropertiesThrombus Aging +---------+---------------+---------+-----------+----------+--------------+ CFV      Full           Yes      Yes                                 +---------+---------------+---------+-----------+----------+--------------+  SFJ      Full                                                        +---------+---------------+---------+-----------+----------+--------------+ FV Prox  None                                         Acute          +---------+---------------+---------+-----------+----------+--------------+ FV Mid   None                                         Acute           +---------+---------------+---------+-----------+----------+--------------+ FV DistalNone                                         Acute          +---------+---------------+---------+-----------+----------+--------------+ PFV      Full                                                        +---------+---------------+---------+-----------+----------+--------------+ POP      Full           Yes      Yes                                 +---------+---------------+---------+-----------+----------+--------------+ PTV      Full                                                        +---------+---------------+---------+-----------+----------+--------------+ PERO     Full                                                        +---------+---------------+---------+-----------+----------+--------------+     Summary: RIGHT: - There is no evidence of deep vein thrombosis in the lower extremity.  - No cystic structure found in the popliteal fossa.  LEFT: - Findings consistent with acute deep vein thrombosis involving the left femoral vein. - No cystic structure found in the popliteal fossa.  *See table(s) above for measurements and observations. Electronically signed by Deitra Mayo MD on 11/16/2019 at 12:14:25 PM.    Final    US Abdomen Limited RUQ  Result Date: 11/22/2019 CLINICAL DATA:  Abnormal LFTs. EXAM: ULTRASOUND ABDOMEN LIMITED RIGHT UPPER QUADRANT COMPARISON:  CT 08/25/2019 FINDINGS: Gallbladder: Partially distended. No gallstones. There is circumferential edematous gallbladder wall thickening up to 9 mm. No pericholecystic fluid. No sonographic Murphy sign  noted by sonographer. Common bile duct: Diameter: 5 mm. Liver: No focal lesion identified. Within normal limits in parenchymal echogenicity. Portal vein is patent on color Doppler imaging with normal direction of blood flow towards the liver. Other: Right pleural effusion noted. IMPRESSION: 1. Partially distended gallbladder  with edematous wall thickening up to 9 mm. No gallstones. Gallbladder wall thickening is nonspecific, favor systemic etiology. If there is clinical concern for acalculous cholecystitis, recommend further evaluation with nuclear medicine HIDA scan. 2. No biliary dilatation. 3. No focal hepatic lesion or findings to explain elevated LFTs. Electronically Signed   By: Keith Rake M.D.   On: 11/22/2019 05:18    Lab Results:  CBC    Component Value Date/Time   WBC 9.6 11/23/2019 0320   RBC 4.13 11/23/2019 0320   HGB 11.5 (L) 11/23/2019 0320   HGB 13.8 12/10/2012 0626   HCT 35.9 (L) 11/23/2019 0320   HCT 40.8 12/10/2012 0626   PLT 242 11/23/2019 0320   PLT 257 12/10/2012 0626   MCV 86.9 11/23/2019 0320   MCV 81 12/10/2012 0626   MCH 27.8 11/23/2019 0320   MCHC 32.0 11/23/2019 0320   RDW 12.9 11/23/2019 0320   RDW 13.7 12/10/2012 0626   LYMPHSABS 4.8 (H) 11/16/2019 1449   LYMPHSABS 1.2 11/30/2012 0428   MONOABS 1.4 (H) 11/16/2019 1449   MONOABS 0.1 (L) 11/30/2012 0428   EOSABS 0.0 11/16/2019 1449   EOSABS 0.0 11/30/2012 0428   BASOSABS 0.1 11/16/2019 1449   BASOSABS 0.0 11/30/2012 0428    BMET    Component Value Date/Time   NA 136 11/22/2019 0250   NA 132 (L) 12/10/2012 0626   K 5.2 (H) 11/22/2019 0250   K 4.3 12/10/2012 0626   CL 102 11/22/2019 0250   CL 99 12/10/2012 0626   CO2 24 11/22/2019 0250   CO2 24 12/10/2012 0626   GLUCOSE 156 (H) 11/22/2019 0250   GLUCOSE 426 (H) 12/10/2012 0626   BUN 15 11/22/2019 0250   BUN 11 12/10/2012 0626   CREATININE 0.96 11/22/2019 0250   CREATININE 1.01 12/10/2012 0626   CALCIUM 8.8 (L) 11/22/2019 0250   CALCIUM 8.6 12/10/2012 0626   GFRNONAA >60 11/22/2019 0250   GFRNONAA >60 12/10/2012 0626   GFRAA >60 11/22/2019 0250   GFRAA >60 12/10/2012 0626    BNP    Component Value Date/Time   BNP 665.2 (H) 11/16/2019 1449    ProBNP    Component Value Date/Time   PROBNP 115.0 (H) 04/23/2009 0455    Specialty Problems       Pulmonary Problems   COPD with chronic bronchitis (HCC)    Former smoker 36 pack year smoking history           Allergies  Allergen Reactions  . Guaifenesin Anaphylaxis  . Levaquin [Levofloxacin In D5w] Shortness Of Breath and Rash  . Strawberry Extract Anaphylaxis  . Kiwi Extract Other (See Comments)    Other reaction(s): Other (See Comments)     Immunization History  Administered Date(s) Administered  . Influenza,inj,Quad PF,6+ Mos 07/08/2019  . PPD Test 07/21/2019  . Tdap 10/04/2019    Past Medical History:  Diagnosis Date  . Asthma   . COPD (chronic obstructive pulmonary disease) ([place])   . Diabetes mellitus without complication ([place])   . Hypertension   . Pulmonary embolism ([place]) 11/16/2019  . Stroke [facility])     Tobacco History: Social History   Tobacco Use  Smoking Status Former Smoker  . Packs/day: 1.75  .  Years: 36.00  . Pack years: 63.00  . Types: Cigarettes  . Start date: 10/08/1983  . Quit date: 11/22/2019  . Years since quitting: 0.0  Smokeless Tobacco Never Used   Counseling given: Yes  Smoking assessment and cessation counseling  Patient currently smoking: 0 cigarettes, recently stopped less than 1 month ago.  Interested in nicotine replacement today. I have advised the patient to quit/stop smoking as soon as possible due to high risk for multiple medical problems.  It will also be very difficult for [place] to manage patient's  respiratory symptoms and status if we continue to expose her lungs to a known irritant.  We do not advise e-cigarettes as a form of stopping smoking.  Patient is willing to quit smoking.  Successfully quit smoking during hospitalization.  She is interested in starting nicotine replacement patches which she had when she was in the hospital.  Printed prescription provided today which will be faxed to patient's rehab facility.  I have advised the patient that we can assist and have options of nicotine replacement therapy, provided  smoking cessation education today, provided smoking cessation counseling, and provided cessation resources.  Follow-up next office visit office visit for assessment of smoking cessation.    Smoking cessation counseling advised for: 4 min   Outpatient Encounter Medications as of 12/01/2019  Medication Sig  . albuterol (VENTOLIN HFA) 108 (90 Base) MCG/ACT inhaler Inhale 2 puffs into the lungs every 6 (six) hours as needed for wheezing or shortness of breath.  . ALPRAZolam (XANAX) 0.5 MG tablet Take 1 tablet (0.5 mg total) by mouth daily as needed for anxiety.  Marland Kitchen apixaban (ELIQUIS) 5 MG TABS tablet Take 2 tablets ('10mg'$ ) twice daily for 7 days, then 1 tablet ('5mg'$ ) twice daily  . atorvastatin (LIPITOR) 80 MG tablet Take 1 tablet (80 mg total) by mouth daily.  Marland Kitchen buPROPion (WELLBUTRIN SR) 100 MG 12 hr tablet Take 1 tablet (100 mg total) by mouth 2 (two) times daily.  . clopidogrel (PLAVIX) 75 MG tablet Take 1 tablet (75 mg total) by mouth daily.  . cyclobenzaprine (FLEXERIL) 10 MG tablet Take 1 tablet (10 mg total) by mouth 3 (three) times daily as needed for muscle spasms.  Marland Kitchen docusate sodium (COLACE) 100 MG capsule Take 100 mg by mouth 2 (two) times daily as needed for constipation.  . Fluticasone-Umeclidin-Vilant (TRELEGY ELLIPTA) 100-62.5-25 MCG/INH AEPB Inhale 1 puff into the lungs daily.  Marland Kitchen gabapentin (NEURONTIN) 300 MG capsule Take 1 capsule (300 mg total) by mouth 3 (three) times daily.  Marland Kitchen ibuprofen (ADVIL) 800 MG tablet Take 1 tablet (800 mg total) by mouth every 8 (eight) hours as needed. (Patient taking differently: Take 800 mg by mouth every 8 (eight) hours as needed for mild pain. )  . Insulin Glargine (BASAGLAR KWIKPEN) 100 UNIT/ML SOPN Inject 0.15 mLs (15 Units total) into the skin daily.  . insulin lispro (HUMALOG KWIKPEN) 100 UNIT/ML KwikPen Inject 0.01-0.05 mLs (1-5 Units total) into the skin 3 (three) times daily as needed. PRN BLOOD SUGAR 151-200 1 unit 201-250 2 units 251-300 3  units 301-350 4 units 351-399 5 units Over 400 call MD  . Insulin Pen Needle (PEN NEEDLES) 32G X 4 MM MISC 1 each by Does not apply route as needed.  Marland Kitchen ipratropium-albuterol (DUONEB) 0.5-2.5 (3) MG/3ML SOLN Inhale 3 mLs into the lungs 4 (four) times daily as needed. (Patient taking differently: Inhale 3 mLs into the lungs 4 (four) times daily as needed (shortness of breath). )  .  levothyroxine (SYNTHROID) 50 MCG tablet Take 1 tablet (50 mcg total) by mouth daily.  Marland Kitchen lidocaine (LIDODERM) 5 % Place 1 patch onto the skin every 12 (twelve) hours as needed.  . methylphenidate (RITALIN) 5 MG tablet Take 1 tablet (5 mg total) by mouth daily for 10 days.  . metoprolol tartrate (LOPRESSOR) 25 MG tablet Take 1 tablet (25 mg total) by mouth 2 (two) times daily.  . mirtazapine (REMERON) 7.5 MG tablet Take 1 tablet (7.5 mg total) by mouth at bedtime.  . naloxone (NARCAN) 4 MG/0.1ML LIQD nasal spray kit Place 1 spray into the nose once as needed. overdose  . nicotine (NICODERM CQ - DOSED IN MG/24 HOURS) 21 mg/24hr patch Place 21 mg onto the skin as needed. Smoking sensation  . omeprazole (PRILOSEC) 20 MG capsule Take 20 mg by mouth daily.  Marland Kitchen oxyCODONE (OXY IR/ROXICODONE) 5 MG immediate release tablet Take 1 tablet (5 mg total) by mouth every 4 (four) hours as needed for severe pain or breakthrough pain.  . polyethylene glycol (MIRALAX / GLYCOLAX) 17 g packet Take 17 g by mouth 2 (two) times daily as needed.  Marland Kitchen tetrahydrozoline 0.05 % ophthalmic solution Place 1 drop into both eyes 4 (four) times daily as needed. Eye irritation  . nicotine (NICODERM CQ) 21 mg/24hr patch Place 1 patch (21 mg total) onto the skin daily.   No facility-administered encounter medications on file as of 12/01/2019.     Review of Systems  Review of Systems  Constitutional: Positive for activity change. Negative for fatigue and fever.  HENT: Negative for sinus pressure, sinus pain and sore throat.   Respiratory: Negative for cough,  shortness of breath and wheezing.   Cardiovascular: Negative for chest pain and palpitations.  Musculoskeletal: Positive for gait problem.  Neurological: Positive for weakness. Negative for dizziness.  Psychiatric/Behavioral: Negative for sleep disturbance. The patient is not nervous/anxious.      Physical Exam  BP 120/74   Pulse 78   Temp (!) 97.2 F (36.2 C) (Temporal)   Ht 5' 6.5" (1.689 m)   Wt 190 lb (86.2 kg)   SpO2 98% Comment: on RA  BMI 30.21 kg/m   Wt Readings from Last 5 Encounters:  12/01/19 190 lb (86.2 kg)  11/15/19 187 lb 6.3 oz (85 kg)  11/03/19 187 lb (84.8 kg)  10/04/19 222 lb 12.8 oz (101.1 kg)    BMI Readings from Last 5 Encounters:  12/01/19 30.21 kg/m  11/15/19 29.79 kg/m  11/03/19 29.73 kg/m  10/04/19 35.42 kg/m     Physical Exam Vitals and nursing note reviewed.  Constitutional:      General: She is not in acute distress.    Appearance: Normal appearance. She is normal weight.  HENT:     Head: Normocephalic and atraumatic.     Right Ear: Tympanic membrane, ear canal and external ear normal. There is no impacted cerumen.     Left Ear: Tympanic membrane, ear canal and external ear normal. There is no impacted cerumen.     Nose: Nose normal. No congestion or rhinorrhea.     Mouth/Throat:     Mouth: Mucous membranes are moist.     Pharynx: Oropharynx is clear.  Eyes:     Pupils: Pupils are equal, round, and reactive to light.  Cardiovascular:     Rate and Rhythm: Normal rate and regular rhythm.     Pulses: Normal pulses.     Heart sounds: Normal heart sounds. No murmur.  Pulmonary:  Breath sounds: No decreased air movement. No decreased breath sounds, wheezing or rales.  Musculoskeletal:     Cervical back: Normal range of motion.     Right lower leg: Edema (2+) present.     Left lower leg: Edema (2-3+) present.  Skin:    General: Skin is warm and dry.     Capillary Refill: Capillary refill takes less than 2 seconds.   Neurological:     General: No focal deficit present.     Mental Status: She is alert and oriented to person, place, and time. Mental status is at baseline.     Motor: Weakness present.     Gait: Gait abnormal.  Psychiatric:        Mood and Affect: Mood normal.        Behavior: Behavior normal.        Thought Content: Thought content normal.        Judgment: Judgment normal.       Assessment & Plan:   VTE (venous thromboembolism) Plan: Echocardiogram in 3 months Lower extremity Dopplers in 3 months Referral to hematology Follow-up with our office in 2 months with pulmonary function testing to establish care with Dr. Chase Caller Praised patient on stopping smoking Continue Eliquis  Bilateral pulmonary embolism ([place]) Plan: Continue Eliquis Referral to hematology We will repeat echocardiogram in 3 months Follow-up with our office in 2 months  COPD with chronic bronchitis ([place]) Plan:  Continue Trelegy Ellipta Continue to not smoke PFTs ordered  Follow up in 2 months to establish care with Dr. Purnell Shoemaker in 28mn slot   [place] 2 [place] issues with previous PCP  A1c elevated in hospital   [facility]  [facility]   Tobacco abuse Plan: Continue to not smoke [facility] 21 mg [facility] PFTs ordered Follow-up in 2 months  History of stroke Likely cause of patient's immobility [facility]  Plan: Referred to community health and wellness to establish with his primary care    Return in about 2 months (around 01/29/2020), or if symptoms worsen or fail to improve, for Follow up with Dr. RPurnell Shoemaker 326m appt with MR not ILD slot, Follow up for PFT.   BrLauraine RinneNP 12/01/2019   This appointment required 45 minutes of patient care (this includes precharting, chart review, review of results, face-to-face  care, etc.).

## 2019-12-09 NOTE — Telephone Encounter (Signed)
Attempted to call the main number again. No one answered and I could not leave a message. Will call back later.

## 2019-12-27 ENCOUNTER — Ambulatory Visit (INDEPENDENT_AMBULATORY_CARE_PROVIDER_SITE_OTHER): Payer: Medicaid - Out of State | Admitting: Family Medicine

## 2020-01-31 ENCOUNTER — Ambulatory Visit: Payer: Medicaid - Out of State | Admitting: Internal Medicine

## 2020-02-01 ENCOUNTER — Ambulatory Visit: Payer: Medicaid - Out of State | Admitting: Internal Medicine

## 2020-02-21 ENCOUNTER — Other Ambulatory Visit (HOSPITAL_COMMUNITY): Payer: Medicaid - Out of State

## 2020-02-21 ENCOUNTER — Ambulatory Visit (HOSPITAL_COMMUNITY)
Admission: RE | Admit: 2020-02-21 | Payer: Medicaid Other | Source: Ambulatory Visit | Attending: Pulmonary Disease | Admitting: Pulmonary Disease

## 2020-02-23 ENCOUNTER — Telehealth: Payer: Self-pay | Admitting: Pulmonary Disease

## 2020-02-23 ENCOUNTER — Encounter: Payer: Self-pay | Admitting: Pulmonary Disease

## 2020-02-23 NOTE — Telephone Encounter (Signed)
02/23/2020  Received notification that patient's echocardiogram as well as lower extremity Dopplers have been missed.  Through chart review it appears the patient never completed pulmonary function testing. It also appears patient has had multiple hospitalizations and emergency room visits at a [employer] potentially based in [place].  If we can please try to follow-up with the patient to see if she still being followed in the [place] area if so she needs to have follow-up with Dr. Marchelle Gearing. If she is now living outside of the [place] area then we need to recommend or place a referral for pulmonary follow-up at her new location or she can discuss this with her discharging docs if she is still inpatient.  Elisha Headland, FNP

## 2020-02-23 NOTE — Telephone Encounter (Signed)
Attempted to call pt but unable to reach and unable to leave a VM. Will try to call back later. 

## 2020-02-24 ENCOUNTER — Encounter (HOSPITAL_COMMUNITY): Payer: Self-pay | Admitting: Pulmonary Disease

## 2020-02-24 ENCOUNTER — Telehealth (HOSPITAL_COMMUNITY): Payer: Self-pay | Admitting: Pulmonary Disease

## 2020-02-24 NOTE — Telephone Encounter (Signed)
ATC patient unable to reach LM to call back office (x2) No DPR to leave a detailed message But left message to call office back

## 2020-02-24 NOTE — Telephone Encounter (Signed)
Patient No Showed Echocardiogram and Vascular study on 02/21/20.  We have attempted to call patient without any success. I have now mailed a letter to call office to reschedule appointments.

## 2020-02-24 NOTE — Telephone Encounter (Signed)
Ok noted. Thank you Arlys John

## 2020-02-25 NOTE — Telephone Encounter (Signed)
ATC patient left a voicemail to call our office back(x3) Will send out a letter to patient to contact our office .

## 2020-02-28 ENCOUNTER — Ambulatory Visit (HOSPITAL_COMMUNITY)
Admission: RE | Admit: 2020-02-28 | Payer: Medicaid Other | Source: Ambulatory Visit | Attending: Pulmonary Disease | Admitting: Pulmonary Disease

## 2020-07-08 ENCOUNTER — Other Ambulatory Visit: Payer: Self-pay

## 2020-07-08 ENCOUNTER — Emergency Department
Admission: EM | Admit: 2020-07-08 | Discharge: 2020-07-08 | Disposition: A | Discharge status: Home / Self Care | Payer: Medicaid Other | Attending: Emergency Medicine | Admitting: Emergency Medicine

## 2020-07-08 ENCOUNTER — Encounter: Payer: Self-pay | Admitting: Radiology

## 2020-07-08 ENCOUNTER — Emergency Department: Payer: Medicaid Other

## 2020-07-08 DIAGNOSIS — Z87891 Personal history of nicotine dependence: Secondary | ICD-10-CM | POA: Diagnosis not present

## 2020-07-08 DIAGNOSIS — N3090 Cystitis, unspecified without hematuria: Secondary | ICD-10-CM | POA: Diagnosis not present

## 2020-07-08 DIAGNOSIS — J449 Chronic obstructive pulmonary disease, unspecified: Secondary | ICD-10-CM | POA: Insufficient documentation

## 2020-07-08 DIAGNOSIS — E119 Type 2 diabetes mellitus without complications: Secondary | ICD-10-CM | POA: Diagnosis not present

## 2020-07-08 DIAGNOSIS — J45909 Unspecified asthma, uncomplicated: Secondary | ICD-10-CM | POA: Insufficient documentation

## 2020-07-08 DIAGNOSIS — Z794 Long term (current) use of insulin: Secondary | ICD-10-CM | POA: Insufficient documentation

## 2020-07-08 DIAGNOSIS — R300 Dysuria: Secondary | ICD-10-CM | POA: Insufficient documentation

## 2020-07-08 DIAGNOSIS — I10 Essential (primary) hypertension: Secondary | ICD-10-CM | POA: Diagnosis not present

## 2020-07-08 DIAGNOSIS — Z7951 Long term (current) use of inhaled steroids: Secondary | ICD-10-CM | POA: Diagnosis not present

## 2020-07-08 LAB — CBC WITH DIFFERENTIAL/PLATELET
Abs Immature Granulocytes: 0.04 10*3/uL (ref 0.00–0.07)
Basophils Absolute: 0.1 10*3/uL (ref 0.0–0.1)
Basophils Relative: 1 %
Eosinophils Absolute: 0.2 10*3/uL (ref 0.0–0.5)
Eosinophils Relative: 2 %
HCT: 45.8 % (ref 36.0–46.0)
Hemoglobin: 15.1 g/dL — ABNORMAL HIGH (ref 12.0–15.0)
Immature Granulocytes: 0 %
Lymphocytes Relative: 40 %
Lymphs Abs: 4.4 10*3/uL — ABNORMAL HIGH (ref 0.7–4.0)
MCH: 27.4 pg (ref 26.0–34.0)
MCHC: 33 g/dL (ref 30.0–36.0)
MCV: 83.1 fL (ref 80.0–100.0)
Monocytes Absolute: 0.7 10*3/uL (ref 0.1–1.0)
Monocytes Relative: 7 %
Neutro Abs: 5.5 10*3/uL (ref 1.7–7.7)
Neutrophils Relative %: 50 %
Platelets: 375 10*3/uL (ref 150–400)
RBC: 5.51 MIL/uL — ABNORMAL HIGH (ref 3.87–5.11)
RDW: 13 % (ref 11.5–15.5)
WBC: 10.9 10*3/uL — ABNORMAL HIGH (ref 4.0–10.5)
nRBC: 0 % (ref 0.0–0.2)

## 2020-07-08 LAB — COMPREHENSIVE METABOLIC PANEL
ALT: 12 U/L (ref 0–44)
AST: 18 U/L (ref 15–41)
Albumin: 4.1 g/dL (ref 3.5–5.0)
Alkaline Phosphatase: 74 U/L (ref 38–126)
Anion gap: 10 (ref 5–15)
BUN: 19 mg/dL (ref 6–20)
CO2: 27 mmol/L (ref 22–32)
Calcium: 9.5 mg/dL (ref 8.9–10.3)
Chloride: 99 mmol/L (ref 98–111)
Creatinine, Ser: 1.06 mg/dL — ABNORMAL HIGH (ref 0.44–1.00)
GFR calc Af Amer: >60 mL/min (ref >60)
GFR calc non Af Amer: >60 mL/min (ref >60)
Glucose, Bld: 218 mg/dL — ABNORMAL HIGH (ref 70–99)
Potassium: 4.1 mmol/L (ref 3.5–5.1)
Sodium: 136 mmol/L (ref 135–145)
Total Bilirubin: 0.5 mg/dL (ref 0.3–1.2)
Total Protein: 7.8 g/dL (ref 6.5–8.1)

## 2020-07-08 LAB — URINALYSIS, ROUTINE W REFLEX MICROSCOPIC
Bilirubin Urine: NEGATIVE
Glucose, UA: 50 mg/dL — AB
Hgb urine dipstick: NEGATIVE
Ketones, ur: NEGATIVE mg/dL
Nitrite: NEGATIVE
Protein, ur: 100 mg/dL — AB
Specific Gravity, Urine: 1.015 (ref 1.005–1.030)
pH: 6 (ref 5.0–8.0)

## 2020-07-08 MED ORDER — IOHEXOL 300 MG/ML  SOLN
100.0000 mL | Freq: Once | INTRAMUSCULAR | Status: AC | PRN
  Administered 2020-07-08: 100 mL via INTRAVENOUS

## 2020-07-08 MED ORDER — SODIUM CHLORIDE 0.9 % IV SOLN
2.0000 g | Freq: Once | INTRAVENOUS | Status: AC
  Administered 2020-07-08: 2 g via INTRAVENOUS
  Filled 2020-07-08: qty 20

## 2020-07-08 MED ORDER — CEPHALEXIN 500 MG PO CAPS: 500.0000 mg | ORAL_CAPSULE | Freq: Four times a day (QID) | ORAL | 0 refills | Status: AC

## 2020-07-08 NOTE — ED Provider Notes (Signed)
[facility] Emergency Department Provider Note ____________________________________________   First MD Initiated Contact with Patient 07/08/20 [phone]     (approximate)  I have reviewed the triage vital signs and the nursing notes.  HISTORY  Chief Complaint Dysuria and Back Pain   HPI Karla Hoover is a 49 y.o. femalewho presents to the ED for evaluation of dysuria.  Chart review indicates history of PE on Eliquis, DM, COPD and CVA on ASA and Plavix. Recent evaluation by oncology 4 days ago due to incidentally found retroperitoneal lymphadenopathy on previous CT imaging and recurrent thromboembolism. Continued cigarette smoking.    Patient reports now living at home with her sister.  She reports 3 days of dysuria with associated bilateral flank pain.  She reports the flank pain is constant, mild and 3/10 intensity, worsened with urination up to 6/10 intensity.  This is bilateral and she cannot endorse one side worse than the other.  Denies hematuria, diarrhea, hematochezia, melena, vomiting, upper abdominal pain, chest pain, shortness of breath or fever.  Patient has never had a kidney stone.  She has not had antibiotics for these 2 months.  Past Medical History:  Diagnosis Date  . Asthma   . COPD (chronic obstructive pulmonary disease) ([place])   . Diabetes mellitus without complication ([place])   . Hypertension   . Pulmonary embolism ([place]) 11/16/2019  . Stroke [facility])     Patient Active Problem List   Diagnosis Date Noted  . VTE (venous thromboembolism) 12/01/2019  . Overweight (BMI 25.0-29.9) 11/22/2019  . Chronic constipation 11/22/2019  . Tobacco abuse 11/16/2019  . Elevated troponin 11/16/2019  . Bilateral pulmonary embolism ([place]) 11/15/2019  . DM (diabetes mellitus), type 2 ([place]) 11/15/2019  . History of stroke 11/15/2019  . AKI (acute kidney injury) ([place]) 11/15/2019  . Essential hypertension 05/04/2009  . COPD with chronic bronchitis ([place])  05/04/2009    No past surgical history on file.  Prior to Admission medications   Medication Sig Start Date End Date Taking? Authorizing Provider  albuterol (VENTOLIN HFA) 108 (90 Base) MCG/ACT inhaler Inhale 2 puffs into the lungs every 6 (six) hours as needed for wheezing or shortness of breath. 10/04/19   Kerin Perna, NP  ALPRAZolam Duanne Moron) 0.5 MG tablet Take 1 tablet (0.5 mg total) by mouth daily as needed for anxiety. 11/22/19   Annita Brod, MD  apixaban (ELIQUIS) 5 MG TABS tablet Take 2 tablets (70m) twice daily for 7 days, then 1 tablet (54m twice daily 11/21/19   HaKayleen MemosDO  atorvastatin (LIPITOR) 80 MG tablet Take 1 tablet (80 mg total) by mouth daily. 10/04/19   EdKerin PernaNP  buPROPion (WELLBUTRIN SR) 100 MG 12 hr tablet Take 1 tablet (100 mg total) by mouth 2 (two) times daily. 10/04/19   EdKerin PernaNP  cephALEXin (KEFLEX) 500 MG capsule Take 1 capsule (500 mg total) by mouth 4 (four) times daily for 5 days. 07/08/20 07/13/20  SmVladimir CroftsMD  clopidogrel (PLAVIX) 75 MG tablet Take 1 tablet (75 mg total) by mouth daily. 10/04/19   EdKerin PernaNP  cyclobenzaprine (FLEXERIL) 10 MG tablet Take 1 tablet (10 mg total) by mouth 3 (three) times daily as needed for muscle spasms. 10/04/19   EdKerin PernaNP  docusate sodium (COLACE) 100 MG capsule Take 100 mg by mouth 2 (two) times daily as needed for constipation. 08/23/19   [provider]  Fluticasone-Umeclidin-Vilant (TRELEGY ELLIPTA) 100-62.5-25 MCG/INH AEPB Inhale  1 puff into the lungs daily. 10/04/19   Kerin Perna, NP  gabapentin (NEURONTIN) 300 MG capsule Take 1 capsule (300 mg total) by mouth 3 (three) times daily. 11/11/19 12/11/19  Kerin Perna, NP  ibuprofen (ADVIL) 800 MG tablet Take 1 tablet (800 mg total) by mouth every 8 (eight) hours as needed. Patient taking differently: Take 800 mg by mouth every 8 (eight) hours as needed for mild pain.  10/04/19    Kerin Perna, NP  Insulin Glargine (BASAGLAR KWIKPEN) 100 UNIT/ML SOPN Inject 0.15 mLs (15 Units total) into the skin daily. 10/04/19   Kerin Perna, NP  insulin lispro (HUMALOG KWIKPEN) 100 UNIT/ML KwikPen Inject 0.01-0.05 mLs (1-5 Units total) into the skin 3 (three) times daily as needed. PRN BLOOD SUGAR 151-200 1 unit 201-250 2 units 251-300 3 units 301-350 4 units 351-399 5 units Over 400 call MD 10/04/19   Charlott Rakes, MD  Insulin Pen Needle (PEN NEEDLES) 32G X 4 MM MISC 1 each by Does not apply route as needed. 10/04/19   Kerin Perna, NP  ipratropium-albuterol (DUONEB) 0.5-2.5 (3) MG/3ML SOLN Inhale 3 mLs into the lungs 4 (four) times daily as needed. Patient taking differently: Inhale 3 mLs into the lungs 4 (four) times daily as needed (shortness of breath).  10/04/19   Kerin Perna, NP  levothyroxine (SYNTHROID) 50 MCG tablet Take 1 tablet (50 mcg total) by mouth daily. 10/07/19   Kerin Perna, NP  lidocaine (LIDODERM) 5 % Place 1 patch onto the skin every 12 (twelve) hours as needed. 09/09/19   Jacqlyn Larsen, PA-C  methylphenidate (RITALIN) 5 MG tablet Take 1 tablet (5 mg total) by mouth daily for 10 days. 11/21/19 12/01/19  Kayleen Memos, DO  metoprolol tartrate (LOPRESSOR) 25 MG tablet Take 1 tablet (25 mg total) by mouth 2 (two) times daily. 10/04/19   Kerin Perna, NP  mirtazapine (REMERON) 7.5 MG tablet Take 1 tablet (7.5 mg total) by mouth at bedtime. 11/21/19 12/21/19  Kayleen Memos, DO  naloxone [facility]) 4 MG/0.1ML LIQD nasal spray kit Place 1 spray into the nose once as needed. overdose 06/03/19   [provider]  nicotine (NICODERM CQ - DOSED IN MG/24 HOURS) 21 mg/24hr patch Place 21 mg onto the skin as needed. Smoking sensation 03/12/18   [provider]  nicotine (NICODERM CQ) 21 mg/24hr patch Place 1 patch (21 mg total) onto the skin daily. 12/01/19   Lauraine Rinne, NP  omeprazole (PRILOSEC) 20 MG capsule Take 20 mg by  mouth daily.    [provider]  oxyCODONE (OXY IR/ROXICODONE) 5 MG immediate release tablet Take 1 tablet (5 mg total) by mouth every 4 (four) hours as needed for severe pain or breakthrough pain. 11/22/19   Annita Brod, MD  polyethylene glycol (MIRALAX / GLYCOLAX) 17 g packet Take 17 g by mouth 2 (two) times daily as needed. 09/09/19   Jacqlyn Larsen, PA-C  tetrahydrozoline 0.05 % ophthalmic solution Place 1 drop into both eyes 4 (four) times daily as needed. Eye irritation 07/23/19   [provider]    Allergies Guaifenesin, Levaquin [levofloxacin in d5w], Strawberry extract, and Kiwi extract  Family History  Problem Relation Age of Onset  . Diabetes Mother   . COPD Sister   . Heart failure Sister     Social History Social History   Tobacco Use  . Smoking status: Former Smoker    Packs/day: 1.75  Years: 36.00    Pack years: 63.00    Types: Cigarettes    Start date: 10/08/1983    Quit date: 11/22/2019    Years since quitting: 0.6  . Smokeless tobacco: Never Used  Vaping Use  . Vaping Use: Every day  . Substances: Nicotine  Substance Use Topics  . Alcohol use: No  . Drug use: No    Review of Systems  Constitutional: No fever/chills Eyes: No visual changes. ENT: No sore throat. Cardiovascular: Denies chest pain. Respiratory: Denies shortness of breath. Gastrointestinal: No abdominal pain.  No nausea, no vomiting.  No diarrhea.  No constipation. Genitourinary: Positive for dysuria and flank pain Musculoskeletal: Negative for back pain. Skin: Negative for rash. Neurological: Negative for headaches, focal weakness or numbness.  ____________________________________________   PHYSICAL EXAM:  VITAL SIGNS: Vitals:   07/08/20 0351 07/08/20 0930  BP: (!) 163/97 134/84  Pulse: (!) 106 81  Resp: 19   Temp: 98.2 F (36.8 C)   SpO2: 99% 98%      Constitutional: Alert and oriented. Well appearing and in no acute distress. Eyes: Conjunctivae  are normal. PERRL. EOMI. Head: Atraumatic. Nose: No congestion/rhinnorhea. Mouth/Throat: Mucous membranes are moist.  Oropharynx non-erythematous. Neck: No stridor. No cervical spine tenderness to palpation. Cardiovascular: Normal rate, regular rhythm. Grossly normal heart sounds.  Good peripheral circulation. Respiratory: Normal respiratory effort.  No retractions. Lungs CTAB. Gastrointestinal: Soft , nondistended, nontender to palpation. No abdominal bruits.  Mild and vague bilateral CVA tenderness blending with paraspinal lumbar tenderness to palpation.  No spinal/midline tenderness throughout no spinal step-offs or signs of trauma to the back. Musculoskeletal: No lower extremity tenderness nor edema.  No joint effusions. No signs of acute trauma. Neurologic:  Normal speech and language. No new gross focal neurologic deficits are appreciated.  Skin:  Skin is warm, dry and intact. No rash noted. Psychiatric: Mood and affect are normal. Speech and behavior are normal.  ____________________________________________   LABS (all labs ordered are listed, but only abnormal results are displayed)  Labs Reviewed  CBC WITH DIFFERENTIAL/PLATELET - Abnormal; Notable for the following components:      Result Value   WBC 10.9 (*)    RBC 5.51 (*)    Hemoglobin 15.1 (*)    Lymphs Abs 4.4 (*)    All other components within normal limits  COMPREHENSIVE METABOLIC PANEL - Abnormal; Notable for the following components:   Glucose, Bld 218 (*)    Creatinine, Ser 1.06 (*)    All other components within normal limits  URINALYSIS, ROUTINE W REFLEX MICROSCOPIC - Abnormal; Notable for the following components:   Color, Urine YELLOW (*)    APPearance HAZY (*)    Glucose, UA 50 (*)    Protein, ur 100 (*)    Leukocytes,Ua TRACE (*)    Bacteria, UA MANY (*)    All other components within normal limits  URINE CULTURE   ____________________________________________  RADIOLOGY  ED MD interpretation:     Official radiology report(s): CT ABDOMEN PELVIS W CONTRAST  Result Date: 07/08/2020 CLINICAL DATA:  Abdominal pain, fever EXAM: CT ABDOMEN AND PELVIS WITH CONTRAST TECHNIQUE: Multidetector CT imaging of the abdomen and pelvis was performed using the standard protocol following bolus administration of intravenous contrast. CONTRAST:  130m OMNIPAQUE IOHEXOL 300 MG/ML  SOLN COMPARISON:  August 25, 2019. FINDINGS: Lower chest: Scattered atelectasis. Hepatobiliary: Focal fatty deposition adjacent to the falciform ligament. No gallstones, gallbladder wall thickening, or biliary dilatation. Pancreas: Unremarkable. No pancreatic ductal dilatation or  surrounding inflammatory changes. Spleen: Normal in size without focal abnormality. Adrenals/Urinary Tract: Adrenals are unremarkable. There is scarring and cortical thinning throughout the RIGHT kidney which is similar in comparison to prior study. Revisualization of a simple renal cyst of the superior pole of the RIGHT kidney. Hypoenhancement of the interpolar RIGHT kidney within scar is similar comparison to prior. Cortical thinning of the superior pole of the LEFT kidney, similar in comparison to prior. No nephrolithiasis. No hydronephrosis. Bladder is unremarkable. Stomach/Bowel: Stomach is within normal limits. Appendix appears normal. No evidence of bowel wall thickening, distention, or inflammatory changes. Vascular/Lymphatic: Aortic atherosclerosis. No enlarged abdominal or pelvic lymph nodes. Reproductive: Uterus is present. Small amount of fluid within the vaginal vault. Multiple subcentimeter LEFT adnexal cysts/follicles. Other: No free fluid. Musculoskeletal: No acute or significant osseous findings. IMPRESSION: 1. No acute abdominopelvic findings. 2. RIGHT greater than LEFT renal scarring and cortical thinning, similar in comparison to prior study. Aortic Atherosclerosis (ICD10-I70.0). Electronically Signed   By: Valentino Saxon MD   On: 07/08/2020  10:51   ____________________________________________   PROCEDURES and INTERVENTIONS  Procedure(s) performed (including Critical Care):  Procedures  Medications  cefTRIAXone (ROCEPHIN) 2 g in sodium chloride 0.9 % 100 mL IVPB (2 g Intravenous New Bag/Given 07/08/20 1008)  iohexol (OMNIPAQUE) 300 MG/ML solution 100 mL (100 mLs Intravenous Contrast Given 07/08/20 1026)    ____________________________________________   MDM / ED COURSE  49 year old wheelchair-bound patient with a history of CVA and unprovoked PE presents to the ED with a few days of dysuria, found to have evidence of acute cystitis with out evidence of pyelonephritis, and amenable to outpatient management.  Patient tachycardic in triage, otherwise normal vital signs.  Exam demonstrates patient's neurologic deficits at her baseline from previous CVA.  She has minimal suprapubic tenderness to palpation without peritoneal features, and her frontal abdomen is otherwise benign.  She has some vague and mild bilateral CVA tenderness that is difficult to distinguish from her chronic lumbar ago.  She overall looks well and is in no distress.  Blood work demonstrates minimal leukocytosis, otherwise blood work at her baseline.  Urine with infectious features.  Due to her comorbidities and possibility of obstructed ureteral stone with infection, CT imaging obtained and without evidence of urinary obstruction or other intra-abdominal acute pathology.  Patient was provided 1 dose of Rocephin in the ED and discharged with a course of Keflex, with directions to follow-up with her PCP in the next 5 days.  We discussed return precautions for the ED and outpatient management.  Patient medically stable for discharge home.      ____________________________________________   FINAL CLINICAL IMPRESSION(S) / ED DIAGNOSES  Final diagnoses:  Cystitis  Dysuria     ED Discharge Orders         Ordered    cephALEXin (KEFLEX) 500 MG capsule  4 times  daily        07/08/20 1110           Jakoby Melendrez   Note:  This document was prepared using Systems analyst and may include unintentional dictation errors.   Vladimir Crofts, MD 07/08/20 1114

## 2020-07-08 NOTE — ED Notes (Signed)
Pt transported to CT ?

## 2020-07-08 NOTE — ED Triage Notes (Signed)
Patient reports having pain with urination and bilateral flank pain for 3 days.

## 2020-07-08 NOTE — Discharge Instructions (Addendum)
You were seen in the ED because of your pain with urination.  You have evidence of a UTI, and so you are being discharged with a prescription for Keflex antibiotic to be taken 4 times per day for the next 5 days.  Please finish all 20 pills, even if your symptoms are improving.  If your symptoms worsen despite this medication, especially with fevers or worsening backslash flank pain, please return to the ED.

## 2020-07-10 LAB — URINE CULTURE: Culture: >=100000 — AB

## 2020-12-28 DIAGNOSIS — R591 Generalized enlarged lymph nodes: Secondary | ICD-10-CM | POA: Insufficient documentation

## 2020-12-28 NOTE — Progress Notes (Deleted)
[place]  Telephone:([phone]) [phone] Fax:(336) [phone]  ID: Karla Hoover OB: [birth year]  MR#: [id number]  TIW#:[id number]  Patient Care Team: Patient, No Pcp Per as PCP - General (General Practice)  CHIEF COMPLAINT:  Lymphadenopathy.  INTERVAL HISTORY: ***  REVIEW OF SYSTEMS:   ROS  As per HPI. Otherwise, a complete review of systems is negative.  PAST MEDICAL HISTORY: Past Medical History:  Diagnosis Date  . Asthma   . COPD (chronic obstructive pulmonary disease) ([place])   . Diabetes mellitus without complication ([place])   . Hypertension   . Pulmonary embolism ([place]) 11/16/2019  . Stroke [facility])     PAST SURGICAL HISTORY: No past surgical history on file.  FAMILY HISTORY: Family History  Problem Relation Age of Onset  . Diabetes Mother   . COPD Sister   . Heart failure Sister     ADVANCED DIRECTIVES (Y/N):  N  HEALTH MAINTENANCE: Social History   Tobacco Use  . Smoking status: Former Smoker    Packs/day: 1.75    Years: 36.00    Pack years: 63.00    Types: Cigarettes    Start date: 10/08/1983    Quit date: 11/22/2019    Years since quitting: 1.1  . Smokeless tobacco: Never Used  Vaping Use  . Vaping Use: Every day  . Substances: Nicotine  Substance Use Topics  . Alcohol use: No  . Drug use: No     Colonoscopy:  PAP:  Bone density:  Lipid panel:  Allergies  Allergen Reactions  . Guaifenesin Anaphylaxis  . Levaquin [Levofloxacin In D5w] Shortness Of Breath and Rash  . Strawberry Extract Anaphylaxis  . Kiwi Extract Other (See Comments)    Other reaction(s): Other (See Comments)     Current Outpatient Medications  Medication Sig Dispense Refill  . albuterol (VENTOLIN HFA) 108 (90 Base) MCG/ACT inhaler Inhale 2 puffs into the lungs every 6 (six) hours as needed for wheezing or shortness of breath. 8 g 1  . ALPRAZolam (XANAX) 0.5 MG tablet Take 1 tablet (0.5 mg total) by mouth daily as needed for anxiety. 10 tablet 0  .  apixaban (ELIQUIS) 5 MG TABS tablet Take 2 tablets (39m) twice daily for 7 days, then 1 tablet (533m twice daily 60 tablet 0  . atorvastatin (LIPITOR) 80 MG tablet Take 1 tablet (80 mg total) by mouth daily. 30 tablet 5  . buPROPion (WELLBUTRIN SR) 100 MG 12 hr tablet Take 1 tablet (100 mg total) by mouth 2 (two) times daily. 60 tablet 5  . clopidogrel (PLAVIX) 75 MG tablet Take 1 tablet (75 mg total) by mouth daily. 30 tablet 5  . cyclobenzaprine (FLEXERIL) 10 MG tablet Take 1 tablet (10 mg total) by mouth 3 (three) times daily as needed for muscle spasms. 30 tablet 5  . docusate sodium (COLACE) 100 MG capsule Take 100 mg by mouth 2 (two) times daily as needed for constipation.    . Fluticasone-Umeclidin-Vilant (TRELEGY ELLIPTA) 100-62.5-25 MCG/INH AEPB Inhale 1 puff into the lungs daily. 28 each 5  . gabapentin (NEURONTIN) 300 MG capsule Take 1 capsule (300 mg total) by mouth 3 (three) times daily. 90 capsule 5  . ibuprofen (ADVIL) 800 MG tablet Take 1 tablet (800 mg total) by mouth every 8 (eight) hours as needed. (Patient taking differently: Take 800 mg by mouth every 8 (eight) hours as needed for mild pain. ) 30 tablet 2  . Insulin Glargine (BASAGLAR KWIKPEN) 100 UNIT/ML SOPN Inject 0.15 mLs (15 Units total)  into the skin daily. 6 mL 3  . insulin lispro (HUMALOG KWIKPEN) 100 UNIT/ML KwikPen Inject 0.01-0.05 mLs (1-5 Units total) into the skin 3 (three) times daily as needed. PRN BLOOD SUGAR 151-200 1 unit 201-250 2 units 251-300 3 units 301-350 4 units 351-399 5 units Over 400 call MD 15 mL 2  . Insulin Pen Needle (PEN NEEDLES) 32G X 4 MM MISC 1 each by Does not apply route as needed. 200 each 0  . ipratropium-albuterol (DUONEB) 0.5-2.5 (3) MG/3ML SOLN Inhale 3 mLs into the lungs 4 (four) times daily as needed. (Patient taking differently: Inhale 3 mLs into the lungs 4 (four) times daily as needed (shortness of breath). ) 360 mL 3  . levothyroxine (SYNTHROID) 50 MCG tablet Take 1 tablet (50 mcg  total) by mouth daily. 30 tablet 1  . lidocaine (LIDODERM) 5 % Place 1 patch onto the skin every 12 (twelve) hours as needed. 30 patch 0  . methylphenidate (RITALIN) 5 MG tablet Take 1 tablet (5 mg total) by mouth daily for 10 days. 10 tablet 0  . metoprolol tartrate (LOPRESSOR) 25 MG tablet Take 1 tablet (25 mg total) by mouth 2 (two) times daily. 60 tablet 5  . mirtazapine (REMERON) 7.5 MG tablet Take 1 tablet (7.5 mg total) by mouth at bedtime. 30 tablet 0  . naloxone (NARCAN) 4 MG/0.1ML LIQD nasal spray kit Place 1 spray into the nose once as needed. overdose    . nicotine (NICODERM CQ - DOSED IN MG/24 HOURS) 21 mg/24hr patch Place 21 mg onto the skin as needed. Smoking sensation    . nicotine (NICODERM CQ) 21 mg/24hr patch Place 1 patch (21 mg total) onto the skin daily. 28 patch 2  . omeprazole (PRILOSEC) 20 MG capsule Take 20 mg by mouth daily.    Marland Kitchen oxyCODONE (OXY IR/ROXICODONE) 5 MG immediate release tablet Take 1 tablet (5 mg total) by mouth every 4 (four) hours as needed for severe pain or breakthrough pain. 10 tablet 0  . polyethylene glycol (MIRALAX / GLYCOLAX) 17 g packet Take 17 g by mouth 2 (two) times daily as needed. 30 each 0  . tetrahydrozoline 0.05 % ophthalmic solution Place 1 drop into both eyes 4 (four) times daily as needed. Eye irritation     No current facility-administered medications for this visit.    OBJECTIVE: There were no vitals filed for this visit.   There is no height or weight on file to calculate BMI.    ECOG FS:{CHL ONC [id number]  General: Well-developed, well-nourished, no acute distress. Eyes: Pink conjunctiva, anicteric sclera. HEENT: Normocephalic, moist mucous membranes. Lungs: No audible wheezing or coughing. Heart: Regular rate and rhythm. Abdomen: Soft, nontender, no obvious distention. Musculoskeletal: No edema, cyanosis, or clubbing. Neuro: Alert, answering all questions appropriately. Cranial nerves grossly intact. Skin: No rashes or  petechiae noted. Psych: Normal affect. Lymphatics: No cervical, calvicular, axillary or inguinal LAD.   LAB RESULTS:  Lab Results  Component Value Date   NA 136 07/08/2020   K 4.1 07/08/2020   CL 99 07/08/2020   CO2 27 07/08/2020   GLUCOSE 218 (H) 07/08/2020   BUN 19 07/08/2020   CREATININE 1.06 (H) 07/08/2020   CALCIUM 9.5 07/08/2020   PROT 7.8 07/08/2020   ALBUMIN 4.1 07/08/2020   AST 18 07/08/2020   ALT 12 07/08/2020   ALKPHOS 74 07/08/2020   BILITOT 0.5 07/08/2020   GFRNONAA >60 07/08/2020   GFRAA >60 07/08/2020    Lab Results  Component  Value Date   WBC 10.9 (H) 07/08/2020   NEUTROABS 5.5 07/08/2020   HGB 15.1 (H) 07/08/2020   HCT 45.8 07/08/2020   MCV 83.1 07/08/2020   PLT 375 07/08/2020     STUDIES: No results found.  ASSESSMENT: Lymphadenopathy.  PLAN:    1.  Lymphadenopathy:  Patient expressed understanding and was in agreement with this plan. She also understands that She can call clinic at any time with any questions, concerns, or complaints.   Cancer Staging No matching staging information was found for the patient.  Lloyd Huger, MD   12/28/2020 10:28 PM

## 2021-01-04 ENCOUNTER — Inpatient Hospital Stay: Payer: Medicaid Other | Attending: Oncology | Admitting: Oncology

## 2021-01-04 ENCOUNTER — Inpatient Hospital Stay: Payer: Medicaid Other

## 2021-01-04 DIAGNOSIS — R591 Generalized enlarged lymph nodes: Secondary | ICD-10-CM

## 2021-01-09 ENCOUNTER — Ambulatory Visit (LOCAL_COMMUNITY_HEALTH_CENTER): Payer: Medicaid Other

## 2021-01-09 ENCOUNTER — Other Ambulatory Visit: Payer: Self-pay

## 2021-01-09 DIAGNOSIS — Z111 Encounter for screening for respiratory tuberculosis: Secondary | ICD-10-CM

## 2021-01-09 NOTE — Progress Notes (Signed)
Pt with mother today for PPD which she needs for nursing home placement on 01/12/2021. Requests PPDR at 48 hrs on 01/11/2021 d/t pt moving out of current home and moving into nursing home this week. Consult with TB Coordinator, M. Dorminy, RN who explains that PPD may be read at 48 hrs at the earliest. RN explained to pt and PPDR appt given 01/11/2021 at 9 am. Jerel Shepherd, RN

## 2021-01-11 ENCOUNTER — Ambulatory Visit (LOCAL_COMMUNITY_HEALTH_CENTER): Payer: Medicaid Other

## 2021-01-11 ENCOUNTER — Other Ambulatory Visit: Payer: Self-pay

## 2021-01-11 DIAGNOSIS — Z111 Encounter for screening for respiratory tuberculosis: Secondary | ICD-10-CM

## 2021-01-11 LAB — TB SKIN TEST
Induration: 0 mm
TB Skin Test: NEGATIVE

## 2021-01-12 ENCOUNTER — Other Ambulatory Visit: Payer: Self-pay

## 2021-02-08 ENCOUNTER — Emergency Department (HOSPITAL_COMMUNITY): Payer: Medicaid Other

## 2021-02-08 ENCOUNTER — Inpatient Hospital Stay (HOSPITAL_COMMUNITY)
Admission: EM | Admit: 2021-02-08 | Discharge: 2021-02-12 | DRG: 872 | Disposition: A | Discharge status: Skilled Nursing Facility | Payer: Medicaid Other | Source: Skilled Nursing Facility | Attending: Internal Medicine | Admitting: Internal Medicine

## 2021-02-08 DIAGNOSIS — E1140 Type 2 diabetes mellitus with diabetic neuropathy, unspecified: Secondary | ICD-10-CM | POA: Diagnosis present

## 2021-02-08 DIAGNOSIS — N390 Urinary tract infection, site not specified: Secondary | ICD-10-CM

## 2021-02-08 DIAGNOSIS — J9611 Chronic respiratory failure with hypoxia: Secondary | ICD-10-CM | POA: Diagnosis present

## 2021-02-08 DIAGNOSIS — J449 Chronic obstructive pulmonary disease, unspecified: Secondary | ICD-10-CM | POA: Diagnosis present

## 2021-02-08 DIAGNOSIS — E1122 Type 2 diabetes mellitus with diabetic chronic kidney disease: Secondary | ICD-10-CM

## 2021-02-08 DIAGNOSIS — E1165 Type 2 diabetes mellitus with hyperglycemia: Secondary | ICD-10-CM | POA: Diagnosis present

## 2021-02-08 DIAGNOSIS — E785 Hyperlipidemia, unspecified: Secondary | ICD-10-CM | POA: Diagnosis present

## 2021-02-08 DIAGNOSIS — E869 Volume depletion, unspecified: Secondary | ICD-10-CM | POA: Diagnosis present

## 2021-02-08 DIAGNOSIS — K5909 Other constipation: Secondary | ICD-10-CM | POA: Diagnosis present

## 2021-02-08 DIAGNOSIS — Z888 Allergy status to other drugs, medicaments and biological substances status: Secondary | ICD-10-CM

## 2021-02-08 DIAGNOSIS — N1831 Chronic kidney disease, stage 3a: Secondary | ICD-10-CM | POA: Diagnosis present

## 2021-02-08 DIAGNOSIS — R000 Tachycardia, unspecified: Secondary | ICD-10-CM

## 2021-02-08 DIAGNOSIS — Z7982 Long term (current) use of aspirin: Secondary | ICD-10-CM

## 2021-02-08 DIAGNOSIS — J4489 Other specified chronic obstructive pulmonary disease: Secondary | ICD-10-CM | POA: Diagnosis present

## 2021-02-08 DIAGNOSIS — Z20822 Contact with and (suspected) exposure to covid-19: Secondary | ICD-10-CM | POA: Diagnosis present

## 2021-02-08 DIAGNOSIS — Z79899 Other long term (current) drug therapy: Secondary | ICD-10-CM

## 2021-02-08 DIAGNOSIS — Z91018 Allergy to other foods: Secondary | ICD-10-CM

## 2021-02-08 DIAGNOSIS — E782 Mixed hyperlipidemia: Secondary | ICD-10-CM | POA: Diagnosis present

## 2021-02-08 DIAGNOSIS — R0902 Hypoxemia: Secondary | ICD-10-CM

## 2021-02-08 DIAGNOSIS — Z7901 Long term (current) use of anticoagulants: Secondary | ICD-10-CM

## 2021-02-08 DIAGNOSIS — Z87891 Personal history of nicotine dependence: Secondary | ICD-10-CM

## 2021-02-08 DIAGNOSIS — J9811 Atelectasis: Secondary | ICD-10-CM | POA: Diagnosis present

## 2021-02-08 DIAGNOSIS — Z86711 Personal history of pulmonary embolism: Secondary | ICD-10-CM

## 2021-02-08 DIAGNOSIS — Z7401 Bed confinement status: Secondary | ICD-10-CM

## 2021-02-08 DIAGNOSIS — N12 Tubulo-interstitial nephritis, not specified as acute or chronic: Secondary | ICD-10-CM | POA: Diagnosis present

## 2021-02-08 DIAGNOSIS — N179 Acute kidney failure, unspecified: Secondary | ICD-10-CM | POA: Diagnosis present

## 2021-02-08 DIAGNOSIS — Z833 Family history of diabetes mellitus: Secondary | ICD-10-CM

## 2021-02-08 DIAGNOSIS — Z86718 Personal history of other venous thrombosis and embolism: Secondary | ICD-10-CM

## 2021-02-08 DIAGNOSIS — Z794 Long term (current) use of insulin: Secondary | ICD-10-CM

## 2021-02-08 DIAGNOSIS — K219 Gastro-esophageal reflux disease without esophagitis: Secondary | ICD-10-CM | POA: Diagnosis present

## 2021-02-08 DIAGNOSIS — Z79890 Hormone replacement therapy (postmenopausal): Secondary | ICD-10-CM

## 2021-02-08 DIAGNOSIS — E871 Hypo-osmolality and hyponatremia: Secondary | ICD-10-CM | POA: Diagnosis present

## 2021-02-08 DIAGNOSIS — E039 Hypothyroidism, unspecified: Secondary | ICD-10-CM | POA: Diagnosis present

## 2021-02-08 DIAGNOSIS — A419 Sepsis, unspecified organism: Principal | ICD-10-CM | POA: Diagnosis present

## 2021-02-08 DIAGNOSIS — I129 Hypertensive chronic kidney disease with stage 1 through stage 4 chronic kidney disease, or unspecified chronic kidney disease: Secondary | ICD-10-CM | POA: Diagnosis present

## 2021-02-08 DIAGNOSIS — Z825 Family history of asthma and other chronic lower respiratory diseases: Secondary | ICD-10-CM

## 2021-02-08 DIAGNOSIS — E1169 Type 2 diabetes mellitus with other specified complication: Secondary | ICD-10-CM | POA: Diagnosis present

## 2021-02-08 DIAGNOSIS — Z7984 Long term (current) use of oral hypoglycemic drugs: Secondary | ICD-10-CM

## 2021-02-08 DIAGNOSIS — Z8249 Family history of ischemic heart disease and other diseases of the circulatory system: Secondary | ICD-10-CM

## 2021-02-08 DIAGNOSIS — I10 Essential (primary) hypertension: Secondary | ICD-10-CM | POA: Diagnosis present

## 2021-02-08 DIAGNOSIS — I69354 Hemiplegia and hemiparesis following cerebral infarction affecting left non-dominant side: Secondary | ICD-10-CM

## 2021-02-08 HISTORY — DX: Sepsis, unspecified organism: A41.9

## 2021-02-08 HISTORY — DX: Sepsis, unspecified organism: N39.0

## 2021-02-08 LAB — URINALYSIS, ROUTINE W REFLEX MICROSCOPIC
Bilirubin Urine: NEGATIVE
Glucose, UA: NEGATIVE mg/dL
Ketones, ur: NEGATIVE mg/dL
Nitrite: NEGATIVE
Protein, ur: 100 mg/dL — AB
RBC / HPF: >50 RBC/hpf — ABNORMAL HIGH (ref 0–5)
Specific Gravity, Urine: 1.019 (ref 1.005–1.030)
WBC, UA: >50 WBC/hpf — ABNORMAL HIGH (ref 0–5)
pH: 5 (ref 5.0–8.0)

## 2021-02-08 LAB — COMPREHENSIVE METABOLIC PANEL
ALT: 17 U/L (ref 0–44)
AST: 22 U/L (ref 15–41)
Albumin: 3.1 g/dL — ABNORMAL LOW (ref 3.5–5.0)
Alkaline Phosphatase: 102 U/L (ref 38–126)
Anion gap: 10 (ref 5–15)
BUN: 25 mg/dL — ABNORMAL HIGH (ref 6–20)
CO2: 25 mmol/L (ref 22–32)
Calcium: 9 mg/dL (ref 8.9–10.3)
Chloride: 95 mmol/L — ABNORMAL LOW (ref 98–111)
Creatinine, Ser: 1.74 mg/dL — ABNORMAL HIGH (ref 0.44–1.00)
GFR, Estimated: 36 mL/min — ABNORMAL LOW (ref >60)
Glucose, Bld: 240 mg/dL — ABNORMAL HIGH (ref 70–99)
Potassium: 4.2 mmol/L (ref 3.5–5.1)
Sodium: 130 mmol/L — ABNORMAL LOW (ref 135–145)
Total Bilirubin: 0.8 mg/dL (ref 0.3–1.2)
Total Protein: 7 g/dL (ref 6.5–8.1)

## 2021-02-08 LAB — CBC WITH DIFFERENTIAL/PLATELET
Abs Immature Granulocytes: 0.09 10*3/uL — ABNORMAL HIGH (ref 0.00–0.07)
Basophils Absolute: 0.1 10*3/uL (ref 0.0–0.1)
Basophils Relative: 0 %
Eosinophils Absolute: 0 10*3/uL (ref 0.0–0.5)
Eosinophils Relative: 0 %
HCT: 39.4 % (ref 36.0–46.0)
Hemoglobin: 12.7 g/dL (ref 12.0–15.0)
Immature Granulocytes: 1 %
Lymphocytes Relative: 16 %
Lymphs Abs: 2.7 10*3/uL (ref 0.7–4.0)
MCH: 27.5 pg (ref 26.0–34.0)
MCHC: 32.2 g/dL (ref 30.0–36.0)
MCV: 85.3 fL (ref 80.0–100.0)
Monocytes Absolute: 1.2 10*3/uL — ABNORMAL HIGH (ref 0.1–1.0)
Monocytes Relative: 7 %
Neutro Abs: 12.7 10*3/uL — ABNORMAL HIGH (ref 1.7–7.7)
Neutrophils Relative %: 76 %
Platelets: 247 10*3/uL (ref 150–400)
RBC: 4.62 MIL/uL (ref 3.87–5.11)
RDW: 13.2 % (ref 11.5–15.5)
WBC: 16.7 10*3/uL — ABNORMAL HIGH (ref 4.0–10.5)
nRBC: 0 % (ref 0.0–0.2)

## 2021-02-08 LAB — LACTIC ACID, PLASMA: Lactic Acid, Venous: 1.4 mmol/L (ref 0.5–1.9)

## 2021-02-08 MED ORDER — LACTATED RINGERS IV BOLUS
1000.0000 mL | Freq: Once | INTRAVENOUS | Status: AC
  Administered 2021-02-08: 1000 mL via INTRAVENOUS

## 2021-02-08 MED ORDER — ACETAMINOPHEN 325 MG PO TABS
650.0000 mg | ORAL_TABLET | Freq: Once | ORAL | Status: AC
  Administered 2021-02-08: 650 mg via ORAL
  Filled 2021-02-08: qty 2

## 2021-02-08 MED ORDER — FENTANYL CITRATE (PF) 100 MCG/2ML IJ SOLN
25.0000 ug | Freq: Once | INTRAMUSCULAR | Status: AC
  Administered 2021-02-08: 25 ug via INTRAVENOUS
  Filled 2021-02-08: qty 2

## 2021-02-08 NOTE — ED Provider Notes (Signed)
[facility] EMERGENCY DEPARTMENT Provider Note   CSN: [id number] Arrival date & time: 02/08/21  2021     History Chief Complaint  Patient presents with  . UTI symptoms    Karla Hoover is a 50 y.o. female.  HPI 50 year old female with history of COPD, diabetes, stroke, hypertension, and PE presents emergency department for lower abdominal pain and bilateral flank pain.  This has been present since yesterday, constant and worsening.  Nothing makes it better, nothing makes it worse.  Took Tylenol and received IM Rocephin today without improvement.  States is similar to her previous urinary tract infection.  Denies nausea or vomiting.  No chest pain or shortness of breath.  Does endorse dysuria frequency increase.  Denies bowel changes.    Past Medical History:  Diagnosis Date  . Asthma   . COPD (chronic obstructive pulmonary disease) ([place])   . Diabetes mellitus without complication ([place])   . Hypertension   . Pulmonary embolism ([place]) 11/16/2019  . Stroke [facility])     Patient Active Problem List   Diagnosis Date Noted  . Sepsis secondary to UTI ([place]) 02/08/2021  . Lymphadenopathy 12/28/2020  . VTE (venous thromboembolism) 12/01/2019  . Overweight (BMI 25.0-29.9) 11/22/2019  . Chronic constipation 11/22/2019  . Tobacco abuse 11/16/2019  . Elevated troponin 11/16/2019  . Bilateral pulmonary embolism ([place]) 11/15/2019  . DM (diabetes mellitus), type 2 ([place]) 11/15/2019  . History of stroke 11/15/2019  . AKI (acute kidney injury) ([place]) 11/15/2019  . Essential hypertension 05/04/2009  . COPD with chronic bronchitis ([place]) 05/04/2009    No past surgical history on file.   OB History   No obstetric history on file.     Family History  Problem Relation Age of Onset  . Diabetes Mother   . COPD Sister   . Heart failure Sister     Social History   Tobacco Use  . Smoking status: Former Smoker    Packs/day: 1.75    Years: 36.00    Pack years: 63.00     Types: Cigarettes    Start date: 10/08/1983    Quit date: 11/22/2019    Years since quitting: 1.2  . Smokeless tobacco: Never Used  Vaping Use  . Vaping Use: Every day  . Substances: Nicotine  Substance Use Topics  . Alcohol use: No  . Drug use: No    Home Medications Prior to Admission medications   Medication Sig Start Date End Date Taking? Authorizing Provider  albuterol (VENTOLIN HFA) 108 (90 Base) MCG/ACT inhaler Inhale 2 puffs into the lungs every 6 (six) hours as needed for wheezing or shortness of breath. Patient taking differently: Inhale 2 puffs into the lungs every 6 (six) hours as needed for wheezing or shortness of breath ("COPD"). 10/04/19  Yes Kerin Perna, NP  apixaban (ELIQUIS) 5 MG TABS tablet Take 2 tablets ($RemoveBe'10mg'cLAbtFcHC$ ) twice daily for 7 days, then 1 tablet ($RemoveB'5mg'peuAcUfD$ ) twice daily Patient taking differently: Take 5 mg by mouth every 12 (twelve) hours. 11/21/19  Yes Kayleen Memos, DO  aspirin 81 MG chewable tablet Chew 81 mg by mouth in the morning.   Yes [provider]  atorvastatin (LIPITOR) 80 MG tablet Take 1 tablet (80 mg total) by mouth daily. Patient taking differently: Take 80 mg by mouth every evening. 10/04/19  Yes Edwards, Michelle P, NP  COZAAR 50 MG tablet Take 50 mg by mouth daily.   Yes [provider]  cyclobenzaprine (FLEXERIL) 10 MG tablet Take  1 tablet (10 mg total) by mouth 3 (three) times daily as needed for muscle spasms. 10/04/19  Yes Kerin Perna, NP  gabapentin (NEURONTIN) 300 MG capsule Take 1 capsule (300 mg total) by mouth 3 (three) times daily. 11/11/19 12/11/19 Yes Kerin Perna, NP  glimepiride (AMARYL) 2 MG tablet Take 2 mg by mouth daily with breakfast.   Yes [provider]  metoprolol tartrate (LOPRESSOR) 25 MG tablet Take 1 tablet (25 mg total) by mouth 2 (two) times daily. 10/04/19  Yes Kerin Perna, NP  mirtazapine (REMERON) 7.5 MG tablet Take 1 tablet (7.5 mg total) by mouth at bedtime. 11/21/19  12/21/19 Yes Hall, Carole N, DO  SYMBICORT 80-4.5 MCG/ACT inhaler Inhale 2 puffs into the lungs 2 (two) times daily.   Yes [provider]  SYNTHROID 50 MCG tablet Take 50 mcg by mouth daily before breakfast.   Yes [provider]  ALPRAZolam (XANAX) 0.5 MG tablet Take 1 tablet (0.5 mg total) by mouth daily as needed for anxiety. Patient not taking: Reported on 02/08/2021 11/22/19   Annita Brod, MD  buPROPion [facility] SR) 100 MG 12 hr tablet Take 1 tablet (100 mg total) by mouth 2 (two) times daily. Patient not taking: Reported on 02/08/2021 10/04/19   Kerin Perna, NP  clopidogrel (PLAVIX) 75 MG tablet Take 1 tablet (75 mg total) by mouth daily. Patient not taking: Reported on 02/08/2021 10/04/19   Kerin Perna, NP  docusate sodium (COLACE) 100 MG capsule Take 100 mg by mouth 2 (two) times daily as needed for constipation. 08/23/19   [provider]  Fluticasone-Umeclidin-Vilant (TRELEGY ELLIPTA) 100-62.5-25 MCG/INH AEPB Inhale 1 puff into the lungs daily. Patient not taking: Reported on 02/08/2021 10/04/19   Kerin Perna, NP  ibuprofen (ADVIL) 800 MG tablet Take 1 tablet (800 mg total) by mouth every 8 (eight) hours as needed. Patient not taking: Reported on 02/08/2021 10/04/19   Kerin Perna, NP  Insulin Glargine (BASAGLAR KWIKPEN) 100 UNIT/ML SOPN Inject 0.15 mLs (15 Units total) into the skin daily. Patient not taking: No sig reported 10/04/19   Kerin Perna, NP  insulin lispro (HUMALOG KWIKPEN) 100 UNIT/ML KwikPen Inject 0.01-0.05 mLs (1-5 Units total) into the skin 3 (three) times daily as needed. PRN BLOOD SUGAR 151-200 1 unit 201-250 2 units 251-300 3 units 301-350 4 units 351-399 5 units Over 400 call MD Patient not taking: No sig reported 10/04/19   Charlott Rakes, MD  Insulin Pen Needle (PEN NEEDLES) 32G X 4 MM MISC 1 each by Does not apply route as needed. 10/04/19   Kerin Perna, NP  ipratropium-albuterol (DUONEB)  0.5-2.5 (3) MG/3ML SOLN Inhale 3 mLs into the lungs 4 (four) times daily as needed. Patient not taking: Reported on 02/08/2021 10/04/19   Kerin Perna, NP  levothyroxine (SYNTHROID) 50 MCG tablet Take 1 tablet (50 mcg total) by mouth daily. Patient not taking: Reported on 02/08/2021 10/07/19   Kerin Perna, NP  lidocaine (LIDODERM) 5 % Place 1 patch onto the skin every 12 (twelve) hours as needed. Patient not taking: Reported on 02/08/2021 09/09/19   Jacqlyn Larsen, PA-C  methylphenidate (RITALIN) 5 MG tablet Take 1 tablet (5 mg total) by mouth daily for 10 days. Patient not taking: Reported on 02/08/2021 11/21/19 12/01/19  Kayleen Memos, DO  naloxone [facility]) 4 MG/0.1ML LIQD nasal spray kit Place 1 spray into the nose once as needed. overdose Patient not taking: No sig reported  06/03/19   [provider]  nicotine (NICODERM CQ - DOSED IN MG/24 HOURS) 21 mg/24hr patch Place 21 mg onto the skin as needed. Smoking sensation Patient not taking: Reported on 02/08/2021 03/12/18   [provider]  nicotine (NICODERM CQ) 21 mg/24hr patch Place 1 patch (21 mg total) onto the skin daily. Patient not taking: Reported on 02/08/2021 12/01/19   Lauraine Rinne, NP  omeprazole (PRILOSEC) 20 MG capsule Take 20 mg by mouth daily. Patient not taking: Reported on 02/08/2021    [provider]  oxyCODONE (OXY IR/ROXICODONE) 5 MG immediate release tablet Take 1 tablet (5 mg total) by mouth every 4 (four) hours as needed for severe pain or breakthrough pain. Patient not taking: Reported on 02/08/2021 11/22/19   Annita Brod, MD  polyethylene glycol (MIRALAX / GLYCOLAX) 17 g packet Take 17 g by mouth 2 (two) times daily as needed. Patient not taking: Reported on 02/08/2021 09/09/19   Jacqlyn Larsen, PA-C  tetrahydrozoline 0.05 % ophthalmic solution Place 1 drop into both eyes 4 (four) times daily as needed. Eye irritation Patient not taking: Reported on 02/08/2021 07/23/19   [provider]     Allergies    Guaifenesin, Kiwi extract, Levaquin [levofloxacin in d5w], Strawberry extract, and Baclofen  Review of Systems   Review of Systems  Constitutional: Positive for chills and fever.  HENT: Negative for ear pain and sore throat.   Eyes: Negative for pain and visual disturbance.  Respiratory: Negative for cough and shortness of breath.   Cardiovascular: Negative for chest pain and palpitations.  Gastrointestinal: Positive for abdominal pain. Negative for vomiting.  Genitourinary: Positive for dysuria, flank pain and frequency. Negative for hematuria.  Musculoskeletal: Negative for arthralgias and back pain.  Skin: Negative for color change and rash.  Neurological: Negative for seizures and syncope.  Psychiatric/Behavioral: Negative for confusion.  All other systems reviewed and are negative.   Physical Exam Updated Vital Signs BP (!) 102/59   Pulse 97   Temp 99.1 F (37.3 C) (Oral)   Resp (!) 24   Ht $R'5\' 9"'EQ$  (1.753 m)   Wt 89.4 kg   LMP 10/20/2019   SpO2 97%   BMI 29.09 kg/m   Physical Exam Vitals and nursing note reviewed.  Constitutional:      General: She is not in acute distress.    Appearance: She is well-developed.  HENT:     Head: Normocephalic and atraumatic.     Right Ear: External ear normal.     Left Ear: External ear normal.     Nose: Nose normal.     Mouth/Throat:     Mouth: Mucous membranes are dry.  Eyes:     Conjunctiva/sclera: Conjunctivae normal.  Cardiovascular:     Rate and Rhythm: Regular rhythm. Tachycardia present.     Heart sounds: No murmur heard.   Pulmonary:     Effort: Pulmonary effort is normal. No respiratory distress.     Breath sounds: Normal breath sounds.  Abdominal:     Palpations: Abdomen is soft.     Tenderness: There is abdominal tenderness (suprapubic). There is right CVA tenderness and left CVA tenderness. There is no guarding.  Musculoskeletal:     Cervical back: Neck supple.     Right lower leg: No  edema.     Left lower leg: No edema.  Skin:    General: Skin is warm and dry.     Capillary Refill: Capillary refill takes less than 2  seconds.  Neurological:     General: No focal deficit present.     Mental Status: She is alert and oriented to person, place, and time.  Psychiatric:        Mood and Affect: Mood normal.        Behavior: Behavior normal.     ED Results / Procedures / Treatments   Labs (all labs ordered are listed, but only abnormal results are displayed) Labs Reviewed  URINALYSIS, ROUTINE W REFLEX MICROSCOPIC - Abnormal; Notable for the following components:      Result Value   Color, Urine AMBER (*)    APPearance CLOUDY (*)    Hgb urine dipstick LARGE (*)    Protein, ur 100 (*)    Leukocytes,Ua LARGE (*)    RBC / HPF >50 (*)    WBC, UA >50 (*)    Bacteria, UA RARE (*)    Non Squamous Epithelial 0-5 (*)    All other components within normal limits  CBC WITH DIFFERENTIAL/PLATELET - Abnormal; Notable for the following components:   WBC 16.7 (*)    Neutro Abs 12.7 (*)    Monocytes Absolute 1.2 (*)    Abs Immature Granulocytes 0.09 (*)    All other components within normal limits  COMPREHENSIVE METABOLIC PANEL - Abnormal; Notable for the following components:   Sodium 130 (*)    Chloride 95 (*)    Glucose, Bld 240 (*)    BUN 25 (*)    Creatinine, Ser 1.74 (*)    Albumin 3.1 (*)    GFR, Estimated 36 (*)    All other components within normal limits  URINE CULTURE  CULTURE, BLOOD (ROUTINE X 2)  CULTURE, BLOOD (ROUTINE X 2)  SARS CORONAVIRUS 2 (TAT 6-24 HRS)  LACTIC ACID, PLASMA    EKG None  Radiology DG Chest Portable 1 View  Result Date: 02/08/2021 CLINICAL DATA:  Fever EXAM: PORTABLE CHEST 1 VIEW COMPARISON:  11/15/2019 FINDINGS: The heart size and mediastinal contours are within normal limits. Both lungs are clear. The visualized skeletal structures are unremarkable. IMPRESSION: No active disease. Electronically Signed   By: Donavan Foil M.D.    On: 02/08/2021 22:50    Procedures Procedures   Medications Ordered in ED Medications  lactated ringers bolus 1,000 mL (0 mLs Intravenous Stopped 02/08/21 2244)  fentaNYL (SUBLIMAZE) injection 25 mcg (25 mcg Intravenous Given 02/08/21 2107)  acetaminophen (TYLENOL) tablet 650 mg (650 mg Oral Given 02/08/21 2146)  lactated ringers bolus 1,000 mL (1,000 mLs Intravenous New Bag/Given 02/08/21 2244)    ED Course  I have reviewed the triage vital signs and the nursing notes.  Pertinent labs & imaging results that were available during my care of the patient were reviewed by me and considered in my medical decision making (see chart for details).    MDM Rules/Calculators/A&P                          50 year old female here with concerns for urinary tract infection and sepsis.  Upon arrival, blood pressure soft, but stable.  Patient is tachycardic to 120s and afebrile currently.  Airway is intact.  She has bilateral breath sounds.  My immediate concern is for sepsis.  She is tachycardic with a soft blood pressure.  Has already received IM antibiotics appropriate for likely urinary source.  Less likely pneumonia without cough or respiratory symptoms.  Less likely cellulitis, no obvious skin infection on exam.  Less likely  colitis or pelvic inflammatory disease.  Exam shows tender suprapubic area with bilateral CVA tenderness.  Will obtain urine studies, CBC, CMP, blood cultures, lactates.  We will give 1 L fluid bolus due to tachycardia and soft blood pressure.  Upon reevaluation, vital signs are improved.  Given another liter fluid bolus for 2 total liters.  This meets 30 mL/KG for sepsis.  Vital signs normalized after this.  Patient continues to [facility].  Did discuss antibiotics with pharmacist.  They reviewed culture data and do not recommend broadening at this time as past cultures have been sensitive to Rocephin.  Patient admitted to hospitalist service for further monitoring and  treatment of urosepsis.  Final Clinical Impression(s) / ED Diagnoses Final diagnoses:  Tachycardia    Rx / DC Orders ED Discharge Orders    None       Suzan Nailer, DO 02/08/21 2346    Tegeler, Gwenyth Allegra, MD 02/09/21 2110

## 2021-02-08 NOTE — ED Triage Notes (Signed)
Pt bib gems from [facility] for UTI symptoms that started last night. Pt c/o lower abdominal pain, bilateral flank pain, and urinary frequency. Pt received 150 ml NS and 1g IM rocephin at facility at approx 3pm. Temp was 103 at approx 5pm and tylenol given at facility. 22G in R forearm in place on arrival. Hx of CHF.   BP: 94/58  RR: 24  EtCO2: 31  Spo2: 96% RA  HR: 130

## 2021-02-08 NOTE — ED Notes (Signed)
Tegelar MD aware currently bp. RN hung 2L to pressure bag. Pt a/0 4. @ this time.

## 2021-02-08 NOTE — ED Notes (Signed)
Pt @ this time requesting purewick for urine, refusing cath @ this time.

## 2021-02-09 ENCOUNTER — Observation Stay (HOSPITAL_COMMUNITY): Payer: Medicaid Other

## 2021-02-09 ENCOUNTER — Encounter (HOSPITAL_COMMUNITY): Payer: Self-pay | Admitting: Internal Medicine

## 2021-02-09 DIAGNOSIS — E1122 Type 2 diabetes mellitus with diabetic chronic kidney disease: Secondary | ICD-10-CM

## 2021-02-09 DIAGNOSIS — E1165 Type 2 diabetes mellitus with hyperglycemia: Secondary | ICD-10-CM | POA: Diagnosis present

## 2021-02-09 DIAGNOSIS — E1169 Type 2 diabetes mellitus with other specified complication: Secondary | ICD-10-CM | POA: Diagnosis present

## 2021-02-09 DIAGNOSIS — J449 Chronic obstructive pulmonary disease, unspecified: Secondary | ICD-10-CM | POA: Diagnosis not present

## 2021-02-09 DIAGNOSIS — K219 Gastro-esophageal reflux disease without esophagitis: Secondary | ICD-10-CM | POA: Diagnosis present

## 2021-02-09 DIAGNOSIS — N179 Acute kidney failure, unspecified: Secondary | ICD-10-CM | POA: Diagnosis present

## 2021-02-09 DIAGNOSIS — A419 Sepsis, unspecified organism: Secondary | ICD-10-CM | POA: Diagnosis present

## 2021-02-09 DIAGNOSIS — E871 Hypo-osmolality and hyponatremia: Secondary | ICD-10-CM | POA: Diagnosis present

## 2021-02-09 DIAGNOSIS — Z794 Long term (current) use of insulin: Secondary | ICD-10-CM | POA: Diagnosis not present

## 2021-02-09 DIAGNOSIS — N12 Tubulo-interstitial nephritis, not specified as acute or chronic: Secondary | ICD-10-CM | POA: Diagnosis present

## 2021-02-09 DIAGNOSIS — Z888 Allergy status to other drugs, medicaments and biological substances status: Secondary | ICD-10-CM | POA: Diagnosis not present

## 2021-02-09 DIAGNOSIS — J9811 Atelectasis: Secondary | ICD-10-CM | POA: Diagnosis present

## 2021-02-09 DIAGNOSIS — E785 Hyperlipidemia, unspecified: Secondary | ICD-10-CM | POA: Diagnosis present

## 2021-02-09 DIAGNOSIS — E1140 Type 2 diabetes mellitus with diabetic neuropathy, unspecified: Secondary | ICD-10-CM | POA: Diagnosis present

## 2021-02-09 DIAGNOSIS — E039 Hypothyroidism, unspecified: Secondary | ICD-10-CM | POA: Diagnosis present

## 2021-02-09 DIAGNOSIS — I10 Essential (primary) hypertension: Secondary | ICD-10-CM

## 2021-02-09 DIAGNOSIS — Z79890 Hormone replacement therapy (postmenopausal): Secondary | ICD-10-CM | POA: Diagnosis not present

## 2021-02-09 DIAGNOSIS — J9611 Chronic respiratory failure with hypoxia: Secondary | ICD-10-CM | POA: Diagnosis present

## 2021-02-09 DIAGNOSIS — N1831 Chronic kidney disease, stage 3a: Secondary | ICD-10-CM | POA: Diagnosis present

## 2021-02-09 DIAGNOSIS — Z86718 Personal history of other venous thrombosis and embolism: Secondary | ICD-10-CM

## 2021-02-09 DIAGNOSIS — R000 Tachycardia, unspecified: Secondary | ICD-10-CM | POA: Diagnosis not present

## 2021-02-09 DIAGNOSIS — N390 Urinary tract infection, site not specified: Secondary | ICD-10-CM

## 2021-02-09 DIAGNOSIS — Z20822 Contact with and (suspected) exposure to covid-19: Secondary | ICD-10-CM | POA: Diagnosis present

## 2021-02-09 DIAGNOSIS — I69354 Hemiplegia and hemiparesis following cerebral infarction affecting left non-dominant side: Secondary | ICD-10-CM | POA: Diagnosis not present

## 2021-02-09 DIAGNOSIS — I129 Hypertensive chronic kidney disease with stage 1 through stage 4 chronic kidney disease, or unspecified chronic kidney disease: Secondary | ICD-10-CM | POA: Diagnosis present

## 2021-02-09 DIAGNOSIS — K5909 Other constipation: Secondary | ICD-10-CM | POA: Diagnosis present

## 2021-02-09 DIAGNOSIS — Z7401 Bed confinement status: Secondary | ICD-10-CM | POA: Diagnosis not present

## 2021-02-09 DIAGNOSIS — E782 Mixed hyperlipidemia: Secondary | ICD-10-CM

## 2021-02-09 HISTORY — DX: Type 2 diabetes mellitus with other specified complication: E11.69

## 2021-02-09 HISTORY — DX: Chronic kidney disease, stage 3a: N18.31

## 2021-02-09 HISTORY — DX: Type 2 diabetes mellitus with diabetic chronic kidney disease: E11.22

## 2021-02-09 HISTORY — DX: Chronic respiratory failure with hypoxia: J96.11

## 2021-02-09 HISTORY — DX: Gastro-esophageal reflux disease without esophagitis: K21.9

## 2021-02-09 HISTORY — DX: Long term (current) use of insulin: Z79.4

## 2021-02-09 HISTORY — DX: Hypothyroidism, unspecified: E03.9

## 2021-02-09 HISTORY — DX: Mixed hyperlipidemia: E78.2

## 2021-02-09 LAB — COMPREHENSIVE METABOLIC PANEL
ALT: 15 U/L (ref 0–44)
AST: 20 U/L (ref 15–41)
Albumin: 2.9 g/dL — ABNORMAL LOW (ref 3.5–5.0)
Alkaline Phosphatase: 103 U/L (ref 38–126)
Anion gap: 10 (ref 5–15)
BUN: 23 mg/dL — ABNORMAL HIGH (ref 6–20)
CO2: 27 mmol/L (ref 22–32)
Calcium: 9.1 mg/dL (ref 8.9–10.3)
Chloride: 95 mmol/L — ABNORMAL LOW (ref 98–111)
Creatinine, Ser: 1.63 mg/dL — ABNORMAL HIGH (ref 0.44–1.00)
GFR, Estimated: 39 mL/min — ABNORMAL LOW (ref >60)
Glucose, Bld: 204 mg/dL — ABNORMAL HIGH (ref 70–99)
Potassium: 4.1 mmol/L (ref 3.5–5.1)
Sodium: 132 mmol/L — ABNORMAL LOW (ref 135–145)
Total Bilirubin: 1 mg/dL (ref 0.3–1.2)
Total Protein: 7.3 g/dL (ref 6.5–8.1)

## 2021-02-09 LAB — CBC WITH DIFFERENTIAL/PLATELET
Abs Immature Granulocytes: 0.08 10*3/uL — ABNORMAL HIGH (ref 0.00–0.07)
Basophils Absolute: 0 10*3/uL (ref 0.0–0.1)
Basophils Relative: 0 %
Eosinophils Absolute: 0.1 10*3/uL (ref 0.0–0.5)
Eosinophils Relative: 1 %
HCT: 39.4 % (ref 36.0–46.0)
Hemoglobin: 12.7 g/dL (ref 12.0–15.0)
Immature Granulocytes: 1 %
Lymphocytes Relative: 17 %
Lymphs Abs: 2.3 10*3/uL (ref 0.7–4.0)
MCH: 27.4 pg (ref 26.0–34.0)
MCHC: 32.2 g/dL (ref 30.0–36.0)
MCV: 85.1 fL (ref 80.0–100.0)
Monocytes Absolute: 1 10*3/uL (ref 0.1–1.0)
Monocytes Relative: 7 %
Neutro Abs: 10.2 10*3/uL — ABNORMAL HIGH (ref 1.7–7.7)
Neutrophils Relative %: 74 %
Platelets: 198 10*3/uL (ref 150–400)
RBC: 4.63 MIL/uL (ref 3.87–5.11)
RDW: 13.2 % (ref 11.5–15.5)
WBC: 13.7 10*3/uL — ABNORMAL HIGH (ref 4.0–10.5)
nRBC: 0 % (ref 0.0–0.2)

## 2021-02-09 LAB — PROTIME-INR
INR: 1.7 — ABNORMAL HIGH (ref 0.8–1.2)
Prothrombin Time: 19.9 seconds — ABNORMAL HIGH (ref 11.4–15.2)

## 2021-02-09 LAB — PROCALCITONIN: Procalcitonin: 0.88 ng/mL

## 2021-02-09 LAB — GLUCOSE, CAPILLARY
Glucose-Capillary: 195 mg/dL — ABNORMAL HIGH (ref 70–99)
Glucose-Capillary: 189 mg/dL — ABNORMAL HIGH (ref 70–99)
Glucose-Capillary: 143 mg/dL — ABNORMAL HIGH (ref 70–99)
Glucose-Capillary: 175 mg/dL — ABNORMAL HIGH (ref 70–99)
Glucose-Capillary: 180 mg/dL — ABNORMAL HIGH (ref 70–99)

## 2021-02-09 LAB — HEMOGLOBIN A1C
Hgb A1c MFr Bld: 9.9 % — ABNORMAL HIGH (ref 4.8–5.6)
Mean Plasma Glucose: 237.43 mg/dL

## 2021-02-09 LAB — HIV ANTIBODY (ROUTINE TESTING W REFLEX): HIV Screen 4th Generation wRfx: NONREACTIVE

## 2021-02-09 LAB — CORTISOL-AM, BLOOD: Cortisol - AM: 17.1 ug/dL (ref 6.7–22.6)

## 2021-02-09 LAB — SARS CORONAVIRUS 2 (TAT 6-24 HRS): SARS Coronavirus 2: NEGATIVE

## 2021-02-09 LAB — MAGNESIUM: Magnesium: 1.7 mg/dL (ref 1.7–2.4)

## 2021-02-09 MED ORDER — ENOXAPARIN SODIUM 40 MG/0.4ML IJ SOSY: 40.0000 mg | PREFILLED_SYRINGE | INTRAMUSCULAR | Status: DC

## 2021-02-09 MED ORDER — SODIUM CHLORIDE 0.9 % IV SOLN: INTRAVENOUS | Status: AC

## 2021-02-09 MED ORDER — MELATONIN 5 MG PO TABS: 10.0000 mg | ORAL_TABLET | Freq: Every evening | ORAL | Status: DC | PRN

## 2021-02-09 MED ORDER — LEVOTHYROXINE SODIUM 50 MCG PO TABS
50.0000 ug | ORAL_TABLET | Freq: Every day | ORAL | Status: DC
  Administered 2021-02-09 – 2021-02-12 (×4): 50 ug via ORAL
  Filled 2021-02-09 (×4): qty 1

## 2021-02-09 MED ORDER — SODIUM CHLORIDE 0.9 % IV SOLN
1.0000 g | Freq: Every day | INTRAVENOUS | Status: DC
  Administered 2021-02-09 – 2021-02-11 (×4): 1 g via INTRAVENOUS
  Filled 2021-02-09 (×4): qty 10

## 2021-02-09 MED ORDER — INSULIN GLARGINE 100 UNIT/ML ~~LOC~~ SOLN
17.0000 [IU] | Freq: Every day | SUBCUTANEOUS | Status: DC
  Administered 2021-02-09: 17 [IU] via SUBCUTANEOUS
  Filled 2021-02-09 (×2): qty 0.17

## 2021-02-09 MED ORDER — ONDANSETRON HCL 4 MG PO TABS: 4.0000 mg | ORAL_TABLET | Freq: Four times a day (QID) | ORAL | Status: DC | PRN

## 2021-02-09 MED ORDER — CYCLOBENZAPRINE HCL 10 MG PO TABS
10.0000 mg | ORAL_TABLET | Freq: Three times a day (TID) | ORAL | Status: DC | PRN
  Administered 2021-02-09 (×2): 10 mg via ORAL
  Filled 2021-02-09 (×2): qty 1

## 2021-02-09 MED ORDER — METOPROLOL TARTRATE 25 MG PO TABS
25.0000 mg | ORAL_TABLET | Freq: Two times a day (BID) | ORAL | Status: DC
  Administered 2021-02-09 – 2021-02-12 (×7): 25 mg via ORAL
  Filled 2021-02-09 (×7): qty 1

## 2021-02-09 MED ORDER — ONDANSETRON HCL 4 MG/2ML IJ SOLN: 4.0000 mg | Freq: Four times a day (QID) | INTRAMUSCULAR | Status: DC | PRN

## 2021-02-09 MED ORDER — SODIUM CHLORIDE 0.9 % IV SOLN: INTRAVENOUS | Status: DC

## 2021-02-09 MED ORDER — INSULIN ASPART 100 UNIT/ML IJ SOLN
0.0000 [IU] | Freq: Three times a day (TID) | INTRAMUSCULAR | Status: DC
  Administered 2021-02-09 (×2): 3 [IU] via SUBCUTANEOUS
  Administered 2021-02-09: 2 [IU] via SUBCUTANEOUS
  Administered 2021-02-09: 3 [IU] via SUBCUTANEOUS
  Administered 2021-02-10 (×2): 5 [IU] via SUBCUTANEOUS
  Administered 2021-02-10: 3 [IU] via SUBCUTANEOUS
  Administered 2021-02-10 – 2021-02-11 (×2): 2 [IU] via SUBCUTANEOUS
  Administered 2021-02-11 (×3): 3 [IU] via SUBCUTANEOUS
  Administered 2021-02-12: 5 [IU] via SUBCUTANEOUS
  Administered 2021-02-12: 3 [IU] via SUBCUTANEOUS

## 2021-02-09 MED ORDER — ACETAMINOPHEN 650 MG RE SUPP: 650.0000 mg | Freq: Four times a day (QID) | RECTAL | Status: DC | PRN

## 2021-02-09 MED ORDER — ATORVASTATIN CALCIUM 80 MG PO TABS
80.0000 mg | ORAL_TABLET | Freq: Every evening | ORAL | Status: DC
  Administered 2021-02-09 – 2021-02-12 (×4): 80 mg via ORAL
  Filled 2021-02-09 (×4): qty 1

## 2021-02-09 MED ORDER — ALPRAZOLAM 0.5 MG PO TABS
0.5000 mg | ORAL_TABLET | Freq: Two times a day (BID) | ORAL | Status: DC | PRN
  Administered 2021-02-09: 0.5 mg via ORAL
  Filled 2021-02-09: qty 1

## 2021-02-09 MED ORDER — ACETAMINOPHEN 325 MG PO TABS
325.0000 mg | ORAL_TABLET | Freq: Once | ORAL | Status: AC
  Administered 2021-02-09: 325 mg via ORAL
  Filled 2021-02-09: qty 1

## 2021-02-09 MED ORDER — ALBUTEROL SULFATE HFA 108 (90 BASE) MCG/ACT IN AERS: 2.0000 | INHALATION_SPRAY | Freq: Four times a day (QID) | RESPIRATORY_TRACT | Status: DC | PRN

## 2021-02-09 MED ORDER — INSULIN GLARGINE 100 UNIT/ML ~~LOC~~ SOLN
10.0000 [IU] | Freq: Once | SUBCUTANEOUS | Status: AC
  Administered 2021-02-09: 10 [IU] via SUBCUTANEOUS
  Filled 2021-02-09: qty 0.1

## 2021-02-09 MED ORDER — ASPIRIN 81 MG PO CHEW
81.0000 mg | CHEWABLE_TABLET | Freq: Every morning | ORAL | Status: DC
  Administered 2021-02-09 – 2021-02-11 (×3): 81 mg via ORAL
  Filled 2021-02-09 (×3): qty 1

## 2021-02-09 MED ORDER — FLUTICASONE FUROATE-VILANTEROL 100-25 MCG/INH IN AEPB
1.0000 | INHALATION_SPRAY | Freq: Every day | RESPIRATORY_TRACT | Status: DC
  Administered 2021-02-09 – 2021-02-12 (×4): 1 via RESPIRATORY_TRACT
  Filled 2021-02-09: qty 28

## 2021-02-09 MED ORDER — POLYETHYLENE GLYCOL 3350 17 G PO PACK: 17.0000 g | PACK | Freq: Every day | ORAL | Status: DC | PRN

## 2021-02-09 MED ORDER — TRAMADOL HCL 50 MG PO TABS
50.0000 mg | ORAL_TABLET | Freq: Four times a day (QID) | ORAL | Status: DC | PRN
Start: 2021-02-09 — End: 2021-02-13
  Administered 2021-02-10 – 2021-02-12 (×6): 50 mg via ORAL
  Filled 2021-02-09 (×6): qty 1

## 2021-02-09 MED ORDER — APIXABAN 5 MG PO TABS
5.0000 mg | ORAL_TABLET | Freq: Two times a day (BID) | ORAL | Status: DC
  Administered 2021-02-09 – 2021-02-12 (×7): 5 mg via ORAL
  Filled 2021-02-09 (×7): qty 1

## 2021-02-09 MED ORDER — GABAPENTIN 300 MG PO CAPS
300.0000 mg | ORAL_CAPSULE | Freq: Three times a day (TID) | ORAL | Status: DC
  Administered 2021-02-09 – 2021-02-12 (×11): 300 mg via ORAL
  Filled 2021-02-09 (×11): qty 1

## 2021-02-09 MED ORDER — ACETAMINOPHEN 325 MG PO TABS
650.0000 mg | ORAL_TABLET | Freq: Four times a day (QID) | ORAL | Status: DC | PRN
  Administered 2021-02-09 (×3): 650 mg via ORAL
  Filled 2021-02-09 (×3): qty 2

## 2021-02-09 NOTE — ED Notes (Signed)
Pt readjusted in bed. Currently resting w/ bed low and locked. Side rails up 2. Call bell in reach. BP WNL.

## 2021-02-09 NOTE — H&P (Signed)
History and Physical    Karla Hoover BZJ:[id number] DOB: 1970-12-25 DOA: 02/08/2021  PCP: Patient, [name]  Patient coming from: [facility]   Chief Complaint:  Chief Complaint  Patient presents with  . UTI symptoms     HPI:    50 year old female with past medical history of COPD, chronic respiratory failure (2lpm via [place] with exertion and sleep), chronic kidney disease stage IIIa, bilateral pulmonary emboli as well as a left lower extremity DVT (11/2019 on Eliquis), right RCA stroke with left-sided weakness, nicotine dependence, hypothyroidism,  insulin-dependent diabetes mellitus type 2 gastroesophageal reflux disease who presents to [facility] emergency department due to fevers and generalized body aches from [facility].  Patient explains that for approximately the past 2 days the patient has been experiencing rapidly progressive generalized weakness.  This weakness is severe in intensity and associated with severe body aches and poor appetite.  Patient complains of associated abdominal pain, sore in quality and periumbilical in location as well as low back pain and bilateral thigh pain.  As patient's symptoms have progressively worsened patient is also experiencing intermittent fevers.  Patient's body aches weakness poor appetite and intermittent fevers continue to persist until patient's skilled nurse facility sent patient to [facility] emergency department for evaluation on 5/5.  Upon evaluation in the emergency department patient's been found to exhibit multiple SIRS criteria including fever tachycardia and substantial leukocytosis with urinalysis highly suggestive of urinary tract infection and evidence of superimposed acute kidney injury.  All of this has been suggestive of early sepsis.  Patient has received at least 30 cc/kg of isotonic fluids.  Blood cultures have been  obtained as well as urine cultures.  The hospitalist group was then called to assess the patient for admission to the hospital.   Review of Systems:   Review of Systems  Constitutional: Positive for fever and malaise/fatigue.  Gastrointestinal: Positive for abdominal pain.  Musculoskeletal: Positive for back pain.  Neurological: Positive for weakness.  All other systems reviewed and are negative.   Past Medical History:  Diagnosis Date  . Asthma   . Chronic kidney disease, stage 3a ([place]) 02/09/2021  . Chronic respiratory failure with hypoxia ([place]) 02/09/2021  . COPD (chronic obstructive pulmonary disease) ([place])   . COPD with chronic bronchitis ([place]) 05/04/2009   Former smoker 36 pack year smoking history    . Diabetes mellitus without complication ([place])   . Essential hypertension 05/04/2009   Qualifier: Diagnosis of  By: Quentin Cornwall CMA, Janett Billow    . GERD without esophagitis 02/09/2021  . Hypertension   . Hypothyroidism 02/09/2021  . Mixed 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 ([place]) 02/09/2021  . Pulmonary embolism ([place]) 11/16/2019  . Stroke ([place])   . Type 2 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 disease, with long-term current use of insulin ([place]) 02/09/2021    History reviewed. No pertinent surgical history.   reports that she quit smoking about 14 months ago. Her smoking use included cigarettes. She started smoking about 37 years ago. She has a 63.00 pack-year smoking history. She has never used smokeless tobacco. She reports that she does not drink alcohol and does not use drugs.  Allergies  Allergen Reactions  . Guaifenesin Anaphylaxis  . Kiwi Extract Anaphylaxis and Rash  . Levaquin [Levofloxacin In D5w] Shortness Of Breath and Rash  . Strawberry Extract Anaphylaxis and Rash  . Baclofen Other (See Comments)  Near syncope/ fall    Family History  Problem Relation Age of Onset  . Diabetes Mother   . COPD Sister   . Heart failure Sister       Prior to Admission medications   Medication Sig Start Date End Date Taking? Authorizing Provider  albuterol (VENTOLIN HFA) 108 (90 Base) MCG/ACT inhaler Inhale 2 puffs into the lungs every 6 (six) hours as needed for wheezing or shortness of breath. Patient taking differently: Inhale 2 puffs into the lungs every 6 (six) hours as needed for wheezing or shortness of breath ("COPD"). 10/04/19  Yes Kerin Perna, NP  apixaban (ELIQUIS) 5 MG TABS tablet Take 2 tablets (66m) twice daily for 7 days, then 1 tablet (551m twice daily Patient taking differently: Take 5 mg by mouth every 12 (twelve) hours. 11/21/19  Yes HaKayleen MemosDO  aspirin 81 MG chewable tablet Chew 81 mg by mouth in the morning.   Yes [provider]  atorvastatin (LIPITOR) 80 MG tablet Take 1 tablet (80 mg total) by mouth daily. Patient taking differently: Take 80 mg by mouth every evening. 10/04/19  Yes Edwards, Michelle P, NP  COZAAR 50 MG tablet Take 50 mg by mouth daily.   Yes [provider]  cyclobenzaprine (FLEXERIL) 10 MG tablet Take 1 tablet (10 mg total) by mouth 3 (three) times daily as needed for muscle spasms. 10/04/19  Yes EdKerin PernaNP  gabapentin (NEURONTIN) 300 MG capsule Take 1 capsule (300 mg total) by mouth 3 (three) times daily. 11/11/19 12/11/19 Yes EdKerin PernaNP  glimepiride (AMARYL) 2 MG tablet Take 2 mg by mouth daily with breakfast.   Yes [provider]  metoprolol tartrate (LOPRESSOR) 25 MG tablet Take 1 tablet (25 mg total) by mouth 2 (two) times daily. 10/04/19  Yes EdKerin PernaNP  mirtazapine (REMERON) 7.5 MG tablet Take 1 tablet (7.5 mg total) by mouth at bedtime. 11/21/19 12/21/19 Yes Hall, Carole N, DO  SYMBICORT 80-4.5 MCG/ACT inhaler Inhale 2 puffs into the lungs 2 (two) times daily.   Yes [provider]  SYNTHROID 50 MCG tablet Take 50 mcg by mouth daily before breakfast.   Yes [provider]  ALPRAZolam (XANAX) 0.5  MG tablet Take 1 tablet (0.5 mg total) by mouth daily as needed for anxiety. Patient not taking: Reported on 02/08/2021 11/22/19   KrAnnita BrodMD  buPROPion ([facility]) 100 MG 12 hr tablet Take 1 tablet (100 mg total) by mouth 2 (two) times daily. Patient not taking: Reported on 02/08/2021 10/04/19   EdKerin PernaNP  clopidogrel (PLAVIX) 75 MG tablet Take 1 tablet (75 mg total) by mouth daily. Patient not taking: Reported on 02/08/2021 10/04/19   EdKerin PernaNP  docusate sodium (COLACE) 100 MG capsule Take 100 mg by mouth 2 (two) times daily as needed for constipation. 08/23/19   [provider]  Fluticasone-Umeclidin-Vilant (TRELEGY ELLIPTA) 100-62.5-25 MCG/INH AEPB Inhale 1 puff into the lungs daily. Patient not taking: Reported on 02/08/2021 10/04/19   EdKerin PernaNP  ibuprofen (ADVIL) 800 MG tablet Take 1 tablet (800 mg total) by mouth every 8 (eight) hours as needed. Patient not taking: Reported on 02/08/2021 10/04/19   EdKerin PernaNP  Insulin Glargine (BASAGLAR KWIKPEN) 100 UNIT/ML SOPN Inject 0.15 mLs (15 Units total) into the skin daily. Patient not taking: No sig reported 10/04/19   EdKerin PernaNP  insulin lispro (HUMALOG KWIKPEN) 100 UNIT/ML KwikPen  Inject 0.01-0.05 mLs (1-5 Units total) into the skin 3 (three) times daily as needed. PRN BLOOD SUGAR 151-200 1 unit 201-250 2 units 251-300 3 units 301-350 4 units 351-399 5 units Over 400 call MD Patient not taking: No sig reported 10/04/19   Charlott Rakes, MD  Insulin Pen Needle (PEN NEEDLES) 32G X 4 MM MISC 1 each by Does not apply route as needed. 10/04/19   Kerin Perna, NP  ipratropium-albuterol (DUONEB) 0.5-2.5 (3) MG/3ML SOLN Inhale 3 mLs into the lungs 4 (four) times daily as needed. Patient not taking: Reported on 02/08/2021 10/04/19   Kerin Perna, NP  levothyroxine (SYNTHROID) 50 MCG tablet Take 1 tablet (50 mcg total) by mouth daily. Patient not taking: Reported  on 02/08/2021 10/07/19   Kerin Perna, NP  lidocaine (LIDODERM) 5 % Place 1 patch onto the skin every 12 (twelve) hours as needed. Patient not taking: Reported on 02/08/2021 09/09/19   Jacqlyn Larsen, PA-C  methylphenidate (RITALIN) 5 MG tablet Take 1 tablet (5 mg total) by mouth daily for 10 days. Patient not taking: Reported on 02/08/2021 11/21/19 12/01/19  Kayleen Memos, DO  naloxone [facility]) 4 MG/0.1ML LIQD nasal spray kit Place 1 spray into the nose once as needed. overdose Patient not taking: No sig reported 06/03/19   [provider]  nicotine (NICODERM CQ - DOSED IN MG/24 HOURS) 21 mg/24hr patch Place 21 mg onto the skin as needed. Smoking sensation Patient not taking: Reported on 02/08/2021 03/12/18   [provider]  nicotine (NICODERM CQ) 21 mg/24hr patch Place 1 patch (21 mg total) onto the skin daily. Patient not taking: Reported on 02/08/2021 12/01/19   Lauraine Rinne, NP  omeprazole (PRILOSEC) 20 MG capsule Take 20 mg by mouth daily. Patient not taking: Reported on 02/08/2021    [provider]  oxyCODONE (OXY IR/ROXICODONE) 5 MG immediate release tablet Take 1 tablet (5 mg total) by mouth every 4 (four) hours as needed for severe pain or breakthrough pain. Patient not taking: Reported on 02/08/2021 11/22/19   Annita Brod, MD  polyethylene glycol (MIRALAX / GLYCOLAX) 17 g packet Take 17 g by mouth 2 (two) times daily as needed. Patient not taking: Reported on 02/08/2021 09/09/19   Jacqlyn Larsen, PA-C  tetrahydrozoline 0.05 % ophthalmic solution Place 1 drop into both eyes 4 (four) times daily as needed. Eye irritation Patient not taking: Reported on 02/08/2021 07/23/19   [provider]    Physical Exam: Vitals:   02/09/21 0003 02/09/21 0015 02/09/21 0030 02/09/21 0045  BP: (!) 92/55 (!) 95/57 101/61 112/68  Pulse: 96 88 88 93  Resp:      Temp: 98.1 F (36.7 C)  98.1 F (36.7 C) 98.1 F (36.7 C)  TempSrc: Oral  Oral Oral  SpO2: 97% 97% 97% 98%   Weight:      Height:        Constitutional: Lethargic but arousable, oriented x3, no associated distress.   Skin: no rashes, no lesions, good skin turgor noted. Eyes: Pupils are equally reactive to light.  No evidence of scleral icterus or conjunctival pallor.  ENMT: Dry mucous membranes noted.  Posterior pharynx clear of any exudate or lesions.   Neck: normal, supple, no masses, no thyromegaly.  No evidence of jugular venous distension.   Respiratory: clear to auscultation bilaterally, no wheezing, no crackles. Normal respiratory effort. No accessory muscle use.  Cardiovascular: Tachycardic rate with regular rhythm, no murmurs / rubs /  gallops. No extremity edema. 2+ pedal pulses. No carotid bruits.  Chest:   Nontender without crepitus or deformity.   Back:   Nontender without crepitus or deformity. Abdomen: Notable generalized tenderness.  Abdomen is soft however.  No evidence of intra-abdominal masses.  Positive bowel sounds noted in all quadrants.   Musculoskeletal: Notable contractures of the left distal upper extremity.  No joint deformity upper and lower extremities. Good ROM, no contractures. Normal muscle tone.  Neurologic: Notable weakness of the left upper and left lower extremity with proximal distal muscle groups, approximately 3 out of 6 in strength.  CN 2-12 grossly intact. Sensation intact. Patient is following all commands.  Patient is responsive to verbal stimuli.   Psychiatric: Patient exhibits depressed mood with flat affect.  Patient seems to possess insight as to their current situation.     Labs on Admission: I have personally reviewed following labs and imaging studies -   CBC: Recent Labs  Lab 02/08/21 2042  WBC 16.7*  NEUTROABS 12.7*  HGB 12.7  HCT 39.4  MCV 85.3  PLT 703   Basic Metabolic Panel: Recent Labs  Lab 02/08/21 2042  NA 130*  K 4.2  CL 95*  CO2 25  GLUCOSE 240*  BUN 25*  CREATININE 1.74*  CALCIUM 9.0   GFR: Estimated Creatinine  Clearance: 47.1 mL/min (A) (by C-G formula based on SCr of 1.74 mg/dL (H)). Liver Function Tests: Recent Labs  Lab 02/08/21 2042  AST 22  ALT 17  ALKPHOS 102  BILITOT 0.8  PROT 7.0  ALBUMIN 3.1*   No results for input(s): LIPASE, AMYLASE in the last 168 hours. No results for input(s): AMMONIA in the last 168 hours. Coagulation Profile: No results for input(s): INR, PROTIME in the last 168 hours. Cardiac Enzymes: No results for input(s): CKTOTAL, CKMB, CKMBINDEX, TROPONINI in the last 168 hours. BNP (last 3 results) No results for input(s): PROBNP in the last 8760 hours. HbA1C: No results for input(s): HGBA1C in the last 72 hours. CBG: No results for input(s): GLUCAP in the last 168 hours. Lipid Profile: No results for input(s): CHOL, HDL, LDLCALC, TRIG, CHOLHDL, LDLDIRECT in the last 72 hours. Thyroid Function Tests: No results for input(s): TSH, T4TOTAL, FREET4, T3FREE, THYROIDAB in the last 72 hours. Anemia Panel: No results for input(s): VITAMINB12, FOLATE, FERRITIN, TIBC, IRON, RETICCTPCT in the last 72 hours. Urine analysis:    Component Value Date/Time   COLORURINE AMBER (A) 02/08/2021 2042   APPEARANCEUR CLOUDY (A) 02/08/2021 2042   APPEARANCEUR Clear 11/24/2012 1238   LABSPEC 1.019 02/08/2021 2042   LABSPEC 1.000 11/24/2012 1238   PHURINE 5.0 02/08/2021 2042   GLUCOSEU NEGATIVE 02/08/2021 2042   GLUCOSEU >=500 11/24/2012 1238   HGBUR LARGE (A) 02/08/2021 2042   BILIRUBINUR NEGATIVE 02/08/2021 2042   BILIRUBINUR Negative 11/24/2012 [place] 02/08/2021 2042   PROTEINUR 100 (A) 02/08/2021 2042   UROBILINOGEN 1.0 04/30/2010 0902   NITRITE NEGATIVE 02/08/2021 2042   LEUKOCYTESUR LARGE (A) 02/08/2021 2042   LEUKOCYTESUR Negative 11/24/2012 1238    Radiological Exams on Admission - Personally Reviewed: DG Chest Portable 1 View  Result Date: 02/08/2021 CLINICAL DATA:  Fever EXAM: PORTABLE CHEST 1 VIEW COMPARISON:  11/15/2019 FINDINGS: The heart  size and mediastinal contours are within normal limits. Both lungs are clear. The visualized skeletal structures are unremarkable. IMPRESSION: No active disease. Electronically Signed   By: Donavan Foil M.D.   On: 02/08/2021 22:50    Telemetry: Personally reviewed.  Rhythm  is sinus tachycardia 105 bpm.  Assessment/Plan Principal Problem:   Sepsis secondary to UTI likely caused by E. coli.   Patient exhibiting multiple SIRS criteria including tachycardia, fever and leukocytosis in the setting of urinary tract infection with associated acute kidney injury all suggestive of sepsis.  Treating patient with intravenous antibiotic therapy with intravenous ceftriaxone   chart review reveals that patient last had an E. coli urinary tract infection in late 2021 and this is likely the recurrent culprit.  Hydrating patient aggressively with intravenous isotonic fluids.  Blood and urine cultures have been obtained  Antibiotics will be de-escalated based on culture results  Patient has been placed in the progressive unit for close clinical monitoring.  Active Problems:   Acute renal failure superimposed on stage 3a chronic kidney disease (HCC)   Patient exhibiting evidence of acute kidney injury with creatinine of 1.74, up from baseline of 1.06  Likely secondary to sepsis and volume depletion  Hydrating patient with intravenous fluids  Strict input and output monitoring  Monitoring renal function and electrolytes with serial chemistries  Minimizing nephrotoxic agents    COPD with chronic bronchitis (HCC)   No evidence of COPD exacerbation at this time  As needed bronchodilator therapy for shortness of breath and wheezing  Continue home regimen of maintenance inhaler therapy.    Chronic respiratory failure with hypoxia (HCC)   Continue home regimen of 2 L of oxygen via nasal cannula for exertion and rest    History of thromboembolism   Bilateral pulmonary emboli with left  femoral DVT in February 2021  Continuing home regimen of Eliquis therapy    Type 2 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 disease, with long-term current use of insulin (HCC)   Accu-Cheks before every meal and nightly with sliding scale insulin  Continuing home regimen of Lantus 17 units nightly  Holding home regimen of oral hypoglycemics  Hemoglobin A1c pending    Mixed 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 ([place])  . Continuing home regimen of lipid lowering therapy.    Hypothyroidism  . Resume home regimen of Synthroid    GERD without esophagitis  . Continuing home regimen of daily PPI therapy.  History of stroke with left-sided hemiparesis   Patient is neurologically at baseline  Fall precautions  PT evaluation ordered due to increasing weakness secondary to sepsis.   Code Status:  Full code Family Communication: deferred   Status is: Observation  The patient remains OBS appropriate and will d/c before 2 midnights.  Dispo: The patient is from: SNF              Anticipated d/c is to: SNF              Patient currently is not medically stable to d/c.   Difficult to place patient No        Vernelle Emerald MD [facility] Pager [phone]  If 7PM-7AM, please contact night-coverage [email or web address] Use universal [place] password for that web site. If you do not have the password, please call the hospital operator.  02/09/2021, 1:25 AM

## 2021-02-09 NOTE — ED Notes (Signed)
Report called. RN denied questions or concerns @ this time. Monitoring devices in place. VSS. Mother given pt's necklace. All other valuables went w/ pt.

## 2021-02-09 NOTE — Progress Notes (Signed)
   02/09/21 0403  Assess: MEWS Score  Temp (!) 101 F (38.3 C)  BP (!) 166/84  Pulse Rate (!) 103  Resp 20  Level of Consciousness Alert  SpO2 92 %  O2 Device Room Air  Assess: MEWS Score  MEWS Temp 1  MEWS Systolic 0  MEWS Pulse 1  MEWS RR 0  MEWS LOC 0  MEWS Score 2  MEWS Score Color Yellow  Assess: if the MEWS score is Yellow or Red  Were vital signs taken at a resting state? Yes  Focused Assessment Change from prior assessment (see assessment flowsheet)  Early Detection of Sepsis Score *See Row Information* High  MEWS guidelines implemented *See Row Information* Yes  Treat  MEWS Interventions Administered prn meds/treatments;Administered scheduled meds/treatments  Take Vital Signs  Increase Vital Sign Frequency  Yellow: Q 2hr X 2 then Q 4hr X 2, if remains yellow, continue Q 4hrs  Escalate  MEWS: Escalate Yellow: discuss with charge nurse/RN and consider discussing with provider and RRT  Notify: Charge Nurse/RN  Name of Charge Nurse/RN Notified Shanell, RN  Date Charge Nurse/RN Notified 02/09/21  Time Charge Nurse/RN Notified 0409  Notify: Provider  Provider Name/Title Shalhoub, RN  Date Provider Notified 02/09/21  Time Provider Notified [phone]  Notification Type Page  Notification Reason Change in status  Provider response No new orders  Date of Provider Response 02/09/21  Time of Provider Response 0410  Document  Patient Outcome Stabilized after interventions  Progress note created (see row info) Yes

## 2021-02-09 NOTE — Progress Notes (Signed)
Pt temp. 102.8 orally, BP 104/68 MAP 81 Sp02 95 placed on 2L [place], HR 102. Previously administered PRN Tylenol @ 0406 with temp of 101. Shalhoub, MD made aware. Awaiting new orders. Will continue to monitor.   Bari Edward, RN

## 2021-02-09 NOTE — [place] FL2 (Signed)
[place] MEDICAID FL2 LEVEL OF CARE SCREENING TOOL     IDENTIFICATION  Patient Name: Karla Hoover Birthdate: [birth year] Sex: female Admission Date (Current Location): 02/08/2021  [facility] and [place] Number:  Producer, television/film/video and Address:  [facility], [address], [place], [place] [address]      Provider Number: [id number]  Attending Physician Name and Address:  Kathlen Mody, MD  Relative Name and Phone Number:  Rinaldo Cloud [phone]    Current Level of Care: Hospital Recommended Level of Care: Skilled Nursing Facility Prior Approval Number:    Date Approved/Denied:   PASRR Number: [id number]  Discharge Plan: SNF    Current Diagnoses: Patient Active Problem List   Diagnosis Date Noted  . Acute renal failure superimposed on stage 3a chronic kidney disease (HCC) 02/09/2021  . Chronic respiratory failure with hypoxia (HCC) 02/09/2021  . History of thromboembolism 02/09/2021  . Type 2 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 disease, with long-term current use of insulin (HCC) 02/09/2021  . Mixed 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 (HCC) 02/09/2021  . GERD without esophagitis 02/09/2021  . Hypothyroidism 02/09/2021  . Pyelonephritis of right kidney 02/09/2021  . Sepsis secondary to UTI (HCC) 02/08/2021  . Lymphadenopathy 12/28/2020  . VTE (venous thromboembolism) 12/01/2019  . Overweight (BMI 25.0-29.9) 11/22/2019  . Chronic constipation 11/22/2019  . Tobacco abuse 11/16/2019  . Elevated troponin 11/16/2019  . Bilateral pulmonary embolism (HCC) 11/15/2019  . DM (diabetes mellitus), type 2 (HCC) 11/15/2019  . History of stroke 11/15/2019  . Essential hypertension 05/04/2009  . 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    Orientation RESPIRATION BLADDER Height & Weight     Self,Time,Situation,Place  Normal Continent Weight: 197 lb (89.4 kg) Height:  5\' 9"  (175.3 cm)  BEHAVIORAL  SYMPTOMS/MOOD NEUROLOGICAL BOWEL NUTRITION STATUS      Continent Diet (see dc summary)  AMBULATORY STATUS COMMUNICATION OF NEEDS Skin   Limited Assist Verbally Normal                       Personal Care Assistance Level of Assistance  Bathing,Feeding,Dressing Bathing Assistance: Limited assistance Feeding assistance: Independent Dressing Assistance: Limited assistance     Functional Limitations Info  Sight,Hearing,Speech Sight Info: Adequate Hearing Info: Adequate Speech Info: Adequate    SPECIAL CARE FACTORS FREQUENCY  PT (By licensed PT),OT (By licensed OT)     PT Frequency: 5x week OT Frequency: 5x week            Contractures Contractures Info: Not present    Additional Factors Info  Code Status,Allergies,Psychotropic,Insulin Sliding Scale Code Status Info: FULL Allergies Info: Guaifenesin   Kiwi Extract   Levaquin (Levofloxacin In D5w)   Strawberry Extract   Baclofen Psychotropic Info: gabapentin (NEURONTIN) capsule 300 mg 3X per day, ALPRAZolam (XANAX) tablet 0.5 mg 2X day, traMADol (ULTRAM) tablet 50 mg EVERY 6 HOURS PRN Insulin Sliding Scale Info: insulin aspart (novoLOG) injection 0-15 Units 3X PER DAY AND AT BEDTIME       Current Medications (02/09/2021):  This is the current hospital active medication list Current Facility-Administered Medications  Medication Dose Route Frequency Provider Last Rate Last Admin  . 0.9 %  sodium chloride infusion   Intravenous Continuous Shalhoub, 04/11/2021, MD 125 mL/hr at 02/09/21 0349 Rate Change at 02/09/21 0349  . acetaminophen (TYLENOL) tablet 650 mg  650 mg Oral Q6H PRN 04/11/21, MD   650 mg at 02/09/21 1320  Or  . acetaminophen (TYLENOL) suppository 650 mg  650 mg Rectal Q6H PRN Shalhoub, Deno Lunger, MD      . albuterol (VENTOLIN HFA) 108 (90 Base) MCG/ACT inhaler 2 puff  2 puff Inhalation Q6H PRN Shalhoub, Deno Lunger, MD      . ALPRAZolam Prudy Feeler) tablet 0.5 mg  0.5 mg Oral BID PRN Marinda Elk, MD   0.5  mg at 02/09/21 0311  . apixaban (ELIQUIS) tablet 5 mg  5 mg Oral BID Marinda Elk, MD   5 mg at 02/09/21 0229  . aspirin chewable tablet 81 mg  81 mg Oral q AM Marinda Elk, MD   81 mg at 02/09/21 0557  . atorvastatin (LIPITOR) tablet 80 mg  80 mg Oral QPM Shalhoub, Deno Lunger, MD      . cefTRIAXone (ROCEPHIN) 1 g in sodium chloride 0.9 % 100 mL IVPB  1 g Intravenous QHS Shalhoub, Deno Lunger, MD 200 mL/hr at 02/09/21 0233 1 g at 02/09/21 0233  . cyclobenzaprine (FLEXERIL) tablet 10 mg  10 mg Oral TID PRN Marinda Elk, MD   10 mg at 02/09/21 1321  . fluticasone furoate-vilanterol (BREO ELLIPTA) 100-25 MCG/INH 1 puff  1 puff Inhalation Daily Shalhoub, Deno Lunger, MD   1 puff at 02/09/21 0901  . gabapentin (NEURONTIN) capsule 300 mg  300 mg Oral TID Marinda Elk, MD   300 mg at 02/09/21 1321  . insulin aspart (novoLOG) injection 0-15 Units  0-15 Units Subcutaneous TID AC & HS Shalhoub, Deno Lunger, MD   2 Units at 02/09/21 1321  . insulin glargine (LANTUS) injection 17 Units  17 Units Subcutaneous QHS Shalhoub, Deno Lunger, MD      . levothyroxine (SYNTHROID) tablet 50 mcg  50 mcg Oral Q0600 Marinda Elk, MD   50 mcg at 02/09/21 0557  . melatonin tablet 10 mg  10 mg Oral QHS PRN Shalhoub, Deno Lunger, MD      . metoprolol tartrate (LOPRESSOR) tablet 25 mg  25 mg Oral BID Marinda Elk, MD   25 mg at 02/09/21 0229  . ondansetron (ZOFRAN) tablet 4 mg  4 mg Oral Q6H PRN Shalhoub, Deno Lunger, MD       Or  . ondansetron [facility]) injection 4 mg  4 mg Intravenous Q6H PRN Shalhoub, Deno Lunger, MD      . polyethylene glycol (MIRALAX / GLYCOLAX) packet 17 g  17 g Oral Daily PRN Shalhoub, Deno Lunger, MD      . traMADol Janean Sark) tablet 50 mg  50 mg Oral Q6H PRN Shalhoub, Deno Lunger, MD         Discharge Medications: Please see discharge summary for a list of discharge medications.  Relevant Imaging Results:  Relevant Lab Results:   Additional Information    Infantof Villagomez B Gwenneth Whiteman,  LCSWA

## 2021-02-09 NOTE — Progress Notes (Signed)
Pt admitted earlier this am by [facility] [place], please see his note for detailed H&P/ .  50 year old female with past medical history of COPD, chronic respiratory failure (2lpm via [place] with exertion and sleep), chronic kidney disease stage IIIa, bilateral pulmonary emboli as well as a left lower extremity DVT (11/2019 on Eliquis), right RCA stroke with left-sided weakness, nicotine dependence, hypothyroidism,  insulin-dependent diabetes mellitus type 2 gastroesophageal reflux disease who presents to [facility] emergency department due to fevers and generalized body aches from [facility].  She was found to have sepsis from  right pyelonephritis. She was started on IV antibiotics and Iv fluids.  Recommended to follow blood cultures. Patient seen and examined at bedside.    Kathlen Mody , MD

## 2021-02-09 NOTE — ED Notes (Signed)
Report attempted, Stated to give 10 minutes then call back.

## 2021-02-09 NOTE — TOC Initial Note (Signed)
Transition of Care [facility]) - Initial/Assessment Note    Patient Details  Name: Karla Hoover MRN: [id number] Date of Birth: 1970/11/21  Transition of Care [facility]) CM/SW Contact:    Terrial Rhodes, LCSWA Phone Number: 02/09/2021, 1:23 PM  Clinical Narrative:                   CSW received consult for possible SNF placement at time of discharge. CSW spoke with patient and patients mother at bedside regarding PT recommendation of SNF placement at time of discharge. Patient comes from [facility]. Patient expressed understanding of PT recommendation and is agreeable to SNF placement at time of discharge. Patient confirmed she would like to return to [facility] for short term rehab .  Patient has not received the COVID vaccines. No further questions reported at this time. CSW to continue to follow and assist with discharge planning needs.  Expected Discharge Plan: Skilled Nursing Facility Barriers to Discharge: Continued Medical Work up   Patient Goals and CMS Choice Patient states their goals for this hospitalization and ongoing recovery are:: to go to SNF CMS Medicare.gov Compare Post Acute Care list provided to:: Patient Choice offered to / list presented to : Patient  Expected Discharge Plan and Services Expected Discharge Plan: Skilled Nursing Facility In-house Referral: Clinical Social Work     Living arrangements for the past 2 months: Skilled Nursing Facility                                      Prior Living Arrangements/Services Living arrangements for the past 2 months: Skilled Nursing Facility Lives with:: Self,Facility Resident (From [place]) Patient language and need for interpreter reviewed:: Yes Do you feel safe going back to the place where you live?: No   SNF  Need for Family Participation in Patient Care: Yes (Comment) Care giver support system in place?: Yes (comment)   Criminal Activity/Legal Involvement Pertinent to Current  Situation/Hospitalization: No - Comment as needed  Activities of Daily Living Home Assistive Devices/Equipment: Dan Humphreys (specify type) ADL Screening (condition at time of admission) Patient's cognitive ability adequate to safely complete daily activities?: No Is the patient deaf or have difficulty hearing?: No Does the patient have difficulty seeing, even when wearing glasses/contacts?: No Does the patient have difficulty concentrating, remembering, or making decisions?: Yes Patient able to express need for assistance with ADLs?: Yes Does the patient have difficulty dressing or bathing?: Yes Independently performs ADLs?: No Communication: Independent Dressing (OT): Independent Grooming: Needs assistance Is this a change from baseline?: Pre-admission baseline Feeding: Dependent Is this a change from baseline?: Pre-admission baseline Bathing: Dependent Is this a change from baseline?: Pre-admission baseline Toileting: Needs assistance Is this a change from baseline?: Pre-admission baseline In/Out Bed: Needs assistance Is this a change from baseline?: Pre-admission baseline Walks in Home: Needs assistance Is this a change from baseline?: Pre-admission baseline Does the patient have difficulty walking or climbing stairs?: Yes Weakness of Legs: Left Weakness of Arms/Hands: Left  Permission Sought/Granted Permission sought to share information with : Case Manager,Family Electrical engineer Permission granted to share information with : Yes, Verbal Permission Granted     Permission granted to share info w AGENCY: SNF        Emotional Assessment Appearance:: Appears stated age Attitude/Demeanor/Rapport: Gracious Affect (typically observed): Calm Orientation: : Oriented to Self,Oriented to Place,Oriented to  Time,Oriented to Situation Alcohol / Substance Use: Not  Applicable Psych Involvement: No (comment)  Admission diagnosis:  Tachycardia [R00.0] Sepsis  secondary to UTI (HCC) [A41.9, N39.0] Pyelonephritis of right kidney [N12] Patient Active Problem List   Diagnosis Date Noted  . Acute renal failure superimposed on stage 3a chronic kidney disease (HCC) 02/09/2021  . Chronic respiratory failure with hypoxia (HCC) 02/09/2021  . History of thromboembolism 02/09/2021  . Type 2 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 disease, with long-term current use of insulin (HCC) 02/09/2021  . Mixed 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 (HCC) 02/09/2021  . GERD without esophagitis 02/09/2021  . Hypothyroidism 02/09/2021  . Pyelonephritis of right kidney 02/09/2021  . Sepsis secondary to UTI (HCC) 02/08/2021  . Lymphadenopathy 12/28/2020  . VTE (venous thromboembolism) 12/01/2019  . Overweight (BMI 25.0-29.9) 11/22/2019  . Chronic constipation 11/22/2019  . Tobacco abuse 11/16/2019  . Elevated troponin 11/16/2019  . Bilateral pulmonary embolism (HCC) 11/15/2019  . DM (diabetes mellitus), type 2 (HCC) 11/15/2019  . History of stroke 11/15/2019  . Essential hypertension 05/04/2009  . 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   PCP:  Patient, [name] Pharmacy:  No Pharmacies Listed    Social Determinants of Health (SDOH) Interventions    Readmission Risk Interventions No flowsheet data found.

## 2021-02-09 NOTE — Evaluation (Signed)
Physical Therapy Evaluation Patient Details Name: Karla Hoover MRN: [id number] DOB: [birth year] Today's Date: 02/09/2021   History of Present Illness  pt is a 50 y/o female presenting 5/5  from [place]  with fevers, generalized body aches and progressive weakness thought to be due to UTI or kidney injury/infection.  PMHx: COPD, CKD3a, CRF with hypoxia, DM, HTN, PE, [place] with L residual weakness.  Clinical Impression  Pt admitted with/for progressive weakness due to kidney infection.  Pt not quite at baseline needing min guard to supervision assist..  Pt currently limited functionally due to the problems listed. ( See problems list.)   Pt will benefit from PT to maximize function and safety in order to get ready for next venue listed below.     Follow Up Recommendations SNF;Supervision - Intermittent;Other (comment) (continue therapies)    Equipment Recommendations  None recommended by PT    Recommendations for Other Services       Precautions / Restrictions Precautions Precautions: Fall      Mobility  Bed Mobility Overal bed mobility: Needs Assistance Bed Mobility: Supine to Sit     Supine to sit: Min guard     General bed mobility comments: up and forward to EOB with assist, but with raised HOB and rail.  Pt reports not quite as easy to get to EOB today.    Transfers Overall transfer level: Needs assistance Equipment used: None Transfers: Sit to/from [employer] Sit to Stand: Min guard Stand pivot transfers: Supervision       General transfer comment: uses R UE appropriately, slow to rise from low surface.  Ambulation/Gait Ambulation/Gait assistance: Supervision Gait Distance (Feet): 80 Feet (with 1 standing rest.) Assistive device: IV Pole (to simulate hemiwalker.) Gait Pattern/deviations: Step-to pattern;Decreased step length - right;Decreased step length - left;Decreased stance time - left;Decreased stride length   Gait velocity  interpretation: <1.31 ft/sec, indicative of household ambulator General Gait Details: stable paretic gait with step to pattern, poor heel/toe with L LE in ER pre swing, IV pole used in place of the hemiwalker.  Stairs            Wheelchair Mobility    Modified Rankin (Stroke Patients Only)       Balance Overall balance assessment: Needs assistance Sitting-balance support: No upper extremity supported;Feet supported Sitting balance-Leahy Scale: Good Sitting balance - Comments: donns shoes without need for assist, moves outside BOS   Standing balance support: No upper extremity supported;During functional activity Standing balance-Leahy Scale: Fair                               Pertinent Vitals/Pain Pain Assessment: No/denies pain    Home Living Family/patient expects to be discharged to:: Skilled nursing facility [facility]) Living Arrangements: Parent               Additional Comments: Mom close by in [place]    Prior Function Level of Independence: Needs assistance   Gait / Transfers Assistance Needed: mobilizes in the facility without assist using a hemiwalker.  Uses bathroom without assist, has grab bar on appropriate side to assist.  supervised in/out of shower.           Hand Dominance        Extremity/Trunk Assessment   Upper Extremity Assessment Upper Extremity Assessment: LUE deficits/detail LUE Deficits / Details: paretic, pt keeps hand from drawing/contracture as well as elbow, but with flexion tone.  LUE Coordination: decreased fine motor    Lower Extremity Assessment Lower Extremity Assessment: LLE deficits/detail;RLE deficits/detail RLE Deficits / Details: WFL LLE Deficits / Details: paretic, moves synergistically, functional paretic gait. LLE Coordination: decreased fine motor       Communication   Communication: No difficulties  Cognition Arousal/Alertness: Awake/alert Behavior During Therapy:  WFL for tasks assessed/performed Overall Cognitive Status: Within Functional Limits for tasks assessed                                        General Comments General comments (skin integrity, edema, etc.): vss on RA with sats in the mid 90's and HR 80's ot 90's bpm.    Exercises     Assessment/Plan    PT Assessment Patient needs continued PT services  PT Problem List Decreased strength;Decreased activity tolerance;Decreased mobility;Decreased balance       PT Treatment Interventions Gait training;Functional mobility training;Therapeutic activities;Balance training;Neuromuscular re-education;Patient/family education    PT Goals (Current goals can be found in the Care Plan section)  Acute Rehab PT Goals Patient Stated Goal: get back to rehab PT Goal Formulation: With patient Time For Goal Achievement: 02/23/21 Potential to Achieve Goals: Good    Frequency Min 3X/week   Barriers to discharge        Co-evaluation               AM-PAC PT "6 Clicks" Mobility  Outcome Measure Help needed turning from your back to your side while in a flat bed without using bedrails?: A Little Help needed moving from lying on your back to sitting on the side of a flat bed without using bedrails?: A Little Help needed moving to and from a bed to a chair (including a wheelchair)?: A Little Help needed standing up from a chair using your arms (e.g., wheelchair or bedside chair)?: A Little Help needed to walk in hospital room?: A Little Help needed climbing 3-5 steps with a railing? : A Little 6 Click Score: 18    End of Session   Activity Tolerance: Patient tolerated treatment well Patient left: in chair;with call bell/phone within reach;with chair alarm set;with family/visitor present Nurse Communication: Mobility status;Other (comment) (need to walk halls 2 or more x/day) PT Visit Diagnosis: Other abnormalities of gait and mobility (R26.89);Muscle weakness (generalized)  (M62.81);Difficulty in walking, not elsewhere classified (R26.2)    Time: 1856-3149 PT Time Calculation (min) (ACUTE ONLY): 34 min   Charges:   PT Evaluation $PT Eval Moderate Complexity: 1 Mod PT Treatments $Gait Training: 8-22 mins        02/09/2021  Jacinto Halim., PT Acute Rehabilitation Services [phone]  (pager) [phone]  (office)  Karla Hoover 02/09/2021, 11:30 AM

## 2021-02-10 ENCOUNTER — Other Ambulatory Visit: Payer: Self-pay

## 2021-02-10 LAB — GLUCOSE, CAPILLARY
Glucose-Capillary: 201 mg/dL — ABNORMAL HIGH (ref 70–99)
Glucose-Capillary: 242 mg/dL — ABNORMAL HIGH (ref 70–99)
Glucose-Capillary: 148 mg/dL — ABNORMAL HIGH (ref 70–99)
Glucose-Capillary: 204 mg/dL — ABNORMAL HIGH (ref 70–99)

## 2021-02-10 LAB — URINE CULTURE: Culture: NO GROWTH

## 2021-02-10 MED ORDER — MAGNESIUM SULFATE 2 GM/50ML IV SOLN
2.0000 g | Freq: Once | INTRAVENOUS | Status: AC
  Administered 2021-02-10: 2 g via INTRAVENOUS
  Filled 2021-02-10: qty 50

## 2021-02-10 MED ORDER — INSULIN GLARGINE 100 UNIT/ML ~~LOC~~ SOLN
20.0000 [IU] | Freq: Every day | SUBCUTANEOUS | Status: DC
  Administered 2021-02-10 – 2021-02-11 (×2): 20 [IU] via SUBCUTANEOUS
  Filled 2021-02-10 (×4): qty 0.2

## 2021-02-10 MED ORDER — SENNOSIDES-DOCUSATE SODIUM 8.6-50 MG PO TABS
2.0000 | ORAL_TABLET | Freq: Two times a day (BID) | ORAL | Status: DC
  Administered 2021-02-10 – 2021-02-12 (×4): 2 via ORAL
  Filled 2021-02-10 (×4): qty 2

## 2021-02-10 NOTE — Progress Notes (Signed)
PHARMACY - PHYSICIAN COMMUNICATION CRITICAL VALUE ALERT - BLOOD CULTURE IDENTIFICATION (BCID)  Karla Hoover is an 50 y.o. female who presented to [facility] on 02/08/2021 with a chief complaint of fever/sepsis  Assessment:   1/2 blood cultures growing Gram positive rods, likely contaminant  Name of physician (or Provider) Contacted: Dr. Antionette Char  Current antibiotics:  Rocephin  Changes to prescribed antibiotics recommended:  None at this time  No results found for this or any previous visit.  Eddie Candle 02/10/2021  5:04 AM

## 2021-02-10 NOTE — Progress Notes (Signed)
PROGRESS NOTE    Karla Hoover  RJJ:[id number] DOB: 02/21/1971 DOA: 02/08/2021 PCP: Patient, [name]   Chief Complaint  Patient presents with  . UTI symptoms    Brief Narrative:   50 year old female with past medical history of COPD, chronic respiratory failure(2lpm via [place] with exertion and sleep),chronic kidney disease stage IIIa, bilateral pulmonary emboli as well as a left lower extremity DVT (11/2019 on Eliquis),right RCA stroke with left-sided weakness, nicotine dependence, hypothyroidism, insulin-dependent diabetes mellitus type 2gastroesophageal reflux disease who presents to [facility] emergency department due to fevers and generalized body aches from [facility].  She was found to have sepsis from  right pyelonephritis. She was started on IV antibiotics and Iv fluids.  Assessment & Plan:   Principal Problem:   Sepsis secondary to UTI [facility]) Active Problems:   Essential hypertension   COPD with chronic bronchitis (HCC)   Acute renal failure superimposed on stage 3a chronic kidney disease (HCC)   Chronic respiratory failure with hypoxia (HCC)   History of thromboembolism   Type 2 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 disease, with long-term current use of insulin (HCC)   Mixed 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 (HCC)   GERD without esophagitis   Hypothyroidism   Pyelonephritis of right kidney   Sepsis secondary to right pyelonephritis. She was started on IV Rocephin, continue the same.  Blood cultures 1/2 growing gram-positive rods likely contaminant.  No change in antibiotics. Urine cultures were negative.  He of the abdomen pelvis showsRight perinephric stranding correlating with history of pyelonephritis. No hydronephrosis or detected abscess. Low-grade temperature of 100.2 and leukocytosis improving.  Repeat labs tomorrow.   Essential  hypertension Blood pressure parameters normal limits.   COPD with chronic bronchitis Continue with bronchodilators as needed.    Acute renal failure superimposed on stage IIIa CKD Continue with fluids and recheck renal parameters in the morning.    Type 2 diabetes mellitus with long-term use of insulin and stage IIIa CKD CBG (last 3)  Recent Labs    02/09/21 2108 02/10/21 0751 02/10/21 1121  GLUCAP 180* 201* 242*   Uncontrolled with hyperglycemia Increase Lantus to 20 units daily and continue with moderate SSI. Continue with gabapentin for the diabetic neuropathy. Globin A1c at 9.9    History of thromboembolic disease. Continue with Eliquis 5 mg twice daily.   History of stroke with left-sided residual weakness, bedbound Therapy evaluation recommending SNF. Continue with Eliquis and aspirin and Lipitor.  Next   Hyperlipidemia Continue with Lipitor 80 mg daily.    Hypothyroidism Continue with Synthroid 50 MCG daily. Check TSH   GERD  Stable.   DVT prophylaxis: eliquis.  Code Status: (Full code Family Communication: None at bedside   disposition:   Status is: Inpatient  Remains inpatient appropriate because:IV treatments appropriate due to intensity of illness or inability to take PO   Dispo: The patient is from: SNF              Anticipated d/c is to: SNF              Patient currently is not medically stable to d/c.   Difficult to place patient No       Consultants:   None.    Procedures: none.   Antimicrobials:  Antibiotics Given (last 72 hours)    Date/Time Action Medication Dose Rate   02/09/21 0233 New Bag/Given   cefTRIAXone (ROCEPHIN) 1  g in sodium chloride 0.9 % 100 mL IVPB 1 g 200 mL/hr   02/09/21 2157 New Bag/Given   cefTRIAXone (ROCEPHIN) 1 g in sodium chloride 0.9 % 100 mL IVPB 1 g 200 mL/hr          Subjective: No chest pain or sob, no nausea or vomiting.   Objective: Vitals:   02/09/21 1119 02/09/21 2112  02/10/21 0500 02/10/21 0930  BP: 119/90 (!) 171/94 127/76   Pulse: 92  88   Resp: 20 18 18    Temp: 97.7 F (36.5 C) 100.2 F (37.9 C) 98.3 F (36.8 C)   TempSrc: Oral Oral Oral   SpO2: 94% 98% 98% 98%  Weight:      Height:        Intake/Output Summary (Last 24 hours) at 02/10/2021 1253 Last data filed at 02/10/2021 0500 Gross per 24 hour  Intake --  Output 1400 ml  Net -1400 ml   Filed Weights   02/08/21 2026  Weight: 89.4 kg    Examination:  General exam: Appears calm and comfortable  Respiratory system: Clear to auscultation. Respiratory effort normal. Cardiovascular system: S1 & S2 heard, RRR. No JVD,  No pedal edema. Gastrointestinal system: Abdomen is nondistended, soft and nontender.  Normal bowel sounds heard. Central nervous system: Alert and oriented. No focal neurological deficits. Extremities: Symmetric 5 x 5 power. Skin: No rashes, lesions or ulcers Psychiatry:Mood & affect appropriate.     Data Reviewed: I have personally reviewed following labs and imaging studies  CBC: Recent Labs  Lab 02/08/21 2042 02/09/21 0236  WBC 16.7* 13.7*  NEUTROABS 12.7* 10.2*  HGB 12.7 12.7  HCT 39.4 39.4  MCV 85.3 85.1  PLT 247 198    Basic Metabolic Panel: Recent Labs  Lab 02/08/21 2042 02/09/21 0236  NA 130* 132*  K 4.2 4.1  CL 95* 95*  CO2 25 27  GLUCOSE 240* 204*  BUN 25* 23*  CREATININE 1.74* 1.63*  CALCIUM 9.0 9.1  MG  --  1.7    GFR: Estimated Creatinine Clearance: 50.3 mL/min (A) (by C-G formula based on SCr of 1.63 mg/dL (H)).  Liver Function Tests: Recent Labs  Lab 02/08/21 2042 02/09/21 0236  AST 22 20  ALT 17 15  ALKPHOS 102 103  BILITOT 0.8 1.0  PROT 7.0 7.3  ALBUMIN 3.1* 2.9*    CBG: Recent Labs  Lab 02/09/21 1120 02/09/21 1641 02/09/21 2108 02/10/21 0751 02/10/21 1121  GLUCAP 143* 175* 180* 201* 242*     Recent Results (from the past 240 hour(s))  Urine culture     Status: None   Collection Time: 02/08/21  8:42 PM    Specimen: Urine, Catheterized  Result Value Ref Range Status   Specimen Description URINE, CATHETERIZED  Final   Special Requests NONE  Final   Culture   Final    NO GROWTH Performed at [facility], [address] [place], [place]    Report Status 02/10/2021 FINAL  Final  SARS CORONAVIRUS 2 (TAT 6-24 HRS) Nasopharyngeal Nasopharyngeal Swab     Status: None   Collection Time: 02/08/21  9:21 PM   Specimen: Nasopharyngeal Swab  Result Value Ref Range Status   SARS Coronavirus 2 NEGATIVE NEGATIVE Final    Comment: (NOTE) SARS-CoV-2 target nucleic acids are NOT DETECTED.  The SARS-CoV-2 RNA is generally detectable in upper and lower respiratory specimens during the acute phase of infection. Negative results do not preclude SARS-CoV-2 infection, do not rule out  co-infections with other pathogens, and should not be used as the sole basis for treatment or other patient management decisions. Negative results must be combined with clinical observations, patient history, and epidemiological information. The expected result is Negative.  Fact Sheet for Patients: HairSlick.nohttps://www.fda.gov/media/138098/download  Fact Sheet for Healthcare Providers: quierodirigir.comhttps://www.fda.gov/media/138095/download  This test is not yet approved or cleared by the [place] FDA and  has been authorized for detection and/or diagnosis of SARS-CoV-2 by FDA under an Emergency Use Authorization (EUA). This EUA will remain  in effect (meaning this test can be used) for the duration of the COVID-19 declaration under Se ction 564(b)(1) of the Act, 21 U.S.C. section 360bbb-3(b)(1), unless the authorization is terminated or revoked sooner.  Performed at [facility], [address] [place], [place] [address]   Blood culture (routine x 2)     Status: None (Preliminary result)   Collection Time: 02/08/21 11:07 PM   Specimen: BLOOD  Result Value Ref Range Status   Specimen Description BLOOD SITE  NOT SPECIFIED  Final   Special Requests   Final    BOTTLES DRAWN AEROBIC AND ANAEROBIC Blood Culture results may not be optimal due to an inadequate volume of blood received in culture bottles   Culture   Final    NO GROWTH 2 DAYS Performed at [facility], [address] [place], [place] [address]    Report Status PENDING  Incomplete  Blood culture (routine x 2)     Status: None (Preliminary result)   Collection Time: 02/08/21 11:09 PM   Specimen: BLOOD  Result Value Ref Range Status   Specimen Description BLOOD SITE NOT SPECIFIED  Final   Special Requests   Final    BOTTLES DRAWN AEROBIC AND ANAEROBIC Blood Culture results may not be optimal due to an inadequate volume of blood received in culture bottles   Culture  Setup Time   Final    AEROBIC BOTTLE ONLY GRAM POSITIVE RODS CRITICAL RESULT CALLED TO, READ BACK BY AND VERIFIED WITH: G ABBOTT PHARMD 02/10/21 0420 JDW    Culture   Final    NO GROWTH 2 DAYS Performed at [facility], [address] [place], [place] [address]    Report Status PENDING  Incomplete         Radiology Studies: CT ABDOMEN PELVIS WO CONTRAST  Result Date: 02/09/2021 CLINICAL DATA:  Pyelonephritis.  Uncomplicated sepsis. EXAM: CT ABDOMEN AND PELVIS WITHOUT CONTRAST TECHNIQUE: Multidetector CT imaging of the abdomen and pelvis was performed following the standard protocol without IV contrast. COMPARISON:  07/08/2020 FINDINGS: Incomplete coverage of the extreme upper abdomen. Lower chest:  No contributory findings. Hepatobiliary: No focal liver abnormality.No evidence of biliary obstruction or stone. Pancreas: Unremarkable. Spleen: Unremarkable. Adrenals/Urinary Tract: Negative adrenals. Right perinephric stranding which may relate to the history of pyelonephritis. There is right more than left renal cortical thinning and lobulation from scarring. Right upper pole cyst by prior enhanced CT, measuring 22 mm. 3 mm right renal calculus. No  hydronephrosis. Unremarkable bladder. Stomach/Bowel:  No obstruction. No appendicitis. Vascular/Lymphatic: No acute vascular abnormality. No mass or adenopathy. Reproductive:Deviated uterus towards the right, unchanged and without endometrial dysmorphism on prior enhanced study. Other: No ascites or pneumoperitoneum. Musculoskeletal: No acute abnormalities. IMPRESSION: 1. Right perinephric stranding correlating with history of pyelonephritis. No hydronephrosis or detected abscess. A right upper pole renal cyst remain simple in density. 2. Right more than left renal scarring. 3. 3 mm right renal calculus. Electronically Signed  By: Marnee Spring M.D.   On: 02/09/2021 04:38   DG Chest Portable 1 View  Result Date: 02/08/2021 CLINICAL DATA:  Fever EXAM: PORTABLE CHEST 1 VIEW COMPARISON:  11/15/2019 FINDINGS: The heart size and mediastinal contours are within normal limits. Both lungs are clear. The visualized skeletal structures are unremarkable. IMPRESSION: No active disease. Electronically Signed   By: Jasmine Pang M.D.   On: 02/08/2021 22:50        Scheduled Meds: . apixaban  5 mg Oral BID  . aspirin  81 mg Oral q AM  . atorvastatin  80 mg Oral QPM  . fluticasone furoate-vilanterol  1 puff Inhalation Daily  . gabapentin  300 mg Oral TID  . insulin aspart  0-15 Units Subcutaneous TID AC & HS  . insulin glargine  17 Units Subcutaneous QHS  . levothyroxine  50 mcg Oral Q0600  . metoprolol tartrate  25 mg Oral BID   Continuous Infusions: . cefTRIAXone (ROCEPHIN)  IV 1 g (02/09/21 2157)  . magnesium sulfate bolus IVPB       LOS: 1 day        Kathlen Mody, MD [facility]   To contact the attending provider between 7A-7P or the covering provider during after hours 7P-7A, please log into the web site [email or web address] and access using universal [place] password for that web site. If you do not have the password, please call the hospital operator.  02/10/2021, 12:53 PM

## 2021-02-11 LAB — CBC WITH DIFFERENTIAL/PLATELET
Abs Immature Granulocytes: 0.03 10*3/uL (ref 0.00–0.07)
Basophils Absolute: 0 10*3/uL (ref 0.0–0.1)
Basophils Relative: 0 %
Eosinophils Absolute: 0.3 10*3/uL (ref 0.0–0.5)
Eosinophils Relative: 3 %
HCT: 36.2 % (ref 36.0–46.0)
Hemoglobin: 11.5 g/dL — ABNORMAL LOW (ref 12.0–15.0)
Immature Granulocytes: 0 %
Lymphocytes Relative: 37 %
Lymphs Abs: 3.3 10*3/uL (ref 0.7–4.0)
MCH: 27.2 pg (ref 26.0–34.0)
MCHC: 31.8 g/dL (ref 30.0–36.0)
MCV: 85.6 fL (ref 80.0–100.0)
Monocytes Absolute: 0.9 10*3/uL (ref 0.1–1.0)
Monocytes Relative: 10 %
Neutro Abs: 4.4 10*3/uL (ref 1.7–7.7)
Neutrophils Relative %: 50 %
Platelets: 242 10*3/uL (ref 150–400)
RBC: 4.23 MIL/uL (ref 3.87–5.11)
RDW: 13.1 % (ref 11.5–15.5)
WBC: 8.9 10*3/uL (ref 4.0–10.5)
nRBC: 0 % (ref 0.0–0.2)

## 2021-02-11 LAB — BASIC METABOLIC PANEL
Anion gap: 7 (ref 5–15)
BUN: 16 mg/dL (ref 6–20)
CO2: 31 mmol/L (ref 22–32)
Calcium: 9 mg/dL (ref 8.9–10.3)
Chloride: 97 mmol/L — ABNORMAL LOW (ref 98–111)
Creatinine, Ser: 1.32 mg/dL — ABNORMAL HIGH (ref 0.44–1.00)
GFR, Estimated: 50 mL/min — ABNORMAL LOW (ref >60)
Glucose, Bld: 109 mg/dL — ABNORMAL HIGH (ref 70–99)
Potassium: 4.4 mmol/L (ref 3.5–5.1)
Sodium: 135 mmol/L (ref 135–145)

## 2021-02-11 LAB — CULTURE, BLOOD (ROUTINE X 2)

## 2021-02-11 LAB — MAGNESIUM: Magnesium: 2.6 mg/dL — ABNORMAL HIGH (ref 1.7–2.4)

## 2021-02-11 LAB — GLUCOSE, CAPILLARY
Glucose-Capillary: 137 mg/dL — ABNORMAL HIGH (ref 70–99)
Glucose-Capillary: 184 mg/dL — ABNORMAL HIGH (ref 70–99)
Glucose-Capillary: 159 mg/dL — ABNORMAL HIGH (ref 70–99)
Glucose-Capillary: 164 mg/dL — ABNORMAL HIGH (ref 70–99)

## 2021-02-11 MED ORDER — ASPIRIN 81 MG PO CHEW
81.0000 mg | CHEWABLE_TABLET | Freq: Every morning | ORAL | Status: DC
  Administered 2021-02-12: 81 mg via ORAL
  Filled 2021-02-11: qty 1

## 2021-02-11 NOTE — Progress Notes (Signed)
PROGRESS NOTE    Karla Hoover  HRC:[id number] DOB: 1971-01-28 DOA: 02/08/2021 PCP: Patient, [name]   Chief Complaint  Patient presents with  . UTI symptoms    Brief Narrative:   50 year old female with past medical history of COPD, chronic respiratory failure(2lpm via [place] with exertion and sleep),chronic kidney disease stage IIIa, bilateral pulmonary emboli as well as a left lower extremity DVT (11/2019 on Eliquis),right RCA stroke with left-sided weakness, nicotine dependence, hypothyroidism, insulin-dependent diabetes mellitus type 2gastroesophageal reflux disease who presents to [facility] emergency department due to fevers and generalized body aches from [facility].  She was found to have sepsis from  right pyelonephritis. She was started on IV antibiotics and Iv fluids. Blood and urine cultures have been negative.  Pt seen and examined at bedside.  She reports feeling better.   Assessment & Plan:   Principal Problem:   Sepsis secondary to UTI [facility]) Active Problems:   Essential hypertension   COPD with chronic bronchitis (HCC)   Acute renal failure superimposed on stage 3a chronic kidney disease (HCC)   Chronic respiratory failure with hypoxia (HCC)   History of thromboembolism   Type 2 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 disease, with long-term current use of insulin (HCC)   Mixed 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 (HCC)   GERD without esophagitis   Hypothyroidism   Pyelonephritis of right kidney   Sepsis secondary to right pyelonephritis. She was started on IV Rocephin, continue the same.  Blood cultures 1/2 growing gram-positive rods likely contaminant.  No change in antibiotics. Urine cultures were negative.  CT  of the abdomen pelvis shows Right perinephric stranding correlating with history of pyelonephritis. No hydronephrosis or detected  abscess. Low-grade temperature of 100.2 and leukocytosis improving.  Labs this am show normal wbc count and afebrile overnight. She reports her flank pain is improving.    Essential hypertension Well-controlled blood pressure parameters  COPD with chronic bronchitis Continue with bronchodilators as needed. No wheezing heard on exam   Acute renal failure superimposed on stage IIIa CKD Creatinine back to baseline with IV fluids    Type 2 diabetes mellitus with long-term use of insulin and stage IIIa CKD CBG (last 3)  Recent Labs    02/10/21 1721 02/10/21 2107 02/11/21 0759  GLUCAP 148* 204* 137*   Uncontrolled with hyperglycemia Increase Lantus to 20 units daily and continue with moderate SSI. Continue with gabapentin for the diabetic neuropathy. Hemoglobin A1c greater than 9    History of thromboembolic disease. Continue with Eliquis 5 mg twice daily.   History of stroke with left-sided residual weakness, bedbound Therapy evaluation recommending SNF. Continue with Eliquis and aspirin and Lipitor.     Hyperlipidemia Continue with Lipitor 80 mg daily.    Hypothyroidism Continue with Synthroid 50 MCG daily. Check TSH   GERD  Stable.   Constipation:  Continue with senna and colace and miralax.    Hyponatremia:  Resolved with hydration.    DVT prophylaxis: eliquis.  Code Status: (Full code Family Communication: None at bedside , discussed with mom at bedside.   disposition:   Status is: Inpatient  Remains inpatient appropriate because:IV treatments appropriate due to intensity of illness or inability to take PO   Dispo: The patient is from: SNF              Anticipated d/c is to: SNF  Patient currently is not medically stable to d/c.   Difficult to place patient No       Consultants:   None.    Procedures: none.   Antimicrobials:  Antibiotics Given (last 72 hours)    Date/Time Action Medication Dose Rate   02/09/21  0233 New Bag/Given   cefTRIAXone (ROCEPHIN) 1 g in sodium chloride 0.9 % 100 mL IVPB 1 g 200 mL/hr   02/09/21 2157 New Bag/Given   cefTRIAXone (ROCEPHIN) 1 g in sodium chloride 0.9 % 100 mL IVPB 1 g 200 mL/hr   02/10/21 2134 New Bag/Given   cefTRIAXone (ROCEPHIN) 1 g in sodium chloride 0.9 % 100 mL IVPB 1 g 200 mL/hr         Subjective: No chest pain or sob, no nausea or vomiting. Flank pain improved.   Objective: Vitals:   02/10/21 1418 02/10/21 2106 02/11/21 0444 02/11/21 0843  BP: 132/72 (!) 141/99 124/71   Pulse:      Resp: 16 16 15    Temp: 98.8 F (37.1 C) 99.7 F (37.6 C) 98.5 F (36.9 C)   TempSrc: Oral Oral Oral   SpO2: 99%   98%  Weight:      Height:        Intake/Output Summary (Last 24 hours) at 02/11/2021 1113 Last data filed at 02/11/2021 0900 Gross per 24 hour  Intake 150 ml  Output 550 ml  Net -400 ml   Filed Weights   02/08/21 2026  Weight: 89.4 kg    Examination:  General exam: Well-developed lady not in any kind of distress Respiratory system: Air entry fair bilateral no wheezing or rhonchi Cardiovascular system: S1-S2 heard, regular rate rhythm no JVD no pedal edema Gastrointestinal system: Abdomen is soft, nontender bowel sounds normal flank pain on the right is improving Central nervous system: Alert and oriented to place and person and time Extremities: No pedal edema Skin: No rashes seen Psychiatry: Mood is appropriate    Data Reviewed: I have personally reviewed following labs and imaging studies  CBC: Recent Labs  Lab 02/08/21 2042 02/09/21 0236 02/11/21 0111  WBC 16.7* 13.7* 8.9  NEUTROABS 12.7* 10.2* 4.4  HGB 12.7 12.7 11.5*  HCT 39.4 39.4 36.2  MCV 85.3 85.1 85.6  PLT 247 198 242    Basic Metabolic Panel: Recent Labs  Lab 02/08/21 2042 02/09/21 0236 02/11/21 0111  NA 130* 132* 135  K 4.2 4.1 4.4  CL 95* 95* 97*  CO2 25 27 31   GLUCOSE 240* 204* 109*  BUN 25* 23* 16  CREATININE 1.74* 1.63* 1.32*  CALCIUM 9.0  9.1 9.0  MG  --  1.7  --     GFR: Estimated Creatinine Clearance: 62.1 mL/min (A) (by C-G formula based on SCr of 1.32 mg/dL (H)).  Liver Function Tests: Recent Labs  Lab 02/08/21 2042 02/09/21 0236  AST 22 20  ALT 17 15  ALKPHOS 102 103  BILITOT 0.8 1.0  PROT 7.0 7.3  ALBUMIN 3.1* 2.9*    CBG: Recent Labs  Lab 02/10/21 0751 02/10/21 1121 02/10/21 1721 02/10/21 2107 02/11/21 0759  GLUCAP 201* 242* 148* 204* 137*     Recent Results (from the past 240 hour(s))  Urine culture     Status: None   Collection Time: 02/08/21  8:42 PM   Specimen: Urine, Catheterized  Result Value Ref Range Status   Specimen Description URINE, CATHETERIZED  Final   Special Requests NONE  Final   Culture   Final  NO GROWTH Performed at [facility], [address] [place], [place] [address]    Report Status 02/10/2021 FINAL  Final  SARS CORONAVIRUS 2 (TAT 6-24 HRS) Nasopharyngeal Nasopharyngeal Swab     Status: None   Collection Time: 02/08/21  9:21 PM   Specimen: Nasopharyngeal Swab  Result Value Ref Range Status   SARS Coronavirus 2 NEGATIVE NEGATIVE Final    Comment: (NOTE) SARS-CoV-2 target nucleic acids are NOT DETECTED.  The SARS-CoV-2 RNA is generally detectable in upper and lower respiratory specimens during the acute phase of infection. Negative results do not preclude SARS-CoV-2 infection, do not rule out co-infections with other pathogens, and should not be used as the sole basis for treatment or other patient management decisions. Negative results must be combined with clinical observations, patient history, and epidemiological information. The expected result is Negative.  Fact Sheet for Patients: HairSlick.no  Fact Sheet for Healthcare Providers: quierodirigir.com  This test is not yet approved or cleared by the [place] FDA and  has been authorized for detection and/or diagnosis of SARS-CoV-2  by FDA under an Emergency Use Authorization (EUA). This EUA will remain  in effect (meaning this test can be used) for the duration of the COVID-19 declaration under Se ction 564(b)(1) of the Act, 21 U.S.C. section 360bbb-3(b)(1), unless the authorization is terminated or revoked sooner.  Performed at [facility], [address], [place], [place] [address]   Blood culture (routine x 2)     Status: None (Preliminary result)   Collection Time: 02/08/21 11:07 PM   Specimen: BLOOD  Result Value Ref Range Status   Specimen Description BLOOD SITE NOT SPECIFIED  Final   Special Requests   Final    BOTTLES DRAWN AEROBIC AND ANAEROBIC Blood Culture results may not be optimal due to an inadequate volume of blood received in culture bottles   Culture   Final    NO GROWTH 3 DAYS Performed at [facility], [address], [place], [place] [address]    Report Status PENDING  Incomplete  Blood culture (routine x 2)     Status: None (Preliminary result)   Collection Time: 02/08/21 11:09 PM   Specimen: BLOOD  Result Value Ref Range Status   Specimen Description BLOOD SITE NOT SPECIFIED  Final   Special Requests   Final    BOTTLES DRAWN AEROBIC AND ANAEROBIC Blood Culture results may not be optimal due to an inadequate volume of blood received in culture bottles   Culture  Setup Time   Final    AEROBIC BOTTLE ONLY GRAM POSITIVE RODS CRITICAL RESULT CALLED TO, READ BACK BY AND VERIFIED WITH: G ABBOTT PHARMD 02/10/21 0420 JDW    Culture   Final    NO GROWTH 3 DAYS Performed at [facility], [address] [place], [place] [address]    Report Status PENDING  Incomplete         Radiology Studies: No results found.      Scheduled Meds: . apixaban  5 mg Oral BID  . aspirin  81 mg Oral q AM  . atorvastatin  80 mg Oral QPM  . fluticasone furoate-vilanterol  1 puff Inhalation Daily  . gabapentin  300 mg Oral TID  . insulin aspart  0-15 Units Subcutaneous TID AC & HS   . insulin glargine  20 Units Subcutaneous QHS  . levothyroxine  50 mcg Oral Q0600  . metoprolol tartrate  25 mg Oral BID  .  senna-docusate  2 tablet Oral BID   Continuous Infusions: . cefTRIAXone (ROCEPHIN)  IV 1 g (02/10/21 2134)     LOS: 2 days        Kathlen Mody, MD [facility]   To contact the attending provider between 7A-7P or the covering provider during after hours 7P-7A, please log into the web site [email or web address] and access using universal [place] password for that web site. If you do not have the password, please call the hospital operator.  02/11/2021, 11:13 AM

## 2021-02-12 ENCOUNTER — Inpatient Hospital Stay (HOSPITAL_COMMUNITY): Payer: Medicaid Other

## 2021-02-12 LAB — CBC WITH DIFFERENTIAL/PLATELET
Abs Immature Granulocytes: 0.03 10*3/uL (ref 0.00–0.07)
Basophils Absolute: 0 10*3/uL (ref 0.0–0.1)
Basophils Relative: 1 %
Eosinophils Absolute: 0.2 10*3/uL (ref 0.0–0.5)
Eosinophils Relative: 3 %
HCT: 35.2 % — ABNORMAL LOW (ref 36.0–46.0)
Hemoglobin: 11.6 g/dL — ABNORMAL LOW (ref 12.0–15.0)
Immature Granulocytes: 0 %
Lymphocytes Relative: 36 %
Lymphs Abs: 3.2 10*3/uL (ref 0.7–4.0)
MCH: 27.5 pg (ref 26.0–34.0)
MCHC: 33 g/dL (ref 30.0–36.0)
MCV: 83.4 fL (ref 80.0–100.0)
Monocytes Absolute: 0.8 10*3/uL (ref 0.1–1.0)
Monocytes Relative: 9 %
Neutro Abs: 4.5 10*3/uL (ref 1.7–7.7)
Neutrophils Relative %: 51 %
Platelets: 278 10*3/uL (ref 150–400)
RBC: 4.22 MIL/uL (ref 3.87–5.11)
RDW: 12.8 % (ref 11.5–15.5)
WBC: 8.9 10*3/uL (ref 4.0–10.5)
nRBC: 0 % (ref 0.0–0.2)

## 2021-02-12 LAB — BASIC METABOLIC PANEL
Anion gap: 8 (ref 5–15)
BUN: 14 mg/dL (ref 6–20)
CO2: 30 mmol/L (ref 22–32)
Calcium: 9 mg/dL (ref 8.9–10.3)
Chloride: 94 mmol/L — ABNORMAL LOW (ref 98–111)
Creatinine, Ser: 1.28 mg/dL — ABNORMAL HIGH (ref 0.44–1.00)
GFR, Estimated: 52 mL/min — ABNORMAL LOW (ref >60)
Glucose, Bld: 121 mg/dL — ABNORMAL HIGH (ref 70–99)
Potassium: 4.6 mmol/L (ref 3.5–5.1)
Sodium: 132 mmol/L — ABNORMAL LOW (ref 135–145)

## 2021-02-12 LAB — T4, FREE: Free T4: 1.1 ng/dL (ref 0.61–1.12)

## 2021-02-12 LAB — GLUCOSE, CAPILLARY
Glucose-Capillary: 119 mg/dL — ABNORMAL HIGH (ref 70–99)
Glucose-Capillary: 179 mg/dL — ABNORMAL HIGH (ref 70–99)
Glucose-Capillary: 228 mg/dL — ABNORMAL HIGH (ref 70–99)

## 2021-02-12 LAB — TSH: TSH: 9.237 u[IU]/mL — ABNORMAL HIGH (ref 0.350–4.500)

## 2021-02-12 LAB — SARS CORONAVIRUS 2 (TAT 6-24 HRS): SARS Coronavirus 2: NEGATIVE

## 2021-02-12 MED ORDER — AMOXICILLIN-POT CLAVULANATE 875-125 MG PO TABS: 1.0000 | ORAL_TABLET | Freq: Two times a day (BID) | ORAL | 0 refills | Status: AC

## 2021-02-12 MED ORDER — BASAGLAR KWIKPEN 100 UNIT/ML ~~LOC~~ SOPN: 20.0000 [IU] | PEN_INJECTOR | Freq: Every day | SUBCUTANEOUS | 3 refills | Status: DC

## 2021-02-12 MED ORDER — TRAMADOL HCL 50 MG PO TABS: 50.0000 mg | ORAL_TABLET | Freq: Two times a day (BID) | ORAL | 0 refills | Status: AC | PRN

## 2021-02-12 MED ORDER — SENNOSIDES-DOCUSATE SODIUM 8.6-50 MG PO TABS: 2.0000 | ORAL_TABLET | Freq: Two times a day (BID) | ORAL | Status: DC

## 2021-02-12 NOTE — TOC Transition Note (Signed)
Transition of Care [facility]) - CM/SW Discharge Note   Patient Details  Name: Karla Hoover MRN: [id number] Date of Birth: [birth year]  Transition of Care [facility]) CM/SW Contact:  Terrial Rhodes, LCSWA Phone Number: 02/12/2021, 11:38 AM   Clinical Narrative:     Patient will DC to: [facility]  Anticipated DC date: 02/12/2021  Family notified: Rinaldo Cloud  Transport by: Sharin Mons  ?  Per MD patient ready for DC to [facility] . RN, patient, patient's family, and facility notified of DC. Discharge Summary sent to facility. RN given number for report tele#[phone] RM# [id number]. DC packet on chart. Ambulance transport requested for patient.  CSW signing off.    Final next level of care: Skilled Nursing Facility Barriers to Discharge: No Barriers Identified   Patient Goals and CMS Choice Patient states their goals for this hospitalization and ongoing recovery are:: to go to SNF CMS Medicare.gov Compare Post Acute Care list provided to:: Patient Choice offered to / list presented to : Patient  Discharge Placement              Patient chooses bed at: [facility] Patient to be transferred to facility by: [facility] Name of family member notified: Rinaldo Cloud Patient and family notified of of transfer: 02/12/21  Discharge Plan and Services In-house Referral: Clinical Social Work                                   Social Determinants of Health (SDOH) Interventions     Readmission Risk Interventions No flowsheet data found.

## 2021-02-12 NOTE — Progress Notes (Signed)
SATURATION QUALIFICATIONS: (This note is used to comply with regulatory documentation for home oxygen)  Patient Saturations on Room Air at Rest = 92%  Patient Saturations on Room Air while Ambulating = 86%  Patient Saturations on 3 Liters of oxygen while Ambulating = 90%  Please briefly explain why patient needs home oxygen: To maintain oxygen saturations > or equal to 90% while ambulating.  Lillia Pauls, PT, DPT Acute Rehabilitation Services Pager [phone] Office [phone]

## 2021-02-12 NOTE — Progress Notes (Signed)
Physical Therapy Treatment Patient Details Name: Karla Hoover MRN: [id number] DOB: 12-13-1970 Today's Date: 02/12/2021    History of Present Illness pt is a 50 y/o female presenting 5/5  from [place]  with fevers, generalized body aches and progressive weakness thought to be due to UTI or kidney injury/infection.  PMHx: COPD, CKD3a, CRF with hypoxia, DM, HTN, PE, [place]oke with L residual weakness.    PT Comments    Pt eager to d/c back to [place]. Ambulating to bathroom and back with a quad cane at a min guard assist level. Able to perform peri care with supervision. Desaturation to 86% on RA, able to maintain 90% on 3L O2. Pt reports she will not wear oxygen when she discharges the hospital; stating, "I know when I am short of breath."    Follow Up Recommendations  SNF;Supervision - Intermittent     Equipment Recommendations  None recommended by PT    Recommendations for Other Services       Precautions / Restrictions Precautions Precautions: Fall Restrictions Weight Bearing Restrictions: No    Mobility  Bed Mobility Overal bed mobility: Needs Assistance Bed Mobility: Supine to Sit     Supine to sit: Supervision          Transfers Overall transfer level: Needs assistance Equipment used: None Transfers: Sit to/from Stand Sit to Stand: Supervision            Ambulation/Gait Ambulation/Gait assistance: Min guard Gait Distance (Feet): 30 Feet Assistive device: Quad cane Gait Pattern/deviations: Step-to pattern;Decreased stance time - left;Decreased stride length Gait velocity: decreased Gait velocity interpretation: <1.31 ft/sec, indicative of household ambulator General Gait Details: stable paretic gait with step to pattern, close min guard for safety   Stairs             Wheelchair Mobility    Modified Rankin (Stroke Patients Only)       Balance Overall balance assessment: Needs assistance Sitting-balance support: No upper extremity  supported;Feet supported Sitting balance-Leahy Scale: Good     Standing balance support: No upper extremity supported;During functional activity Standing balance-Leahy Scale: Fair                              Cognition Arousal/Alertness: Awake/alert Behavior During Therapy: WFL for tasks assessed/performed Overall Cognitive Status: Within Functional Limits for tasks assessed                                        Exercises      General Comments        Pertinent Vitals/Pain Pain Assessment: No/denies pain    Home Living                      Prior Function            PT Goals (current goals can now be found in the care plan section) Acute Rehab PT Goals Patient Stated Goal: get back to rehab PT Goal Formulation: With patient Time For Goal Achievement: 02/23/21 Potential to Achieve Goals: Good Progress towards PT goals: Progressing toward goals    Frequency    Min 2X/week      PT Plan Frequency needs to be updated    Co-evaluation              AM-PAC PT "6 Clicks" Mobility   Outcome  Measure  Help needed turning from your back to your side while in a flat bed without using bedrails?: A Little Help needed moving from lying on your back to sitting on the side of a flat bed without using bedrails?: A Little Help needed moving to and from a bed to a chair (including a wheelchair)?: A Little Help needed standing up from a chair using your arms (e.g., wheelchair or bedside chair)?: A Little Help needed to walk in hospital room?: A Little Help needed climbing 3-5 steps with a railing? : A Little 6 Click Score: 18    End of Session Equipment Utilized During Treatment: Gait belt;Oxygen Activity Tolerance: Patient tolerated treatment well Patient left: with call bell/phone within reach;in bed Nurse Communication: Mobility status PT Visit Diagnosis: Other abnormalities of gait and mobility (R26.89);Muscle weakness  (generalized) (M62.81);Difficulty in walking, not elsewhere classified (R26.2)     Time: 5374-8270 PT Time Calculation (min) (ACUTE ONLY): 30 min  Charges:  $Therapeutic Activity: 23-37 mins                     Lillia Pauls, PT, DPT Acute Rehabilitation Services Pager [phone] Office [phone]    Norval Morton 02/12/2021, 5:20 PM

## 2021-02-12 NOTE — Discharge Summary (Addendum)
Physician Discharge Summary  Karla Hoover BMS:[id number] DOB: [birth year] DOA: 02/08/2021  PCP: Patient, [name]  Admit date: 02/08/2021 Discharge date: 02/12/2021  Admitted From: SNF Disposition:  SNF  Recommendations for Outpatient Follow-up:  1. Follow up with PCP in 1-2 weeks 2. Please obtain BMP/CBC in one week 3. Please continue with [place] oxygen at 3 lit /min on ambulation to keep sats greater than 90%.   Discharge Condition: stable.  CODE STATUS: FULL CODE.  Diet recommendation: Heart Healthy  Brief/Interim Summary:  50 year old female with past medical history of COPD, chronic respiratory failure(2lpm via [place] with exertion and sleep),chronic kidney disease stage IIIa, bilateral pulmonary emboli as well as a left lower extremity DVT (11/2019 on Eliquis),right RCA stroke with left-sided weakness, nicotine dependence, hypothyroidism, insulin-dependent diabetes mellitus type 2gastroesophageal reflux disease who presents to [facility] emergency department due to fevers and generalized body aches from [place] [facility].  She was found to have sepsis from right pyelonephritis. She was started on IV antibiotics and Iv fluids. Urine cultures have been negative and blood cultures show diptherioids, probably a contaminant.   Discharge Diagnoses:  Principal Problem:   Sepsis secondary to UTI [facility]) Active Problems:   Essential hypertension   COPD with chronic bronchitis ([place])   Acute renal failure superimposed on stage 3a chronic kidney disease (HCC)   Chronic respiratory failure with hypoxia (HCC)   History of thromboembolism   Type 2 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 disease, with long-term current use of insulin (HCC)   Mixed 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 ([place])   GERD without esophagitis   Hypothyroidism   Pyelonephritis of right kidney   Sepsis secondary to right  pyelonephritis. She was started on IV Rocephin, transitioned to oral augmentin on discharge to complete the course.   Blood cultures 1/2 growing gram-positive rods likely contaminant.  No change in antibiotics. Urine cultures were negative.  CT  of the abdomen pelvis shows Right perinephric stranding correlating with history of pyelonephritis. No hydronephrosis or detected abscess. Labs this am show normal wbc count and afebrile overnight. She reports her flank pain has improved.    Essential hypertension Well-controlled blood pressure parameters  COPD with chronic bronchitis Continue with bronchodilators as needed. No wheezing heard on exam   Acute renal failure superimposed on stage IIIa CKD Creatinine back to baseline with IV fluids    Type 2 diabetes mellitus with long-term use of insulin and stage IIIa CKD CBG (last 3)  Recent Labs    02/11/21 1643 02/11/21 2106 02/12/21 0736  GLUCAP 159* 164* 119*    Uncontrolled with hyperglycemia Increase Lantus to 20 units daily and continue with moderate SSI. Continue with gabapentin for the diabetic neuropathy. Hemoglobin A1c greater than 9    History of thromboembolic disease. Continue with Eliquis 5 mg twice daily.   History of stroke with left-sided residual weakness, bedbound Therapy evaluation recommending SNF. Continue with Eliquis and aspirin and Lipitor.     Hyperlipidemia Continue with Lipitor 80 mg daily.    Hypothyroidism Continue with Synthroid 50 MCG daily. Elevated TSH, follow up with thyroid panel in 2 to 4 weeks.    GERD  Stable.   Constipation:  Continue with senna and colace and miralax.    Hyponatremia:  improved with hydration.    Hypoxia:  Probably secondary to atelectasis and bronchitis, . Differential include Deconditioning vs PE vs pneumonia.   CXR does not show infiltrate  or pneumonia. Ordered oxygen on discharge to keep sats greater than 90%.  Patient is  already on anti coagulation , eliquis 5 mg BID.  Recommend incentive spirometer.       Discharge Instructions  Discharge Instructions    Diet - low sodium heart healthy   Complete by: As directed    Discharge instructions   Complete by: As directed    Please follow up with PCp in one week before the antibiotics are completed.     Allergies as of 02/12/2021      Reactions   Guaifenesin Anaphylaxis   Kiwi Extract Anaphylaxis, Rash   Levaquin [levofloxacin In D5w] Shortness Of Breath, Rash   Strawberry Extract Anaphylaxis, Rash   Baclofen Other (See Comments)   Near syncope/ fall      Medication List    STOP taking these medications   ALPRAZolam 0.5 MG tablet Commonly known as: XANAX   buPROPion 100 MG 12 hr tablet Commonly known as: WELLBUTRIN SR   clopidogrel 75 MG tablet Commonly known as: PLAVIX   ibuprofen 800 MG tablet Commonly known as: ADVIL   insulin lispro 100 UNIT/ML KwikPen Commonly known as: HumaLOG KwikPen   ipratropium-albuterol 0.5-2.5 (3) MG/3ML Soln Commonly known as: DUONEB   lidocaine 5 % Commonly known as: LIDODERM   methylphenidate 5 MG tablet Commonly known as: RITALIN   mirtazapine 7.5 MG tablet Commonly known as: REMERON   Narcan 4 MG/0.1ML Liqd nasal spray kit Generic drug: naloxone   nicotine 21 mg/24hr patch Commonly known as: Nicoderm CQ   omeprazole 20 MG capsule Commonly known as: PRILOSEC   oxyCODONE 5 MG immediate release tablet Commonly known as: Oxy IR/ROXICODONE   Pen Needles 32G X 4 MM Misc   tetrahydrozoline 0.05 % ophthalmic solution   Trelegy Ellipta 100-62.5-25 MCG/INH Aepb Generic drug: Fluticasone-Umeclidin-Vilant     TAKE these medications   albuterol 108 (90 Base) MCG/ACT inhaler Commonly known as: VENTOLIN HFA Inhale 2 puffs into the lungs every 6 (six) hours as needed for wheezing or shortness of breath. What changed: reasons to take this   amoxicillin-clavulanate 875-125 MG tablet Commonly  known as: Augmentin Take 1 tablet by mouth every 12 (twelve) hours for 6 days.   apixaban 5 MG Tabs tablet Commonly known as: Eliquis Take 2 tablets ($RemoveBe'10mg'VAHnTtSxe$ ) twice daily for 7 days, then 1 tablet ($RemoveB'5mg'diqtDapf$ ) twice daily What changed:   how much to take  how to take this  when to take this  additional instructions   aspirin 81 MG chewable tablet Chew 81 mg by mouth in the morning.   atorvastatin 80 MG tablet Commonly known as: LIPITOR Take 1 tablet (80 mg total) by mouth daily. What changed: when to take this   Basaglar KwikPen 100 UNIT/ML Inject 20 Units into the skin daily. What changed: how much to take   Cozaar 50 MG tablet Generic drug: losartan Take 50 mg by mouth daily.   cyclobenzaprine 10 MG tablet Commonly known as: FLEXERIL Take 1 tablet (10 mg total) by mouth 3 (three) times daily as needed for muscle spasms.   docusate sodium 100 MG capsule Commonly known as: COLACE Take 100 mg by mouth 2 (two) times daily as needed for constipation.   gabapentin 300 MG capsule Commonly known as: NEURONTIN Take 1 capsule (300 mg total) by mouth 3 (three) times daily.   glimepiride 2 MG tablet Commonly known as: AMARYL Take 2 mg by mouth daily with breakfast.   metoprolol tartrate 25 MG tablet  Commonly known as: LOPRESSOR Take 1 tablet (25 mg total) by mouth 2 (two) times daily.   polyethylene glycol 17 g packet Commonly known as: MIRALAX / GLYCOLAX Take 17 g by mouth 2 (two) times daily as needed.   senna-docusate 8.6-50 MG tablet Commonly known as: Senokot-S Take 2 tablets by mouth 2 (two) times daily.   Symbicort 80-4.5 MCG/ACT inhaler Generic drug: budesonide-formoterol Inhale 2 puffs into the lungs 2 (two) times daily.   Synthroid 50 MCG tablet Generic drug: levothyroxine Take 50 mcg by mouth daily before breakfast. What changed: Another medication with the same name was removed. Continue taking this medication, and follow the directions you see here.        Follow-up Information    primary care physician Follow up in 1 week(s).              Allergies  Allergen Reactions  . Guaifenesin Anaphylaxis  . Kiwi Extract Anaphylaxis and Rash  . Levaquin [Levofloxacin In D5w] Shortness Of Breath and Rash  . Strawberry Extract Anaphylaxis and Rash  . Baclofen Other (See Comments)    Near syncope/ fall    Consultations:  None.    Procedures/Studies: CT ABDOMEN PELVIS WO CONTRAST  Result Date: 02/09/2021 CLINICAL DATA:  Pyelonephritis.  Uncomplicated sepsis. EXAM: CT ABDOMEN AND PELVIS WITHOUT CONTRAST TECHNIQUE: Multidetector CT imaging of the abdomen and pelvis was performed following the standard protocol without IV contrast. COMPARISON:  07/08/2020 FINDINGS: Incomplete coverage of the extreme upper abdomen. Lower chest:  No contributory findings. Hepatobiliary: No focal liver abnormality.No evidence of biliary obstruction or stone. Pancreas: Unremarkable. Spleen: Unremarkable. Adrenals/Urinary Tract: Negative adrenals. Right perinephric stranding which may relate to the history of pyelonephritis. There is right more than left renal cortical thinning and lobulation from scarring. Right upper pole cyst by prior enhanced CT, measuring 22 mm. 3 mm right renal calculus. No hydronephrosis. Unremarkable bladder. Stomach/Bowel:  No obstruction. No appendicitis. Vascular/Lymphatic: No acute vascular abnormality. No mass or adenopathy. Reproductive:Deviated uterus towards the right, unchanged and without endometrial dysmorphism on prior enhanced study. Other: No ascites or pneumoperitoneum. Musculoskeletal: No acute abnormalities. IMPRESSION: 1. Right perinephric stranding correlating with history of pyelonephritis. No hydronephrosis or detected abscess. A right upper pole renal cyst remain simple in density. 2. Right more than left renal scarring. 3. 3 mm right renal calculus. Electronically Signed   By: Monte Fantasia M.D.   On: 02/09/2021 04:38   DG  Chest Portable 1 View  Result Date: 02/08/2021 CLINICAL DATA:  Fever EXAM: PORTABLE CHEST 1 VIEW COMPARISON:  11/15/2019 FINDINGS: The heart size and mediastinal contours are within normal limits. Both lungs are clear. The visualized skeletal structures are unremarkable. IMPRESSION: No active disease. Electronically Signed   By: Donavan Foil M.D.   On: 02/08/2021 22:50       Subjective: No new complaints.   Discharge Exam: Vitals:   02/12/21 0407 02/12/21 0746  BP: 122/72   Pulse: 79 73  Resp: 15 16  Temp: 99.1 F (37.3 C)   SpO2: 98% 91%   Vitals:   02/11/21 1648 02/11/21 2025 02/12/21 0407 02/12/21 0746  BP: (!) 124/113 (!) 141/84 122/72   Pulse:  81 79 73  Resp: $Remo'18 18 15 16  'sSXlx$ Temp: 98.7 F (37.1 C) 99 F (37.2 C) 99.1 F (37.3 C)   TempSrc:  Oral Oral   SpO2:   98% 91%  Weight:      Height:        General: Pt is  alert, awake, not in acute distress Cardiovascular: RRR, S1/S2 +, no rubs, no gallops Respiratory: CTA bilaterally, no wheezing, no rhonchi Abdominal: Soft, NT, ND, bowel sounds + Extremities: no edema, no cyanosis    The results of significant diagnostics from this hospitalization (including imaging, microbiology, ancillary and laboratory) are listed below for reference.     Microbiology: Recent Results (from the past 240 hour(s))  Urine culture     Status: None   Collection Time: 02/08/21  8:42 PM   Specimen: Urine, Catheterized  Result Value Ref Range Status   Specimen Description URINE, CATHETERIZED  Final   Special Requests NONE  Final   Culture   Final    NO GROWTH Performed at [facility], [address] [place], [place] [address]    Report Status 02/10/2021 FINAL  Final  SARS CORONAVIRUS 2 (TAT 6-24 HRS) Nasopharyngeal Nasopharyngeal Swab     Status: None   Collection Time: 02/08/21  9:21 PM   Specimen: Nasopharyngeal Swab  Result Value Ref Range Status   SARS Coronavirus 2 NEGATIVE NEGATIVE Final    Comment:  (NOTE) SARS-CoV-2 target nucleic acids are NOT DETECTED.  The SARS-CoV-2 RNA is generally detectable in upper and lower respiratory specimens during the acute phase of infection. Negative results do not preclude SARS-CoV-2 infection, do not rule out co-infections with other pathogens, and should not be used as the sole basis for treatment or other patient management decisions. Negative results must be combined with clinical observations, patient history, and epidemiological information. The expected result is Negative.  Fact Sheet for Patients: SugarRoll.be  Fact Sheet for Healthcare Providers: https://www.woods-mathews.com/  This test is not yet approved or cleared by the [place] FDA and  has been authorized for detection and/or diagnosis of SARS-CoV-2 by FDA under an Emergency Use Authorization (EUA). This EUA will remain  in effect (meaning this test can be used) for the duration of the COVID-19 declaration under Se ction 564(b)(1) of the Act, 21 U.S.C. section 360bbb-3(b)(1), unless the authorization is terminated or revoked sooner.  Performed at [facility], [place] [address], [place],  [address]   Blood culture (routine x 2)     Status: None (Preliminary result)   Collection Time: 02/08/21 11:07 PM   Specimen: BLOOD  Result Value Ref Range Status   Specimen Description BLOOD SITE NOT SPECIFIED  Final   Special Requests   Final    BOTTLES DRAWN AEROBIC AND ANAEROBIC Blood Culture results may not be optimal due to an inadequate volume of blood received in culture bottles   Culture   Final    NO GROWTH 3 DAYS Performed at [facility], [place] [address] [place],  [address]    Report Status PENDING  Incomplete  Blood culture (routine x 2)     Status: Abnormal   Collection Time: 02/08/21 11:09 PM   Specimen: BLOOD  Result Value Ref Range Status   Specimen Description BLOOD SITE NOT SPECIFIED  Final    Special Requests   Final    BOTTLES DRAWN AEROBIC AND ANAEROBIC Blood Culture results may not be optimal due to an inadequate volume of blood received in culture bottles   Culture  Setup Time   Final    AEROBIC BOTTLE ONLY GRAM POSITIVE RODS CRITICAL RESULT CALLED TO, READ BACK BY AND VERIFIED WITH: G ABBOTT PHARMD 02/10/21 0420 JDW    Culture (A)  Final    DIPHTHEROIDS(CORYNEBACTERIUM SPECIES) Standardized susceptibility testing for this organism  is not available. Performed at [facility], [place] [address] [place], [place] [address]    Report Status 02/11/2021 FINAL  Final  SARS CORONAVIRUS 2 (TAT 6-24 HRS) Nasopharyngeal Nasopharyngeal Swab     Status: None   Collection Time: 02/11/21  8:20 PM   Specimen: Nasopharyngeal Swab  Result Value Ref Range Status   SARS Coronavirus 2 NEGATIVE NEGATIVE Final    Comment: (NOTE) SARS-CoV-2 target nucleic acids are NOT DETECTED.  The SARS-CoV-2 RNA is generally detectable in upper and lower respiratory specimens during the acute phase of infection. Negative results do not preclude SARS-CoV-2 infection, do not rule out co-infections with other pathogens, and should not be used as the sole basis for treatment or other patient management decisions. Negative results must be combined with clinical observations, patient history, and epidemiological information. The expected result is Negative.  Fact Sheet for Patients: SugarRoll.be  Fact Sheet for Healthcare Providers: https://www.woods-mathews.com/  This test is not yet approved or cleared by the [place] FDA and  has been authorized for detection and/or diagnosis of SARS-CoV-2 by FDA under an Emergency Use Authorization (EUA). This EUA will remain  in effect (meaning this test can be used) for the duration of the COVID-19 declaration under Se ction 564(b)(1) of the Act, 21 U.S.C. section 360bbb-3(b)(1), unless the authorization is terminated  or revoked sooner.  Performed at [facility], [place] [address] [place], [place] [address]      Labs: BNP (last 3 results) No results for input(s): BNP in the last 8760 hours. Basic Metabolic Panel: Recent Labs  Lab 02/08/21 2042 02/09/21 0236 02/11/21 0111 02/11/21 1130 02/12/21 0126  NA 130* 132* 135  --  132*  K 4.2 4.1 4.4  --  4.6  CL 95* 95* 97*  --  94*  CO2 $Re'25 27 31  'drg$ --  30  GLUCOSE 240* 204* 109*  --  121*  BUN 25* 23* 16  --  14  CREATININE 1.74* 1.63* 1.32*  --  1.28*  CALCIUM 9.0 9.1 9.0  --  9.0  MG  --  1.7  --  2.6*  --    Liver Function Tests: Recent Labs  Lab 02/08/21 2042 02/09/21 0236  AST 22 20  ALT 17 15  ALKPHOS 102 103  BILITOT 0.8 1.0  PROT 7.0 7.3  ALBUMIN 3.1* 2.9*   No results for input(s): LIPASE, AMYLASE in the last 168 hours. No results for input(s): AMMONIA in the last 168 hours. CBC: Recent Labs  Lab 02/08/21 2042 02/09/21 0236 02/11/21 0111 02/12/21 0126  WBC 16.7* 13.7* 8.9 8.9  NEUTROABS 12.7* 10.2* 4.4 4.5  HGB 12.7 12.7 11.5* 11.6*  HCT 39.4 39.4 36.2 35.2*  MCV 85.3 85.1 85.6 83.4  PLT 247 198 242 278   Cardiac Enzymes: No results for input(s): CKTOTAL, CKMB, CKMBINDEX, TROPONINI in the last 168 hours. BNP: Invalid input(s): POCBNP CBG: Recent Labs  Lab 02/11/21 0759 02/11/21 1233 02/11/21 1643 02/11/21 2106 02/12/21 0736  GLUCAP 137* 184* 159* 164* 119*   D-Dimer No results for input(s): DDIMER in the last 72 hours. Hgb A1c No results for input(s): HGBA1C in the last 72 hours. Lipid Profile No results for input(s): CHOL, HDL, LDLCALC, TRIG, CHOLHDL, LDLDIRECT in the last 72 hours. Thyroid function studies Recent Labs    02/12/21 0126  TSH 9.237*   Anemia work up No results for input(s): VITAMINB12, FOLATE, FERRITIN, TIBC, IRON, RETICCTPCT in the last 72 hours. Urinalysis  Component Value Date/Time   COLORURINE AMBER (A) 02/08/2021 2042   APPEARANCEUR CLOUDY (A) 02/08/2021 2042    APPEARANCEUR Clear 11/24/2012 1238   LABSPEC 1.019 02/08/2021 2042   LABSPEC 1.000 11/24/2012 1238   PHURINE 5.0 02/08/2021 2042   GLUCOSEU NEGATIVE 02/08/2021 2042   GLUCOSEU >=500 11/24/2012 1238   HGBUR LARGE (A) 02/08/2021 2042   BILIRUBINUR NEGATIVE 02/08/2021 2042   BILIRUBINUR Negative 11/24/2012 [place] 02/08/2021 2042   PROTEINUR 100 (A) 02/08/2021 2042   UROBILINOGEN 1.0 04/30/2010 0902   NITRITE NEGATIVE 02/08/2021 2042   LEUKOCYTESUR LARGE (A) 02/08/2021 2042   LEUKOCYTESUR Negative 11/24/2012 1238   Sepsis Labs Invalid input(s): PROCALCITONIN,  WBC,  LACTICIDVEN Microbiology Recent Results (from the past 240 hour(s))  Urine culture     Status: None   Collection Time: 02/08/21  8:42 PM   Specimen: Urine, Catheterized  Result Value Ref Range Status   Specimen Description URINE, CATHETERIZED  Final   Special Requests NONE  Final   Culture   Final    NO GROWTH Performed at [place] [address] [place], [place] [address]    Report Status 02/10/2021 FINAL  Final  SARS CORONAVIRUS 2 (TAT 6-24 HRS) Nasopharyngeal Nasopharyngeal Swab     Status: None   Collection Time: 02/08/21  9:21 PM   Specimen: Nasopharyngeal Swab  Result Value Ref Range Status   SARS Coronavirus 2 NEGATIVE NEGATIVE Final    Comment: (NOTE) SARS-CoV-2 target nucleic acids are NOT DETECTED.  The SARS-CoV-2 RNA is generally detectable in upper and lower respiratory specimens during the acute phase of infection. Negative results do not preclude SARS-CoV-2 infection, do not rule out co-infections with other pathogens, and should not be used as the sole basis for treatment or other patient management decisions. Negative results must be combined with clinical observations, patient history, and epidemiological information. The expected result is Negative.  Fact Sheet for Patients: SugarRoll.be  Fact Sheet for Healthcare  Providers: https://www.woods-mathews.com/  This test is not yet approved or cleared by the [place] FDA and  has been authorized for detection and/or diagnosis of SARS-CoV-2 by FDA under an Emergency Use Authorization (EUA). This EUA will remain  in effect (meaning this test can be used) for the duration of the COVID-19 declaration under Se ction 564(b)(1) of the Act, 21 U.S.C. section 360bbb-3(b)(1), unless the authorization is terminated or revoked sooner.  Performed at [facility], [place] [address], [place], [place] [address]   Blood culture (routine x 2)     Status: None (Preliminary result)   Collection Time: 02/08/21 11:07 PM   Specimen: BLOOD  Result Value Ref Range Status   Specimen Description BLOOD SITE NOT SPECIFIED  Final   Special Requests   Final    BOTTLES DRAWN AEROBIC AND ANAEROBIC Blood Culture results may not be optimal due to an inadequate volume of blood received in culture bottles   Culture   Final    NO GROWTH 3 DAYS Performed at [facility], [place] [address], [place], [place] [address]    Report Status PENDING  Incomplete  Blood culture (routine x 2)     Status: Abnormal   Collection Time: 02/08/21 11:09 PM   Specimen: BLOOD  Result Value Ref Range Status   Specimen Description BLOOD SITE NOT SPECIFIED  Final   Special Requests   Final    BOTTLES DRAWN AEROBIC AND ANAEROBIC Blood Culture results may not be optimal due  to an inadequate volume of blood received in culture bottles   Culture  Setup Time   Final    AEROBIC BOTTLE ONLY GRAM POSITIVE RODS CRITICAL RESULT CALLED TO, READ BACK BY AND VERIFIED WITH: G ABBOTT PHARMD 02/10/21 0420 JDW    Culture (A)  Final    DIPHTHEROIDS(CORYNEBACTERIUM SPECIES) Standardized susceptibility testing for this organism is not available. Performed at [facility], [place] [address] [place], [place] [address]    Report Status 02/11/2021 FINAL  Final  SARS CORONAVIRUS 2 (TAT 6-24 HRS)  Nasopharyngeal Nasopharyngeal Swab     Status: None   Collection Time: 02/11/21  8:20 PM   Specimen: Nasopharyngeal Swab  Result Value Ref Range Status   SARS Coronavirus 2 NEGATIVE NEGATIVE Final    Comment: (NOTE) SARS-CoV-2 target nucleic acids are NOT DETECTED.  The SARS-CoV-2 RNA is generally detectable in upper and lower respiratory specimens during the acute phase of infection. Negative results do not preclude SARS-CoV-2 infection, do not rule out co-infections with other pathogens, and should not be used as the sole basis for treatment or other patient management decisions. Negative results must be combined with clinical observations, patient history, and epidemiological information. The expected result is Negative.  Fact Sheet for Patients: SugarRoll.be  Fact Sheet for Healthcare Providers: https://www.woods-mathews.com/  This test is not yet approved or cleared by the [place] FDA and  has been authorized for detection and/or diagnosis of SARS-CoV-2 by FDA under an Emergency Use Authorization (EUA). This EUA will remain  in effect (meaning this test can be used) for the duration of the COVID-19 declaration under Se ction 564(b)(1) of the Act, 21 U.S.C. section 360bbb-3(b)(1), unless the authorization is terminated or revoked sooner.  Performed at [facility], [place] [address] [place], [place] [address]      Time coordinating discharge: 36 minutes.   SIGNED:   Hosie Poisson, MD  [facility] 02/12/2021, 8:19 AM

## 2021-02-13 ENCOUNTER — Other Ambulatory Visit: Payer: Self-pay

## 2021-02-13 LAB — CULTURE, BLOOD (ROUTINE X 2): Culture: NO GROWTH

## 2021-02-13 LAB — T3, FREE: T3, Free: 2.8 pg/mL (ref 2.0–4.4)

## 2021-03-10 ENCOUNTER — Encounter (HOSPITAL_COMMUNITY): Payer: Self-pay

## 2021-03-10 ENCOUNTER — Emergency Department (HOSPITAL_COMMUNITY)
Admission: EM | Admit: 2021-03-10 | Discharge: 2021-03-10 | Disposition: A | Discharge status: Home / Self Care | Payer: Medicaid Other | Attending: Emergency Medicine | Admitting: Emergency Medicine

## 2021-03-10 ENCOUNTER — Emergency Department (HOSPITAL_COMMUNITY): Payer: Medicaid Other

## 2021-03-10 ENCOUNTER — Other Ambulatory Visit: Payer: Self-pay

## 2021-03-10 DIAGNOSIS — R1084 Generalized abdominal pain: Secondary | ICD-10-CM | POA: Insufficient documentation

## 2021-03-10 DIAGNOSIS — Z7984 Long term (current) use of oral hypoglycemic drugs: Secondary | ICD-10-CM | POA: Diagnosis not present

## 2021-03-10 DIAGNOSIS — Z7901 Long term (current) use of anticoagulants: Secondary | ICD-10-CM | POA: Insufficient documentation

## 2021-03-10 DIAGNOSIS — E039 Hypothyroidism, unspecified: Secondary | ICD-10-CM | POA: Diagnosis not present

## 2021-03-10 DIAGNOSIS — Z794 Long term (current) use of insulin: Secondary | ICD-10-CM | POA: Insufficient documentation

## 2021-03-10 DIAGNOSIS — R197 Diarrhea, unspecified: Secondary | ICD-10-CM | POA: Diagnosis not present

## 2021-03-10 DIAGNOSIS — E1122 Type 2 diabetes mellitus with diabetic chronic kidney disease: Secondary | ICD-10-CM | POA: Insufficient documentation

## 2021-03-10 DIAGNOSIS — Z7982 Long term (current) use of aspirin: Secondary | ICD-10-CM | POA: Diagnosis not present

## 2021-03-10 DIAGNOSIS — Z87891 Personal history of nicotine dependence: Secondary | ICD-10-CM | POA: Diagnosis not present

## 2021-03-10 DIAGNOSIS — Z20822 Contact with and (suspected) exposure to covid-19: Secondary | ICD-10-CM | POA: Insufficient documentation

## 2021-03-10 DIAGNOSIS — N1831 Chronic kidney disease, stage 3a: Secondary | ICD-10-CM | POA: Diagnosis not present

## 2021-03-10 DIAGNOSIS — R112 Nausea with vomiting, unspecified: Secondary | ICD-10-CM | POA: Diagnosis present

## 2021-03-10 DIAGNOSIS — I129 Hypertensive chronic kidney disease with stage 1 through stage 4 chronic kidney disease, or unspecified chronic kidney disease: Secondary | ICD-10-CM | POA: Insufficient documentation

## 2021-03-10 DIAGNOSIS — J45909 Unspecified asthma, uncomplicated: Secondary | ICD-10-CM | POA: Diagnosis not present

## 2021-03-10 DIAGNOSIS — J449 Chronic obstructive pulmonary disease, unspecified: Secondary | ICD-10-CM | POA: Insufficient documentation

## 2021-03-10 DIAGNOSIS — Z79899 Other long term (current) drug therapy: Secondary | ICD-10-CM | POA: Insufficient documentation

## 2021-03-10 LAB — COMPREHENSIVE METABOLIC PANEL
ALT: 9 U/L (ref 0–44)
AST: 14 U/L — ABNORMAL LOW (ref 15–41)
Albumin: 3.4 g/dL — ABNORMAL LOW (ref 3.5–5.0)
Alkaline Phosphatase: 96 U/L (ref 38–126)
Anion gap: 11 (ref 5–15)
BUN: 27 mg/dL — ABNORMAL HIGH (ref 6–20)
CO2: 22 mmol/L (ref 22–32)
Calcium: 9.2 mg/dL (ref 8.9–10.3)
Chloride: 100 mmol/L (ref 98–111)
Creatinine, Ser: 1.45 mg/dL — ABNORMAL HIGH (ref 0.44–1.00)
GFR, Estimated: 44 mL/min — ABNORMAL LOW (ref >60)
Glucose, Bld: 240 mg/dL — ABNORMAL HIGH (ref 70–99)
Potassium: 4 mmol/L (ref 3.5–5.1)
Sodium: 133 mmol/L — ABNORMAL LOW (ref 135–145)
Total Bilirubin: 0.7 mg/dL (ref 0.3–1.2)
Total Protein: 7.1 g/dL (ref 6.5–8.1)

## 2021-03-10 LAB — CBC WITH DIFFERENTIAL/PLATELET
Abs Immature Granulocytes: 0.05 10*3/uL (ref 0.00–0.07)
Basophils Absolute: 0 10*3/uL (ref 0.0–0.1)
Basophils Relative: 0 %
Eosinophils Absolute: 0.4 10*3/uL (ref 0.0–0.5)
Eosinophils Relative: 4 %
HCT: 35.1 % — ABNORMAL LOW (ref 36.0–46.0)
Hemoglobin: 11.2 g/dL — ABNORMAL LOW (ref 12.0–15.0)
Immature Granulocytes: 1 %
Lymphocytes Relative: 37 %
Lymphs Abs: 4 10*3/uL (ref 0.7–4.0)
MCH: 27.3 pg (ref 26.0–34.0)
MCHC: 31.9 g/dL (ref 30.0–36.0)
MCV: 85.6 fL (ref 80.0–100.0)
Monocytes Absolute: 0.7 10*3/uL (ref 0.1–1.0)
Monocytes Relative: 6 %
Neutro Abs: 5.6 10*3/uL (ref 1.7–7.7)
Neutrophils Relative %: 52 %
Platelets: 295 10*3/uL (ref 150–400)
RBC: 4.1 MIL/uL (ref 3.87–5.11)
RDW: 13.2 % (ref 11.5–15.5)
WBC: 10.7 10*3/uL — ABNORMAL HIGH (ref 4.0–10.5)
nRBC: 0 % (ref 0.0–0.2)

## 2021-03-10 LAB — RESP PANEL BY RT-PCR (FLU A&B, COVID) ARPGX2
Influenza A by PCR: NEGATIVE
Influenza B by PCR: NEGATIVE
SARS Coronavirus 2 by RT PCR: NEGATIVE

## 2021-03-10 MED ORDER — FENTANYL CITRATE (PF) 100 MCG/2ML IJ SOLN: 50.0000 ug | Freq: Once | INTRAMUSCULAR | Status: DC

## 2021-03-10 MED ORDER — DICYCLOMINE HCL 10 MG/ML IM SOLN
20.0000 mg | Freq: Once | INTRAMUSCULAR | Status: AC
  Administered 2021-03-10: 20 mg via INTRAMUSCULAR
  Filled 2021-03-10: qty 2

## 2021-03-10 MED ORDER — PROMETHAZINE HCL 25 MG PO TABS: 25.0000 mg | ORAL_TABLET | Freq: Four times a day (QID) | ORAL | 0 refills | Status: DC | PRN

## 2021-03-10 MED ORDER — DICYCLOMINE HCL 20 MG PO TABS: 20.0000 mg | ORAL_TABLET | Freq: Three times a day (TID) | ORAL | 0 refills | Status: DC | PRN

## 2021-03-10 MED ORDER — MORPHINE SULFATE (PF) 4 MG/ML IV SOLN
4.0000 mg | Freq: Once | INTRAVENOUS | Status: AC
Start: 2021-03-10 — End: 2021-03-10
  Administered 2021-03-10: 4 mg via INTRAVENOUS
  Filled 2021-03-10: qty 1

## 2021-03-10 MED ORDER — SODIUM CHLORIDE 0.9 % IV BOLUS
1000.0000 mL | Freq: Once | INTRAVENOUS | Status: AC
  Administered 2021-03-10: 1000 mL via INTRAVENOUS

## 2021-03-10 MED ORDER — AZITHROMYCIN 250 MG PO TABS: 250.0000 mg | ORAL_TABLET | Freq: Every day | ORAL | 0 refills | Status: DC

## 2021-03-10 NOTE — ED Notes (Signed)
Pt mother Randa Evens called for update on POC per request. Informed her she was being discharged back to facility

## 2021-03-10 NOTE — ED Notes (Signed)
[facility] Called for transport 

## 2021-03-10 NOTE — ED Notes (Signed)
[facility] called to get an update, patient is on the list but it may be a while before [facility] can get here to pick up patient

## 2021-03-10 NOTE — ED Provider Notes (Signed)
  Physical Exam  BP 114/71   Pulse 66   Temp 98.7 F (37.1 C) (Oral)   Resp 13   Ht 5\' 9"  (1.753 m)   Wt 86.6 kg   LMP 10/20/2019   SpO2 96%   BMI 28.21 kg/m   Physical Exam  ED Course/Procedures     Procedures  MDM   Received care of patient from Dr. 10/22/2019.  Please see his note for prior history, physical and care.  Briefly this is a 50 year old female who [facility] who presents with 10 days of diarrhea, nausea, vomiting and abdominal pain.  CT is pending.  Labs show stable hemoglobin, mild leukocytosis of 10.7, creatinine stable with prior values in the range of 1-1.74, with level of 1.45 today.  No other significant electrolyte abnormalities.  CT showed no acute or inflammatory process, resolved right pyelonephritis.  She received a liter of fluid with Dr. 04-01-1980.  She was laying on her side overnight and had some blood pressure readings that appear inaccurate, and when at bedside checking blood pressures they are 106-110s systolic.  She has not had any diarrhea since she has been in the emergency department.  Given duration of symptoms and severity of diarrhea described, do feel is reasonable to give empiric antibiotics. Given rx for azithromycin, phenergan and bentyl.  She did have cdiff ordered at facillty but called facility and no results, and not able to send here.   She is stable for continued oral rehydration and symptom control at the facility. Patient discharged in stable condition with understanding of reasons to return.        Gerald Leitz, MD 03/10/21 0930

## 2021-03-10 NOTE — ED Notes (Signed)
Pt transported to CT ?

## 2021-03-10 NOTE — ED Provider Notes (Signed)
[facility] EMERGENCY DEPARTMENT Provider Note   CSN: [id number] Arrival date & time: 03/10/21  0300     History Chief Complaint  Patient presents with  . Nausea  . Emesis  . Diarrhea  . Abdominal Pain    Karla Hoover is a 50 y.o. female.  Patient reports that she has been having nausea, vomiting, poor oral intake, profuse diarrhea, diffuse abdominal pain for 10 days.  Pain goes across her entire abdomen and is of burning sensation.  She has not been able to eat or drink because of the nausea and vomiting.  She cannot control her diarrhea.  No rectal bleeding.  No hematemesis.        Past Medical History:  Diagnosis Date  . Asthma   . Chronic kidney disease, stage 3a (HCC) 02/09/2021  . Chronic respiratory failure with hypoxia (HCC) 02/09/2021  . COPD (chronic obstructive pulmonary disease) (HCC)   . 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 Diabetes mellitus without complication (HCC)   . Essential hypertension 05/04/2009   Qualifier: Diagnosis of  By: Roxan Hockey CMA, Shanda Bumps    . GERD without esophagitis 02/09/2021  . Hypertension   . Hypothyroidism 02/09/2021  . Mixed 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 (HCC) 02/09/2021  . Pulmonary embolism (HCC) 11/16/2019  . Stroke (HCC)   . Type 2 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 disease, with long-term current use of insulin (HCC) 02/09/2021    Patient Active Problem List   Diagnosis Date Noted  . Acute renal failure superimposed on stage 3a chronic kidney disease (HCC) 02/09/2021  . Chronic respiratory failure with hypoxia (HCC) 02/09/2021  . History of thromboembolism 02/09/2021  . Type 2 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 disease, with long-term current use of insulin (HCC) 02/09/2021  . Mixed 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 (HCC) 02/09/2021  . GERD without esophagitis  02/09/2021  . Hypothyroidism 02/09/2021  . Pyelonephritis of right kidney 02/09/2021  . Sepsis secondary to UTI (HCC) 02/08/2021  . Lymphadenopathy 12/28/2020  . VTE (venous thromboembolism) 12/01/2019  . Overweight (BMI 25.0-29.9) 11/22/2019  . Chronic constipation 11/22/2019  . Tobacco abuse 11/16/2019  . Elevated troponin 11/16/2019  . Bilateral pulmonary embolism (HCC) 11/15/2019  . DM (diabetes mellitus), type 2 (HCC) 11/15/2019  . History of stroke 11/15/2019  . Essential hypertension 05/04/2009  . 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 No pertinent surgical history.   OB History   No obstetric history on file.     Family History  Problem Relation Age of Onset  . Diabetes Mother   . COPD Sister   . Heart failure Sister     Social History   Tobacco Use  . Smoking status: Former Smoker    Packs/day: 1.75    Years: 36.00    Pack years: 63.00    Types: Cigarettes    Start date: 10/08/1983    Quit date: 11/22/2019    Years since quitting: 1.2  . Smokeless tobacco: Never Used  Vaping Use  . Vaping Use: Every day  . Substances: Nicotine  Substance Use Topics  . Alcohol use: No  . Drug use: No    Home Medications Prior to Admission medications   Medication Sig Start Date End Date Taking? Authorizing Provider  albuterol (VENTOLIN HFA) 108 (90 Base) MCG/ACT inhaler Inhale 2 puffs into the lungs every  6 (six) hours as needed for wheezing or shortness of breath. Patient taking differently: Inhale 2 puffs into the lungs every 6 (six) hours as needed for wheezing or shortness of breath ("COPD"). 10/04/19   Grayce Sessions, NP  apixaban (ELIQUIS) 5 MG TABS tablet Take 2 tablets (10mg ) twice daily for 7 days, then 1 tablet (5mg ) twice daily Patient taking differently: Take 5 mg by mouth every 12 (twelve) hours. 11/21/19   , DO  aspirin 81 MG chewable tablet Chew 81 mg by mouth in the morning.    [provider]   atorvastatin (LIPITOR) 80 MG tablet Take 1 tablet (80 mg total) by mouth daily. Patient taking differently: Take 80 mg by mouth every evening. 10/04/19   Darlin Drop, NP  COZAAR 50 MG tablet Take 50 mg by mouth daily.    [provider]  cyclobenzaprine (FLEXERIL) 10 MG tablet Take 1 tablet (10 mg total) by mouth 3 (three) times daily as needed for muscle spasms. 10/04/19   Grayce Sessions, NP  docusate sodium (COLACE) 100 MG capsule Take 100 mg by mouth 2 (two) times daily as needed for constipation. 08/23/19   [provider]  gabapentin (NEURONTIN) 300 MG capsule Take 1 capsule (300 mg total) by mouth 3 (three) times daily. 11/11/19 12/11/19  01/09/20, NP  glimepiride (AMARYL) 2 MG tablet Take 2 mg by mouth daily with breakfast.    [provider]  Insulin Glargine (BASAGLAR KWIKPEN) 100 UNIT/ML Inject 20 Units into the skin daily. 02/12/21   Grayce Sessions, MD  metoprolol tartrate (LOPRESSOR) 25 MG tablet Take 1 tablet (25 mg total) by mouth 2 (two) times daily. 10/04/19   Kathlen Mody, NP  polyethylene glycol (MIRALAX / GLYCOLAX) 17 g packet Take 17 g by mouth 2 (two) times daily as needed. Patient not taking: Reported on 02/08/2021 09/09/19   04/10/2021, PA-C  senna-docusate (SENOKOT-S) 8.6-50 MG tablet Take 2 tablets by mouth 2 (two) times daily. 02/12/21   01-13[birth year]1979, MD  SYMBICORT 80-4.5 MCG/ACT inhaler Inhale 2 puffs into the lungs 2 (two) times daily.    [provider]  SYNTHROID 50 MCG tablet Take 50 mcg by mouth daily before breakfast.    [provider]    Allergies    Guaifenesin, Kiwi extract, Levaquin [levofloxacin in d5w], Strawberry extract, and Baclofen  Review of Systems   Review of Systems  Gastrointestinal: Positive for abdominal pain, diarrhea, nausea and vomiting.  All other systems reviewed and are negative.   Physical Exam Updated Vital Signs BP 96/65   Pulse 69   Temp 98.7 F (37.1 C)  (Oral)   Resp 19   Ht 5\' 9"  (1.753 m)   Wt 86.6 kg   LMP 10/20/2019   SpO2 98%   BMI 28.21 kg/m   Physical Exam Vitals and nursing note reviewed.  Constitutional:      General: She is not in acute distress.    Appearance: Normal appearance. She is well-developed.  HENT:     Head: Normocephalic and atraumatic.     Right Ear: Hearing normal.     Left Ear: Hearing normal.     Nose: Nose normal.  Eyes:     Conjunctiva/sclera: Conjunctivae normal.     Pupils: Pupils are equal, round, and reactive to light.  Cardiovascular:     Rate and Rhythm: Regular rhythm.     Heart sounds: S1 normal and S2 normal. No murmur heard.  No friction rub. No gallop.   Pulmonary:     Effort: Pulmonary effort is normal. No respiratory distress.     Breath sounds: Normal breath sounds.  Chest:     Chest wall: No tenderness.  Abdominal:     General: Bowel sounds are normal.     Palpations: Abdomen is soft.     Tenderness: There is generalized abdominal tenderness. There is no guarding or rebound. Negative signs include Murphy's sign and McBurney's sign.     Hernia: No hernia is present.  Musculoskeletal:        General: Normal range of motion.     Cervical back: Normal range of motion and neck supple.  Skin:    General: Skin is warm and dry.     Findings: No rash.  Neurological:     Mental Status: She is alert and oriented to person, place, and time.     GCS: GCS eye subscore is 4. GCS verbal subscore is 5. GCS motor subscore is 6.     Cranial Nerves: No cranial nerve deficit.     Sensory: No sensory deficit.     Coordination: Coordination normal.  Psychiatric:        Speech: Speech normal.        Behavior: Behavior normal.        Thought Content: Thought content normal.     ED Results / Procedures / Treatments   Labs (all labs ordered are listed, but only abnormal results are displayed) Labs Reviewed  CBC WITH DIFFERENTIAL/PLATELET - Abnormal; Notable for the following components:       Result Value   WBC 10.7 (*)    Hemoglobin 11.2 (*)    HCT 35.1 (*)    All other components within normal limits  COMPREHENSIVE METABOLIC PANEL - Abnormal; Notable for the following components:   Sodium 133 (*)    Glucose, Bld 240 (*)    BUN 27 (*)    Creatinine, Ser 1.45 (*)    Albumin 3.4 (*)    AST 14 (*)    GFR, Estimated 44 (*)    All other components within normal limits  RESP PANEL BY RT-PCR (FLU A&B, COVID) ARPGX2  C DIFFICILE QUICK SCREEN W PCR REFLEX  GASTROINTESTINAL PANEL BY PCR, STOOL (REPLACES STOOL CULTURE)    EKG None  Radiology No results found.  Procedures Procedures   Medications Ordered in ED Medications  sodium chloride 0.9 % bolus 1,000 mL (0 mLs Intravenous Stopped 03/10/21 0642)  morphine 4 MG/ML injection 4 mg (4 mg Intravenous Given 03/10/21 0432)    ED Course  I have reviewed the triage vital signs and the nursing notes.  Pertinent labs & imaging results that were available during my care of the patient were reviewed by me and considered in my medical decision making (see chart for details).    MDM Rules/Calculators/A&P                          Presents to the emergency department with complaints of feeling dehydrated secondary to 10 days of nausea, vomiting and diarrhea.  She is experiencing diffuse abdominal discomfort.  Examination reveals mild diffuse tenderness but no guarding, rebound, no signs of peritonitis.  COVID-negative.  CBC and comprehensive metabolic panel without significant abnormality.  Awaiting urine.  Awaiting stool for C. difficile and GI panel.  Will perform CT scan to further evaluate.  Patient being hydrated. Will sign out to oncoming ER physician  to follow results.  Final Clinical Impression(s) / ED Diagnoses Final diagnoses:  Nausea vomiting and diarrhea  Generalized abdominal pain    Rx / DC Orders ED Discharge Orders    None       Gilda Crease, MD 03/10/21 [phone]

## 2021-03-10 NOTE — ED Triage Notes (Signed)
Brought via [facility] from [facility] with c/o N/V/D x 10 days and abd pain upper that feels like burning. Decrease appetite with minimal fluid intake

## 2021-03-10 NOTE — ED Notes (Signed)
Patient mom Karla Hoover [phone] would like an update

## 2021-04-01 ENCOUNTER — Encounter (HOSPITAL_COMMUNITY): Payer: Self-pay | Admitting: *Deleted

## 2021-04-01 ENCOUNTER — Emergency Department (HOSPITAL_COMMUNITY)
Admission: EM | Admit: 2021-04-01 | Discharge: 2021-04-01 | Disposition: A | Discharge status: Home / Self Care | Payer: Medicaid Other | Attending: Emergency Medicine | Admitting: Emergency Medicine

## 2021-04-01 ENCOUNTER — Emergency Department (HOSPITAL_COMMUNITY): Payer: Medicaid Other

## 2021-04-01 ENCOUNTER — Other Ambulatory Visit: Payer: Self-pay

## 2021-04-01 DIAGNOSIS — M545 Low back pain, unspecified: Secondary | ICD-10-CM | POA: Insufficient documentation

## 2021-04-01 DIAGNOSIS — N1831 Chronic kidney disease, stage 3a: Secondary | ICD-10-CM | POA: Insufficient documentation

## 2021-04-01 DIAGNOSIS — Z7901 Long term (current) use of anticoagulants: Secondary | ICD-10-CM | POA: Insufficient documentation

## 2021-04-01 DIAGNOSIS — Z87891 Personal history of nicotine dependence: Secondary | ICD-10-CM | POA: Diagnosis not present

## 2021-04-01 DIAGNOSIS — R69 Illness, unspecified: Secondary | ICD-10-CM

## 2021-04-01 DIAGNOSIS — J449 Chronic obstructive pulmonary disease, unspecified: Secondary | ICD-10-CM | POA: Diagnosis not present

## 2021-04-01 DIAGNOSIS — E1122 Type 2 diabetes mellitus with diabetic chronic kidney disease: Secondary | ICD-10-CM | POA: Insufficient documentation

## 2021-04-01 DIAGNOSIS — J45909 Unspecified asthma, uncomplicated: Secondary | ICD-10-CM | POA: Insufficient documentation

## 2021-04-01 DIAGNOSIS — R109 Unspecified abdominal pain: Secondary | ICD-10-CM | POA: Insufficient documentation

## 2021-04-01 DIAGNOSIS — E039 Hypothyroidism, unspecified: Secondary | ICD-10-CM | POA: Diagnosis not present

## 2021-04-01 DIAGNOSIS — Z79899 Other long term (current) drug therapy: Secondary | ICD-10-CM | POA: Insufficient documentation

## 2021-04-01 DIAGNOSIS — Z7982 Long term (current) use of aspirin: Secondary | ICD-10-CM | POA: Insufficient documentation

## 2021-04-01 DIAGNOSIS — I129 Hypertensive chronic kidney disease with stage 1 through stage 4 chronic kidney disease, or unspecified chronic kidney disease: Secondary | ICD-10-CM | POA: Diagnosis not present

## 2021-04-01 DIAGNOSIS — Z794 Long term (current) use of insulin: Secondary | ICD-10-CM | POA: Diagnosis not present

## 2021-04-01 LAB — BASIC METABOLIC PANEL
Anion gap: 8 (ref 5–15)
BUN: 18 mg/dL (ref 6–20)
CO2: 26 mmol/L (ref 22–32)
Calcium: 10 mg/dL (ref 8.9–10.3)
Chloride: 100 mmol/L (ref 98–111)
Creatinine, Ser: 1.03 mg/dL — ABNORMAL HIGH (ref 0.44–1.00)
GFR, Estimated: >60 mL/min (ref >60)
Glucose, Bld: 152 mg/dL — ABNORMAL HIGH (ref 70–99)
Potassium: 5.4 mmol/L — ABNORMAL HIGH (ref 3.5–5.1)
Sodium: 134 mmol/L — ABNORMAL LOW (ref 135–145)

## 2021-04-01 LAB — CBC
HCT: 42.5 % (ref 36.0–46.0)
Hemoglobin: 14 g/dL (ref 12.0–15.0)
MCH: 27.5 pg (ref 26.0–34.0)
MCHC: 32.9 g/dL (ref 30.0–36.0)
MCV: 83.5 fL (ref 80.0–100.0)
Platelets: 260 10*3/uL (ref 150–400)
RBC: 5.09 MIL/uL (ref 3.87–5.11)
RDW: 13.1 % (ref 11.5–15.5)
WBC: 9 10*3/uL (ref 4.0–10.5)
nRBC: 0 % (ref 0.0–0.2)

## 2021-04-01 LAB — URINALYSIS, ROUTINE W REFLEX MICROSCOPIC
Bacteria, UA: NONE SEEN
Bilirubin Urine: NEGATIVE
Glucose, UA: NEGATIVE mg/dL
Hgb urine dipstick: NEGATIVE
Ketones, ur: NEGATIVE mg/dL
Leukocytes,Ua: NEGATIVE
Nitrite: POSITIVE — AB
Protein, ur: NEGATIVE mg/dL
Specific Gravity, Urine: 1.006 (ref 1.005–1.030)
pH: 5 (ref 5.0–8.0)

## 2021-04-01 LAB — HEPATIC FUNCTION PANEL
ALT: 18 U/L (ref 0–44)
AST: 29 U/L (ref 15–41)
Albumin: 3.7 g/dL (ref 3.5–5.0)
Alkaline Phosphatase: 92 U/L (ref 38–126)
Bilirubin, Direct: 0.3 mg/dL — ABNORMAL HIGH (ref 0.0–0.2)
Indirect Bilirubin: 0.7 mg/dL (ref 0.3–0.9)
Total Bilirubin: 1 mg/dL (ref 0.3–1.2)
Total Protein: 8 g/dL (ref 6.5–8.1)

## 2021-04-01 LAB — LIPASE, BLOOD: Lipase: 32 U/L (ref 11–51)

## 2021-04-01 LAB — TROPONIN I (HIGH SENSITIVITY): Troponin I (High Sensitivity): 4 ng/L (ref <18)

## 2021-04-01 MED ORDER — ACETAMINOPHEN 325 MG PO TABS
325.0000 mg | ORAL_TABLET | Freq: Once | ORAL | Status: AC
  Administered 2021-04-01: 325 mg via ORAL
  Filled 2021-04-01: qty 1

## 2021-04-01 MED ORDER — LIDOCAINE 5 % EX PTCH: 1.0000 | MEDICATED_PATCH | CUTANEOUS | 0 refills | Status: DC

## 2021-04-01 MED ORDER — HYDROCODONE-ACETAMINOPHEN 5-325 MG PO TABS
1.0000 | ORAL_TABLET | Freq: Once | ORAL | Status: AC
  Administered 2021-04-01: 1 via ORAL
  Filled 2021-04-01: qty 1

## 2021-04-01 MED ORDER — HYDROCODONE-ACETAMINOPHEN 5-325 MG PO TABS: 2.0000 | ORAL_TABLET | Freq: Four times a day (QID) | ORAL | 0 refills | Status: DC | PRN

## 2021-04-01 MED ORDER — SODIUM CHLORIDE 0.9 % IV SOLN: Freq: Once | INTRAVENOUS | Status: AC

## 2021-04-01 MED ORDER — MORPHINE SULFATE (PF) 4 MG/ML IV SOLN
4.0000 mg | Freq: Once | INTRAVENOUS | Status: AC
  Administered 2021-04-01: 4 mg via INTRAVENOUS
  Filled 2021-04-01: qty 1

## 2021-04-01 MED ORDER — CEPHALEXIN 500 MG PO CAPS: 1000.0000 mg | ORAL_CAPSULE | Freq: Two times a day (BID) | ORAL | 0 refills | Status: DC

## 2021-04-01 MED ORDER — IOHEXOL 300 MG/ML  SOLN
100.0000 mL | Freq: Once | INTRAMUSCULAR | Status: AC | PRN
  Administered 2021-04-01: 100 mL via INTRAVENOUS

## 2021-04-01 MED ORDER — SODIUM CHLORIDE 0.9 % IV SOLN
2.0000 g | Freq: Once | INTRAVENOUS | Status: AC
  Administered 2021-04-01: 2 g via INTRAVENOUS
  Filled 2021-04-01: qty 20

## 2021-04-01 NOTE — ED Notes (Signed)
Patient ambulated with assistance to and from bathroom in room. Clean catch urine spes obtained and sent to lab. Urine is cloudy and red-orange in color. No odor noted.

## 2021-04-01 NOTE — ED Notes (Signed)
RN called [facility] and they are on the way now

## 2021-04-01 NOTE — ED Notes (Signed)
Pt has UA and culture report from outpatient lab with her therefore I did not order another UA at this time

## 2021-04-01 NOTE — ED Notes (Addendum)
[facility] called to transport patient to [facility]

## 2021-04-01 NOTE — ED Notes (Signed)
Patient is resting quietly in left side lying position. HOB elevated. SR up X 2 for safety. VS monitored by cardiac monitor. Awaiting re-eval and disposition by provider.

## 2021-04-01 NOTE — ED Notes (Signed)
Patient is A/O at time of DC. DC to care of [facility] for transport to [facility].

## 2021-04-01 NOTE — ED Triage Notes (Signed)
Pt is here by EMS from [place].  Pt has UTI which was taken 6/24.  Pt is here today due to right flank pain.  Pt also reported CP which is better with rest.

## 2021-04-01 NOTE — ED Provider Notes (Signed)
[facility] EMERGENCY DEPARTMENT Provider Note   CSN: [id number] Arrival date & time: 04/01/21  1221     History Chief Complaint  Patient presents with   Flank Pain    Karla Hoover is a 50 y.o. female.  HPI Patient reports she has been having ongoing right flank pain for several days.  She reports pain is severe and sharp.  She indicates the right flank and lower back.  No falls or injuries.  Patient reports she has been diagnosed with a urinary tract infection.  She denies she is having any nausea vomiting or fever.  She does not have a lot of associated abdominal pain.  Patient is in [facility].  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  Patient reports she does have to maneuver and use her right side a lot to operate her wheelchair.  Review of EMR indicates patient was treated with Augmentin over a week ago.  Prescription was from 6/9-6/15.  Patient reports she is not getting any particular pain medication for her back.    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) 02/09/2021   Pulmonary embolism (HCC) 11/16/2019   Stroke (HCC)    Type 2 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 disease, with long-term current use of insulin (HCC) 02/09/2021    Patient Active Problem List   Diagnosis Date Noted   Acute renal failure superimposed on stage 3a  chronic kidney disease (HCC) 02/09/2021   Chronic respiratory failure with hypoxia (HCC) 02/09/2021   History of thromboembolism 02/09/2021   Type 2 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 disease, with long-term current use of insulin (HCC) 02/09/2021   Mixed 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 (HCC) 02/09/2021   GERD without esophagitis 02/09/2021   Hypothyroidism 02/09/2021   Pyelonephritis of right kidney 02/09/2021   Sepsis secondary to UTI (HCC) 02/08/2021   Lymphadenopathy 12/28/2020   VTE (venous thromboembolism) 12/01/2019   Overweight (BMI 25.0-29.9) 11/22/2019   Chronic constipation 11/22/2019   Tobacco abuse 11/16/2019   Elevated troponin 11/16/2019   Bilateral pulmonary embolism (HCC) 11/15/2019   DM (diabetes mellitus), type 2 (HCC) 11/15/2019   History of stroke 11/15/2019   Essential hypertension 05/04/2009   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 No pertinent surgical history.   OB History   No obstetric history on file.     Family History  Problem Relation Age of Onset   Diabetes Mother    COPD Sister    Heart failure Sister     Social History   Tobacco Use   Smoking status: Former    Packs/day: 1.75    Years: 36.00    Pack years: 63.00    Types: Cigarettes    Start date: 10/08/1983    Quit date: 11/22/2019    Years since quitting: 1.3   Smokeless  tobacco: Never  Vaping Use   Vaping Use: Every day   Substances: Nicotine  Substance Use Topics   Alcohol use: No   Drug use: No    Home Medications Prior to Admission medications   Medication Sig Start Date End Date Taking? Authorizing Provider  albuterol (VENTOLIN HFA) 108 (90 Base) MCG/ACT inhaler Inhale 2 puffs into the lungs every 6 (six) hours as needed for wheezing or shortness of breath. Patient taking differently: Inhale 2 puffs into the lungs every 6 (six) hours as needed for wheezing or shortness of breath ("COPD"). 10/04/19   Yes Grayce Sessions, NP  apixaban (ELIQUIS) 5 MG TABS tablet Take 2 tablets (10mg ) twice daily for 7 days, then 1 tablet (5mg ) twice daily Patient taking differently: Take 5 mg by mouth every 12 (twelve) hours. 11/21/19  Yes , DO  aspirin 81 MG chewable tablet Chew 81 mg by mouth in the morning.   Yes [provider]  atorvastatin (LIPITOR) 80 MG tablet Take 1 tablet (80 mg total) by mouth daily. Patient taking differently: Take 80 mg by mouth every evening. 10/04/19  Yes Darlin Drop, NP  cephALEXin (KEFLEX) 500 MG capsule Take 2 capsules (1,000 mg total) by mouth 2 (two) times daily. 04/01/21  Yes Audel Coakley, Grayce Sessions, MD  COZAAR 50 MG tablet Take 50 mg by mouth daily.   Yes [provider]  cyclobenzaprine (FLEXERIL) 10 MG tablet Take 1 tablet (10 mg total) by mouth 3 (three) times daily as needed for muscle spasms. 10/04/19  Yes Lebron Conners, NP  dicyclomine (BENTYL) 20 MG tablet Take 1 tablet (20 mg total) by mouth 3 (three) times daily as needed for spasms (abdominal pain). 03/10/21  Yes Grayce Sessions, MD  docusate sodium (COLACE) 100 MG capsule Take 100 mg by mouth 2 (two) times daily as needed for constipation. 08/23/19  Yes [provider]  folic acid (FOLVITE) 800 MCG tablet Take 800 mcg by mouth daily.   Yes [provider]  gabapentin (NEURONTIN) 300 MG capsule Take 1 capsule (300 mg total) by mouth 3 (three) times daily. 11/11/19 04/01/21 Yes 01/09/20, NP  HYDROcodone-acetaminophen (NORCO/VICODIN) 5-325 MG tablet Take 2 tablets by mouth every 6 (six) hours as needed. 04/01/21  Yes Grayce Sessions, MD  Insulin Glargine (BASAGLAR KWIKPEN) 100 UNIT/ML Inject 20 Units into the skin daily. Patient taking differently: Inject 27 Units into the skin daily. 02/12/21  Yes Arby Barrette, MD  insulin lispro (HUMALOG) 100 UNIT/ML injection Inject 2-14 Units into the skin 2 (two) times daily. Per sliding scale: CBG 201-250 = 2 Units,  251-300 = 4 units,301-350 = 6 units, 351-400 = 8 units, 401-450 = 10 units, 451-500 = 12 units, 501 = 14 units, recheck in 2 hours.   Yes [provider]  levothyroxine (SYNTHROID) 75 MCG tablet Take 75 mcg by mouth daily before breakfast.   Yes [provider]  lidocaine (LIDODERM) 5 % Place 1 patch onto the skin daily. Remove & Discard patch within 12 hours or as directed by MD 04/01/21  Yes Kathlen Mody, MD  linagliptin (TRADJENTA) 5 MG TABS tablet Take 5 mg by mouth daily.   Yes [provider]  metoprolol tartrate (LOPRESSOR) 25 MG tablet Take 1 tablet (25 mg total) by mouth 2 (two) times daily. 10/04/19  Yes Arby Barrette, NP  omeprazole (PRILOSEC) 20 MG capsule Take 20 mg by mouth daily.   Yes [provider]  phenazopyridine (PYRIDIUM) 200 MG  tablet Take 200 mg by mouth 2 (two) times daily. For 3 days 03/31/21  Yes [provider]  polyethylene glycol (MIRALAX / GLYCOLAX) 17 g packet Take 17 g by mouth 2 (two) times daily as needed. Patient taking differently: Take 17 g by mouth 2 (two) times daily as needed for mild constipation. 09/09/19  Yes Dartha Lodge, PA-C  Probiotic Product (PROBIOTIC-10 PO) Take 1 capsule by mouth daily. For 10 days 03/29/21  Yes [provider]  promethazine (PHENERGAN) 25 MG tablet Take 1 tablet (25 mg total) by mouth every 6 (six) hours as needed for nausea or vomiting. 03/10/21  Yes Alvira Monday, MD  senna-docusate (SENOKOT-S) 8.6-50 MG tablet Take 2 tablets by mouth 2 (two) times daily. Patient taking differently: Take 2 tablets by mouth 2 (two) times daily as needed for mild constipation. 02/12/21  Yes Kathlen Mody, MD  SYMBICORT 80-4.5 MCG/ACT inhaler Inhale 2 puffs into the lungs 2 (two) times daily.   Yes [provider]  vitamin B-12 (CYANOCOBALAMIN) 500 MCG tablet Take 500 mcg by mouth daily.   Yes [provider]  azithromycin (ZITHROMAX) 250 MG tablet Take 1 tablet (250 mg  total) by mouth daily. Take first 2 tablets together, then 1 every day until finished. Patient not taking: Reported on 04/01/2021 03/10/21   Alvira Monday, MD    Allergies    Guaifenesin, Kiwi extract, Levaquin [levofloxacin in d5w], Strawberry extract, and Baclofen  Review of Systems   Review of Systems 10 systems reviewed and negative except as per HPI Physical Exam Updated Vital Signs BP 138/67   Pulse 69   Temp 98.2 F (36.8 C) (Oral)   Resp 12   LMP 10/20/2019   SpO2 99%   Physical Exam Constitutional:      Comments: Alert with clear mental status.  No respiratory distress.  HENT:     Head: Normocephalic and atraumatic.     Mouth/Throat:     Pharynx: Oropharynx is clear.  Cardiovascular:     Rate and Rhythm: Normal rate and regular rhythm.  Pulmonary:     Effort: Pulmonary effort is normal.     Breath sounds: Normal breath sounds.  Abdominal:     General: There is no distension.     Palpations: Abdomen is soft.     Tenderness: There is no abdominal tenderness. There is no guarding.  Musculoskeletal:     Comments: Examination of the back does not show any rash or soft tissue lesions.  Patient endorses discomfort to palpation around the flank on the right and area superior to the iliac crest.  No significant peripheral edema.  Calves nontender.  Patient has flexion contracture of the left hand.  Skin:    General: Skin is warm and dry.  Neurological:     Comments: Patient is alert with clear mental status.  Speech is clear and situationally appropriate.  He has good use of her right upper extremity and right lower extremity.  She can move and reposition the left lower extremity.  No significant use of the left upper extremity.  Flexion contracture of the hand.  Psychiatric:        Mood and Affect: Mood normal.    ED Results / Procedures / Treatments   Labs (all labs ordered are listed, but only abnormal results are displayed) Labs Reviewed  BASIC METABOLIC PANEL -  Abnormal; Notable for the following components:      Result Value   Sodium 134 (*)  Potassium 5.4 (*)    Glucose, Bld 152 (*)    Creatinine, Ser 1.03 (*)    All other components within normal limits  HEPATIC FUNCTION PANEL - Abnormal; Notable for the following components:   Bilirubin, Direct 0.3 (*)    All other components within normal limits  URINALYSIS, ROUTINE W REFLEX MICROSCOPIC - Abnormal; Notable for the following components:   Color, Urine AMBER (*)    Nitrite POSITIVE (*)    All other components within normal limits  URINE CULTURE  CBC  LIPASE, BLOOD  I-STAT BETA HCG BLOOD, ED ([facility], [facility], [facility] ONLY)  TROPONIN I (HIGH SENSITIVITY)    EKG EKG Interpretation  Date/Time:  Sunday April 01 2021 12:24:54 EDT Ventricular Rate:  75 PR Interval:  178 QRS Duration: 70 QT Interval:  400 QTC Calculation: 446 R Axis:   46 Text Interpretation: Normal sinus rhythm Low voltage QRS Abnormal ECG No ischemic appearance.  Sinus rhythm compared to previous. Confirmed by Arby Barrette [phone]) on 04/01/2021 7:49:51 PM  Radiology CT Abdomen Pelvis W Contrast  Result Date: 04/01/2021 CLINICAL DATA:  Right flank pain. EXAM: CT ABDOMEN AND PELVIS WITH CONTRAST TECHNIQUE: Multidetector CT imaging of the abdomen and pelvis was performed using the standard protocol following bolus administration of intravenous contrast. CONTRAST:  OMNIPAQUE IOHEXOL 300 MG/ML  SOLN COMPARISON:  March 10, 2021 FINDINGS: Lower chest: No acute abnormality. Hepatobiliary: No focal liver abnormality is seen. No gallstones, gallbladder wall thickening, or biliary dilatation. Pancreas: Unremarkable. No pancreatic ductal dilatation or surrounding inflammatory changes. Spleen: Normal in size without focal abnormality. Adrenals/Urinary Tract: Adrenal glands are unremarkable. The right kidney is partially atrophic in appearance. A 2.3 cm x 1.5 cm cyst is seen along the posterior aspect of the upper pole of the right kidney.  Focal scarring is seen involving the upper pole of the left kidney, without renal calculi or hydronephrosis. Mild thickening of the anterior aspect of the urinary bladder wall is noted. Stomach/Bowel: Stomach is within normal limits. Appendix appears normal. No evidence of bowel wall thickening, distention, or inflammatory changes. Vascular/Lymphatic: Aortic atherosclerosis. No enlarged abdominal or pelvic lymph nodes. Reproductive: Uterus and bilateral adnexa are unremarkable. Other: No abdominal wall hernia or abnormality. No abdominopelvic ascites. Musculoskeletal: No acute or significant osseous findings. IMPRESSION: 1. Chronic changes involving both kidneys, as described above, without evidence of obstructing renal calculi. 2. Mild urinary bladder wall thickening which may represent sequelae associated with mild cystitis. Electronically Signed   By: Aram Candela M.D.   On: 04/01/2021 17:57   DG Chest Port 1 View  Result Date: 04/01/2021 CLINICAL DATA:  Right flank pain. EXAM: PORTABLE CHEST 1 VIEW COMPARISON:  Feb 12, 2021 FINDINGS: The heart size and mediastinal contours are within normal limits. Both lungs are clear. The visualized skeletal structures are unremarkable. IMPRESSION: No active disease. Electronically Signed   By: Gerome Sam III M.D   On: 04/01/2021 15:01    Procedures Procedures   Medications Ordered in ED Medications  cefTRIAXone (ROCEPHIN) 2 g in sodium chloride 0.9 % 100 mL IVPB (2 g Intravenous New Bag/Given 04/01/21 1947)  morphine 4 MG/ML injection 4 mg (4 mg Intravenous Given 04/01/21 1513)  0.9 %  sodium chloride infusion ( Intravenous New Bag/Given 04/01/21 1512)  iohexol (OMNIPAQUE) 300 MG/ML solution 100 mL (100 mLs Intravenous Contrast Given 04/01/21 1739)  HYDROcodone-acetaminophen (NORCO/VICODIN) 5-325 MG per tablet 1 tablet (1 tablet Oral Given 04/01/21 1940)  acetaminophen (TYLENOL) tablet 325 mg (325 mg Oral Given  04/01/21 1940)    ED Course  I have  reviewed the triage vital signs and the nursing notes.  Pertinent labs & imaging results that were available during my care of the patient were reviewed by me and considered in my medical decision making (see chart for details).    MDM Rules/Calculators/A&P                     Patient presents with complaint of significant flank pain.  CT scan with contrast obtained to rule out abscess or pyelonephritis.  Culture results are sent from nursing home facility with results from 6\24 indicating greater than 100,000 CFU E. coli sensitive to cephalosporins.  Repeat UA in the emergency department is negative in appearance except for nitrate positive.  No white cells.  White count, vital signs normal.  At this time I have suspicion that flank pain may be musculoskeletal in etiology.  Patient is given Rocephin 2 g IV in the emergency department with prescription for Keflex 1000 mg twice daily.  For pain control will trial short course of Vicodin with acetaminophen for mild pain.  Recommendations for follow-up with facility provider and possibly pain management or orthopedics.  Patient discharged in good condition.  She is alert and nontoxic.  Plan reviewed with the patient. Final Clinical Impression(s) / ED Diagnoses Final diagnoses:  Right flank pain  Severe comorbid illness    Rx / DC Orders ED Discharge Orders          Ordered    cephALEXin (KEFLEX) 500 MG capsule  2 times daily        04/01/21 1939    HYDROcodone-acetaminophen (NORCO/VICODIN) 5-325 MG tablet  Every 6 hours PRN        04/01/21 1939    lidocaine (LIDODERM) 5 %  Every 24 hours        04/01/21 1939             Arby BarrettePfeiffer, Anatalia Kronk, MD 04/01/21 1954

## 2021-04-01 NOTE — Discharge Instructions (Addendum)
1.  Diagnostic evaluation in the emergency department did not reveal a cause for the patient's flank pain.  CT scan was done.  No evidence of pyelonephritis, kidney stone or abscess.  Patient's urinalysis in the emergency department is not highly suggestive of infection.  A repeat culture was done. 2.  The culture was reviewed from 6\24\2022.  Based on these culture results, patient was given ceftriaxone 1 g IV in the emergency department.  Continue the patient on Keflex 1000 mg twice a day until repeat culture done in the emergency department is completed.  At that time, treating facility physician can determine appropriate ongoing antibiotic therapy if needed. 3.  Patient's back pain may be musculoskeletal.  Recommend follow-up with orthopedics or pain management.  At this time, I am prescribing Vicodin 1 to 2 tablets every 6 hours for short-term use.  When pain is not severe, may use extra strength Tylenol every 6 hours.  Also, may try topical Lidoderm patches. 4.  Patient should have recheck with facility physician within 2 to 4 days.

## 2021-04-04 LAB — URINE CULTURE: Culture: 20000 — AB

## 2021-04-05 ENCOUNTER — Telehealth: Payer: Self-pay | Admitting: Emergency Medicine

## 2021-04-05 NOTE — Telephone Encounter (Signed)
Post ED Visit - Positive Culture Follow-up  Culture report reviewed by antimicrobial stewardship pharmacist: [facility] []  , Pharm.D. []  Enzo Bi, Pharm.D., BCPS AQ-ID []  , Pharm.D., BCPS []  Celedonio Miyamoto, Pharm.D., BCPS []  Nelson, Garvin Fila.D., BCPS, AAHIVP []  , Pharm.D., BCPS, AAHIVP []  Georgina Pillion, PharmD, BCPS []  , PharmD, BCPS []  [place], PharmD, BCPS []  [address], PharmD []  , PharmD, BCPS []  Estella Husk, PharmD  Pharmacy Team []  Lysle Pearl, PharmD []  , PharmD []  Phillips Climes, PharmD []  , Rph []  Agapito Games) , PharmD []  Verlan Friends, PharmD []  , PharmD []  Mervyn Gay, PharmD []  , PharmD []  Vinnie Level, PharmD []  [facility], PharmD []  , PharmD []  Len Childs, PharmD   Positive urine culture Treated with cephalexin, organism sensitive to the same and no further patient follow-up is required at this time.  04/05/2021, 10:05 AM

## 2021-05-29 ENCOUNTER — Emergency Department (HOSPITAL_COMMUNITY): Payer: Medicaid Other

## 2021-05-29 ENCOUNTER — Observation Stay (HOSPITAL_COMMUNITY)
Admission: EM | Admit: 2021-05-29 | Discharge: 2021-05-31 | Disposition: A | Discharge status: Home / Self Care | Payer: Medicaid Other | Attending: Internal Medicine | Admitting: Internal Medicine

## 2021-05-29 ENCOUNTER — Other Ambulatory Visit: Payer: Self-pay

## 2021-05-29 ENCOUNTER — Encounter (HOSPITAL_COMMUNITY): Payer: Self-pay

## 2021-05-29 DIAGNOSIS — Z7901 Long term (current) use of anticoagulants: Secondary | ICD-10-CM | POA: Insufficient documentation

## 2021-05-29 DIAGNOSIS — E039 Hypothyroidism, unspecified: Secondary | ICD-10-CM | POA: Diagnosis present

## 2021-05-29 DIAGNOSIS — R778 Other specified abnormalities of plasma proteins: Secondary | ICD-10-CM

## 2021-05-29 DIAGNOSIS — R079 Chest pain, unspecified: Secondary | ICD-10-CM | POA: Diagnosis present

## 2021-05-29 DIAGNOSIS — I10 Essential (primary) hypertension: Secondary | ICD-10-CM | POA: Diagnosis present

## 2021-05-29 DIAGNOSIS — Z79899 Other long term (current) drug therapy: Secondary | ICD-10-CM | POA: Insufficient documentation

## 2021-05-29 DIAGNOSIS — Z794 Long term (current) use of insulin: Secondary | ICD-10-CM | POA: Insufficient documentation

## 2021-05-29 DIAGNOSIS — E1122 Type 2 diabetes mellitus with diabetic chronic kidney disease: Secondary | ICD-10-CM

## 2021-05-29 DIAGNOSIS — Z7984 Long term (current) use of oral hypoglycemic drugs: Secondary | ICD-10-CM | POA: Diagnosis not present

## 2021-05-29 DIAGNOSIS — J45909 Unspecified asthma, uncomplicated: Secondary | ICD-10-CM | POA: Diagnosis not present

## 2021-05-29 DIAGNOSIS — Z87891 Personal history of nicotine dependence: Secondary | ICD-10-CM | POA: Insufficient documentation

## 2021-05-29 DIAGNOSIS — J449 Chronic obstructive pulmonary disease, unspecified: Secondary | ICD-10-CM | POA: Insufficient documentation

## 2021-05-29 DIAGNOSIS — I129 Hypertensive chronic kidney disease with stage 1 through stage 4 chronic kidney disease, or unspecified chronic kidney disease: Secondary | ICD-10-CM | POA: Diagnosis not present

## 2021-05-29 DIAGNOSIS — E119 Type 2 diabetes mellitus without complications: Secondary | ICD-10-CM

## 2021-05-29 DIAGNOSIS — N1831 Chronic kidney disease, stage 3a: Secondary | ICD-10-CM | POA: Diagnosis not present

## 2021-05-29 DIAGNOSIS — I2699 Other pulmonary embolism without acute cor pulmonale: Secondary | ICD-10-CM | POA: Diagnosis present

## 2021-05-29 DIAGNOSIS — Z720 Tobacco use: Secondary | ICD-10-CM | POA: Diagnosis present

## 2021-05-29 DIAGNOSIS — Z7982 Long term (current) use of aspirin: Secondary | ICD-10-CM | POA: Diagnosis not present

## 2021-05-29 DIAGNOSIS — K219 Gastro-esophageal reflux disease without esophagitis: Secondary | ICD-10-CM | POA: Diagnosis present

## 2021-05-29 DIAGNOSIS — R072 Precordial pain: Secondary | ICD-10-CM | POA: Diagnosis not present

## 2021-05-29 DIAGNOSIS — Z20822 Contact with and (suspected) exposure to covid-19: Secondary | ICD-10-CM | POA: Diagnosis not present

## 2021-05-29 LAB — CBC WITH DIFFERENTIAL/PLATELET
Abs Immature Granulocytes: 0.03 10*3/uL (ref 0.00–0.07)
Basophils Absolute: 0 10*3/uL (ref 0.0–0.1)
Basophils Relative: 0 %
Eosinophils Absolute: 0.3 10*3/uL (ref 0.0–0.5)
Eosinophils Relative: 3 %
HCT: 42.4 % (ref 36.0–46.0)
Hemoglobin: 13.9 g/dL (ref 12.0–15.0)
Immature Granulocytes: 0 %
Lymphocytes Relative: 40 %
Lymphs Abs: 3.3 10*3/uL (ref 0.7–4.0)
MCH: 27.3 pg (ref 26.0–34.0)
MCHC: 32.8 g/dL (ref 30.0–36.0)
MCV: 83.3 fL (ref 80.0–100.0)
Monocytes Absolute: 0.4 10*3/uL (ref 0.1–1.0)
Monocytes Relative: 5 %
Neutro Abs: 4.2 10*3/uL (ref 1.7–7.7)
Neutrophils Relative %: 52 %
Platelets: 221 10*3/uL (ref 150–400)
RBC: 5.09 MIL/uL (ref 3.87–5.11)
RDW: 13 % (ref 11.5–15.5)
WBC: 8.3 10*3/uL (ref 4.0–10.5)
nRBC: 0 % (ref 0.0–0.2)

## 2021-05-29 LAB — HEMOGLOBIN A1C
Hgb A1c MFr Bld: 9.9 % — ABNORMAL HIGH (ref 4.8–5.6)
Mean Plasma Glucose: 237.43 mg/dL

## 2021-05-29 LAB — COMPREHENSIVE METABOLIC PANEL
ALT: 22 U/L (ref 0–44)
AST: 28 U/L (ref 15–41)
Albumin: 4 g/dL (ref 3.5–5.0)
Alkaline Phosphatase: 101 U/L (ref 38–126)
Anion gap: 8 (ref 5–15)
BUN: 24 mg/dL — ABNORMAL HIGH (ref 6–20)
CO2: 25 mmol/L (ref 22–32)
Calcium: 9.6 mg/dL (ref 8.9–10.3)
Chloride: 99 mmol/L (ref 98–111)
Creatinine, Ser: 1.24 mg/dL — ABNORMAL HIGH (ref 0.44–1.00)
GFR, Estimated: 53 mL/min — ABNORMAL LOW (ref >60)
Glucose, Bld: 297 mg/dL — ABNORMAL HIGH (ref 70–99)
Potassium: 5.2 mmol/L — ABNORMAL HIGH (ref 3.5–5.1)
Sodium: 132 mmol/L — ABNORMAL LOW (ref 135–145)
Total Bilirubin: 0.5 mg/dL (ref 0.3–1.2)
Total Protein: 7.9 g/dL (ref 6.5–8.1)

## 2021-05-29 LAB — RESP PANEL BY RT-PCR (FLU A&B, COVID) ARPGX2
Influenza A by PCR: NEGATIVE
Influenza B by PCR: NEGATIVE
SARS Coronavirus 2 by RT PCR: NEGATIVE

## 2021-05-29 LAB — TROPONIN I (HIGH SENSITIVITY)
Troponin I (High Sensitivity): 176 ng/L (ref <18)
Troponin I (High Sensitivity): 151 ng/L (ref <18)

## 2021-05-29 LAB — CBG MONITORING, ED: Glucose-Capillary: 141 mg/dL — ABNORMAL HIGH (ref 70–99)

## 2021-05-29 MED ORDER — FOLIC ACID 1 MG PO TABS
1.0000 mg | ORAL_TABLET | Freq: Every day | ORAL | Status: DC
  Administered 2021-05-29 – 2021-05-31 (×3): 1 mg via ORAL
  Filled 2021-05-29 (×3): qty 1

## 2021-05-29 MED ORDER — MORPHINE SULFATE (PF) 2 MG/ML IV SOLN: 2.0000 mg | INTRAVENOUS | Status: DC | PRN

## 2021-05-29 MED ORDER — ONDANSETRON HCL 4 MG PO TABS: 4.0000 mg | ORAL_TABLET | Freq: Four times a day (QID) | ORAL | Status: DC | PRN

## 2021-05-29 MED ORDER — HYDROCODONE-ACETAMINOPHEN 5-325 MG PO TABS
1.0000 | ORAL_TABLET | ORAL | Status: DC | PRN
  Administered 2021-05-29 – 2021-05-30 (×2): 2 via ORAL
  Administered 2021-05-30 – 2021-05-31 (×2): 1 via ORAL
  Administered 2021-05-31: 2 via ORAL
  Filled 2021-05-29: qty 1
  Filled 2021-05-29 (×2): qty 2
  Filled 2021-05-29: qty 1
  Filled 2021-05-29: qty 2

## 2021-05-29 MED ORDER — SENNOSIDES-DOCUSATE SODIUM 8.6-50 MG PO TABS
2.0000 | ORAL_TABLET | Freq: Two times a day (BID) | ORAL | Status: DC
  Administered 2021-05-29 – 2021-05-31 (×4): 2 via ORAL
  Filled 2021-05-29 (×4): qty 2

## 2021-05-29 MED ORDER — METOPROLOL TARTRATE 25 MG PO TABS
25.0000 mg | ORAL_TABLET | Freq: Two times a day (BID) | ORAL | Status: DC
  Administered 2021-05-29 – 2021-05-31 (×4): 25 mg via ORAL
  Filled 2021-05-29 (×4): qty 1

## 2021-05-29 MED ORDER — ATORVASTATIN CALCIUM 40 MG PO TABS
80.0000 mg | ORAL_TABLET | Freq: Every day | ORAL | Status: DC
  Administered 2021-05-29 – 2021-05-31 (×3): 80 mg via ORAL
  Filled 2021-05-29 (×3): qty 2

## 2021-05-29 MED ORDER — VITAMIN B-12 500 MCG PO TABS: 500.0000 ug | ORAL_TABLET | Freq: Every day | ORAL | Status: DC

## 2021-05-29 MED ORDER — SODIUM CHLORIDE 0.9 % IV BOLUS
1000.0000 mL | Freq: Once | INTRAVENOUS | Status: AC
  Administered 2021-05-29: 1000 mL via INTRAVENOUS

## 2021-05-29 MED ORDER — INSULIN ASPART 100 UNIT/ML IJ SOLN
0.0000 [IU] | Freq: Three times a day (TID) | INTRAMUSCULAR | Status: DC
  Administered 2021-05-29: 2 [IU] via SUBCUTANEOUS
  Administered 2021-05-30: 5 [IU] via SUBCUTANEOUS
  Administered 2021-05-30: 11 [IU] via SUBCUTANEOUS
  Administered 2021-05-30: 3 [IU] via SUBCUTANEOUS
  Administered 2021-05-31 (×2): 5 [IU] via SUBCUTANEOUS
  Filled 2021-05-29: qty 0.15

## 2021-05-29 MED ORDER — ACETAMINOPHEN 325 MG PO TABS: 650.0000 mg | ORAL_TABLET | Freq: Four times a day (QID) | ORAL | Status: DC | PRN

## 2021-05-29 MED ORDER — GABAPENTIN 300 MG PO CAPS
300.0000 mg | ORAL_CAPSULE | Freq: Three times a day (TID) | ORAL | Status: DC
  Administered 2021-05-29 – 2021-05-31 (×5): 300 mg via ORAL
  Filled 2021-05-29 (×6): qty 1

## 2021-05-29 MED ORDER — ACETAMINOPHEN 650 MG RE SUPP: 650.0000 mg | Freq: Four times a day (QID) | RECTAL | Status: DC | PRN

## 2021-05-29 MED ORDER — FOLIC ACID 800 MCG PO TABS: 800.0000 ug | ORAL_TABLET | Freq: Every day | ORAL | Status: DC

## 2021-05-29 MED ORDER — APIXABAN 5 MG PO TABS
5.0000 mg | ORAL_TABLET | Freq: Two times a day (BID) | ORAL | Status: DC
  Administered 2021-05-29 – 2021-05-31 (×4): 5 mg via ORAL
  Filled 2021-05-29 (×4): qty 1

## 2021-05-29 MED ORDER — VITAMIN B-12 1000 MCG PO TABS
500.0000 ug | ORAL_TABLET | Freq: Every day | ORAL | Status: DC
  Administered 2021-05-30 – 2021-05-31 (×2): 500 ug via ORAL
  Filled 2021-05-29 (×2): qty 1

## 2021-05-29 MED ORDER — CHLORHEXIDINE GLUCONATE 0.12 % MT SOLN
15.0000 mL | Freq: Every day | OROMUCOSAL | Status: DC
  Administered 2021-05-30 – 2021-05-31 (×2): 15 mL via OROMUCOSAL
  Filled 2021-05-29 (×2): qty 15

## 2021-05-29 MED ORDER — INSULIN GLARGINE-YFGN 100 UNIT/ML ~~LOC~~ SOLN
10.0000 [IU] | Freq: Every day | SUBCUTANEOUS | Status: DC
Start: 2021-05-29 — End: 2021-05-31
  Administered 2021-05-29 – 2021-05-30 (×2): 10 [IU] via SUBCUTANEOUS
  Filled 2021-05-29 (×4): qty 0.1

## 2021-05-29 MED ORDER — ASPIRIN 81 MG PO CHEW
81.0000 mg | CHEWABLE_TABLET | Freq: Every day | ORAL | Status: DC
  Administered 2021-05-30 – 2021-05-31 (×2): 81 mg via ORAL
  Filled 2021-05-29 (×2): qty 1

## 2021-05-29 MED ORDER — HYDROXYZINE HCL 10 MG PO TABS: 10.0000 mg | ORAL_TABLET | Freq: Two times a day (BID) | ORAL | Status: DC | PRN

## 2021-05-29 MED ORDER — LOSARTAN POTASSIUM 50 MG PO TABS
75.0000 mg | ORAL_TABLET | Freq: Every day | ORAL | Status: DC
  Administered 2021-05-29 – 2021-05-31 (×3): 75 mg via ORAL
  Filled 2021-05-29 (×2): qty 1
  Filled 2021-05-29: qty 3

## 2021-05-29 MED ORDER — ONDANSETRON HCL 4 MG/2ML IJ SOLN: 4.0000 mg | Freq: Four times a day (QID) | INTRAMUSCULAR | Status: DC | PRN

## 2021-05-29 MED ORDER — PANTOPRAZOLE SODIUM 40 MG PO TBEC
40.0000 mg | DELAYED_RELEASE_TABLET | Freq: Every day | ORAL | Status: DC
Start: 2021-05-29 — End: 2021-05-31
  Administered 2021-05-29 – 2021-05-31 (×3): 40 mg via ORAL
  Filled 2021-05-29 (×3): qty 1

## 2021-05-29 MED ORDER — ALBUTEROL SULFATE (2.5 MG/3ML) 0.083% IN NEBU: 2.5000 mg | INHALATION_SOLUTION | Freq: Four times a day (QID) | RESPIRATORY_TRACT | Status: DC | PRN

## 2021-05-29 MED ORDER — LEVOTHYROXINE SODIUM 50 MCG PO TABS
75.0000 ug | ORAL_TABLET | Freq: Every day | ORAL | Status: DC
  Administered 2021-05-30 – 2021-05-31 (×2): 75 ug via ORAL
  Filled 2021-05-29 (×2): qty 1

## 2021-05-29 MED ORDER — LORAZEPAM 2 MG/ML IJ SOLN
1.0000 mg | Freq: Once | INTRAMUSCULAR | Status: AC | PRN
  Administered 2021-05-29: 1 mg via INTRAVENOUS
  Filled 2021-05-29: qty 1

## 2021-05-29 MED ORDER — MOMETASONE FURO-FORMOTEROL FUM 100-5 MCG/ACT IN AERO
2.0000 | INHALATION_SPRAY | Freq: Two times a day (BID) | RESPIRATORY_TRACT | Status: DC
  Administered 2021-05-29 – 2021-05-31 (×4): 2 via RESPIRATORY_TRACT
  Filled 2021-05-29: qty 8.8

## 2021-05-29 MED ORDER — ALBUTEROL SULFATE HFA 108 (90 BASE) MCG/ACT IN AERS: 2.0000 | INHALATION_SPRAY | Freq: Four times a day (QID) | RESPIRATORY_TRACT | Status: DC | PRN

## 2021-05-29 MED ORDER — IOHEXOL 350 MG/ML SOLN
100.0000 mL | Freq: Once | INTRAVENOUS | Status: AC | PRN
  Administered 2021-05-29: 100 mL via INTRAVENOUS

## 2021-05-29 MED ORDER — OMEGA-3-ACID ETHYL ESTERS 1 G PO CAPS
1.0000 g | ORAL_CAPSULE | Freq: Every day | ORAL | Status: DC
  Administered 2021-05-30 – 2021-05-31 (×2): 1 g via ORAL
  Filled 2021-05-29 (×2): qty 1

## 2021-05-29 NOTE — Plan of Care (Signed)
Discussed with EDP-regarding multiple arm numbness/pain complaints. MRI brain and C-spine unremarkable for acute stroke or acute compressive pathology. CT angiography head and neck with multifocal atherosclerosis causing stenosis of varying degrees in anterior and posterior circulation-worsen in posterior circulation.  Discussed with on-call neuro interventionalist regarding any utility of an angio but given the noncharacteristic appearance of FMD or vasculitis or even our CVS-no emergent need for any neurological intervention at this time She should follow-up with outpatient neurology given the prior history of stroke at young age. She did have increased troponins-management per EDP. Please call us with questions or we can be of assistance.  -- Milon Dikes, MD Neurologist [facility] Pager: [phone]

## 2021-05-29 NOTE — ED Notes (Signed)
Patient noncompliant in regards to staying in her bed and staying on the cardiac monitor. Patient is out of bed and in her wheelchair that she uses at the Nursing facility.  I have given her the admission room number.

## 2021-05-29 NOTE — ED Notes (Signed)
Patient appears very drowsy after the Ativan dose given for MRI,  Awaiting IV access ( IV team consult has been placed for ASAP)

## 2021-05-29 NOTE — H&P (Signed)
History and Physical    Karla Hoover ZOX:[id number] DOB: 29-Apr-1971 DOA: 05/29/2021  PCP: Patient, [name]  Patient coming from: snf  I have personally briefly reviewed patient's old medical records in [facility]  Chief Complaint: cp/arm pain  HPI: Karla Hoover is a 50 y.o. female with medical history significant of asthma, COPD, diabetes, hypertension, history of PE, history of stroke with left-sided residual deficits facial droop presents with 2 days of upper extremity numbness and pain that radiated to her neck and chest.  Symptoms of arm pain and numbness are waxing and waning in nature.  Patient initially reported 9 days of gradual waxing and waning shoulder bilaterally arm and pain weakness.  No nausea vomiting or diaphoresis.  Patient is initial blood pressures 130s to 150s over 80s to 100s.  Patient's troponin 176 down trended to 151.  Sodium 132 potassium 5.2 glucose 297 creatinine 1.24 GFR 53.  CBC unremarkable except platelets 221.  Chest x-ray negative, CT of the chest showed suboptimal opacification of pulmonary arteries, no large central pulmonary embolus identified.  EKG showing sinus rhythm.  Hospitalist service called to admit for elevated troponins.  Patient given Ativan liter of IV fluid in the ED. patient also endorses some bilateral upper extremity pain shoulders also right hand and wrist pain is improved.  Patient states she came in today to be evaluated due to ongoing pain of intermittently chest pain, patient is unsure of any relieving factors or worsening factors.  Patient also smokes half pack per day.  Patient denies any nausea vomiting diaphoresis.  Patient is unsure of last stress test.  Patient states she is [facility].  CT HEAD/NECK IMPRESSION: 1. Occlusion of the right internal carotid artery from the origin through the cavernous segment with reconstitution of flow of the MCA and ACA likely via collateral flow from the  anterior communicating artery. 2. Multifocal irregularity with diffuse moderate to severe stenosis of the left vertebral artery most suspicious for chronic dissection. The distal left V4 segment is patent, possibly via collateral flow from the right vertebral artery. 3. Linear filling defect in the left cavernous ICA she suspicious for dissection without hemodynamically significant stenosis or occlusion. Correlation with angiogram is recommended if indicated. 4. Irregularity with multifocal severe stenosis/occlusion of the distal left PCA branches.  MRI of the brain negative for acute infarct. IMPRESSION: 1. No acute intracranial hemorrhage or infarct. 2. Remote infarct in the right basal ganglia and external capsule, unchanged. Additional smaller remote infarcts in the right cerebral hemisphere and left cerebellar hemisphere as above. 3. Attenuation of the right intracranial ICA flow void; this could be further evaluated with CTA or MRA head/neck as indicated.   MR cervical spine IMPRESSION: Mild degenerative changes at C3-C4 as above result in mild-to-moderate spinal canal stenosis without evidence of cord compression or cord signal abnormality and mild right and no significant left neural foraminal stenosis.   Otherwise, unremarkable MRI of the cervical spine.  ED Course: As above ED provider Dr. Rush Landmark discussed the patient with neurologist Dr. Jerrell Belfast who recommended above imaging I also discussed the patient with on-call neuro interventionalists regarding findings, essentially no emergent need for neuro very neurological intervention at this time patient to follow-up with outpatient neurology.  Review of Systems: As per HPI otherwise all other systems reviewed and are negative.  Past Medical History:  Diagnosis Date   Asthma    Chronic kidney disease, stage 3a (HCC) 02/09/2021   Chronic respiratory  failure with hypoxia (HCC) 02/09/2021   COPD (chronic obstructive pulmonary  disease) (HCC)    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   Former smoker 36 pack year smoking history     Diabetes mellitus without complication (HCC)    Essential hypertension 05/04/2009   Qualifier: Diagnosis of  By: Roxan Hockey CMA, Jessica     GERD without esophagitis 02/09/2021   Hypertension    Hypothyroidism 02/09/2021   Mixed 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 (HCC) 02/09/2021   Pulmonary embolism (HCC) 11/16/2019   Stroke (HCC)    Type 2 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 disease, with long-term current use of insulin (HCC) 02/09/2021    History reviewed. No pertinent surgical history.  Social History  reports that she quit smoking about 18 months ago. Her smoking use included cigarettes. She started smoking about 37 years ago. She has a 63.00 pack-year smoking history. She has never used smokeless tobacco. She reports that she does not drink alcohol and does not use drugs.  Allergies  Allergen Reactions   Guaifenesin Anaphylaxis   Kiwi Extract Anaphylaxis and Rash   Levaquin [Levofloxacin In D5w] Shortness Of Breath and Rash   Strawberry Extract Anaphylaxis and Rash   Baclofen Other (See Comments)    Near syncope/ fall    Family History  Problem Relation Age of Onset   Diabetes Mother    COPD Sister    Heart failure Sister      Prior to Admission medications   Medication Sig Start Date End Date Taking? Authorizing Provider  acetaminophen (TYLENOL) 500 MG tablet Take 1,000 mg by mouth in the morning, at noon, and at bedtime.   Yes [provider]  albuterol (VENTOLIN HFA) 108 (90 Base) MCG/ACT inhaler Inhale 2 puffs into the lungs every 6 (six) hours as needed for wheezing or shortness of breath. Patient taking differently: Inhale 2 puffs into the lungs every 6 (six) hours as needed for wheezing or shortness of breath ("COPD"). 10/04/19  Yes Grayce Sessions, NP  apixaban (ELIQUIS) 5 MG TABS tablet Take 2 tablets  (10mg ) twice daily for 7 days, then 1 tablet (5mg ) twice daily Patient taking differently: Take 5 mg by mouth every 12 (twelve) hours. 11/21/19  Yes , DO  aspirin 81 MG chewable tablet Chew 81 mg by mouth in the morning.   Yes [provider]  atorvastatin (LIPITOR) 80 MG tablet Take 1 tablet (80 mg total) by mouth daily. Patient taking differently: Take 80 mg by mouth every evening. 10/04/19  Yes Darlin Drop, NP  chlorhexidine (PERIDEX) 0.12 % solution Use as directed 15 mLs in the mouth or throat daily.   Yes [provider]  COZAAR 50 MG tablet Take 75 mg by mouth daily.   Yes [provider]  cyclobenzaprine (FLEXERIL) 10 MG tablet Take 1 tablet (10 mg total) by mouth 3 (three) times daily as needed for muscle spasms. Patient taking differently: Take 10 mg by mouth See admin instructions. 10mg  at bedtime each night, may take an additional 10mg  three times a day as needed for muscle spasms 10/04/19  Yes Grayce Sessions, NP  folic acid (FOLVITE) 800 MCG tablet Take 800 mcg by mouth daily.   Yes [provider]  gabapentin (NEURONTIN) 300 MG capsule Take 1 capsule (300 mg total) by mouth 3 (three) times daily. Patient taking differently: Take 600 mg by mouth at bedtime. 11/11/19 07/14/21 Yes 10/06/19, NP  hydrOXYzine (  ATARAX/VISTARIL) 10 MG tablet Take 10 mg by mouth 2 (two) times daily as needed for anxiety.   Yes [provider]  insulin glargine (LANTUS) 100 UNIT/ML injection Inject 35 Units into the skin daily.   Yes [provider]  insulin lispro (HUMALOG) 100 UNIT/ML injection Inject 2-14 Units into the skin 3 (three) times daily with meals. Per sliding scale: CBG 201-250 = 2 Units, 251-300 = 4 units,301-350 = 6 units, 351-400 = 8 units, 401-450 = 10 units, 451-500 = 12 units, 501 = 14 units, recheck in 2 hours.   Yes [provider]  levothyroxine (SYNTHROID) 75 MCG tablet Take 75 mcg by mouth  daily before breakfast.   Yes [provider]  lidocaine (LIDODERM) 5 % Place 1 patch onto the skin daily. Remove & Discard patch within 12 hours or as directed by MD 04/01/21  Yes Arby Barrette, MD  linagliptin (TRADJENTA) 5 MG TABS tablet Take 5 mg by mouth daily.   Yes [provider]  metoprolol tartrate (LOPRESSOR) 25 MG tablet Take 1 tablet (25 mg total) by mouth 2 (two) times daily. 10/04/19  Yes Grayce Sessions, NP  naproxen (NAPROSYN) 250 MG tablet Take 250 mg by mouth at bedtime.   Yes [provider]  Omega-3 Fatty Acids (FISH OIL) 1000 MG CAPS Take 1,000 mg by mouth in the morning and at bedtime.   Yes [provider]  omeprazole (PRILOSEC) 20 MG capsule Take 20 mg by mouth daily.   Yes [provider]  polyethylene glycol (MIRALAX / GLYCOLAX) 17 g packet Take 17 g by mouth 2 (two) times daily as needed. Patient taking differently: Take 17 g by mouth 2 (two) times daily as needed for mild constipation. 09/09/19  Yes Dartha Lodge, PA-C  SYMBICORT 80-4.5 MCG/ACT inhaler Inhale 2 puffs into the lungs 2 (two) times daily.   Yes [provider]  vitamin B-12 (CYANOCOBALAMIN) 500 MCG tablet Take 500 mcg by mouth daily.   Yes [provider]  azithromycin (ZITHROMAX) 250 MG tablet Take 1 tablet (250 mg total) by mouth daily. Take first 2 tablets together, then 1 every day until finished. Patient not taking: No sig reported 03/10/21   Alvira Monday, MD  cephALEXin (KEFLEX) 500 MG capsule Take 2 capsules (1,000 mg total) by mouth 2 (two) times daily. Patient not taking: No sig reported 04/01/21   Arby Barrette, MD  dicyclomine (BENTYL) 20 MG tablet Take 1 tablet (20 mg total) by mouth 3 (three) times daily as needed for spasms (abdominal pain). Patient not taking: Reported on 05/29/2021 03/10/21   Alvira Monday, MD  HYDROcodone-acetaminophen (NORCO/VICODIN) 5-325 MG tablet Take 2 tablets by mouth every 6 (six) hours as  needed. Patient not taking: Reported on 05/29/2021 04/01/21   Arby Barrette, MD  Insulin Glargine [facility]) 100 UNIT/ML Inject 20 Units into the skin daily. Patient not taking: Reported on 05/29/2021 02/12/21   Kathlen Mody, MD  promethazine (PHENERGAN) 25 MG tablet Take 1 tablet (25 mg total) by mouth every 6 (six) hours as needed for nausea or vomiting. Patient not taking: Reported on 05/29/2021 03/10/21   Alvira Monday, MD  senna-docusate (SENOKOT-S) 8.6-50 MG tablet Take 2 tablets by mouth 2 (two) times daily. Patient not taking: Reported on 05/29/2021 02/12/21   Kathlen Mody, MD    Physical Exam: Vitals:   05/29/21 1415 05/29/21 1430 05/29/21 1445 05/29/21 1545  BP: (!) 134/109 (!) 152/89 (!) 158/81 (!) 169/111  Pulse: 100 90 90  99  Resp:  Temp:      TempSrc:      SpO2: 95% 97% 97% 96%  Weight:      Height:        Constitutional: NAD, calm, comfortable Vitals:   05/29/21 1415 05/29/21 1430 05/29/21 1445 05/29/21 1545  BP: (!) 134/109 (!) 152/89 (!) 158/81 (!) 169/111  Pulse: 100 90 90 99  Resp:  Temp:      TempSrc:      SpO2: 95% 97% 97% 96%  Weight:      Height:       Eyes: PERRL, lids and conjunctivae normal ENMT: Mucous membranes are moist. Posterior pharynx clear of any exudate or lesions.Normal dentition.  Neck: normal, supple, no masses, no thyromegaly Respiratory: clear to auscultation bilaterally, no wheezing, no crackles. Normal respiratory effort. No accessory muscle use.  Cardiovascular: Regular rate and rhythm, no murmurs / rubs / gallops. No extremity edema. 2+ pedal pulses.   Abdomen: no tenderness, no masses palpated. No hepatosplenomegaly. Bowel sounds positive.  Musculoskeletal: 1 out of 5 left upper and lower extremity, 3 out of 5 right open extremity, 1+ edema left lower extremity, trace edema right lower extremity Skin: no rashes, lesions, ulcers. No induration Neurologic: CN 2-12 grossly intact. Sensation equal to light  touch bilateral upper and lower extremity, 1 out of 5 left upper and lower extremity, 3 out of 5 right upper and lower extremity psychiatric: Normal judgment and insight. Alert and oriented x 3. Normal mood.   Labs on Admission: I have personally reviewed following labs and imaging studies  CBC: Recent Labs  Lab 05/29/21 0845  WBC 8.3  NEUTROABS 4.2  HGB 13.9  HCT 42.4  MCV 83.3  PLT 221    Basic Metabolic Panel: Recent Labs  Lab 05/29/21 0845  NA 132*  K 5.2*  CL 99  CO2 25  GLUCOSE 297*  BUN 24*  CREATININE 1.24*  CALCIUM 9.6    GFR: Estimated Creatinine Clearance: 63.6 mL/min (A) (by C-G formula based on SCr of 1.24 mg/dL (H)).  Liver Function Tests: Recent Labs  Lab 05/29/21 0845  AST 28  ALT 22  ALKPHOS 101  BILITOT 0.5  PROT 7.9  ALBUMIN 4.0    Urine analysis:    Component Value Date/Time   COLORURINE AMBER (A) 04/01/2021 1510   APPEARANCEUR CLEAR 04/01/2021 1510   APPEARANCEUR Clear 11/24/2012 1238   LABSPEC 1.006 04/01/2021 1510   LABSPEC 1.000 11/24/2012 1238   PHURINE 5.0 04/01/2021 1510   GLUCOSEU NEGATIVE 04/01/2021 1510   GLUCOSEU >=500 11/24/2012 1238   HGBUR NEGATIVE 04/01/2021 1510   BILIRUBINUR NEGATIVE 04/01/2021 1510   BILIRUBINUR Negative 11/24/2012 1238   KETONESUR NEGATIVE 04/01/2021 1510   PROTEINUR NEGATIVE 04/01/2021 1510   UROBILINOGEN 1.0 04/30/2010 0902   NITRITE POSITIVE (A) 04/01/2021 1510   LEUKOCYTESUR NEGATIVE 04/01/2021 1510   LEUKOCYTESUR Negative 11/24/2012 1238    Radiological Exams on Admission: CT Angio Head W or Wo Contrast  Result Date: 05/29/2021 CLINICAL DATA:  Pain, numbness, and weakness in bilateral arms EXAM: CT ANGIOGRAPHY HEAD AND NECK TECHNIQUE: Multidetector CT imaging of the head and neck was performed using the standard protocol during bolus administration of intravenous contrast. Multiplanar CT image reconstructions and MIPs were obtained to evaluate the vascular anatomy. Carotid stenosis  measurements (when applicable) are obtained utilizing NASCET criteria, using the distal internal carotid diameter as the denominator. CONTRAST:  OMNIPAQUE IOHEXOL 350 MG/ML SOLN  COMPARISON:  Same-day brain and cervical spine MRIs, CT head 11/03/2019 FINDINGS: CT HEAD FINDINGS Brain: There is no evidence of acute intracranial hemorrhage, extra-axial fluid collection, or acute infarct. There is a remote infarct in the right basal ganglia/external capsule. Additional smaller remote infarcts are seen in the right precentral gyrus and parietal lobe cortex. There is unchanged ex vacuo dilatation of the right lateral ventricle. The ventricles are otherwise normal in size and configuration. No mass lesion is identified. There is no midline shift. Vascular: See below Skull: Normal. Negative for fracture or focal lesion. Sinuses: Imaged portions are clear. Orbits: No acute finding. Review of the MIP images confirms the above findings CTA NECK FINDINGS Aortic arch: Standard branching. Imaged portion shows no evidence of aneurysm or dissection. No significant stenosis of the major arch vessel origins. Right carotid system: The right common carotid artery is patent. The internal carotid artery is occluded from shortly after the origin through the supraclinoid segment. Left carotid system: There is soft and calcified atherosclerotic plaque of the left carotid bulb resulting in less than 50% stenosis. There is no evidence of dissection or aneurysm. Vertebral arteries: There is soft plaque throughout the proximal left subclavian artery with involvement of the ostium of the left renal artery with moderate to severe stenosis at the origin. There is marked multifocal stenosis throughout the remainder of the cervical portion of the left vertebral artery. The right vertebral artery is patent throughout. Skeleton: There is degenerative change of the temporomandibular joints, right more than left there is no acute osseous  abnormality. Other neck: The soft tissues are unremarkable. There is dental disease involving multiple teeth. Upper chest: The lung apices are clear. Review of the MIP images confirms the above findings CTA HEAD FINDINGS Anterior circulation: The right intracranial internal carotid artery is occluded with reconstitution of flow at the communicating segment likely via collateral flow from the anterior communicating artery. There is a linear filling defect in the cavernous segment of the left ICA (11-201, 12-86). There is no hemodynamically significant stenosis of the true lumen. The bilateral MCAs and ACAs are patent, though there is relatively diminutive enhancement of the right M1 segment compared to the left. No aneurysm is identified. Posterior circulation: The proximal left V4 segment is moderately stenotic with reconstitution of more normal caliber flow distally, possibly via retrograde flow from the right vertebral artery. The right V4 segment is patent. The basilar artery is patent. The proximal PCAs are patent. There is multifocal stenosis/occlusion of the left PCA beyond the P1 segment. The right PCA is patent. Venous sinuses: As permitted by contrast timing, patent. Anatomic variants: None. Review of the MIP images confirms the above findings IMPRESSION: 1. 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 2. Multifocal irregularity with diffuse moderate to severe stenosis of the left vertebral artery most suspicious for chronic dissection. The distal left V4 segment is patent, possibly via collateral flow from the right vertebral artery. 3. Linear filling defect in the left cavernous ICA she suspicious for dissection without hemodynamically significant stenosis or occlusion. Correlation with angiogram is recommended if indicated. 4. Irregularity with  multifocal severe stenosis/occlusion of the distal left PCA branches. Electronically Signed   By: Lesia Hausen M.D.   On: 05/29/2021 13:59   DG Chest 2 View  Result Date: 05/29/2021 CLINICAL DATA:  Chest pain. EXAM: CHEST -  2 VIEW COMPARISON:  April 01, 2021. FINDINGS: The heart size and mediastinal contours are within normal limits. Both lungs are clear. The visualized skeletal structures are unremarkable. IMPRESSION: No active cardiopulmonary disease. Electronically Signed   By: Lupita Raider M.D.   On: 05/29/2021 09:26   CT Angio Neck W and/or Wo Contrast  Result Date: 05/29/2021 CLINICAL DATA:  Pain, numbness, and weakness in bilateral arms EXAM: CT ANGIOGRAPHY HEAD AND NECK TECHNIQUE: Multidetector CT imaging of the head and neck was performed using the standard protocol during bolus administration of intravenous contrast. Multiplanar CT image reconstructions and MIPs were obtained to evaluate the vascular anatomy. Carotid stenosis measurements (when applicable) are obtained utilizing NASCET criteria, using the distal internal carotid diameter as the denominator. CONTRAST:  OMNIPAQUE IOHEXOL 350 MG/ML SOLN COMPARISON:  Same-day brain and cervical spine MRIs, CT head 11/03/2019 FINDINGS: CT HEAD FINDINGS Brain: There is no evidence of acute intracranial hemorrhage, extra-axial fluid collection, or acute infarct. There is a remote infarct in the right basal ganglia/external capsule. Additional smaller remote infarcts are seen in the right precentral gyrus and parietal lobe cortex. There is unchanged ex vacuo dilatation of the right lateral ventricle. The ventricles are otherwise normal in size and configuration. No mass lesion is identified. There is no midline shift. Vascular: See below Skull: Normal. Negative for fracture or focal lesion. Sinuses: Imaged portions are clear. Orbits: No acute finding. Review of the MIP images confirms the above findings CTA NECK FINDINGS Aortic arch: Standard  branching. Imaged portion shows no evidence of aneurysm or dissection. No significant stenosis of the major arch vessel origins. Right carotid system: The right common carotid artery is patent. The internal carotid artery is occluded from shortly after the origin through the supraclinoid segment. Left carotid system: There is soft and calcified atherosclerotic plaque of the left carotid bulb resulting in less than 50% stenosis. There is no evidence of dissection or aneurysm. Vertebral arteries: There is soft plaque throughout the proximal left subclavian artery with involvement of the ostium of the left renal artery with moderate to severe stenosis at the origin. There is marked multifocal stenosis throughout the remainder of the cervical portion of the left vertebral artery. The right vertebral artery is patent throughout. Skeleton: There is degenerative change of the temporomandibular joints, right more than left there is no acute osseous abnormality. Other neck: The soft tissues are unremarkable. There is dental disease involving multiple teeth. Upper chest: The lung apices are clear. Review of the MIP images confirms the above findings CTA HEAD FINDINGS Anterior circulation: The right intracranial internal carotid artery is occluded with reconstitution of flow at the communicating segment likely via collateral flow from the anterior communicating artery. There is a linear filling defect in the cavernous segment of the left ICA (11-201, 12-86). There is no hemodynamically significant stenosis of the true lumen. The bilateral MCAs and ACAs are patent, though there is relatively diminutive enhancement of the right M1 segment compared to the left. No aneurysm is identified. Posterior circulation: The proximal left V4 segment is moderately stenotic with reconstitution of more normal caliber flow distally, possibly via retrograde flow from the right vertebral artery. The right V4 segment is patent. The basilar artery  is patent. The proximal PCAs are patent. There is multifocal stenosis/occlusion of the left PCA beyond the P1 segment. The right PCA is patent. Venous sinuses: As permitted by contrast timing, patent. Anatomic variants: None. Review of the MIP images confirms the above findings IMPRESSION:  1. 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 2. Multifocal irregularity with diffuse moderate to severe stenosis of the left vertebral artery most suspicious for chronic dissection. The distal left V4 segment is patent, possibly via collateral flow from the right vertebral artery. 3. Linear filling defect in the left cavernous ICA she suspicious for dissection without hemodynamically significant stenosis or occlusion. Correlation with angiogram is recommended if indicated. 4. Irregularity with multifocal severe stenosis/occlusion of the distal left PCA branches. Electronically Signed   By: Lesia HausenPeter  Noone M.D.   On: 05/29/2021 13:59   CT Angio Chest PE W and/or Wo Contrast  Result Date: 05/29/2021 CLINICAL DATA:  Right hand pain radiating up arm into chest. EXAM: CT ANGIOGRAPHY CHEST WITH CONTRAST TECHNIQUE: Multidetector CT imaging of the chest was performed using the standard protocol during bolus administration of intravenous contrast. Multiplanar CT image reconstructions and MIPs were obtained to evaluate the vascular anatomy. CONTRAST:  100mL OMNIPAQUE IOHEXOL 350 MG/ML SOLN COMPARISON:  11/15/2019 FINDINGS: Cardiovascular: Suboptimal opacification of pulmonary arteries. No large central pulmonary embolus identified. Heart size is within normal limits. Mediastinum/Nodes: No enlarged mediastinal, hilar, or axillary lymph nodes. Thyroid gland, trachea, and esophagus demonstrate no significant findings. Lungs/Pleura: Lungs are clear. No pleural effusion or  pneumothorax. Upper Abdomen: 2 cm simple cyst noted at the upper pole of the right kidney. Visualized upper abdominal organs otherwise unremarkable. Musculoskeletal: No chest wall abnormality. No acute or significant osseous findings. Review of the MIP images confirms the above findings. IMPRESSION: Suboptimal opacification of pulmonary arteries. No large central pulmonary embolus identified. Exam nondiagnostic for more distal branches. No acute abnormality of the chest. Electronically Signed   By: Acquanetta BellingFarhaan  Mir M.D.   On: 05/29/2021 13:55   MR BRAIN WO CONTRAST  Result Date: 05/29/2021 CLINICAL DATA:  Bilateral arm numbness and weakness EXAM: MRI HEAD WITHOUT CONTRAST TECHNIQUE: Multiplanar, multiecho pulse sequences of the brain and surrounding structures were obtained without intravenous contrast. COMPARISON:  CT head 11/03/2019 FINDINGS: Brain: There is a remote infarct in the right basal ganglia and external capsule with associated ex vacuo dilatation of the right lateral ventricle, unchanged. Linear DWI signal within the infarct territory in the basal ganglia without convincing low ADC signal is consistent with T2 shine through. T2* signal dropout within the infarct territory is consistent with hemosiderin deposition. Additional smaller infarcts are seen in the right precentral gyrus and parietal lobe cortex and left cerebellar hemisphere. Atrophy of the right cerebral peduncle likely reflects wallerian degeneration. There is no evidence of acute intracranial hemorrhage or infarct. No mass lesion is identified. There is no midline shift. Vascular: The right intracranial ICA flow void is attenuated. The right MCA and ACA flow voids are present. The left ICA flow void is present. Skull and upper cervical spine: Normal marrow signal. Sinuses/Orbits: The paranasal sinuses and mastoid air cells are clear. The globes and orbits are unremarkable. Other: None. IMPRESSION: 1. No acute intracranial hemorrhage or  infarct. 2. Remote infarct in the right basal ganglia and external capsule, unchanged. Additional smaller remote infarcts in the right cerebral hemisphere and left cerebellar hemisphere as above. 3. Attenuation of the right intracranial ICA flow void; this could be further evaluated with CTA or MRA head/neck as indicated. Electronically Signed   By: Lesia HausenPeter  Noone M.D.   On: 05/29/2021 10:35   MR Cervical Spine Wo  Contrast  Result Date: 05/29/2021 CLINICAL DATA:  Numbness, tingling, paresthesia in both arms EXAM: MRI CERVICAL SPINE WITHOUT CONTRAST TECHNIQUE: Multiplanar, multisequence MR imaging of the cervical spine was performed. No intravenous contrast was administered. COMPARISON:  CT cervical spine 11/03/2019 FINDINGS: Alignment: Physiologic. Vertebrae: Vertebral body heights are preserved. Marrow signal is normal. Cord: Normal signal and morphology. Posterior Fossa, vertebral arteries, paraspinal tissues: The intracranial compartment is assessed on the separately dictated brain MRI. The vertebral artery flow voids are present. The paraspinal soft tissues are unremarkable. Disc levels: There is disc desiccation without significant loss of height throughout the cervical spine. At C3-C4, there is a posterior disc osteophyte complex, ligamentum flavum thickening, and mild uncovertebral and facet arthropathy resulting in mild-to-moderate spinal canal stenosis with effacement of the thecal sac without evidence of cord compression. There is mild right and no significant left neural foraminal stenosis. There is no significant spinal canal or neural foraminal stenosis at the remaining levels. IMPRESSION: Mild degenerative changes at C3-C4 as above result in mild-to-moderate spinal canal stenosis without evidence of cord compression or cord signal abnormality and mild right and no significant left neural foraminal stenosis. Otherwise, unremarkable MRI of the cervical spine. Electronically Signed   By: Lesia Hausen  M.D.   On: 05/29/2021 10:41    EKG: Independently reviewed.   Assessment/Plan Principal Problem:   Chest pain Active Problems:   Essential hypertension   Bilateral pulmonary embolism (HCC)   DM (diabetes mellitus), type 2 (HCC)   Tobacco abuse   Type 2 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 disease, with long-term current use of insulin (HCC)   GERD without esophagitis   Hypothyroidism 50 year old female with a history of stroke with left-sided residual deficits weakness, on aspirin and Eliquis per her report, hypertension, hyperlipidemia, diabetes presents with multiple complaints of bilateral upper extremity weakness and chest pain.  Essentially patient right internal carotid artery occlusion, severe stenosis of left vertebral artery, stenosis/occlusion of left distal PCA and needs to follow-up with neurology as an outpatient. Chest pain rule out ACS, continue telemetry as needed morphine oxygen nitro and aspirin Continue home aspirin and Eliquis Cardiology consult-medical management likely per Dr. Rosemary Holms whom I discussed w/ over the phone Risk factor management, CKD 3 likely contributing as well. Patient had elevated troponins due to PE in February last year Troponin 176 down to 151  Diabetes type 2-follow-up blood sugar checks Sliding scale insulin follow A1c  Hyperlipidemia-continue statin follow-up lipid panel  Hypertension-continue home Cozaar, metoprolol  Hypothyroidism-continue Synthroid  GERD-continue PPI  Tobacco abuse-encourage cessation  History of PE-continue Eliquis  Disposition-observation, telemetry, continue to monitor  DVT prophylaxis: SCD Code Status:   Full Family Communication:  N/A Disposition Plan:   Patient is from:  SNF  Anticipated DC to:  To determine  Anticipated DC date:  To be determined  Anticipated DC barriers: TBD  Consults called:  Cardiology-Dr. Rosemary Holms Admission status:  Obs, tele  Severity of Illness: The  appropriate patient status for this patient is OBSERVATION. Observation status is judged to be reasonable and necessary in order to provide the required intensity of service to ensure the patient's safety. The patient's presenting symptoms, physical exam findings, and initial radiographic and laboratory data in the context of their medical condition is felt to place them at decreased risk for further clinical deterioration. Furthermore, it is anticipated that the patient will be medically stable for discharge from the hospital within 2 midnights of admission. The following factors support the patient status  of observation.   " The patient's presenting symptoms include as above. " The physical exam findings include as above. " The initial radiographic and laboratory data are as above.   Darnelle Catalan MD [facility] [phone]  How to contact the [facility] Attending or Consulting provider 7A - 7P or covering provider during after hours 7P -7A, for this patient?   Check the care team in [facility] and look for a) attending/consulting [facility] provider listed and b) the [facility] team listed Log into [email or web address] and use [facility] universal password to access. If you do not have the password, please contact the hospital operator. Locate the [facility] provider you are looking for under [facility] and page to a number that you can be directly reached. If you still have difficulty reaching the provider, please page the [facility] (Director on Call) for the Hospitalists listed on amion for assistance.  05/29/2021, 4:27 PM

## 2021-05-29 NOTE — Consult Note (Signed)
CARDIOLOGY CONSULT NOTE  Patient ID: LAVINE HARGROVE MRN: [id number] DOB/AGE: Oct 06, 1971 50 y.o.  Admit date: 05/29/2021 Referring Physician: [facility] hospitalist Reason for Consultation: Chest pain  HPI:   50 y.o. Caucasian female  with hypertension, uncontrolled type 2 diabetes mellitus, hypothyroidism, tobacco dependence, COPD, h/o DVT/PE, h/o stroke.  Cardiology consulted for troponin elevation.  Patient is a nursing home resident.  She presented to the [facility] ER today with 2 days of left arm numbness and pain radiating to her neck and chest.  Symptoms are waxing and waning in nature.  Work-up in the ER showed mildly elevated troponin at 176 and 151.  CT angiogram head inadequate opacification of pulmonary arteries, but without any central PE.  MRI brain and cervical spine were unremarkable for acute stroke or acute compressive etiology.  CT angiogram head and neck showed multifocal atherosclerosis.  Patient was evaluated by neurology and discussed with Neurosurgery.  No acute intervention was felt warranted.  On my interview, patient currently denies any pain. She describes her pain as primarily occurring in dorsum of both her hands, wrist, and then radiating to her arm, neck, and chest. Pain occurs for several hours at once. Pain does not worsen with ambulation. She lives in [facility] nursing home and ambulates using a walker. She has residual left sided weakness since her stroke (occurred while she was in [place]).   Unfortunately, she continues to smoke 1/2 pack per day, states "that's the only thing I can do".   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   Former smoker 36 pack year smoking history     Diabetes mellitus without complication (HCC)    Essential  hypertension 05/04/2009   Qualifier: Diagnosis of  By: Roxan Hockey CMA, Jessica     GERD without esophagitis 02/09/2021   Hypertension    Hypothyroidism 02/09/2021   Mixed 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 (HCC) 02/09/2021   Pulmonary embolism (HCC) 11/16/2019   Stroke (HCC)    Type 2 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 disease, with long-term current use of insulin (HCC) 02/09/2021     History reviewed. No pertinent surgical history.    Family History  Problem Relation Age of Onset   Diabetes Mother    COPD Sister    Heart failure Sister      Social History: Social History   Socioeconomic History   Marital status: Single    Spouse name: Not on file   Number of children: Not on file   Years of education: Not on file   Highest education level: Not on file  Occupational History   Not on file  Tobacco Use   Smoking status: Former    Packs/day: 1.75    Years: 36.00    Pack years: 63.00    Types: Cigarettes    Start date: 10/08/1983    Quit date: 11/22/2019    Years since quitting: 1.5   Smokeless tobacco: Never  Vaping Use   Vaping Use: Every day   Substances: Nicotine  Substance and Sexual Activity   Alcohol use: No   Drug use: No   Sexual activity: Not on file  Other Topics Concern   Not on file  Social History Narrative   Not on file   Social Determinants  of Health   Financial Resource Strain: Not on file  Food Insecurity: Not on file  Transportation Needs: Not on file  Physical Activity: Not on file  Stress: Not on file  Social Connections: Not on file  Intimate Partner Violence: Not on file     (Not in a hospital admission)   Review of Systems  Constitutional: Negative for decreased appetite, malaise/fatigue, weight gain and weight loss.  HENT:  Negative for congestion.   Eyes:  Negative for visual disturbance.  Cardiovascular:  Negative for chest pain, dyspnea on exertion, leg swelling, palpitations and syncope.   Respiratory:  Negative for cough.   Endocrine: Negative for cold intolerance.  Hematologic/Lymphatic: Does not bruise/bleed easily.  Skin:  Negative for itching and rash.  Musculoskeletal:  Negative for myalgias.  Gastrointestinal:  Negative for abdominal pain, nausea and vomiting.  Genitourinary:  Negative for dysuria.  Neurological:  Negative for dizziness and weakness.  Psychiatric/Behavioral:  The patient is not nervous/anxious.   All other systems reviewed and are negative.    Physical Exam: Physical Exam Vitals and nursing note reviewed.  Constitutional:      General: She is not in acute distress.    Appearance: She is well-developed.  HENT:     Head: Normocephalic and atraumatic.  Eyes:     Conjunctiva/sclera: Conjunctivae normal.     Pupils: Pupils are equal, round, and reactive to light.  Neck:     Vascular: No JVD.  Cardiovascular:     Rate and Rhythm: Normal rate and regular rhythm.     Pulses: Intact distal pulses. Decreased pulses.     Heart sounds: No murmur heard. Pulmonary:     Effort: Pulmonary effort is normal.     Breath sounds: Normal breath sounds. No wheezing or rales.  Abdominal:     General: Bowel sounds are normal.     Palpations: Abdomen is soft.     Tenderness: There is no rebound.  Musculoskeletal:        General: No tenderness. Normal range of motion.     Right lower leg: Edema (Trace) present.     Left lower leg: Edema (Trace) present.  Lymphadenopathy:     Cervical: No cervical adenopathy.  Skin:    General: Skin is warm and dry.  Neurological:     Mental Status: She is alert and oriented to person, place, and time.     Cranial Nerves: No cranial nerve deficit.     Motor: Weakness (LUE and LLE) present.     Labs:   Lab Results  Component Value Date   WBC 8.3 05/29/2021   HGB 13.9 05/29/2021   HCT 42.4 05/29/2021   MCV 83.3 05/29/2021   PLT 221 05/29/2021    Recent Labs  Lab 05/29/21 0845  NA 132*  K 5.2*  CL 99  CO2  25  BUN 24*  CREATININE 1.24*  CALCIUM 9.6  PROT 7.9  BILITOT 0.5  ALKPHOS 101  ALT 22  AST 28  GLUCOSE 297*    Lipid Panel     Component Value Date/Time   CHOL 138 11/21/2019 0754   CHOL 226 (H) 11/30/2012 0428   TRIG 179 (H) 11/21/2019 0754   TRIG 518 (H) 11/30/2012 0428   HDL 23 (L) 11/21/2019 0754   HDL 33 (L) 11/30/2012 0428   CHOLHDL 6.0 11/21/2019 0754   VLDL 36 11/21/2019 0754   VLDL SEE COMMENT 11/30/2012 0428   LDLCALC 79 11/21/2019 0754   LDLCALC SEE COMMENT 11/30/2012  0428    BNP (last 3 results) No results for input(s): BNP in the last 8760 hours.  HEMOGLOBIN A1C Lab Results  Component Value Date   HGBA1C 9.9 (H) 02/09/2021   MPG 237.43 02/09/2021    Cardiac Panel (last 3 results) No results for input(s): CKTOTAL, CKMB, RELINDX in the last 8760 hours.  Invalid input(s): TROPONINHS  Lab Results  Component Value Date   CKTOTAL 54 12/10/2012   CKMB 0.7 12/10/2012     TSH Recent Labs    02/12/21 0126  TSH 9.237*      Radiology: CT Angio Head W or Wo Contrast  Result Date: 05/29/2021 CLINICAL DATA:  Pain, numbness, and weakness in bilateral arms EXAM: CT ANGIOGRAPHY HEAD AND NECK TECHNIQUE: Multidetector CT imaging of the head and neck was performed using the standard protocol during bolus administration of intravenous contrast. Multiplanar CT image reconstructions and MIPs were obtained to evaluate the vascular anatomy. Carotid stenosis measurements (when applicable) are obtained utilizing NASCET criteria, using the distal internal carotid diameter as the denominator. CONTRAST:  100mL OMNIPAQUE IOHEXOL 350 MG/ML SOLN COMPARISON:  Same-day brain and cervical spine MRIs, CT head 11/03/2019 FINDINGS: CT HEAD FINDINGS Brain: There is no evidence of acute intracranial hemorrhage, extra-axial fluid collection, or acute infarct. There is a remote infarct in the right basal ganglia/external capsule. Additional smaller remote infarcts are seen in the right  precentral gyrus and parietal lobe cortex. There is unchanged ex vacuo dilatation of the right lateral ventricle. The ventricles are otherwise normal in size and configuration. No mass lesion is identified. There is no midline shift. Vascular: See below Skull: Normal. Negative for fracture or focal lesion. Sinuses: Imaged portions are clear. Orbits: No acute finding. Review of the MIP images confirms the above findings CTA NECK FINDINGS Aortic arch: Standard branching. Imaged portion shows no evidence of aneurysm or dissection. No significant stenosis of the major arch vessel origins. Right carotid system: The right common carotid artery is patent. The internal carotid artery is occluded from shortly after the origin through the supraclinoid segment. Left carotid system: There is soft and calcified atherosclerotic plaque of the left carotid bulb resulting in less than 50% stenosis. There is no evidence of dissection or aneurysm. Vertebral arteries: There is soft plaque throughout the proximal left subclavian artery with involvement of the ostium of the left renal artery with moderate to severe stenosis at the origin. There is marked multifocal stenosis throughout the remainder of the cervical portion of the left vertebral artery. The right vertebral artery is patent throughout. Skeleton: There is degenerative change of the temporomandibular joints, right more than left there is no acute osseous abnormality. Other neck: The soft tissues are unremarkable. There is dental disease involving multiple teeth. Upper chest: The lung apices are clear. Review of the MIP images confirms the above findings CTA HEAD FINDINGS Anterior circulation: The right intracranial internal carotid artery is occluded with reconstitution of flow at the communicating segment likely via collateral flow from the anterior communicating artery. There is a linear filling defect in the cavernous segment of the left ICA (11-201, 12-86). There is no  hemodynamically significant stenosis of the true lumen. The bilateral MCAs and ACAs are patent, though there is relatively diminutive enhancement of the right M1 segment compared to the left. No aneurysm is identified. Posterior circulation: The proximal left V4 segment is moderately stenotic with reconstitution of more normal caliber flow distally, possibly via retrograde flow from the right vertebral artery. The right V4 segment  is patent. The basilar artery is patent. The proximal PCAs are patent. There is multifocal stenosis/occlusion of the left PCA beyond the P1 segment. The right PCA is patent. Venous sinuses: As permitted by contrast timing, patent. Anatomic variants: None. Review of the MIP images confirms the above findings IMPRESSION: 1. 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 2. Multifocal irregularity with diffuse moderate to severe stenosis of the left vertebral artery most suspicious for chronic dissection. The distal left V4 segment is patent, possibly via collateral flow from the right vertebral artery. 3. Linear filling defect in the left cavernous ICA she suspicious for dissection without hemodynamically significant stenosis or occlusion. Correlation with angiogram is recommended if indicated. 4. Irregularity with multifocal severe stenosis/occlusion of the distal left PCA branches. Electronically Signed   By: Lesia Hausen M.D.   On: 05/29/2021 13:59   DG Chest 2 View  Result Date: 05/29/2021 CLINICAL DATA:  Chest pain. EXAM: CHEST - 2 VIEW COMPARISON:  April 01, 2021. FINDINGS: The heart size and mediastinal contours are within normal limits. Both lungs are clear. The visualized skeletal structures are unremarkable. IMPRESSION: No active cardiopulmonary disease. Electronically Signed   By: Lupita Raider M.D.   On: 05/29/2021  09:26   CT Angio Neck W and/or Wo Contrast  Result Date: 05/29/2021 CLINICAL DATA:  Pain, numbness, and weakness in bilateral arms EXAM: CT ANGIOGRAPHY HEAD AND NECK TECHNIQUE: Multidetector CT imaging of the head and neck was performed using the standard protocol during bolus administration of intravenous contrast. Multiplanar CT image reconstructions and MIPs were obtained to evaluate the vascular anatomy. Carotid stenosis measurements (when applicable) are obtained utilizing NASCET criteria, using the distal internal carotid diameter as the denominator. CONTRAST:  OMNIPAQUE IOHEXOL 350 MG/ML SOLN COMPARISON:  Same-day brain and cervical spine MRIs, CT head 11/03/2019 FINDINGS: CT HEAD FINDINGS Brain: There is no evidence of acute intracranial hemorrhage, extra-axial fluid collection, or acute infarct. There is a remote infarct in the right basal ganglia/external capsule. Additional smaller remote infarcts are seen in the right precentral gyrus and parietal lobe cortex. There is unchanged ex vacuo dilatation of the right lateral ventricle. The ventricles are otherwise normal in size and configuration. No mass lesion is identified. There is no midline shift. Vascular: See below Skull: Normal. Negative for fracture or focal lesion. Sinuses: Imaged portions are clear. Orbits: No acute finding. Review of the MIP images confirms the above findings CTA NECK FINDINGS Aortic arch: Standard branching. Imaged portion shows no evidence of aneurysm or dissection. No significant stenosis of the major arch vessel origins. Right carotid system: The right common carotid artery is patent. The internal carotid artery is occluded from shortly after the origin through the supraclinoid segment. Left carotid system: There is soft and calcified atherosclerotic plaque of the left carotid bulb resulting in less than 50% stenosis. There is no evidence of dissection or aneurysm. Vertebral arteries: There is soft plaque throughout  the proximal left subclavian artery with involvement of the ostium of the left renal artery with moderate to severe stenosis at the origin. There is marked multifocal stenosis throughout the remainder of the cervical portion of the left vertebral artery. The right vertebral artery is patent throughout. Skeleton: There is degenerative change of the temporomandibular joints, right more than left there is no acute osseous abnormality. Other neck: The soft tissues  are unremarkable. There is dental disease involving multiple teeth. Upper chest: The lung apices are clear. Review of the MIP images confirms the above findings CTA HEAD FINDINGS Anterior circulation: The right intracranial internal carotid artery is occluded with reconstitution of flow at the communicating segment likely via collateral flow from the anterior communicating artery. There is a linear filling defect in the cavernous segment of the left ICA (11-201, 12-86). There is no hemodynamically significant stenosis of the true lumen. The bilateral MCAs and ACAs are patent, though there is relatively diminutive enhancement of the right M1 segment compared to the left. No aneurysm is identified. Posterior circulation: The proximal left V4 segment is moderately stenotic with reconstitution of more normal caliber flow distally, possibly via retrograde flow from the right vertebral artery. The right V4 segment is patent. The basilar artery is patent. The proximal PCAs are patent. There is multifocal stenosis/occlusion of the left PCA beyond the P1 segment. The right PCA is patent. Venous sinuses: As permitted by contrast timing, patent. Anatomic variants: None. Review of the MIP images confirms the above findings IMPRESSION: 1. 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 2. Multifocal irregularity with  diffuse moderate to severe stenosis of the left vertebral artery most suspicious for chronic dissection. The distal left V4 segment is patent, possibly via collateral flow from the right vertebral artery. 3. Linear filling defect in the left cavernous ICA she suspicious for dissection without hemodynamically significant stenosis or occlusion. Correlation with angiogram is recommended if indicated. 4. Irregularity with multifocal severe stenosis/occlusion of the distal left PCA branches. Electronically Signed   By: Lesia Hausen M.D.   On: 05/29/2021 13:59   CT Angio Chest PE W and/or Wo Contrast  Result Date: 05/29/2021 CLINICAL DATA:  Right hand pain radiating up arm into chest. EXAM: CT ANGIOGRAPHY CHEST WITH CONTRAST TECHNIQUE: Multidetector CT imaging of the chest was performed using the standard protocol during bolus administration of intravenous contrast. Multiplanar CT image reconstructions and MIPs were obtained to evaluate the vascular anatomy. CONTRAST:  OMNIPAQUE IOHEXOL 350 MG/ML SOLN COMPARISON:  11/15/2019 FINDINGS: Cardiovascular: Suboptimal opacification of pulmonary arteries. No large central pulmonary embolus identified. Heart size is within normal limits. Mediastinum/Nodes: No enlarged mediastinal, hilar, or axillary lymph nodes. Thyroid gland, trachea, and esophagus demonstrate no significant findings. Lungs/Pleura: Lungs are clear. No pleural effusion or pneumothorax. Upper Abdomen: 2 cm simple cyst noted at the upper pole of the right kidney. Visualized upper abdominal organs otherwise unremarkable. Musculoskeletal: No chest wall abnormality. No acute or significant osseous findings. Review of the MIP images confirms the above findings. IMPRESSION: Suboptimal opacification of pulmonary arteries. No large central pulmonary embolus identified. Exam nondiagnostic for more distal branches. No acute abnormality of the chest. Electronically Signed   By: Acquanetta Belling M.D.   On: 05/29/2021  13:55   MR BRAIN WO CONTRAST  Result Date: 05/29/2021 CLINICAL DATA:  Bilateral arm numbness and weakness EXAM: MRI HEAD WITHOUT CONTRAST TECHNIQUE: Multiplanar, multiecho pulse sequences of the brain and surrounding structures were obtained without intravenous contrast. COMPARISON:  CT head 11/03/2019 FINDINGS: Brain: There is a remote infarct in the right basal ganglia and external capsule with associated ex vacuo dilatation of the right lateral ventricle, unchanged. Linear DWI signal within the infarct territory in the basal ganglia without convincing low ADC signal is consistent with T2 shine  through. T2* signal dropout within the infarct territory is consistent with hemosiderin deposition. Additional smaller infarcts are seen in the right precentral gyrus and parietal lobe cortex and left cerebellar hemisphere. Atrophy of the right cerebral peduncle likely reflects wallerian degeneration. There is no evidence of acute intracranial hemorrhage or infarct. No mass lesion is identified. There is no midline shift. Vascular: The right intracranial ICA flow void is attenuated. The right MCA and ACA flow voids are present. The left ICA flow void is present. Skull and upper cervical spine: Normal marrow signal. Sinuses/Orbits: The paranasal sinuses and mastoid air cells are clear. The globes and orbits are unremarkable. Other: None. IMPRESSION: 1. No acute intracranial hemorrhage or infarct. 2. Remote infarct in the right basal ganglia and external capsule, unchanged. Additional smaller remote infarcts in the right cerebral hemisphere and left cerebellar hemisphere as above. 3. Attenuation of the right intracranial ICA flow void; this could be further evaluated with CTA or MRA head/neck as indicated. Electronically Signed   By: Lesia Hausen M.D.   On: 05/29/2021 10:35   MR Cervical Spine Wo Contrast  Result Date: 05/29/2021 CLINICAL DATA:  Numbness, tingling, paresthesia in both arms EXAM: MRI CERVICAL SPINE  WITHOUT CONTRAST TECHNIQUE: Multiplanar, multisequence MR imaging of the cervical spine was performed. No intravenous contrast was administered. COMPARISON:  CT cervical spine 11/03/2019 FINDINGS: Alignment: Physiologic. Vertebrae: Vertebral body heights are preserved. Marrow signal is normal. Cord: Normal signal and morphology. Posterior Fossa, vertebral arteries, paraspinal tissues: The intracranial compartment is assessed on the separately dictated brain MRI. The vertebral artery flow voids are present. The paraspinal soft tissues are unremarkable. Disc levels: There is disc desiccation without significant loss of height throughout the cervical spine. At C3-C4, there is a posterior disc osteophyte complex, ligamentum flavum thickening, and mild uncovertebral and facet arthropathy resulting in mild-to-moderate spinal canal stenosis with effacement of the thecal sac without evidence of cord compression. There is mild right and no significant left neural foraminal stenosis. There is no significant spinal canal or neural foraminal stenosis at the remaining levels. IMPRESSION: Mild degenerative changes at C3-C4 as above result in mild-to-moderate spinal canal stenosis without evidence of cord compression or cord signal abnormality and mild right and no significant left neural foraminal stenosis. Otherwise, unremarkable MRI of the cervical spine. Electronically Signed   By: Lesia Hausen M.D.   On: 05/29/2021 10:41    Scheduled Meds:  apixaban  5 mg Oral Q12H   [START ON 05/30/2021] aspirin  81 mg Oral q AM   atorvastatin  80 mg Oral Daily   chlorhexidine  15 mL Mouth/Throat Daily   folic acid  1 mg Oral Daily   gabapentin  300 mg Oral TID   insulin aspart  0-15 Units Subcutaneous TID WC   insulin glargine-yfgn  10 Units Subcutaneous QHS   [START ON 05/30/2021] levothyroxine  75 mcg Oral QAC breakfast   losartan  75 mg Oral Daily   metoprolol tartrate  25 mg Oral BID   mometasone-formoterol  2 puff  Inhalation BID   omega-3 acid ethyl esters  1 g Oral Daily   pantoprazole  40 mg Oral Daily   senna-docusate  2 tablet Oral BID   [START ON 05/30/2021] vitamin B-12  500 mcg Oral Daily   Continuous Infusions: PRN Meds:.acetaminophen **OR** acetaminophen, albuterol, HYDROcodone-acetaminophen, hydrOXYzine, morphine injection, ondansetron **OR** ondansetron (ZOFRAN) IV  CARDIAC STUDIES:  EKG 05/29/2021: Sinus rhythm 93 bpm Old anteroseptal infarc Low voltage  Echocardiogram 11/16/2019:  1. Left  ventricular ejection fraction, by estimation, is 65 to 70%. The  left ventricle has normal function. The left ventrical has no regional  wall motion abnormalities. There is mildly increased left ventricular  hypertrophy. Left ventricular diastolic  parameters are indeterminate. Elevated left ventricular end-diastolic  pressure.   2. Right ventricular systolic function is severely reduced. The right  ventricular size is moderately enlarged. There is mildly elevated  pulmonary artery systolic pressure. The estimated right ventricular  systolic pressure is 37.2 mmHg.   3. Right atrial size was mild to moderately dilated.   4. No evidence of mitral valve regurgitation.   5. The aortic valve is normal in structure and function. Aortic valve  regurgitation is not visualized. No aortic stenosis is present.   6. The inferior vena cava is normal in size with <50% respiratory  variability, suggesting right atrial pressure of 8 mmHg.    Assessment & Recommendations:  50 y.o. Caucasian female  with hypertension, uncontrolled type 2 diabetes mellitus, hypothyroidism, tobacco dependence, COPD, h/o DVT/PE, h/o stroke.  Cardiology consulted for troponin elevation.  Troponin elevation: Mild elevation without clear rise and fall. Likely type 2 MI in the setting of uncontrolled hypertension. Her pain story is very unlikely to be that of ACS, and appears more likely to be musculoskeletal in origin. She has had  elevated troponin in the past, during her DVT/PE diagnosis in 11/2019. CTA PE was limited in its evaluation today, but rule out central PE. Given her profound vascular history, she likely has some degree of CAD, but this presentation unlikely to be ACS. I recommend nuclear stress testing to evaluate for any obstructive CAD. Will also check echocardiogram.   She is currently on Aspirin, eliquis, as well as Naproxen. Recommend avoiding NSAIDS.  Continue lipitor 80 mg,  cozaar 50 mg. Increase metoprolol tartrate from 25 mg bid to 50 mg bid.  Recommend aggressive risk factor management for hypertension, diabetes, hyperlipidemia.     Elder Negus, MD Pager: [phone] Office: [phone]

## 2021-05-29 NOTE — ED Notes (Signed)
Dr Arnell Sieving at the bedside

## 2021-05-29 NOTE — ED Notes (Signed)
Awaiting for admission- room assignment [id number]- currently the room is being cleaned

## 2021-05-29 NOTE — ED Notes (Signed)
Patient to CT.

## 2021-05-29 NOTE — ED Notes (Signed)
Patient to MRI.

## 2021-05-29 NOTE — ED Triage Notes (Signed)
Pt complains of right hand pain that radiates up her arm and to her chest. This pain has been going on for a while now.

## 2021-05-29 NOTE — ED Provider Notes (Signed)
[facility]-EMERGENCY DEPT Provider Note   CSN: [id number] Arrival date & time: 05/29/21  0014     History Chief Complaint  Patient presents with   Chest Pain   Hand Pain   Arm Pain    Karla Hoover is a 50 y.o. female.  The history is provided by the patient and medical records. No language interpreter was used.  Chest Pain Pain location:  Substernal area Pain quality: aching and sharp   Pain radiates to:  Neck, L shoulder, R shoulder, L arm and R arm Pain severity:  Moderate Onset quality:  Gradual Duration:  9 days Timing:  Constant Progression:  Waxing and waning Chronicity:  New Context: not trauma   Relieved by:  Nothing Worsened by:  Nothing Ineffective treatments:  None tried Associated symptoms: back pain, numbness and weakness   Associated symptoms: no abdominal pain, no altered mental status, no anxiety, no cough, no diaphoresis, no dizziness, no fatigue, no fever, no headache, no lower extremity edema, no nausea, no palpitations, no shortness of breath and no vomiting   Hand Pain Associated symptoms include chest pain. Pertinent negatives include no abdominal pain, no headaches and no shortness of breath.  Arm Pain Associated symptoms include chest pain. Pertinent negatives include no abdominal pain, no headaches and no shortness of breath.      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   Former smoker 36 pack year smoking history     Diabetes mellitus without complication (HCC)    Essential hypertension 05/04/2009   Qualifier: Diagnosis of  By: Roxan Hockey CMA, Jessica     GERD without esophagitis 02/09/2021   Hypertension    Hypothyroidism 02/09/2021   Mixed diabetic hyperlipidemia associated with type 2 diabetes  mellitus (HCC) 02/09/2021   Pulmonary embolism (HCC) 11/16/2019   Stroke (HCC)    Type 2 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 disease, with long-term current use of insulin (HCC) 02/09/2021    Patient Active Problem List   Diagnosis Date Noted   Acute renal failure superimposed on stage 3a chronic kidney disease (HCC) 02/09/2021   Chronic respiratory failure with hypoxia (HCC) 02/09/2021   History of thromboembolism 02/09/2021   Type 2 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 disease, with long-term current use of insulin (HCC) 02/09/2021   Mixed 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 (HCC) 02/09/2021   GERD without esophagitis 02/09/2021   Hypothyroidism 02/09/2021   Pyelonephritis of right kidney 02/09/2021   Sepsis secondary to UTI (HCC) 02/08/2021   Lymphadenopathy 12/28/2020   VTE (venous thromboembolism) 12/01/2019   Overweight (BMI 25.0-29.9) 11/22/2019   Chronic constipation 11/22/2019   Tobacco abuse 11/16/2019   Elevated troponin 11/16/2019   Bilateral pulmonary embolism (HCC) 11/15/2019   DM (diabetes mellitus), type 2 (HCC) 11/15/2019   History of stroke 11/15/2019   Essential hypertension 05/04/2009   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 No pertinent surgical history.   OB History   No obstetric history on file.     Family History  Problem Relation Age of Onset   Diabetes Mother    COPD Sister    Heart failure Sister     Social History   Tobacco Use   Smoking status: Former  Packs/day: 1.75    Years: 36.00    Pack years: 63.00    Types: Cigarettes    Start date: 10/08/1983    Quit date: 11/22/2019    Years since quitting: 1.5   Smokeless tobacco: Never  Vaping Use   Vaping Use: Every day   Substances: Nicotine  Substance Use Topics   Alcohol use: No   Drug use: No    Home Medications Prior to Admission medications   Medication Sig Start Date End Date Taking? Authorizing  Provider  albuterol (VENTOLIN HFA) 108 (90 Base) MCG/ACT inhaler Inhale 2 puffs into the lungs every 6 (six) hours as needed for wheezing or shortness of breath. Patient taking differently: Inhale 2 puffs into the lungs every 6 (six) hours as needed for wheezing or shortness of breath ("COPD"). 10/04/19   Grayce Sessions, NP  apixaban (ELIQUIS) 5 MG TABS tablet Take 2 tablets (10mg ) twice daily for 7 days, then 1 tablet (5mg ) twice daily Patient taking differently: Take 5 mg by mouth every 12 (twelve) hours. 11/21/19   , DO  aspirin 81 MG chewable tablet Chew 81 mg by mouth in the morning.    [provider]  atorvastatin (LIPITOR) 80 MG tablet Take 1 tablet (80 mg total) by mouth daily. Patient taking differently: Take 80 mg by mouth every evening. 10/04/19   Darlin Drop, NP  azithromycin (ZITHROMAX) 250 MG tablet Take 1 tablet (250 mg total) by mouth daily. Take first 2 tablets together, then 1 every day until finished. Patient not taking: Reported on 04/01/2021 03/10/21   04/03/2021, MD  cephALEXin (KEFLEX) 500 MG capsule Take 2 capsules (1,000 mg total) by mouth 2 (two) times daily. 04/01/21   Alvira Monday, MD  COZAAR 50 MG tablet Take 50 mg by mouth daily.    [provider]  cyclobenzaprine (FLEXERIL) 10 MG tablet Take 1 tablet (10 mg total) by mouth 3 (three) times daily as needed for muscle spasms. 10/04/19   Arby Barrette, NP  dicyclomine (BENTYL) 20 MG tablet Take 1 tablet (20 mg total) by mouth 3 (three) times daily as needed for spasms (abdominal pain). 03/10/21   Grayce Sessions, MD  docusate sodium (COLACE) 100 MG capsule Take 100 mg by mouth 2 (two) times daily as needed for constipation. 08/23/19   [provider]  folic acid (FOLVITE) 800 MCG tablet Take 800 mcg by mouth daily.    [provider]  gabapentin (NEURONTIN) 300 MG capsule Take 1 capsule (300 mg total) by mouth 3 (three) times daily. 11/11/19 04/01/21   01/09/20, NP  HYDROcodone-acetaminophen (NORCO/VICODIN) 5-325 MG tablet Take 2 tablets by mouth every 6 (six) hours as needed. 04/01/21   Grayce Sessions, MD  Insulin Glargine (BASAGLAR KWIKPEN) 100 UNIT/ML Inject 20 Units into the skin daily. Patient taking differently: Inject 27 Units into the skin daily. 02/12/21   Arby Barrette, MD  insulin lispro (HUMALOG) 100 UNIT/ML injection Inject 2-14 Units into the skin 2 (two) times daily. Per sliding scale: CBG 201-250 = 2 Units, 251-300 = 4 units,301-350 = 6 units, 351-400 = 8 units, 401-450 = 10 units, 451-500 = 12 units, 501 = 14 units, recheck in 2 hours.    [provider]  levothyroxine (SYNTHROID) 75 MCG tablet Take 75 mcg by mouth daily before breakfast.    [provider]  lidocaine (LIDODERM) 5 % Place 1 patch onto the skin daily. Remove & Discard patch within 12 hours or  as directed by MD 04/01/21   Arby Barrette, MD  linagliptin (TRADJENTA) 5 MG TABS tablet Take 5 mg by mouth daily.    [provider]  metoprolol tartrate (LOPRESSOR) 25 MG tablet Take 1 tablet (25 mg total) by mouth 2 (two) times daily. 10/04/19   Grayce Sessions, NP  omeprazole (PRILOSEC) 20 MG capsule Take 20 mg by mouth daily.    [provider]  phenazopyridine (PYRIDIUM) 200 MG tablet Take 200 mg by mouth 2 (two) times daily. For 3 days 03/31/21   [provider]  polyethylene glycol (MIRALAX / GLYCOLAX) 17 g packet Take 17 g by mouth 2 (two) times daily as needed. Patient taking differently: Take 17 g by mouth 2 (two) times daily as needed for mild constipation. 09/09/19   Dartha Lodge, PA-C  Probiotic Product (PROBIOTIC-10 PO) Take 1 capsule by mouth daily. For 10 days 03/29/21   [provider]  promethazine (PHENERGAN) 25 MG tablet Take 1 tablet (25 mg total) by mouth every 6 (six) hours as needed for nausea or vomiting. 03/10/21   Alvira Monday, MD  senna-docusate (SENOKOT-S) 8.6-50 MG tablet Take 2  tablets by mouth 2 (two) times daily. Patient taking differently: Take 2 tablets by mouth 2 (two) times daily as needed for mild constipation. 02/12/21   Kathlen Mody, MD  SYMBICORT 80-4.5 MCG/ACT inhaler Inhale 2 puffs into the lungs 2 (two) times daily.    [provider]  vitamin B-12 (CYANOCOBALAMIN) 500 MCG tablet Take 500 mcg by mouth daily.    [provider]    Allergies    Guaifenesin, Kiwi extract, Levaquin [levofloxacin in d5w], Strawberry extract, and Baclofen  Review of Systems   Review of Systems  Constitutional:  Negative for chills, diaphoresis, fatigue and fever.  HENT:  Negative for congestion.   Eyes:  Negative for visual disturbance.  Respiratory:  Negative for cough, chest tightness, shortness of breath and wheezing.   Cardiovascular:  Positive for chest pain. Negative for palpitations.  Gastrointestinal:  Negative for abdominal pain, constipation, diarrhea, nausea and vomiting.  Genitourinary:  Negative for flank pain.  Musculoskeletal:  Positive for back pain and neck pain. Negative for neck stiffness.  Skin:  Negative for rash and wound.  Neurological:  Positive for weakness and numbness. Negative for dizziness, speech difficulty, light-headedness and headaches.  Psychiatric/Behavioral:  Negative for agitation.   All other systems reviewed and are negative.  Physical Exam Updated Vital Signs BP (!) 175/108 (BP Location: Left Arm)   Pulse (!) 107   Temp 98.3 F (36.8 C) (Oral)   Resp 14   Ht 5' 7.5" (1.715 m)   Wt 89.4 kg   LMP 10/20/2019   SpO2 96%   BMI 30.40 kg/m   Physical Exam Vitals and nursing note reviewed.  Constitutional:      General: She is not in acute distress.    Appearance: She is well-developed.  HENT:     Head: Normocephalic and atraumatic.  Eyes:     Extraocular Movements: Extraocular movements intact.     Conjunctiva/sclera: Conjunctivae normal.     Pupils: Pupils are equal, round, and reactive to light.   Cardiovascular:     Rate and Rhythm: Normal rate and regular rhythm.     Heart sounds: No murmur heard. Pulmonary:     Effort: Pulmonary effort is normal. No respiratory distress.     Breath sounds: Normal breath sounds. No wheezing, rhonchi or rales.  Chest:  Chest wall: Tenderness present.  Abdominal:     Palpations: Abdomen is soft.     Tenderness: There is no abdominal tenderness.  Musculoskeletal:     Cervical back: Neck supple.     Right lower leg: No tenderness. No edema.     Left lower leg: No tenderness. No edema.  Skin:    General: Skin is warm and dry.  Neurological:     Mental Status: She is alert.     Cranial Nerves: Facial asymmetry present.     Sensory: Sensory deficit present.     Motor: Weakness and abnormal muscle tone present.     Comments: Weakness in left face, left arm, left leg at baseline.  New numbness and tingling in bilateral upper extremities but sparing the legs.  Also new subjective weakness in the right arm compared to her baseline although it is much stronger than the left side.  Pupils have normal extraocular movements.  Clear speech.  Psychiatric:        Mood and Affect: Mood normal.    ED Results / Procedures / Treatments   Labs (all labs ordered are listed, but only abnormal results are displayed) Labs Reviewed  COMPREHENSIVE METABOLIC PANEL - Abnormal; Notable for the following components:      Result Value   Sodium 132 (*)    Potassium 5.2 (*)    Glucose, Bld 297 (*)    BUN 24 (*)    Creatinine, Ser 1.24 (*)    GFR, Estimated 53 (*)    All other components within normal limits  TROPONIN I (HIGH SENSITIVITY) - Abnormal; Notable for the following components:   Troponin I (High Sensitivity) 176 (*)    All other components within normal limits  TROPONIN I (HIGH SENSITIVITY) - Abnormal; Notable for the following components:   Troponin I (High Sensitivity) 151 (*)    All other components within normal limits  CBC WITH  DIFFERENTIAL/PLATELET    EKG EKG Interpretation  Date/Time:  Tuesday May 29 2021 00:50:01 EDT Ventricular Rate:  93 PR Interval:  188 QRS Duration: 85 QT Interval:  362 QTC Calculation: 451 R Axis:   56 Text Interpretation: Sinus rhythm Low voltage, precordial leads Anteroseptal infarct, old 12 Lead; Mason-Likar When compared to prior, similar appearance. No STEMI Confirmed by Theda Belfastegeler, Chris ([id number]) on 05/29/2021 7:33:26 AM  Radiology CT Angio Head W or Wo Contrast  Result Date: 05/29/2021 CLINICAL DATA:  Pain, numbness, and weakness in bilateral arms EXAM: CT ANGIOGRAPHY HEAD AND NECK TECHNIQUE: Multidetector CT imaging of the head and neck was performed using the standard protocol during bolus administration of intravenous contrast. Multiplanar CT image reconstructions and MIPs were obtained to evaluate the vascular anatomy. Carotid stenosis measurements (when applicable) are obtained utilizing NASCET criteria, using the distal internal carotid diameter as the denominator. CONTRAST:  100mL OMNIPAQUE IOHEXOL 350 MG/ML SOLN COMPARISON:  Same-day brain and cervical spine MRIs, CT head 11/03/2019 FINDINGS: CT HEAD FINDINGS Brain: There is no evidence of acute intracranial hemorrhage, extra-axial fluid collection, or acute infarct. There is a remote infarct in the right basal ganglia/external capsule. Additional smaller remote infarcts are seen in the right precentral gyrus and parietal lobe cortex. There is unchanged ex vacuo dilatation of the right lateral ventricle. The ventricles are otherwise normal in size and configuration. No mass lesion is identified. There is no midline shift. Vascular: See below Skull: Normal. Negative for fracture or focal lesion. Sinuses: Imaged portions are clear. Orbits: No acute finding. Review of the  MIP images confirms the above findings CTA NECK FINDINGS Aortic arch: Standard branching. Imaged portion shows no evidence of aneurysm or dissection. No significant  stenosis of the major arch vessel origins. Right carotid system: The right common carotid artery is patent. The internal carotid artery is occluded from shortly after the origin through the supraclinoid segment. Left carotid system: There is soft and calcified atherosclerotic plaque of the left carotid bulb resulting in less than 50% stenosis. There is no evidence of dissection or aneurysm. Vertebral arteries: There is soft plaque throughout the proximal left subclavian artery with involvement of the ostium of the left renal artery with moderate to severe stenosis at the origin. There is marked multifocal stenosis throughout the remainder of the cervical portion of the left vertebral artery. The right vertebral artery is patent throughout. Skeleton: There is degenerative change of the temporomandibular joints, right more than left there is no acute osseous abnormality. Other neck: The soft tissues are unremarkable. There is dental disease involving multiple teeth. Upper chest: The lung apices are clear. Review of the MIP images confirms the above findings CTA HEAD FINDINGS Anterior circulation: The right intracranial internal carotid artery is occluded with reconstitution of flow at the communicating segment likely via collateral flow from the anterior communicating artery. There is a linear filling defect in the cavernous segment of the left ICA (11-201, 12-86). There is no hemodynamically significant stenosis of the true lumen. The bilateral MCAs and ACAs are patent, though there is relatively diminutive enhancement of the right M1 segment compared to the left. No aneurysm is identified. Posterior circulation: The proximal left V4 segment is moderately stenotic with reconstitution of more normal caliber flow distally, possibly via retrograde flow from the right vertebral artery. The right V4 segment is patent. The basilar artery is patent. The proximal PCAs are patent. There is multifocal stenosis/occlusion of the  left PCA beyond the P1 segment. The right PCA is patent. Venous sinuses: As permitted by contrast timing, patent. Anatomic variants: None. Review of the MIP images confirms the above findings IMPRESSION: 1. 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 2. Multifocal irregularity with diffuse moderate to severe stenosis of the left vertebral artery most suspicious for chronic dissection. The distal left V4 segment is patent, possibly via collateral flow from the right vertebral artery. 3. Linear filling defect in the left cavernous ICA she suspicious for dissection without hemodynamically significant stenosis or occlusion. Correlation with angiogram is recommended if indicated. 4. Irregularity with multifocal severe stenosis/occlusion of the distal left PCA branches. Electronically Signed   By: Lesia Hausen M.D.   On: 05/29/2021 13:59   DG Chest 2 View  Result Date: 05/29/2021 CLINICAL DATA:  Chest pain. EXAM: CHEST - 2 VIEW COMPARISON:  April 01, 2021. FINDINGS: The heart size and mediastinal contours are within normal limits. Both lungs are clear. The visualized skeletal structures are unremarkable. IMPRESSION: No active cardiopulmonary disease. Electronically Signed   By: Lupita Raider M.D.   On: 05/29/2021 09:26   CT Angio Neck W and/or Wo Contrast  Result Date: 05/29/2021 CLINICAL DATA:  Pain, numbness, and weakness in bilateral arms EXAM: CT ANGIOGRAPHY HEAD AND NECK TECHNIQUE: Multidetector CT imaging of the head and neck was performed using the standard protocol during bolus administration of intravenous contrast. Multiplanar CT image reconstructions and MIPs were obtained to evaluate the  vascular anatomy. Carotid stenosis measurements (when applicable) are obtained utilizing NASCET criteria, using the distal internal carotid diameter as the  denominator. CONTRAST:  OMNIPAQUE IOHEXOL 350 MG/ML SOLN COMPARISON:  Same-day brain and cervical spine MRIs, CT head 11/03/2019 FINDINGS: CT HEAD FINDINGS Brain: There is no evidence of acute intracranial hemorrhage, extra-axial fluid collection, or acute infarct. There is a remote infarct in the right basal ganglia/external capsule. Additional smaller remote infarcts are seen in the right precentral gyrus and parietal lobe cortex. There is unchanged ex vacuo dilatation of the right lateral ventricle. The ventricles are otherwise normal in size and configuration. No mass lesion is identified. There is no midline shift. Vascular: See below Skull: Normal. Negative for fracture or focal lesion. Sinuses: Imaged portions are clear. Orbits: No acute finding. Review of the MIP images confirms the above findings CTA NECK FINDINGS Aortic arch: Standard branching. Imaged portion shows no evidence of aneurysm or dissection. No significant stenosis of the major arch vessel origins. Right carotid system: The right common carotid artery is patent. The internal carotid artery is occluded from shortly after the origin through the supraclinoid segment. Left carotid system: There is soft and calcified atherosclerotic plaque of the left carotid bulb resulting in less than 50% stenosis. There is no evidence of dissection or aneurysm. Vertebral arteries: There is soft plaque throughout the proximal left subclavian artery with involvement of the ostium of the left renal artery with moderate to severe stenosis at the origin. There is marked multifocal stenosis throughout the remainder of the cervical portion of the left vertebral artery. The right vertebral artery is patent throughout. Skeleton: There is degenerative change of the temporomandibular joints, right more than left there is no acute osseous abnormality. Other neck: The soft tissues are unremarkable. There is dental disease involving multiple teeth. Upper chest: The lung  apices are clear. Review of the MIP images confirms the above findings CTA HEAD FINDINGS Anterior circulation: The right intracranial internal carotid artery is occluded with reconstitution of flow at the communicating segment likely via collateral flow from the anterior communicating artery. There is a linear filling defect in the cavernous segment of the left ICA (11-201, 12-86). There is no hemodynamically significant stenosis of the true lumen. The bilateral MCAs and ACAs are patent, though there is relatively diminutive enhancement of the right M1 segment compared to the left. No aneurysm is identified. Posterior circulation: The proximal left V4 segment is moderately stenotic with reconstitution of more normal caliber flow distally, possibly via retrograde flow from the right vertebral artery. The right V4 segment is patent. The basilar artery is patent. The proximal PCAs are patent. There is multifocal stenosis/occlusion of the left PCA beyond the P1 segment. The right PCA is patent. Venous sinuses: As permitted by contrast timing, patent. Anatomic variants: None. Review of the MIP images confirms the above findings IMPRESSION: 1. 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 2. Multifocal irregularity with diffuse moderate to severe stenosis of the left vertebral artery most suspicious for chronic dissection. The distal left V4 segment is patent, possibly via collateral flow from the right vertebral artery. 3. Linear filling defect in the left cavernous ICA she suspicious for dissection without hemodynamically significant stenosis or occlusion. Correlation with angiogram is recommended if indicated. 4. Irregularity with multifocal severe stenosis/occlusion of the distal left PCA branches. Electronically Signed  By: Lesia Hausen M.D.   On: 05/29/2021  13:59   CT Angio Chest PE W and/or Wo Contrast  Result Date: 05/29/2021 CLINICAL DATA:  Right hand pain radiating up arm into chest. EXAM: CT ANGIOGRAPHY CHEST WITH CONTRAST TECHNIQUE: Multidetector CT imaging of the chest was performed using the standard protocol during bolus administration of intravenous contrast. Multiplanar CT image reconstructions and MIPs were obtained to evaluate the vascular anatomy. CONTRAST:  OMNIPAQUE IOHEXOL 350 MG/ML SOLN COMPARISON:  11/15/2019 FINDINGS: Cardiovascular: Suboptimal opacification of pulmonary arteries. No large central pulmonary embolus identified. Heart size is within normal limits. Mediastinum/Nodes: No enlarged mediastinal, hilar, or axillary lymph nodes. Thyroid gland, trachea, and esophagus demonstrate no significant findings. Lungs/Pleura: Lungs are clear. No pleural effusion or pneumothorax. Upper Abdomen: 2 cm simple cyst noted at the upper pole of the right kidney. Visualized upper abdominal organs otherwise unremarkable. Musculoskeletal: No chest wall abnormality. No acute or significant osseous findings. Review of the MIP images confirms the above findings. IMPRESSION: Suboptimal opacification of pulmonary arteries. No large central pulmonary embolus identified. Exam nondiagnostic for more distal branches. No acute abnormality of the chest. Electronically Signed   By: Acquanetta Belling M.D.   On: 05/29/2021 13:55   MR BRAIN WO CONTRAST  Result Date: 05/29/2021 CLINICAL DATA:  Bilateral arm numbness and weakness EXAM: MRI HEAD WITHOUT CONTRAST TECHNIQUE: Multiplanar, multiecho pulse sequences of the brain and surrounding structures were obtained without intravenous contrast. COMPARISON:  CT head 11/03/2019 FINDINGS: Brain: There is a remote infarct in the right basal ganglia and external capsule with associated ex vacuo dilatation of the right lateral ventricle, unchanged. Linear DWI signal within the infarct territory in the basal ganglia without  convincing low ADC signal is consistent with T2 shine through. T2* signal dropout within the infarct territory is consistent with hemosiderin deposition. Additional smaller infarcts are seen in the right precentral gyrus and parietal lobe cortex and left cerebellar hemisphere. Atrophy of the right cerebral peduncle likely reflects wallerian degeneration. There is no evidence of acute intracranial hemorrhage or infarct. No mass lesion is identified. There is no midline shift. Vascular: The right intracranial ICA flow void is attenuated. The right MCA and ACA flow voids are present. The left ICA flow void is present. Skull and upper cervical spine: Normal marrow signal. Sinuses/Orbits: The paranasal sinuses and mastoid air cells are clear. The globes and orbits are unremarkable. Other: None. IMPRESSION: 1. No acute intracranial hemorrhage or infarct. 2. Remote infarct in the right basal ganglia and external capsule, unchanged. Additional smaller remote infarcts in the right cerebral hemisphere and left cerebellar hemisphere as above. 3. Attenuation of the right intracranial ICA flow void; this could be further evaluated with CTA or MRA head/neck as indicated. Electronically Signed   By: Lesia Hausen M.D.   On: 05/29/2021 10:35   MR Cervical Spine Wo Contrast  Result Date: 05/29/2021 CLINICAL DATA:  Numbness, tingling, paresthesia in both arms EXAM: MRI CERVICAL SPINE WITHOUT CONTRAST TECHNIQUE: Multiplanar, multisequence MR imaging of the cervical spine was performed. No intravenous contrast was administered. COMPARISON:  CT cervical spine 11/03/2019 FINDINGS: Alignment: Physiologic. Vertebrae: Vertebral body heights are preserved. Marrow signal is normal. Cord: Normal signal and morphology. Posterior Fossa, vertebral arteries, paraspinal tissues: The intracranial compartment is assessed on the separately dictated brain MRI. The vertebral artery flow voids are present. The paraspinal soft tissues are unremarkable.  Disc levels: There is disc desiccation without significant loss of height throughout the cervical spine. At C3-C4, there is  a posterior disc osteophyte complex, ligamentum flavum thickening, and mild uncovertebral and facet arthropathy resulting in mild-to-moderate spinal canal stenosis with effacement of the thecal sac without evidence of cord compression. There is mild right and no significant left neural foraminal stenosis. There is no significant spinal canal or neural foraminal stenosis at the remaining levels. IMPRESSION: Mild degenerative changes at C3-C4 as above result in mild-to-moderate spinal canal stenosis without evidence of cord compression or cord signal abnormality and mild right and no significant left neural foraminal stenosis. Otherwise, unremarkable MRI of the cervical spine. Electronically Signed   By: Lesia Hausen M.D.   On: 05/29/2021 10:41    Procedures Procedures   Medications Ordered in ED Medications  LORazepam (ATIVAN) injection 1 mg (1 mg Intravenous Given 05/29/21 0909)  sodium chloride 0.9 % bolus 1,000 mL (0 mLs Intravenous Stopped 05/29/21 1529)  iohexol (OMNIPAQUE) 350 MG/ML injection 100 mL (100 mLs Intravenous Contrast Given 05/29/21 1249)    ED Course  I have reviewed the triage vital signs and the nursing notes.  Pertinent labs & imaging results that were available during my care of the patient were reviewed by me and considered in my medical decision making (see chart for details).    MDM Rules/Calculators/A&P                           Karla Hoover is a 50 y.o. female with a past medical history significant for bilateral pulmonary emboli on Eliquis therapy, diabetes, hypertension, GERD, hyperlipidemia, hypothyroidism, COPD, asthma, and previous stroke with persistent left hemibody weakness who presents with 9 days of pain in bilateral arms going to neck as well as back and upper chest with associated numbness and weakness.  According to patient, for the  last 9 days she has had waxing and waning but overall worsening pain in her bilateral arms going towards her neck and upper back.  She reports it also goes towards her chest where she has some tenderness.  She does not think it feels exactly like her previous pulmonary emboli as they were 10 out of 10 in pain and this is only 7 out of 10 in pain.  She reports it is a tingling and numb type pain in her bilateral arms.  She reports her previous drink was solely weakness and she denies any numbness.  She reports it is symmetric in both arms going up towards her neck where she is having pain as well.  She denies trauma.  She denies any fevers, chills, congestion, cough, palpitations, nausea, vomiting, constipation, diarrhea, or urinary changes.  Denies any leg symptoms aside from her chronic left leg weakness.  She denies drug use to me.  On exam, patient is weakness in the left face, left arm, left leg.  She has a positive Hoffmann's in left hand but not in the right.  Subjectively she thinks that her right hand feels weaker than baseline for her but it is much stronger than the left side.  She reports numbness in both arms compared to baseline.  Normal sensation in the face.  Normal sensation in the legs.  Lungs clear.  Chest is tender to palpation.  No murmur.  Back nontender.  Clinically I am concerned about several etiology of the patient symptoms.  Due to the bilateral arm numbness and the subjective new weakness in the stronger side with pain going to her neck symmetrically, I do feel we need to get imaging to  rule out problems.  Spoke with neurology who recommended multiple images.  He recommended CTA head and neck as well as MRI brain and C-spine.  We will also get PE study to rule out pulmonary embolism as a cause of her chest discomfort.  Anticipate reassessment after work-up.  3:02 PM Work-up has returned with some surprising findings.  Patient was found of elevated troponin at 176 then 151  respectively.  Chest pain has improved.  MRI of the head does not show evidence of new stroke.  CT scan did show abnormalities including carotid occlusion as well as some stenosis and atherosclerosis.  I spoke to neurology who did not think this was acute and he even spoke to neuro interventional radiology who agreed this did not appear acute.  Thus, low suspicion for stroke or acute dissection.  She is already anticoagulated.  Despite her chest pain improving, I am still concerned about the elevated troponin and chest discomfort.  She reports that during her previous admission they did not know why her troponin was elevated.  The CT scan did not show acute PE today.  Had a discussion with patient and given the elevated troponin in the setting of discomfort, we will have her admitted to the hospital overnight for further cardiac monitoring and trending the troponin.  For the tingling and numbness sensation, she will likely need outpatient neurology follow-up.  Will call for medicine admission for chest pain today with elevated troponin.    Final Clinical Impression(s) / ED Diagnoses Final diagnoses:  Chest pain, unspecified type  Elevated troponin     Clinical Impression: 1. Chest pain, unspecified type   2. Elevated troponin     Disposition: Admit  This note was prepared with assistance of Dragon voice recognition software. Occasional wrong-word or sound-a-like substitutions may have occurred due to the inherent limitations of voice recognition software.     Adien Kimmel, Canary Brim, MD 05/29/21 1550

## 2021-05-29 NOTE — ED Notes (Signed)
Hilarie Fredrickson, RN has been sent a secure message in regards to the patient having a bed assignment.

## 2021-05-29 NOTE — ED Notes (Signed)
Trop 176, MD Tegeler aware.

## 2021-05-29 NOTE — ED Notes (Signed)
PM routine medications given as ordered.  The patient appears agitated and wants her hospital gown removed and her street clothing put back on.  She states that she has no idea why she is being admitted and reports that she was asleep when the MD talked with her.  I read the MD progress notes to her and attempted to explain her reason for hospitalization.  She seems satisfied with the explanation

## 2021-05-30 ENCOUNTER — Ambulatory Visit (HOSPITAL_COMMUNITY)
Admit: 2021-05-30 | Discharge: 2021-05-30 | Disposition: A | Discharge status: Home / Self Care | Payer: Medicaid Other | Attending: Cardiology | Admitting: Cardiology

## 2021-05-30 ENCOUNTER — Observation Stay (HOSPITAL_COMMUNITY): Payer: Medicaid Other

## 2021-05-30 DIAGNOSIS — R079 Chest pain, unspecified: Secondary | ICD-10-CM | POA: Diagnosis not present

## 2021-05-30 DIAGNOSIS — R778 Other specified abnormalities of plasma proteins: Secondary | ICD-10-CM

## 2021-05-30 DIAGNOSIS — I2699 Other pulmonary embolism without acute cor pulmonale: Secondary | ICD-10-CM | POA: Diagnosis not present

## 2021-05-30 LAB — COMPREHENSIVE METABOLIC PANEL
ALT: 21 U/L (ref 0–44)
AST: 28 U/L (ref 15–41)
Albumin: 3.4 g/dL — ABNORMAL LOW (ref 3.5–5.0)
Alkaline Phosphatase: 91 U/L (ref 38–126)
Anion gap: 8 (ref 5–15)
BUN: 20 mg/dL (ref 6–20)
CO2: 26 mmol/L (ref 22–32)
Calcium: 9.9 mg/dL (ref 8.9–10.3)
Chloride: 101 mmol/L (ref 98–111)
Creatinine, Ser: 1.13 mg/dL — ABNORMAL HIGH (ref 0.44–1.00)
GFR, Estimated: 60 mL/min — ABNORMAL LOW (ref >60)
Glucose, Bld: 190 mg/dL — ABNORMAL HIGH (ref 70–99)
Potassium: 4.6 mmol/L (ref 3.5–5.1)
Sodium: 135 mmol/L (ref 135–145)
Total Bilirubin: 0.4 mg/dL (ref 0.3–1.2)
Total Protein: 6.8 g/dL (ref 6.5–8.1)

## 2021-05-30 LAB — GLUCOSE, CAPILLARY
Glucose-Capillary: 188 mg/dL — ABNORMAL HIGH (ref 70–99)
Glucose-Capillary: 178 mg/dL — ABNORMAL HIGH (ref 70–99)
Glucose-Capillary: 219 mg/dL — ABNORMAL HIGH (ref 70–99)
Glucose-Capillary: 346 mg/dL — ABNORMAL HIGH (ref 70–99)
Glucose-Capillary: 183 mg/dL — ABNORMAL HIGH (ref 70–99)

## 2021-05-30 LAB — LIPID PANEL
Cholesterol: 162 mg/dL (ref 0–200)
HDL: 28 mg/dL — ABNORMAL LOW (ref >40)
LDL Cholesterol: 74 mg/dL (ref 0–99)
Total CHOL/HDL Ratio: 5.8 RATIO
Triglycerides: 300 mg/dL — ABNORMAL HIGH (ref <150)
VLDL: 60 mg/dL — ABNORMAL HIGH (ref 0–40)

## 2021-05-30 LAB — APTT: aPTT: 33 seconds (ref 24–36)

## 2021-05-30 LAB — PROTIME-INR
INR: 1.3 — ABNORMAL HIGH (ref 0.8–1.2)
Prothrombin Time: 16.2 seconds — ABNORMAL HIGH (ref 11.4–15.2)

## 2021-05-30 MED ORDER — TECHNETIUM TC 99M TETROFOSMIN IV KIT
31.3000 | PACK | Freq: Once | INTRAVENOUS | Status: AC | PRN
  Administered 2021-05-30: 31.3 via INTRAVENOUS

## 2021-05-30 MED ORDER — TECHNETIUM TC 99M TETROFOSMIN IV KIT
10.7000 | PACK | Freq: Once | INTRAVENOUS | Status: AC | PRN
  Administered 2021-05-30: 10.7 via INTRAVENOUS

## 2021-05-30 MED ORDER — REGADENOSON 0.4 MG/5ML IV SOLN
0.4000 mg | Freq: Once | INTRAVENOUS | Status: AC
  Administered 2021-05-30: 0.4 mg via INTRAVENOUS
  Filled 2021-05-30: qty 5

## 2021-05-30 MED ORDER — REGADENOSON 0.4 MG/5ML IV SOLN
INTRAVENOUS | Status: AC
  Filled 2021-05-30: qty 5

## 2021-05-30 NOTE — Progress Notes (Signed)
Patient has returned from procedure. No change from am assessment. Pt is stable at this time.

## 2021-05-30 NOTE — Progress Notes (Signed)
  Echocardiogram 2D Echocardiogram has been attempted. Patient eating lunch and requests to return at later time.  Karla Hoover 05/30/2021, 1:59 PM

## 2021-05-30 NOTE — Progress Notes (Signed)
PROGRESS NOTE    Karla Hoover  ZOX:[id number] DOB: [birth year] DOA: 05/29/2021 PCP: Patient, [name]    Brief Narrative:  50 y.o. female with medical history significant of asthma, COPD, diabetes, hypertension, history of PE, history of stroke with left-sided residual deficits facial droop presents with 2 days of upper extremity numbness and pain that radiated to her neck and chest.  Symptoms of arm pain and numbness are waxing and waning in nature.  Patient initially reported 9 days of gradual waxing and waning shoulder bilaterally arm and pain weakness.  No nausea vomiting or diaphoresis.  Patient is initial blood pressures 130s to 150s over 80s to 100s.  Patient's troponin 176 down trended to 151.  Sodium 132 potassium 5.2 glucose 297 creatinine 1.24 GFR 53.  CBC unremarkable except platelets 221.  Chest x-ray negative, CT of the chest showed suboptimal opacification of pulmonary arteries, no large central pulmonary embolus identified.  EKG showing sinus rhythm.  Hospitalist service called to admit for elevated troponins.  Patient given Ativan liter of IV fluid in the ED. patient also endorses some bilateral upper extremity pain shoulders also right hand and wrist pain is improved.  Patient states she came in today to be evaluated due to ongoing pain of intermittently chest pain, patient is unsure of any relieving factors or worsening factors.  Patient also smokes half pack per day.  Patient denies any nausea vomiting diaphoresis.  Patient is unsure of last stress test.  Patient states she is from a [facility].  Assessment & Plan:   Principal Problem:   Chest pain Active Problems:   Essential hypertension   Bilateral pulmonary embolism (HCC)   DM (diabetes mellitus), type 2 (HCC)   Tobacco abuse   Type 2 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 disease, with long-term current use of insulin (HCC)   GERD without esophagitis   Hypothyroidism  Chest pain rule  out ACS, Continue home aspirin and Eliquis Cardiology consulted, patient now status post stress test, pending results Troponin have since trended downward Follow-up per cardiology recommendations   Diabetes type 2-follow-up blood sugar checks Continue sliding scale as needed  Hyperlipidemia-continue statin follow-up lipid panel  Hypertension-continue home Cozaar, metoprolol  Hypothyroidism-continue Synthroid  as tolerated  GERD-continue PPI   Tobacco abuse-encourage cessation   History of PE-continue Eliquis    DVT prophylaxis: Eliquis Code Status: Full Family Communication: Pt in room, family not at bedside  Status is: Observation  The patient remains OBS appropriate and will d/c before 2 midnights.  Dispo: The patient is from: SNF              Anticipated d/c is to: SNF              Patient currently is not medically stable to d/c.   Difficult to place patient No       Consultants:  Cardiology  Procedures:  Stress test  Antimicrobials: Anti-infectives (From admission, onward)    None       Subjective: Still complaining of wrist and arm pain bilaterally radiating to shoulders  Objective: Vitals:   05/30/21 1054 05/30/21 1056 05/30/21 1058 05/30/21 1229  BP: (!) 148/98 (!) 124/93 (!) 129/94 115/87  Pulse: (!) 110 (!) 110 99 97  Resp:      Temp:    98.7 F (37.1 C)  TempSrc:    Oral  SpO2:    95%  Weight:      Height:  No intake or output data in the 24 hours ending 05/30/21 Rickey Primus Filed Weights   05/29/21 0033  Weight: 89.4 kg    Examination: General exam: Awake, laying in bed, in nad Respiratory system: Normal respiratory effort, no wheezing Cardiovascular system: regular rate, s1, s2 Gastrointestinal system: Soft, nondistended, positive BS Central nervous system: CN2-12 grossly intact, strength intact Extremities: Perfused, no clubbing Skin: Normal skin turgor, no notable skin lesions seen Psychiatry: Mood normal // no visual  hallucinations   Data Reviewed: I have personally reviewed following labs and imaging studies  CBC: Recent Labs  Lab 05/29/21 0845  WBC 8.3  NEUTROABS 4.2  HGB 13.9  HCT 42.4  MCV 83.3  PLT 221   Basic Metabolic Panel: Recent Labs  Lab 05/29/21 0845 05/30/21 0418  NA 132* 135  K 5.2* 4.6  CL 99 101  CO2 25 26  GLUCOSE 297* 190*  BUN 24* 20  CREATININE 1.24* 1.13*  CALCIUM 9.6 9.9   GFR: Estimated Creatinine Clearance: 69.8 mL/min (A) (by C-G formula based on SCr of 1.13 mg/dL (H)). Liver Function Tests: Recent Labs  Lab 05/29/21 0845 05/30/21 0418  AST 28 28  ALT 22 21  ALKPHOS 101 91  BILITOT 0.5 0.4  PROT 7.9 6.8  ALBUMIN 4.0 3.4*   No results for input(s): LIPASE, AMYLASE in the last 168 hours. No results for input(s): AMMONIA in the last 168 hours. Coagulation Profile: Recent Labs  Lab 05/30/21 0418  INR 1.3*   Cardiac Enzymes: No results for input(s): CKTOTAL, CKMB, CKMBINDEX, TROPONINI in the last 168 hours. BNP (last 3 results) No results for input(s): PROBNP in the last 8760 hours. HbA1C: Recent Labs    05/29/21 1618  HGBA1C 9.9*   CBG: Recent Labs  Lab 05/29/21 1646 05/29/21 2234 05/30/21 0746 05/30/21 1223 05/30/21 1601  GLUCAP 141* 188* 178* 219* 346*   Lipid Profile: Recent Labs    05/30/21 0418  CHOL 162  HDL 28*  LDLCALC 74  TRIG 235*  CHOLHDL 5.8   Thyroid Function Tests: No results for input(s): TSH, T4TOTAL, FREET4, T3FREE, THYROIDAB in the last 72 hours. Anemia Panel: No results for input(s): VITAMINB12, FOLATE, FERRITIN, TIBC, IRON, RETICCTPCT in the last 72 hours. Sepsis Labs: No results for input(s): PROCALCITON, LATICACIDVEN in the last 168 hours.  Recent Results (from the past 240 hour(s))  Resp Panel by RT-PCR (Flu A&B, Covid) Nasopharyngeal Swab     Status: None   Collection Time: 05/29/21  3:06 PM   Specimen: Nasopharyngeal Swab; Nasopharyngeal(NP) swabs in vial transport medium  Result Value Ref  Range Status   SARS Coronavirus 2 by RT PCR NEGATIVE NEGATIVE Final    Comment: (NOTE) SARS-CoV-2 target nucleic acids are NOT DETECTED.  The SARS-CoV-2 RNA is generally detectable in upper respiratory specimens during the acute phase of infection. The lowest concentration of SARS-CoV-2 viral copies this assay can detect is 138 copies/mL. A negative result does not preclude SARS-Cov-2 infection and should not be used as the sole basis for treatment or other patient management decisions. A negative result may occur with  improper specimen collection/handling, submission of specimen other than nasopharyngeal swab, presence of viral mutation(s) within the areas targeted by this assay, and inadequate number of viral copies(<138 copies/mL). A negative result must be combined with clinical observations, patient history, and epidemiological information. The expected result is Negative.  Fact Sheet for Patients:  BloggerCourse.com  Fact Sheet for Healthcare Providers:  SeriousBroker.it  This test is no t yet approved  or cleared by the [place] and  has been authorized for detection and/or diagnosis of SARS-CoV-2 by FDA under an Emergency Use Authorization (EUA). This EUA will remain  in effect (meaning this test can be used) for the duration of the COVID-19 declaration under Section 564(b)(1) of the Act, 21 U.S.C.section 360bbb-3(b)(1), unless the authorization is terminated  or revoked sooner.       Influenza A by PCR NEGATIVE NEGATIVE Final   Influenza B by PCR NEGATIVE NEGATIVE Final    Comment: (NOTE) The Xpert Xpress SARS-CoV-2/FLU/RSV plus assay is intended as an aid in the diagnosis of influenza from Nasopharyngeal swab specimens and should not be used as a sole basis for treatment. Nasal washings and aspirates are unacceptable for Xpert Xpress SARS-CoV-2/FLU/RSV testing.  Fact Sheet for  Patients: BloggerCourse.com  Fact Sheet for Healthcare Providers: SeriousBroker.it  This test is not yet approved or cleared by the [place] FDA and has been authorized for detection and/or diagnosis of SARS-CoV-2 by FDA under an Emergency Use Authorization (EUA). This EUA will remain in effect (meaning this test can be used) for the duration of the COVID-19 declaration under Section 564(b)(1) of the Act, 21 U.S.C. section 360bbb-3(b)(1), unless the authorization is terminated or revoked.  Performed at [facility], [address], [place], [place] [address]      Radiology Studies: CT Angio Head W or Wo Contrast  Result Date: 05/29/2021 CLINICAL DATA:  Pain, numbness, and weakness in bilateral arms EXAM: CT ANGIOGRAPHY HEAD AND NECK TECHNIQUE: Multidetector CT imaging of the head and neck was performed using the standard protocol during bolus administration of intravenous contrast. Multiplanar CT image reconstructions and MIPs were obtained to evaluate the vascular anatomy. Carotid stenosis measurements (when applicable) are obtained utilizing NASCET criteria, using the distal internal carotid diameter as the denominator. CONTRAST:  OMNIPAQUE IOHEXOL 350 MG/ML SOLN COMPARISON:  Same-day brain and cervical spine MRIs, CT head 11/03/2019 FINDINGS: CT HEAD FINDINGS Brain: There is no evidence of acute intracranial hemorrhage, extra-axial fluid collection, or acute infarct. There is a remote infarct in the right basal ganglia/external capsule. Additional smaller remote infarcts are seen in the right precentral gyrus and parietal lobe cortex. There is unchanged ex vacuo dilatation of the right lateral ventricle. The ventricles are otherwise normal in size and configuration. No mass lesion is identified. There is no midline shift. Vascular: See below Skull: Normal. Negative for fracture or focal lesion. Sinuses: Imaged  portions are clear. Orbits: No acute finding. Review of the MIP images confirms the above findings CTA NECK FINDINGS Aortic arch: Standard branching. Imaged portion shows no evidence of aneurysm or dissection. No significant stenosis of the major arch vessel origins. Right carotid system: The right common carotid artery is patent. The internal carotid artery is occluded from shortly after the origin through the supraclinoid segment. Left carotid system: There is soft and calcified atherosclerotic plaque of the left carotid bulb resulting in less than 50% stenosis. There is no evidence of dissection or aneurysm. Vertebral arteries: There is soft plaque throughout the proximal left subclavian artery with involvement of the ostium of the left renal artery with moderate to severe stenosis at the origin. There is marked multifocal stenosis throughout the remainder of the cervical portion of the left vertebral artery. The right vertebral artery is patent throughout. Skeleton: There is degenerative change of the temporomandibular joints, right more than left there is no acute osseous abnormality. Other neck: The soft tissues are unremarkable. There  is dental disease involving multiple teeth. Upper chest: The lung apices are clear. Review of the MIP images confirms the above findings CTA HEAD FINDINGS Anterior circulation: The right intracranial internal carotid artery is occluded with reconstitution of flow at the communicating segment likely via collateral flow from the anterior communicating artery. There is a linear filling defect in the cavernous segment of the left ICA (11-201, 12-86). There is no hemodynamically significant stenosis of the true lumen. The bilateral MCAs and ACAs are patent, though there is relatively diminutive enhancement of the right M1 segment compared to the left. No aneurysm is identified. Posterior circulation: The proximal left V4 segment is moderately stenotic with reconstitution of more  normal caliber flow distally, possibly via retrograde flow from the right vertebral artery. The right V4 segment is patent. The basilar artery is patent. The proximal PCAs are patent. There is multifocal stenosis/occlusion of the left PCA beyond the P1 segment. The right PCA is patent. Venous sinuses: As permitted by contrast timing, patent. Anatomic variants: None. Review of the MIP images confirms the above findings IMPRESSION: 1. 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 2. Multifocal irregularity with diffuse moderate to severe stenosis of the left vertebral artery most suspicious for chronic dissection. The distal left V4 segment is patent, possibly via collateral flow from the right vertebral artery. 3. Linear filling defect in the left cavernous ICA she suspicious for dissection without hemodynamically significant stenosis or occlusion. Correlation with angiogram is recommended if indicated. 4. Irregularity with multifocal severe stenosis/occlusion of the distal left PCA branches. Electronically Signed   By: Lesia Hausen M.D.   On: 05/29/2021 13:59   DG Chest 2 View  Result Date: 05/29/2021 CLINICAL DATA:  Chest pain. EXAM: CHEST - 2 VIEW COMPARISON:  April 01, 2021. FINDINGS: The heart size and mediastinal contours are within normal limits. Both lungs are clear. The visualized skeletal structures are unremarkable. IMPRESSION: No active cardiopulmonary disease. Electronically Signed   By: Lupita Raider M.D.   On: 05/29/2021 09:26   CT Angio Neck W and/or Wo Contrast  Result Date: 05/29/2021 CLINICAL DATA:  Pain, numbness, and weakness in bilateral arms EXAM: CT ANGIOGRAPHY HEAD AND NECK TECHNIQUE: Multidetector CT imaging of the head and neck was performed using the standard protocol during bolus administration of intravenous contrast.  Multiplanar CT image reconstructions and MIPs were obtained to evaluate the vascular anatomy. Carotid stenosis measurements (when applicable) are obtained utilizing NASCET criteria, using the distal internal carotid diameter as the denominator. CONTRAST:  OMNIPAQUE IOHEXOL 350 MG/ML SOLN COMPARISON:  Same-day brain and cervical spine MRIs, CT head 11/03/2019 FINDINGS: CT HEAD FINDINGS Brain: There is no evidence of acute intracranial hemorrhage, extra-axial fluid collection, or acute infarct. There is a remote infarct in the right basal ganglia/external capsule. Additional smaller remote infarcts are seen in the right precentral gyrus and parietal lobe cortex. There is unchanged ex vacuo dilatation of the right lateral ventricle. The ventricles are otherwise normal in size and configuration. No mass lesion is identified. There is no midline shift. Vascular: See below Skull: Normal. Negative for fracture or focal lesion. Sinuses: Imaged portions are clear. Orbits: No acute finding. Review of the MIP images confirms the above findings CTA NECK FINDINGS Aortic arch: Standard branching. Imaged portion shows no evidence of aneurysm or dissection. No significant stenosis of the major  arch vessel origins. Right carotid system: The right common carotid artery is patent. The internal carotid artery is occluded from shortly after the origin through the supraclinoid segment. Left carotid system: There is soft and calcified atherosclerotic plaque of the left carotid bulb resulting in less than 50% stenosis. There is no evidence of dissection or aneurysm. Vertebral arteries: There is soft plaque throughout the proximal left subclavian artery with involvement of the ostium of the left renal artery with moderate to severe stenosis at the origin. There is marked multifocal stenosis throughout the remainder of the cervical portion of the left vertebral artery. The right vertebral artery is patent throughout. Skeleton: There is  degenerative change of the temporomandibular joints, right more than left there is no acute osseous abnormality. Other neck: The soft tissues are unremarkable. There is dental disease involving multiple teeth. Upper chest: The lung apices are clear. Review of the MIP images confirms the above findings CTA HEAD FINDINGS Anterior circulation: The right intracranial internal carotid artery is occluded with reconstitution of flow at the communicating segment likely via collateral flow from the anterior communicating artery. There is a linear filling defect in the cavernous segment of the left ICA (11-201, 12-86). There is no hemodynamically significant stenosis of the true lumen. The bilateral MCAs and ACAs are patent, though there is relatively diminutive enhancement of the right M1 segment compared to the left. No aneurysm is identified. Posterior circulation: The proximal left V4 segment is moderately stenotic with reconstitution of more normal caliber flow distally, possibly via retrograde flow from the right vertebral artery. The right V4 segment is patent. The basilar artery is patent. The proximal PCAs are patent. There is multifocal stenosis/occlusion of the left PCA beyond the P1 segment. The right PCA is patent. Venous sinuses: As permitted by contrast timing, patent. Anatomic variants: None. Review of the MIP images confirms the above findings IMPRESSION: 1. 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 2. Multifocal irregularity with diffuse moderate to severe stenosis of the left vertebral artery most suspicious for chronic dissection. The distal left V4 segment is patent, possibly via collateral flow from the right vertebral artery. 3. Linear filling defect in the left cavernous ICA she suspicious for dissection without hemodynamically significant  stenosis or occlusion. Correlation with angiogram is recommended if indicated. 4. Irregularity with multifocal severe stenosis/occlusion of the distal left PCA branches. Electronically Signed   By: Lesia Hausen M.D.   On: 05/29/2021 13:59   CT Angio Chest PE W and/or Wo Contrast  Result Date: 05/29/2021 CLINICAL DATA:  Right hand pain radiating up arm into chest. EXAM: CT ANGIOGRAPHY CHEST WITH CONTRAST TECHNIQUE: Multidetector CT imaging of the chest was performed using the standard protocol during bolus administration of intravenous contrast. Multiplanar CT image reconstructions and MIPs were obtained to evaluate the vascular anatomy. CONTRAST:  OMNIPAQUE IOHEXOL 350 MG/ML SOLN COMPARISON:  11/15/2019 FINDINGS: Cardiovascular: Suboptimal opacification of pulmonary arteries. No large central pulmonary embolus identified. Heart size is within normal limits. Mediastinum/Nodes: No enlarged mediastinal, hilar, or axillary lymph nodes. Thyroid gland, trachea, and esophagus demonstrate no significant findings. Lungs/Pleura: Lungs are clear. No pleural effusion or pneumothorax. Upper Abdomen: 2 cm simple cyst noted at the upper pole of the right kidney. Visualized upper abdominal organs otherwise unremarkable. Musculoskeletal: No chest wall abnormality. No acute or significant osseous findings. Review of the  MIP images confirms the above findings. IMPRESSION: Suboptimal opacification of pulmonary arteries. No large central pulmonary embolus identified. Exam nondiagnostic for more distal branches. No acute abnormality of the chest. Electronically Signed   By: Acquanetta Belling M.D.   On: 05/29/2021 13:55   MR BRAIN WO CONTRAST  Result Date: 05/29/2021 CLINICAL DATA:  Bilateral arm numbness and weakness EXAM: MRI HEAD WITHOUT CONTRAST TECHNIQUE: Multiplanar, multiecho pulse sequences of the brain and surrounding structures were obtained without intravenous contrast. COMPARISON:  CT head 11/03/2019 FINDINGS: Brain:  There is a remote infarct in the right basal ganglia and external capsule with associated ex vacuo dilatation of the right lateral ventricle, unchanged. Linear DWI signal within the infarct territory in the basal ganglia without convincing low ADC signal is consistent with T2 shine through. T2* signal dropout within the infarct territory is consistent with hemosiderin deposition. Additional smaller infarcts are seen in the right precentral gyrus and parietal lobe cortex and left cerebellar hemisphere. Atrophy of the right cerebral peduncle likely reflects wallerian degeneration. There is no evidence of acute intracranial hemorrhage or infarct. No mass lesion is identified. There is no midline shift. Vascular: The right intracranial ICA flow void is attenuated. The right MCA and ACA flow voids are present. The left ICA flow void is present. Skull and upper cervical spine: Normal marrow signal. Sinuses/Orbits: The paranasal sinuses and mastoid air cells are clear. The globes and orbits are unremarkable. Other: None. IMPRESSION: 1. No acute intracranial hemorrhage or infarct. 2. Remote infarct in the right basal ganglia and external capsule, unchanged. Additional smaller remote infarcts in the right cerebral hemisphere and left cerebellar hemisphere as above. 3. Attenuation of the right intracranial ICA flow void; this could be further evaluated with CTA or MRA head/neck as indicated. Electronically Signed   By: Lesia Hausen M.D.   On: 05/29/2021 10:35   MR Cervical Spine Wo Contrast  Result Date: 05/29/2021 CLINICAL DATA:  Numbness, tingling, paresthesia in both arms EXAM: MRI CERVICAL SPINE WITHOUT CONTRAST TECHNIQUE: Multiplanar, multisequence MR imaging of the cervical spine was performed. No intravenous contrast was administered. COMPARISON:  CT cervical spine 11/03/2019 FINDINGS: Alignment: Physiologic. Vertebrae: Vertebral body heights are preserved. Marrow signal is normal. Cord: Normal signal and  morphology. Posterior Fossa, vertebral arteries, paraspinal tissues: The intracranial compartment is assessed on the separately dictated brain MRI. The vertebral artery flow voids are present. The paraspinal soft tissues are unremarkable. Disc levels: There is disc desiccation without significant loss of height throughout the cervical spine. At C3-C4, there is a posterior disc osteophyte complex, ligamentum flavum thickening, and mild uncovertebral and facet arthropathy resulting in mild-to-moderate spinal canal stenosis with effacement of the thecal sac without evidence of cord compression. There is mild right and no significant left neural foraminal stenosis. There is no significant spinal canal or neural foraminal stenosis at the remaining levels. IMPRESSION: Mild degenerative changes at C3-C4 as above result in mild-to-moderate spinal canal stenosis without evidence of cord compression or cord signal abnormality and mild right and no significant left neural foraminal stenosis. Otherwise, unremarkable MRI of the cervical spine. Electronically Signed   By: Lesia Hausen M.D.   On: 05/29/2021 10:41    Scheduled Meds:  apixaban  5 mg Oral Q12H   aspirin  81 mg Oral Daily   atorvastatin  80 mg Oral Daily   chlorhexidine  15 mL Mouth/Throat Daily   folic acid  1 mg Oral Daily   gabapentin  300 mg Oral TID   insulin  aspart  0-15 Units Subcutaneous TID WC   insulin glargine-yfgn  10 Units Subcutaneous QHS   levothyroxine  75 mcg Oral QAC breakfast   losartan  75 mg Oral Daily   metoprolol tartrate  25 mg Oral BID   mometasone-formoterol  2 puff Inhalation BID   omega-3 acid ethyl esters  1 g Oral Daily   pantoprazole  40 mg Oral Daily   senna-docusate  2 tablet Oral BID   vitamin B-12  500 mcg Oral Daily   Continuous Infusions:   LOS: 0 days   Rickey BarbaraStephen Mazzie Brodrick, MD [facility] Pager On Amion  If 7PM-7AM, please contact night-coverage 05/30/2021, 6:22 PM

## 2021-05-31 ENCOUNTER — Observation Stay (HOSPITAL_BASED_OUTPATIENT_CLINIC_OR_DEPARTMENT_OTHER): Payer: Medicaid Other

## 2021-05-31 DIAGNOSIS — R778 Other specified abnormalities of plasma proteins: Secondary | ICD-10-CM

## 2021-05-31 DIAGNOSIS — R079 Chest pain, unspecified: Secondary | ICD-10-CM | POA: Diagnosis not present

## 2021-05-31 DIAGNOSIS — I2699 Other pulmonary embolism without acute cor pulmonale: Secondary | ICD-10-CM | POA: Diagnosis not present

## 2021-05-31 LAB — ECHOCARDIOGRAM COMPLETE
AR max vel: 2.56 cm2
AV Area VTI: 2.51 cm2
AV Area mean vel: 2.58 cm2
AV Mean grad: 3 mmHg
AV Peak grad: 5.7 mmHg
Ao pk vel: 1.19 m/s
Area-P 1/2: 4.17 cm2
Calc EF: 50.7 %
Height: 67.5 in
S' Lateral: 1.9 cm
Single Plane A2C EF: 46.4 %
Single Plane A4C EF: 54.4 %
Weight: 3152 oz

## 2021-05-31 LAB — RESP PANEL BY RT-PCR (FLU A&B, COVID) ARPGX2
Influenza A by PCR: NEGATIVE
Influenza B by PCR: NEGATIVE
SARS Coronavirus 2 by RT PCR: NEGATIVE

## 2021-05-31 LAB — GLUCOSE, CAPILLARY
Glucose-Capillary: 223 mg/dL — ABNORMAL HIGH (ref 70–99)
Glucose-Capillary: 217 mg/dL — ABNORMAL HIGH (ref 70–99)
Glucose-Capillary: 261 mg/dL — ABNORMAL HIGH (ref 70–99)

## 2021-05-31 NOTE — TOC Progression Note (Signed)
Transition of Care [facility]) - Progression Note    Patient Details  Name: Karla Hoover MRN: [id number] Date of Birth: [birth year]  [facility] CM/SW Contact  Geni Bers, RN Phone Number: 05/31/2021, 10:59 AM  Clinical Narrative:    Pt from [facility], SNF may return today.    Expected Discharge Plan: Skilled Nursing Facility Barriers to Discharge: No Barriers Identified  Expected Discharge Plan and Services Expected Discharge Plan: Skilled Nursing Facility   Discharge Planning Services: CM Consult   Living arrangements for the past 2 months: Skilled Nursing Facility                                       Social Determinants of Health (SDOH) Interventions    Readmission Risk Interventions No flowsheet data found.

## 2021-05-31 NOTE — Discharge Summary (Signed)
Physician Discharge Summary  ANNIS LAGOY ZOX:[id number] DOB: Jul 05, 1971 DOA: 05/29/2021  PCP: Patient, [name]  Admit date: 05/29/2021 Discharge date: 05/31/2021  Admitted From: SNF Disposition:  SNF  Recommendations for Outpatient Follow-up:  Follow up with PCP in 1-2 weeks  Discharge Condition:Stable CODE STATUS:Full Diet recommendation: Diabetic Heart healthy   Brief/Interim Summary: 50 y.o. female with medical history significant of asthma, COPD, diabetes, hypertension, history of PE, history of stroke with left-sided residual deficits facial droop presents with 2 days of upper extremity numbness and pain that radiated to her neck and chest.  Symptoms of arm pain and numbness are waxing and waning in nature.  Patient initially reported 9 days of gradual waxing and waning shoulder bilaterally arm and pain weakness.  No nausea vomiting or diaphoresis.  Patient is initial blood pressures 130s to 150s over 80s to 100s.  Patient's troponin 176 down trended to 151.  Sodium 132 potassium 5.2 glucose 297 creatinine 1.24 GFR 53.  CBC unremarkable except platelets 221.  Chest x-ray negative, CT of the chest showed suboptimal opacification of pulmonary arteries, no large central pulmonary embolus identified.  EKG showing sinus rhythm.  Hospitalist service called to admit for elevated troponins.  Patient given Ativan liter of IV fluid in the ED. patient also endorses some bilateral upper extremity pain shoulders also right hand and wrist pain is improved.  Patient states she came in today to be evaluated due to ongoing pain of intermittently chest pain, patient is unsure of any relieving factors or worsening factors.  Patient also smokes half pack per day.  Patient denies any nausea vomiting diaphoresis.  Discharge Diagnoses:  Principal Problem:   Chest pain Active Problems:   Essential hypertension   Bilateral pulmonary embolism (HCC)   DM (diabetes mellitus), type 2 (HCC)    Tobacco abuse   Type 2 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 disease, with long-term current use of insulin (HCC)   GERD without esophagitis   Hypothyroidism  Chest and arm, wrist pain -On further questioning, patient described pain as beginning in B wrists, radiating up to the shoulder and across chest, symptoms worse at night and can be reproduced on tapping over ventral area of wrists -Continue home aspirin and Eliquis -Cardiology consulted, patient now status post stress test, noted to be negative -Troponin have since trended downward -consider B wrist braces   Diabetes type 2 -Continue insulin as tolerated  Hyperlipidemia -continue statin  Hypertension -continue home Cozaar, metoprolol as tolerated  Hypothyroidism -continue Synthroid  as tolerated  GERD -continue PPI   Tobacco abuse -encourage cessation   History of PE -continue Eliquis. No evidence of acute blood loss    Discharge Instructions   Allergies as of 05/31/2021       Reactions   Guaifenesin Anaphylaxis   Kiwi Extract Anaphylaxis, Rash   Levaquin [levofloxacin In D5w] Shortness Of Breath, Rash   Strawberry Extract Anaphylaxis, Rash   Baclofen Other (See Comments)   Near syncope/ fall        Medication List     STOP taking these medications    azithromycin 250 MG tablet Commonly known as: ZITHROMAX   cephALEXin 500 MG capsule Commonly known as: Keflex   dicyclomine 20 MG tablet Commonly known as: BENTYL   polyethylene glycol 17 g packet Commonly known as: MIRALAX / GLYCOLAX   promethazine 25 MG tablet Commonly known as: PHENERGAN   senna-docusate 8.6-50 MG tablet Commonly known as: Senokot-S  TAKE these medications    acetaminophen 500 MG tablet Commonly known as: TYLENOL Take 1,000 mg by mouth in the morning, at noon, and at bedtime.   albuterol 108 (90 Base) MCG/ACT inhaler Commonly known as: VENTOLIN HFA Inhale 2 puffs into the lungs every 6 (six) hours  as needed for wheezing or shortness of breath. What changed: reasons to take this   apixaban 5 MG Tabs tablet Commonly known as: Eliquis Take 2 tablets (10mg ) twice daily for 7 days, then 1 tablet (5mg ) twice daily What changed:  how much to take how to take this when to take this additional instructions   aspirin 81 MG chewable tablet Chew 81 mg by mouth in the morning.   atorvastatin 80 MG tablet Commonly known as: LIPITOR Take 1 tablet (80 mg total) by mouth daily. What changed: when to take this   chlorhexidine 0.12 % solution Commonly known as: PERIDEX Use as directed 15 mLs in the mouth or throat daily.   Cozaar 50 MG tablet Generic drug: losartan Take 75 mg by mouth daily.   cyclobenzaprine 10 MG tablet Commonly known as: FLEXERIL Take 1 tablet (10 mg total) by mouth 3 (three) times daily as needed for muscle spasms. What changed:  when to take this additional instructions   Fish Oil 1000 MG Caps Take 1,000 mg by mouth in the morning and at bedtime.   folic acid 800 MCG tablet Commonly known as: FOLVITE Take 800 mcg by mouth daily.   gabapentin 300 MG capsule Commonly known as: NEURONTIN Take 1 capsule (300 mg total) by mouth 3 (three) times daily. What changed:  how much to take when to take this   HYDROcodone-acetaminophen 5-325 MG tablet Commonly known as: NORCO/VICODIN Take 2 tablets by mouth every 6 (six) hours as needed.   hydrOXYzine 10 MG tablet Commonly known as: ATARAX/VISTARIL Take 10 mg by mouth 2 (two) times daily as needed for anxiety.   insulin glargine 100 UNIT/ML injection Commonly known as: LANTUS Inject 35 Units into the skin daily. What changed: Another medication with the same name was removed. Continue taking this medication, and follow the directions you see here.   insulin lispro 100 UNIT/ML injection Commonly known as: HUMALOG Inject 2-14 Units into the skin 3 (three) times daily with meals. Per sliding scale: CBG 201-250  = 2 Units, 251-300 = 4 units,301-350 = 6 units, 351-400 = 8 units, 401-450 = 10 units, 451-500 = 12 units, 501 = 14 units, recheck in 2 hours.   levothyroxine 75 MCG tablet Commonly known as: SYNTHROID Take 75 mcg by mouth daily before breakfast.   lidocaine 5 % Commonly known as: Lidoderm Place 1 patch onto the skin daily. Remove & Discard patch within 12 hours or as directed by MD   linagliptin 5 MG Tabs tablet Commonly known as: TRADJENTA Take 5 mg by mouth daily.   metoprolol tartrate 25 MG tablet Commonly known as: LOPRESSOR Take 1 tablet (25 mg total) by mouth 2 (two) times daily.   naproxen 250 MG tablet Commonly known as: NAPROSYN Take 250 mg by mouth at bedtime.   omeprazole 20 MG capsule Commonly known as: PRILOSEC Take 20 mg by mouth daily.   Symbicort 80-4.5 MCG/ACT inhaler Generic drug: budesonide-formoterol Inhale 2 puffs into the lungs 2 (two) times daily.   vitamin B-12 500 MCG tablet Commonly known as: CYANOCOBALAMIN Take 500 mcg by mouth daily.        Follow-up Information     Follow up  with PCP in 1-2 weeks Follow up.   Why: Hospital follow up               Allergies  Allergen Reactions   Guaifenesin Anaphylaxis   Kiwi Extract Anaphylaxis and Rash   Levaquin [Levofloxacin In D5w] Shortness Of Breath and Rash   Strawberry Extract Anaphylaxis and Rash   Baclofen Other (See Comments)    Near syncope/ fall    Consultations: Cardiology  Procedures/Studies: CT Angio Head W or Wo Contrast  Result Date: 05/29/2021 CLINICAL DATA:  Pain, numbness, and weakness in bilateral arms EXAM: CT ANGIOGRAPHY HEAD AND NECK TECHNIQUE: Multidetector CT imaging of the head and neck was performed using the standard protocol during bolus administration of intravenous contrast. Multiplanar CT image reconstructions and MIPs were obtained to evaluate the vascular anatomy. Carotid stenosis measurements (when applicable) are obtained utilizing NASCET criteria,  using the distal internal carotid diameter as the denominator. CONTRAST:  OMNIPAQUE IOHEXOL 350 MG/ML SOLN COMPARISON:  Same-day brain and cervical spine MRIs, CT head 11/03/2019 FINDINGS: CT HEAD FINDINGS Brain: There is no evidence of acute intracranial hemorrhage, extra-axial fluid collection, or acute infarct. There is a remote infarct in the right basal ganglia/external capsule. Additional smaller remote infarcts are seen in the right precentral gyrus and parietal lobe cortex. There is unchanged ex vacuo dilatation of the right lateral ventricle. The ventricles are otherwise normal in size and configuration. No mass lesion is identified. There is no midline shift. Vascular: See below Skull: Normal. Negative for fracture or focal lesion. Sinuses: Imaged portions are clear. Orbits: No acute finding. Review of the MIP images confirms the above findings CTA NECK FINDINGS Aortic arch: Standard branching. Imaged portion shows no evidence of aneurysm or dissection. No significant stenosis of the major arch vessel origins. Right carotid system: The right common carotid artery is patent. The internal carotid artery is occluded from shortly after the origin through the supraclinoid segment. Left carotid system: There is soft and calcified atherosclerotic plaque of the left carotid bulb resulting in less than 50% stenosis. There is no evidence of dissection or aneurysm. Vertebral arteries: There is soft plaque throughout the proximal left subclavian artery with involvement of the ostium of the left renal artery with moderate to severe stenosis at the origin. There is marked multifocal stenosis throughout the remainder of the cervical portion of the left vertebral artery. The right vertebral artery is patent throughout. Skeleton: There is degenerative change of the temporomandibular joints, right more than left there is no acute osseous abnormality. Other neck: The soft tissues are unremarkable. There is dental  disease involving multiple teeth. Upper chest: The lung apices are clear. Review of the MIP images confirms the above findings CTA HEAD FINDINGS Anterior circulation: The right intracranial internal carotid artery is occluded with reconstitution of flow at the communicating segment likely via collateral flow from the anterior communicating artery. There is a linear filling defect in the cavernous segment of the left ICA (11-201, 12-86). There is no hemodynamically significant stenosis of the true lumen. The bilateral MCAs and ACAs are patent, though there is relatively diminutive enhancement of the right M1 segment compared to the left. No aneurysm is identified. Posterior circulation: The proximal left V4 segment is moderately stenotic with reconstitution of more normal caliber flow distally, possibly via retrograde flow from the right vertebral artery. The right V4 segment is patent. The basilar artery is patent. The proximal PCAs are patent. There is multifocal stenosis/occlusion of the left PCA beyond  the P1 segment. The right PCA is patent. Venous sinuses: As permitted by contrast timing, patent. Anatomic variants: None. Review of the MIP images confirms the above findings IMPRESSION: 1. 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 2. Multifocal irregularity with diffuse moderate to severe stenosis of the left vertebral artery most suspicious for chronic dissection. The distal left V4 segment is patent, possibly via collateral flow from the right vertebral artery. 3. Linear filling defect in the left cavernous ICA she suspicious for dissection without hemodynamically significant stenosis or occlusion. Correlation with angiogram is recommended if indicated. 4. Irregularity with multifocal severe stenosis/occlusion of the distal left PCA branches.  Electronically Signed   By: Lesia Hausen M.D.   On: 05/29/2021 13:59   DG Chest 2 View  Result Date: 05/29/2021 CLINICAL DATA:  Chest pain. EXAM: CHEST - 2 VIEW COMPARISON:  April 01, 2021. FINDINGS: The heart size and mediastinal contours are within normal limits. Both lungs are clear. The visualized skeletal structures are unremarkable. IMPRESSION: No active cardiopulmonary disease. Electronically Signed   By: Lupita Raider M.D.   On: 05/29/2021 09:26   CT Angio Neck W and/or Wo Contrast  Result Date: 05/29/2021 CLINICAL DATA:  Pain, numbness, and weakness in bilateral arms EXAM: CT ANGIOGRAPHY HEAD AND NECK TECHNIQUE: Multidetector CT imaging of the head and neck was performed using the standard protocol during bolus administration of intravenous contrast. Multiplanar CT image reconstructions and MIPs were obtained to evaluate the vascular anatomy. Carotid stenosis measurements (when applicable) are obtained utilizing NASCET criteria, using the distal internal carotid diameter as the denominator. CONTRAST:  OMNIPAQUE IOHEXOL 350 MG/ML SOLN COMPARISON:  Same-day brain and cervical spine MRIs, CT head 11/03/2019 FINDINGS: CT HEAD FINDINGS Brain: There is no evidence of acute intracranial hemorrhage, extra-axial fluid collection, or acute infarct. There is a remote infarct in the right basal ganglia/external capsule. Additional smaller remote infarcts are seen in the right precentral gyrus and parietal lobe cortex. There is unchanged ex vacuo dilatation of the right lateral ventricle. The ventricles are otherwise normal in size and configuration. No mass lesion is identified. There is no midline shift. Vascular: See below Skull: Normal. Negative for fracture or focal lesion. Sinuses: Imaged portions are clear. Orbits: No acute finding. Review of the MIP images confirms the above findings CTA NECK FINDINGS Aortic arch: Standard branching. Imaged portion shows no evidence of aneurysm or dissection. No  significant stenosis of the major arch vessel origins. Right carotid system: The right common carotid artery is patent. The internal carotid artery is occluded from shortly after the origin through the supraclinoid segment. Left carotid system: There is soft and calcified atherosclerotic plaque of the left carotid bulb resulting in less than 50% stenosis. There is no evidence of dissection or aneurysm. Vertebral arteries: There is soft plaque throughout the proximal left subclavian artery with involvement of the ostium of the left renal artery with moderate to severe stenosis at the origin. There is marked multifocal stenosis throughout the remainder of the cervical portion of the left vertebral artery. The right vertebral artery is patent throughout. Skeleton: There is degenerative change of the temporomandibular joints, right more than left there is no acute osseous abnormality. Other neck: The soft tissues are unremarkable. There is dental disease involving multiple teeth. Upper chest: The lung apices are clear. Review of the MIP images  confirms the above findings CTA HEAD FINDINGS Anterior circulation: The right intracranial internal carotid artery is occluded with reconstitution of flow at the communicating segment likely via collateral flow from the anterior communicating artery. There is a linear filling defect in the cavernous segment of the left ICA (11-201, 12-86). There is no hemodynamically significant stenosis of the true lumen. The bilateral MCAs and ACAs are patent, though there is relatively diminutive enhancement of the right M1 segment compared to the left. No aneurysm is identified. Posterior circulation: The proximal left V4 segment is moderately stenotic with reconstitution of more normal caliber flow distally, possibly via retrograde flow from the right vertebral artery. The right V4 segment is patent. The basilar artery is patent. The proximal PCAs are patent. There is multifocal  stenosis/occlusion of the left PCA beyond the P1 segment. The right PCA is patent. Venous sinuses: As permitted by contrast timing, patent. Anatomic variants: None. Review of the MIP images confirms the above findings IMPRESSION: 1. 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 2. Multifocal irregularity with diffuse moderate to severe stenosis of the left vertebral artery most suspicious for chronic dissection. The distal left V4 segment is patent, possibly via collateral flow from the right vertebral artery. 3. Linear filling defect in the left cavernous ICA she suspicious for dissection without hemodynamically significant stenosis or occlusion. Correlation with angiogram is recommended if indicated. 4. Irregularity with multifocal severe stenosis/occlusion of the distal left PCA branches. Electronically Signed   By: Lesia Hausen M.D.   On: 05/29/2021 13:59   CT Angio Chest PE W and/or Wo Contrast  Result Date: 05/29/2021 CLINICAL DATA:  Right hand pain radiating up arm into chest. EXAM: CT ANGIOGRAPHY CHEST WITH CONTRAST TECHNIQUE: Multidetector CT imaging of the chest was performed using the standard protocol during bolus administration of intravenous contrast. Multiplanar CT image reconstructions and MIPs were obtained to evaluate the vascular anatomy. CONTRAST:  OMNIPAQUE IOHEXOL 350 MG/ML SOLN COMPARISON:  11/15/2019 FINDINGS: Cardiovascular: Suboptimal opacification of pulmonary arteries. No large central pulmonary embolus identified. Heart size is within normal limits. Mediastinum/Nodes: No enlarged mediastinal, hilar, or axillary lymph nodes. Thyroid gland, trachea, and esophagus demonstrate no significant findings. Lungs/Pleura: Lungs are clear. No pleural effusion or pneumothorax. Upper Abdomen: 2 cm simple cyst noted at the upper pole  of the right kidney. Visualized upper abdominal organs otherwise unremarkable. Musculoskeletal: No chest wall abnormality. No acute or significant osseous findings. Review of the MIP images confirms the above findings. IMPRESSION: Suboptimal opacification of pulmonary arteries. No large central pulmonary embolus identified. Exam nondiagnostic for more distal branches. No acute abnormality of the chest. Electronically Signed   By: Acquanetta Belling M.D.   On: 05/29/2021 13:55   MR BRAIN WO CONTRAST  Result Date: 05/29/2021 CLINICAL DATA:  Bilateral arm numbness and weakness EXAM: MRI HEAD WITHOUT CONTRAST TECHNIQUE: Multiplanar, multiecho pulse sequences of the brain and surrounding structures were obtained without intravenous contrast. COMPARISON:  CT head 11/03/2019 FINDINGS: Brain: There is a remote infarct in the right basal ganglia and external capsule with associated ex vacuo dilatation of the right lateral ventricle, unchanged. Linear DWI signal within the infarct territory in the basal ganglia without convincing low ADC signal is consistent with T2 shine through. T2* signal dropout within the infarct territory is consistent with hemosiderin deposition. Additional smaller infarcts are seen in the right  precentral gyrus and parietal lobe cortex and left cerebellar hemisphere. Atrophy of the right cerebral peduncle likely reflects wallerian degeneration. There is no evidence of acute intracranial hemorrhage or infarct. No mass lesion is identified. There is no midline shift. Vascular: The right intracranial ICA flow void is attenuated. The right MCA and ACA flow voids are present. The left ICA flow void is present. Skull and upper cervical spine: Normal marrow signal. Sinuses/Orbits: The paranasal sinuses and mastoid air cells are clear. The globes and orbits are unremarkable. Other: None. IMPRESSION: 1. No acute intracranial hemorrhage or infarct. 2. Remote infarct in the right basal ganglia and external  capsule, unchanged. Additional smaller remote infarcts in the right cerebral hemisphere and left cerebellar hemisphere as above. 3. Attenuation of the right intracranial ICA flow void; this could be further evaluated with CTA or MRA head/neck as indicated. Electronically Signed   By: Lesia Hausen M.D.   On: 05/29/2021 10:35   MR Cervical Spine Wo Contrast  Result Date: 05/29/2021 CLINICAL DATA:  Numbness, tingling, paresthesia in both arms EXAM: MRI CERVICAL SPINE WITHOUT CONTRAST TECHNIQUE: Multiplanar, multisequence MR imaging of the cervical spine was performed. No intravenous contrast was administered. COMPARISON:  CT cervical spine 11/03/2019 FINDINGS: Alignment: Physiologic. Vertebrae: Vertebral body heights are preserved. Marrow signal is normal. Cord: Normal signal and morphology. Posterior Fossa, vertebral arteries, paraspinal tissues: The intracranial compartment is assessed on the separately dictated brain MRI. The vertebral artery flow voids are present. The paraspinal soft tissues are unremarkable. Disc levels: There is disc desiccation without significant loss of height throughout the cervical spine. At C3-C4, there is a posterior disc osteophyte complex, ligamentum flavum thickening, and mild uncovertebral and facet arthropathy resulting in mild-to-moderate spinal canal stenosis with effacement of the thecal sac without evidence of cord compression. There is mild right and no significant left neural foraminal stenosis. There is no significant spinal canal or neural foraminal stenosis at the remaining levels. IMPRESSION: Mild degenerative changes at C3-C4 as above result in mild-to-moderate spinal canal stenosis without evidence of cord compression or cord signal abnormality and mild right and no significant left neural foraminal stenosis. Otherwise, unremarkable MRI of the cervical spine. Electronically Signed   By: Lesia Hausen M.D.   On: 05/29/2021 10:41   ECHOCARDIOGRAM COMPLETE  Result  Date: 05/31/2021    ECHOCARDIOGRAM REPORT   Patient Name:   SIDNEE GAMBRILL Date of Exam: 05/31/2021 Medical Rec #:  [id number]         Height:       67.5 in Accession #:    [id number]        Weight:       197.0 lb Date of Birth:  11-Mar-1971         BSA:          2.020 m Patient Age:    50 years          BP:           131/84 mmHg Patient Gender: F                 HR:           84 bpm. Exam Location:  Inpatient Procedure: 2D Echo, Cardiac Doppler and Color Doppler Indications:    Elevated troponin  History:        Patient has prior history of Echocardiogram examinations, most                 recent 11/16/2019. COPD and Stroke;  Risk Factors:Diabetes and                 Hypertension.  Sonographer:    Neomia Dear RDCS Referring Phys: [id number] Sc[facility] J PATWARDHAN IMPRESSIONS  1. Left ventricular ejection fraction, by estimation, is 55 to 60%. The left ventricle has normal function. The left ventricle has no regional wall motion abnormalities. There is mild left ventricular hypertrophy. Left ventricular diastolic parameters are consistent with Grade I diastolic dysfunction (impaired relaxation).  2. Right ventricular systolic function is normal. The right ventricular size is normal. Tricuspid regurgitation signal is inadequate for assessing PA pressure.  3. The mitral valve is normal in structure. No evidence of mitral valve regurgitation. No evidence of mitral stenosis.  4. The aortic valve was not well visualized. Aortic valve regurgitation is not visualized. No aortic stenosis is present.  5. The inferior vena cava is normal in size with greater than 50% respiratory variability, suggesting right atrial pressure of 3 mmHg. FINDINGS  Left Ventricle: Left ventricular ejection fraction, by estimation, is 55 to 60%. The left ventricle has normal function. The left ventricle has no regional wall motion abnormalities. The left ventricular internal cavity size was normal in size. There is  mild left ventricular hypertrophy. Left  ventricular diastolic parameters are consistent with Grade I diastolic dysfunction (impaired relaxation). Right Ventricle: The right ventricular size is normal. Right ventricular systolic function is normal. Tricuspid regurgitation signal is inadequate for assessing PA pressure. The tricuspid regurgitant velocity is 2.21 m/s, and with an assumed right atrial  pressure of 3 mmHg, the estimated right ventricular systolic pressure is 22.5 mmHg. Left Atrium: Left atrial size was normal in size. Right Atrium: Right atrial size was normal in size. Pericardium: There is no evidence of pericardial effusion. Mitral Valve: The mitral valve is normal in structure. No evidence of mitral valve regurgitation. No evidence of mitral valve stenosis. Tricuspid Valve: The tricuspid valve is normal in structure. Tricuspid valve regurgitation is trivial. No evidence of tricuspid stenosis. Aortic Valve: The aortic valve was not well visualized. Aortic valve regurgitation is not visualized. No aortic stenosis is present. Aortic valve mean gradient measures 3.0 mmHg. Aortic valve peak gradient measures 5.7 mmHg. Aortic valve area, by VTI measures 2.51 cm. Pulmonic Valve: The pulmonic valve was not well visualized. Pulmonic valve regurgitation is not visualized. Aorta: The aortic root is normal in size and structure. Venous: The inferior vena cava is normal in size with greater than 50% respiratory variability, suggesting right atrial pressure of 3 mmHg. IAS/Shunts: No atrial level shunt detected by color flow Doppler.  LEFT VENTRICLE PLAX 2D LVIDd:         3.40 cm     Diastology LVIDs:         1.90 cm     LV e' medial:    5.11 cm/s LV PW:         1.40 cm     LV E/e' medial:  13.0 LV IVS:        1.20 cm     LV e' lateral:   5.66 cm/s LVOT diam:     2.10 cm     LV E/e' lateral: 11.7 LV SV:         54 LV SV Index:   27 LVOT Area:     3.46 cm  LV Volumes (MOD) LV vol d, MOD A2C: 55.6 ml LV vol d, MOD A4C: 65.8 ml LV vol s, MOD A2C: 29.8 ml  LV vol s, MOD  A4C: 30.0 ml LV SV MOD A2C:     25.8 ml LV SV MOD A4C:     65.8 ml LV SV MOD BP:      31.6 ml RIGHT VENTRICLE RV S prime:     10.40 cm/s TAPSE (M-mode): 1.7 cm LEFT ATRIUM             Index       RIGHT ATRIUM           Index LA diam:        3.30 cm 1.63 cm/m  RA Area:     13.30 cm LA Vol (A2C):   48.9 ml 24.20 ml/m RA Volume:   33.30 ml  16.48 ml/m LA Vol (A4C):   20.0 ml 9.90 ml/m LA Biplane Vol: 32.5 ml 16.09 ml/m  AORTIC VALVE AV Area (Vmax):    2.56 cm AV Area (Vmean):   2.58 cm AV Area (VTI):     2.51 cm AV Vmax:           119.00 cm/s AV Vmean:          78.500 cm/s AV VTI:            0.215 m AV Peak Grad:      5.7 mmHg AV Mean Grad:      3.0 mmHg LVOT Vmax:         88.10 cm/s LVOT Vmean:        58.500 cm/s LVOT VTI:          0.156 m LVOT/AV VTI ratio: 0.73  AORTA Ao Root diam: 3.20 cm MITRAL VALVE               TRICUSPID VALVE MV Area (PHT): 4.17 cm    TR Peak grad:   19.5 mmHg MV Decel Time: 182 msec    TR Vmax:        221.00 cm/s MV E velocity: 66.20 cm/s MV A velocity: 89.70 cm/s  SHUNTS MV E/A ratio:  0.74        Systemic VTI:  0.16 m                            Systemic Diam: 2.10 cm Olga Millers MD Electronically signed by Olga Millers MD Signature Date/Time: 05/31/2021/1:53:16 PM    Final     Subjective: Eager to return to SNF  Discharge Exam: Vitals:   05/31/21 0812 05/31/21 1429  BP:  115/73  Pulse:  77  Resp:  18  Temp:  98.1 F (36.7 C)  SpO2: 91% 92%   Vitals:   05/30/21 2138 05/31/21 0415 05/31/21 0812 05/31/21 1429  BP: 98/72 131/84  115/73  Pulse: 79 75  77  Resp: Temp: 97.7 F (36.5 C) 97.7 F (36.5 C)  98.1 F (36.7 C)  TempSrc: Oral Oral  Oral  SpO2: 93% 91% 91% 92%  Weight:      Height:        General: Pt is alert, awake, not in acute distress Cardiovascular: RRR, S1/S2 +, no rubs, no gallops Respiratory: CTA bilaterally, no wheezing, no rhonchi Abdominal: Soft, NT, ND, bowel sounds + Extremities: no edema, no  cyanosis   The results of significant diagnostics from this hospitalization (including imaging, microbiology, ancillary and laboratory) are listed below for reference.     Microbiology: Recent Results (from the past 240 hour(s))  Resp Panel by RT-PCR (Flu A&B, Covid) Nasopharyngeal Swab  Status: None   Collection Time: 05/29/21  3:06 PM   Specimen: Nasopharyngeal Swab; Nasopharyngeal(NP) swabs in vial transport medium  Result Value Ref Range Status   SARS Coronavirus 2 by RT PCR NEGATIVE NEGATIVE Final    Comment: (NOTE) SARS-CoV-2 target nucleic acids are NOT DETECTED.  The SARS-CoV-2 RNA is generally detectable in upper respiratory specimens during the acute phase of infection. The lowest concentration of SARS-CoV-2 viral copies this assay can detect is 138 copies/mL. A negative result does not preclude SARS-Cov-2 infection and should not be used as the sole basis for treatment or other patient management decisions. A negative result may occur with  improper specimen collection/handling, submission of specimen other than nasopharyngeal swab, presence of viral mutation(s) within the areas targeted by this assay, and inadequate number of viral copies(<138 copies/mL). A negative result must be combined with clinical observations, patient history, and epidemiological information. The expected result is Negative.  Fact Sheet for Patients:  BloggerCourse.comhttps://www.fda.gov/media/152166/download  Fact Sheet for Healthcare Providers:  SeriousBroker.ithttps://www.fda.gov/media/152162/download  This test is no t yet approved or cleared by the [place] FDA and  has been authorized for detection and/or diagnosis of SARS-CoV-2 by FDA under an Emergency Use Authorization (EUA). This EUA will remain  in effect (meaning this test can be used) for the duration of the COVID-19 declaration under Section 564(b)(1) of the Act, 21 U.S.C.section 360bbb-3(b)(1), unless the authorization is terminated  or revoked  sooner.       Influenza A by PCR NEGATIVE NEGATIVE Final   Influenza B by PCR NEGATIVE NEGATIVE Final    Comment: (NOTE) The Xpert Xpress SARS-CoV-2/FLU/RSV plus assay is intended as an aid in the diagnosis of influenza from Nasopharyngeal swab specimens and should not be used as a sole basis for treatment. Nasal washings and aspirates are unacceptable for Xpert Xpress SARS-CoV-2/FLU/RSV testing.  Fact Sheet for Patients: BloggerCourse.comhttps://www.fda.gov/media/152166/download  Fact Sheet for Healthcare Providers: SeriousBroker.ithttps://www.fda.gov/media/152162/download  This test is not yet approved or cleared by the [place] FDA and has been authorized for detection and/or diagnosis of SARS-CoV-2 by FDA under an Emergency Use Authorization (EUA). This EUA will remain in effect (meaning this test can be used) for the duration of the COVID-19 declaration under Section 564(b)(1) of the Act, 21 U.S.C. section 360bbb-3(b)(1), unless the authorization is terminated or revoked.  Performed at [facility], [address] [place], [place] [address]   Resp Panel by RT-PCR (Flu A&B, Covid) Nasopharyngeal Swab     Status: None   Collection Time: 05/31/21  1:54 PM   Specimen: Nasopharyngeal Swab; Nasopharyngeal(NP) swabs in vial transport medium  Result Value Ref Range Status   SARS Coronavirus 2 by RT PCR NEGATIVE NEGATIVE Final    Comment: (NOTE) SARS-CoV-2 target nucleic acids are NOT DETECTED.  The SARS-CoV-2 RNA is generally detectable in upper respiratory specimens during the acute phase of infection. The lowest concentration of SARS-CoV-2 viral copies this assay can detect is 138 copies/mL. A negative result does not preclude SARS-Cov-2 infection and should not be used as the sole basis for treatment or other patient management decisions. A negative result may occur with  improper specimen collection/handling, submission of specimen other than nasopharyngeal swab, presence of  viral mutation(s) within the areas targeted by this assay, and inadequate number of viral copies(<138 copies/mL). A negative result must be combined with clinical observations, patient history, and epidemiological information. The expected result is Negative.  Fact Sheet for Patients:  BloggerCourse.comhttps://www.fda.gov/media/152166/download  Fact Sheet for Healthcare Providers:  SeriousBroker.it  This test is no t yet approved or cleared by the [place] and  has been authorized for detection and/or diagnosis of SARS-CoV-2 by FDA under an Emergency Use Authorization (EUA). This EUA will remain  in effect (meaning this test can be used) for the duration of the COVID-19 declaration under Section 564(b)(1) of the Act, 21 U.S.C.section 360bbb-3(b)(1), unless the authorization is terminated  or revoked sooner.       Influenza A by PCR NEGATIVE NEGATIVE Final   Influenza B by PCR NEGATIVE NEGATIVE Final    Comment: (NOTE) The Xpert Xpress SARS-CoV-2/FLU/RSV plus assay is intended as an aid in the diagnosis of influenza from Nasopharyngeal swab specimens and should not be used as a sole basis for treatment. Nasal washings and aspirates are unacceptable for Xpert Xpress SARS-CoV-2/FLU/RSV testing.  Fact Sheet for Patients: BloggerCourse.com  Fact Sheet for Healthcare Providers: SeriousBroker.it  This test is not yet approved or cleared by the [place] FDA and has been authorized for detection and/or diagnosis of SARS-CoV-2 by FDA under an Emergency Use Authorization (EUA). This EUA will remain in effect (meaning this test can be used) for the duration of the COVID-19 declaration under Section 564(b)(1) of the Act, 21 U.S.C. section 360bbb-3(b)(1), unless the authorization is terminated or revoked.  Performed at [facility], [address] [place], [place] [address]      Labs: BNP  (last 3 results) No results for input(s): BNP in the last 8760 hours. Basic Metabolic Panel: Recent Labs  Lab 05/29/21 0845 05/30/21 0418  NA 132* 135  K 5.2* 4.6  CL 99 101  CO2 25 26  GLUCOSE 297* 190*  BUN 24* 20  CREATININE 1.24* 1.13*  CALCIUM 9.6 9.9   Liver Function Tests: Recent Labs  Lab 05/29/21 0845 05/30/21 0418  AST 28 28  ALT 22 21  ALKPHOS 101 91  BILITOT 0.5 0.4  PROT 7.9 6.8  ALBUMIN 4.0 3.4*   No results for input(s): LIPASE, AMYLASE in the last 168 hours. No results for input(s): AMMONIA in the last 168 hours. CBC: Recent Labs  Lab 05/29/21 0845  WBC 8.3  NEUTROABS 4.2  HGB 13.9  HCT 42.4  MCV 83.3  PLT 221   Cardiac Enzymes: No results for input(s): CKTOTAL, CKMB, CKMBINDEX, TROPONINI in the last 168 hours. BNP: Invalid input(s): POCBNP CBG: Recent Labs  Lab 05/30/21 1223 05/30/21 1601 05/30/21 2133 05/31/21 0810 05/31/21 1152  GLUCAP 219* 346* 183* 223* 217*   D-Dimer No results for input(s): DDIMER in the last 72 hours. Hgb A1c Recent Labs    05/29/21 1618  HGBA1C 9.9*   Lipid Profile Recent Labs    05/30/21 0418  CHOL 162  HDL 28*  LDLCALC 74  TRIG 604*  CHOLHDL 5.8   Thyroid function studies No results for input(s): TSH, T4TOTAL, T3FREE, THYROIDAB in the last 72 hours.  Invalid input(s): FREET3 Anemia work up No results for input(s): VITAMINB12, FOLATE, FERRITIN, TIBC, IRON, RETICCTPCT in the last 72 hours. Urinalysis    Component Value Date/Time   COLORURINE AMBER (A) 04/01/2021 1510   APPEARANCEUR CLEAR 04/01/2021 1510   APPEARANCEUR Clear 11/24/2012 1238   LABSPEC 1.006 04/01/2021 1510   LABSPEC 1.000 11/24/2012 1238   PHURINE 5.0 04/01/2021 1510   GLUCOSEU NEGATIVE 04/01/2021 1510   GLUCOSEU >=500 11/24/2012 1238   HGBUR NEGATIVE 04/01/2021 1510   BILIRUBINUR NEGATIVE 04/01/2021 1510   BILIRUBINUR Negative 11/24/2012 1238   KETONESUR NEGATIVE 04/01/2021 1510  PROTEINUR NEGATIVE 04/01/2021 1510    UROBILINOGEN 1.0 04/30/2010 0902   NITRITE POSITIVE (A) 04/01/2021 1510   LEUKOCYTESUR NEGATIVE 04/01/2021 1510   LEUKOCYTESUR Negative 11/24/2012 1238   Sepsis Labs Invalid input(s): PROCALCITONIN,  WBC,  LACTICIDVEN Microbiology Recent Results (from the past 240 hour(s))  Resp Panel by RT-PCR (Flu A&B, Covid) Nasopharyngeal Swab     Status: None   Collection Time: 05/29/21  3:06 PM   Specimen: Nasopharyngeal Swab; Nasopharyngeal(NP) swabs in vial transport medium  Result Value Ref Range Status   SARS Coronavirus 2 by RT PCR NEGATIVE NEGATIVE Final    Comment: (NOTE) SARS-CoV-2 target nucleic acids are NOT DETECTED.  The SARS-CoV-2 RNA is generally detectable in upper respiratory specimens during the acute phase of infection. The lowest concentration of SARS-CoV-2 viral copies this assay can detect is 138 copies/mL. A negative result does not preclude SARS-Cov-2 infection and should not be used as the sole basis for treatment or other patient management decisions. A negative result may occur with  improper specimen collection/handling, submission of specimen other than nasopharyngeal swab, presence of viral mutation(s) within the areas targeted by this assay, and inadequate number of viral copies(<138 copies/mL). A negative result must be combined with clinical observations, patient history, and epidemiological information. The expected result is Negative.  Fact Sheet for Patients:  BloggerCourse.com  Fact Sheet for Healthcare Providers:  SeriousBroker.it  This test is no t yet approved or cleared by the [place] FDA and  has been authorized for detection and/or diagnosis of SARS-CoV-2 by FDA under an Emergency Use Authorization (EUA). This EUA will remain  in effect (meaning this test can be used) for the duration of the COVID-19 declaration under Section 564(b)(1) of the Act, 21 U.S.C.section 360bbb-3(b)(1), unless  the authorization is terminated  or revoked sooner.       Influenza A by PCR NEGATIVE NEGATIVE Final   Influenza B by PCR NEGATIVE NEGATIVE Final    Comment: (NOTE) The Xpert Xpress SARS-CoV-2/FLU/RSV plus assay is intended as an aid in the diagnosis of influenza from Nasopharyngeal swab specimens and should not be used as a sole basis for treatment. Nasal washings and aspirates are unacceptable for Xpert Xpress SARS-CoV-2/FLU/RSV testing.  Fact Sheet for Patients: BloggerCourse.com  Fact Sheet for Healthcare Providers: SeriousBroker.it  This test is not yet approved or cleared by the [place] FDA and has been authorized for detection and/or diagnosis of SARS-CoV-2 by FDA under an Emergency Use Authorization (EUA). This EUA will remain in effect (meaning this test can be used) for the duration of the COVID-19 declaration under Section 564(b)(1) of the Act, 21 U.S.C. section 360bbb-3(b)(1), unless the authorization is terminated or revoked.  Performed at [facility], [address], [place], [place] [address]   Resp Panel by RT-PCR (Flu A&B, Covid) Nasopharyngeal Swab     Status: None   Collection Time: 05/31/21  1:54 PM   Specimen: Nasopharyngeal Swab; Nasopharyngeal(NP) swabs in vial transport medium  Result Value Ref Range Status   SARS Coronavirus 2 by RT PCR NEGATIVE NEGATIVE Final    Comment: (NOTE) SARS-CoV-2 target nucleic acids are NOT DETECTED.  The SARS-CoV-2 RNA is generally detectable in upper respiratory specimens during the acute phase of infection. The lowest concentration of SARS-CoV-2 viral copies this assay can detect is 138 copies/mL. A negative result does not preclude SARS-Cov-2 infection and should not be used as the sole basis for treatment or other patient management decisions. A negative result may occur with  improper specimen collection/handling, submission of specimen  other than nasopharyngeal swab, presence of viral mutation(s) within the areas targeted by this assay, and inadequate number of viral copies(<138 copies/mL). A negative result must be combined with clinical observations, patient history, and epidemiological information. The expected result is Negative.  Fact Sheet for Patients:  BloggerCourse.com  Fact Sheet for Healthcare Providers:  SeriousBroker.it  This test is no t yet approved or cleared by the [place] FDA and  has been authorized for detection and/or diagnosis of SARS-CoV-2 by FDA under an Emergency Use Authorization (EUA). This EUA will remain  in effect (meaning this test can be used) for the duration of the COVID-19 declaration under Section 564(b)(1) of the Act, 21 U.S.C.section 360bbb-3(b)(1), unless the authorization is terminated  or revoked sooner.       Influenza A by PCR NEGATIVE NEGATIVE Final   Influenza B by PCR NEGATIVE NEGATIVE Final    Comment: (NOTE) The Xpert Xpress SARS-CoV-2/FLU/RSV plus assay is intended as an aid in the diagnosis of influenza from Nasopharyngeal swab specimens and should not be used as a sole basis for treatment. Nasal washings and aspirates are unacceptable for Xpert Xpress SARS-CoV-2/FLU/RSV testing.  Fact Sheet for Patients: BloggerCourse.com  Fact Sheet for Healthcare Providers: SeriousBroker.it  This test is not yet approved or cleared by the [place] FDA and has been authorized for detection and/or diagnosis of SARS-CoV-2 by FDA under an Emergency Use Authorization (EUA). This EUA will remain in effect (meaning this test can be used) for the duration of the COVID-19 declaration under Section 564(b)(1) of the Act, 21 U.S.C. section 360bbb-3(b)(1), unless the authorization is terminated or revoked.  Performed at [facility], [address] [place], [place] [address]    Time spent: 30 min  SIGNED:   Rickey Barbara, MD  [facility] 05/31/2021, 3:39 PM  If 7PM-7AM, please contact night-coverage

## 2021-05-31 NOTE — Progress Notes (Signed)
*  PRELIMINARY RESULTS* Echocardiogram 2D Echocardiogram has been performed.  Neomia Dear RDCS 05/31/2021, 1:41 PM

## 2021-05-31 NOTE — Progress Notes (Signed)
Report called  to receiving facility. RN confirmed and acknowledged. PTAR called.

## 2021-05-31 NOTE — [place] FL2 (Signed)
[place] MEDICAID FL2 LEVEL OF CARE SCREENING TOOL     IDENTIFICATION  Patient Name: Karla Hoover Birthdate: 1971-01-12 Sex: female Admission Date (Current Location): 05/29/2021  [place] and [place] Number:  Haynes Bast [id number] Facility and Address:  [facility],  [address], [place] [address]      Provider Number: [id number]  Attending Physician Name and Address:  Jerald Kief, MD  Relative Name and Phone Number:  Thomasena Edis Mother   [phone]    Current Level of Care: Hospital Recommended Level of Care: Skilled Nursing Facility Prior Approval Number:    Date Approved/Denied:   PASRR Number: [id number]  Discharge Plan: SNF    Current Diagnoses: Patient Active Problem List   Diagnosis Date Noted   Chest pain 05/29/2021   Acute renal failure superimposed on stage 3a chronic kidney disease (HCC) 02/09/2021   Chronic respiratory failure with hypoxia (HCC) 02/09/2021   History of thromboembolism 02/09/2021   Type 2 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 disease, with long-term current use of insulin (HCC) 02/09/2021   Mixed 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 (HCC) 02/09/2021   GERD without esophagitis 02/09/2021   Hypothyroidism 02/09/2021   Pyelonephritis of right kidney 02/09/2021   Sepsis secondary to UTI (HCC) 02/08/2021   Lymphadenopathy 12/28/2020   VTE (venous thromboembolism) 12/01/2019   Overweight (BMI 25.0-29.9) 11/22/2019   Chronic constipation 11/22/2019   Tobacco abuse 11/16/2019   Elevated troponin 11/16/2019   Bilateral pulmonary embolism (HCC) 11/15/2019   DM (diabetes mellitus), type 2 (HCC) 11/15/2019   History of stroke 11/15/2019   Essential hypertension 05/04/2009   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    Orientation RESPIRATION BLADDER Height & Weight     Self, Time, Situation, Place  Normal Continent Weight: 197 lb (89.4 kg) Height:  5' 7.5" (171.5 cm)   BEHAVIORAL SYMPTOMS/MOOD NEUROLOGICAL BOWEL NUTRITION STATUS      Continent Diet (Regular)  AMBULATORY STATUS COMMUNICATION OF NEEDS Skin   Limited Assist Verbally Normal                       Personal Care Assistance Level of Assistance  Bathing, Feeding, Dressing Bathing Assistance: Limited assistance Feeding assistance: Limited assistance Dressing Assistance: Limited assistance     Functional Limitations Info  Sight, Speech, Hearing Sight Info: Adequate Hearing Info: Adequate Speech Info: Adequate    SPECIAL CARE FACTORS FREQUENCY                       Contractures Contractures Info: Not present    Additional Factors Info  Code Status, Allergies Code Status Info: Full Code Allergies Info: Guaifenesin   Kiwi Extract   Levaquin (Levofloxacin In D5w)   Strawberry Extract   Baclofen           Current Medications (05/31/2021):  This is the current hospital active medication list Current Facility-Administered Medications  Medication Dose Route Frequency Provider Last Rate Last Admin   acetaminophen (TYLENOL) tablet 650 mg  650 mg Oral Q6H PRN Howington, Kelle Darting, MD       Or   acetaminophen (TYLENOL) suppository 650 mg  650 mg Rectal Q6H PRN Howington, Kelle Darting, MD       albuterol (PROVENTIL) (2.5 MG/3ML) 0.083% nebulizer solution 2.5 mg  2.5 mg Nebulization Q6H PRN Howington, Kelle Darting, MD       apixaban Everlene Balls) tablet 5 mg  5 mg Oral Q12H Howington, Kelle Darting,  MD   5 mg at 05/31/21 2130   aspirin chewable tablet 81 mg  81 mg Oral Daily Howington, Kelle Darting, MD   81 mg at 05/31/21 0941   atorvastatin (LIPITOR) tablet 80 mg  80 mg Oral Daily Howington, Kelle Darting, MD   80 mg at 05/31/21 0941   chlorhexidine (PERIDEX) 0.12 % solution 15 mL  15 mL Mouth/Throat Daily Howington, Kelle Darting, MD   15 mL at 05/31/21 0940   folic acid (FOLVITE) tablet 1 mg  1 mg Oral Daily Howington, Kelle Darting, MD   1 mg at 05/31/21 8657   gabapentin (NEURONTIN) capsule 300 mg  300 mg Oral TID  Darnelle Catalan, MD   300 mg at 05/31/21 8469   HYDROcodone-acetaminophen (NORCO/VICODIN) 5-325 MG per tablet 1-2 tablet  1-2 tablet Oral Q4H PRN Darnelle Catalan, MD   2 tablet at 05/31/21 1231   hydrOXYzine (ATARAX/VISTARIL) tablet 10 mg  10 mg Oral BID PRN Howington, Kelle Darting, MD       insulin aspart (novoLOG) injection 0-15 Units  0-15 Units Subcutaneous TID WC Howington, Kelle Darting, MD   5 Units at 05/31/21 1231   insulin glargine-yfgn (SEMGLEE) injection 10 Units  10 Units Subcutaneous QHS Howington, Kelle Darting, MD   10 Units at 05/30/21 2300   levothyroxine (SYNTHROID) tablet 75 mcg  75 mcg Oral QAC breakfast Darnelle Catalan, MD   75 mcg at 05/31/21 6295   losartan (COZAAR) tablet 75 mg  75 mg Oral Daily Howington, Kelle Darting, MD   75 mg at 05/31/21 0941   metoprolol tartrate (LOPRESSOR) tablet 25 mg  25 mg Oral BID Darnelle Catalan, MD   25 mg at 05/31/21 0941   mometasone-formoterol (DULERA) 100-5 MCG/ACT inhaler 2 puff  2 puff Inhalation BID Darnelle Catalan, MD   2 puff at 05/31/21 2841   morphine 2 MG/ML injection 2 mg  2 mg Intravenous Q2H PRN Howington, Kelle Darting, MD       omega-3 acid ethyl esters (LOVAZA) capsule 1 g  1 g Oral Daily Howington, Kelle Darting, MD   1 g at 05/31/21 0942   ondansetron (ZOFRAN) tablet 4 mg  4 mg Oral Q6H PRN Howington, Kelle Darting, MD       Or   ondansetron [facility]) injection 4 mg  4 mg Intravenous Q6H PRN Howington, Kelle Darting, MD       pantoprazole (PROTONIX) EC tablet 40 mg  40 mg Oral Daily Howington, Kelle Darting, MD   40 mg at 05/31/21 3244   senna-docusate (Senokot-S) tablet 2 tablet  2 tablet Oral BID Darnelle Catalan, MD   2 tablet at 05/31/21 0102   vitamin B-12 (CYANOCOBALAMIN) tablet 500 mcg  500 mcg Oral Daily Howington, Kelle Darting, MD   500 mcg at 05/31/21 0940     Discharge Medications: Please see discharge summary for a list of discharge medications.  Relevant Imaging Results:  Relevant Lab Results:   Additional Information SSN  [id number]  Darleene Cleaver, LCSW

## 2021-05-31 NOTE — Progress Notes (Addendum)
Inpatient Diabetes Program Recommendations  AACE/ADA: New Consensus Statement on Inpatient Glycemic Control (2015)  Target Ranges:  Prepandial:   less than 140 mg/dL      Peak postprandial:   less than 180 mg/dL (1-2 hours)      Critically ill patients:  140 - 180 mg/dL   Lab Results  Component Value Date   GLUCAP 223 (H) 05/31/2021   HGBA1C 9.9 (H) 05/29/2021    Review of Glycemic Control Results for Karla Hoover, Karla Hoover (MRN [id number]) as of 05/31/2021 10:33  Ref. Range 05/30/2021 07:46 05/30/2021 12:23 05/30/2021 16:01 05/30/2021 21:33 05/31/2021 08:10  Glucose-Capillary Latest Ref Range: 70 - 99 mg/dL 224 (H) 825 (H) 003 (H) 183 (H) 223 (H)   Diabetes history: DM 2 Outpatient Diabetes medications: Lantus 35 units Daily, Humalog 2-14 units tid, Tradjenta 5 mg Daily Current orders for Inpatient glycemic control:  Semglee 10 units qhs Novolog 0-15 units tid  A1c 9.9% on 8/23 from SNF  Inpatient Diabetes Program Recommendations:    Glucose trends increase after meal intake  -  Add Novolog 5 units tid meal coverage if eating >50% of meals  Thanks,  Christena Deem RN, MSN, BC-ADM Inpatient Diabetes Coordinator Team Pager [phone] (8a-5p)

## 2021-11-09 ENCOUNTER — Emergency Department (HOSPITAL_COMMUNITY)
Admission: EM | Admit: 2021-11-09 | Discharge: 2021-11-10 | Disposition: A | Discharge status: Skilled Nursing Facility | Payer: Medicaid Other | Attending: Emergency Medicine | Admitting: Emergency Medicine

## 2021-11-09 ENCOUNTER — Encounter (HOSPITAL_COMMUNITY): Payer: Self-pay | Admitting: Emergency Medicine

## 2021-11-09 ENCOUNTER — Emergency Department (HOSPITAL_COMMUNITY): Payer: Medicaid Other

## 2021-11-09 ENCOUNTER — Other Ambulatory Visit: Payer: Self-pay

## 2021-11-09 DIAGNOSIS — Z7982 Long term (current) use of aspirin: Secondary | ICD-10-CM | POA: Diagnosis not present

## 2021-11-09 DIAGNOSIS — N9489 Other specified conditions associated with female genital organs and menstrual cycle: Secondary | ICD-10-CM | POA: Diagnosis not present

## 2021-11-09 DIAGNOSIS — R531 Weakness: Secondary | ICD-10-CM | POA: Diagnosis present

## 2021-11-09 DIAGNOSIS — I10 Essential (primary) hypertension: Secondary | ICD-10-CM | POA: Diagnosis not present

## 2021-11-09 DIAGNOSIS — F17210 Nicotine dependence, cigarettes, uncomplicated: Secondary | ICD-10-CM | POA: Insufficient documentation

## 2021-11-09 DIAGNOSIS — Z794 Long term (current) use of insulin: Secondary | ICD-10-CM | POA: Insufficient documentation

## 2021-11-09 DIAGNOSIS — Z79899 Other long term (current) drug therapy: Secondary | ICD-10-CM | POA: Insufficient documentation

## 2021-11-09 DIAGNOSIS — Z7901 Long term (current) use of anticoagulants: Secondary | ICD-10-CM | POA: Insufficient documentation

## 2021-11-09 DIAGNOSIS — N179 Acute kidney failure, unspecified: Secondary | ICD-10-CM

## 2021-11-09 DIAGNOSIS — Z7984 Long term (current) use of oral hypoglycemic drugs: Secondary | ICD-10-CM | POA: Insufficient documentation

## 2021-11-09 DIAGNOSIS — N3000 Acute cystitis without hematuria: Secondary | ICD-10-CM

## 2021-11-09 LAB — CBC WITH DIFFERENTIAL/PLATELET
Abs Immature Granulocytes: 0.04 10*3/uL (ref 0.00–0.07)
Basophils Absolute: 0 10*3/uL (ref 0.0–0.1)
Basophils Relative: 0 %
Eosinophils Absolute: 0.1 10*3/uL (ref 0.0–0.5)
Eosinophils Relative: 1 %
HCT: 39.8 % (ref 36.0–46.0)
Hemoglobin: 12.9 g/dL (ref 12.0–15.0)
Immature Granulocytes: 0 %
Lymphocytes Relative: 34 %
Lymphs Abs: 3.2 10*3/uL (ref 0.7–4.0)
MCH: 27.6 pg (ref 26.0–34.0)
MCHC: 32.4 g/dL (ref 30.0–36.0)
MCV: 85.2 fL (ref 80.0–100.0)
Monocytes Absolute: 0.5 10*3/uL (ref 0.1–1.0)
Monocytes Relative: 5 %
Neutro Abs: 5.7 10*3/uL (ref 1.7–7.7)
Neutrophils Relative %: 60 %
Platelets: 262 10*3/uL (ref 150–400)
RBC: 4.67 MIL/uL (ref 3.87–5.11)
RDW: 12.9 % (ref 11.5–15.5)
WBC: 9.6 10*3/uL (ref 4.0–10.5)
nRBC: 0 % (ref 0.0–0.2)

## 2021-11-09 LAB — COMPREHENSIVE METABOLIC PANEL
ALT: 13 U/L (ref 0–44)
AST: 23 U/L (ref 15–41)
Albumin: 3.2 g/dL — ABNORMAL LOW (ref 3.5–5.0)
Alkaline Phosphatase: 91 U/L (ref 38–126)
Anion gap: 8 (ref 5–15)
BUN: 17 mg/dL (ref 6–20)
CO2: 25 mmol/L (ref 22–32)
Calcium: 9.1 mg/dL (ref 8.9–10.3)
Chloride: 100 mmol/L (ref 98–111)
Creatinine, Ser: 1.13 mg/dL — ABNORMAL HIGH (ref 0.44–1.00)
GFR, Estimated: 60 mL/min — ABNORMAL LOW (ref >60)
Glucose, Bld: 194 mg/dL — ABNORMAL HIGH (ref 70–99)
Potassium: 5.1 mmol/L (ref 3.5–5.1)
Sodium: 133 mmol/L — ABNORMAL LOW (ref 135–145)
Total Bilirubin: 1.5 mg/dL — ABNORMAL HIGH (ref 0.3–1.2)
Total Protein: 6.8 g/dL (ref 6.5–8.1)

## 2021-11-09 LAB — URINALYSIS, MICROSCOPIC (REFLEX)

## 2021-11-09 LAB — URINALYSIS, ROUTINE W REFLEX MICROSCOPIC
Bilirubin Urine: NEGATIVE
Glucose, UA: NEGATIVE mg/dL
Hgb urine dipstick: NEGATIVE
Ketones, ur: NEGATIVE mg/dL
Nitrite: NEGATIVE
Protein, ur: NEGATIVE mg/dL
Specific Gravity, Urine: 1.015 (ref 1.005–1.030)
pH: 6 (ref 5.0–8.0)

## 2021-11-09 LAB — RAPID URINE DRUG SCREEN, HOSP PERFORMED
Amphetamines: NOT DETECTED
Barbiturates: NOT DETECTED
Benzodiazepines: NOT DETECTED
Cocaine: NOT DETECTED
Opiates: NOT DETECTED
Tetrahydrocannabinol: NOT DETECTED

## 2021-11-09 LAB — PROTIME-INR
INR: 1.1 (ref 0.8–1.2)
Prothrombin Time: 14.3 seconds (ref 11.4–15.2)

## 2021-11-09 LAB — I-STAT BETA HCG BLOOD, ED (MC, WL, AP ONLY): I-stat hCG, quantitative: <5 m[IU]/mL (ref <5)

## 2021-11-09 LAB — ETHANOL: Alcohol, Ethyl (B): <10 mg/dL (ref <10)

## 2021-11-09 LAB — CBG MONITORING, ED: Glucose-Capillary: 185 mg/dL — ABNORMAL HIGH (ref 70–99)

## 2021-11-09 MED ORDER — CEPHALEXIN 500 MG PO CAPS: 500.0000 mg | ORAL_CAPSULE | Freq: Four times a day (QID) | ORAL | 0 refills | Status: AC

## 2021-11-09 MED ORDER — SODIUM CHLORIDE 0.9 % IV SOLN: INTRAVENOUS | Status: DC

## 2021-11-09 MED ORDER — SODIUM CHLORIDE 0.9 % IV BOLUS
1000.0000 mL | Freq: Once | INTRAVENOUS | Status: AC
  Administered 2021-11-09: 1000 mL via INTRAVENOUS

## 2021-11-09 MED ORDER — SODIUM CHLORIDE 0.9 % IV SOLN
2.0000 g | Freq: Once | INTRAVENOUS | Status: AC
  Administered 2021-11-09: 2 g via INTRAVENOUS
  Filled 2021-11-09: qty 20

## 2021-11-09 MED ORDER — KETOROLAC TROMETHAMINE 15 MG/ML IJ SOLN
15.0000 mg | Freq: Once | INTRAMUSCULAR | Status: AC
  Administered 2021-11-09: 15 mg via INTRAVENOUS
  Filled 2021-11-09: qty 1

## 2021-11-09 NOTE — ED Notes (Signed)
Pt ambulated to [facility] and urine sample collected.

## 2021-11-09 NOTE — ED Provider Notes (Signed)
[facility] EMERGENCY DEPARTMENT Provider Note   CSN: [id number] Arrival date & time: 11/09/21  1430     History  No chief complaint on file.   Karla Hoover is a 51 y.o. female.  HPI  Multiple medical issues presents via EMS from nursing facility with concern for weakness.  Patient describes feeling generally unwell.  She knowledges a history of prior stroke, hypertension, cigarette addiction, weakness seems to be left-sided where her prior deficits has been.  She states the nursing staff at the facility gives her her medication and she believes she has been taking everything regularly.  She denies new pain, does have some ongoing, possibly chronic discomfort left arm.    Home Medications Prior to Admission medications   Medication Sig Start Date End Date Taking? Authorizing Provider  acetaminophen (TYLENOL) 500 MG tablet Take 1,000 mg by mouth in the morning, at noon, and at bedtime.    [provider]  albuterol (VENTOLIN HFA) 108 (90 Base) MCG/ACT inhaler Inhale 2 puffs into the lungs every 6 (six) hours as needed for wheezing or shortness of breath. Patient taking differently: Inhale 2 puffs into the lungs every 6 (six) hours as needed for wheezing or shortness of breath ("COPD"). 10/04/19   Grayce Sessions, NP  apixaban (ELIQUIS) 5 MG TABS tablet Take 2 tablets (10mg ) twice daily for 7 days, then 1 tablet (5mg ) twice daily Patient taking differently: Take 5 mg by mouth every 12 (twelve) hours. 11/21/19   , DO  aspirin 81 MG chewable tablet Chew 81 mg by mouth in the morning.    [provider]  atorvastatin (LIPITOR) 80 MG tablet Take 1 tablet (80 mg total) by mouth daily. Patient taking differently: Take 80 mg by mouth every evening. 10/04/19   Darlin Drop, NP  chlorhexidine (PERIDEX) 0.12 % solution Use as directed 15 mLs in the mouth or throat daily.    [provider]  cyclobenzaprine (FLEXERIL) 10 MG  tablet Take 1 tablet (10 mg total) by mouth 3 (three) times daily as needed for muscle spasms. Patient taking differently: Take 10 mg by mouth See admin instructions. 10mg  at bedtime each night, may take an additional 10mg  three times a day as needed for muscle spasms 10/04/19   Grayce Sessions, NP  folic acid (FOLVITE) 800 MCG tablet Take 800 mcg by mouth daily.    [provider]  gabapentin (NEURONTIN) 300 MG capsule Take 1 capsule (300 mg total) by mouth 3 (three) times daily. Patient taking differently: Take 600 mg by mouth at bedtime. 11/11/19 07/14/21  10/06/19, NP  HYDROcodone-acetaminophen (NORCO/VICODIN) 5-325 MG tablet Take 2 tablets by mouth every 6 (six) hours as needed. Patient not taking: Reported on 05/29/2021 04/01/21   09/13/21, MD  hydrOXYzine (ATARAX/VISTARIL) 10 MG tablet Take 10 mg by mouth 2 (two) times daily as needed for anxiety.    [provider]  insulin glargine (LANTUS) 100 UNIT/ML injection Inject 35 Units into the skin daily.    [provider]  insulin lispro (HUMALOG) 100 UNIT/ML injection Inject 2-14 Units into the skin 3 (three) times daily with meals. Per sliding scale: CBG 201-250 = 2 Units, 251-300 = 4 units,301-350 = 6 units, 351-400 = 8 units, 401-450 = 10 units, 451-500 = 12 units, 501 = 14 units, recheck in 2 hours.    [provider]  levothyroxine (SYNTHROID) 75 MCG tablet Take 75 mcg by mouth daily before breakfast.  [provider]  lidocaine (LIDODERM) 5 % Place 1 patch onto the skin daily. Remove & Discard patch within 12 hours or as directed by MD 04/01/21   Arby Barrette, MD  linagliptin (TRADJENTA) 5 MG TABS tablet Take 5 mg by mouth daily.    [provider]  losartan (COZAAR) 50 MG tablet Take 75 mg by mouth daily.    [provider]  metoprolol tartrate (LOPRESSOR) 25 MG tablet Take 1 tablet (25 mg total) by mouth 2 (two) times daily. 10/04/19   Grayce Sessions,  NP  naproxen (NAPROSYN) 250 MG tablet Take 250 mg by mouth at bedtime.    [provider]  Omega-3 Fatty Acids (FISH OIL) 1000 MG CAPS Take 1,000 mg by mouth in the morning and at bedtime.    [provider]  omeprazole (PRILOSEC) 20 MG capsule Take 20 mg by mouth daily.    [provider]  SYMBICORT 80-4.5 MCG/ACT inhaler Inhale 2 puffs into the lungs 2 (two) times daily.    [provider]  vitamin B-12 (CYANOCOBALAMIN) 500 MCG tablet Take 500 mcg by mouth daily.    [provider]      Allergies    Guaifenesin, Kiwi extract, Levaquin [levofloxacin in d5w], Strawberry extract, and Baclofen    Review of Systems   Review of Systems  Constitutional:        Per HPI, otherwise negative  HENT:         Per HPI, otherwise negative  Respiratory:         Per HPI, otherwise negative  Cardiovascular:        Per HPI, otherwise negative  Gastrointestinal:  Negative for vomiting.  Endocrine:       Negative aside from HPI  Genitourinary:        Neg aside from HPI   Musculoskeletal:        Per HPI, otherwise negative  Skin: Negative.   Neurological:  Positive for weakness. Negative for syncope.   Physical Exam Updated Vital Signs LMP 10/20/2019    SpO2 94%  Physical Exam Vitals and nursing note reviewed.  Constitutional:      General: She is not in acute distress.    Appearance: She is well-developed.  HENT:     Head: Normocephalic and atraumatic.  Eyes:     Conjunctiva/sclera: Conjunctivae normal.  Cardiovascular:     Rate and Rhythm: Normal rate and regular rhythm.  Pulmonary:     Effort: Pulmonary effort is normal. No respiratory distress.     Breath sounds: Normal breath sounds. No stridor.  Abdominal:     General: There is no distension.  Skin:    General: Skin is warm and dry.  Neurological:     Mental Status: She is alert and oriented to person, place, and time.     Cranial Nerves: No cranial nerve deficit.     Comments:  Reaction slow, face is symmetric, speech is brief, but clear.  Strength seemingly symmetric, 4/5 upper extremities.    ED Results / Procedures / Treatments   Labs (all labs ordered are listed, but only abnormal results are displayed) Labs Reviewed  COMPREHENSIVE METABOLIC PANEL  RAPID URINE DRUG SCREEN, HOSP PERFORMED  ETHANOL  CBC WITH DIFFERENTIAL/PLATELET  PROTIME-INR  URINALYSIS, ROUTINE W REFLEX MICROSCOPIC  CBG MONITORING, ED  I-STAT BETA HCG BLOOD, ED ([facility], [facility], [facility] ONLY)    EKG None  Radiology No results found.  Procedures Procedures    Medications Ordered in  ED Medications  sodium chloride 0.9 % bolus 1,000 mL (has no administration in time range)    And  0.9 %  sodium chloride infusion (has no administration in time range)    ED Course/ Medical Decision Making/ A&P  This patient presents to the ED for concern of weakness, this involves an extensive number of treatment options, and is a complaint that carries with it a high risk of complications and morbidity.  The differential diagnosis includes stroke, dehydration, electrolyte abnormality, CNS dysfunction, peripheral neuropathy   Co morbidities that complicate the patient evaluation  Prior stroke, cigarette addiction, nursing home residency   Social Determinants of Health:  Prior stroke, nursing, residency, site.  Addiction to cigarettes   Additional history obtained:  Additional history and/or information obtained from chart review including summary from last month following admission for chest pain External records from outside source obtained and reviewed including discharge summary as above   After the initial evaluation, orders, including: Head CT, x-ray, labs were initiated.  Patient placed on Cardiac and Pulse-Oximetry Monitors. The patient was maintained on a cardiac monitor.  The cardiac monitored showed an rhythm of 75 sinus unremarkable The patient was also maintained on pulse oximetry. The  readings were typically 99% room air                          Adult female presents with weakness.  Context of prior stroke, multiple medical issues, but the patient is awake and alert, moving all extremities spontaneously, has generally reassuring neurologic exam with notation of her prior left-sided weakness.  Patient is awake, alert, afebrile, no early evidence for infection, on signout the patient is awaiting lab studies, though initial head CT, reviewed by me does not demonstrate acute new pathology.  X-ray, also interpreted by me does not demonstrate acute pathology either.   Signout patient is awaiting lab studies.  Dr. Wallace CullensGray is aware. Final Clinical Impression(s) / ED Diagnoses Final diagnoses:  Weakness     Gerhard MunchLockwood, Ketara Cavness, MD 11/09/21 1635

## 2021-11-09 NOTE — ED Triage Notes (Signed)
To ED via [facility] from [facility] with c/o "Feeling weak" - left side of face felt tingling , chest pain midsternal, under left breast. Aches all over. States was fine until this morning.   HX stroke 2 yrs ago with left sided residual weakness.

## 2021-11-09 NOTE — ED Provider Notes (Signed)
°  5:07 PM Patient signed out to me by previous ED physician. Patient presenting from nursing home for weakness.  CTH stable Pending labs and urine   Physical Exam  BP (!) 148/93    Pulse (!) 102    Temp 98.3 F (36.8 C) (Oral)    Resp 19    Ht 5' 7.5" (1.715 m)    Wt 91.2 kg    LMP 10/20/2019    SpO2 93%    BMI 31.02 kg/m   Physical Exam Vitals and nursing note reviewed.  Constitutional:      Appearance: Normal appearance.  HENT:     Head: Normocephalic and atraumatic.  Cardiovascular:     Rate and Rhythm: Normal rate and regular rhythm.  Pulmonary:     Effort: Pulmonary effort is normal.     Breath sounds: Normal breath sounds.  Abdominal:     General: Abdomen is flat.     Palpations: Abdomen is soft.  Skin:    Capillary Refill: Capillary refill takes less than 2 seconds.  Neurological:     Mental Status: She is alert and oriented to person, place, and time.    Procedures  Procedures  ED Course / MDM    Medical Decision Making Amount and/or Complexity of Data Reviewed Labs: ordered. Radiology: ordered.  Risk Prescription drug management.   Patient presenting for generalized weakness with no other significant symptoms.   Pertinent negatives: CT head demonstrates no acute process. No hx of bleeding or anemia. No s/s infection or sepsis. ECG stable with no ST segment elevation or depression. Troponin wnl. CXR demonstrates no acute process.   Pertinent positives: UA significant for UTI. Rocephin given in ED and keflex prescription sent home with patient. Evidence of minor AKI likely secondary to dehydration. IVF given. Pt have no flank pain at this time and is otherwise asymptomatic outside of generalized weakness. Doubt pyelonephritis. Doubt septic stone.   Patient in no distress and overall condition improved here in the ED. Detailed discussions were had with the patient regarding current findings, and need for close f/u with PCP or on call doctor. The patient has  been instructed to return immediately if the symptoms worsen in any way for re-evaluation. Patient verbalized understanding and is in agreement with current care plan. All questions answered prior to discharge.        Franne Forts, DO 11/09/21 2005

## 2021-11-10 NOTE — ED Notes (Signed)
Pt upset she was "moved to a hallway" while waiting for transport back to facility. Explained to patient the process but patient continues to yell

## 2021-12-02 ENCOUNTER — Encounter (HOSPITAL_COMMUNITY): Payer: Self-pay

## 2021-12-02 ENCOUNTER — Inpatient Hospital Stay (HOSPITAL_COMMUNITY)
Admission: EM | Admit: 2021-12-02 | Discharge: 2021-12-03 | DRG: 191 | Disposition: A | Discharge status: Skilled Nursing Facility | Payer: Medicaid Other | Source: Skilled Nursing Facility | Attending: Internal Medicine | Admitting: Internal Medicine

## 2021-12-02 ENCOUNTER — Other Ambulatory Visit: Payer: Self-pay

## 2021-12-02 ENCOUNTER — Emergency Department (HOSPITAL_COMMUNITY): Payer: Medicaid Other

## 2021-12-02 DIAGNOSIS — Z7982 Long term (current) use of aspirin: Secondary | ICD-10-CM

## 2021-12-02 DIAGNOSIS — E1122 Type 2 diabetes mellitus with diabetic chronic kidney disease: Secondary | ICD-10-CM

## 2021-12-02 DIAGNOSIS — E669 Obesity, unspecified: Secondary | ICD-10-CM | POA: Diagnosis present

## 2021-12-02 DIAGNOSIS — E782 Mixed hyperlipidemia: Secondary | ICD-10-CM | POA: Diagnosis present

## 2021-12-02 DIAGNOSIS — Z7984 Long term (current) use of oral hypoglycemic drugs: Secondary | ICD-10-CM

## 2021-12-02 DIAGNOSIS — E441 Mild protein-calorie malnutrition: Secondary | ICD-10-CM | POA: Diagnosis present

## 2021-12-02 DIAGNOSIS — I69354 Hemiplegia and hemiparesis following cerebral infarction affecting left non-dominant side: Secondary | ICD-10-CM | POA: Diagnosis not present

## 2021-12-02 DIAGNOSIS — Z91018 Allergy to other foods: Secondary | ICD-10-CM

## 2021-12-02 DIAGNOSIS — E1169 Type 2 diabetes mellitus with other specified complication: Secondary | ICD-10-CM | POA: Diagnosis present

## 2021-12-02 DIAGNOSIS — Z8673 Personal history of transient ischemic attack (TIA), and cerebral infarction without residual deficits: Secondary | ICD-10-CM

## 2021-12-02 DIAGNOSIS — K219 Gastro-esophageal reflux disease without esophagitis: Secondary | ICD-10-CM | POA: Diagnosis present

## 2021-12-02 DIAGNOSIS — Z6825 Body mass index (BMI) 25.0-25.9, adult: Secondary | ICD-10-CM

## 2021-12-02 DIAGNOSIS — A419 Sepsis, unspecified organism: Secondary | ICD-10-CM | POA: Diagnosis not present

## 2021-12-02 DIAGNOSIS — Z881 Allergy status to other antibiotic agents status: Secondary | ICD-10-CM

## 2021-12-02 DIAGNOSIS — I129 Hypertensive chronic kidney disease with stage 1 through stage 4 chronic kidney disease, or unspecified chronic kidney disease: Secondary | ICD-10-CM | POA: Diagnosis present

## 2021-12-02 DIAGNOSIS — J9611 Chronic respiratory failure with hypoxia: Secondary | ICD-10-CM | POA: Diagnosis present

## 2021-12-02 DIAGNOSIS — Z79899 Other long term (current) drug therapy: Secondary | ICD-10-CM | POA: Diagnosis not present

## 2021-12-02 DIAGNOSIS — I2699 Other pulmonary embolism without acute cor pulmonale: Secondary | ICD-10-CM | POA: Diagnosis present

## 2021-12-02 DIAGNOSIS — J441 Chronic obstructive pulmonary disease with (acute) exacerbation: Principal | ICD-10-CM

## 2021-12-02 DIAGNOSIS — Z7901 Long term (current) use of anticoagulants: Secondary | ICD-10-CM | POA: Diagnosis not present

## 2021-12-02 DIAGNOSIS — N1831 Chronic kidney disease, stage 3a: Secondary | ICD-10-CM

## 2021-12-02 DIAGNOSIS — I5189 Other ill-defined heart diseases: Secondary | ICD-10-CM

## 2021-12-02 DIAGNOSIS — Z833 Family history of diabetes mellitus: Secondary | ICD-10-CM | POA: Diagnosis not present

## 2021-12-02 DIAGNOSIS — Z825 Family history of asthma and other chronic lower respiratory diseases: Secondary | ICD-10-CM

## 2021-12-02 DIAGNOSIS — Z79890 Hormone replacement therapy (postmenopausal): Secondary | ICD-10-CM

## 2021-12-02 DIAGNOSIS — E039 Hypothyroidism, unspecified: Secondary | ICD-10-CM | POA: Diagnosis present

## 2021-12-02 DIAGNOSIS — Z86711 Personal history of pulmonary embolism: Secondary | ICD-10-CM

## 2021-12-02 DIAGNOSIS — Z794 Long term (current) use of insulin: Secondary | ICD-10-CM | POA: Diagnosis not present

## 2021-12-02 DIAGNOSIS — Z87891 Personal history of nicotine dependence: Secondary | ICD-10-CM

## 2021-12-02 DIAGNOSIS — I700 Atherosclerosis of aorta: Secondary | ICD-10-CM | POA: Diagnosis present

## 2021-12-02 DIAGNOSIS — Z20822 Contact with and (suspected) exposure to covid-19: Secondary | ICD-10-CM | POA: Diagnosis present

## 2021-12-02 DIAGNOSIS — I10 Essential (primary) hypertension: Secondary | ICD-10-CM | POA: Diagnosis present

## 2021-12-02 DIAGNOSIS — Z8249 Family history of ischemic heart disease and other diseases of the circulatory system: Secondary | ICD-10-CM

## 2021-12-02 HISTORY — DX: Other ill-defined heart diseases: I51.89

## 2021-12-02 LAB — CBC WITH DIFFERENTIAL/PLATELET
Abs Immature Granulocytes: 0.07 10*3/uL (ref 0.00–0.07)
Basophils Absolute: 0 10*3/uL (ref 0.0–0.1)
Basophils Relative: 0 %
Eosinophils Absolute: 0 10*3/uL (ref 0.0–0.5)
Eosinophils Relative: 0 %
HCT: 38.7 % (ref 36.0–46.0)
Hemoglobin: 12.3 g/dL (ref 12.0–15.0)
Immature Granulocytes: 1 %
Lymphocytes Relative: 8 %
Lymphs Abs: 1.1 10*3/uL (ref 0.7–4.0)
MCH: 27.4 pg (ref 26.0–34.0)
MCHC: 31.8 g/dL (ref 30.0–36.0)
MCV: 86.2 fL (ref 80.0–100.0)
Monocytes Absolute: 0.9 10*3/uL (ref 0.1–1.0)
Monocytes Relative: 7 %
Neutro Abs: 11.9 10*3/uL — ABNORMAL HIGH (ref 1.7–7.7)
Neutrophils Relative %: 84 %
Platelets: 243 10*3/uL (ref 150–400)
RBC: 4.49 MIL/uL (ref 3.87–5.11)
RDW: 13.2 % (ref 11.5–15.5)
WBC: 14.1 10*3/uL — ABNORMAL HIGH (ref 4.0–10.5)
nRBC: 0 % (ref 0.0–0.2)

## 2021-12-02 LAB — COMPREHENSIVE METABOLIC PANEL
ALT: 10 U/L (ref 0–44)
AST: 16 U/L (ref 15–41)
Albumin: 3.3 g/dL — ABNORMAL LOW (ref 3.5–5.0)
Alkaline Phosphatase: 92 U/L (ref 38–126)
Anion gap: 9 (ref 5–15)
BUN: 17 mg/dL (ref 6–20)
CO2: 25 mmol/L (ref 22–32)
Calcium: 8.7 mg/dL — ABNORMAL LOW (ref 8.9–10.3)
Chloride: 100 mmol/L (ref 98–111)
Creatinine, Ser: 1.36 mg/dL — ABNORMAL HIGH (ref 0.44–1.00)
GFR, Estimated: 48 mL/min — ABNORMAL LOW (ref >60)
Glucose, Bld: 241 mg/dL — ABNORMAL HIGH (ref 70–99)
Potassium: 4.1 mmol/L (ref 3.5–5.1)
Sodium: 134 mmol/L — ABNORMAL LOW (ref 135–145)
Total Bilirubin: 0.4 mg/dL (ref 0.3–1.2)
Total Protein: 7.4 g/dL (ref 6.5–8.1)

## 2021-12-02 LAB — URINALYSIS, ROUTINE W REFLEX MICROSCOPIC
Bilirubin Urine: NEGATIVE
Glucose, UA: 50 mg/dL — AB
Ketones, ur: NEGATIVE mg/dL
Nitrite: NEGATIVE
Protein, ur: 30 mg/dL — AB
Specific Gravity, Urine: 1.009 (ref 1.005–1.030)
WBC, UA: >50 WBC/hpf — ABNORMAL HIGH (ref 0–5)
pH: 5 (ref 5.0–8.0)

## 2021-12-02 LAB — RESP PANEL BY RT-PCR (FLU A&B, COVID) ARPGX2
Influenza A by PCR: NEGATIVE
Influenza B by PCR: NEGATIVE
SARS Coronavirus 2 by RT PCR: NEGATIVE

## 2021-12-02 LAB — LACTIC ACID, PLASMA
Lactic Acid, Venous: 2.3 mmol/L (ref 0.5–1.9)
Lactic Acid, Venous: 1.6 mmol/L (ref 0.5–1.9)

## 2021-12-02 LAB — HEMOGLOBIN A1C
Hgb A1c MFr Bld: 9.2 % — ABNORMAL HIGH (ref 4.8–5.6)
Mean Plasma Glucose: 217.34 mg/dL

## 2021-12-02 LAB — GLUCOSE, CAPILLARY
Glucose-Capillary: 384 mg/dL — ABNORMAL HIGH (ref 70–99)
Glucose-Capillary: 376 mg/dL — ABNORMAL HIGH (ref 70–99)

## 2021-12-02 LAB — PHOSPHORUS: Phosphorus: 1.7 mg/dL — ABNORMAL LOW (ref 2.5–4.6)

## 2021-12-02 LAB — PROTIME-INR
INR: 1.3 — ABNORMAL HIGH (ref 0.8–1.2)
Prothrombin Time: 16.3 seconds — ABNORMAL HIGH (ref 11.4–15.2)

## 2021-12-02 LAB — I-STAT BETA HCG BLOOD, ED (MC, WL, AP ONLY): I-stat hCG, quantitative: <5 m[IU]/mL (ref <5)

## 2021-12-02 LAB — APTT: aPTT: 33 seconds (ref 24–36)

## 2021-12-02 LAB — MAGNESIUM: Magnesium: 1.7 mg/dL (ref 1.7–2.4)

## 2021-12-02 LAB — CBG MONITORING, ED: Glucose-Capillary: 318 mg/dL — ABNORMAL HIGH (ref 70–99)

## 2021-12-02 MED ORDER — GUAIFENESIN ER 600 MG PO TB12
600.0000 mg | ORAL_TABLET | Freq: Two times a day (BID) | ORAL | Status: DC
Start: 2021-12-02 — End: 2021-12-03
  Filled 2021-12-02 (×2): qty 1

## 2021-12-02 MED ORDER — VANCOMYCIN HCL 1750 MG/350ML IV SOLN: 1750.0000 mg | Freq: Once | INTRAVENOUS | Status: DC

## 2021-12-02 MED ORDER — METFORMIN HCL ER 500 MG PO TB24
500.0000 mg | ORAL_TABLET | Freq: Every day | ORAL | Status: DC
  Administered 2021-12-03: 500 mg via ORAL
  Filled 2021-12-02 (×2): qty 1

## 2021-12-02 MED ORDER — PANTOPRAZOLE SODIUM 40 MG PO TBEC
40.0000 mg | DELAYED_RELEASE_TABLET | Freq: Every day | ORAL | Status: DC
  Administered 2021-12-03: 40 mg via ORAL
  Filled 2021-12-02: qty 1

## 2021-12-02 MED ORDER — ONDANSETRON HCL 4 MG PO TABS: 4.0000 mg | ORAL_TABLET | Freq: Four times a day (QID) | ORAL | Status: DC | PRN

## 2021-12-02 MED ORDER — FLUTICASONE PROPIONATE 50 MCG/ACT NA SUSP
1.0000 | Freq: Every day | NASAL | Status: DC
  Administered 2021-12-03: 1 via NASAL
  Filled 2021-12-02: qty 16

## 2021-12-02 MED ORDER — ACETAMINOPHEN 500 MG PO TABS
1000.0000 mg | ORAL_TABLET | Freq: Once | ORAL | Status: AC
  Administered 2021-12-02: 1000 mg via ORAL
  Filled 2021-12-02: qty 2

## 2021-12-02 MED ORDER — FOLIC ACID 1 MG PO TABS
1.0000 mg | ORAL_TABLET | Freq: Every day | ORAL | Status: DC
  Administered 2021-12-03: 1 mg via ORAL
  Filled 2021-12-02: qty 1

## 2021-12-02 MED ORDER — APIXABAN 5 MG PO TABS
5.0000 mg | ORAL_TABLET | Freq: Two times a day (BID) | ORAL | Status: DC
  Administered 2021-12-02 – 2021-12-03 (×2): 5 mg via ORAL
  Filled 2021-12-02 (×2): qty 1

## 2021-12-02 MED ORDER — K PHOS MONO-SOD PHOS DI & MONO 155-852-130 MG PO TABS
500.0000 mg | ORAL_TABLET | Freq: Three times a day (TID) | ORAL | Status: AC
Start: 2021-12-02 — End: 2021-12-03
  Administered 2021-12-02 – 2021-12-03 (×3): 500 mg via ORAL
  Filled 2021-12-02 (×3): qty 2

## 2021-12-02 MED ORDER — SODIUM CHLORIDE 0.9 % IV SOLN
2.0000 g | Freq: Three times a day (TID) | INTRAVENOUS | Status: DC
  Administered 2021-12-02 – 2021-12-03 (×3): 2 g via INTRAVENOUS
  Filled 2021-12-02 (×4): qty 2

## 2021-12-02 MED ORDER — ACETAMINOPHEN 650 MG RE SUPP: 650.0000 mg | Freq: Four times a day (QID) | RECTAL | Status: DC | PRN

## 2021-12-02 MED ORDER — ATORVASTATIN CALCIUM 40 MG PO TABS
80.0000 mg | ORAL_TABLET | Freq: Every evening | ORAL | Status: DC
Start: 2021-12-02 — End: 2021-12-03
  Administered 2021-12-02: 80 mg via ORAL
  Filled 2021-12-02: qty 2

## 2021-12-02 MED ORDER — IPRATROPIUM-ALBUTEROL 0.5-2.5 (3) MG/3ML IN SOLN
3.0000 mL | Freq: Once | RESPIRATORY_TRACT | Status: AC
  Administered 2021-12-02: 3 mL via RESPIRATORY_TRACT
  Filled 2021-12-02: qty 3

## 2021-12-02 MED ORDER — ASPIRIN EC 81 MG PO TBEC
81.0000 mg | DELAYED_RELEASE_TABLET | Freq: Every day | ORAL | Status: DC
  Administered 2021-12-02 – 2021-12-03 (×2): 81 mg via ORAL
  Filled 2021-12-02 (×2): qty 1

## 2021-12-02 MED ORDER — MOMETASONE FURO-FORMOTEROL FUM 100-5 MCG/ACT IN AERO
2.0000 | INHALATION_SPRAY | Freq: Two times a day (BID) | RESPIRATORY_TRACT | Status: DC
  Administered 2021-12-02 – 2021-12-03 (×2): 2 via RESPIRATORY_TRACT
  Filled 2021-12-02: qty 8.8

## 2021-12-02 MED ORDER — VANCOMYCIN HCL IN DEXTROSE 1-5 GM/200ML-% IV SOLN
1000.0000 mg | INTRAVENOUS | Status: DC
  Administered 2021-12-03: 1000 mg via INTRAVENOUS
  Filled 2021-12-02: qty 200

## 2021-12-02 MED ORDER — METOPROLOL TARTRATE 25 MG PO TABS
25.0000 mg | ORAL_TABLET | Freq: Two times a day (BID) | ORAL | Status: DC
Start: 2021-12-02 — End: 2021-12-03
  Administered 2021-12-02 – 2021-12-03 (×2): 25 mg via ORAL
  Filled 2021-12-02 (×2): qty 1

## 2021-12-02 MED ORDER — LOSARTAN POTASSIUM 50 MG PO TABS
100.0000 mg | ORAL_TABLET | Freq: Every day | ORAL | Status: DC
  Administered 2021-12-02 – 2021-12-03 (×2): 100 mg via ORAL
  Filled 2021-12-02 (×2): qty 2

## 2021-12-02 MED ORDER — ACETAMINOPHEN 325 MG PO TABS
650.0000 mg | ORAL_TABLET | Freq: Four times a day (QID) | ORAL | Status: DC | PRN
  Administered 2021-12-02: 650 mg via ORAL
  Filled 2021-12-02: qty 2

## 2021-12-02 MED ORDER — GABAPENTIN 300 MG PO CAPS
300.0000 mg | ORAL_CAPSULE | Freq: Three times a day (TID) | ORAL | Status: DC
Start: 2021-12-02 — End: 2021-12-03
  Administered 2021-12-02 – 2021-12-03 (×3): 300 mg via ORAL
  Filled 2021-12-02 (×3): qty 1

## 2021-12-02 MED ORDER — LINAGLIPTIN 5 MG PO TABS
5.0000 mg | ORAL_TABLET | Freq: Every day | ORAL | Status: DC
  Administered 2021-12-02 – 2021-12-03 (×2): 5 mg via ORAL
  Filled 2021-12-02 (×2): qty 1

## 2021-12-02 MED ORDER — LACTATED RINGERS IV SOLN: INTRAVENOUS | Status: DC

## 2021-12-02 MED ORDER — LEVOTHYROXINE SODIUM 75 MCG PO TABS
75.0000 ug | ORAL_TABLET | Freq: Every day | ORAL | Status: DC
  Administered 2021-12-03: 75 ug via ORAL
  Filled 2021-12-02: qty 1

## 2021-12-02 MED ORDER — LACTATED RINGERS IV BOLUS (SEPSIS)
1000.0000 mL | Freq: Once | INTRAVENOUS | Status: AC
  Administered 2021-12-02: 1000 mL via INTRAVENOUS

## 2021-12-02 MED ORDER — MAGNESIUM SULFATE 2 GM/50ML IV SOLN
2.0000 g | Freq: Once | INTRAVENOUS | Status: AC
  Administered 2021-12-02: 2 g via INTRAVENOUS
  Filled 2021-12-02: qty 50

## 2021-12-02 MED ORDER — OMEGA-3-ACID ETHYL ESTERS 1 G PO CAPS
1.0000 g | ORAL_CAPSULE | Freq: Two times a day (BID) | ORAL | Status: DC
  Administered 2021-12-02 – 2021-12-03 (×2): 1 g via ORAL
  Filled 2021-12-02 (×2): qty 1

## 2021-12-02 MED ORDER — INSULIN ASPART 100 UNIT/ML IJ SOLN
0.0000 [IU] | Freq: Three times a day (TID) | INTRAMUSCULAR | Status: DC
  Administered 2021-12-02: 15 [IU] via SUBCUTANEOUS
  Administered 2021-12-02: 20 [IU] via SUBCUTANEOUS
  Administered 2021-12-03: 15 [IU] via SUBCUTANEOUS
  Administered 2021-12-03: 11 [IU] via SUBCUTANEOUS
  Filled 2021-12-02: qty 0.2

## 2021-12-02 MED ORDER — VANCOMYCIN HCL 1750 MG/350ML IV SOLN
1750.0000 mg | Freq: Once | INTRAVENOUS | Status: AC
Start: 2021-12-02 — End: 2021-12-02
  Administered 2021-12-02: 1750 mg via INTRAVENOUS
  Filled 2021-12-02: qty 350

## 2021-12-02 MED ORDER — MENTHOL (TOPICAL ANALGESIC) 5 % EX PTCH: 1.0000 | MEDICATED_PATCH | Freq: Every day | CUTANEOUS | Status: DC

## 2021-12-02 MED ORDER — SODIUM CHLORIDE 0.9 % IV SOLN
2.0000 g | Freq: Once | INTRAVENOUS | Status: AC
  Administered 2021-12-02: 2 g via INTRAVENOUS
  Filled 2021-12-02: qty 2

## 2021-12-02 MED ORDER — ALBUTEROL SULFATE (2.5 MG/3ML) 0.083% IN NEBU
2.5000 mg | INHALATION_SOLUTION | RESPIRATORY_TRACT | Status: DC | PRN
  Administered 2021-12-02 – 2021-12-03 (×2): 2.5 mg via RESPIRATORY_TRACT
  Filled 2021-12-02 (×2): qty 3

## 2021-12-02 MED ORDER — IPRATROPIUM-ALBUTEROL 0.5-2.5 (3) MG/3ML IN SOLN
3.0000 mL | Freq: Three times a day (TID) | RESPIRATORY_TRACT | Status: DC
  Administered 2021-12-02 – 2021-12-03 (×3): 3 mL via RESPIRATORY_TRACT
  Filled 2021-12-02 (×3): qty 3

## 2021-12-02 MED ORDER — METHYLPREDNISOLONE SODIUM SUCC 125 MG IJ SOLR
125.0000 mg | Freq: Once | INTRAMUSCULAR | Status: AC
  Administered 2021-12-02: 125 mg via INTRAVENOUS
  Filled 2021-12-02: qty 2

## 2021-12-02 MED ORDER — CYANOCOBALAMIN 500 MCG PO TABS
500.0000 ug | ORAL_TABLET | Freq: Every day | ORAL | Status: DC
  Administered 2021-12-03: 500 ug via ORAL
  Filled 2021-12-02: qty 1

## 2021-12-02 MED ORDER — CYCLOBENZAPRINE HCL 10 MG PO TABS: 10.0000 mg | ORAL_TABLET | Freq: Three times a day (TID) | ORAL | Status: DC | PRN

## 2021-12-02 MED ORDER — INSULIN GLARGINE-YFGN 100 UNIT/ML ~~LOC~~ SOLN
50.0000 [IU] | Freq: Every day | SUBCUTANEOUS | Status: DC
  Administered 2021-12-02: 50 [IU] via SUBCUTANEOUS
  Filled 2021-12-02 (×2): qty 0.5

## 2021-12-02 MED ORDER — ASCORBIC ACID 500 MG PO TABS
500.0000 mg | ORAL_TABLET | Freq: Every day | ORAL | Status: DC
  Administered 2021-12-03: 500 mg via ORAL
  Filled 2021-12-02: qty 1

## 2021-12-02 MED ORDER — ONDANSETRON HCL 4 MG/2ML IJ SOLN: 4.0000 mg | Freq: Four times a day (QID) | INTRAMUSCULAR | Status: DC | PRN

## 2021-12-02 MED ORDER — LIDOCAINE 5 % EX PTCH
1.0000 | MEDICATED_PATCH | CUTANEOUS | Status: DC
  Administered 2021-12-02: 1 via TRANSDERMAL
  Filled 2021-12-02 (×2): qty 1

## 2021-12-02 NOTE — ED Triage Notes (Signed)
Pt. BIB [facility] from [facility] facility c/o flu like symptoms including fever, chills, sore throat, low back pain, and cough x2 days. Pt. Has a hx of stroke 2 years ago with left sided deficits. Pt. Also states that she was recently treated for a UTI. Pt. Was tachycardic at 160. Given 500cc NS PTA.  EMS VS:   BP: 128/100 HR: 148 O2: 95% 3L/M via [place] RR: 30 ETCO2: 38

## 2021-12-02 NOTE — Progress Notes (Signed)
Pharmacy Antibiotic Note  Karla Hoover is a 51 y.o. female admitted on 12/02/2021 with pneumonia.  Pharmacy has been consulted for vanc/cefepime dosing.  Plan: Cefepime 2g IV q8 Vancomycin 1750mg  IV x 1 then 1g IV q24 - goal AUC 400-550 Discontinue vanc if MRSA PCR negative     Temp (24hrs), Avg:99.7 F (37.6 C), Min:98.4 F (36.9 C), Max:100.9 F (38.3 C)  Recent Labs  Lab 12/02/21 0648 12/02/21 0845  WBC 14.1*  --   CREATININE 1.36*  --   LATICACIDVEN 2.3* 1.6    CrCl cannot be calculated (Unknown ideal weight.).    Allergies  Allergen Reactions   Guaifenesin Anaphylaxis   Kiwi Extract Anaphylaxis and Rash   Levaquin [Levofloxacin In D5w] Shortness Of Breath and Rash   Strawberry Extract Anaphylaxis and Rash   Baclofen Other (See Comments)    Near syncope/ fall     Thank you for allowing pharmacy to be a part of this patients care.  Karla Hoover 12/02/2021 9:52 AM

## 2021-12-02 NOTE — Progress Notes (Signed)
Called into patients room. Patient requesting a box of tissues because of a "coughing spell" per patient. Provided patient with requested tissues. Patient also requested to use her inhaler, explained to patient that the provider has ordered her inhaler at scheduled times and that she received her inhaler just before 2000. Patient verbalizes understanding and stated "yall talk to me like I'm a fucking idiot" I explained to patient that I was only explaining to her that the provider has ordered her inhaler at specific times and its not available as needed, attempted to explain that she has a nebulizer PRN for wheezing. Patient interrupted stating "im starting to get fucking mad here" attempted to verbally redirect patient but was unsuccessful as patient continues to curse at staff. Will call RT for neb treatment.

## 2021-12-02 NOTE — Progress Notes (Signed)
A consult was received from an ED physician for Vancomycin per pharmacy dosing.  The patient's profile has been reviewed for ht/wt/allergies/indication/available labs.    A one time order has been placed for Vancomycin 1750mg , and Cefepime 2gm x1 per MD.  Further antibiotics/pharmacy consults should be ordered by admitting physician if indicated.                       Thank you,  PharmD 12/02/2021  7:14 AM

## 2021-12-02 NOTE — Assessment & Plan Note (Signed)
Continue levothyroxine 75 mcg po daily

## 2021-12-02 NOTE — H&P (Signed)
History and Physical    Patient: Karla Hoover [id number] DOB: 03-02-1971 DOA: 12/02/2021 DOS: the patient was seen and examined on 12/02/2021 PCP: Patient, [name]  Patient coming from: SNF  Chief Complaint:  Chief Complaint  Patient presents with   Flu-Like symptoms   Tachycardia    HPI: Karla Hoover is a 51 y.o. female with medical history significant of asthma, stage 3a CKD, COPD with chronic bronchitis and chronic respiratory failure with hypoxia, type II DM, hypertension, GERD, grade 1 diastolic dysfunction, hypothyroidism, hyperlipidemia, pulmonary embolism, DVT, history of other non-hemorrhagic stroke with residual left-sided hemiparesis who is brought from her residential facility due to progressively worse yellowish sputum productive cough, fever, sore throat, rhinorrhea, fatigue, malaise, wheezing and dyspnea for the past week.  Not sure of sick contacts.  No travel history other than going locally to her sister's wedding.  Positive abdominal pain with cough.  She has been nauseous, but no vomiting, no diarrhea, constipation, melena or hematochezia.  No flank pain, dysuria or frequency.  However, she has been urinating less and her urine looks more amber-colored than usual.  She last urinated at the facility before coming to the hospital.  No chest pain, palpitations, PND, orthopnea or recent lower extremity edema.  No polyuria, polydipsia, polyphagia or blurry vision.  ED course: Initial vital signs were temperature 100.9 F, pulse 142, respirations 22, BP 141/70 mmHg O2 sat 99% on 2 LPM via nasal cannula.  The patient received 3000 mL of LR bolus, acetaminophen 1000 mg p.o. x1, DuoNeb, methylprednisolone 125 mg IVP x1, 2 g of cefepime IVPB and vancomycin per pharmacy.  She is now afebrile and her heart rate has decreased to the 100s and 110.  Her most recent respiratory rate is normal.  Lab work: CBC is her white count of 14.1 with 84% neutrophils, hemoglobin  12.3 g/dL platelets 243.  PT 16.3, INR 1.3 and PTT 33.  Lactic acid 2.3 and then 1.6 mmol/L.  CMP showed a glucose of 241 and creatinine of 1.36 mg/dL.  Albumin was 3.3 g/dL.  The rest of the CMP measurements are normal when calcium and sodium are corrected.  Urine analysis has still not collected.  Imaging: Portable 1 view chest radiograph showed normal heart size and vascular pattern.  There was aortic atherosclerosis with stable mediastinum.  Mild COPD with clear lungs.  Review of Systems: As mentioned in the history of present illness. All other systems reviewed and are negative. Past Medical History:  Diagnosis Date   Asthma    Chronic kidney disease, stage 3a ([place]) 02/09/2021   Chronic respiratory failure with hypoxia ([place]) 02/09/2021   COPD (chronic obstructive pulmonary disease) (HCC)    COPD with chronic bronchitis ([place]) 05/04/2009   Former smoker 36 pack year smoking history     Diabetes mellitus without complication ([place])    Essential hypertension 05/04/2009   Qualifier: Diagnosis of  By: Quentin Cornwall CMA, Jessica     GERD without esophagitis 02/09/2021   Grade I diastolic dysfunction [id number]   Hypertension    Hypothyroidism 02/09/2021   Mixed 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 ([place]) 02/09/2021   Pulmonary embolism ([place]) 11/16/2019   Stroke ([place])    Type 2 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 disease, with long-term current use of insulin ([place]) 02/09/2021   History reviewed. No pertinent surgical history. Social History:  reports that she quit smoking about 2 years ago. Her smoking use included cigarettes. She started smoking  about 38 years ago. She has a 63.00 pack-year smoking history. She has never used smokeless tobacco. She reports that she does not drink alcohol and does not use drugs.  Allergies  Allergen Reactions   Guaifenesin Anaphylaxis   Kiwi Extract Anaphylaxis and Rash   Levaquin [Levofloxacin In D5w] Shortness Of Breath and Rash    Strawberry Extract Anaphylaxis and Rash   Baclofen Other (See Comments)    Near syncope/ fall    Family History  Problem Relation Age of Onset   Diabetes Mother    COPD Sister    Heart failure Sister     Prior to Admission medications   Medication Sig Start Date End Date Taking? Authorizing Provider  acetaminophen (TYLENOL) 500 MG tablet Take 1,000 mg by mouth in the morning, at noon, and at bedtime.    [provider]  albuterol (VENTOLIN HFA) 108 (90 Base) MCG/ACT inhaler Inhale 2 puffs into the lungs every 6 (six) hours as needed for wheezing or shortness of breath. Patient taking differently: Inhale 2 puffs into the lungs every 6 (six) hours as needed for wheezing or shortness of breath ("COPD"). 10/04/19   Kerin Perna, NP  apixaban (ELIQUIS) 5 MG TABS tablet Take 2 tablets (10mg ) twice daily for 7 days, then 1 tablet (5mg ) twice daily Patient taking differently: Take 5 mg by mouth every 12 (twelve) hours. 11/21/19   Kayleen Memos, DO  aspirin 81 MG chewable tablet Chew 81 mg by mouth in the morning.    [provider]  atorvastatin (LIPITOR) 80 MG tablet Take 1 tablet (80 mg total) by mouth daily. Patient taking differently: Take 80 mg by mouth every evening. 10/04/19   Kerin Perna, NP  chlorhexidine (PERIDEX) 0.12 % solution Use as directed 15 mLs in the mouth or throat daily.    [provider]  cyclobenzaprine (FLEXERIL) 10 MG tablet Take 1 tablet (10 mg total) by mouth 3 (three) times daily as needed for muscle spasms. Patient taking differently: Take 10 mg by mouth See admin instructions. 10mg  at bedtime each night, may take an additional 10mg  three times a day as needed for muscle spasms 10/04/19   Kerin Perna, NP  folic acid (FOLVITE) Q000111Q MCG tablet Take 800 mcg by mouth daily.    [provider]  gabapentin (NEURONTIN) 300 MG capsule Take 1 capsule (300 mg total) by mouth 3 (three) times daily. Patient taking  differently: Take 600 mg by mouth at bedtime. 11/11/19 07/14/21  Kerin Perna, NP  HYDROcodone-acetaminophen (NORCO/VICODIN) 5-325 MG tablet Take 2 tablets by mouth every 6 (six) hours as needed. Patient not taking: Reported on 05/29/2021 04/01/21   Charlesetta Shanks, MD  hydrOXYzine (ATARAX/VISTARIL) 10 MG tablet Take 10 mg by mouth 2 (two) times daily as needed for anxiety.    [provider]  insulin glargine (LANTUS) 100 UNIT/ML injection Inject 35 Units into the skin daily.    [provider]  insulin lispro (HUMALOG) 100 UNIT/ML injection Inject 2-14 Units into the skin 3 (three) times daily with meals. Per sliding scale: CBG 201-250 = 2 Units, 251-300 = 4 units,301-350 = 6 units, 351-400 = 8 units, 401-450 = 10 units, 451-500 = 12 units, 501 = 14 units, recheck in 2 hours.    [provider]  levothyroxine (SYNTHROID) 75 MCG tablet Take 75 mcg by mouth daily before breakfast.    [provider]  lidocaine (LIDODERM) 5 % Place 1 patch onto the skin daily.  Remove & Discard patch within 12 hours or as directed by MD 04/01/21   Arby BarrettePfeiffer, Marcy, MD  linagliptin (TRADJENTA) 5 MG TABS tablet Take 5 mg by mouth daily.    [provider]  losartan (COZAAR) 50 MG tablet Take 75 mg by mouth daily.    [provider]  metoprolol tartrate (LOPRESSOR) 25 MG tablet Take 1 tablet (25 mg total) by mouth 2 (two) times daily. 10/04/19   Grayce SessionsEdwards, Michelle P, NP  naproxen (NAPROSYN) 250 MG tablet Take 250 mg by mouth at bedtime.    [provider]  Omega-3 Fatty Acids (FISH OIL) 1000 MG CAPS Take 1,000 mg by mouth in the morning and at bedtime.    [provider]  omeprazole (PRILOSEC) 20 MG capsule Take 20 mg by mouth daily.    [provider]  SYMBICORT 80-4.5 MCG/ACT inhaler Inhale 2 puffs into the lungs 2 (two) times daily.    [provider]  vitamin B-12 (CYANOCOBALAMIN) 500 MCG tablet Take 500 mcg by mouth daily.     [provider]    Physical Exam: Vitals:   12/02/21 0730 12/02/21 0800 12/02/21 0858 12/02/21 0900  BP: (!) 132/92 111/74 111/66 113/89  Pulse: (!) 129 (!) 125 (!) 116 (!) 115  Resp: 20 (!) 21 (!) 25 15  Temp:   98.4 F (36.9 C)   TempSrc:      SpO2: 98% 99% 96% 98%   Physical Exam Constitutional:      General: She is awake.     Appearance: She is obese.  HENT:     Head: Normocephalic.     Mouth/Throat:     Mouth: Mucous membranes are dry.  Eyes:     Pupils: Pupils are equal, round, and reactive to light.  Neck:     Vascular: No JVD.  Cardiovascular:     Rate and Rhythm: Regular rhythm. Tachycardia present.     Heart sounds: S1 normal and S2 normal. No murmur heard. Pulmonary:     Effort: Pulmonary effort is normal. No respiratory distress.     Breath sounds: Wheezing and rhonchi present.     Comments: Normal effort on supplemental oxygen. Abdominal:     General: Bowel sounds are normal. There is no distension.     Palpations: Abdomen is soft.     Tenderness: There is abdominal tenderness.     Comments: Mild diffuse tenderness, particularly of upper quadrants, secondary to cough.  Musculoskeletal:     Cervical back: Neck supple.     Right lower leg: No edema.     Left lower leg: No edema.  Lymphadenopathy:     Cervical: No cervical adenopathy.  Skin:    General: Skin is warm and dry.     Coloration: Skin is not jaundiced.  Neurological:     Mental Status: She is alert and oriented to person, place, and time.     Cranial Nerves: Cranial nerve deficit present.     Motor: Weakness present.     Comments: Chronic 3/5 left-sided hemiparesis.  Coordination is impaired on left side.  Unable to test gait.  Psychiatric:        Mood and Affect: Mood normal.        Behavior: Behavior is cooperative.   Data Reviewed:  There are no new results to review at this time.  Assessment and Plan: Principal Problem:   Sepsis due to undetermined organism POA (HCC) In  the setting of   COPD with acute  exacerbation ([place]) No obvious pneumonia on imaging. Her UA still pending, but symptoms mostly respiratory. Admit to telemetry/inpatient. Continue supplemental oxygen. Scheduled and as needed bronchodilators. Continue LR at 100 mL/h. Continue cefepime per pharmacy. Continue vancomycin per pharmacy. Check sputum Gram stain, culture and sensitivity. Check strep pneumoniae urinary antigen. Follow-up blood culture and sensitivity. Follow-up CBC and Chemistry in the morning.  Active Problems:   Grade I diastolic dysfunction No signs of decompensation. Continue metoprolol 25 mg p.o. twice daily. Hold losartan 50 mg p.o. daily. Follow-up BP, HR, renal function electrolytes.    Essential hypertension Continue beta-blocker and hold losartan.    Bilateral pulmonary embolism (HCC) Continue apixaban 5 mg p.o. twice daily.    History of stroke Supportive care. Continue atorvastatin, aspirin and apixaban.    Type 2 diabetes mellitus with stage 3a CKD,     with long-term current use of insulin (HCC) Carbohydrate modified diet. Continue Lantus 35 units daily. CBG monitoring with RI SS. Check hemoglobin A1c. Monitor renal function.    Mixed diabetic hyperlipidemia associated    with type 2 diabetes mellitus (HCC) Continue atorvastatin 80 mg p.o. daily.    GERD without esophagitis Continue PPI.    Hypothyroidism Continue levothyroxine 75 mcg p.o. daily. Check TSH when not acutely ill.    Mild protein-calorie malnutrition (HCC) Protein supplementation. Consider nutritional services evaluation.    Advance Care Planning:   Code Status: Full Code   Consults:   Family Communication:   Severity of Illness: The appropriate patient status for this patient is INPATIENT. Inpatient status is judged to be reasonable and necessary in order to provide the required intensity of service to ensure the patient's safety. The patient's presenting symptoms,  physical exam findings, and initial radiographic and laboratory data in the context of their chronic comorbidities is felt to place them at high risk for further clinical deterioration. Furthermore, it is not anticipated that the patient will be medically stable for discharge from the hospital within 2 midnights of admission.   * I certify that at the point of admission it is my clinical judgment that the patient will require inpatient hospital care spanning beyond 2 midnights from the point of admission due to high intensity of service, high risk for further deterioration and high frequency of surveillance required.*  Author: Reubin Milan, MD 12/02/2021 9:43 AM  For on call review [email or web address].   This document was prepared using Dragon voice recognition software and may contain some unintended transcription errors.

## 2021-12-02 NOTE — ED Provider Notes (Signed)
[facility] DEPT Provider Note   CSN: [id number] Arrival date & time: 12/02/21  0608     History  Chief Complaint  Patient presents with   Flu-Like symptoms   Tachycardia    Karla Hoover is a 51 y.o. female.  The history is provided by the patient and medical records. No language interpreter was used.   51 year old obese female with significant history of diabetes, COPD, PE on Eliquis, stroke with left-sided deficit, hypertension brought here via EMS from a nursing facility for flulike symptoms.  Patient reports that a staff at nursing facility was sick with a cold but came to work and since then multiple residents became sick with the same symptoms including her.  Patient states for the past week she has had fever, chills, body aches, headache, congestion, cough, increased shortness of breath.  Symptom has been persistent.  She does not endorse any nausea vomiting or diarrhea or dysuria.  Patient is is at the facility due to having prior stroke with residual left-sided deficit.  Home Medications Prior to Admission medications   Medication Sig Start Date End Date Taking? Authorizing Provider  acetaminophen (TYLENOL) 500 MG tablet Take 1,000 mg by mouth in the morning, at noon, and at bedtime.    [provider]  albuterol (VENTOLIN HFA) 108 (90 Base) MCG/ACT inhaler Inhale 2 puffs into the lungs every 6 (six) hours as needed for wheezing or shortness of breath. Patient taking differently: Inhale 2 puffs into the lungs every 6 (six) hours as needed for wheezing or shortness of breath ("COPD"). 10/04/19   Kerin Perna, NP  apixaban (ELIQUIS) 5 MG TABS tablet Take 2 tablets (10mg ) twice daily for 7 days, then 1 tablet (5mg ) twice daily Patient taking differently: Take 5 mg by mouth every 12 (twelve) hours. 11/21/19   Kayleen Memos, DO  aspirin 81 MG chewable tablet Chew 81 mg by mouth in the morning.    [provider]   atorvastatin (LIPITOR) 80 MG tablet Take 1 tablet (80 mg total) by mouth daily. Patient taking differently: Take 80 mg by mouth every evening. 10/04/19   Kerin Perna, NP  chlorhexidine (PERIDEX) 0.12 % solution Use as directed 15 mLs in the mouth or throat daily.    [provider]  cyclobenzaprine (FLEXERIL) 10 MG tablet Take 1 tablet (10 mg total) by mouth 3 (three) times daily as needed for muscle spasms. Patient taking differently: Take 10 mg by mouth See admin instructions. 10mg  at bedtime each night, may take an additional 10mg  three times a day as needed for muscle spasms 10/04/19   Kerin Perna, NP  folic acid (FOLVITE) [id number] MCG tablet Take 800 mcg by mouth daily.    [provider]  gabapentin (NEURONTIN) 300 MG capsule Take 1 capsule (300 mg total) by mouth 3 (three) times daily. Patient taking differently: Take 600 mg by mouth at bedtime. 11/11/19 07/14/21  Kerin Perna, NP  HYDROcodone-acetaminophen (NORCO/VICODIN) 5-325 MG tablet Take 2 tablets by mouth every 6 (six) hours as needed. Patient not taking: Reported on 05/29/2021 04/01/21   Charlesetta Shanks, MD  hydrOXYzine (ATARAX/VISTARIL) 10 MG tablet Take 10 mg by mouth 2 (two) times daily as needed for anxiety.    [provider]  insulin glargine (LANTUS) 100 UNIT/ML injection Inject 35 Units into the skin daily.    [provider]  insulin lispro (HUMALOG) 100 UNIT/ML injection Inject 2-14 Units into the skin 3 (three) times daily  with meals. Per sliding scale: CBG 201-250 = 2 Units, 251-300 = 4 units,301-350 = 6 units, 351-400 = 8 units, 401-450 = 10 units, 451-500 = 12 units, 501 = 14 units, recheck in 2 hours.    [provider]  levothyroxine (SYNTHROID) 75 MCG tablet Take 75 mcg by mouth daily before breakfast.    [provider]  lidocaine (LIDODERM) 5 % Place 1 patch onto the skin daily. Remove & Discard patch within 12 hours or as directed by MD 04/01/21    Arby Barrette, MD  linagliptin (TRADJENTA) 5 MG TABS tablet Take 5 mg by mouth daily.    [provider]  losartan (COZAAR) 50 MG tablet Take 75 mg by mouth daily.    [provider]  metoprolol tartrate (LOPRESSOR) 25 MG tablet Take 1 tablet (25 mg total) by mouth 2 (two) times daily. 10/04/19   Grayce Sessions, NP  naproxen (NAPROSYN) 250 MG tablet Take 250 mg by mouth at bedtime.    [provider]  Omega-3 Fatty Acids (FISH OIL) 1000 MG CAPS Take 1,000 mg by mouth in the morning and at bedtime.    [provider]  omeprazole (PRILOSEC) 20 MG capsule Take 20 mg by mouth daily.    [provider]  SYMBICORT 80-4.5 MCG/ACT inhaler Inhale 2 puffs into the lungs 2 (two) times daily.    [provider]  vitamin B-12 (CYANOCOBALAMIN) 500 MCG tablet Take 500 mcg by mouth daily.    [provider]      Allergies    Guaifenesin, Kiwi extract, Levaquin [levofloxacin in d5w], Strawberry extract, and Baclofen    Review of Systems   Review of Systems  All other systems reviewed and are negative.  Physical Exam Updated Vital Signs BP (!) 141/70 (BP Location: Right Arm)    Pulse (!) 142    Temp (!) 100.9 F (38.3 C) (Oral)    Resp (!) 22    LMP 10/20/2019    SpO2 99%  Physical Exam Vitals and nursing note reviewed.  Constitutional:      General: She is not in acute distress.    Appearance: She is well-developed. She is obese.  HENT:     Head: Atraumatic.  Eyes:     Conjunctiva/sclera: Conjunctivae normal.  Cardiovascular:     Rate and Rhythm: Tachycardia present.  Pulmonary:     Effort: Pulmonary effort is normal.     Breath sounds: Rhonchi and rales present. No wheezing.  Abdominal:     Palpations: Abdomen is soft.     Tenderness: There is no abdominal tenderness.  Musculoskeletal:     Cervical back: Neck supple.     Right lower leg: No edema.     Left lower leg: No edema.  Skin:    Findings: No rash.   Neurological:     Mental Status: She is alert. Mental status is at baseline.  Psychiatric:        Mood and Affect: Mood normal.    ED Results / Procedures / Treatments   Labs (all labs ordered are listed, but only abnormal results are displayed) Labs Reviewed  LACTIC ACID, PLASMA - Abnormal; Notable for the following components:      Result Value   Lactic Acid, Venous 2.3 (*)    All other components within normal limits  COMPREHENSIVE METABOLIC PANEL - Abnormal; Notable for the following components:   Sodium 134 (*)    Glucose, Bld 241 (*)    Creatinine,  Ser 1.36 (*)    Calcium 8.7 (*)    Albumin 3.3 (*)    GFR, Estimated 48 (*)    All other components within normal limits  CBC WITH DIFFERENTIAL/PLATELET - Abnormal; Notable for the following components:   WBC 14.1 (*)    Neutro Abs 11.9 (*)    All other components within normal limits  PROTIME-INR - Abnormal; Notable for the following components:   Prothrombin Time 16.3 (*)    INR 1.3 (*)    All other components within normal limits  RESP PANEL BY RT-PCR (FLU A&B, COVID) ARPGX2  CULTURE, BLOOD (ROUTINE X 2)  CULTURE, BLOOD (ROUTINE X 2)  URINE CULTURE  APTT  LACTIC ACID, PLASMA  URINALYSIS, ROUTINE W REFLEX MICROSCOPIC  I-STAT BETA HCG BLOOD, ED ([facility], [facility], [facility] ONLY)    EKG EKG Interpretation  Date/Time:  Sunday December 02 2021 06:24:06 EST Ventricular Rate:  146 PR Interval:  111 QRS Duration: 93 QT Interval:  274 QTC Calculation: 427 R Axis:   55 Text Interpretation: Sinus tachycardia Low voltage, precordial leads Since last tracing rate faster Otherwise no significant change Confirmed by Deno Etienne [phone]) on 12/02/2021 6:56:52 AM  Radiology DG Chest Port 1 View  Result Date: 12/02/2021 CLINICAL DATA:  Questionable sepsis. EXAM: PORTABLE CHEST 1 VIEW COMPARISON:  Portable chest 11/09/2021. FINDINGS: Heart size and vascular pattern are normal. There is aortic atherosclerosis with stable mediastinum. The lungs  clear mild COPD change. No pleural effusion is seen. Thoracic spondylosis. IMPRESSION: No evidence of acute chest disease or interval changes. Electronically Signed   By: Telford Nab M.D.   On: 12/02/2021 07:21    Procedures .Critical Care Performed by: Domenic Moras, PA-C Authorized by: Domenic Moras, PA-C   Critical care provider statement:    Critical care time (minutes):  35   Critical care was time spent personally by me on the following activities:  Development of treatment plan with patient or surrogate, discussions with consultants, evaluation of patient's response to treatment, examination of patient, ordering and review of laboratory studies, ordering and review of radiographic studies, ordering and performing treatments and interventions, pulse oximetry, re-evaluation of patient's condition and review of old charts    Medications Ordered in ED Medications  vancomycin (VANCOREADY) IVPB 1750 mg/350 mL (1,750 mg Intravenous New Bag/Given 12/02/21 0853)  lactated ringers bolus 1,000 mL (has no administration in time range)  lactated ringers infusion (has no administration in time range)  acetaminophen (TYLENOL) tablet 650 mg (has no administration in time range)    Or  acetaminophen (TYLENOL) suppository 650 mg (has no administration in time range)  ondansetron (ZOFRAN) tablet 4 mg (has no administration in time range)    Or  ondansetron (ZOFRAN) injection 4 mg (has no administration in time range)  albuterol (PROVENTIL) (2.5 MG/3ML) 0.083% nebulizer solution 2.5 mg (has no administration in time range)  ipratropium-albuterol (DUONEB) 0.5-2.5 (3) MG/3ML nebulizer solution 3 mL (has no administration in time range)  insulin aspart (novoLOG) injection 0-20 Units (has no administration in time range)  lactated ringers bolus 1,000 mL (1,000 mLs Intravenous New Bag/Given 12/02/21 0704)  acetaminophen (TYLENOL) tablet 1,000 mg (1,000 mg Oral Given 12/02/21 0732)  ceFEPIme (MAXIPIME) 2 g in  sodium chloride 0.9 % 100 mL IVPB (2 g Intravenous New Bag/Given 12/02/21 0736)  methylPREDNISolone sodium succinate (SOLU-MEDROL) 125 mg/2 mL injection 125 mg (125 mg Intravenous Given 12/02/21 0841)  ipratropium-albuterol (DUONEB) 0.5-2.5 (3) MG/3ML nebulizer solution 3 mL (3 mLs Nebulization Given 12/02/21  0841)    ED Course/ Medical Decision Making/ A&P                           Medical Decision Making Risk Prescription drug management. Decision regarding hospitalization.    BP (!) 141/70 (BP Location: Right Arm)    Pulse (!) 142    Temp (!) 100.9 F (38.3 C) (Oral)    Resp (!) 22    LMP 10/20/2019    SpO2 99%   7:08 AM This is a 51 year old female with multiple comorbidities including stroke with left-sided residual deficits along with history of diabetes, COPD, PE currently on Eliquis living at a nursing facility and is here with approximately a week long duration of flulike symptoms.  On presentation, patient is tachypneic and tachycardic with a heart rate of 142, she is febrile with a temperature of 100.9, her blood pressure was 141/70.  Lungs remarkable for scattered crackles in her lung bases. code sepsis was initiated, since her infectious is likely pulmonary source, and patient is currently living in a facility, I have ordered cefepime and vancomycin antibiotic and will also give IV fluid for hydration and fluid resuscitation.  7:56 AM Labs, EKG, and imaging independently reviewed interpreted by me.  Patient has an elevated lactic acid of 2.3, elevated white count of 14.1, and as mentioned earlier she has a fever of 100.9.  She will be resuscitated with IV fluid at 30 mill per kilogram.  I have initiated antibiotic which includes cefepime and vancomycin to cover for potential pulmonary source.  Chest x-ray however did not show any acute changes.  Patient has AKI with creatinine of 1.36 which should be improving with IV fluid.  She is hyperglycemic with a CBG of 241 but normal anion  gap and no evidence of DKA.  Her viral respiratory panel was negative for COVID and flu.  Her EKG shows sinus tachycardia without concerning arrhythmia.  Patient does have history of PE however is compliant with Eliquis.  With her presentation, I am less concerned for PE causing her symptoms.  9:12 AM Appreciate consultation from [facility] hospitalist, Dr. Olevia Bowens, who agrees to admit patient for further care.  Amando Hoyt SANDOVAL was evaluated in Emergency Department on 12/02/2021 for the symptoms described in the history of present illness. She was evaluated in the context of the global COVID-19 pandemic, which necessitated consideration that the patient might be at risk for infection with the SARS-CoV-2 virus that causes COVID-19. Institutional protocols and algorithms that pertain to the evaluation of patients at risk for COVID-19 are in a state of rapid change based on information released by regulatory bodies including the CDC and federal and state organizations. These policies and algorithms were followed during the patient's care in the ED.   This patient presents to the ED for concern of sepsis, this involves an extensive number of treatment options, and is a complaint that carries with it a high risk of complications and morbidity.  The differential diagnosis includes pneumonia,   Co morbidities that complicate the patient evaluation COPD  PE on Eliquis  Prior stroke Additional history obtained:  Additional history obtained from EMS notes External records from outside source obtained and reviewed including prior ER notes  Lab Tests:  I Ordered, and personally interpreted labs.  The pertinent results include:  as above  Imaging Studies ordered:  I ordered imaging studies including CXR I independently visualized and interpreted imaging which showed without acute finding  I agree with the radiologist interpretation  Cardiac Monitoring:  The patient was maintained on a cardiac monitor.   I personally viewed and interpreted the cardiac monitored which showed an underlying rhythm of: sinus tachycardia  Medicines ordered and prescription drug management:  I ordered medication including Cefepime/vanc  for PNA Reevaluation of the patient after these medicines showed that the patient improved I have reviewed the patients home medicines and have made adjustments as needed  Test Considered: chest CTA however low suspicion for PE or dissection  Critical Interventions: IV abx  IVF    Consultations Obtained:  I requested consultation with the Dr. Olevia Bowens,  and discussed lab and imaging findings as well as pertinent plan - they recommend: admission  Problem List / ED Course: sepsis/pneumonia  Reevaluation:  After the interventions noted above, I reevaluated the patient and found that they have :improved  Social Determinants of Health: live in nursing facility  COPD  Dispostion:  After consideration of the diagnostic results and the patients response to treatment, I feel that the patent would benefit from admission.         Final Clinical Impression(s) / ED Diagnoses Final diagnoses:  Sepsis, due to unspecified organism, unspecified whether acute organ dysfunction present [facility])  COPD exacerbation [facility])    Rx / DC Orders ED Discharge Orders     None         Domenic Moras, PA-C 12/02/21 0915    Lennice Sites, DO 12/02/21 [phone]

## 2021-12-02 NOTE — Plan of Care (Signed)

## 2021-12-02 NOTE — ED Notes (Signed)
Pt mother called for an update. Plan of care explained and updated. Pt mother states understanding.

## 2021-12-03 ENCOUNTER — Other Ambulatory Visit (HOSPITAL_COMMUNITY): Payer: Self-pay

## 2021-12-03 DIAGNOSIS — A419 Sepsis, unspecified organism: Secondary | ICD-10-CM | POA: Diagnosis not present

## 2021-12-03 LAB — CBC WITH DIFFERENTIAL/PLATELET
Abs Immature Granulocytes: 0.19 10*3/uL — ABNORMAL HIGH (ref 0.00–0.07)
Basophils Absolute: 0 10*3/uL (ref 0.0–0.1)
Basophils Relative: 0 %
Eosinophils Absolute: 0 10*3/uL (ref 0.0–0.5)
Eosinophils Relative: 0 %
HCT: 35.5 % — ABNORMAL LOW (ref 36.0–46.0)
Hemoglobin: 11.2 g/dL — ABNORMAL LOW (ref 12.0–15.0)
Immature Granulocytes: 1 %
Lymphocytes Relative: 11 %
Lymphs Abs: 2 10*3/uL (ref 0.7–4.0)
MCH: 27.5 pg (ref 26.0–34.0)
MCHC: 31.5 g/dL (ref 30.0–36.0)
MCV: 87 fL (ref 80.0–100.0)
Monocytes Absolute: 0.5 10*3/uL (ref 0.1–1.0)
Monocytes Relative: 3 %
Neutro Abs: 14.5 10*3/uL — ABNORMAL HIGH (ref 1.7–7.7)
Neutrophils Relative %: 85 %
Platelets: 268 10*3/uL (ref 150–400)
RBC: 4.08 MIL/uL (ref 3.87–5.11)
RDW: 13.2 % (ref 11.5–15.5)
WBC: 17.2 10*3/uL — ABNORMAL HIGH (ref 4.0–10.5)
nRBC: 0 % (ref 0.0–0.2)

## 2021-12-03 LAB — URINE CULTURE: Culture: <10000 — AB

## 2021-12-03 LAB — STREP PNEUMONIAE URINARY ANTIGEN: Strep Pneumo Urinary Antigen: NEGATIVE

## 2021-12-03 LAB — BASIC METABOLIC PANEL
Anion gap: 8 (ref 5–15)
BUN: 21 mg/dL — ABNORMAL HIGH (ref 6–20)
CO2: 22 mmol/L (ref 22–32)
Calcium: 8.6 mg/dL — ABNORMAL LOW (ref 8.9–10.3)
Chloride: 102 mmol/L (ref 98–111)
Creatinine, Ser: 1.14 mg/dL — ABNORMAL HIGH (ref 0.44–1.00)
GFR, Estimated: 59 mL/min — ABNORMAL LOW (ref >60)
Glucose, Bld: 331 mg/dL — ABNORMAL HIGH (ref 70–99)
Potassium: 4.2 mmol/L (ref 3.5–5.1)
Sodium: 132 mmol/L — ABNORMAL LOW (ref 135–145)

## 2021-12-03 LAB — GLUCOSE, CAPILLARY
Glucose-Capillary: 269 mg/dL — ABNORMAL HIGH (ref 70–99)
Glucose-Capillary: 343 mg/dL — ABNORMAL HIGH (ref 70–99)

## 2021-12-03 LAB — MRSA NEXT GEN BY PCR, NASAL: MRSA by PCR Next Gen: NOT DETECTED

## 2021-12-03 MED ORDER — IPRATROPIUM-ALBUTEROL 0.5-2.5 (3) MG/3ML IN SOLN
3.0000 mL | Freq: Three times a day (TID) | RESPIRATORY_TRACT | 0 refills | Status: DC
  Filled 2021-12-03: qty 360, 40d supply, fill #0

## 2021-12-03 MED ORDER — AMOXICILLIN-POT CLAVULANATE 875-125 MG PO TABS
1.0000 | ORAL_TABLET | Freq: Two times a day (BID) | ORAL | 0 refills | Status: AC
  Filled 2021-12-03: qty 10, 5d supply, fill #0

## 2021-12-03 MED ORDER — ALBUTEROL SULFATE HFA 108 (90 BASE) MCG/ACT IN AERS
2.0000 | INHALATION_SPRAY | RESPIRATORY_TRACT | Status: DC | PRN
  Filled 2021-12-03: qty 6.7

## 2021-12-03 MED ORDER — PREDNISONE 20 MG PO TABS
ORAL_TABLET | ORAL | 0 refills | Status: AC
  Filled 2021-12-03: qty 15, 12d supply, fill #0

## 2021-12-03 NOTE — Discharge Summary (Signed)
Physician Discharge Summary   Patient: Karla Hoover MRN: [id number] DOB: Dec 10, 1970  Admit date:     12/02/2021  Discharge date: 12/03/21  Discharge Physician: Karie Kirks   PCP: Patient, [name]   Recommendations at discharge:    The patient is being discharged back to her facility. She should have glucoses checked bid while on steroids. Follow SSI for glucoses as previous. Patient is to follow up with PCP in 7-10 days. CBC and chemistry should be checked on that visit.  Discharge Diagnoses: Principal Problem:   Sepsis due to undetermined organism [facility]) Active Problems:   Essential hypertension   Bilateral pulmonary embolism (HCC)   History of stroke   Type 2 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 disease, with long-term current use of insulin (HCC)   Mixed 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 ([place])   GERD without esophagitis   Hypothyroidism   Grade I diastolic dysfunction   Mild protein-calorie malnutrition ([place])   COPD with acute exacerbation (HCC)  Resolved Problems:   * No resolved hospital problems. *   Hospital Course:  Karla Hoover is a 51 y.o. female with medical history significant of asthma, stage 3a CKD, COPD with chronic bronchitis and chronic respiratory failure with hypoxia, type II DM, hypertension, GERD, grade 1 diastolic dysfunction, hypothyroidism, hyperlipidemia, pulmonary embolism, DVT, history of other non-hemorrhagic stroke with residual left-sided hemiparesis who is brought from her residential facility due to progressively worse yellowish sputum productive cough, fever, sore throat, rhinorrhea, fatigue, malaise, wheezing and dyspnea for the past week.  Not sure of sick contacts.  No travel history other than going locally to her sister's wedding.  Positive abdominal pain with cough.  She has been nauseous, but no vomiting, no diarrhea, constipation, melena or hematochezia.  No flank pain, dysuria  or frequency.  However, she has been urinating less and her urine looks more amber-colored than usual.  She last urinated at the facility before coming to the hospital.  No chest pain, palpitations, PND, orthopnea or recent lower extremity edema.  No polyuria, polydipsia, polyphagia or blurry vision.  ED course: Initial vital signs were temperature 100.9 F, pulse 142, respirations 22, BP 141/70 mmHg O2 sat 99% on 2 LPM via nasal cannula.  The patient received 3000 mL of LR bolus, acetaminophen 1000 mg p.o. x1, DuoNeb, methylprednisolone 125 mg IVP x1, 2 g of cefepime IVPB and vancomycin per pharmacy.  She is now afebrile and her heart rate has decreased to the 100s and 110.  Her most recent respiratory rate is normal.   Lab work: CBC is her white count of 14.1 with 84% neutrophils, hemoglobin 12.3 g/dL platelets 243.  PT 16.3, INR 1.3 and PTT 33.  Lactic acid 2.3 and then 1.6 mmol/L.  CMP showed a glucose of 241 and creatinine of 1.36 mg/dL.  Albumin was 3.3 g/dL.  The rest of the CMP measurements are normal when calcium and sodium are corrected.  Urine analysis has still not collected.   Imaging: Portable 1 view chest radiograph showed normal heart size and vascular pattern.  There was aortic atherosclerosis with stable mediastinum.    The patient was given 125 of solumedrol IV. She was started on IV cefepime, and given nebulizer treatments as well as needed albuterol.   This morning the patient was saturating in the 90's at rest on room air. She was ambulated on room air and saturated 94% with ambulation.  She will be discharged to the  facility where she resides today in fair condition.    Assessment and Plan: Assessment and Plan: COPD with acute exacerbation [facility]) The patient was admitted to a telemetry bed. She has received 125 mg of solumedrol IV. She has been receiving nebulizer treatments. Her respiratory status has returned to baseline. She is saturating 94% on room air with ambulation.  She will be discharged back to her facility today.  Hypothyroidism- (present on admission) Continue levothyroxine 75 mcg po daily.  GERD without esophagitis- (present on admission) Continue PPI at discharge.  Bilateral pulmonary embolism (HCC)- (present on admission) 11/15/2019-CTA chest-positive for acute PE with CT evidence of right heart strain consistent with at least submassive PE  11/16/2019-echocardiogram-LV ejection fraction 65 to 70%, right ventricle systolic function is severely reduced, right ventricular systolic pressure is 0000000  Continue Eliquis on discharge.  Essential hypertension- (present on admission) The patient is normotensive on Cozaar and metoprolol. These will be continued on discharge  I have seen and examined this patient myself. I have spent 38 minutes in her evaluation and discharge.  Consultants: None Procedures performed: None  Disposition: Long term care facility Diet recommendation:  Discharge Diet Orders (From admission, onward)     Start     Ordered   12/03/21 0000  Diet - low sodium heart healthy        12/03/21 1303   12/03/21 0000  Diet Carb Modified        12/03/21 1303           Cardiac and Carb modified diet  DISCHARGE MEDICATION: Allergies as of 12/03/2021       Reactions   Guaifenesin Anaphylaxis   Kiwi Extract Anaphylaxis, Rash   Levaquin [levofloxacin In D5w] Shortness Of Breath, Rash   Strawberry Extract Anaphylaxis, Rash   Baclofen Other (See Comments)   Near syncope/ fall        Medication List     STOP taking these medications    CEPACOL SORE THROAT PO       TAKE these medications    acetaminophen 500 MG tablet Commonly known as: TYLENOL Take 1,000 mg by mouth in the morning, at noon, and at bedtime.   albuterol 108 (90 Base) MCG/ACT inhaler Commonly known as: VENTOLIN HFA Inhale 2 puffs into the lungs every 6 (six) hours as needed for wheezing or shortness of breath. What changed: reasons to take this    amoxicillin-clavulanate 875-125 MG tablet Commonly known as: Augmentin Take 1 tablet by mouth 2 (two) times daily for 5 days.   apixaban 5 MG Tabs tablet Commonly known as: Eliquis Take 2 tablets (10mg ) twice daily for 7 days, then 1 tablet (5mg ) twice daily What changed:  how much to take how to take this when to take this additional instructions   ascorbic acid 500 MG tablet Commonly known as: VITAMIN C Take 500 mg by mouth daily.   aspirin 81 MG chewable tablet Chew 81 mg by mouth in the morning.   atorvastatin 80 MG tablet Commonly known as: LIPITOR Take 1 tablet (80 mg total) by mouth daily. What changed: when to take this   Biofreeze 5 % Ptch Generic drug: Menthol Apply 1 patch topically daily. Apply to posterior neck one time a day, and take off qhs   chlorhexidine 0.12 % solution Commonly known as: PERIDEX Use as directed 15 mLs in the mouth or throat daily.   cyclobenzaprine 10 MG tablet Commonly known as: FLEXERIL Take 1 tablet (10 mg total) by mouth 3 (  three) times daily as needed for muscle spasms. What changed: when to take this   Fish Oil 1000 MG Caps Take 1,000 mg by mouth in the morning and at bedtime.   fluticasone 50 MCG/ACT nasal spray Commonly known as: FLONASE Place 1 spray into both nostrils in the morning and at bedtime. For uri; written to use for 10 days. Began [id number]   folic acid [id number] MCG tablet Commonly known as: FOLVITE Take 800 mcg by mouth daily.   gabapentin 300 MG capsule Commonly known as: NEURONTIN Take 1 capsule (300 mg total) by mouth 3 (three) times daily.   guaiFENesin 600 MG 12 hr tablet Commonly known as: MUCINEX Take 600 mg by mouth 2 (two) times daily. Pt is to take for 1 week. Began 11/26/21   HYDROCORTISONE (TOPICAL) 1 % Soln Apply 1 application topically 2 (two) times daily as needed (psoriasis). Apply to scalp, limb rash topically as needed for psoriasis bid prn.   insulin glargine 100 UNIT/ML  injection Commonly known as: LANTUS Inject 50 Units into the skin daily.   insulin lispro 100 UNIT/ML injection Commonly known as: HUMALOG Inject 0-14 Units into the skin in the morning and at bedtime. Per sliding scale: 0-200=0/no insulin; 201-250=2 units; 251-300=4 units; 301-350=6 units; 351-400=8 units: 401-450= 10 units; 451-500=12 units/ if BS reads high, inject 14 units of either Humalog or Novolog.   ipratropium-albuterol 0.5-2.5 (3) MG/3ML Soln Commonly known as: DUONEB Inhale 1 vial via nebulizer 3 (three) times daily.   levothyroxine 75 MCG tablet Commonly known as: SYNTHROID Take 75 mcg by mouth daily before breakfast.   lidocaine 5 % Commonly known as: Lidoderm Place 1 patch onto the skin daily. Remove & Discard patch within 12 hours or as directed by MD What changed: additional instructions   linagliptin 5 MG Tabs tablet Commonly known as: TRADJENTA Take 5 mg by mouth daily.   loperamide 2 MG tablet Commonly known as: IMODIUM A-D Take 2-4 mg by mouth See admin instructions. May give 4mg  after loose stool, then 2mg  after each additional loose stool. Do not exceed 8mg  in 24 hours.   losartan 100 MG tablet Commonly known as: COZAAR Take 100 mg by mouth daily.   MAALOX PLUS PO Take 30 mLs by mouth every 6 (six) hours as needed (heartburn and/or indigestion). Maalox Plus Suspension 225-200-25mg /12mL   metFORMIN 500 MG 24 hr tablet Commonly known as: GLUCOPHAGE-XR Take 500 mg by mouth daily with breakfast.   metoprolol tartrate 25 MG tablet Commonly known as: LOPRESSOR Take 1 tablet (25 mg total) by mouth 2 (two) times daily.   omeprazole 20 MG capsule Commonly known as: PRILOSEC Take 20 mg by mouth daily.   predniSONE 20 MG tablet Commonly known as: DELTASONE Take 2 tablets (40 mg total) by mouth daily for 3 days, 1&1/2 tablets (30 mg total) daily for 3 days, 1 tablet (20 mg total) daily for 3 days, then 1/2 tablet (10 mg total) daily for 3 days. Start  taking on: December 03, 2021   Symbicort 80-4.5 MCG/ACT inhaler Generic drug: budesonide-formoterol Inhale 2 puffs into the lungs 2 (two) times daily.   vitamin B-12 500 MCG tablet Commonly known as: CYANOCOBALAMIN Take 500 mcg by mouth daily.        Discharge Exam: Vitals:   12/03/21 1003 12/03/21 1223  BP: 120/70   Pulse:    Resp:    Temp:    SpO2:  96%   Exam:  Constitutional:  The patient is awake, alert,  and oriented x 3. No acute distress. Respiratory:  No increased work of breathing. No wheezes, rales, or rhonchi No tactile fremitus Cardiovascular:  Regular rate and rhythm No murmurs, ectopy, or gallups. No lateral PMI. No thrills. Abdomen:  Abdomen is soft, non-tender, non-distended No hernias, masses, or organomegaly Normoactive bowel sounds.  Musculoskeletal:  No cyanosis, clubbing, or edema Skin:  No rashes, lesions, ulcers palpation of skin: no induration or nodules Neurologic:  CN 2-12 intact Sensation all 4 extremities intact Psychiatric:  Mental status Mood, affect appropriate Orientation to person, place, time  judgment and insight appear intact    Condition at discharge: fair  The results of significant diagnostics from this hospitalization (including imaging, microbiology, ancillary and laboratory) are listed below for reference.   Imaging Studies: CT HEAD WO CONTRAST  Result Date: 11/09/2021 CLINICAL DATA:  Mental status change.  Unknown cause. EXAM: CT HEAD WITHOUT CONTRAST TECHNIQUE: Contiguous axial images were obtained from the base of the skull through the vertex without intravenous contrast. RADIATION DOSE REDUCTION: This exam was performed according to the departmental dose-optimization program which includes automated exposure control, adjustment of the mA and/or kV according to patient size and/or use of iterative reconstruction technique. COMPARISON:  CT head 05/29/2021. FINDINGS: Brain: There is no evidence of acute  intracranial hemorrhage, mass lesion, brain edema or extra-axial fluid collection. Stable atrophy with mild prominence of the ventricles and subarachnoid spaces. Stable old infarct in the right basal ganglia with associated ipsilateral lateral ventricular dilatation. There is no CT evidence of acute cortical infarction. Vascular: Intracranial vascular calcifications. No hyperdense vessel identified. Skull: Negative for fracture or focal lesion. Sinuses/Orbits: The visualized paranasal sinuses and mastoid air cells are clear. No orbital abnormalities are seen. Other: None. IMPRESSION: Stable head CT without acute intracranial findings. Stable remote right basal ganglial infarcts. Electronically Signed   By: Carey Bullocks M.D.   On: 11/09/2021 15:38   DG Chest Port 1 View  Result Date: 12/02/2021 CLINICAL DATA:  Questionable sepsis. EXAM: PORTABLE CHEST 1 VIEW COMPARISON:  Portable chest 11/09/2021. FINDINGS: Heart size and vascular pattern are normal. There is aortic atherosclerosis with stable mediastinum. The lungs clear mild COPD change. No pleural effusion is seen. Thoracic spondylosis. IMPRESSION: No evidence of acute chest disease or interval changes. Electronically Signed   By: Almira Bar M.D.   On: 12/02/2021 07:21   DG Chest Port 1 View  Result Date: 11/09/2021 CLINICAL DATA:  ALOC EXAM: PORTABLE CHEST 1 VIEW COMPARISON:  05/09/2021. FINDINGS: Relative lucency of the right chest is favored to relate to patient rotation. No confluent consolidation. No visible pleural effusions or pneumothorax. Cardiomediastinal silhouette is within normal limits. IMPRESSION: No evidence of acute cardiopulmonary disease. Electronically Signed   By: Feliberto Harts M.D.   On: 11/09/2021 15:32    Microbiology: Results for orders placed or performed during the hospital encounter of 12/02/21  Resp Panel by RT-PCR (Flu A&B, Covid) Nasopharyngeal Swab     Status: None   Collection Time: 12/02/21  6:34 AM    Specimen: Nasopharyngeal Swab; Nasopharyngeal(NP) swabs in vial transport medium  Result Value Ref Range Status   SARS Coronavirus 2 by RT PCR NEGATIVE NEGATIVE Final    Comment: (NOTE) SARS-CoV-2 target nucleic acids are NOT DETECTED.  The SARS-CoV-2 RNA is generally detectable in upper respiratory specimens during the acute phase of infection. The lowest concentration of SARS-CoV-2 viral copies this assay can detect is 138 copies/mL. A negative result does not preclude SARS-Cov-2 infection and should not  be used as the sole basis for treatment or other patient management decisions. A negative result may occur with  improper specimen collection/handling, submission of specimen other than nasopharyngeal swab, presence of viral mutation(s) within the areas targeted by this assay, and inadequate number of viral copies(<138 copies/mL). A negative result must be combined with clinical observations, patient history, and epidemiological information. The expected result is Negative.  Fact Sheet for Patients:  EntrepreneurPulse.com.au  Fact Sheet for Healthcare Providers:  IncredibleEmployment.be  This test is no t yet approved or cleared by the [place] FDA and  has been authorized for detection and/or diagnosis of SARS-CoV-2 by FDA under an Emergency Use Authorization (EUA). This EUA will remain  in effect (meaning this test can be used) for the duration of the COVID-19 declaration under Section 564(b)(1) of the Act, 21 U.S.C.section 360bbb-3(b)(1), unless the authorization is terminated  or revoked sooner.       Influenza A by PCR NEGATIVE NEGATIVE Final   Influenza B by PCR NEGATIVE NEGATIVE Final    Comment: (NOTE) The Xpert Xpress SARS-CoV-2/FLU/RSV plus assay is intended as an aid in the diagnosis of influenza from Nasopharyngeal swab specimens and should not be used as a sole basis for treatment. Nasal washings and aspirates are  unacceptable for Xpert Xpress SARS-CoV-2/FLU/RSV testing.  Fact Sheet for Patients: EntrepreneurPulse.com.au  Fact Sheet for Healthcare Providers: IncredibleEmployment.be  This test is not yet approved or cleared by the [place] FDA and has been authorized for detection and/or diagnosis of SARS-CoV-2 by FDA under an Emergency Use Authorization (EUA). This EUA will remain in effect (meaning this test can be used) for the duration of the COVID-19 declaration under Section 564(b)(1) of the Act, 21 U.S.C. section 360bbb-3(b)(1), unless the authorization is terminated or revoked.  Performed at [facility], [place] [address], [place], [place] [address]   Blood Culture (routine x 2)     Status: None (Preliminary result)   Collection Time: 12/02/21  6:43 AM   Specimen: BLOOD  Result Value Ref Range Status   Specimen Description   Final    BLOOD BLOOD RIGHT FOREARM Performed at [facility] [address], [place], [place] [address]    Special Requests   Final    BOTTLES DRAWN AEROBIC AND ANAEROBIC Blood Culture adequate volume Performed at [facility] [address], [place], [place] [address]    Culture   Final    NO GROWTH < 24 HOURS Performed at [facility] [address], [place], [place] [address]    Report Status PENDING  Incomplete  Blood Culture (routine x 2)     Status: None (Preliminary result)   Collection Time: 12/02/21  6:48 AM   Specimen: BLOOD  Result Value Ref Range Status   Specimen Description   Final    BLOOD RIGHT ANTECUBITAL Performed at [facility] [address] [place], [place] [address]    Special Requests   Final    BOTTLES DRAWN AEROBIC AND ANAEROBIC Blood Culture adequate volume Performed at [facility] [address] [place], [place] [address]    Culture   Final    NO GROWTH < 24 HOURS Performed at  [facility] [address] [place], [place] [address]    Report Status PENDING  Incomplete    Labs: CBC: Recent Labs  Lab 12/02/21 0648 12/03/21 0443  WBC 14.1* 17.2*  NEUTROABS 11.9* 14.5*  HGB 12.3  11.2*  HCT 38.7 35.5*  MCV 86.2 87.0  PLT 243 [id number]   Basic Metabolic Panel: Recent Labs  Lab 12/02/21 0648 12/03/21 0443  NA 134* 132*  K 4.1 4.2  CL 100 102  CO2 25 22  GLUCOSE 241* 331*  BUN 17 21*  CREATININE 1.36* 1.14*  CALCIUM 8.7* 8.6*  MG 1.7  --   PHOS 1.7*  --    Liver Function Tests: Recent Labs  Lab 12/02/21 0648  AST 16  ALT 10  ALKPHOS 92  BILITOT 0.4  PROT 7.4  ALBUMIN 3.3*   CBG: Recent Labs  Lab 12/02/21 1158 12/02/21 1608 12/02/21 2132 12/03/21 0749 12/03/21 1127  GLUCAP 318* 384* 376* 269* 343*    Discharge time spent: greater than 30 minutes.  Signed: Karie Kirks, DO [facility] 12/03/2021

## 2021-12-03 NOTE — Evaluation (Signed)
Physical Therapy Evaluation Patient Details Name: CHRISTINIA POLICASTRO MRN: [id number] DOB: 1970-10-22 Today's Date: 12/03/2021  History of Present Illness  51 y.o. female with medical history significant of asthma, stage 3a CKD, COPD with chronic bronchitis and chronic respiratory failure with hypoxia, type II DM, hypertension, GERD, grade 1 diastolic dysfunction, hypothyroidism, hyperlipidemia, pulmonary embolism, DVT, history of other non-hemorrhagic stroke with residual left-sided hemiparesis who is brought from her residential facility due to progressively worse yellowish sputum productive cough, fever, sore throat, rhinorrhea, fatigue, malaise, wheezing and dyspnea x 1 wk  Clinical Impression      Patient evaluated by Physical Therapy with no further acute PT needs identified. All education has been completed and the patient has no further questions.  Pt is grossly at her baseline, plan is to return to [facility]  See below for any follow-up Physical Therapy or equipment needs. PT is signing off. Thank you for this referral.    Recommendations for follow up therapy are one component of a multi-disciplinary discharge planning process, led by the attending physician.  Recommendations may be updated based on patient status, additional functional criteria and insurance authorization.  Follow Up Recommendations No PT follow up    Assistance Recommended at Discharge Intermittent Supervision/Assistance  Patient can return home with the following  A little help with walking and/or transfers;Help with stairs or ramp for entrance;Assistance with cooking/housework;Assist for transportation    Equipment Recommendations None recommended by PT  Recommendations for Other Services       Functional Status Assessment Patient has not had a recent decline in their functional status     Precautions / Restrictions Precautions Precautions: Fall Restrictions Weight Bearing Restrictions: No       Mobility  Bed Mobility Overal bed mobility: Modified Independent             General bed mobility comments: use of rail, HOB elevated    Transfers Overall transfer level: Needs assistance Equipment used: None Transfers: Sit to/from Stand Sit to Stand: Supervision           General transfer comment: for safety    Ambulation/Gait Ambulation/Gait assistance: Min guard Gait Distance (Feet): 80 Feet Assistive device: Rolling walker (2 wheels) (single handon RW vs rail in hallway to simulate hemiwalker) Gait Pattern/deviations: Step-to pattern, Decreased stance time - left, Decreased weight shift to left, Knee hyperextension - left, Decreased dorsiflexion - left       General Gait Details: min/guard for safety, no overt LOB, deviations as noted above which is pt baseline  Science writer    Modified Rankin (Stroke Patients Only)       Balance Overall balance assessment: Needs assistance Sitting-balance support: No upper extremity supported, Feet supported Sitting balance-Leahy Scale: Fair       Standing balance-Leahy Scale: Fair                               Pertinent Vitals/Pain Pain Assessment Pain Assessment: No/denies pain    Home Living Family/patient expects to be discharged to:: Skilled nursing facility                   Additional Comments: at [facility]    Prior Function               Mobility Comments: pt reports mod I with gait-amb with hemiwalker, amb to bathroom,  amb in  hallway longer distances pulling w/c behind her with a gait belt (for seated rest when she fatigues)       Hand Dominance        Extremity/Trunk Assessment   Upper Extremity Assessment Upper Extremity Assessment: Defer to OT evaluation    Lower Extremity Assessment Lower Extremity Assessment: LLE deficits/detail LLE Deficits / Details: baseline deficits d/t CVA. ankle df 0/5, knee and hip grossly 2+ to  3/5, [facility]       Communication   Communication: No difficulties  Cognition Arousal/Alertness: Awake/alert Behavior During Therapy: WFL for tasks assessed/performed Overall Cognitive Status: Within Functional Limits for tasks assessed                                          General Comments      Exercises     Assessment/Plan    PT Assessment Patient does not need any further PT services  PT Problem List         PT Treatment Interventions      PT Goals (Current goals can be found in the Care Plan section)  Acute Rehab PT Goals PT Goal Formulation: All assessment and education complete, DC therapy    Frequency       Co-evaluation               AM-PAC PT "6 Clicks" Mobility  Outcome Measure Help needed turning from your back to your side while in a flat bed without using bedrails?: A Little Help needed moving from lying on your back to sitting on the side of a flat bed without using bedrails?: A Little Help needed moving to and from a bed to a chair (including a wheelchair)?: None Help needed standing up from a chair using your arms (e.g., wheelchair or bedside chair)?: None Help needed to walk in hospital room?: None Help needed climbing 3-5 steps with a railing? : A Little 6 Click Score: 21    End of Session Equipment Utilized During Treatment: Gait belt Activity Tolerance: Patient tolerated treatment well Patient left: in chair;with call bell/phone within reach;with family/visitor present;with chair alarm set Nurse Communication: Mobility status PT Visit Diagnosis: Other abnormalities of gait and mobility (R26.89)    Time: [id number] PT Time Calculation (min) (ACUTE ONLY): 27 min   Charges:   PT Evaluation $PT Eval Low Complexity: 1 Low PT Treatments $Gait Training: 8-22 mins        Baxter Flattery, PT  Acute Rehab Dept ([place]) [phone] Pager [phone]  12/03/2021   [facility] 12/03/2021, 11:02 AM

## 2021-12-03 NOTE — Assessment & Plan Note (Signed)
-  Continue PPI at discharge. 

## 2021-12-03 NOTE — TOC Initial Note (Signed)
Transition of Care [facility](TOC) - Initial/Assessment Note    Patient Details  Name: Karla Hoover MRN: [id number] Date of Birth: 1971-03-13  Transition of Care [facility](TOC) CM/SW Contact:    Lanier ClamMahabir, Hazelynn Mckenny, RN Phone Number: 12/03/2021, 9:57 AM  Clinical Narrative: [facility]                  Expected Discharge Plan: Long Term Nursing Home Barriers to Discharge: Continued Medical Work up   Patient Goals and CMS Choice Patient states their goals for this hospitalization and ongoing recovery are:: [facility] to return CMS Medicare.gov Compare Post Acute Care list provided to:: Patient Represenative (must comment) (mother Rinaldo Cloudamela [phone]) Choice offered to / list presented to : Parent  Expected Discharge Plan and Services Expected Discharge Plan: Long Term Nursing Home   Discharge Planning Services: CM Consult Post Acute Care Choice: Nursing Home Living arrangements for the past 2 months: Skilled Nursing Facility                                      Prior Living Arrangements/Services Living arrangements for the past 2 months: Skilled Nursing Facility Lives with:: Facility Resident Patient language and need for interpreter reviewed:: Yes Do you feel safe going back to the place where you live?: Yes      Need for Family Participation in Patient Care: Yes (Comment) Care giver support system in place?: Yes (comment)   Criminal Activity/Legal Involvement Pertinent to Current Situation/Hospitalization: No - Comment as needed  Activities of Daily Living Home Assistive Devices/Equipment: Wheelchair ADL Screening (condition at time of admission) Patient's cognitive ability adequate to safely complete daily activities?: Yes Is the patient deaf or have difficulty hearing?: No Does the patient have difficulty seeing, even when wearing glasses/contacts?: No Does the patient have difficulty concentrating, remembering, or making decisions?: No Patient able to  express need for assistance with ADLs?: Yes Does the patient have difficulty dressing or bathing?: Yes Independently performs ADLs?: No Does the patient have difficulty walking or climbing stairs?: Yes Weakness of Legs: Both Weakness of Arms/Hands: Both  Permission Sought/Granted Permission sought to share information with : Case Manager Permission granted to share information with : Yes, Verbal Permission Granted  Share Information with NAME: Case manager           Emotional Assessment              Admission diagnosis:  COPD exacerbation (HCC) [J44.1] Sepsis due to undetermined organism (HCC) [A41.9] Sepsis, due to unspecified organism, unspecified whether acute organ dysfunction present [facility](HCC) [A41.9] Patient Active Problem List   Diagnosis Date Noted   Sepsis due to undetermined organism (HCC) 12/02/2021   Grade I diastolic dysfunction 12/02/2021   Mild protein-calorie malnutrition (HCC) 12/02/2021   COPD with acute exacerbation (HCC) 12/02/2021   Chest pain 05/29/2021   Acute renal failure superimposed on stage 3a chronic kidney disease (HCC) 02/09/2021   Chronic respiratory failure with hypoxia (HCC) 02/09/2021   History of thromboembolism 02/09/2021   Type 2 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 disease, with long-term current use of insulin (HCC) 02/09/2021   Mixed 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 (HCC) 02/09/2021   GERD without esophagitis 02/09/2021   Hypothyroidism 02/09/2021   Pyelonephritis of right kidney 02/09/2021   Sepsis secondary to UTI (HCC) 02/08/2021   Lymphadenopathy 12/28/2020   VTE (venous thromboembolism) 12/01/2019  Overweight (BMI 25.0-29.9) 11/22/2019   Chronic constipation 11/22/2019   Tobacco abuse 11/16/2019   Elevated troponin 11/16/2019   Bilateral pulmonary embolism ([place]) 11/15/2019   DM (diabetes mellitus), type 2 ([place]) 11/15/2019   History of stroke 11/15/2019   Essential hypertension  05/04/2009   COPD with chronic bronchitis ([place]) 05/04/2009   PCP:  Patient, [name] Pharmacy:   [facility] [place] [address] Phone: [phone] Fax: [phone]     Social Determinants of Health (SDOH) Interventions    Readmission Risk Interventions Readmission Risk Prevention Plan 12/03/2021  Transportation Screening Complete  PCP or Specialist Appt within 3-5 Days Complete  HRI or [place] Complete  Social Work Consult for [place] Planning/Counseling Complete  Palliative Care Screening Complete  Medication Review Press photographer) Complete  Some recent data might be hidden

## 2021-12-03 NOTE — Assessment & Plan Note (Signed)
The patient was admitted to a telemetry bed. She has received 125 mg of solumedrol IV. She has been receiving nebulizer treatments. Her respiratory status has returned to baseline. She is saturating 94% on room air with ambulation. She will be discharged back to her facility today.

## 2021-12-03 NOTE — Evaluation (Signed)
Occupational Therapy Evaluation Patient Details Name: Karla Hoover MRN: [id number] DOB: December 05, 1970 Today's Date: 12/03/2021   History of Present Illness 51 y.o. female with medical history significant of asthma, stage 3a CKD, COPD with chronic bronchitis and chronic respiratory failure with hypoxia, type II DM, hypertension, GERD, grade 1 diastolic dysfunction, hypothyroidism, hyperlipidemia, pulmonary embolism, DVT, history of other non-hemorrhagic stroke with residual left-sided hemiparesis who is brought from her residential facility due to progressively worse yellowish sputum productive cough, fever, sore throat, rhinorrhea, fatigue, malaise, wheezing and dyspnea x 1 wk   Clinical Impression   Pt greeted in room, agreeable to OT evaluation. Pt mother present throughout. Pt is alert and oriented x4. Pt reports she is a LTC resident, completes ADLs with generally MOD I, assist provided at times. IADLs require assist, which is provided through LTC. Pt appears to be performing ADL at or close to baseline level. She reports minimal increased tone throughout LUE, however reports continued performance of LUE HEP. Because pt is performing ADL close to baseline, no further OT recommended at this time. Pt is left inc are of nurse in bedside chair, NAD, all needs met. Please reconsult if there is a change in functional status.      Recommendations for follow up therapy are one component of a multi-disciplinary discharge planning process, led by the attending physician.  Recommendations may be updated based on patient status, additional functional criteria and insurance authorization.   Follow Up Recommendations  No OT follow up    Assistance Recommended at Discharge Intermittent Supervision/Assistance  Patient can return home with the following A little help with walking and/or transfers;Direct supervision/assist for medications management;Assist for transportation;A little help with  bathing/dressing/bathroom    Functional Status Assessment  Patient has not had a recent decline in their functional status  Equipment Recommendations  None recommended by OT    Recommendations for Other Services       Precautions / Restrictions Precautions Precautions: Fall Restrictions Weight Bearing Restrictions: No      Mobility Bed Mobility               General bed mobility comments: recieved in chair    Transfers Overall transfer level: Needs assistance Equipment used: None Transfers: Sit to/from Stand Sit to Stand: Supervision                  Balance Overall balance assessment: Needs assistance Sitting-balance support: No upper extremity supported, Feet supported Sitting balance-Leahy Scale: Fair     Standing balance support: During functional activity Standing balance-Leahy Scale: Fair                             ADL either performed or assessed with clinical judgement   ADL Overall ADL's : At baseline;Needs assistance/impaired Eating/Feeding: Set up   Grooming: Wash/dry hands;Wash/dry face;Applying deodorant;Supervision/safety;Sitting   Upper Body Bathing: Set up;Sitting   Lower Body Bathing: Set up;Sitting/lateral leans   Upper Body Dressing : Sitting;Minimal assistance       Toilet Transfer: Min guard;Rolling walker (2 wheels) Toilet Transfer Details (indicate cue type and reason): simulated         Functional mobility during ADLs: Rolling walker (2 wheels);Min guard;Minimal assistance       Vision Patient Visual Report: No change from baseline       Perception     Praxis      Pertinent Vitals/Pain Pain Assessment Pain Assessment: 0-10 Pain Score: 6  Pain Location: back- chronic Pain Descriptors / Indicators: Constant Pain Intervention(s): Limited activity within patient's tolerance, Monitored during session, Repositioned     Hand Dominance     Extremity/Trunk Assessment Upper Extremity  Assessment Upper Extremity Assessment: LUE deficits/detail LUE Deficits / Details: pt reports she is baseline for LUE function- pt is L hemi following a stroke in 05/2019; Less than 1/4 AROM in shoulder, no AROM in elbow or wrist; Sublux with pain noted with PROM at L shoulder; elbow PROM approx 3/4 full ROM, wrist 3/4 full ROM, spasticity throughout; Pt reports continued performance of HEP   Lower Extremity Assessment Lower Extremity Assessment: LLE deficits/detail LLE Deficits / Details: baseline deficits d/t CVA. ankle df 0/5, knee and hip grossly 2+ to 3/5, AAROM [facility]       Communication Communication Communication: No difficulties   Cognition Arousal/Alertness: Awake/alert Behavior During Therapy: WFL for tasks assessed/performed Overall Cognitive Status: Within Functional Limits for tasks assessed                                 General Comments: alert and oriented x4     General Comments  vss throughout    Exercises     Shoulder Instructions      Home Living Family/patient expects to be discharged to:: Skilled nursing facility                                 Additional Comments: [facility]      Prior Functioning/Environment               Mobility Comments: pt reports mod I with hemi walker ADLs Comments: pt reports sup-MOD I for dressing; MIN A for bathing with shower chair, I for feeding        OT Problem List:        OT Treatment/Interventions:      OT Goals(Current goals can be found in the care plan section) Acute Rehab OT Goals Patient Stated Goal: back to LTC facility OT Goal Formulation: With patient Time For Goal Achievement: 12/17/21 Potential to Achieve Goals: Good  OT Frequency:      Co-evaluation              AM-PAC OT "6 Clicks" Daily Activity     Outcome Measure Help from another person eating meals?: None Help from another person taking care of personal grooming?: A Little Help from another  person toileting, which includes using toliet, bedpan, or urinal?: A Little Help from another person bathing (including washing, rinsing, drying)?: A Little Help from another person to put on and taking off regular upper body clothing?: A Little Help from another person to put on and taking off regular lower body clothing?: A Little 6 Click Score: 19   End of Session Equipment Utilized During Treatment: Rolling walker (2 wheels) Nurse Communication: Mobility status  Activity Tolerance: Patient tolerated treatment well Patient left: in chair;with call bell/phone within reach;with chair alarm set;with family/visitor present;with nursing/sitter in room  OT Visit Diagnosis: Unsteadiness on feet (R26.81)                Time: 2951-8841 OT Time Calculation (min): 26 min Charges:  OT General Charges $OT Visit: 1 Visit OT Evaluation $OT Eval Moderate Complexity: 1 Mod OT Treatments $Self Care/Home Management : 8-22 mins  Shanon Payor, OTD OTR/L  12/03/21, 12:39 PM

## 2021-12-03 NOTE — TOC Transition Note (Addendum)
Transition of Care [facility]) - CM/SW Discharge Note   Patient Details  Name: DARCELLA SHIFFMAN MRN: [id number] Date of Birth: 1971/02/01  [facility] CM/SW Contact:  Lanier Clam, RN Phone Number: 12/03/2021, 1:05 PM   Clinical Narrative:  Return back to [facility] going to rm#[id number], nsg report tel#[phone]. PTAR once d/c summary available.  -1:48p-Confirmed w/[facility] they can manage all meds on d/c summary-specifically-duoneb,prednisone,augmentin.PTAR called.    Final next level of care: Long Term Nursing Home Barriers to Discharge: No Barriers Identified   Patient Goals and CMS Choice Patient states their goals for this hospitalization and ongoing recovery are:: [facility] to return CMS Medicare.gov Compare Post Acute Care list provided to:: Patient Represenative (must comment) (mother Rinaldo Cloud [phone]) Choice offered to / list presented to : Parent  Discharge Placement                       Discharge Plan and Services   Discharge Planning Services: CM Consult Post Acute Care Choice: Nursing Home                               Social Determinants of Health (SDOH) Interventions     Readmission Risk Interventions Readmission Risk Prevention Plan 12/03/2021  Transportation Screening Complete  PCP or Specialist Appt within 3-5 Days Complete  HRI or Home Care Consult Complete  Social Work Consult for Recovery Care Planning/Counseling Complete  Palliative Care Screening Complete  Medication Review Oceanographer) Complete  Some recent data might be hidden

## 2021-12-03 NOTE — Assessment & Plan Note (Signed)
The patient is normotensive on Cozaar and metoprolol. These will be continued on discharge

## 2021-12-03 NOTE — NC FL2 (Cosign Needed)
[place] MEDICAID FL2 LEVEL OF CARE SCREENING TOOL     IDENTIFICATION  Patient Name: Karla Hoover Birthdate: 01/27/1971 Sex: female Admission Date (Current Location): 12/02/2021  [facility] and [place] Number:  Herbalist and Address:  [facility],  [address], [place]      Provider Number: [id number]  Attending Physician Name and Address:  Karie Kirks, DO  Relative Name and Phone Number:  Tori Milks mother [phone]    Current Level of Care: Hospital Recommended Level of Care: Nursing Facility Prior Approval Number:    Date Approved/Denied:   PASRR Number:    Discharge Plan: Other (Comment) (LTC)    Current Diagnoses: Patient Active Problem List   Diagnosis Date Noted   Sepsis due to undetermined organism ([place]) 12/02/2021   Grade I diastolic dysfunction [id number]   Mild protein-calorie malnutrition ([place]) 12/02/2021   COPD with acute exacerbation ([place]) 12/02/2021   Chest pain 05/29/2021   Acute renal failure superimposed on stage 3a chronic kidney disease ([place]) 02/09/2021   Chronic respiratory failure with hypoxia ([place]) 02/09/2021   History of thromboembolism 02/09/2021   Type 2 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 disease, with long-term current use of insulin ([place]) 02/09/2021   Mixed 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 ([place]) 02/09/2021   GERD without esophagitis 02/09/2021   Hypothyroidism 02/09/2021   Pyelonephritis of right kidney 02/09/2021   Sepsis secondary to UTI ([place]) 02/08/2021   Lymphadenopathy 12/28/2020   VTE (venous thromboembolism) 12/01/2019   Overweight (BMI 25.0-29.9) 11/22/2019   Chronic constipation 11/22/2019   Tobacco abuse 11/16/2019   Elevated troponin 11/16/2019   Bilateral pulmonary embolism ([place]) 11/15/2019   DM (diabetes mellitus), type 2 ([place]) 11/15/2019   History of stroke 11/15/2019   Essential hypertension 05/04/2009   COPD with chronic bronchitis  ([place]) 05/04/2009    Orientation RESPIRATION BLADDER Height & Weight     Self, Time, Situation, Place  Normal Incontinent Weight:   Height:     BEHAVIORAL SYMPTOMS/MOOD NEUROLOGICAL BOWEL NUTRITION STATUS      Incontinent Diet (Heart Healthy)  AMBULATORY STATUS COMMUNICATION OF NEEDS Skin   Extensive Assist Verbally Normal                       Personal Care Assistance Level of Assistance  Bathing, Feeding, Dressing Bathing Assistance: Maximum assistance Feeding assistance: Limited assistance Dressing Assistance: Maximum assistance     Functional Limitations Info  Sight, Hearing, Speech Sight Info: Adequate Hearing Info: Adequate Speech Info: Adequate    SPECIAL CARE FACTORS FREQUENCY                       Contractures Contractures Info: Not present    Additional Factors Info  Code Status, Allergies Code Status Info:  (Full) Allergies Info:  (Guaifenesin, Kiwi Extract, Levaquin (Levofloxacin In D5w), Strawberry Extract, Baclofen)           Current Medications (12/03/2021):  This is the current hospital active medication list Current Facility-Administered Medications  Medication Dose Route Frequency Provider Last Rate Last Admin   acetaminophen (TYLENOL) tablet 650 mg  650 mg Oral Q6H PRN Reubin Milan, MD   650 mg at 12/02/21 1449   Or   acetaminophen (TYLENOL) suppository 650 mg  650 mg Rectal Q6H PRN Reubin Milan, MD       albuterol (PROVENTIL) (2.5 MG/3ML) 0.083% nebulizer solution 2.5 mg  2.5 mg Nebulization  Q4H PRN Reubin Milan, MD   2.5 mg at 12/02/21 2304   albuterol (VENTOLIN HFA) 108 (90 Base) MCG/ACT inhaler 2 puff  2 puff Inhalation Q4H PRN Swayze, Ava, DO       apixaban (ELIQUIS) tablet 5 mg  5 mg Oral BID Reubin Milan, MD   5 mg at 12/03/21 1003   ascorbic acid (VITAMIN C) tablet 500 mg  500 mg Oral Daily Reubin Milan, MD   500 mg at 12/03/21 1004   aspirin EC tablet 81 mg  81 mg Oral Daily Reubin Milan, MD   81 mg at 12/03/21 1004   atorvastatin (LIPITOR) tablet 80 mg  80 mg Oral QPM Reubin Milan, MD   80 mg at 12/02/21 1805   ceFEPIme (MAXIPIME) 2 g in sodium chloride 0.9 % 100 mL IVPB  2 g Intravenous Q8H Reubin Milan, MD 200 mL/hr at 12/03/21 [id number] 2 g at 12/03/21 [id number]   cyclobenzaprine (FLEXERIL) tablet 10 mg  10 mg Oral TID PRN Reubin Milan, MD       fluticasone Asencion Islam) 50 MCG/ACT nasal spray 1 spray  1 spray Each Nare Daily Reubin Milan, MD   1 spray at [id number]   folic acid (FOLVITE) tablet 1 mg  1 mg Oral Daily Reubin Milan, MD   1 mg at 12/03/21 1009   gabapentin (NEURONTIN) capsule 300 mg  300 mg Oral TID Reubin Milan, MD   300 mg at 12/03/21 1003   guaiFENesin ([place]) 12 hr tablet 600 mg  600 mg Oral BID Reubin Milan, MD       insulin aspart (novoLOG) injection 0-20 Units  0-20 Units Subcutaneous TID WC Reubin Milan, MD   15 Units at 12/03/21 1222   insulin glargine-yfgn (SEMGLEE) injection 50 Units  50 Units Subcutaneous QHS Reubin Milan, MD   50 Units at 12/02/21 2110   ipratropium-albuterol (DUONEB) 0.5-2.5 (3) MG/3ML nebulizer solution 3 mL  3 mL Nebulization TID Reubin Milan, MD   3 mL at 12/03/21 [id number]   lactated ringers infusion   Intravenous Continuous Reubin Milan, MD 100 mL/hr at 12/03/21 0556 New Bag at 12/03/21 0556   levothyroxine (SYNTHROID) tablet 75 mcg  75 mcg Oral Q0600 Reubin Milan, MD   75 mcg at 12/03/21 0500   lidocaine (LIDODERM) 5 % 1 patch  1 patch Transdermal Q24H Reubin Milan, MD   1 patch at 12/02/21 1806   linagliptin (TRADJENTA) tablet 5 mg  5 mg Oral Daily Reubin Milan, MD   5 mg at 12/03/21 1003   losartan (COZAAR) tablet 100 mg  100 mg Oral Daily Reubin Milan, MD   100 mg at 12/03/21 1003   metFORMIN (GLUCOPHAGE-XR) 24 hr tablet 500 mg  500 mg Oral Q breakfast Reubin Milan, MD   500 mg at 12/03/21 1004   metoprolol tartrate  (LOPRESSOR) tablet 25 mg  25 mg Oral BID Reubin Milan, MD   25 mg at 12/03/21 1003   mometasone-formoterol (DULERA) 100-5 MCG/ACT inhaler 2 puff  2 puff Inhalation BID Reubin Milan, MD   2 puff at 12/03/21 [id number]   omega-3 acid ethyl esters (LOVAZA) capsule 1 g  1 g Oral BID Reubin Milan, MD   1 g at 12/03/21 1003   ondansetron [facility]) tablet 4 mg  4 mg Oral Q6H PRN Reubin Milan, MD  Or   ondansetron (ZOFRAN) injection 4 mg  4 mg Intravenous Q6H PRN Reubin Milan, MD       pantoprazole (PROTONIX) EC tablet 40 mg  40 mg Oral Daily Reubin Milan, MD   40 mg at 12/03/21 1003   vancomycin (VANCOCIN) IVPB 1000 mg/200 mL premix  1,000 mg Intravenous Q24H Reubin Milan, MD 200 mL/hr at 12/03/21 1008 1,000 mg at 12/03/21 1008   vitamin B-12 (CYANOCOBALAMIN) tablet 500 mcg  500 mcg Oral Daily Reubin Milan, MD   500 mcg at 12/03/21 1004     Discharge Medications: Please see discharge summary for a list of discharge medications.  Relevant Imaging Results:  Relevant Lab Results:   Additional Information SS#[id number], Juliann Pulse, [place]

## 2021-12-03 NOTE — Assessment & Plan Note (Addendum)
11/15/2019-CTA chest-positive for acute PE with CT evidence of right heart strain consistent with at least submassive PE  11/16/2019-echocardiogram-LV ejection fraction 65 to 70%, right ventricle systolic function is severely reduced, right ventricular systolic pressure is 37.2  Continue Eliquis on discharge.

## 2021-12-07 LAB — CULTURE, BLOOD (ROUTINE X 2)
Culture: NO GROWTH
Culture: NO GROWTH
Special Requests: ADEQUATE
Special Requests: ADEQUATE

## 2021-12-12 ENCOUNTER — Ambulatory Visit (INDEPENDENT_AMBULATORY_CARE_PROVIDER_SITE_OTHER): Payer: Medicaid Other | Admitting: Podiatry

## 2021-12-12 ENCOUNTER — Other Ambulatory Visit: Payer: Self-pay

## 2021-12-12 DIAGNOSIS — E119 Type 2 diabetes mellitus without complications: Secondary | ICD-10-CM

## 2021-12-12 DIAGNOSIS — I999 Unspecified disorder of circulatory system: Secondary | ICD-10-CM | POA: Diagnosis not present

## 2021-12-12 NOTE — Progress Notes (Signed)
Subjective: Karla Hoover presents today referred by Patient, [name] for diabetic foot evaluation.  Patient relates 14 year history of diabetes.  Patient denies any history of foot wounds.  Patient occasional history of numbness, tingling, burning, pins/needles sensations.  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   Former smoker 36 pack year smoking history     Diabetes mellitus without complication ([place])    Essential hypertension 05/04/2009   Qualifier: Diagnosis of  By: Quentin Cornwall CMA, Jessica     GERD without esophagitis 02/09/2021   Grade I diastolic dysfunction [id number]   Hypertension    Hypothyroidism 02/09/2021   Mixed 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 ([place]) 02/09/2021   Pulmonary embolism ([place]) 11/16/2019   Stroke ([place])    Type 2 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 disease, with long-term current use of insulin ([place]) 02/09/2021    Patient Active Problem List   Diagnosis Date Noted   Sepsis due to undetermined organism ([place]) 12/02/2021   Grade I diastolic dysfunction [id number]   Mild protein-calorie malnutrition ([place]) 12/02/2021   COPD with acute exacerbation ([place]) 12/02/2021   Chest pain 05/29/2021   Acute renal failure superimposed on stage 3a chronic kidney disease ([place]) 02/09/2021   Chronic respiratory failure with hypoxia ([place]) 02/09/2021   History of thromboembolism 02/09/2021   Type 2 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 disease, with long-term current use of insulin ([place]) 02/09/2021   Mixed 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 ([place]) 02/09/2021   GERD without esophagitis 02/09/2021   Hypothyroidism 02/09/2021   Pyelonephritis of right kidney 02/09/2021    Sepsis secondary to UTI ([place]) 02/08/2021   Lymphadenopathy 12/28/2020   VTE (venous thromboembolism) 12/01/2019   Overweight (BMI 25.0-29.9) 11/22/2019   Chronic constipation 11/22/2019   Tobacco abuse 11/16/2019   Elevated troponin 11/16/2019   Bilateral pulmonary embolism ([place]) 11/15/2019   DM (diabetes mellitus), type 2 ([place]) 11/15/2019   History of stroke 11/15/2019   Essential hypertension 05/04/2009   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  Current Outpatient Medications on File Prior to Visit  Medication Sig Dispense Refill   acetaminophen (TYLENOL) 500 MG tablet Take 1,000 mg by mouth in the morning, at noon, and at bedtime.     albuterol (VENTOLIN HFA) 108 (90 Base) MCG/ACT inhaler Inhale 2 puffs into the lungs every 6 (six) hours as needed for wheezing or shortness of breath. (Patient taking differently: Inhale 2 puffs into the lungs every 6 (six) hours as needed for wheezing or shortness of breath ("COPD").) 8 g 1   Alum & Mag Hydroxide-Simeth (MAALOX PLUS PO) Take 30 mLs by mouth every 6 (six) hours as needed (heartburn and/or indigestion). Maalox Plus Suspension 225-200-25mg /7mL     apixaban (ELIQUIS) 5 MG TABS tablet Take 2 tablets (10mg ) twice daily for 7 days, then 1 tablet (5mg ) twice daily (Patient taking differently: Take 5 mg by mouth 2 (two) times daily.) 60 tablet 0   ascorbic acid (VITAMIN C) 500 MG tablet Take 500 mg by mouth daily.     aspirin 81 MG chewable tablet Chew 81 mg by mouth in the morning.     atorvastatin (LIPITOR) 80 MG tablet Take 1  tablet (80 mg total) by mouth daily. (Patient taking differently: Take 80 mg by mouth every evening.) 30 tablet 5   chlorhexidine (PERIDEX) 0.12 % solution Use as directed 15 mLs in the mouth or throat daily.     cyclobenzaprine (FLEXERIL) 10 MG tablet Take 1 tablet (10 mg total) by mouth 3 (three) times daily as needed for muscle spasms. (Patient taking differently: Take 10 mg by  mouth in the morning, at noon, and at bedtime.) 30 tablet 5   fluticasone (FLONASE) 50 MCG/ACT nasal spray Place 1 spray into both nostrils in the morning and at bedtime. For uri; written to use for 10 days. Began [id number]     folic acid (FOLVITE) Q000111Q MCG tablet Take 800 mcg by mouth daily.     gabapentin (NEURONTIN) 300 MG capsule Take 1 capsule (300 mg total) by mouth 3 (three) times daily. 90 capsule 5   guaiFENesin (MUCINEX) 600 MG 12 hr tablet Take 600 mg by mouth 2 (two) times daily. Pt is to take for 1 week. Began 11/26/21     HYDROCORTISONE, TOPICAL, 1 % SOLN Apply 1 application topically 2 (two) times daily as needed (psoriasis). Apply to scalp, limb rash topically as needed for psoriasis bid prn.     insulin glargine (LANTUS) 100 UNIT/ML injection Inject 50 Units into the skin daily.     insulin lispro (HUMALOG) 100 UNIT/ML injection Inject 0-14 Units into the skin in the morning and at bedtime. Per sliding scale: 0-200=0/no insulin; 201-250=2 units; 251-300=4 units; 301-350=6 units; 351-400=8 units: 401-450= 10 units; 451-500=12 units/ if BS reads high, inject 14 units of either Humalog or Novolog.     ipratropium-albuterol (DUONEB) 0.5-2.5 (3) MG/3ML SOLN Inhale 1 vial via nebulizer 3 (three) times daily. 360 mL 0   levothyroxine (SYNTHROID) 75 MCG tablet Take 75 mcg by mouth daily before breakfast.     lidocaine (LIDODERM) 5 % Place 1 patch onto the skin daily. Remove & Discard patch within 12 hours or as directed by MD (Patient taking differently: Place 1 patch onto the skin daily. Apply to upper shoulder/midback topically one time a day per schedule. Remove & Discard patch within 12 hours or as directed by MD (Apply 0900, take off at 2100)) 30 patch 0   linagliptin (TRADJENTA) 5 MG TABS tablet Take 5 mg by mouth daily.     loperamide (IMODIUM A-D) 2 MG tablet Take 2-4 mg by mouth See admin instructions. May give 4mg  after loose stool, then 2mg  after each additional loose stool. Do not  exceed 8mg  in 24 hours.     losartan (COZAAR) 100 MG tablet Take 100 mg by mouth daily.     Menthol (BIOFREEZE) 5 % PTCH Apply 1 patch topically daily. Apply to posterior neck one time a day, and take off qhs     metFORMIN (GLUCOPHAGE-XR) 500 MG 24 hr tablet Take 500 mg by mouth daily with breakfast.     metoprolol tartrate (LOPRESSOR) 25 MG tablet Take 1 tablet (25 mg total) by mouth 2 (two) times daily. 60 tablet 5   Omega-3 Fatty Acids (FISH OIL) 1000 MG CAPS Take 1,000 mg by mouth in the morning and at bedtime.     omeprazole (PRILOSEC) 20 MG capsule Take 20 mg by mouth daily.     predniSONE (DELTASONE) 20 MG tablet Take 2 tablets (40 mg total) by mouth daily for 3 days, 1&1/2 tablets (30 mg total) daily for 3 days, 1 tablet (20 mg total) daily for 3  days, then 1/2 tablet (10 mg total) daily for 3 days. 15 tablet 0   SYMBICORT 80-4.5 MCG/ACT inhaler Inhale 2 puffs into the lungs 2 (two) times daily.     vitamin B-12 (CYANOCOBALAMIN) 500 MCG tablet Take 500 mcg by mouth daily.     No current facility-administered medications on file prior to visit.     Allergies  Allergen Reactions   Guaifenesin Anaphylaxis   Kiwi Extract Anaphylaxis and Rash   Levaquin [Levofloxacin In D5w] Shortness Of Breath and Rash   Strawberry Extract Anaphylaxis and Rash   Baclofen Other (See Comments)    Near syncope/ fall    Social History   Occupational History   Not on file  Tobacco Use   Smoking status: Former    Packs/day: 1.75    Years: 36.00    Pack years: 63.00    Types: Cigarettes    Start date: 10/08/1983    Quit date: 11/22/2019    Years since quitting: 2.0   Smokeless tobacco: Never  Vaping Use   Vaping Use: Never used  Substance and Sexual Activity   Alcohol use: No   Drug use: No   Sexual activity: Not on file    Family History  Problem Relation Age of Onset   Diabetes Mother    COPD Sister    Heart failure Sister     Immunization History  Administered Date(s) Administered    Influenza,inj,Quad PF,6+ Mos 07/08/2019   PPD Test 07/21/2019, 01/09/2021   Tdap 10/04/2019    Review of systems: Positive Findings in bold print.  Constitutional:  chills, fatigue, fever, sweats, weight change Communication: Optometrist, sign Ecologist, hand writing, iPad/Android device Head: headaches, head injury Eyes: changes in vision, eye pain, glaucoma, cataracts, macular degeneration, diplopia, glare,  light sensitivity, eyeglasses or contacts, blindness Ears nose mouth throat: hearing impaired, hearing aids,  ringing in ears, deaf, sign language,  vertigo, nosebleeds,  rhinitis,  cold sores, snoring, swollen glands Cardiovascular: HTN, edema, arrhythmia, pacemaker in place, defibrillator in place, chest pain/tightness, chronic anticoagulation, blood clot, heart failure, MI Peripheral Vascular: leg cramps, varicose veins, blood clots, lymphedema, varicosities Respiratory:  asthma, difficulty breathing, denies congestion, SOB, wheezing, cough, emphysema Gastrointestinal: change in appetite or weight, abdominal pain, constipation, diarrhea, nausea, vomiting, vomiting blood, change in bowel habits, abdominal pain, jaundice, rectal bleeding, hemorrhoids, GERD Genitourinary:  nocturia,  pain on urination, polyuria,  blood in urine, Foley catheter, urinary urgency, ESRD on hemodialysis Musculoskeletal: amputation, cramping, stiff joints, painful joints, decreased joint motion, fractures, OA, gout, hemiplegia, paraplegia, uses cane, wheelchair bound, uses walker, uses rollator Skin: +changes in toenails, color change, dryness, itching, mole changes,  rash, wound(s) Neurological: headaches, numbness in feet, paresthesias in feet, burning in feet, fainting,  seizures, change in speech, migraines, memory problems/poor historian, cerebral palsy, weakness, paralysis, CVA, TIA Endocrine: diabetes, hypothyroidism, hyperthyroidism,  goiter, dry mouth, flushing, heat intolerance, cold  intolerance,  excessive thirst, denies polyuria,  nocturia Hematological:  easy bleeding, excessive bleeding, easy bruising, enlarged lymph nodes, on long term blood thinner, history of past transusions Allergy/immunological:  hives, eczema, frequent infections, multiple drug allergies, seasonal allergies, transplant recipient, multiple food allergies Psychiatric:  anxiety, depression, mood disorder, suicidal ideations, hallucinations, insomnia  Objective: There were no vitals filed for this visit. Vascular Examination: Capillary refill time less than 3 seconds x 10 digits.  Dorsalis pedis pulses faintly palpable 2 out of 4.  Posterior tibial pulses nonpalpable  Digital hair diminished x 10 digits.  Skin temperature gradient WNL  b/l.  Dermatological Examination: Skin with normal turgor, texture and tone b/l  Toenails 1-5 b/l discolored, thick, dystrophic with subungual debris and pain with palpation to nailbeds due to thickness of nails.  Musculoskeletal: Muscle strength 5/5 to all LE muscle groups.  Patient experiences charley horse type of symptoms/claudication type of symptoms  Neurological: Sensation intact with 10 gram monofilament.  Vibratory sensation intact.  Assessment: NIDDM Encounter for diabetic foot examination Vascular abnormality  Plan: Discussed diabetic foot care principles. Literature dispensed on today. Patient to continue soft, supportive shoe gear daily. Patient to report any pedal injuries to medical professional immediately. Follow up one year. Patient/POA to call should there be a concern in the interim. I explained the patient the etiology of vascular abnormality given that she is an uncontrolled diabetic for a long time she may have circulation issues as well.  I will order ABIs PVRs to assess the vascular flow to lower extremity and if there is an issue I will send her over to vascular doctor as patient does have claudication type of symptoms.

## 2021-12-12 NOTE — Addendum Note (Signed)
Addended by: Boneta Lucks on: 12/12/2021 01:38 PM ? ? Modules accepted: Orders ? ?

## 2021-12-23 ENCOUNTER — Encounter (HOSPITAL_COMMUNITY): Payer: Self-pay

## 2021-12-23 ENCOUNTER — Other Ambulatory Visit: Payer: Self-pay

## 2021-12-23 ENCOUNTER — Emergency Department (HOSPITAL_COMMUNITY): Payer: Medicaid Other

## 2021-12-23 ENCOUNTER — Emergency Department (HOSPITAL_COMMUNITY)
Admission: EM | Admit: 2021-12-23 | Discharge: 2021-12-24 | Disposition: A | Discharge status: Home / Self Care | Payer: Medicaid Other | Attending: Emergency Medicine | Admitting: Emergency Medicine

## 2021-12-23 DIAGNOSIS — Z794 Long term (current) use of insulin: Secondary | ICD-10-CM | POA: Diagnosis not present

## 2021-12-23 DIAGNOSIS — R1084 Generalized abdominal pain: Secondary | ICD-10-CM | POA: Diagnosis present

## 2021-12-23 DIAGNOSIS — R000 Tachycardia, unspecified: Secondary | ICD-10-CM | POA: Insufficient documentation

## 2021-12-23 DIAGNOSIS — Z7982 Long term (current) use of aspirin: Secondary | ICD-10-CM | POA: Insufficient documentation

## 2021-12-23 DIAGNOSIS — Z20822 Contact with and (suspected) exposure to covid-19: Secondary | ICD-10-CM | POA: Diagnosis not present

## 2021-12-23 DIAGNOSIS — R109 Unspecified abdominal pain: Secondary | ICD-10-CM

## 2021-12-23 DIAGNOSIS — R110 Nausea: Secondary | ICD-10-CM | POA: Diagnosis not present

## 2021-12-23 DIAGNOSIS — Z7901 Long term (current) use of anticoagulants: Secondary | ICD-10-CM | POA: Insufficient documentation

## 2021-12-23 LAB — PROTIME-INR
INR: 1.2 (ref 0.8–1.2)
Prothrombin Time: 15.7 seconds — ABNORMAL HIGH (ref 11.4–15.2)

## 2021-12-23 LAB — URINALYSIS, MICROSCOPIC (REFLEX): Squamous Epithelial / HPF: NONE SEEN (ref 0–5)

## 2021-12-23 LAB — CBC WITH DIFFERENTIAL/PLATELET
Abs Immature Granulocytes: 0.05 10*3/uL (ref 0.00–0.07)
Basophils Absolute: 0 10*3/uL (ref 0.0–0.1)
Basophils Relative: 0 %
Eosinophils Absolute: 0 10*3/uL (ref 0.0–0.5)
Eosinophils Relative: 0 %
HCT: 39.6 % (ref 36.0–46.0)
Hemoglobin: 12.7 g/dL (ref 12.0–15.0)
Immature Granulocytes: 0 %
Lymphocytes Relative: 18 %
Lymphs Abs: 2.2 10*3/uL (ref 0.7–4.0)
MCH: 27.9 pg (ref 26.0–34.0)
MCHC: 32.1 g/dL (ref 30.0–36.0)
MCV: 87 fL (ref 80.0–100.0)
Monocytes Absolute: 0.9 10*3/uL (ref 0.1–1.0)
Monocytes Relative: 7 %
Neutro Abs: 9.3 10*3/uL — ABNORMAL HIGH (ref 1.7–7.7)
Neutrophils Relative %: 75 %
Platelets: 290 10*3/uL (ref 150–400)
RBC: 4.55 MIL/uL (ref 3.87–5.11)
RDW: 13.8 % (ref 11.5–15.5)
WBC: 12.5 10*3/uL — ABNORMAL HIGH (ref 4.0–10.5)
nRBC: 0 % (ref 0.0–0.2)

## 2021-12-23 LAB — URINALYSIS, ROUTINE W REFLEX MICROSCOPIC
Bilirubin Urine: NEGATIVE
Glucose, UA: NEGATIVE mg/dL
Ketones, ur: NEGATIVE mg/dL
Leukocytes,Ua: NEGATIVE
Nitrite: NEGATIVE
Protein, ur: NEGATIVE mg/dL
Specific Gravity, Urine: <1.005 — ABNORMAL LOW (ref 1.005–1.030)
pH: 7 (ref 5.0–8.0)

## 2021-12-23 LAB — LACTIC ACID, PLASMA: Lactic Acid, Venous: 1.3 mmol/L (ref 0.5–1.9)

## 2021-12-23 LAB — COMPREHENSIVE METABOLIC PANEL
ALT: 16 U/L (ref 0–44)
AST: 16 U/L (ref 15–41)
Albumin: 3.6 g/dL (ref 3.5–5.0)
Alkaline Phosphatase: 96 U/L (ref 38–126)
Anion gap: 8 (ref 5–15)
BUN: 15 mg/dL (ref 6–20)
CO2: 31 mmol/L (ref 22–32)
Calcium: 9.5 mg/dL (ref 8.9–10.3)
Chloride: 95 mmol/L — ABNORMAL LOW (ref 98–111)
Creatinine, Ser: 0.88 mg/dL (ref 0.44–1.00)
GFR, Estimated: >60 mL/min (ref >60)
Glucose, Bld: 177 mg/dL — ABNORMAL HIGH (ref 70–99)
Potassium: 4.4 mmol/L (ref 3.5–5.1)
Sodium: 134 mmol/L — ABNORMAL LOW (ref 135–145)
Total Bilirubin: 0.5 mg/dL (ref 0.3–1.2)
Total Protein: 7.9 g/dL (ref 6.5–8.1)

## 2021-12-23 LAB — RESP PANEL BY RT-PCR (FLU A&B, COVID) ARPGX2
Influenza A by PCR: NEGATIVE
Influenza B by PCR: NEGATIVE
SARS Coronavirus 2 by RT PCR: NEGATIVE

## 2021-12-23 LAB — HCG, QUANTITATIVE, PREGNANCY: hCG, Beta Chain, Quant, S: 2 m[IU]/mL (ref <5)

## 2021-12-23 MED ORDER — MORPHINE SULFATE (PF) 4 MG/ML IV SOLN
4.0000 mg | Freq: Once | INTRAVENOUS | Status: AC
  Administered 2021-12-23: 4 mg via INTRAVENOUS
  Filled 2021-12-23: qty 1

## 2021-12-23 MED ORDER — IOHEXOL 300 MG/ML  SOLN
100.0000 mL | Freq: Once | INTRAMUSCULAR | Status: AC | PRN
  Administered 2021-12-23: 100 mL via INTRAVENOUS

## 2021-12-23 MED ORDER — METOPROLOL TARTRATE 25 MG PO TABS
25.0000 mg | ORAL_TABLET | Freq: Once | ORAL | Status: AC
  Administered 2021-12-23: 25 mg via ORAL
  Filled 2021-12-23: qty 1

## 2021-12-23 MED ORDER — SODIUM CHLORIDE 0.9 % IV BOLUS
1000.0000 mL | Freq: Once | INTRAVENOUS | Status: AC
  Administered 2021-12-23: 1000 mL via INTRAVENOUS

## 2021-12-23 MED ORDER — ONDANSETRON HCL 4 MG/2ML IJ SOLN
4.0000 mg | Freq: Once | INTRAMUSCULAR | Status: AC
  Administered 2021-12-23: 4 mg via INTRAVENOUS
  Filled 2021-12-23: qty 2

## 2021-12-23 NOTE — ED Triage Notes (Signed)
Pt BIB [facility] from [facility] for recurrent UTIs. Pt has had R sided abdominal pain since last night radiating to LUQ. Pt has history of urosepsis. Has not had any meds today r/t decreased appetite.  ?156/94 ?HR 126 ?93% RA ?201 CBG ?

## 2021-12-23 NOTE — ED Notes (Signed)
Patient desatting to 82%. Placed on 2L O2 via [place]. Saturations increased to 94% ?

## 2021-12-23 NOTE — ED Provider Notes (Addendum)
?Karla Hoover ?Provider Note ? ? ?CSN: [id number] ?Arrival date & time: 12/23/21  1537 ? ?  ? ?History ? ?Chief Complaint  ?Patient presents with  ? Abdominal Pain  ? ? ?Karla Hoover is a 51 y.o. female. ? ?51 year old female with prior medical history as detailed below presents for evaluation.  Patient complains of diffuse bilateral flank discomfort.  This began last night.  Patient with prior history of UTIs and significant infection related to UTIs.  Patient complains of associated nausea.  She denies fever. ? ?She denies current urinary symptoms such as dysuria, increased frequency, etc. ? ?The history is provided by the patient and medical records.  ?Abdominal Pain ?Pain location:  Generalized ?Pain quality: aching and cramping   ?Pain radiates to:  Does not radiate ?Pain severity:  Mild ?Onset quality:  Gradual ?Duration:  1 day ?Timing:  Constant ?Progression:  Waxing and waning ? ?  ? ?Home Medications ?Prior to Admission medications   ?Medication Sig Start Date End Date Taking? Authorizing Provider  ?acetaminophen (TYLENOL) 500 MG tablet Take 1,000 mg by mouth in the morning, at noon, and at bedtime.    [provider]  ?albuterol (VENTOLIN HFA) 108 (90 Base) MCG/ACT inhaler Inhale 2 puffs into the lungs every 6 (six) hours as needed for wheezing or shortness of breath. ?Patient taking differently: Inhale 2 puffs into the lungs every 6 (six) hours as needed for wheezing or shortness of breath ("COPD"). 10/04/19   Grayce Sessions, NP  ?Alum & Mag Hydroxide-Simeth (MAALOX PLUS PO) Take 30 mLs by mouth every 6 (six) hours as needed (heartburn and/or indigestion). Maalox Plus Suspension 225-200-25mg /83mL    [provider]  ?apixaban (ELIQUIS) 5 MG TABS tablet Take 2 tablets (10mg ) twice daily for 7 days, then 1 tablet (5mg ) twice daily ?Patient taking differently: Take 5 mg by mouth 2 (two) times daily. 11/21/19   , DO  ?ascorbic acid  (VITAMIN C) 500 MG tablet Take 500 mg by mouth daily.    [provider]  ?aspirin 81 MG chewable tablet Chew 81 mg by mouth in the morning.    [provider]  ?atorvastatin (LIPITOR) 80 MG tablet Take 1 tablet (80 mg total) by mouth daily. ?Patient taking differently: Take 80 mg by mouth every evening. 10/04/19   Darlin Drop, NP  ?chlorhexidine (PERIDEX) 0.12 % solution Use as directed 15 mLs in the mouth or throat daily.    [provider]  ?cyclobenzaprine (FLEXERIL) 10 MG tablet Take 1 tablet (10 mg total) by mouth 3 (three) times daily as needed for muscle spasms. ?Patient taking differently: Take 10 mg by mouth in the morning, at noon, and at bedtime. 10/04/19   Grayce Sessions, NP  ?fluticasone (FLONASE) 50 MCG/ACT nasal spray Place 1 spray into both nostrils in the morning and at bedtime. For uri; written to use for 10 days. Began 11/27/21    [provider]  ?folic acid (FOLVITE) 800 MCG tablet Take 800 mcg by mouth daily.    [provider]  ?gabapentin (NEURONTIN) 300 MG capsule Take 1 capsule (300 mg total) by mouth 3 (three) times daily. 11/11/19 12/02/21  01/09/20, NP  ?guaiFENesin (MUCINEX) 600 MG 12 hr tablet Take 600 mg by mouth 2 (two) times daily. Pt is to take for 1 week. Began 11/26/21    [provider]  ?HYDROCORTISONE, TOPICAL, 1 % SOLN Apply 1 application topically 2 (two) times daily  as needed (psoriasis). Apply to scalp, limb rash topically as needed for psoriasis bid prn.    [provider]  ?insulin glargine (LANTUS) 100 UNIT/ML injection Inject 50 Units into the skin daily.    [provider]  ?insulin lispro (HUMALOG) 100 UNIT/ML injection Inject 0-14 Units into the skin in the morning and at bedtime. Per sliding scale: 0-200=0/no insulin; 201-250=2 units; 251-300=4 units; 301-350=6 units; 351-400=8 units: 401-450= 10 units; 451-500=12 units/ if BS reads high, inject 14 units of either Humalog  or Novolog.    [provider]  ?ipratropium-albuterol (DUONEB) 0.5-2.5 (3) MG/3ML SOLN Inhale 1 vial via nebulizer 3 (three) times daily. 12/03/21   Swayze, Ava, DO  ?levothyroxine (SYNTHROID) 75 MCG tablet Take 75 mcg by mouth daily before breakfast.    [provider]  ?lidocaine (LIDODERM) 5 % Place 1 patch onto the skin daily. Remove & Discard patch within 12 hours or as directed by MD ?Patient taking differently: Place 1 patch onto the skin daily. Apply to upper shoulder/midback topically one time a day per schedule. Remove & Discard patch within 12 hours or as directed by MD (Apply 0900, take off at 2100) 04/01/21   Arby BarrettePfeiffer, Marcy, MD  ?linagliptin (TRADJENTA) 5 MG TABS tablet Take 5 mg by mouth daily.    [provider]  ?loperamide (IMODIUM A-D) 2 MG tablet Take 2-4 mg by mouth See admin instructions. May give 4mg  after loose stool, then 2mg  after each additional loose stool. Do not exceed 8mg  in 24 hours.    [provider]  ?losartan (COZAAR) 100 MG tablet Take 100 mg by mouth daily.    [provider]  ?Menthol (BIOFREEZE) 5 % PTCH Apply 1 patch topically daily. Apply to posterior neck one time a day, and take off qhs    [provider]  ?metFORMIN (GLUCOPHAGE-XR) 500 MG 24 hr tablet Take 500 mg by mouth daily with breakfast.    [provider]  ?metoprolol tartrate (LOPRESSOR) 25 MG tablet Take 1 tablet (25 mg total) by mouth 2 (two) times daily. 10/04/19   Grayce SessionsEdwards, Michelle P, NP  ?Omega-3 Fatty Acids (FISH OIL) 1000 MG CAPS Take 1,000 mg by mouth in the morning and at bedtime.    [provider]  ?omeprazole (PRILOSEC) 20 MG capsule Take 20 mg by mouth daily.    [provider]  ?SYMBICORT 80-4.5 MCG/ACT inhaler Inhale 2 puffs into the lungs 2 (two) times daily.    [provider]  ?vitamin B-12 (CYANOCOBALAMIN) 500 MCG tablet Take 500 mcg by mouth daily.    [provider]  ?   ? ?Allergies     ?Guaifenesin, Kiwi extract, Levaquin [levofloxacin in d5w], Strawberry extract, and Baclofen   ? ?Review of Systems   ?Review of Systems  ?Gastrointestinal:  Positive for abdominal pain.  ?All other systems reviewed and are negative. ? ?Physical Exam ?Updated Vital Signs ?BP (!) 170/99   Pulse (!) 131   Temp 99.6 ?F (37.6 ?C) (Oral)   Resp 20   Ht 5' 6.5" (1.689 m)   Wt 91.2 kg   LMP 10/20/2019   SpO2 91%   BMI 31.96 kg/m?  ?Physical Exam ?Vitals and nursing note reviewed.  ?Constitutional:   ?   General: She is not in acute distress. ?   Appearance: Normal appearance. She is well-developed.  ?HENT:  ?   Head: Normocephalic and atraumatic.  ?Eyes:  ?   Conjunctiva/sclera: Conjunctivae normal.  ?   Pupils:  Pupils are equal, round, and reactive to light.  ?Cardiovascular:  ?   Rate and Rhythm: Normal rate and regular rhythm.  ?   Heart sounds: Normal heart sounds.  ?Pulmonary:  ?   Effort: Pulmonary effort is normal. No respiratory distress.  ?   Breath sounds: Normal breath sounds.  ?Abdominal:  ?   General: There is no distension.  ?   Palpations: Abdomen is soft.  ?   Tenderness: There is no abdominal tenderness.  ?Musculoskeletal:     ?   General: No deformity. Normal range of motion.  ?   Cervical back: Normal range of motion and neck supple.  ?Skin: ?   General: Skin is warm and dry.  ?Neurological:  ?   Mental Status: She is alert and oriented to person, place, and time.  ?   Comments: Alert, oriented x4, no acute focal deficit  ? ? ?ED Results / Procedures / Treatments   ?Labs ?(all labs ordered are listed, but only abnormal results are displayed) ?Labs Reviewed  ?COMPREHENSIVE METABOLIC PANEL - Abnormal; Notable for the following components:  ?    Result Value  ? Sodium 134 (*)   ? Chloride 95 (*)   ? Glucose, Bld 177 (*)   ? All other components within normal limits  ?CBC WITH DIFFERENTIAL/PLATELET - Abnormal; Notable for the following components:  ? WBC 12.5 (*)   ? Neutro Abs 9.3 (*)   ? All other  components within normal limits  ?PROTIME-INR - Abnormal; Notable for the following components:  ? Prothrombin Time 15.7 (*)   ? All other components within normal limits  ?CULTURE, BLOOD (ROUTINE X 2)  ?CULTURE, BLOOD (ROU

## 2021-12-23 NOTE — Discharge Instructions (Signed)
Return for any problem.  ?

## 2021-12-25 LAB — URINE CULTURE: Culture: <10000 — AB

## 2021-12-28 LAB — CULTURE, BLOOD (ROUTINE X 2)
Culture: NO GROWTH
Special Requests: ADEQUATE

## 2022-01-01 ENCOUNTER — Other Ambulatory Visit: Payer: Self-pay

## 2022-01-01 ENCOUNTER — Emergency Department (HOSPITAL_COMMUNITY): Payer: Medicaid Other

## 2022-01-01 ENCOUNTER — Encounter (HOSPITAL_COMMUNITY): Payer: Self-pay | Admitting: Emergency Medicine

## 2022-01-01 ENCOUNTER — Inpatient Hospital Stay (HOSPITAL_COMMUNITY)
Admission: EM | Admit: 2022-01-01 | Discharge: 2022-01-06 | DRG: 872 | Disposition: A | Discharge status: Skilled Nursing Facility | Payer: Medicaid Other | Attending: Family Medicine | Admitting: Family Medicine

## 2022-01-01 DIAGNOSIS — K219 Gastro-esophageal reflux disease without esophagitis: Secondary | ICD-10-CM | POA: Diagnosis present

## 2022-01-01 DIAGNOSIS — A419 Sepsis, unspecified organism: Principal | ICD-10-CM | POA: Diagnosis present

## 2022-01-01 DIAGNOSIS — Z825 Family history of asthma and other chronic lower respiratory diseases: Secondary | ICD-10-CM

## 2022-01-01 DIAGNOSIS — I5189 Other ill-defined heart diseases: Secondary | ICD-10-CM

## 2022-01-01 DIAGNOSIS — Z888 Allergy status to other drugs, medicaments and biological substances status: Secondary | ICD-10-CM

## 2022-01-01 DIAGNOSIS — E1122 Type 2 diabetes mellitus with diabetic chronic kidney disease: Secondary | ICD-10-CM | POA: Diagnosis present

## 2022-01-01 DIAGNOSIS — E039 Hypothyroidism, unspecified: Secondary | ICD-10-CM | POA: Diagnosis present

## 2022-01-01 DIAGNOSIS — Z7984 Long term (current) use of oral hypoglycemic drugs: Secondary | ICD-10-CM

## 2022-01-01 DIAGNOSIS — Z833 Family history of diabetes mellitus: Secondary | ICD-10-CM

## 2022-01-01 DIAGNOSIS — N10 Acute tubulo-interstitial nephritis: Secondary | ICD-10-CM | POA: Diagnosis present

## 2022-01-01 DIAGNOSIS — N390 Urinary tract infection, site not specified: Secondary | ICD-10-CM | POA: Diagnosis present

## 2022-01-01 DIAGNOSIS — R6520 Severe sepsis without septic shock: Secondary | ICD-10-CM | POA: Diagnosis present

## 2022-01-01 DIAGNOSIS — E669 Obesity, unspecified: Secondary | ICD-10-CM | POA: Diagnosis present

## 2022-01-01 DIAGNOSIS — T7840XA Allergy, unspecified, initial encounter: Secondary | ICD-10-CM

## 2022-01-01 DIAGNOSIS — J441 Chronic obstructive pulmonary disease with (acute) exacerbation: Secondary | ICD-10-CM | POA: Diagnosis present

## 2022-01-01 DIAGNOSIS — E1165 Type 2 diabetes mellitus with hyperglycemia: Secondary | ICD-10-CM | POA: Diagnosis present

## 2022-01-01 DIAGNOSIS — K5900 Constipation, unspecified: Secondary | ICD-10-CM | POA: Diagnosis not present

## 2022-01-01 DIAGNOSIS — J449 Chronic obstructive pulmonary disease, unspecified: Secondary | ICD-10-CM | POA: Diagnosis present

## 2022-01-01 DIAGNOSIS — Z881 Allergy status to other antibiotic agents status: Secondary | ICD-10-CM

## 2022-01-01 DIAGNOSIS — N12 Tubulo-interstitial nephritis, not specified as acute or chronic: Secondary | ICD-10-CM

## 2022-01-01 DIAGNOSIS — Z87891 Personal history of nicotine dependence: Secondary | ICD-10-CM

## 2022-01-01 DIAGNOSIS — N1831 Chronic kidney disease, stage 3a: Secondary | ICD-10-CM | POA: Diagnosis present

## 2022-01-01 DIAGNOSIS — Z1624 Resistance to multiple antibiotics: Secondary | ICD-10-CM | POA: Diagnosis present

## 2022-01-01 DIAGNOSIS — E871 Hypo-osmolality and hyponatremia: Secondary | ICD-10-CM | POA: Diagnosis present

## 2022-01-01 DIAGNOSIS — I69354 Hemiplegia and hemiparesis following cerebral infarction affecting left non-dominant side: Secondary | ICD-10-CM

## 2022-01-01 DIAGNOSIS — Z79890 Hormone replacement therapy (postmenopausal): Secondary | ICD-10-CM

## 2022-01-01 DIAGNOSIS — N179 Acute kidney failure, unspecified: Secondary | ICD-10-CM | POA: Diagnosis present

## 2022-01-01 DIAGNOSIS — Z20822 Contact with and (suspected) exposure to covid-19: Secondary | ICD-10-CM | POA: Diagnosis present

## 2022-01-01 DIAGNOSIS — Z86711 Personal history of pulmonary embolism: Secondary | ICD-10-CM

## 2022-01-01 DIAGNOSIS — Z91018 Allergy to other foods: Secondary | ICD-10-CM

## 2022-01-01 DIAGNOSIS — B9620 Unspecified Escherichia coli [E. coli] as the cause of diseases classified elsewhere: Secondary | ICD-10-CM | POA: Diagnosis present

## 2022-01-01 DIAGNOSIS — Z794 Long term (current) use of insulin: Secondary | ICD-10-CM

## 2022-01-01 DIAGNOSIS — Z7982 Long term (current) use of aspirin: Secondary | ICD-10-CM

## 2022-01-01 DIAGNOSIS — E782 Mixed hyperlipidemia: Secondary | ICD-10-CM | POA: Diagnosis present

## 2022-01-01 DIAGNOSIS — Z6831 Body mass index (BMI) 31.0-31.9, adult: Secondary | ICD-10-CM

## 2022-01-01 DIAGNOSIS — I129 Hypertensive chronic kidney disease with stage 1 through stage 4 chronic kidney disease, or unspecified chronic kidney disease: Secondary | ICD-10-CM | POA: Diagnosis present

## 2022-01-01 DIAGNOSIS — J4489 Other specified chronic obstructive pulmonary disease: Secondary | ICD-10-CM | POA: Diagnosis present

## 2022-01-01 DIAGNOSIS — Z79899 Other long term (current) drug therapy: Secondary | ICD-10-CM

## 2022-01-01 DIAGNOSIS — D631 Anemia in chronic kidney disease: Secondary | ICD-10-CM | POA: Diagnosis present

## 2022-01-01 DIAGNOSIS — I10 Essential (primary) hypertension: Secondary | ICD-10-CM | POA: Diagnosis present

## 2022-01-01 DIAGNOSIS — Z7951 Long term (current) use of inhaled steroids: Secondary | ICD-10-CM

## 2022-01-01 DIAGNOSIS — Z7901 Long term (current) use of anticoagulants: Secondary | ICD-10-CM

## 2022-01-01 DIAGNOSIS — Z86718 Personal history of other venous thrombosis and embolism: Secondary | ICD-10-CM

## 2022-01-01 LAB — COMPREHENSIVE METABOLIC PANEL
ALT: 17 U/L (ref 0–44)
AST: 20 U/L (ref 15–41)
Albumin: 3.1 g/dL — ABNORMAL LOW (ref 3.5–5.0)
Alkaline Phosphatase: 84 U/L (ref 38–126)
Anion gap: 10 (ref 5–15)
BUN: 23 mg/dL — ABNORMAL HIGH (ref 6–20)
CO2: 24 mmol/L (ref 22–32)
Calcium: 9 mg/dL (ref 8.9–10.3)
Chloride: 97 mmol/L — ABNORMAL LOW (ref 98–111)
Creatinine, Ser: 1.36 mg/dL — ABNORMAL HIGH (ref 0.44–1.00)
GFR, Estimated: 48 mL/min — ABNORMAL LOW (ref >60)
Glucose, Bld: 221 mg/dL — ABNORMAL HIGH (ref 70–99)
Potassium: 5 mmol/L (ref 3.5–5.1)
Sodium: 131 mmol/L — ABNORMAL LOW (ref 135–145)
Total Bilirubin: 0.6 mg/dL (ref 0.3–1.2)
Total Protein: 7.4 g/dL (ref 6.5–8.1)

## 2022-01-01 LAB — PROTIME-INR
INR: 1.9 — ABNORMAL HIGH (ref 0.8–1.2)
Prothrombin Time: 21.5 seconds — ABNORMAL HIGH (ref 11.4–15.2)

## 2022-01-01 LAB — APTT: aPTT: 42 seconds — ABNORMAL HIGH (ref 24–36)

## 2022-01-01 LAB — HCG, QUANTITATIVE, PREGNANCY: hCG, Beta Chain, Quant, S: 2 m[IU]/mL (ref <5)

## 2022-01-01 LAB — CBC WITH DIFFERENTIAL/PLATELET
Abs Immature Granulocytes: 0.12 10*3/uL — ABNORMAL HIGH (ref 0.00–0.07)
Basophils Absolute: 0.1 10*3/uL (ref 0.0–0.1)
Basophils Relative: 0 %
Eosinophils Absolute: 0.1 10*3/uL (ref 0.0–0.5)
Eosinophils Relative: 0 %
HCT: 38 % (ref 36.0–46.0)
Hemoglobin: 11.9 g/dL — ABNORMAL LOW (ref 12.0–15.0)
Immature Granulocytes: 1 %
Lymphocytes Relative: 10 %
Lymphs Abs: 1.7 10*3/uL (ref 0.7–4.0)
MCH: 27.7 pg (ref 26.0–34.0)
MCHC: 31.3 g/dL (ref 30.0–36.0)
MCV: 88.6 fL (ref 80.0–100.0)
Monocytes Absolute: 1.3 10*3/uL — ABNORMAL HIGH (ref 0.1–1.0)
Monocytes Relative: 8 %
Neutro Abs: 13.4 10*3/uL — ABNORMAL HIGH (ref 1.7–7.7)
Neutrophils Relative %: 81 %
Platelets: 227 10*3/uL (ref 150–400)
RBC: 4.29 MIL/uL (ref 3.87–5.11)
RDW: 13.9 % (ref 11.5–15.5)
WBC: 16.6 10*3/uL — ABNORMAL HIGH (ref 4.0–10.5)
nRBC: 0 % (ref 0.0–0.2)

## 2022-01-01 LAB — LACTIC ACID, PLASMA: Lactic Acid, Venous: 2 mmol/L (ref 0.5–1.9)

## 2022-01-01 LAB — RESP PANEL BY RT-PCR (FLU A&B, COVID) ARPGX2
Influenza A by PCR: NEGATIVE
Influenza B by PCR: NEGATIVE
SARS Coronavirus 2 by RT PCR: NEGATIVE

## 2022-01-01 MED ORDER — LACTATED RINGERS IV BOLUS (SEPSIS)
1000.0000 mL | Freq: Once | INTRAVENOUS | Status: AC
  Administered 2022-01-02: 1000 mL via INTRAVENOUS

## 2022-01-01 MED ORDER — LACTATED RINGERS IV BOLUS (SEPSIS)
1000.0000 mL | Freq: Once | INTRAVENOUS | Status: AC
  Administered 2022-01-01: 1000 mL via INTRAVENOUS

## 2022-01-01 MED ORDER — LACTATED RINGERS IV SOLN: INTRAVENOUS | Status: DC

## 2022-01-01 MED ORDER — ACETAMINOPHEN 500 MG PO TABS
1000.0000 mg | ORAL_TABLET | Freq: Once | ORAL | Status: AC
  Administered 2022-01-01: 1000 mg via ORAL
  Filled 2022-01-01: qty 2

## 2022-01-01 MED ORDER — SODIUM CHLORIDE 0.9 % IV SOLN
2.0000 g | Freq: Once | INTRAVENOUS | Status: AC
  Administered 2022-01-01: 2 g via INTRAVENOUS
  Filled 2022-01-01: qty 2

## 2022-01-01 NOTE — ED Provider Notes (Addendum)
?[facility]-EMERGENCY DEPT ?Provider Note ? ? ?CSN: [id number] ?Arrival date & time: 01/01/22  2157 ? ?  ? ?History ? ?Chief Complaint  ?Patient presents with  ? weakness  ? ? ?Karla Hoover is a 51 y.o. female with a history of CVA, hypertension, COPD, prior pulmonary embolism on Eliquis, hypothyroidism, and CKD who presents to the emergency department via EMS from rehab for evaluation of generalized weakness and bilateral flank pain.  Patient states she started to feel poorly yesterday with bilateral flank pain radiating into the abdomen with dysuria, frequency, fever, chills, and generalized weakness.  No alleviating or aggravating factors to her symptoms.  She states that she is currently on an antibiotic.  She denies cough, dyspnea, chest pain, diarrhea, or rashes. ? ?Paperwork that patient brought from facility with her reviewed for additional history.  It appears she was on Augmentin starting 02/27 twice daily for 5 days.  She was also started on Macrobid twice daily for 5 days 12/21/2021.  No antibiotic listed the patient will be taking currently. ? ?HPI ? ?  ? ?Home Medications ?Prior to Admission medications   ?Medication Sig Start Date End Date Taking? Authorizing Provider  ?acetaminophen (TYLENOL) 500 MG tablet Take 1,000 mg by mouth in the morning, at noon, and at bedtime.    [provider]  ?albuterol (VENTOLIN HFA) 108 (90 Base) MCG/ACT inhaler Inhale 2 puffs into the lungs every 6 (six) hours as needed for wheezing or shortness of breath. ?Patient taking differently: Inhale 2 puffs into the lungs every 6 (six) hours as needed for wheezing or shortness of breath ("COPD"). 10/04/19   Grayce Sessions, NP  ?Alum & Mag Hydroxide-Simeth (MAALOX PLUS PO) Take 30 mLs by mouth every 6 (six) hours as needed (heartburn and/or indigestion). Maalox Plus Suspension 225-200-25mg /53mL    [provider]  ?apixaban (ELIQUIS) 5 MG TABS tablet Take 2 tablets (10mg ) twice  daily for 7 days, then 1 tablet (5mg ) twice daily ?Patient taking differently: Take 5 mg by mouth 2 (two) times daily. 11/21/19   , DO  ?ascorbic acid (VITAMIN C) 500 MG tablet Take 500 mg by mouth daily.    [provider]  ?aspirin 81 MG chewable tablet Chew 81 mg by mouth in the morning.    [provider]  ?atorvastatin (LIPITOR) 80 MG tablet Take 1 tablet (80 mg total) by mouth daily. ?Patient taking differently: Take 80 mg by mouth every evening. 10/04/19   Darlin Drop, NP  ?chlorhexidine (PERIDEX) 0.12 % solution Use as directed 15 mLs in the mouth or throat daily.    [provider]  ?cyclobenzaprine (FLEXERIL) 10 MG tablet Take 1 tablet (10 mg total) by mouth 3 (three) times daily as needed for muscle spasms. ?Patient taking differently: Take 10 mg by mouth in the morning, at noon, and at bedtime. 10/04/19   Grayce Sessions, NP  ?fluticasone (FLONASE) 50 MCG/ACT nasal spray Place 1 spray into both nostrils in the morning and at bedtime. For uri; written to use for 10 days. Began 11/27/21    [provider]  ?folic acid (FOLVITE) 800 MCG tablet Take 800 mcg by mouth daily.    [provider]  ?gabapentin (NEURONTIN) 300 MG capsule Take 1 capsule (300 mg total) by mouth 3 (three) times daily. 11/11/19 12/02/21  01/09/20, NP  ?guaiFENesin (MUCINEX) 600 MG 12 hr tablet Take 600 mg by mouth 2 (two) times daily. Pt is to  take for 1 week. Began 11/26/21    [provider]  ?HYDROCORTISONE, TOPICAL, 1 % SOLN Apply 1 application topically 2 (two) times daily as needed (psoriasis). Apply to scalp, limb rash topically as needed for psoriasis bid prn.    [provider]  ?insulin glargine (LANTUS) 100 UNIT/ML injection Inject 50 Units into the skin daily.    [provider]  ?insulin lispro (HUMALOG) 100 UNIT/ML injection Inject 0-14 Units into the skin in the morning and at bedtime. Per sliding scale: 0-200=0/no  insulin; 201-250=2 units; 251-300=4 units; 301-350=6 units; 351-400=8 units: 401-450= 10 units; 451-500=12 units/ if BS reads high, inject 14 units of either Humalog or Novolog.    [provider]  ?ipratropium-albuterol (DUONEB) 0.5-2.5 (3) MG/3ML SOLN Inhale 1 vial via nebulizer 3 (three) times daily. 12/03/21   Swayze, Ava, DO  ?levothyroxine (SYNTHROID) 75 MCG tablet Take 75 mcg by mouth daily before breakfast.    [provider]  ?lidocaine (LIDODERM) 5 % Place 1 patch onto the skin daily. Remove & Discard patch within 12 hours or as directed by MD ?Patient taking differently: Place 1 patch onto the skin daily. Apply to upper shoulder/midback topically one time a day per schedule. Remove & Discard patch within 12 hours or as directed by MD (Apply 0900, take off at 2100) 04/01/21   Arby Barrette, MD  ?linagliptin (TRADJENTA) 5 MG TABS tablet Take 5 mg by mouth daily.    [provider]  ?loperamide (IMODIUM A-D) 2 MG tablet Take 2-4 mg by mouth See admin instructions. May give 4mg  after loose stool, then 2mg  after each additional loose stool. Do not exceed 8mg  in 24 hours.    [provider]  ?losartan (COZAAR) 100 MG tablet Take 100 mg by mouth daily.    [provider]  ?Menthol (BIOFREEZE) 5 % PTCH Apply 1 patch topically daily. Apply to posterior neck one time a day, and take off qhs    [provider]  ?metFORMIN (GLUCOPHAGE-XR) 500 MG 24 hr tablet Take 500 mg by mouth daily with breakfast.    [provider]  ?metoprolol tartrate (LOPRESSOR) 25 MG tablet Take 1 tablet (25 mg total) by mouth 2 (two) times daily. 10/04/19   , NP  ?Omega-3 Fatty Acids (FISH OIL) 1000 MG CAPS Take 1,000 mg by mouth in the morning and at bedtime.    [provider]  ?omeprazole (PRILOSEC) 20 MG capsule Take 20 mg by mouth daily.    [provider]  ?SYMBICORT 80-4.5 MCG/ACT inhaler Inhale 2 puffs into the lungs 2 (two) times  daily.    [provider]  ?vitamin B-12 (CYANOCOBALAMIN) 500 MCG tablet Take 500 mcg by mouth daily.    [provider]  ?   ? ?Allergies    ?Guaifenesin, Kiwi extract, Levaquin [levofloxacin in d5w], Strawberry extract, and Baclofen   ? ?Review of Systems   ?Review of Systems  ?Constitutional:  Positive for chills and fever.  ?Respiratory:  Negative for cough and shortness of breath.   ?Cardiovascular:  Negative for chest pain.  ?Gastrointestinal:  Negative for nausea and vomiting.  ?Genitourinary:  Positive for dysuria, flank pain and frequency.  ?Neurological:  Positive for weakness.  ?All other systems reviewed and are negative. ? ?Physical Exam ?Updated Vital Signs ?BP 115/78   Pulse (!) 135   Temp (!) 100.4 ?F (38 ?C) (Oral)   Resp (!) 23   Wt 91.2 kg   LMP 10/20/2019  SpO2 94%   BMI 31.96 kg/m?  ?Physical Exam ?Vitals and nursing note reviewed.  ?Constitutional:   ?   General: She is not in acute distress. ?   Appearance: She is well-developed. She is not toxic-appearing.  ?HENT:  ?   Head: Normocephalic and atraumatic.  ?Eyes:  ?   General:     ?   Right eye: No discharge.     ?   Left eye: No discharge.  ?   Conjunctiva/sclera: Conjunctivae normal.  ?Cardiovascular:  ?   Rate and Rhythm: Regular rhythm. Tachycardia present.  ?Pulmonary:  ?   Effort: No respiratory distress.  ?   Breath sounds: Normal breath sounds. No wheezing or rales.  ?Abdominal:  ?   General: There is no distension.  ?   Palpations: Abdomen is soft.  ?   Tenderness: There is abdominal tenderness (suprapubic). There is right CVA tenderness and left CVA tenderness. There is no guarding or rebound.  ?Musculoskeletal:  ?   Cervical back: Neck supple.  ?Skin: ?   General: Skin is warm and dry.  ?Neurological:  ?   Mental Status: She is alert.  ?   Comments: Clear speech.   ?Psychiatric:     ?   Behavior: Behavior normal.  ? ? ?ED Results / Procedures / Treatments   ?Labs ?(all labs ordered are listed, but only  abnormal results are displayed) ?Labs Reviewed  ?LACTIC ACID, PLASMA - Abnormal; Notable for the following components:  ?    Result Value  ? Lactic Acid, Venous 2.0 (*)   ? All other components within normal limits  ?COM

## 2022-01-01 NOTE — Sepsis Progress Note (Signed)
Elink following Code Sepsis. 

## 2022-01-01 NOTE — Progress Notes (Signed)
A consult was received from an ED physician for Cefepime per pharmacy dosing.  The patient's profile has been reviewed for ht/wt/allergies/indication/available labs.   ? ?A one time order has been placed for Cefepime 2gm IV.   ? ?Further antibiotics/pharmacy consults should be ordered by admitting physician if indicated.       ?                ?Thank you, ?Maryellen Pile, PharmD ?01/01/2022  10:36 PM ? ?

## 2022-01-01 NOTE — ED Triage Notes (Signed)
Pt in via [facility] from [facility], currently being trx for UTI w/PO abx from last visit. Today, pt has oral temp of 100.4, bilateral rib pain and generalized weakness. HR 130's ?

## 2022-01-02 ENCOUNTER — Encounter (HOSPITAL_COMMUNITY): Payer: Self-pay | Admitting: Internal Medicine

## 2022-01-02 ENCOUNTER — Emergency Department (HOSPITAL_COMMUNITY): Payer: Medicaid Other

## 2022-01-02 DIAGNOSIS — K219 Gastro-esophageal reflux disease without esophagitis: Secondary | ICD-10-CM | POA: Diagnosis present

## 2022-01-02 DIAGNOSIS — D631 Anemia in chronic kidney disease: Secondary | ICD-10-CM | POA: Diagnosis present

## 2022-01-02 DIAGNOSIS — I129 Hypertensive chronic kidney disease with stage 1 through stage 4 chronic kidney disease, or unspecified chronic kidney disease: Secondary | ICD-10-CM | POA: Diagnosis present

## 2022-01-02 DIAGNOSIS — N179 Acute kidney failure, unspecified: Secondary | ICD-10-CM | POA: Diagnosis present

## 2022-01-02 DIAGNOSIS — J449 Chronic obstructive pulmonary disease, unspecified: Secondary | ICD-10-CM

## 2022-01-02 DIAGNOSIS — E039 Hypothyroidism, unspecified: Secondary | ICD-10-CM | POA: Diagnosis present

## 2022-01-02 DIAGNOSIS — A419 Sepsis, unspecified organism: Secondary | ICD-10-CM | POA: Diagnosis present

## 2022-01-02 DIAGNOSIS — E669 Obesity, unspecified: Secondary | ICD-10-CM | POA: Diagnosis present

## 2022-01-02 DIAGNOSIS — N1831 Chronic kidney disease, stage 3a: Secondary | ICD-10-CM | POA: Diagnosis present

## 2022-01-02 DIAGNOSIS — E1122 Type 2 diabetes mellitus with diabetic chronic kidney disease: Secondary | ICD-10-CM

## 2022-01-02 DIAGNOSIS — E1165 Type 2 diabetes mellitus with hyperglycemia: Secondary | ICD-10-CM | POA: Diagnosis present

## 2022-01-02 DIAGNOSIS — J441 Chronic obstructive pulmonary disease with (acute) exacerbation: Secondary | ICD-10-CM | POA: Diagnosis present

## 2022-01-02 DIAGNOSIS — I69354 Hemiplegia and hemiparesis following cerebral infarction affecting left non-dominant side: Secondary | ICD-10-CM | POA: Diagnosis not present

## 2022-01-02 DIAGNOSIS — E871 Hypo-osmolality and hyponatremia: Secondary | ICD-10-CM | POA: Diagnosis present

## 2022-01-02 DIAGNOSIS — N12 Tubulo-interstitial nephritis, not specified as acute or chronic: Secondary | ICD-10-CM | POA: Diagnosis present

## 2022-01-02 DIAGNOSIS — Z794 Long term (current) use of insulin: Secondary | ICD-10-CM

## 2022-01-02 DIAGNOSIS — I5189 Other ill-defined heart diseases: Secondary | ICD-10-CM

## 2022-01-02 DIAGNOSIS — Z79899 Other long term (current) drug therapy: Secondary | ICD-10-CM | POA: Diagnosis not present

## 2022-01-02 DIAGNOSIS — N390 Urinary tract infection, site not specified: Secondary | ICD-10-CM | POA: Diagnosis present

## 2022-01-02 DIAGNOSIS — Z20822 Contact with and (suspected) exposure to covid-19: Secondary | ICD-10-CM | POA: Diagnosis present

## 2022-01-02 DIAGNOSIS — I10 Essential (primary) hypertension: Secondary | ICD-10-CM

## 2022-01-02 DIAGNOSIS — R6520 Severe sepsis without septic shock: Secondary | ICD-10-CM | POA: Diagnosis present

## 2022-01-02 DIAGNOSIS — N10 Acute tubulo-interstitial nephritis: Secondary | ICD-10-CM | POA: Diagnosis present

## 2022-01-02 DIAGNOSIS — Z7982 Long term (current) use of aspirin: Secondary | ICD-10-CM | POA: Diagnosis not present

## 2022-01-02 DIAGNOSIS — Z7901 Long term (current) use of anticoagulants: Secondary | ICD-10-CM | POA: Diagnosis not present

## 2022-01-02 DIAGNOSIS — Z1624 Resistance to multiple antibiotics: Secondary | ICD-10-CM | POA: Diagnosis present

## 2022-01-02 DIAGNOSIS — E782 Mixed hyperlipidemia: Secondary | ICD-10-CM | POA: Diagnosis present

## 2022-01-02 DIAGNOSIS — T7840XA Allergy, unspecified, initial encounter: Secondary | ICD-10-CM | POA: Diagnosis not present

## 2022-01-02 DIAGNOSIS — Z86718 Personal history of other venous thrombosis and embolism: Secondary | ICD-10-CM

## 2022-01-02 DIAGNOSIS — A4151 Sepsis due to Escherichia coli [E. coli]: Secondary | ICD-10-CM | POA: Diagnosis not present

## 2022-01-02 DIAGNOSIS — B9620 Unspecified Escherichia coli [E. coli] as the cause of diseases classified elsewhere: Secondary | ICD-10-CM | POA: Diagnosis present

## 2022-01-02 LAB — LACTIC ACID, PLASMA
Lactic Acid, Venous: 1.9 mmol/L (ref 0.5–1.9)
Lactic Acid, Venous: >9 mmol/L (ref 0.5–1.9)
Lactic Acid, Venous: 2.8 mmol/L (ref 0.5–1.9)
Lactic Acid, Venous: >9 mmol/L (ref 0.5–1.9)
Lactic Acid, Venous: 2.3 mmol/L (ref 0.5–1.9)
Lactic Acid, Venous: 2.7 mmol/L (ref 0.5–1.9)

## 2022-01-02 LAB — TSH: TSH: 1.323 u[IU]/mL (ref 0.350–4.500)

## 2022-01-02 LAB — CBC WITH DIFFERENTIAL/PLATELET
Abs Immature Granulocytes: 0.1 10*3/uL — ABNORMAL HIGH (ref 0.00–0.07)
Basophils Absolute: 0 10*3/uL (ref 0.0–0.1)
Basophils Relative: 0 %
Eosinophils Absolute: 0 10*3/uL (ref 0.0–0.5)
Eosinophils Relative: 0 %
HCT: 34.6 % — ABNORMAL LOW (ref 36.0–46.0)
Hemoglobin: 11.2 g/dL — ABNORMAL LOW (ref 12.0–15.0)
Immature Granulocytes: 1 %
Lymphocytes Relative: 14 %
Lymphs Abs: 1.9 10*3/uL (ref 0.7–4.0)
MCH: 28.1 pg (ref 26.0–34.0)
MCHC: 32.4 g/dL (ref 30.0–36.0)
MCV: 86.9 fL (ref 80.0–100.0)
Monocytes Absolute: 1.2 10*3/uL — ABNORMAL HIGH (ref 0.1–1.0)
Monocytes Relative: 9 %
Neutro Abs: 10.7 10*3/uL — ABNORMAL HIGH (ref 1.7–7.7)
Neutrophils Relative %: 76 %
Platelets: 204 10*3/uL (ref 150–400)
RBC: 3.98 MIL/uL (ref 3.87–5.11)
RDW: 14 % (ref 11.5–15.5)
WBC: 13.9 10*3/uL — ABNORMAL HIGH (ref 4.0–10.5)
nRBC: 0 % (ref 0.0–0.2)

## 2022-01-02 LAB — URINALYSIS, ROUTINE W REFLEX MICROSCOPIC
Bilirubin Urine: NEGATIVE
Glucose, UA: NEGATIVE mg/dL
Ketones, ur: NEGATIVE mg/dL
Nitrite: NEGATIVE
Protein, ur: NEGATIVE mg/dL
Specific Gravity, Urine: 1.011 (ref 1.005–1.030)
pH: 7 (ref 5.0–8.0)

## 2022-01-02 LAB — COMPREHENSIVE METABOLIC PANEL
ALT: 16 U/L (ref 0–44)
AST: 17 U/L (ref 15–41)
Albumin: 2.7 g/dL — ABNORMAL LOW (ref 3.5–5.0)
Alkaline Phosphatase: 81 U/L (ref 38–126)
Anion gap: 8 (ref 5–15)
BUN: 23 mg/dL — ABNORMAL HIGH (ref 6–20)
CO2: 26 mmol/L (ref 22–32)
Calcium: 8.8 mg/dL — ABNORMAL LOW (ref 8.9–10.3)
Chloride: 98 mmol/L (ref 98–111)
Creatinine, Ser: 1.28 mg/dL — ABNORMAL HIGH (ref 0.44–1.00)
GFR, Estimated: 51 mL/min — ABNORMAL LOW (ref >60)
Glucose, Bld: 202 mg/dL — ABNORMAL HIGH (ref 70–99)
Potassium: 4.8 mmol/L (ref 3.5–5.1)
Sodium: 132 mmol/L — ABNORMAL LOW (ref 135–145)
Total Bilirubin: 0.7 mg/dL (ref 0.3–1.2)
Total Protein: 6.6 g/dL (ref 6.5–8.1)

## 2022-01-02 LAB — GLUCOSE, CAPILLARY
Glucose-Capillary: 179 mg/dL — ABNORMAL HIGH (ref 70–99)
Glucose-Capillary: 159 mg/dL — ABNORMAL HIGH (ref 70–99)
Glucose-Capillary: 78 mg/dL (ref 70–99)
Glucose-Capillary: 135 mg/dL — ABNORMAL HIGH (ref 70–99)

## 2022-01-02 LAB — TROPONIN I (HIGH SENSITIVITY)
Troponin I (High Sensitivity): 10 ng/L (ref <18)
Troponin I (High Sensitivity): 10 ng/L (ref <18)

## 2022-01-02 LAB — PROCALCITONIN: Procalcitonin: 4.17 ng/mL

## 2022-01-02 LAB — MRSA NEXT GEN BY PCR, NASAL: MRSA by PCR Next Gen: NOT DETECTED

## 2022-01-02 MED ORDER — MOMETASONE FURO-FORMOTEROL FUM 100-5 MCG/ACT IN AERO
2.0000 | INHALATION_SPRAY | Freq: Two times a day (BID) | RESPIRATORY_TRACT | Status: DC
  Administered 2022-01-02 – 2022-01-05 (×7): 2 via RESPIRATORY_TRACT
  Filled 2022-01-02: qty 8.8

## 2022-01-02 MED ORDER — METOPROLOL TARTRATE 5 MG/5ML IV SOLN
5.0000 mg | Freq: Once | INTRAVENOUS | Status: AC
  Administered 2022-01-02: 5 mg via INTRAVENOUS
  Filled 2022-01-02: qty 5

## 2022-01-02 MED ORDER — KETOROLAC TROMETHAMINE 15 MG/ML IJ SOLN
15.0000 mg | Freq: Once | INTRAMUSCULAR | Status: AC
Start: 2022-01-02 — End: 2022-01-02
  Administered 2022-01-02: 15 mg via INTRAVENOUS
  Filled 2022-01-02: qty 1

## 2022-01-02 MED ORDER — ATORVASTATIN CALCIUM 40 MG PO TABS
80.0000 mg | ORAL_TABLET | Freq: Every evening | ORAL | Status: DC
  Administered 2022-01-02 – 2022-01-05 (×4): 80 mg via ORAL
  Filled 2022-01-02 (×4): qty 2

## 2022-01-02 MED ORDER — LOSARTAN POTASSIUM 50 MG PO TABS
100.0000 mg | ORAL_TABLET | Freq: Every day | ORAL | Status: DC
  Administered 2022-01-02 – 2022-01-05 (×4): 100 mg via ORAL
  Filled 2022-01-02 (×5): qty 2

## 2022-01-02 MED ORDER — LIDOCAINE 5 % EX PTCH
1.0000 | MEDICATED_PATCH | CUTANEOUS | Status: DC
  Administered 2022-01-02 – 2022-01-04 (×3): 1 via TRANSDERMAL
  Filled 2022-01-02 (×5): qty 1

## 2022-01-02 MED ORDER — SODIUM CHLORIDE 0.9 % IV SOLN
2.0000 g | INTRAVENOUS | Status: DC
  Administered 2022-01-02 – 2022-01-04 (×3): 2 g via INTRAVENOUS
  Filled 2022-01-02 (×3): qty 20

## 2022-01-02 MED ORDER — SODIUM CHLORIDE 0.9 % IV SOLN: INTRAVENOUS | Status: DC

## 2022-01-02 MED ORDER — CHLORHEXIDINE GLUCONATE CLOTH 2 % EX PADS
6.0000 | MEDICATED_PAD | Freq: Every day | CUTANEOUS | Status: DC
  Administered 2022-01-02 – 2022-01-04 (×3): 6 via TOPICAL

## 2022-01-02 MED ORDER — FOLIC ACID 1 MG PO TABS
1.0000 mg | ORAL_TABLET | Freq: Every day | ORAL | Status: DC
  Administered 2022-01-02 – 2022-01-06 (×5): 1 mg via ORAL
  Filled 2022-01-02 (×5): qty 1

## 2022-01-02 MED ORDER — LABETALOL HCL 5 MG/ML IV SOLN
5.0000 mg | INTRAVENOUS | Status: DC | PRN
  Administered 2022-01-02 – 2022-01-05 (×7): 5 mg via INTRAVENOUS
  Filled 2022-01-02 (×8): qty 4

## 2022-01-02 MED ORDER — GABAPENTIN 300 MG PO CAPS
300.0000 mg | ORAL_CAPSULE | Freq: Three times a day (TID) | ORAL | Status: DC
  Administered 2022-01-02 – 2022-01-06 (×13): 300 mg via ORAL
  Filled 2022-01-02 (×13): qty 1

## 2022-01-02 MED ORDER — CYANOCOBALAMIN 500 MCG PO TABS
500.0000 ug | ORAL_TABLET | Freq: Every day | ORAL | Status: DC
  Administered 2022-01-02 – 2022-01-06 (×5): 500 ug via ORAL
  Filled 2022-01-02 (×5): qty 1

## 2022-01-02 MED ORDER — LACTATED RINGERS IV SOLN: INTRAVENOUS | Status: DC

## 2022-01-02 MED ORDER — APIXABAN 5 MG PO TABS
5.0000 mg | ORAL_TABLET | Freq: Two times a day (BID) | ORAL | Status: DC
  Administered 2022-01-02 – 2022-01-06 (×9): 5 mg via ORAL
  Filled 2022-01-02 (×9): qty 1

## 2022-01-02 MED ORDER — INSULIN ASPART 100 UNIT/ML IJ SOLN
0.0000 [IU] | Freq: Three times a day (TID) | INTRAMUSCULAR | Status: DC
  Administered 2022-01-02 (×2): 2 [IU] via SUBCUTANEOUS
  Administered 2022-01-03: 1 [IU] via SUBCUTANEOUS
  Administered 2022-01-03: 3 [IU] via SUBCUTANEOUS
  Administered 2022-01-04: 2 [IU] via SUBCUTANEOUS
  Administered 2022-01-04 – 2022-01-05 (×3): 3 [IU] via SUBCUTANEOUS
  Administered 2022-01-05: 5 [IU] via SUBCUTANEOUS
  Administered 2022-01-06: 3 [IU] via SUBCUTANEOUS
  Administered 2022-01-06: 2 [IU] via SUBCUTANEOUS
  Filled 2022-01-02: qty 0.09

## 2022-01-02 MED ORDER — IOHEXOL 300 MG/ML  SOLN
100.0000 mL | Freq: Once | INTRAMUSCULAR | Status: AC | PRN
  Administered 2022-01-02: 100 mL via INTRAVENOUS

## 2022-01-02 MED ORDER — UMECLIDINIUM BROMIDE 62.5 MCG/ACT IN AEPB
1.0000 | INHALATION_SPRAY | Freq: Every morning | RESPIRATORY_TRACT | Status: DC
  Administered 2022-01-03 – 2022-01-04 (×2): 1 via RESPIRATORY_TRACT
  Filled 2022-01-02: qty 7

## 2022-01-02 MED ORDER — CYCLOBENZAPRINE HCL 10 MG PO TABS
10.0000 mg | ORAL_TABLET | Freq: Three times a day (TID) | ORAL | Status: DC | PRN
  Administered 2022-01-04: 10 mg via ORAL
  Filled 2022-01-02: qty 1

## 2022-01-02 MED ORDER — PANTOPRAZOLE SODIUM 40 MG PO TBEC
40.0000 mg | DELAYED_RELEASE_TABLET | Freq: Every day | ORAL | Status: DC
  Administered 2022-01-02 – 2022-01-06 (×5): 40 mg via ORAL
  Filled 2022-01-02 (×5): qty 1

## 2022-01-02 MED ORDER — ALBUTEROL SULFATE (2.5 MG/3ML) 0.083% IN NEBU: 2.5000 mg | INHALATION_SOLUTION | Freq: Four times a day (QID) | RESPIRATORY_TRACT | Status: DC | PRN

## 2022-01-02 MED ORDER — ASPIRIN 81 MG PO CHEW
81.0000 mg | CHEWABLE_TABLET | Freq: Every day | ORAL | Status: DC
  Administered 2022-01-02 – 2022-01-06 (×5): 81 mg via ORAL
  Filled 2022-01-02 (×5): qty 1

## 2022-01-02 MED ORDER — LEVOTHYROXINE SODIUM 50 MCG PO TABS
75.0000 ug | ORAL_TABLET | Freq: Every day | ORAL | Status: DC
  Administered 2022-01-02 – 2022-01-06 (×5): 75 ug via ORAL
  Filled 2022-01-02 (×5): qty 1

## 2022-01-02 MED ORDER — ACETAMINOPHEN 650 MG RE SUPP: 650.0000 mg | Freq: Four times a day (QID) | RECTAL | Status: DC | PRN

## 2022-01-02 MED ORDER — ACETAMINOPHEN 325 MG PO TABS
650.0000 mg | ORAL_TABLET | Freq: Four times a day (QID) | ORAL | Status: DC | PRN
Start: 2022-01-02 — End: 2022-01-06
  Administered 2022-01-02 – 2022-01-05 (×5): 650 mg via ORAL
  Filled 2022-01-02 (×5): qty 2

## 2022-01-02 MED ORDER — MELATONIN 5 MG PO TABS
5.0000 mg | ORAL_TABLET | Freq: Every day | ORAL | Status: DC
  Administered 2022-01-02 – 2022-01-05 (×4): 5 mg via ORAL
  Filled 2022-01-02 (×4): qty 1

## 2022-01-02 MED ORDER — METOPROLOL TARTRATE 25 MG PO TABS
25.0000 mg | ORAL_TABLET | Freq: Two times a day (BID) | ORAL | Status: DC
Start: 2022-01-02 — End: 2022-01-06
  Administered 2022-01-02 – 2022-01-05 (×8): 25 mg via ORAL
  Filled 2022-01-02 (×9): qty 1

## 2022-01-02 MED ORDER — INSULIN GLARGINE-YFGN 100 UNIT/ML ~~LOC~~ SOLN
22.0000 [IU] | Freq: Every day | SUBCUTANEOUS | Status: DC
  Administered 2022-01-02 – 2022-01-06 (×5): 22 [IU] via SUBCUTANEOUS
  Filled 2022-01-02 (×5): qty 0.22

## 2022-01-02 NOTE — H&P (Signed)
?History and Physical  ? ? ?Karla Hoover [id number] DOB: May 27, 1971 DOA: 01/01/2022 ? ?PCP: Patient, [name]  ?Patient coming from: Skilled nursing facility. ? ?Chief Complaint: Fever and chills. ? ?HPI: Karla Hoover is a 51 y.o. female with history of stroke with left-sided hemiplegia, pulmonary embolism, diastolic dysfunction, hypothyroidism, chronic kidney disease stage III, has been experiencing fever and chills for the last 2 days.  Right flank pain.  Patient states she has been having right flank pain for almost a week now.  Any nausea vomiting diarrhea chest pain or shortness of breath. ? ?ED Course: In the ER patient was initially hypotensive tachycardic with fever 100.8 ?F leukocytosis and lactic acidosis consistent with severe sepsis.  Patient blood pressure improved with fluids had blood cultures drawn CT scan of the abdomen and pelvis was done which shows features concerning for acute pyelonephritis on the right side.  Patient admitted for further management of sepsis secondary to pyelonephritis.  UA shows WBCs bacteria and leukocyte esterase.  Chest x-ray unremarkable. ? ?Review of Systems: As per HPI, rest all negative. ? ? ?Past Medical History:  ?Diagnosis Date  ? Asthma   ? Chronic kidney disease, stage 3a ([place]) 02/09/2021  ? Chronic respiratory failure with hypoxia ([place]) 02/09/2021  ? COPD (chronic obstructive pulmonary disease) ([place])   ? COPD with chronic bronchitis ([place]) 05/04/2009  ? Former smoker 36 pack year smoking history    ? Diabetes mellitus without complication ([place])   ? Essential hypertension 05/04/2009  ? Qualifier: Diagnosis of  By: Quentin Cornwall CMA, Janett Billow    ? GERD without esophagitis 02/09/2021  ? Grade I diastolic dysfunction [id number]  ? Hypertension   ? Hypothyroidism 02/09/2021  ? Mixed 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 ([place]) 02/09/2021  ? Pulmonary embolism ([place]) 11/16/2019  ? Stroke [facility])   ? Type 2 diabetes mellitus with stage 3a  chronic kidney disease, with long-term current use of insulin ([place]) 02/09/2021  ? ? ?History reviewed. No pertinent surgical history. ? ? reports that she quit smoking about 2 years ago. Her smoking use included cigarettes. She started smoking about 38 years ago. She has a 18.00 pack-year smoking history. She has never used smokeless tobacco. She reports that she does not drink alcohol and does not use drugs. ? ?Allergies  ?Allergen Reactions  ? Guaifenesin Anaphylaxis  ? Kiwi Extract Anaphylaxis and Rash  ? Levaquin [Levofloxacin In D5w] Shortness Of Breath and Rash  ? Strawberry Extract Anaphylaxis and Rash  ? Baclofen Other (See Comments)  ?  Near syncope/ fall  ? ? ?Family History  ?Problem Relation Age of Onset  ? Diabetes Mother   ? COPD Sister   ? Heart failure Sister   ? ? ?Prior to Admission medications   ?Medication Sig Start Date End Date Taking? Authorizing Provider  ?acetaminophen (TYLENOL) 500 MG tablet Take 1,000 mg by mouth in the morning, at noon, and at bedtime.    [provider]  ?albuterol (VENTOLIN HFA) 108 (90 Base) MCG/ACT inhaler Inhale 2 puffs into the lungs every 6 (six) hours as needed for wheezing or shortness of breath. ?Patient taking differently: Inhale 2 puffs into the lungs every 6 (six) hours as needed for wheezing or shortness of breath ("COPD"). 10/04/19   Kerin Perna, NP  ?Alum & Mag Hydroxide-Simeth (MAALOX PLUS PO) Take 30 mLs by mouth every 6 (six) hours as needed (heartburn and/or indigestion). Maalox Plus Suspension 225-200-25mg /43mL    [provider]  ?  apixaban (ELIQUIS) 5 MG TABS tablet Take 2 tablets (10mg ) twice daily for 7 days, then 1 tablet (5mg ) twice daily ?Patient taking differently: Take 5 mg by mouth 2 (two) times daily. 11/21/19   Kayleen Memos, DO  ?ascorbic acid (VITAMIN C) 500 MG tablet Take 500 mg by mouth daily.    [provider]  ?aspirin 81 MG chewable tablet Chew 81 mg by mouth in the morning.    [provider]  ?atorvastatin (LIPITOR) 80 MG tablet Take 1 tablet (80 mg total) by mouth daily. ?Patient taking differently: Take 80 mg by mouth every evening. 10/04/19   Kerin Perna, NP  ?chlorhexidine (PERIDEX) 0.12 % solution Use as directed 15 mLs in the mouth or throat daily.    [provider]  ?cyclobenzaprine (FLEXERIL) 10 MG tablet Take 1 tablet (10 mg total) by mouth 3 (three) times daily as needed for muscle spasms. ?Patient taking differently: Take 10 mg by mouth in the morning, at noon, and at bedtime. 10/04/19   Kerin Perna, NP  ?fluticasone (FLONASE) 50 MCG/ACT nasal spray Place 1 spray into both nostrils in the morning and at bedtime. For uri; written to use for 10 days. Began 11/27/21    [provider]  ?folic acid (FOLVITE) Q000111Q MCG tablet Take 800 mcg by mouth daily.    [provider]  ?gabapentin (NEURONTIN) 300 MG capsule Take 1 capsule (300 mg total) by mouth 3 (three) times daily. 11/11/19 12/02/21  Kerin Perna, NP  ?guaiFENesin (MUCINEX) 600 MG 12 hr tablet Take 600 mg by mouth 2 (two) times daily. Pt is to take for 1 week. Began 11/26/21    [provider]  ?HYDROCORTISONE, TOPICAL, 1 % SOLN Apply 1 application topically 2 (two) times daily as needed (psoriasis). Apply to scalp, limb rash topically as needed for psoriasis bid prn.    [provider]  ?insulin glargine (LANTUS) 100 UNIT/ML injection Inject 50 Units into the skin daily.    [provider]  ?insulin lispro (HUMALOG) 100 UNIT/ML injection Inject 0-14 Units into the skin in the morning and at bedtime. Per sliding scale: 0-200=0/no insulin; 201-250=2 units; 251-300=4 units; 301-350=6 units; 351-400=8 units: 401-450= 10 units; 451-500=12 units/ if BS reads high, inject 14 units of either Humalog or Novolog.    [provider]  ?ipratropium-albuterol (DUONEB) 0.5-2.5 (3) MG/3ML SOLN Inhale 1 vial via nebulizer 3 (three) times daily. 12/03/21   Swayze, Ava, DO   ?levothyroxine (SYNTHROID) 75 MCG tablet Take 75 mcg by mouth daily before breakfast.    [provider]  ?lidocaine (LIDODERM) 5 % Place 1 patch onto the skin daily. Remove & Discard patch within 12 hours or as directed by MD ?Patient taking differently: Place 1 patch onto the skin daily. Apply to upper shoulder/midback topically one time a day per schedule. Remove & Discard patch within 12 hours or as directed by MD (Apply 0900, take off at 2100) 04/01/21   Charlesetta Shanks, MD  ?linagliptin (TRADJENTA) 5 MG TABS tablet Take 5 mg by mouth daily.    [provider]  ?loperamide (IMODIUM A-D) 2 MG tablet Take 2-4 mg by mouth See admin instructions. May give 4mg  after loose stool, then 2mg  after each additional loose stool. Do not exceed 8mg  in 24 hours.    [provider]  ?losartan (COZAAR) 100 MG tablet Take 100 mg by mouth daily.    [provider]  ?Menthol (BIOFREEZE) 5 % PTCH Apply  1 patch topically daily. Apply to posterior neck one time a day, and take off qhs    [provider]  ?metFORMIN (GLUCOPHAGE-XR) 500 MG 24 hr tablet Take 500 mg by mouth daily with breakfast.    [provider]  ?metoprolol tartrate (LOPRESSOR) 25 MG tablet Take 1 tablet (25 mg total) by mouth 2 (two) times daily. 10/04/19   Kerin Perna, NP  ?Omega-3 Fatty Acids (FISH OIL) 1000 MG CAPS Take 1,000 mg by mouth in the morning and at bedtime.    [provider]  ?omeprazole (PRILOSEC) 20 MG capsule Take 20 mg by mouth daily.    [provider]  ?SYMBICORT 80-4.5 MCG/ACT inhaler Inhale 2 puffs into the lungs 2 (two) times daily.    [provider]  ?vitamin B-12 (CYANOCOBALAMIN) 500 MCG tablet Take 500 mcg by mouth daily.    [provider]  ? ? ?Physical Exam: ?Constitutional: Moderately built and nourished. ?Vitals:  ? 01/02/22 0231 01/02/22 0304 01/02/22 0345 01/02/22 0400  ?BP: (!) 149/120 (!) 158/99  (!) 140/91  ?Pulse: (!) 133 (!) 139   (!) 139  ?Resp: 19     ?Temp: (!) 100.8 ?F (38.2 ?C)     ?TempSrc: Oral     ?SpO2: 96% 94% 94%   ?Weight:      ? ?Eyes: Anicteric no pallor. ?ENMT: No discharge from the ears eyes nose and mouth. ?Neck: No

## 2022-01-02 NOTE — Progress Notes (Addendum)
Same day note ? ?Patient seen and examined at bedside. ? ?Patient was admitted to the hospital for fever and chills. ? ?At the time of my evaluation, patient complains of chills,rigor, fever. Nurse noted high BP.  ? ?Physical examination reveals female with chills and rigor, left sided hemiplegia., ? ?Laboratory data and imaging was reviewed ? ?Assessment and Plan. ? ?Sepsis secondary to acute pyelonephritis ?CT scan of the abdomen showing mild right-sided acute pyelonephritis.  Continue IV antibiotics.  Follow urine culture blood cultures.  Lactate was elevated with tachycardia and leukocytosis.  Leukocytosis has trended down.  Lactate has normalized at this time after IV fluid boluses.  COVID and influenza was negative.  Urinalysis with moderate hemoglobin leukocytes nitrate negative WBC 11-20. ? ?Elevated lactate. Received septic bolus. On IV antibiotics. Change RL to normal saline. ? ?History of hypertension  ?Continue ARB, metoprolol.   ? ?Diabetes mellitus type 2 with hyperglycemia - ?Continue sliding scale insulin, Accu-Cheks, diabetic diet.  Continue long-acting insulin as well.  ? ?History of PE on Eliquis.   ?No acute issues at this time.  Continue Eliquis.  INR elevated. ? ?Chronic kidney disease stage III  ?Continue to monitor creatinine.  Creatinine at baseline. ? ?Anemia  ?Likely anemia of chronic disease.  Latest hemoglobin of 11.2 ? ?Hypothyroidism ?Continue Synthroid.  TSH of 1.3 ? ?History of COPD.  Currently compensated.  Continue inhalers. ? ?History of stroke with left-sided hemiplegia.   ?On statins and aspirin.  Continue Eliquis for PE.   ? ?Mild hyponatremia.  We will continue to monitor.  Received IV fluids. ? ?I spoke with the patient's mother on the phone and updated her about the clinical condition of the patient.  ? ?No charge. ? ?Signed, ? ?Delila Pereyra, MD ?[facility] ? ?

## 2022-01-03 DIAGNOSIS — K5900 Constipation, unspecified: Secondary | ICD-10-CM

## 2022-01-03 LAB — COMPREHENSIVE METABOLIC PANEL
ALT: 11 U/L (ref 0–44)
AST: 14 U/L — ABNORMAL LOW (ref 15–41)
Albumin: 2.4 g/dL — ABNORMAL LOW (ref 3.5–5.0)
Alkaline Phosphatase: 75 U/L (ref 38–126)
Anion gap: 6 (ref 5–15)
BUN: 18 mg/dL (ref 6–20)
CO2: 29 mmol/L (ref 22–32)
Calcium: 8.6 mg/dL — ABNORMAL LOW (ref 8.9–10.3)
Chloride: 99 mmol/L (ref 98–111)
Creatinine, Ser: 1.19 mg/dL — ABNORMAL HIGH (ref 0.44–1.00)
GFR, Estimated: 56 mL/min — ABNORMAL LOW (ref >60)
Glucose, Bld: 122 mg/dL — ABNORMAL HIGH (ref 70–99)
Potassium: 4.3 mmol/L (ref 3.5–5.1)
Sodium: 134 mmol/L — ABNORMAL LOW (ref 135–145)
Total Bilirubin: 0.5 mg/dL (ref 0.3–1.2)
Total Protein: 6.1 g/dL — ABNORMAL LOW (ref 6.5–8.1)

## 2022-01-03 LAB — CBC
HCT: 30.4 % — ABNORMAL LOW (ref 36.0–46.0)
Hemoglobin: 9.5 g/dL — ABNORMAL LOW (ref 12.0–15.0)
MCH: 27.3 pg (ref 26.0–34.0)
MCHC: 31.3 g/dL (ref 30.0–36.0)
MCV: 87.4 fL (ref 80.0–100.0)
Platelets: 190 10*3/uL (ref 150–400)
RBC: 3.48 MIL/uL — ABNORMAL LOW (ref 3.87–5.11)
RDW: 14.1 % (ref 11.5–15.5)
WBC: 11.5 10*3/uL — ABNORMAL HIGH (ref 4.0–10.5)
nRBC: 0 % (ref 0.0–0.2)

## 2022-01-03 LAB — MAGNESIUM: Magnesium: 1.7 mg/dL (ref 1.7–2.4)

## 2022-01-03 LAB — TROPONIN I (HIGH SENSITIVITY)
Troponin I (High Sensitivity): 9 ng/L (ref <18)
Troponin I (High Sensitivity): 8 ng/L (ref <18)

## 2022-01-03 LAB — GLUCOSE, CAPILLARY
Glucose-Capillary: 123 mg/dL — ABNORMAL HIGH (ref 70–99)
Glucose-Capillary: 220 mg/dL — ABNORMAL HIGH (ref 70–99)
Glucose-Capillary: 117 mg/dL — ABNORMAL HIGH (ref 70–99)
Glucose-Capillary: 199 mg/dL — ABNORMAL HIGH (ref 70–99)

## 2022-01-03 MED ORDER — SODIUM CHLORIDE 0.9 % IV SOLN: INTRAVENOUS | Status: AC

## 2022-01-03 MED ORDER — DOCUSATE SODIUM 100 MG PO CAPS
100.0000 mg | ORAL_CAPSULE | Freq: Two times a day (BID) | ORAL | Status: DC
  Administered 2022-01-03 – 2022-01-06 (×6): 100 mg via ORAL
  Filled 2022-01-03 (×6): qty 1

## 2022-01-03 MED ORDER — DICLOFENAC SODIUM 1 % EX GEL
2.0000 g | Freq: Four times a day (QID) | CUTANEOUS | Status: DC
  Administered 2022-01-03 – 2022-01-06 (×11): 2 g via TOPICAL
  Filled 2022-01-03 (×2): qty 100

## 2022-01-03 MED ORDER — NITROGLYCERIN 0.4 MG SL SUBL: 0.4000 mg | SUBLINGUAL_TABLET | SUBLINGUAL | Status: DC | PRN

## 2022-01-03 MED ORDER — POLYETHYLENE GLYCOL 3350 17 G PO PACK
17.0000 g | PACK | Freq: Every day | ORAL | Status: DC
  Administered 2022-01-04: 17 g via ORAL
  Filled 2022-01-03 (×3): qty 1

## 2022-01-03 MED ORDER — BISACODYL 10 MG RE SUPP: 10.0000 mg | Freq: Every day | RECTAL | Status: DC | PRN

## 2022-01-03 NOTE — Progress Notes (Signed)
?PROGRESS NOTE ? ? ? ?Karla Hoover  [id number] DOB: Jul 12, 1971 DOA: 01/01/2022 ?PCP: Patient, [name]  ? ? ?Brief Narrative:  ? ?Karla Hoover is a 51 y.o. female from skilled nursing facility with history of stroke with left-sided hemiplegia, pulmonary embolism, diastolic dysfunction, hypothyroidism, chronic kidney disease stage III, presented to the hospital with fever and chills with right flank pain.  In the ED, patient was hypotensive, tachycardic with a temperature of 100.8 ?F.  She had leukocytosis and lactic acidosis suggestive of severe sepsis.  Septic bolus was given and pancultures were sent and patient was started on broad-spectrum antibiotic.  CT scan of the abdomen pelvis showed acute pyelonephritis in the right side.   Chest x-ray was unremarkable.  ?  ?Assessment and Plan: ? ?Severe sepsis secondary to acute pyelonephritis ?CT scan of the abdomen showing mild right-sided acute pyelonephritis.  Continue IV antibiotics with Rocephin IV.  Blood cultures negative to date.    Leukocytosis has trended down to 11.5 from 16.6.Marland Kitchen   COVID and influenza was negative.  Urinalysis with moderate hemoglobin leukocytes nitrate negative, WBC 11-20.  Follow urine cultures. ?  ?Elevated lactate. Received septic bolus. On IV antibiotics, continue with normal saline for 1 more day.  Reassess need for fluids in AM. ?  ?History of hypertension  ?Continue ARB, metoprolol.  On as needed labetalol for accelerated hypertension.  Patient is on losartan hydrochlorothiazide at home. ?  ?Diabetes mellitus type 2 with hyperglycemia - ?Continue sliding scale, long-acting insulin, Accu-Cheks, diabetic diet.   ?  ?History of PE on Eliquis.   ?No acute issues at this time.  Continue Eliquis.  ?  ?Chronic kidney disease stage III  ?Creatinine of 1.1.  At baseline. ?  ?Anemia  ?Likely anemia of chronic disease.  Latest hemoglobin of 9.5, patient received IV fluids.  Could be component of dilution.  No external  blood loss ?  ?Hypothyroidism ?Continue Synthroid.  TSH of 1.3 ?  ?History of COPD.  Currently compensated.  Continue inhalers. ?  ?History of stroke with left-sided hemiplegia.   ?On statins and aspirin.  Continue Eliquis for PE.   ?  ?Mild hyponatremia.  We will continue to monitor.  Sodium of 134.  Receiving normal saline. ? ?Constipation.  We will put the patient on bowel regimen. ? ?Chest pain.  Appears atypical.  EKG unremarkable.  Will get troponins.  Reproducible tenderness on palpation.  We will add warm compress and diclofenac gel.  As needed nitroglycerin.  Will need better blood pressure control. ?  ? ? DVT prophylaxis:  ?apixaban (ELIQUIS) tablet 5 mg  ? ?Code Status:   ?  Code Status: Full Code ? ?Disposition: Skilled nursing facility ? ?Status is: Inpatient ? ?Remains inpatient appropriate because: Sepsis, IV antibiotic ? ? Family Communication:  ?Spoke with the patient's mother on the phone on 01/02/22 ? ?Consultants:  ?None ? ?Procedures:  ?None ? ?Antimicrobials:  ?Rocephin IV ? ?Anti-infectives (From admission, onward)  ? ? Start     Dose/Rate Route Frequency Ordered Stop  ? 01/02/22 0600  cefTRIAXone (ROCEPHIN) 2 g in sodium chloride 0.9 % 100 mL IVPB       ? 2 g ?200 mL/hr over 30 Minutes Intravenous Every 24 hours 01/02/22 0424 01/09/22 0559  ? 01/01/22 2245  ceFEPIme (MAXIPIME) 2 g in sodium chloride 0.9 % 100 mL IVPB       ? 2 g ?200 mL/hr over 30 Minutes Intravenous  Once 01/01/22 2232 01/01/22  2311  ? ?  ? ?Subjective: ?Today, patient was seen and examined at bedside.  Patient did complain of a mild chest pain this morning.  Denies shortness of breath.  Feels like she is very constipated and has not moved her bowels in 4 days. ? ?Objective: ?Vitals:  ? 01/03/22 0400 01/03/22 0500 01/03/22 0600 01/03/22 0700  ?BP: 126/68 (!) 157/77 (!) 170/110 (!) 164/107  ?Pulse: 88  100   ?Resp: 16 15 18 17   ?Temp:      ?TempSrc:      ?SpO2: 97%  98%   ?Weight:      ? ? ?Intake/Output Summary (Last 24  hours) at 01/03/2022 0723 ?Last data filed at 01/03/2022 [phone] ?Gross per 24 hour  ?Intake 2930.85 ml  ?Output 2700 ml  ?Net 230.85 ml  ? ?Filed Weights  ? 01/01/22 2214  ?Weight: 91.2 kg  ? ?Body mass index is 31.96 kg/m?.  ? ?Physical Examination: ? ?General: Obese built, not in obvious distress, alert awake and communicative. ?HENT:   No scleral pallor or icterus noted. Oral mucosa is moist.  ?Chest:  Clear breath sounds.  Diminished breath sounds bilaterally. No crackles or wheezes.  Left chest wall tenderness on palpation. ?CVS: S1 &S2 heard. No murmur.  Regular rate and rhythm. ?Abdomen: Soft, nontender, nondistended.  Bowel sounds are heard.   ?Extremities: No cyanosis, clubbing or edema.  Peripheral pulses are palpable. ?Psych: Alert, awake and oriented, normal mood ?CNS: Left-sided hemiplegia noted ?Skin: Warm and dry.  No rashes noted. ? ?Data Reviewed:  ? ?CBC: ?Recent Labs  ?Lab 01/01/22 ?2231 01/02/22 ?0600 01/03/22 ?0244  ?WBC 16.6* 13.9* 11.5*  ?NEUTROABS 13.4* 10.7*  --   ?HGB 11.9* 11.2* 9.5*  ?HCT 38.0 34.6* 30.4*  ?MCV 88.6 86.9 87.4  ?PLT 227 204 190  ? ? ?Basic Metabolic Panel: ?Recent Labs  ?Lab 01/01/22 ?2231 01/02/22 ?0600 01/03/22 ?0244  ?NA 131* 132* 134*  ?K 5.0 4.8 4.3  ?CL 97* 98 99  ?CO2 24 26 29   ?GLUCOSE 221* 202* 122*  ?BUN 23* 23* 18  ?CREATININE 1.36* 1.28* 1.19*  ?CALCIUM 9.0 8.8* 8.6*  ?MG  --   --  1.7  ? ? ?Liver Function Tests: ?Recent Labs  ?Lab 01/01/22 ?2231 01/02/22 ?0600 01/03/22 ?0244  ?AST 20 17 14*  ?ALT 17 16 11   ?ALKPHOS 84 81 75  ?BILITOT 0.6 0.7 0.5  ?PROT 7.4 6.6 6.1*  ?ALBUMIN 3.1* 2.7* 2.4*  ? ? ? ?Radiology Studies: ?CT Abdomen Pelvis W Contrast ? ?Result Date: 01/02/2022 ?CLINICAL DATA:  Flank pain. EXAM: CT ABDOMEN AND PELVIS WITH CONTRAST TECHNIQUE: Multidetector CT imaging of the abdomen and pelvis was performed using the standard protocol following bolus administration of intravenous contrast. RADIATION DOSE REDUCTION: This exam was performed according to the  departmental dose-optimization program which includes automated exposure control, adjustment of the mA and/or kV according to patient size and/or use of iterative reconstruction technique. CONTRAST:  161mL OMNIPAQUE IOHEXOL 300 MG/ML  SOLN COMPARISON:  None. FINDINGS: Lower chest: No acute abnormality. Hepatobiliary: There is diffuse fatty infiltration of the liver parenchyma. No focal liver abnormality is seen. No gallstones, gallbladder wall thickening, or biliary dilatation. Pancreas: Unremarkable. No pancreatic ductal dilatation or surrounding inflammatory changes. Spleen: Normal in size without focal abnormality. Adrenals/Urinary Tract: Adrenal glands are unremarkable. The right kidney is atrophic in appearance with multiple areas of cortical scarring noted on the right. A stable 1.7 cm x 0.8 cm cyst is seen along the anterolateral aspect  of the mid to upper right kidney. No additional follow-up or imaging is recommended. Mild right-sided perinephric inflammatory fat stranding is seen. This is very mildly increased in severity when compared to the prior study left kidney is normal, without renal calculi, focal lesion, or hydronephrosis. Bladder is unremarkable. Stomach/Bowel: Stomach is within normal limits. Appendix appears normal. No evidence of bowel wall thickening, distention, or inflammatory changes. Vascular/Lymphatic: Aortic atherosclerosis. Stable para caval lymphadenopathy is seen at the level of the left kidney. Reproductive: Uterus and bilateral adnexa are unremarkable. Other: No abdominal wall hernia or abnormality. No abdominopelvic ascites. Musculoskeletal: No acute or significant osseous findings. IMPRESSION: 1. Mild right-sided acute pyelonephritis. Correlation with urinalysis is recommended. 2. Hepatic steatosis. 3. Aortic atherosclerosis. Aortic Atherosclerosis (ICD10-I70.0). Electronically Signed   By: Virgina Norfolk M.D.   On: 01/02/2022 00:43  ? ?DG Chest Port 1 View ? ?Result Date:  01/01/2022 ?CLINICAL DATA:  Urinary tract infection, febrile, bilateral chest pain EXAM: PORTABLE CHEST 1 VIEW COMPARISON:  12/23/2021 FINDINGS: The heart size and mediastinal contours are within normal limits. B

## 2022-01-03 NOTE — Progress Notes (Signed)
?PROGRESS NOTE ? ? ? ?Karla Hoover  [id number] DOB: Jun 07, 1971 DOA: 01/01/2022 ?PCP: Patient, [name]  ? ? ?Brief Narrative:  ? ?Karla Hoover is a 51 y.o. female from skilled nursing facility with history of stroke with left-sided hemiplegia, pulmonary embolism, diastolic dysfunction, hypothyroidism, chronic kidney disease stage III, presented to the hospital with fever and chills with right flank pain.  In the ED, patient was hypotensive, tachycardic with a temperature of 100.8 ?F.  She had leukocytosis and lactic acidosis suggestive of severe sepsis.  Septic bolus was given and pancultures were sent and patient was started on broad-spectrum antibiotic.  CT scan of the abdomen pelvis showed acute pyelonephritis in the right side.   Chest x-ray was unremarkable.  ?  ?Assessment and Plan: ? ?Severe sepsis secondary to acute pyelonephritis ?CT scan of the abdomen showing mild right-sided acute pyelonephritis.  Continue IV antibiotics with Rocephin IV.  Blood cultures negative to date.    Leukocytosis has trended down to 11.5 from 16.6.Marland Kitchen   COVID and influenza was negative.  Urinalysis with moderate hemoglobin leukocytes nitrate negative, WBC 11-20.  Follow urine cultures..  Temperature max of 103 ?F. ?  ?Elevated lactate. Received septic bolus. On IV antibiotics, continue with normal saline for 1 more day.  Reassess need for fluids in AM. ?  ?History of hypertension  ?Continue ARB, metoprolol.  On as needed labetalol for accelerated hypertension.  Patient is on losartan hydrochlorothiazide at home. ?  ?Diabetes mellitus type 2 with hyperglycemia - ?Continue sliding scale, long-acting insulin, Accu-Cheks, diabetic diet.   ?  ?History of PE on Eliquis.   ?No acute issues at this time.  Continue Eliquis.  ?  ?Chronic kidney disease stage III  ?Creatinine of 1.1.  At baseline. ?  ?Anemia  ?Likely anemia of chronic disease.  Latest hemoglobin of 9.5, patient received IV fluids.  Could be component  of dilution.  No external blood loss ?  ?Hypothyroidism ?Continue Synthroid.  TSH of 1.3 ?  ?History of COPD.  Currently compensated.  Continue inhalers. ?  ?History of stroke with left-sided hemiplegia.   ?On statins and aspirin.  Continue Eliquis for PE.   ?  ?Mild hyponatremia.  We will continue to monitor.  Sodium of 134.  Receiving normal saline. ? ?Constipation.  We will put the patient on bowel regimen. ? ?Chest pain.  Appears atypical.  EKG unremarkable.  Troponin negative x1..  Reproducible tenderness on palpation.  We will add warm compress and diclofenac gel.  As needed nitroglycerin.  Will need better blood pressure control. ?  ? ? DVT prophylaxis:  ?apixaban (ELIQUIS) tablet 5 mg  ? ?Code Status:   ?  Code Status: Full Code ? ?Disposition: Skilled nursing facility ? ?Status is: Inpatient ? ?Remains inpatient appropriate because: Sepsis, IV antibiotic ? ? Family Communication:  ?Spoke with the patient's mother on the phone on 01/02/22 ? ?Consultants:  ?None ? ?Procedures:  ?None ? ?Antimicrobials:  ?Rocephin IV ? ?Anti-infectives (From admission, onward)  ? ? Start     Dose/Rate Route Frequency Ordered Stop  ? 01/02/22 0600  cefTRIAXone (ROCEPHIN) 2 g in sodium chloride 0.9 % 100 mL IVPB       ? 2 g ?200 mL/hr over 30 Minutes Intravenous Every 24 hours 01/02/22 0424 01/09/22 0559  ? 01/01/22 2245  ceFEPIme (MAXIPIME) 2 g in sodium chloride 0.9 % 100 mL IVPB       ? 2 g ?200 mL/hr over 30 Minutes  Intravenous  Once 01/01/22 2232 01/01/22 2311  ? ?  ? ?Subjective: ?Today, patient was seen and examined at bedside.  Patient did complain of a mild chest pain this morning.  Denies shortness of breath.  Feels like she is very constipated and has not moved her bowels in 4 days. ? ?Objective: ?Vitals:  ? 01/03/22 0700 01/03/22 0800 01/03/22 0936 01/03/22 1000  ?BP: (!) 164/107  (!) 171/84 (!) 164/89  ?Pulse:  (!) 110 (!) 105 (!) 106  ?Resp: 17 20  17   ?Temp:  (!) 101.2 ?F (38.4 ?C)    ?TempSrc:  Axillary    ?SpO2:   98%  91%  ?Weight:      ? ? ?Intake/Output Summary (Last 24 hours) at 01/03/2022 1056 ?Last data filed at 01/03/2022 0800 ?Gross per 24 hour  ?Intake 3300.85 ml  ?Output 2100 ml  ?Net 1200.85 ml  ? ? ?Filed Weights  ? 01/01/22 2214  ?Weight: 91.2 kg  ? ?Body mass index is 31.96 kg/m?.  ? ?Physical Examination: ? ?General: Obese built, not in obvious distress, alert awake and communicative. ?HENT:   No scleral pallor or icterus noted. Oral mucosa is moist.  ?Chest:  Clear breath sounds.  Diminished breath sounds bilaterally. No crackles or wheezes.  Left chest wall tenderness on palpation. ?CVS: S1 &S2 heard. No murmur.  Regular rate and rhythm. ?Abdomen: Soft, nontender, nondistended.  Bowel sounds are heard.   ?Extremities: No cyanosis, clubbing or edema.  Peripheral pulses are palpable. ?Psych: Alert, awake and oriented, normal mood ?CNS: Left-sided hemiplegia noted ?Skin: Warm and dry.  No rashes noted. ? ?Data Reviewed:  ? ?CBC: ?Recent Labs  ?Lab 01/01/22 ?2231 01/02/22 ?0600 01/03/22 ?0244  ?WBC 16.6* 13.9* 11.5*  ?NEUTROABS 13.4* 10.7*  --   ?HGB 11.9* 11.2* 9.5*  ?HCT 38.0 34.6* 30.4*  ?MCV 88.6 86.9 87.4  ?PLT 227 204 190  ? ? ? ?Basic Metabolic Panel: ?Recent Labs  ?Lab 01/01/22 ?2231 01/02/22 ?0600 01/03/22 ?0244  ?NA 131* 132* 134*  ?K 5.0 4.8 4.3  ?CL 97* 98 99  ?CO2 24 26 29   ?GLUCOSE 221* 202* 122*  ?BUN 23* 23* 18  ?CREATININE 1.36* 1.28* 1.19*  ?CALCIUM 9.0 8.8* 8.6*  ?MG  --   --  1.7  ? ? ? ?Liver Function Tests: ?Recent Labs  ?Lab 01/01/22 ?2231 01/02/22 ?0600 01/03/22 ?0244  ?AST 20 17 14*  ?ALT 17 16 11   ?ALKPHOS 84 81 75  ?BILITOT 0.6 0.7 0.5  ?PROT 7.4 6.6 6.1*  ?ALBUMIN 3.1* 2.7* 2.4*  ? ? ? ? ?Radiology Studies: ?CT Abdomen Pelvis W Contrast ? ?Result Date: 01/02/2022 ?CLINICAL DATA:  Flank pain. EXAM: CT ABDOMEN AND PELVIS WITH CONTRAST TECHNIQUE: Multidetector CT imaging of the abdomen and pelvis was performed using the standard protocol following bolus administration of intravenous  contrast. RADIATION DOSE REDUCTION: This exam was performed according to the departmental dose-optimization program which includes automated exposure control, adjustment of the mA and/or kV according to patient size and/or use of iterative reconstruction technique. CONTRAST:  138mL OMNIPAQUE IOHEXOL 300 MG/ML  SOLN COMPARISON:  None. FINDINGS: Lower chest: No acute abnormality. Hepatobiliary: There is diffuse fatty infiltration of the liver parenchyma. No focal liver abnormality is seen. No gallstones, gallbladder wall thickening, or biliary dilatation. Pancreas: Unremarkable. No pancreatic ductal dilatation or surrounding inflammatory changes. Spleen: Normal in size without focal abnormality. Adrenals/Urinary Tract: Adrenal glands are unremarkable. The right kidney is atrophic in appearance with multiple areas of cortical scarring noted  on the right. A stable 1.7 cm x 0.8 cm cyst is seen along the anterolateral aspect of the mid to upper right kidney. No additional follow-up or imaging is recommended. Mild right-sided perinephric inflammatory fat stranding is seen. This is very mildly increased in severity when compared to the prior study left kidney is normal, without renal calculi, focal lesion, or hydronephrosis. Bladder is unremarkable. Stomach/Bowel: Stomach is within normal limits. Appendix appears normal. No evidence of bowel wall thickening, distention, or inflammatory changes. Vascular/Lymphatic: Aortic atherosclerosis. Stable para caval lymphadenopathy is seen at the level of the left kidney. Reproductive: Uterus and bilateral adnexa are unremarkable. Other: No abdominal wall hernia or abnormality. No abdominopelvic ascites. Musculoskeletal: No acute or significant osseous findings. IMPRESSION: 1. Mild right-sided acute pyelonephritis. Correlation with urinalysis is recommended. 2. Hepatic steatosis. 3. Aortic atherosclerosis. Aortic Atherosclerosis (ICD10-I70.0). Electronically Signed   By: Virgina Norfolk M.D.   On: 01/02/2022 00:43  ? ?DG Chest Port 1 View ? ?Result Date: 01/01/2022 ?CLINICAL DATA:  Urinary tract infection, febrile, bilateral chest pain EXAM: PORTABLE CHEST 1 VIEW COMPARISON:  12/23/2021

## 2022-01-04 DIAGNOSIS — T7840XA Allergy, unspecified, initial encounter: Secondary | ICD-10-CM

## 2022-01-04 DIAGNOSIS — A4151 Sepsis due to Escherichia coli [E. coli]: Secondary | ICD-10-CM

## 2022-01-04 LAB — GLUCOSE, CAPILLARY
Glucose-Capillary: 161 mg/dL — ABNORMAL HIGH (ref 70–99)
Glucose-Capillary: 120 mg/dL — ABNORMAL HIGH (ref 70–99)
Glucose-Capillary: 230 mg/dL — ABNORMAL HIGH (ref 70–99)

## 2022-01-04 LAB — COMPREHENSIVE METABOLIC PANEL
ALT: 12 U/L (ref 0–44)
AST: 22 U/L (ref 15–41)
Albumin: 2.4 g/dL — ABNORMAL LOW (ref 3.5–5.0)
Alkaline Phosphatase: 86 U/L (ref 38–126)
Anion gap: 8 (ref 5–15)
BUN: 13 mg/dL (ref 6–20)
CO2: 25 mmol/L (ref 22–32)
Calcium: 8.3 mg/dL — ABNORMAL LOW (ref 8.9–10.3)
Chloride: 101 mmol/L (ref 98–111)
Creatinine, Ser: 1.01 mg/dL — ABNORMAL HIGH (ref 0.44–1.00)
GFR, Estimated: >60 mL/min (ref >60)
Glucose, Bld: 176 mg/dL — ABNORMAL HIGH (ref 70–99)
Potassium: 4.7 mmol/L (ref 3.5–5.1)
Sodium: 134 mmol/L — ABNORMAL LOW (ref 135–145)
Total Bilirubin: 0.8 mg/dL (ref 0.3–1.2)
Total Protein: 6.3 g/dL — ABNORMAL LOW (ref 6.5–8.1)

## 2022-01-04 LAB — CBC
HCT: 32.2 % — ABNORMAL LOW (ref 36.0–46.0)
Hemoglobin: 10.1 g/dL — ABNORMAL LOW (ref 12.0–15.0)
MCH: 27.5 pg (ref 26.0–34.0)
MCHC: 31.4 g/dL (ref 30.0–36.0)
MCV: 87.7 fL (ref 80.0–100.0)
Platelets: ADEQUATE 10*3/uL (ref 150–400)
RBC: 3.67 MIL/uL — ABNORMAL LOW (ref 3.87–5.11)
RDW: 13.9 % (ref 11.5–15.5)
WBC: 7.1 10*3/uL (ref 4.0–10.5)
nRBC: 0 % (ref 0.0–0.2)

## 2022-01-04 LAB — URINE CULTURE: Culture: 4000 — AB

## 2022-01-04 LAB — MAGNESIUM: Magnesium: 1.7 mg/dL (ref 1.7–2.4)

## 2022-01-04 LAB — LACTIC ACID, PLASMA: Lactic Acid, Venous: 1.1 mmol/L (ref 0.5–1.9)

## 2022-01-04 MED ORDER — SODIUM CHLORIDE 0.9 % IV SOLN
1.0000 g | INTRAVENOUS | Status: DC
  Administered 2022-01-04: 1000 mg via INTRAVENOUS
  Filled 2022-01-04 (×2): qty 1

## 2022-01-04 MED ORDER — SODIUM CHLORIDE 0.9 % IV SOLN: 2.0000 g | INTRAVENOUS | Status: DC

## 2022-01-04 MED ORDER — DIPHENHYDRAMINE HCL 25 MG PO CAPS: 25.0000 mg | ORAL_CAPSULE | Freq: Four times a day (QID) | ORAL | Status: DC | PRN

## 2022-01-04 MED ORDER — CIPROFLOXACIN HCL 250 MG PO TABS
250.0000 mg | ORAL_TABLET | Freq: Once | ORAL | Status: AC
  Administered 2022-01-04: 250 mg via ORAL
  Filled 2022-01-04: qty 1

## 2022-01-04 MED ORDER — HYDROCHLOROTHIAZIDE 25 MG PO TABS
25.0000 mg | ORAL_TABLET | Freq: Every day | ORAL | Status: DC
  Administered 2022-01-04 – 2022-01-05 (×2): 25 mg via ORAL
  Filled 2022-01-04 (×3): qty 1

## 2022-01-04 NOTE — Progress Notes (Signed)
?Progress Note ? ? ?Patient: Karla Hoover [id number] DOB: 1971-02-22 DOA: 01/01/2022     2 ?DOS: the patient was seen and examined on 01/04/2022 ?  ?Brief hospital course: ?Karla Hoover is a 51 y.o. female from skilled nursing facility with history of stroke with left-sided hemiplegia, pulmonary embolism, diastolic dysfunction, hypothyroidism, chronic kidney disease stage III, presented to the hospital with fever and chills with right flank pain.  In the ED, patient was hypotensive, tachycardic with a temperature of 100.8 ?F.  She had leukocytosis and lactic acidosis suggestive of severe sepsis.  Septic bolus was given and pancultures were sent and patient was started on broad-spectrum antibiotic.  CT scan of the abdomen pelvis showed acute pyelonephritis in the right side. ? ? ?Assessment and Plan: ? ?Severe sepsis secondary to acute pyelonephritis ?CT scan of the abdomen showing mild right-sided acute pyelonephritis.  Continue ceftriaxone.  Blood cultures negative to date.  Leukocytosis resolved.  Urine culture growing E. coli.  Sensitivities and susceptibilities pending.  No fevers.  No tachycardia ?  ?Elevated lactate.   ?Resolved ?  ?Essential hypertension ?Continue home losartan/HCTZ.  Continue metoprolol. On as needed labetalol for accelerated hypertension.   ? ?Diabetes mellitus type 2 with hyperglycemia - ?Continue sliding scale, long-acting insulin, Accu-Cheks, diabetic diet.   ?  ?History of PE on Eliquis.   ?Continue Eliquis.  ?  ?Chronic kidney disease stage III  ?Creatinine of 1.1.  At baseline. ?  ?Anemia  ?Likely anemia of chronic disease.  Stable.  Last hemoglobin 10.1. Patient received IV fluids.  Could be component of dilution.  No external blood loss ?  ?Hypothyroidism ?Continue Synthroid.  TSH of 1.3.  Well-controlled. ?  ?History of COPD.  Not in acute exacerbation.  Continue inhalers. ?  ?History of stroke with left-sided hemiplegia.   ?On statins and aspirin.  Continue Eliquis for  PE.   ?  ?Constipation.  Continue bowel regimen. ?  ?Chest pain.  Resolved.  Appears atypical.  EKG unremarkable.  Troponin negative x1..  Reproducible tenderness on palpation.  Continue warm compress and diclofenac gel.  As needed nitroglycerin.  ? ?Subjective: No acute events reported by nursing staff overnight.  Hemodynamics are stable.  No fevers.  Blood pressure still uncontrolled.  Will resume home hydrochlorothiazide.  Stable to transfer down from stepdown unit to MedSurg ? ?Physical Exam: ?Vitals:  ? 01/04/22 0405 01/04/22 0504 01/04/22 0700 01/04/22 0800  ?BP: (!) 180/96 (!) 158/81 (!) 172/90 (!) 143/70  ?Pulse: 89  90 89  ?Resp: 16  16 17   ?Temp: 98.5 ?F (36.9 ?C)   (!) 97.5 ?F (36.4 ?C)  ?TempSrc: Axillary   Oral  ?SpO2: 94%  94% 98%  ?Weight:      ? ?General: Obese built, not in obvious distress, alert awake and communicative. ?HENT:   No scleral pallor or icterus noted. Oral mucosa is moist.  ?Chest:  Clear breath sounds.  Diminished breath sounds bilaterally. No crackles or wheezes.  ?CVS: S1 &S2 heard. No murmur.  Regular rate and rhythm. ?Abdomen: Soft, nontender, nondistended.  Bowel sounds are heard.   ?Extremities: No cyanosis, clubbing or edema.  Peripheral pulses are palpable. ?Psych: Alert, awake and oriented, normal mood ?CNS: Left-sided hemiplegia noted ?Skin: Warm and dry.  No rashes noted. ? ?Data Reviewed: ?Labs, microbiology, vital signs ? ?Family Communication: No family present at bedside ? ?Disposition: ?Status is: Inpatient ?Remains inpatient appropriate because: Awaiting urine culture sensitivities and susceptibilities ?Planned Discharge Destination: Skilled nursing facility ?DVT  prophylaxis: Eliquis ? ?Time spent: 35 minutes ? ?Author: ?Leslee Home, DO ?01/04/2022 9:41 AM ? ?For on call review [email or web address].  ?

## 2022-01-04 NOTE — Consult Note (Addendum)
? ? ?[facility]  ?                                                                                     ? ?Patient Identification: ?Patient Name: Karla Hoover MRN: [id number] [place] Date: 01/01/2022 10:00 PM ?Today's Date: 01/04/2022 ?Reason for consult: Pyelonephritis  ?Requesting provider: Imagene Sheller ? ?Principal Problem: ?  Sepsis ([place]) ?Active Problems: ?  Essential hypertension ?  COPD with chronic bronchitis ([place]) ?  History of thromboembolism ?  Type 2 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 disease, with long-term current use of insulin ([place]) ?  Hypothyroidism ?  Grade I diastolic dysfunction ?  Acute pyelonephritis ? ? ?Antibiotics:  ?Cefepime 3/28 ?Ceftriaxone 3/28-c ? ?Lines/Hardware: ? ?Assessment ?RT sided Pyelonephritis ( MDR E coli) ? ?Allergy to Levofloxacin - She had an allergic reaction to Levaquin in 2007 ( breaking out in face, SOB and itching). Patient and her mother wants to re-trial ? ?AKI on CKD - Cr improving  ?Leukocytosis - resolved  ? ?Recommendations  ?Will switch abtx to IV ertapenem for now  ? ?Trial one dose of PO levaquin 250mg  and observe for 24 hrs. If no concerns in 24 hrs, can be switched to PO levaquin to complete 10 days of tx ( IV and PO). If any issues with tolerance, will need IV ertapenem via midline to complete 10 days tx.  ? ?ID available as needed. Please call with questions  ? ?Rest of the management as per the primary team. Please call with questions or concerns.  ?Thank you for the consult ? ?[name], MD ?Infectious Disease Physician ?[facility] ?[name] ?[place], [place] [address] ?Phone: [phone]  Fax: [phone] ? ?__________________________________________________________________________________________________________ ?HPI and Hospital Course: ?51 YO Female with h/o Asthma/COPD,  CKD,  DM,  HTN, GERD. HLD, Hypothyroidism, CVA s/p left sided weakness, PE on AC who presented to ED on 3/28 from [name] with fevers/chills,  bilateral rib pain, generalized weakness, dysuria, increased frequency of urination. She has ecently received a course of augmentin on 2/27 for 5 days followed by Macrobid for 5 days since 3/17 for ?UTI ? ?In ED, hypotensive, tachycardic and febrile ?Labs remarkable for leukocytosis and lactic acidosis ?UA with multiple WBCs ?CT abd/pelvis concerning for rt sided pyelonephritis  ? ?She had an allergic reaction to Levaquin in 2007 ( breaking out in face, SOB and itching). Patient and her mother wants to re-trial ?Denies h/o  kidney stones and any ureteral stent  ? ?ROS: all systems reviewed and negative except as stated above ? ?Past Medical History:  ?Diagnosis Date  ? Asthma   ? Chronic kidney disease, stage 3a ([name]) 02/09/2021  ? Chronic respiratory failure with hypoxia ([name]) 02/09/2021  ? COPD (chronic obstructive pulmonary disease) ([name])   ? COPD with chronic bronchitis ([name]) 05/04/2009  ? Former smoker 36 pack year smoking history    ? Diabetes mellitus without complication ([name])   ? Essential hypertension 05/04/2009  ? Qualifier: Diagnosis of  By: Quentin Cornwall CMA, Janett Billow    ? GERD without esophagitis 02/09/2021  ? Grade I  diastolic dysfunction [id number]  ? Hypertension   ? Hypothyroidism 02/09/2021  ? Mixed 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 ([name]) 02/09/2021  ? Pulmonary embolism ([name]) 11/16/2019  ? Stroke [facility])   ? Type 2 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 disease, with long-term current use of insulin ([name]) 02/09/2021  ? ?History reviewed. No pertinent surgical history. ? ? ?Scheduled Meds: ? apixaban  5 mg Oral BID  ? aspirin  81 mg Oral Daily  ? atorvastatin  80 mg Oral QPM  ? Chlorhexidine Gluconate Cloth  6 each Topical Daily  ? diclofenac Sodium  2 g Topical QID  ? docusate sodium  100 mg Oral BID  ? folic acid  1 mg Oral Daily  ?  gabapentin  300 mg Oral TID  ? hydrochlorothiazide  25 mg Oral Daily  ? insulin aspart  0-9 Units Subcutaneous TID WC  ? insulin glargine-yfgn  22 Units Subcutaneous Daily  ? levothyroxine  75 mcg Oral QAC breakfast  ? lidocaine  1 patch Transdermal Q24H  ? losartan  100 mg Oral Daily  ? melatonin  5 mg Oral QHS  ? metoprolol tartrate  25 mg Oral BID  ? mometasone-formoterol  2 puff Inhalation BID  ? pantoprazole  40 mg Oral Daily  ? polyethylene glycol  17 g Oral Daily  ? umeclidinium bromide  1 puff Inhalation q morning  ? vitamin B-12  500 mcg Oral Daily  ? ?Continuous Infusions: ? sodium chloride 75 mL/hr at 01/03/22 2240  ? [START ON 01/05/2022] cefTRIAXone (ROCEPHIN)  IV    ? ?PRN Meds:.acetaminophen **OR** acetaminophen, albuterol, bisacodyl, cyclobenzaprine, labetalol, nitroGLYCERIN ? ?Allergies  ?Allergen Reactions  ? Guaifenesin Anaphylaxis  ? Kiwi Extract Anaphylaxis and Rash  ? Levaquin [Levofloxacin In D5w] Shortness Of Breath and Rash  ? Strawberry Extract Anaphylaxis and Rash  ? Baclofen Other (See Comments)  ?  Near syncope/ fall  ? ?Social History  ? ?Socioeconomic History  ? Marital status: Single  ?  Spouse name: Not on file  ? Number of children: Not on file  ? Years of education: Not on file  ? Highest education level: Not on file  ?Occupational History  ? Not on file  ?Tobacco Use  ? Smoking status: Former  ?  Packs/day: 0.50  ?  Years: 36.00  ?  Pack years: 18.00  ?  Types: Cigarettes  ?  Start date: 10/08/1983  ?  Quit date: 11/22/2019  ?  Years since quitting: 2.1  ? Smokeless tobacco: Never  ?Vaping Use  ? Vaping Use: Never used  ?Substance and Sexual Activity  ? Alcohol use: No  ? Drug use: No  ? Sexual activity: Not on file  ?Other Topics Concern  ? Not on file  ?Social History Narrative  ? Not on file  ? ?Social Determinants of Health  ? ?Financial Resource Strain: Not on file  ?Food Insecurity: Not on file  ?Transportation Needs: Not on file  ?Physical Activity: Not on file  ?Stress: Not on  file  ?Social Connections: Not on file  ?Intimate Partner Violence: Not on file  ? ?Breast Cancer-relatedfamily history is not on file. ? ?Vitals ? BP (!) 143/70 (BP Location: Right Arm)   Pulse 89   Temp (!) 97.5 ?F (36.4 ?C) (Oral)   Resp 17   Wt 91.2 kg   LMP 10/20/2019   SpO2 98%   BMI 31.96 kg/m?  ? ?Physical Exam ?Constitutional:  sitting up in bed and  appears comfortable  ?   Comments:  ? ?Cardiovascular:  ?   Rate and Rhythm: Normal rate and regular rhythm.  ?   Heart sounds: ? ?Pulmonary:  ?   Effort: Pulmonary effort is normal on room air  ?   Comments:  ? ?Abdominal:  ?   Palpations: Abdomen is soft.  ?   Tenderness: Non tender and non distended  ? ?Musculoskeletal:     ?   General: No swelling or tenderness. ? ?Skin: ?   Comments:  ? ?Neurological:  ?   General: Left sided weakness. Awake, alert and oriented and following commands  ? ?Psychiatric:     ?   Mood and Affect: Mood normal.  ? ? ?Pertinent Microbiology ?Results for orders placed or performed during the hospital encounter of 01/01/22  ?Urine Culture     Status: Abnormal  ? Collection Time: 01/01/22  2:35 AM  ? Specimen: In/Out Cath Urine  ?Result Value Ref Range Status  ? Specimen Description   Final  ?  IN/OUT CATH URINE ?Performed at [facility], [place] [address] [place], [place] [address] ?  ? Special Requests   Final  ?  NONE ?Performed at [facility], [place] [address] [place], [place] [address] ?  ? Culture 4,000 COLONIES/mL ESCHERICHIA COLI (A)  Final  ? Report Status 01/04/2022 FINAL  Final  ? Organism ID, Bacteria ESCHERICHIA COLI (A)  Final  ?    Susceptibility  ? Escherichia coli - MIC*  ?  AMPICILLIN >=32 RESISTANT Resistant   ?  CEFAZOLIN >=64 RESISTANT Resistant   ?  CEFEPIME 16 RESISTANT Resistant   ?  CEFTRIAXONE >=64 RESISTANT Resistant   ?  CIPROFLOXACIN <=0.25 SENSITIVE Sensitive   ?  GENTAMICIN >=16 RESISTANT Resistant   ?  IMIPENEM <=0.25 SENSITIVE Sensitive   ?  NITROFURANTOIN  <=16 SENSITIVE Sensitive   ?  TRIMETH/SULFA >=320 RESISTANT Resistant   ?  AMPICILLIN/SULBACTAM >=32 RESISTANT Resistant   ?  PIP/TAZO <=4 SENSITIVE Sensitive   ?  * 4,000 COLONIES/mL ESCHERICHIA COLI  ?Resp Panel by R

## 2022-01-04 NOTE — TOC Initial Note (Signed)
Transition of Care (TOC) - Initial/Assessment Note  ? ? ?Patient Details  ?Name: Karla Hoover ?MRN: [id number] ?Date of Birth: 1970/12/19 ? ?Transition of Care (TOC) CM/SW Contact:    ?Tawanna Cooler, RN ?Phone Number: ?01/04/2022, 2:26 PM ? ?Clinical Narrative:                 ? ?Patient is a resident of [facility] as [facility] term [facility].  Plan is to return once medically stable.  ? ?Expected Discharge Plan: [place] ?Barriers to Discharge: Continued Medical Work up ? ?Expected Discharge Plan and Services ?Expected Discharge Plan: [place] ?  ?  ?Post Acute Care Choice: [place] ?Living arrangements for the past 2 months: [place] ?                ?  ? ?Prior Living Arrangements/Services ?Living arrangements for the past 2 months: [place] ?Lives with:: Facility Resident ?Patient language and need for interpreter reviewed:: Yes ?       ?Need for Family Participation in Patient Care: Yes (Comment) ?Care giver support system in place?: Yes (comment) ?  ?Criminal Activity/Legal Involvement Pertinent to Current Situation/Hospitalization: No - Comment as needed ? ?Activities of Daily Living ?Home Assistive Devices/Equipment: Wheelchair ?ADL Screening (condition at time of admission) ?Patient's cognitive ability adequate to safely complete daily activities?: Yes ?Is the patient deaf or have difficulty hearing?: No ?Does the patient have difficulty seeing, even when wearing glasses/contacts?: No ?Does the patient have difficulty concentrating, remembering, or making decisions?: No ?Patient able to express need for assistance with ADLs?: Yes ?Does the patient have difficulty dressing or bathing?: Yes ?Independently performs ADLs?: No ?Communication: Independent ?Dressing (OT): Needs assistance ?Is this a change from baseline?: Pre-admission baseline ?Grooming: Needs assistance ?Is this a change from baseline?: Pre-admission  baseline ?Feeding: Independent ?Bathing: Dependent ?Is this a change from baseline?: Pre-admission baseline ?Toileting: Needs assistance ?Is this a change from baseline?: Pre-admission baseline ?In/Out Bed: Needs assistance ?Is this a change from baseline?: Pre-admission baseline ?Walks in Home: Dependent ?Is this a change from baseline?: Pre-admission baseline ?Does the patient have difficulty walking or climbing stairs?: Yes ?Weakness of Legs: Left ?Weakness of Arms/Hands: Left ? ?  ?Alcohol / Substance Use: Not Applicable ?Psych Involvement: No (comment) ? ?Admission diagnosis:  Pyelonephritis [N12] ?Sepsis ([place]) [A41.9] ?Sepsis, due to unspecified organism, unspecified whether acute organ dysfunction present ([place]) [A41.9] ?Patient Active Problem List  ? Diagnosis Date Noted  ? Sepsis ([place]) 01/02/2022  ? Acute pyelonephritis 01/02/2022  ? Sepsis due to undetermined organism ([place]) 12/02/2021  ? Grade I diastolic dysfunction [id number]  ? Mild protein-calorie malnutrition ([place]) 12/02/2021  ? COPD with acute exacerbation ([place]) 12/02/2021  ? Chest pain 05/29/2021  ? Acute renal failure superimposed on stage 3a chronic kidney disease ([place]) 02/09/2021  ? Chronic respiratory failure with hypoxia ([place]) 02/09/2021  ? History of thromboembolism 02/09/2021  ? Type 2 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 disease, with long-term current use of insulin ([place]) 02/09/2021  ? Mixed 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 ([place]) 02/09/2021  ? GERD without esophagitis 02/09/2021  ? Hypothyroidism 02/09/2021  ? Pyelonephritis of right kidney 02/09/2021  ? Sepsis secondary to UTI ([place]) 02/08/2021  ? Lymphadenopathy 12/28/2020  ? VTE (venous thromboembolism) 12/01/2019  ? Overweight (BMI 25.0-29.9) 11/22/2019  ? Chronic constipation 11/22/2019  ? Tobacco abuse 11/16/2019  ? Elevated troponin 11/16/2019  ? Bilateral pulmonary embolism ([place]) 11/15/2019  ?  DM (diabetes mellitus), type 2 ([place]) 11/15/2019  ?  History of stroke 11/15/2019  ? Essential hypertension 05/04/2009  ? COPD with chronic bronchitis ([place]) 05/04/2009  ? ?PCP:  Patient, [name] ?Pharmacy:  No Pharmacies Listed ? ?Readmission Risk Interventions ? ?  01/04/2022  ?  2:25 PM 12/03/2021  ?  9:55 AM  ?Readmission Risk Prevention Plan  ?Transportation Screening Complete Complete  ?PCP or Specialist Appt within 3-5 Days Complete Complete  ?[place] or Home Care Consult Complete Complete  ?Social Work Consult for [place] Planning/Counseling  Complete  ?Palliative Care Screening Not Applicable Complete  ?Medication Review Press photographer) Complete Complete  ? ? ? ?

## 2022-01-05 LAB — GLUCOSE, CAPILLARY
Glucose-Capillary: 215 mg/dL — ABNORMAL HIGH (ref 70–99)
Glucose-Capillary: 264 mg/dL — ABNORMAL HIGH (ref 70–99)
Glucose-Capillary: 234 mg/dL — ABNORMAL HIGH (ref 70–99)
Glucose-Capillary: 244 mg/dL — ABNORMAL HIGH (ref 70–99)

## 2022-01-05 MED ORDER — CIPROFLOXACIN HCL 500 MG PO TABS
500.0000 mg | ORAL_TABLET | Freq: Two times a day (BID) | ORAL | Status: DC
  Administered 2022-01-05 – 2022-01-06 (×2): 500 mg via ORAL
  Filled 2022-01-05 (×2): qty 1

## 2022-01-05 MED ORDER — AMLODIPINE BESYLATE 5 MG PO TABS
5.0000 mg | ORAL_TABLET | Freq: Every day | ORAL | Status: DC
  Administered 2022-01-05: 5 mg via ORAL
  Filled 2022-01-05: qty 1

## 2022-01-05 NOTE — Progress Notes (Addendum)
?Progress Note ? ? ?Patient: Karla Hoover [id number] DOB: [birth year] DOA: 01/01/2022     3 ?DOS: the patient was seen and examined on 01/05/2022 ?  ?Brief hospital course: ?Karla Hoover is a 51 y.o. female from skilled nursing facility with history of stroke with left-sided hemiplegia, pulmonary embolism, diastolic dysfunction, hypothyroidism, chronic kidney disease stage III, presented to the hospital with fever and chills with right flank pain.  In the ED, patient was hypotensive, tachycardic with a temperature of 100.8 ?F.  She had leukocytosis and lactic acidosis suggestive of severe sepsis.  Septic bolus was given and pancultures were sent and patient was started on broad-spectrum antibiotic.  CT scan of the abdomen pelvis showed acute pyelonephritis in the right side. ? ? ?Assessment and Plan: ? ?Severe sepsis secondary to acute pyelonephritis ?CT scan of the abdomen showing mild right-sided acute pyelonephritis.  Continue ceftriaxone.  Blood cultures negative to date.  Leukocytosis resolved.  Urine culture growing E. coli.  S/S shows MDR E.Coli.  No fevers.  No tachycardia. ID trialed dose of cipro yesterday given allergy and started ertapenem.  No issues with tolerance and will discontinue ertapenem and continue with ciprofloxacin. ?  ?Elevated lactate.   ?Resolved ?  ?Essential hypertension ?Uncontrolled.  Start amlodipine 5 mg daily.  Continue home losartan/HCTZ.  Continue metoprolol. On as needed labetalol for accelerated hypertension.   ? ?Diabetes mellitus type 2 with hyperglycemia - ?Continue sliding scale, long-acting insulin, Accu-Cheks, diabetic diet.   ?  ?History of PE on Eliquis.   ?Continue Eliquis.  ?  ?Chronic kidney disease stage III  ?Creatinine of 1.0.  At baseline. ?  ?Anemia  ?Likely anemia of chronic disease.  Stable.  Last hemoglobin 10.1. Patient received IV fluids.  Could be component of dilution.  No external blood loss ?  ?Hypothyroidism ?Continue Synthroid.  TSH of 1.3.   Well-controlled. ?  ?History of COPD.  Not in acute exacerbation.  Continue inhalers. ?  ?History of stroke with left-sided hemiplegia.   ?On statins and aspirin.  Continue Eliquis for PE.   ?  ?Constipation.  Continue bowel regimen. ?  ?Chest pain.  Resolved.  Appears atypical.  EKG unremarkable.  Troponin negative x1..  Reproducible tenderness on palpation.  Continue warm compress and diclofenac gel.  As needed nitroglycerin.  ? ?Subjective: No acute events reported by nursing staff overnight.  Hemodynamics are stable.  No fevers.  Blood pressure still uncontrolled.  No allergic reaction with trial of Cipro yesterday. ? ?Physical Exam: ?Vitals:  ? 01/05/22 0515 01/05/22 0611 01/05/22 1000 01/05/22 1349  ?BP: (!) 170/99 (!) 161/84 (!) 154/93 (!) 143/78  ?Pulse: 81 89 82 81  ?Resp: 18   16  ?Temp: 98.2 ?F (36.8 ?C)   97.8 ?F (36.6 ?C)  ?TempSrc: Oral   Oral  ?SpO2: 94%  92% 91%  ?Weight:      ? ?General: Obese built, not in obvious distress, alert awake and communicative. ?HENT:   No scleral pallor or icterus noted. Oral mucosa is moist.  ?Chest:  Clear breath sounds.  Diminished breath sounds bilaterally. No crackles or wheezes.  ?CVS: S1 &S2 heard. No murmur.  Regular rate and rhythm. ?Abdomen: Soft, nontender, nondistended.  Bowel sounds are heard.   ?Extremities: No cyanosis, clubbing or edema.  Peripheral pulses are palpable. ?Psych: Alert, awake and oriented, normal mood ?CNS: Left-sided hemiplegia noted ?Skin: Warm and dry.  No rashes noted. ? ?Data Reviewed: ?Labs, microbiology, vital signs ? ?Family Communication: No family  present at bedside ? ?Disposition: ?Status is: Inpatient ?Remains inpatient appropriate because: Trial of Cipro given previous allergy ?Planned Discharge Destination: Skilled nursing facility ?DVT prophylaxis: Eliquis ? ? ?Time spent: 55 minutes ? ?Author: ?Leslee Home, DO ?01/05/2022 2:55 PM ? ?For on call review [email or web address].  ?

## 2022-01-06 LAB — CULTURE, BLOOD (ROUTINE X 2)
Culture: NO GROWTH
Culture: NO GROWTH
Special Requests: ADEQUATE

## 2022-01-06 LAB — GLUCOSE, CAPILLARY
Glucose-Capillary: 188 mg/dL — ABNORMAL HIGH (ref 70–99)
Glucose-Capillary: 204 mg/dL — ABNORMAL HIGH (ref 70–99)

## 2022-01-06 MED ORDER — CIPROFLOXACIN HCL 500 MG PO TABS: 500.0000 mg | ORAL_TABLET | Freq: Two times a day (BID) | ORAL | 0 refills | Status: AC

## 2022-01-06 MED ORDER — AMLODIPINE BESYLATE 10 MG PO TABS
10.0000 mg | ORAL_TABLET | Freq: Every day | ORAL | Status: DC
  Filled 2022-01-06: qty 1

## 2022-01-06 NOTE — [place] FL2 (Signed)
?[place] MEDICAID FL2 LEVEL OF CARE SCREENING TOOL  ?  ? ?IDENTIFICATION  ?Patient Name: ?Karla Hoover Birthdate: 1971-01-30 Sex: female Admission Date (Current Location): ?01/01/2022  ?[place] and [place] Number: ? [place] ?  Facility and Address:  ?[facility],  [place], [place] ?     Provider Number: ?[id number]  ?Attending Physician Name and Address:  ?Leslee Home, DO ? Relative Name and Phone Number:  ?Versie Starks, Mother ?   ?Current Level of Care: ?Hospital Recommended Level of Care: ?[place] (long term care at [facility]) Prior Approval Number: ?  ? ?Date Approved/Denied: ?  PASRR Number: ?  ? ?Discharge Plan: ?SNF ?  ? ?Current Diagnoses: ?Patient Active Problem List  ? Diagnosis Date Noted  ? Allergies   ? Sepsis ([place]) 01/02/2022  ? Acute pyelonephritis 01/02/2022  ? Sepsis due to undetermined organism ([place]) 12/02/2021  ? Grade I diastolic dysfunction [id number]  ? Mild protein-calorie malnutrition ([place]) 12/02/2021  ? COPD with acute exacerbation ([place]) 12/02/2021  ? Chest pain 05/29/2021  ? Acute renal failure superimposed on stage 3a chronic kidney disease ([place]) 02/09/2021  ? Chronic respiratory failure with hypoxia ([place]) 02/09/2021  ? History of thromboembolism 02/09/2021  ? Type 2 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 disease, with long-term current use of insulin ([place]) 02/09/2021  ? Mixed 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 ([place]) 02/09/2021  ? GERD without esophagitis 02/09/2021  ? Hypothyroidism 02/09/2021  ? Pyelonephritis of right kidney 02/09/2021  ? Sepsis secondary to UTI ([place]) 02/08/2021  ? Lymphadenopathy 12/28/2020  ? VTE (venous thromboembolism) 12/01/2019  ? Overweight (BMI 25.0-29.9) 11/22/2019  ? Chronic constipation 11/22/2019  ? Tobacco abuse 11/16/2019  ? Elevated troponin 11/16/2019  ? Bilateral pulmonary embolism ([place]) 11/15/2019  ? DM (diabetes mellitus), type 2 ([place]) 11/15/2019  ?  History of stroke 11/15/2019  ? Essential hypertension 05/04/2009  ? COPD with chronic bronchitis ([place]) 05/04/2009  ? ? ?Orientation RESPIRATION BLADDER Height & Weight   ?  ?Self, Time, Situation ? Normal Indwelling catheter Weight: 91.2 kg ?Height:     ?BEHAVIORAL SYMPTOMS/MOOD NEUROLOGICAL BOWEL NUTRITION STATUS  ?    Continent Diet (heart healthy)  ?AMBULATORY STATUS COMMUNICATION OF NEEDS Skin   ?Limited Assist Verbally Normal ?  ?  ?  ?    ?     ?     ? ? ?Personal Care Assistance Level of Assistance  ?Bathing, Feeding, Dressing Bathing Assistance: Limited assistance ?Feeding assistance: Independent ?Dressing Assistance: Limited assistance ?   ? ?Functional Limitations Info  ?Sight, Hearing, Speech Sight Info: Adequate ?Hearing Info: Adequate ?Speech Info: Adequate  ? ? ?SPECIAL CARE FACTORS FREQUENCY  ?    ?  ?  ?  ?  ?  ?  ?   ? ? ?Contractures Contractures Info: Not present  ? ? ?Additional Factors Info  ?Allergies, Code Status Code Status Info: Full ?Allergies Info: Guafenisin, kiwi, levaquin, strawberry, baclofen ?  ?  ?  ?   ? ?Current Medications (01/06/2022):  This is the current hospital active medication list ?Current Facility-Administered Medications  ?Medication Dose Route Frequency Provider Last Rate Last Admin  ? acetaminophen (TYLENOL) tablet 650 mg  650 mg Oral Q6H PRN Rise Patience, MD   650 mg at 01/05/22 0615  ? Or  ? acetaminophen (TYLENOL) suppository 650 mg  650 mg Rectal Q6H PRN Rise Patience, MD      ? albuterol (PROVENTIL) (2.5 MG/3ML)  0.083% nebulizer solution 2.5 mg  2.5 mg Inhalation Q6H PRN Rise Patience, MD      ? apixaban Arne [place]) tablet 5 mg  5 mg Oral BID Rise Patience, MD   5 mg at 01/06/22 0857  ? aspirin chewable tablet 81 mg  81 mg Oral Daily Rise Patience, MD   81 mg at 01/06/22 [id number]  ? atorvastatin (LIPITOR) tablet 80 mg  80 mg Oral QPM Rise Patience, MD   80 mg at 01/05/22 1618  ? bisacodyl (DULCOLAX) suppository 10 mg  10 mg  Rectal Daily PRN Pokhrel, Laxman, MD      ? Chlorhexidine Gluconate Cloth 2 % PADS 6 each  6 each Topical Daily Rise Patience, MD   6 each at 01/04/22 [phone]  ? ciprofloxacin (CIPRO) tablet 500 mg  500 mg Oral BID Imagene Sheller S, DO   500 mg at 01/06/22 0857  ? cyclobenzaprine (FLEXERIL) tablet 10 mg  10 mg Oral TID PRN Rise Patience, MD   10 mg at 01/04/22 0810  ? diclofenac Sodium (VOLTAREN) 1 % topical gel 2 g  2 g Topical QID Pokhrel, Laxman, MD   2 g at 01/06/22 0900  ? diphenhydrAMINE (BENADRYL) capsule 25 mg  25 mg Oral Q6H PRN [name], MD      ? docusate sodium (COLACE) capsule 100 mg  100 mg Oral BID Pokhrel, Laxman, MD   100 mg at 01/06/22 0857  ? folic acid (FOLVITE) tablet 1 mg  1 mg Oral Daily Rise Patience, MD   1 mg at 01/06/22 0857  ? gabapentin (NEURONTIN) capsule 300 mg  300 mg Oral TID Rise Patience, MD   300 mg at 01/06/22 0857  ? hydrochlorothiazide (HYDRODIURIL) tablet 25 mg  25 mg Oral Daily Imagene Sheller S, DO   25 mg at 01/05/22 1002  ? insulin aspart (novoLOG) injection 0-9 Units  0-9 Units Subcutaneous TID WC Rise Patience, MD   2 Units at 01/06/22 [phone]  ? insulin glargine-yfgn (SEMGLEE) injection 22 Units  22 Units Subcutaneous Daily Rise Patience, MD   22 Units at 01/06/22 [id number]  ? labetalol (NORMODYNE) injection 5 mg  5 mg Intravenous Q10 min PRN Rise Patience, MD   5 mg at 01/05/22 0556  ? levothyroxine (SYNTHROID) tablet 75 mcg  75 mcg Oral QAC breakfast Rise Patience, MD   75 mcg at 01/06/22 [id number]  ? lidocaine (LIDODERM) 5 % 1 patch  1 patch Transdermal Q24H Rise Patience, MD   1 patch at 01/04/22 [id number]  ? losartan (COZAAR) tablet 100 mg  100 mg Oral Daily Pokhrel, Laxman, MD   100 mg at 01/05/22 1002  ? melatonin tablet 5 mg  5 mg Oral QHS Pokhrel, Laxman, MD   5 mg at 01/05/22 2159  ? mometasone-formoterol (DULERA) 100-5 MCG/ACT inhaler 2 puff  2 puff Inhalation BID Rise Patience, MD   2 puff at 01/05/22  2103  ? nitroGLYCERIN (NITROSTAT) SL tablet 0.4 mg  0.4 mg Sublingual Q5 min PRN Pokhrel, Laxman, MD      ? pantoprazole (PROTONIX) EC tablet 40 mg  40 mg Oral Daily Rise Patience, MD   40 mg at 01/06/22 0857  ? polyethylene glycol (MIRALAX / GLYCOLAX) packet 17 g  17 g Oral Daily Pokhrel, Laxman, MD   17 g at 01/04/22 0810  ? umeclidinium bromide (INCRUSE ELLIPTA) 62.5 MCG/ACT 1 puff  1 puff Inhalation q morning Pokhrel,  Laxman, MD   1 puff at 01/04/22 0810  ? vitamin B-12 (CYANOCOBALAMIN) tablet 500 mcg  500 mcg Oral Daily Rise Patience, MD   500 mcg at 01/06/22 [id number]  ? ? ? ?Discharge Medications: ?Please see discharge summary for a list of discharge medications. ? ?Relevant Imaging Results: ? ?Relevant Lab Results: ? ? ?Additional Information ?SS#[phone] [id number]; no covid immunizations on file ? ?Tawanna Cooler, RN ? ? ? ? ?

## 2022-01-06 NOTE — Discharge Instructions (Signed)
1) Take cipro as prescribed ?2) Follow up with PCP at [facility] within 48 hours of discharge ?

## 2022-01-06 NOTE — TOC Transition Note (Signed)
Transition of Care (TOC) - CM/SW Discharge Note ? ? ?Patient Details  ?Name: Karla Hoover ?MRN: [id number] ?Date of Birth: Jul 31, 1971 ? ?Transition of Care (TOC) CM/SW Contact:  ?Tawanna Cooler, RN ?Phone Number: ?01/06/2022, 12:51 PM ? ? ?Clinical Narrative:    ? ?Spoke with [place] at [facility] and given the go ahead to set up transport for patient to return to [facility], room 308.   ?Transport called at 12:42 pm.  Nursing aware.  ?Number to call report is [phone].   ? ? ?  ?

## 2022-01-06 NOTE — Discharge Summary (Addendum)
?Physician Discharge Summary ?  ?Patient: Karla Hoover MRN: [id number] DOB: 1971-01-18  ?Admit date:     01/01/2022  ?Discharge date: 01/06/22  ?Discharge Physician: Leslee Home  ? ?PCP: Patient, [name]  ? ?Recommendations at discharge:  ?1) Take cipro as prescribed ?2) Follow up with PCP at [facility] within 48 hours of discharge ? ?Discharge Diagnoses: ?Severe sepsis secondary to acute pyelonephritis ?Lactic acidosis, resolved ?Atypical chest pain, resolved, likely musculoskeletal ?Essential hypertension ?Diabetes mellitus type 2 ?History of PE on Eliquis ?Chronic kidney disease stage III ?Anemia of chronic disease ?Hypothyroidism ?COPD in acute exacerbation ?History of CVA with left-sided hemiplegia ?Constipation ? ?Hospital Course: ?51 year old Caucasian female who is a long-term nursing home resident at [facility].  She has a past medical history of CVA with left-sided hemiplegia, COPD not on chronic oxygen therapy, hypothyroidism, anemia of chronic disease, chronic kidney disease stage III, history of PE on Eliquis, diabetes mellitus type 2, essential hypertension.  This patient was admitted for sepsis secondary to acute pyelonephritis.  She was also found to have a lactic acidosis and given IV fluids.  Was initially hypotensive but responded to fluid resuscitation.  CT scan of the abdomen showed mild right-sided acute pyelonephritis.  Was initially on ceftriaxone.  Blood cultures were negative.  Leukocytosis resolved.  Urine culture growing E. coli with sensitivities and susceptibilities showing multidrug-resistant E. coli.  Infectious disease was consulted given the patient's multiple antibiotic allergies and multidrug-resistant E. coli on urine culture.  The patient has a history of fluoroquinolone allergy was very remote.  Infectious disease trialed her on Cipro which she tolerated for over 24 hours.  We will discharge the patient on Cipro to complete a 10-day course.  She is  feeling better.  Hemodynamics are stable.  No tachycardia.  No fevers.  Blood pressure initially hypotensive but mentioned before but then actually became uncontrolled and accelerated.  She was started on Norvasc and metoprolol but blood pressure became soft this morning therefore will be discontinued on discharge and she can continue her home losartan/HCTZ.  Tolerating diet.  Follow-up with primary care physician at nursing home within 48 hours of discharge. ? ? ?Admitting Diagnosis: ?Principal Problem: ?  Sepsis ([place]) ?Active Problems: ?  History of thromboembolism ?  Type 2 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 disease, with long-term current use of insulin ([place]) ?  Hypothyroidism ?  Essential hypertension ?  COPD with chronic bronchitis ([place]) ?  Grade I diastolic dysfunction ?  Acute pyelonephritis ? ? ? ?Consultants: ID ?Procedures performed: None ?Disposition: Nursing home ?Diet recommendation:  ?Discharge Diet Orders (From admission, onward)  ? ?  Start     Ordered  ? 01/06/22 0000  Diet - low sodium heart healthy       ? 01/06/22 1003  ? 01/06/22 0000  Diet Carb Modified       ? 01/06/22 1003  ? ?  ?  ? ?  ? ?Cardiac and Carb modified diet ?DISCHARGE MEDICATION: ?Allergies as of 01/06/2022   ? ?   Reactions  ? Guaifenesin Anaphylaxis  ? Kiwi Extract Anaphylaxis, Rash  ? Levaquin [levofloxacin In D5w] Shortness Of Breath, Rash  ? Tolerated PO Cipro 01/04/22  ? Strawberry Extract Anaphylaxis, Rash  ? Baclofen Other (See Comments)  ? Near syncope/ fall  ? ?  ? ?  ?Medication List  ?  ? ?STOP taking these medications   ? ?losartan 100 MG tablet ?Commonly known as: COZAAR ?  ?  metoprolol tartrate 25 MG tablet ?Commonly known as: LOPRESSOR ?  ?nitrofurantoin (macrocrystal-monohydrate) 100 MG capsule ?Commonly known as: MACROBID ?  ?omeprazole 20 MG capsule ?Commonly known as: PRILOSEC ?  ? ?  ? ?TAKE these medications   ? ?acetaminophen 500 MG tablet ?Commonly known as: TYLENOL ?Take 1,000 mg by mouth in the  morning, at noon, and at bedtime. ?  ?albuterol 108 (90 Base) MCG/ACT inhaler ?Commonly known as: VENTOLIN HFA ?Inhale 2 puffs into the lungs every 6 (six) hours as needed for wheezing or shortness of breath. ?  ?apixaban 5 MG Tabs tablet ?Commonly known as: Eliquis ?Take 2 tablets (10mg ) twice daily for 7 days, then 1 tablet (5mg ) twice daily ?What changed:  ?how much to take ?how to take this ?when to take this ?additional instructions ?  ?aspirin 81 MG chewable tablet ?Chew 81 mg by mouth in the morning. ?  ?atorvastatin 80 MG tablet ?Commonly known as: LIPITOR ?Take 1 tablet (80 mg total) by mouth daily. ?What changed: when to take this ?  ?Biofreeze 5 % Ptch ?Generic drug: Menthol ?Apply 1 patch topically every morning. For neck pain ?  ?chlorhexidine 0.12 % solution ?Commonly known as: PERIDEX ?Use as directed 15 mLs in the mouth or throat every morning. Rinse and spit after oral care. Do not swallow. ?  ?ciprofloxacin 500 MG tablet ?Commonly known as: CIPRO ?Take 1 tablet (500 mg total) by mouth 2 (two) times daily for 10 days. ?  ?cyclobenzaprine 10 MG tablet ?Commonly known as: FLEXERIL ?Take 1 tablet (10 mg total) by mouth 3 (three) times daily as needed for muscle spasms. ?What changed:  ?when to take this ?additional instructions ?  ?Fish Oil 1000 MG Caps ?Take 1,000 mg by mouth 2 (two) times daily. ?  ?folic acid Q000111Q MCG tablet ?Commonly known as: FOLVITE ?Take 800 mcg by mouth daily. ?  ?gabapentin 300 MG capsule ?Commonly known as: NEURONTIN ?Take 1 capsule (300 mg total) by mouth 3 (three) times daily. ?  ?HYDROCORTISONE (TOPICAL) 1 % Soln ?Apply 1 application. topically 2 (two) times daily as needed (psoriasis rash (scalp, limbs)). ?  ?insulin glargine 100 UNIT/ML injection ?Commonly known as: LANTUS ?Inject 22 Units into the skin every 12 (twelve) hours. ?  ?insulin lispro 100 UNIT/ML injection ?Commonly known as: HUMALOG ?Inject 0-14 Units into the skin See admin instructions. Inject 0-14 units  twice daily (10am and 6pm) per sliding scale: CBG 0-200=0/no insulin; 201-250=2 units; 251-300=4 units; 301-350=6 units; 351-400=8 units: 401-450= 10 units; 451-500=12 units/ if BS reads high, inject 14 units ?  ?ipratropium-albuterol 0.5-2.5 (3) MG/3ML Soln ?Commonly known as: DUONEB ?Inhale 1 vial via nebulizer 3 (three) times daily. ?  ?lansoprazole 30 MG capsule ?Commonly known as: PREVACID ?Take 30 mg by mouth every morning. ?  ?levothyroxine 75 MCG tablet ?Commonly known as: SYNTHROID ?Take 75 mcg by mouth daily at 6 (six) AM. ?  ?lidocaine 5 % ?Commonly known as: Lidoderm ?Place 1 patch onto the skin daily. Remove & Discard patch within 12 hours or as directed by MD ?What changed:  ?when to take this ?additional instructions ?  ?linagliptin 5 MG Tabs tablet ?Commonly known as: TRADJENTA ?Take 5 mg by mouth every morning. ?  ?loperamide 2 MG tablet ?Commonly known as: IMODIUM A-D ?Take 2-4 mg by mouth See admin instructions. Take 2 tablets (4 mg) after 1st loose stool, then take 1 tablet (2mg ) after each additional loose stool. Do not exceed 8mg  in 24 hours. ?  ?losartan-hydrochlorothiazide  100-25 MG tablet ?Commonly known as: HYZAAR ?Take 1 tablet by mouth every morning. ?  ?MAALOX PLUS PO ?Take 30 mLs by mouth every 6 (six) hours as needed (GERD/heartburn/indigestion). Maalox Plus Suspension 225-200-25mg /47mL ?  ?Melatonin 5 MG Subl ?Place 1 tablet under the tongue at bedtime. ?  ?Spiriva Respimat 2.5 MCG/ACT Aers ?Generic drug: Tiotropium Bromide Monohydrate ?Inhale 2.5 mcg into the lungs every morning. ?  ?Symbicort 80-4.5 MCG/ACT inhaler ?Generic drug: budesonide-formoterol ?Inhale 2 puffs into the lungs 2 (two) times daily. ?  ?vitamin B-12 500 MCG tablet ?Commonly known as: CYANOCOBALAMIN ?Take 500 mcg by mouth daily. ?  ? ?  ? ? Follow-up Information   ? ? [facility] PCP Follow up in 2 day(s).   ? ?  ?  ? ?  ?  ? ?  ? ?Discharge Exam: ?Filed Weights  ? 01/01/22 2214  ?Weight: 91.2 kg  ? ?General:  Obese built, not in obvious distress, alert awake and communicative. ?HENT:   No scleral pallor or icterus noted. Oral mucosa is moist.  ?Chest:  Clear breath sounds.  Diminished breath sounds bilaterally.

## 2022-01-06 NOTE — Progress Notes (Signed)
Patient being discharged back to [facility]. Report called to samantha rn. Discharge summary placed in discharge packet for facility.  ?

## 2022-01-07 ENCOUNTER — Telehealth: Payer: Self-pay | Admitting: *Deleted

## 2022-01-07 NOTE — Telephone Encounter (Addendum)
[facility] w/ [facility] has made several attempts to contact patient to scheduled appointment for study, no answer, left message to call. He will cancelled the appointment until he can reach the patient. ? I also tried to contact patient, no answer, left vmessage to contact their office to schedule. ?

## 2022-01-23 ENCOUNTER — Ambulatory Visit (INDEPENDENT_AMBULATORY_CARE_PROVIDER_SITE_OTHER): Payer: Medicaid Other | Admitting: Podiatry

## 2022-01-23 DIAGNOSIS — I999 Unspecified disorder of circulatory system: Secondary | ICD-10-CM

## 2022-01-23 DIAGNOSIS — E119 Type 2 diabetes mellitus without complications: Secondary | ICD-10-CM

## 2022-01-23 NOTE — Progress Notes (Signed)
Subjective: ?Jason Coop presents today referred by Patient, [name] for diabetic foot evaluation. ? ?Patient relates 14 year history of diabetes. ? ?Patient denies any history of foot wounds. ? ?Patient occasional history of numbness, tingling, burning, pins/needles sensations. ? ?Past Medical History:  ?Diagnosis Date  ? Asthma   ? Chronic kidney disease, stage 3a ([place]) 02/09/2021  ? Chronic respiratory failure with hypoxia ([place]) 02/09/2021  ? COPD (chronic obstructive pulmonary disease) ([place])   ? COPD with chronic bronchitis ([place]) 05/04/2009  ? Former smoker 36 pack year smoking history    ? Diabetes mellitus without complication ([place])   ? Essential hypertension 05/04/2009  ? Qualifier: Diagnosis of  By: Quentin Cornwall CMA, Janett Billow    ? GERD without esophagitis 02/09/2021  ? Grade I diastolic dysfunction [id number]  ? Hypertension   ? Hypothyroidism 02/09/2021  ? Mixed 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 ([place]) 02/09/2021  ? Pulmonary embolism ([place]) 11/16/2019  ? Stroke [facility])   ? Type 2 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 disease, with long-term current use of insulin ([place]) 02/09/2021  ? ? ?Patient Active Problem List  ? Diagnosis Date Noted  ? Allergies   ? Sepsis ([place]) 01/02/2022  ? Acute pyelonephritis 01/02/2022  ? Sepsis due to undetermined organism ([place]) 12/02/2021  ? Grade I diastolic dysfunction [id number]  ? Mild protein-calorie malnutrition ([place]) 12/02/2021  ? COPD with acute exacerbation ([place]) 12/02/2021  ? Chest pain 05/29/2021  ? Acute renal failure superimposed on stage 3a chronic kidney disease ([place]) 02/09/2021  ? Chronic respiratory failure with hypoxia ([place]) 02/09/2021  ? History of thromboembolism 02/09/2021  ? Type 2 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 disease, with long-term current use of insulin ([place]) 02/09/2021  ? Mixed 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 ([place]) 02/09/2021  ? GERD without esophagitis 02/09/2021  ?  Hypothyroidism 02/09/2021  ? Pyelonephritis of right kidney 02/09/2021  ? Sepsis secondary to UTI ([place]) 02/08/2021  ? Lymphadenopathy 12/28/2020  ? VTE (venous thromboembolism) 12/01/2019  ? Overweight (BMI 25.0-29.9) 11/22/2019  ? Chronic constipation 11/22/2019  ? Tobacco abuse 11/16/2019  ? Elevated troponin 11/16/2019  ? Bilateral pulmonary embolism ([place]) 11/15/2019  ? DM (diabetes mellitus), type 2 ([place]) 11/15/2019  ? History of stroke 11/15/2019  ? Essential hypertension 05/04/2009  ? COPD with chronic bronchitis ([place]) 05/04/2009  ? ? ?No past surgical history on file. ? ?Current Outpatient Medications on File Prior to Visit  ?Medication Sig Dispense Refill  ? acetaminophen (TYLENOL) 500 MG tablet Take 1,000 mg by mouth in the morning, at noon, and at bedtime.    ? albuterol (VENTOLIN HFA) 108 (90 Base) MCG/ACT inhaler Inhale 2 puffs into the lungs every 6 (six) hours as needed for wheezing or shortness of breath. 8 g 1  ? Alum & Mag Hydroxide-Simeth (MAALOX PLUS PO) Take 30 mLs by mouth every 6 (six) hours as needed (GERD/heartburn/indigestion). Maalox Plus Suspension 225-200-25mg /57mL    ? apixaban (ELIQUIS) 5 MG TABS tablet Take 2 tablets (10mg ) twice daily for 7 days, then 1 tablet (5mg ) twice daily (Patient taking differently: Take 5 mg by mouth 2 (two) times daily.) 60 tablet 0  ? aspirin 81 MG chewable tablet Chew 81 mg by mouth in the morning.    ? atorvastatin (LIPITOR) 80 MG tablet Take 1 tablet (80 mg total) by mouth daily. (Patient taking differently: Take 80 mg by mouth at bedtime.) 30 tablet 5  ? chlorhexidine (PERIDEX) 0.12 % solution  Use as directed 15 mLs in the mouth or throat every morning. Rinse and spit after oral care. Do not swallow.    ? cyclobenzaprine (FLEXERIL) 10 MG tablet Take 1 tablet (10 mg total) by mouth 3 (three) times daily as needed for muscle spasms. (Patient taking differently: Take 10 mg by mouth in the morning, at noon, and at bedtime. Give with 6 oz water) 30 tablet 5  ?  folic acid (FOLVITE) Q000111Q MCG tablet Take 800 mcg by mouth daily. (Patient not taking: Reported on 01/02/2022)    ? gabapentin (NEURONTIN) 300 MG capsule Take 1 capsule (300 mg total) by mouth 3 (three) times daily. 90 capsule 5  ? HYDROCORTISONE, TOPICAL, 1 % SOLN Apply 1 application. topically 2 (two) times daily as needed (psoriasis rash (scalp, limbs)).    ? insulin glargine (LANTUS) 100 UNIT/ML injection Inject 22 Units into the skin every 12 (twelve) hours.    ? insulin lispro (HUMALOG) 100 UNIT/ML injection Inject 0-14 Units into the skin See admin instructions. Inject 0-14 units twice daily (10am and 6pm) per sliding scale: CBG 0-200=0/no insulin; 201-250=2 units; 251-300=4 units; 301-350=6 units; 351-400=8 units: 401-450= 10 units; 451-500=12 units/ if BS reads high, inject 14 units    ? ipratropium-albuterol (DUONEB) 0.5-2.5 (3) MG/3ML SOLN Inhale 1 vial via nebulizer 3 (three) times daily. (Patient not taking: Reported on 01/02/2022) 360 mL 0  ? lansoprazole (PREVACID) 30 MG capsule Take 30 mg by mouth every morning.    ? levothyroxine (SYNTHROID) 75 MCG tablet Take 75 mcg by mouth daily at 6 (six) AM.    ? lidocaine (LIDODERM) 5 % Place 1 patch onto the skin daily. Remove & Discard patch within 12 hours or as directed by MD (Patient taking differently: Place 1 patch onto the skin See admin instructions. Apply to upper shoulder/midback topically one time a day per schedule. Remove & Discard patch within 12 hours or as directed by MD (Apply 0900, take off at 2100)) 30 patch 0  ? linagliptin (TRADJENTA) 5 MG TABS tablet Take 5 mg by mouth every morning.    ? loperamide (IMODIUM A-D) 2 MG tablet Take 2-4 mg by mouth See admin instructions. Take 2 tablets (4 mg) after 1st loose stool, then take 1 tablet (2mg ) after each additional loose stool. Do not exceed 8mg  in 24 hours.    ? losartan-hydrochlorothiazide (HYZAAR) 100-25 MG tablet Take 1 tablet by mouth every morning.    ? Melatonin 5 MG SUBL Place 1 tablet  under the tongue at bedtime.    ? Menthol (BIOFREEZE) 5 % PTCH Apply 1 patch topically every morning. For neck pain    ? Omega-3 Fatty Acids (FISH OIL) 1000 MG CAPS Take 1,000 mg by mouth 2 (two) times daily.    ? SYMBICORT 80-4.5 MCG/ACT inhaler Inhale 2 puffs into the lungs 2 (two) times daily.    ? Tiotropium Bromide Monohydrate (SPIRIVA RESPIMAT) 2.5 MCG/ACT AERS Inhale 2.5 mcg into the lungs every morning.    ? vitamin B-12 (CYANOCOBALAMIN) 500 MCG tablet Take 500 mcg by mouth daily. (Patient not taking: Reported on 01/02/2022)    ? ?No current facility-administered medications on file prior to visit.  ?  ? ?Allergies  ?Allergen Reactions  ? Guaifenesin Anaphylaxis  ? Kiwi Extract Anaphylaxis and Rash  ? Levaquin [Levofloxacin In D5w] Shortness Of Breath and Rash  ?  Tolerated PO Cipro 01/04/22  ? Strawberry Extract Anaphylaxis and Rash  ? Baclofen Other (See Comments)  ?  Near  syncope/ fall  ? ? ?Social History  ? ?Occupational History  ? Not on file  ?Tobacco Use  ? Smoking status: Former  ?  Packs/day: 0.50  ?  Years: 36.00  ?  Pack years: 18.00  ?  Types: Cigarettes  ?  Start date: 10/08/1983  ?  Quit date: 11/22/2019  ?  Years since quitting: 2.1  ? Smokeless tobacco: Never  ?Vaping Use  ? Vaping Use: Never used  ?Substance and Sexual Activity  ? Alcohol use: No  ? Drug use: No  ? Sexual activity: Not on file  ? ? ?Family History  ?Problem Relation Age of Onset  ? Diabetes Mother   ? COPD Sister   ? Heart failure Sister   ? ? ?Immunization History  ?Administered Date(s) Administered  ? Influenza,inj,Quad PF,6+ Mos 07/08/2019  ? PPD Test 07/21/2019, 01/09/2021  ? Tdap 10/04/2019  ? ? ?Review of systems: Positive Findings in bold print. ? ?Constitutional:  chills, fatigue, fever, sweats, weight change ?Communication: Optometrist, sign Ecologist, hand writing, iPad/Android device ?Head: headaches, head injury ?Eyes: changes in vision, eye pain, glaucoma, cataracts, macular degeneration, diplopia, glare,   light sensitivity, eyeglasses or contacts, blindness ?Ears nose mouth throat: hearing impaired, hearing aids,  ringing in ears, deaf, sign language,  vertigo, nosebleeds,  rhinitis,  cold sores, snorin

## 2022-06-22 IMAGING — CT CT ABD-PELV W/ CM
2 of 5 series · 16 of 46 positions shown, 18 images · IV contrast (OMNIPAQUE 300)
Comparison: None.

CLINICAL DATA: Flank pain.

EXAM:
CT ABDOMEN AND PELVIS WITH CONTRAST
TECHNIQUE: Multidetector CT imaging of the abdomen and pelvis was performed
using the standard protocol following bolus administration of
intravenous contrast.

[Series 2: axial st · axial · 0.74mm/px · z∈[-425,-35]mm · 13 of 90 slices shown, 15 images]
[im 6/90  soft-tissue]
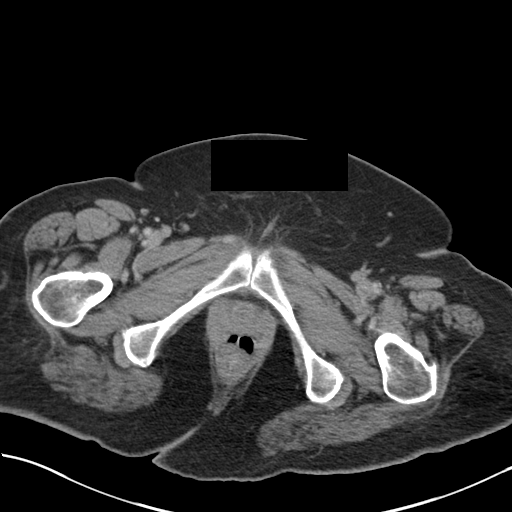
[im 6/90  bone]
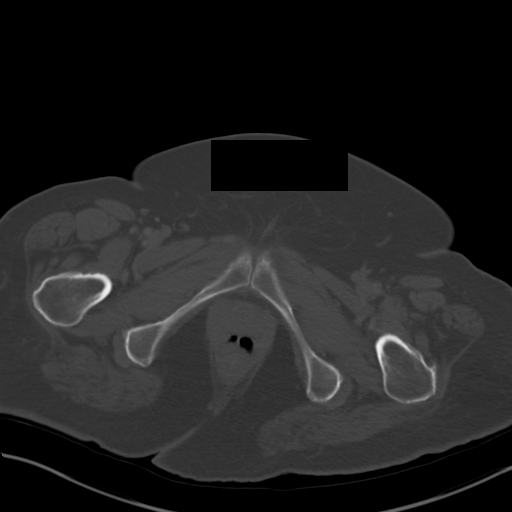
[im 12/90  soft-tissue]
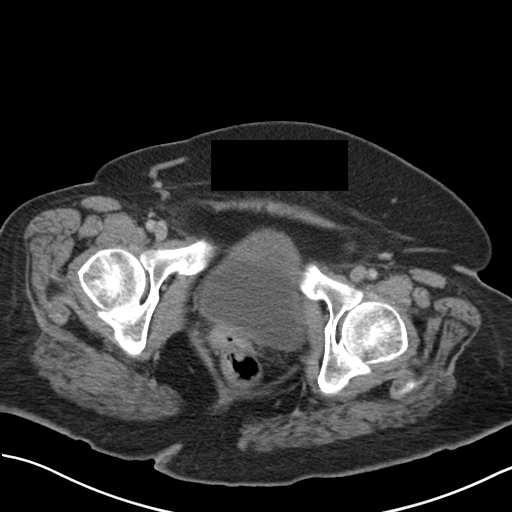
[im 17/90  soft-tissue]
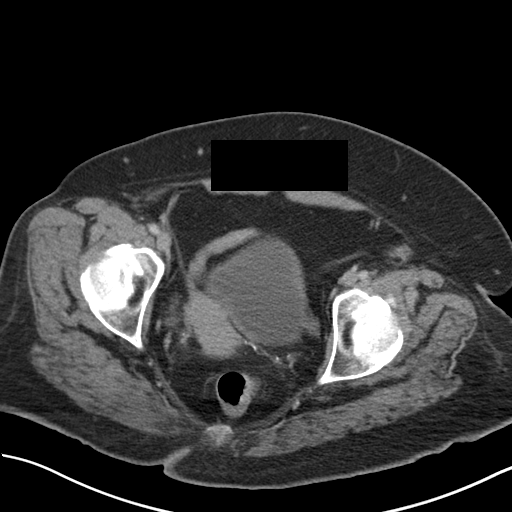
[im 28/90  soft-tissue]
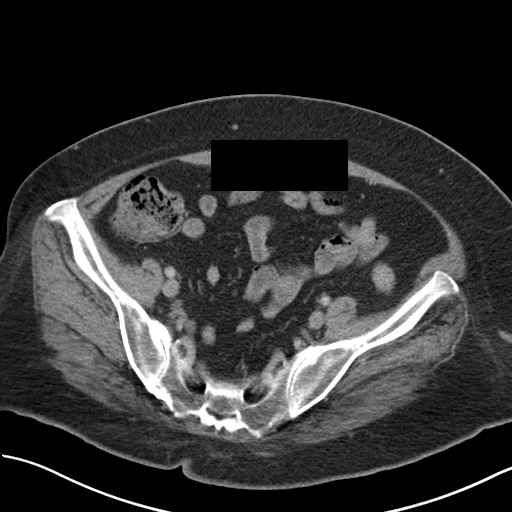
[im 34/90  soft-tissue]
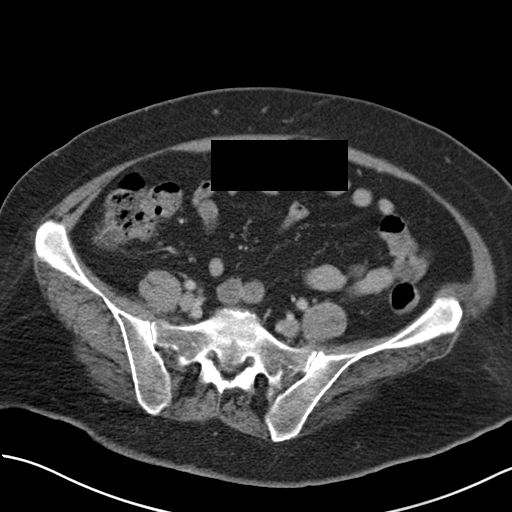
[im 39/90  soft-tissue]
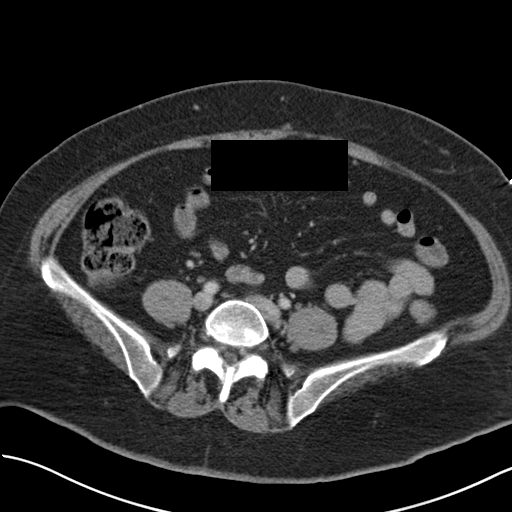
[im 45/90  soft-tissue]
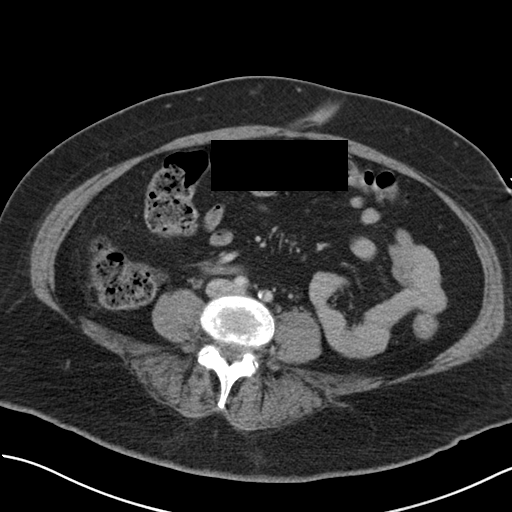
[im 51/90  soft-tissue]
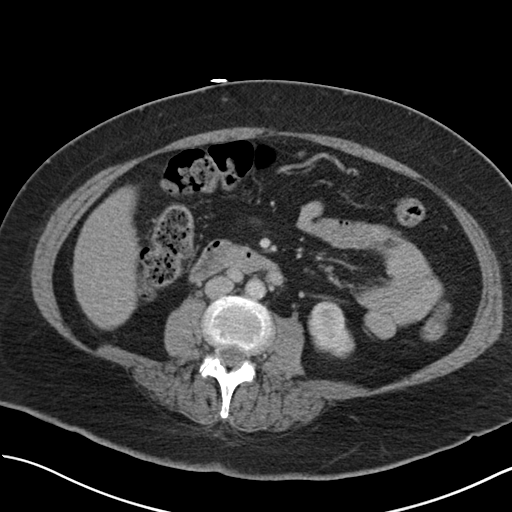
[im 56/90  soft-tissue]
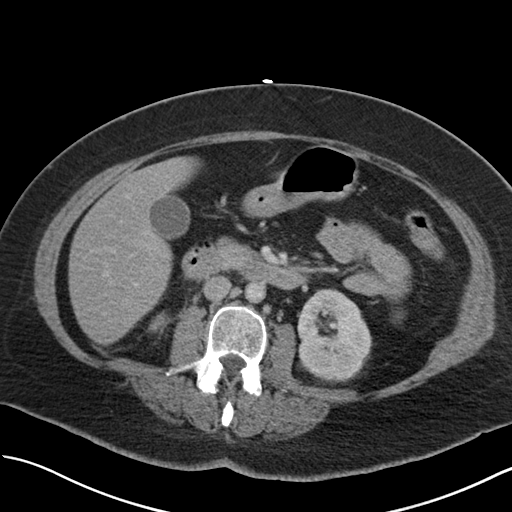
[im 56/90  bone]
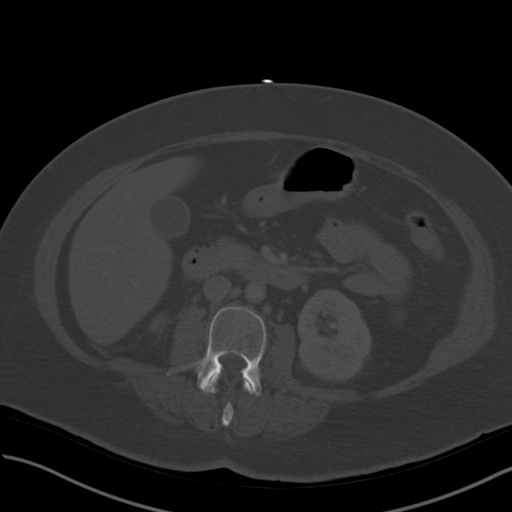
[im 62/90  soft-tissue]
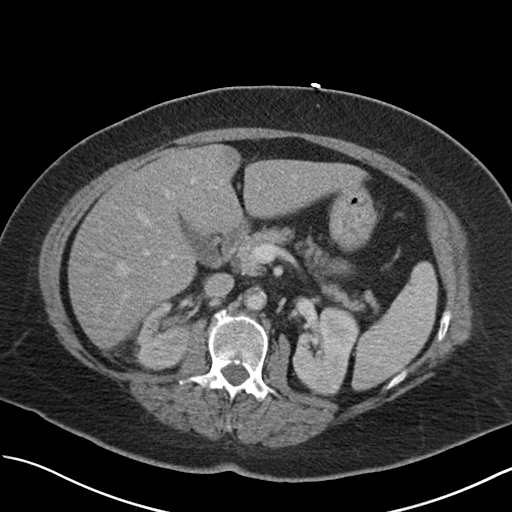
[im 73/90  soft-tissue]
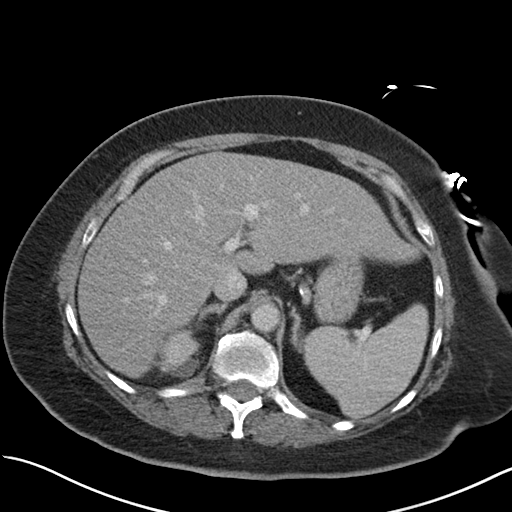
[im 78/90  soft-tissue]
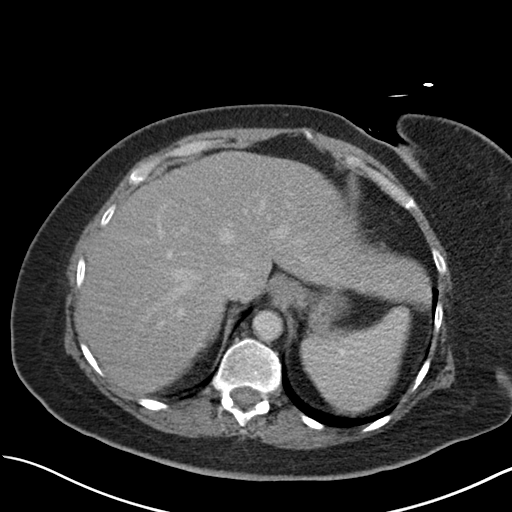
[im 84/90  soft-tissue]
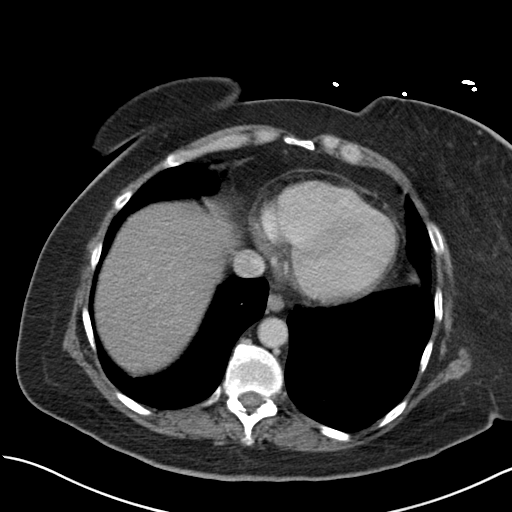

[Series 4: coronal st · coronal · 0.85mm/px · 3 of 146 slices shown]
[im 49/146  soft-tissue]
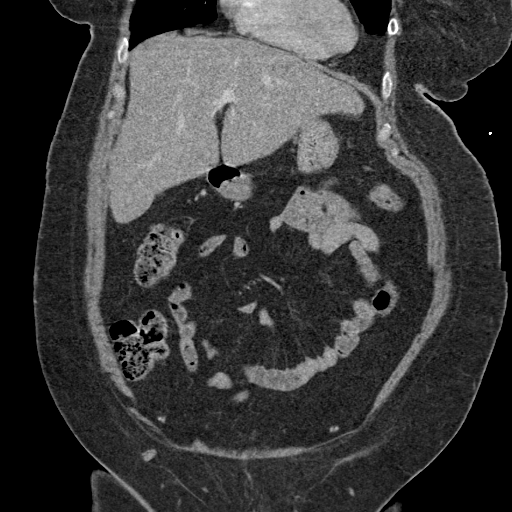
[im 65/146  soft-tissue]
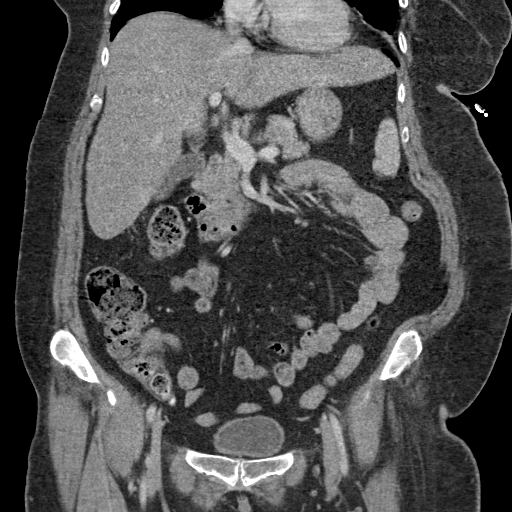
[im 81/146  soft-tissue]
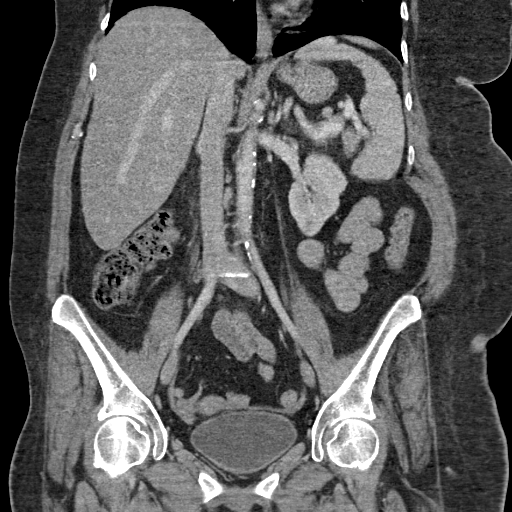

[16 of 46 positions shown; findings below may reference images not displayed]

RADIATION DOSE REDUCTION: This exam was performed according to the
departmental dose-optimization program which includes automated
exposure control, adjustment of the mA and/or kV according to
patient size and/or use of iterative reconstruction technique.

CONTRAST:  100mL OMNIPAQUE IOHEXOL 300 MG/ML  SOLN
FINDINGS: Lower chest: No acute abnormality.

Hepatobiliary: There is diffuse fatty infiltration of the liver
parenchyma. No focal liver abnormality is seen. No gallstones,
gallbladder wall thickening, or biliary dilatation.

Pancreas: Unremarkable. No pancreatic ductal dilatation or
surrounding inflammatory changes.

Spleen: Normal in size without focal abnormality.

Adrenals/Urinary Tract: Adrenal glands are unremarkable. The right
kidney is atrophic in appearance with multiple areas of cortical
scarring noted on the right. A stable 1.7 cm x 0.8 cm cyst is seen
along the anterolateral aspect of the mid to upper right kidney. No
additional follow-up or imaging is recommended. Mild right-sided
perinephric inflammatory fat stranding is seen. This is very mildly
increased in severity when compared to the prior study left kidney
is normal, without renal calculi, focal lesion, or hydronephrosis.
Bladder is unremarkable.

Stomach/Bowel: Stomach is within normal limits. Appendix appears
normal. No evidence of bowel wall thickening, distention, or
inflammatory changes.

Vascular/Lymphatic: Aortic atherosclerosis. Stable para caval
lymphadenopathy is seen at the level of the left kidney.

Reproductive: Uterus and bilateral adnexa are unremarkable.

Other: No abdominal wall hernia or abnormality. No abdominopelvic
ascites.

Musculoskeletal: No acute or significant osseous findings.
IMPRESSION: 1. Mild right-sided acute pyelonephritis. Correlation with
urinalysis is recommended.
2. Hepatic steatosis.
3. Aortic atherosclerosis.

Aortic Atherosclerosis ([id number]).

## 2022-07-26 ENCOUNTER — Other Ambulatory Visit: Payer: Self-pay | Admitting: Family Medicine

## 2022-07-26 DIAGNOSIS — N2889 Other specified disorders of kidney and ureter: Secondary | ICD-10-CM

## 2022-08-07 ENCOUNTER — Ambulatory Visit: Payer: Medicaid Other | Admitting: Neurology

## 2022-08-07 ENCOUNTER — Encounter: Payer: Self-pay | Admitting: Neurology

## 2022-08-07 VITALS — BP 163/92 | HR 81

## 2022-08-07 DIAGNOSIS — I69354 Hemiplegia and hemiparesis following cerebral infarction affecting left non-dominant side: Secondary | ICD-10-CM

## 2022-08-07 DIAGNOSIS — Z8673 Personal history of transient ischemic attack (TIA), and cerebral infarction without residual deficits: Secondary | ICD-10-CM | POA: Diagnosis not present

## 2022-08-07 DIAGNOSIS — I6521 Occlusion and stenosis of right carotid artery: Secondary | ICD-10-CM

## 2022-08-07 DIAGNOSIS — F17210 Nicotine dependence, cigarettes, uncomplicated: Secondary | ICD-10-CM

## 2022-08-07 NOTE — Patient Instructions (Signed)
I had a long d/w patient , mother and caregiver about her remote stroke,spatic hemiplegia, risk for recurrent stroke/TIAs, personally independently reviewed imaging studies and stroke evaluation results and answered questions.Continue aspirin 81 mg daily  for secondary stroke prevention and Eliquis for pulmonary embolism and maintain strict control of hypertension with blood pressure goal below 130/90, diabetes with hemoglobin A1c goal below 6.5% and lipids with LDL cholesterol goal below 70 mg/dL. I also advised the patient to eat a healthy diet with plenty of whole grains, cereals, fruits and vegetables, exercise regularly and maintain ideal body weight .patient was counseled to quit smoking completely.  Check lipid profile and hemoglobin A1c.  Referred to [facility] for consideration for VIVISTIM program to help with left hand mobility and spasticity improvement.  Followup in the future with my nurse practitioner in 3 months or call earlier if necessary.  Stroke Prevention Some medical conditions and behaviors can lead to a higher chance of having a stroke. You can help prevent a stroke by eating healthy, exercising, not smoking, and managing any medical conditions you have. Stroke is a leading cause of functional impairment. Primary prevention is particularly important because a majority of strokes are first-time events. Stroke changes the lives of not only those who experience a stroke but also their family and other caregivers. How can this condition affect me? A stroke is a medical emergency and should be treated right away. A stroke can lead to brain damage and can sometimes be life-threatening. If a person gets medical treatment right away, there is a better chance of surviving and recovering from a stroke. What can increase my risk? The following medical conditions may increase your risk of a stroke: Cardiovascular disease. High blood pressure (hypertension). Diabetes. High  cholesterol. Sickle cell disease. Blood clotting disorders (hypercoagulable state). Obesity. Sleep disorders (obstructive sleep apnea). Other risk factors include: Being older than age 18. Having a history of blood clots, stroke, or mini-stroke (transient ischemic attack, TIA). Genetic factors, such as race, ethnicity, or a family history of stroke. Smoking cigarettes or using other tobacco products. Taking birth control pills, especially if you also use tobacco. Heavy use of alcohol or drugs, especially cocaine and methamphetamine. Physical inactivity. What actions can I take to prevent this? Manage your health conditions High cholesterol levels. Eating a healthy diet is important for preventing high cholesterol. If cholesterol cannot be managed through diet alone, you may need to take medicines. Take any prescribed medicines to control your cholesterol as told by your health care provider. Hypertension. To reduce your risk of stroke, try to keep your blood pressure below 130/80. Eating a healthy diet and exercising regularly are important for controlling blood pressure. If these steps are not enough to manage your blood pressure, you may need to take medicines. Take any prescribed medicines to control hypertension as told by your health care provider. Ask your health care provider if you should monitor your blood pressure at home. Have your blood pressure checked every year, even if your blood pressure is normal. Blood pressure increases with age and some medical conditions. Diabetes. Eating a healthy diet and exercising regularly are important parts of managing your blood sugar (glucose). If your blood sugar cannot be managed through diet and exercise, you may need to take medicines. Take any prescribed medicines to control your diabetes as told by your health care provider. Get evaluated for obstructive sleep apnea. Talk to your health care provider about getting a sleep evaluation if  you  snore a lot or have excessive sleepiness. Make sure that any other medical conditions you have, such as atrial fibrillation or atherosclerosis, are managed. Nutrition Follow instructions from your health care provider about what to eat or drink to help manage your health condition. These instructions may include: Reducing your daily calorie intake. Limiting how much salt (sodium) you use to 1,500 milligrams (mg) each day. Using only healthy fats for cooking, such as olive oil, canola oil, or sunflower oil. Eating healthy foods. You can do this by: Choosing foods that are high in fiber, such as whole grains, and fresh fruits and vegetables. Eating at least 5 servings of fruits and vegetables a day. Try to fill one-half of your plate with fruits and vegetables at each meal. Choosing lean protein foods, such as lean cuts of meat, poultry without skin, fish, tofu, beans, and nuts. Eating low-fat dairy products. Avoiding foods that are high in sodium. This can help lower blood pressure. Avoiding foods that have saturated fat, trans fat, and cholesterol. This can help prevent high cholesterol. Avoiding processed and prepared foods. Counting your daily carbohydrate intake.  Lifestyle If you drink alcohol: Limit how much you have to: 0-1 drink a day for women who are not pregnant. 0-2 drinks a day for men. Know how much alcohol is in your drink. In the [place] one drink equals one 12 oz bottle of beer (340m), one 5 oz glass of wine (1487m, or one 1 oz glass of hard liquor (4467m Do not use any products that contain nicotine or tobacco. These products include cigarettes, chewing tobacco, and vaping devices, such as e-cigarettes. If you need help quitting, ask your health care provider. Avoid secondhand smoke. Do not use drugs. Activity  Try to stay at a healthy weight. Get at least 30 minutes of exercise on most days, such as: Fast walking. Biking. Swimming. Medicines Take  over-the-counter and prescription medicines only as told by your health care provider. Aspirin or blood thinners (antiplatelets or anticoagulants) may be recommended to reduce your risk of forming blood clots that can lead to stroke. Avoid taking birth control pills. Talk to your health care provider about the risks of taking birth control pills if: You are over 35 83ars old. You smoke. You get very bad headaches. You have had a blood clot. Where to find more information American Stroke Association: www.strokeassociation.org Get help right away if: You or a loved one has any symptoms of a stroke. "BE FAST" is an easy way to remember the main warning signs of a stroke: B - Balance. Signs are dizziness, sudden trouble walking, or loss of balance. E - Eyes. Signs are trouble seeing or a sudden change in vision. F - Face. Signs are sudden weakness or numbness of the face, or the face or eyelid drooping on one side. A - Arms. Signs are weakness or numbness in an arm. This happens suddenly and usually on one side of the body. S - Speech. Signs are sudden trouble speaking, slurred speech, or trouble understanding what people say. T - Time. Time to call emergency services. Write down what time symptoms started. You or a loved one has other signs of a stroke, such as: A sudden, severe headache with no known cause. Nausea or vomiting. Seizure. These symptoms may represent a serious problem that is an emergency. Do not wait to see if the symptoms will go away. Get medical help right away. Call your local emergency services (911 in the [place] Do not drive  yourself to the hospital. Summary You can help to prevent a stroke by eating healthy, exercising, not smoking, limiting alcohol intake, and managing any medical conditions you may have. Do not use any products that contain nicotine or tobacco. These include cigarettes, chewing tobacco, and vaping devices, such as e-cigarettes. If you need help quitting,  ask your health care provider. Remember "BE FAST" for warning signs of a stroke. Get help right away if you or a loved one has any of these signs. This information is not intended to replace advice given to you by your health care provider. Make sure you discuss any questions you have with your health care provider. Document Revised: 04/24/2020 Document Reviewed: 04/24/2020 Elsevier Patient Education  2023 [employer].

## 2022-08-07 NOTE — Progress Notes (Signed)
[facility] [address] [place]. [place] [address] [phone]       OFFICE CONSULT NOTE  Ms. MAYLEIGH TETRAULT Date of Birth:  1971-07-01 Medical Record Number:  [id number]   Referring MD: Marzetta Board  Reason for Referral: Establish neurological follow-up  HPI: Karla Hoover is a 51 year old Caucasian lady seen today for initial office consultation visit.  She is accompanied by her mother and caregiver from skilled nursing facility where she is staying.  History is obtained from them and review of electronic medical records in care everywhere.  No actual brain imaging films available for review today.  She has a past medical history of diabetes, hypertension, hyperlipidemia, obesity, hypothyroidism, COPD chronic kidney disease.  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  He was found to have complete right ICA occlusion.  He was airlifted and transferred to [facility] in [place] for emergent thrombectomy and subsequently went to inpatient rehab for prolonged..  Had persistent left hemiparesis and spasticity changes over time.  He spent a few more months and skilled nursing facility and subsequently went home  to the [place] with her family in November 2020 09 October 2019 when she developed sudden onset of shortness of breath and chest pain and was diagnosed with pulmonary embolism started on Eliquis which she has continued on.  She has not had any recurrent further stroke or TIA symptoms.  She is able to ambulate with a hemiwalker but has persistent spastic left hemiplegia with very little movement in the left hand and fingers and left foot drop.  She has had no recent falls or injuries.  Patient continues to smoke despite being counseled to quit smoking.  He is tolerating Eliquis well without bruising or bleeding.  He is also on Lipitor 80 mg tolerating well without any aches or pains.  I do not have  any recent lab to look at.  She has no new complaints.  Imaging results available from MRI scan of the brain at [facility] in [place] on 06/07/2020 showed old right basal ganglia and right corona radiator infarct with ex vacuo dilatation of the right lateral ventricle and generalized atrophy.  CT angiogram of the brain and neck on 06/06/2020 showed right ICA occlusion in the neck with mild left ICA stenosis.  Severe left vertebral artery origin stenosis with diminutive caliber of the left vertebral artery.  50% stenosis of the left subclavian artery.    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   Former smoker 36 pack year smoking history     Diabetes mellitus without complication (HCC)    Essential hypertension 05/04/2009   Qualifier: Diagnosis of  By: Roxan Hockey CMA, Jessica     GERD without esophagitis 02/09/2021   Grade I diastolic dysfunction 12/02/2021   Hypertension    Hypothyroidism 02/09/2021   Mixed 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 (HCC) 02/09/2021   Pulmonary embolism (HCC) 11/16/2019   Stroke (HCC)    Type 2 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 disease, with long-term current use of insulin (HCC) 02/09/2021  Social History:  Social History   Socioeconomic History   Marital status: Single    Spouse name: Not on file   Number of children: Not on file   Years of education: Not on file   Highest education level: Not on file  Occupational History   Not on file  Tobacco Use   Smoking status: Former    Packs/day: 0.50    Years: 36.00    Total pack years: 18.00    Types: Cigarettes    Start date: 10/08/1983     Quit date: 11/22/2019    Years since quitting: 2.7   Smokeless tobacco: Never  Vaping Use   Vaping Use: Never used  Substance and Sexual Activity   Alcohol use: No   Drug use: No   Sexual activity: Not on file  Other Topics Concern   Not on file  Social History Narrative   Not on file   Social Determinants of Health   Financial Resource Strain: Not on file  Food Insecurity: Not on file  Transportation Needs: Not on file  Physical Activity: Not on file  Stress: Not on file  Social Connections: Not on file  Intimate Partner Violence: Not on file    Medications:   Current Outpatient Medications on File Prior to Visit  Medication Sig Dispense Refill   acetaminophen (TYLENOL) 500 MG tablet Take 1,000 mg by mouth in the morning, at noon, and at bedtime.     albuterol (VENTOLIN HFA) 108 (90 Base) MCG/ACT inhaler Inhale 2 puffs into the lungs every 6 (six) hours as needed for wheezing or shortness of breath. 8 g 1   Alum & Mag Hydroxide-Simeth (MAALOX PLUS PO) Take 30 mLs by mouth every 6 (six) hours as needed (GERD/heartburn/indigestion). Maalox Plus Suspension 225-200-25mg /39mL     apixaban (ELIQUIS) 5 MG TABS tablet Take 2 tablets (10mg ) twice daily for 7 days, then 1 tablet (5mg ) twice daily (Patient taking differently: Take 5 mg by mouth 2 (two) times daily.) 60 tablet 0   aspirin 81 MG chewable tablet Chew 81 mg by mouth in the morning.     atorvastatin (LIPITOR) 80 MG tablet Take 1 tablet (80 mg total) by mouth daily. (Patient taking differently: Take 80 mg by mouth at bedtime.) 30 tablet 5   chlorhexidine (PERIDEX) 0.12 % solution Use as directed 15 mLs in the mouth or throat every morning. Rinse and spit after oral care. Do not swallow.     cyclobenzaprine (FLEXERIL) 10 MG tablet Take 1 tablet (10 mg total) by mouth 3 (three) times daily as needed for muscle spasms. (Patient taking differently: Take 10 mg by mouth in the morning, at noon, and at bedtime. Give with 6 oz water) 30  tablet 5   Dulaglutide (TRULICITY [place]) Inject into the skin.     ezetimibe (ZETIA) 10 MG tablet Take 10 mg by mouth daily.     folic acid (FOLVITE) 800 MCG tablet Take 800 mcg by mouth daily.     HYDROCORTISONE, TOPICAL, 1 % SOLN Apply 1 application. topically 2 (two) times daily as needed (psoriasis rash (scalp, limbs)).     insulin glargine (LANTUS) 100 UNIT/ML injection Inject 22 Units into the skin every 12 (twelve) hours.     insulin lispro (HUMALOG) 100 UNIT/ML injection Inject 0-14 Units into the skin See admin instructions. Inject 0-14 units twice daily (10am and 6pm) per sliding scale: CBG 0-200=0/no insulin; 201-250=2 units; 251-300=4 units; 301-350=6 units; 351-400=8 units: 401-450= 10 units; 451-500=12 units/ if  BS reads high, inject 14 units     ipratropium-albuterol (DUONEB) 0.5-2.5 (3) MG/3ML SOLN Inhale 1 vial via nebulizer 3 (three) times daily. 360 mL 0   lansoprazole (PREVACID) 30 MG capsule Take 30 mg by mouth every morning.     levothyroxine (SYNTHROID) 75 MCG tablet Take 75 mcg by mouth daily at 6 (six) AM.     linagliptin (TRADJENTA) 5 MG TABS tablet Take 5 mg by mouth every morning.     loperamide (IMODIUM A-D) 2 MG tablet Take 2-4 mg by mouth See admin instructions. Take 2 tablets (4 mg) after 1st loose stool, then take 1 tablet (2mg ) after each additional loose stool. Do not exceed 8mg  in 24 hours.     losartan-hydrochlorothiazide (HYZAAR) 100-25 MG tablet Take 1 tablet by mouth every morning.     Melatonin 5 MG SUBL Place 1 tablet under the tongue at bedtime.     Menthol (BIOFREEZE) 5 % PTCH Apply 1 patch topically every morning. For neck pain     metoprolol tartrate (LOPRESSOR) 50 MG tablet Take 50 mg by mouth 2 (two) times daily.     Omega-3 Fatty Acids (FISH OIL) 1000 MG CAPS Take 1,000 mg by mouth 2 (two) times daily.     Skin Protectants, Misc. (EUCERIN) cream Apply topically as needed for dry skin.     SYMBICORT 80-4.5 MCG/ACT inhaler Inhale 2 puffs into the lungs 2  (two) times daily.     Tiotropium Bromide Monohydrate (SPIRIVA RESPIMAT) 2.5 MCG/ACT AERS Inhale 2.5 mcg into the lungs every morning.     vitamin B-12 (CYANOCOBALAMIN) 500 MCG tablet Take 500 mcg by mouth daily.     VITAMIN D PO Take by mouth.     gabapentin (NEURONTIN) 300 MG capsule Take 1 capsule (300 mg total) by mouth 3 (three) times daily. (Patient taking differently: Take 400 mg by mouth 3 (three) times daily.) 90 capsule 5   No current facility-administered medications on file prior to visit.    Allergies:   Allergies  Allergen Reactions   Guaifenesin Anaphylaxis   Kiwi Extract Anaphylaxis and Rash   Levaquin [Levofloxacin In D5w] Shortness Of Breath and Rash    Tolerated PO Cipro 01/04/22   Strawberry Extract Anaphylaxis and Rash   Baclofen Other (See Comments)    Near syncope/ fall    Physical Exam General: Obese middle-aged Caucasian lady, seated, in no evident distress Head: head normocephalic and atraumatic.   Neck: supple with no carotid or supraclavicular bruits Cardiovascular: regular rate and rhythm, no murmurs Musculoskeletal: no deformity Skin:  no rash/petichiae Vascular:  Normal pulses all extremities  Neurologic Exam Mental Status: Awake and fully alert. Oriented to place and time. Recent and remote memory intact. Attention span, concentration and fund of knowledge appropriate. Mood and affect appropriate.  Cranial Nerves: Fundoscopic exam reveals sharp disc margins. Pupils equal, briskly reactive to light. Extraocular movements full without nystagmus. Visual fields full to confrontation. Hearing intact. Facial sensation intact.  Moderate left lower face weakness.  Tongue, palate moves normally and symmetrically.  Motor: Normal strength on the right.  Spastic left hemiparesis with 3/5 left upper extremity strength proximally and 1/5 in the hands with spasticity and tone.  4/5 left lower extremity proximally at the hip and knee left foot drop with ankle  dorsiflexor weakness sensory.: intact to touch , pinprick , position and vibratory sensation.  Coordination: Rapid alternating movements normal in all extremities. Finger-to-nose and heel-to-shin performed accurately bilaterally. Gait and Station: Arises from chair  without difficulty.  Spastic hemiplegic gait with left foot drop and circumduction.   Reflexes: 2+ and asymmetric and brisker on the left. Toes downgoing.   NIHSS  5 Modified Rankin  3   ASSESSMENT: 51 year old Caucasian lady with history of large right MCA infarct in August 2020 due to right ICA occlusion with residual significant spastic left hemiparesis  .  Vascular risk factors of diabetes, hypertension, hyperlipidemia, obesity, smoking and significant Intracranial and extracranial atherosclerosis     PLAN:I had a long d/w patient , mother and caregiver about her remote stroke,spatic hemiplegia, risk for recurrent stroke/TIAs, personally independently reviewed imaging studies and stroke evaluation results and answered questions.Continue aspirin 81 mg daily  for secondary stroke prevention and Eliquis for pulmonary embolism and maintain strict control of hypertension with blood pressure goal below 130/90, diabetes with hemoglobin A1c goal below 6.5% and lipids with LDL cholesterol goal below 70 mg/dL. I also advised the patient to eat a healthy diet with plenty of whole grains, cereals, fruits and vegetables, exercise regularly and maintain ideal body weight .patient was counseled to quit smoking completely.  Check lipid profile and hemoglobin A1c.  Referred to [facility] for consideration for VIVISTIM program to help with left hand mobility and spasticity improvement.  Followup in the future with my nurse practitioner in 3 months or call earlier if necessary.  Greater than 50% time during this 45-minute consultation visit was spent in counseling and coordination of care about her remote stroke and spastic left  hemiparesis and discussion about stroke prevention and treatment and answering questions. Antony Contras, MD  Note: This document was prepared with digital dictation and possible smart phrase technology. Any transcriptional errors that result from this process are unintentional.

## 2022-08-29 ENCOUNTER — Emergency Department (HOSPITAL_COMMUNITY): Payer: Medicaid Other

## 2022-08-29 ENCOUNTER — Encounter (HOSPITAL_COMMUNITY): Payer: Self-pay | Admitting: Emergency Medicine

## 2022-08-29 ENCOUNTER — Observation Stay (HOSPITAL_COMMUNITY)
Admission: EM | Admit: 2022-08-29 | Discharge: 2022-08-30 | Disposition: A | Discharge status: Skilled Nursing Facility | Payer: Medicaid Other | Attending: Internal Medicine | Admitting: Internal Medicine

## 2022-08-29 ENCOUNTER — Other Ambulatory Visit: Payer: Self-pay

## 2022-08-29 DIAGNOSIS — Z79899 Other long term (current) drug therapy: Secondary | ICD-10-CM | POA: Diagnosis not present

## 2022-08-29 DIAGNOSIS — N1831 Chronic kidney disease, stage 3a: Secondary | ICD-10-CM | POA: Diagnosis not present

## 2022-08-29 DIAGNOSIS — Z8673 Personal history of transient ischemic attack (TIA), and cerebral infarction without residual deficits: Secondary | ICD-10-CM | POA: Insufficient documentation

## 2022-08-29 DIAGNOSIS — I69354 Hemiplegia and hemiparesis following cerebral infarction affecting left non-dominant side: Secondary | ICD-10-CM

## 2022-08-29 DIAGNOSIS — J449 Chronic obstructive pulmonary disease, unspecified: Secondary | ICD-10-CM | POA: Insufficient documentation

## 2022-08-29 DIAGNOSIS — Z86711 Personal history of pulmonary embolism: Secondary | ICD-10-CM | POA: Diagnosis not present

## 2022-08-29 DIAGNOSIS — E1122 Type 2 diabetes mellitus with diabetic chronic kidney disease: Secondary | ICD-10-CM | POA: Diagnosis not present

## 2022-08-29 DIAGNOSIS — J4489 Other specified chronic obstructive pulmonary disease: Secondary | ICD-10-CM | POA: Diagnosis present

## 2022-08-29 DIAGNOSIS — N179 Acute kidney failure, unspecified: Secondary | ICD-10-CM | POA: Diagnosis not present

## 2022-08-29 DIAGNOSIS — I10 Essential (primary) hypertension: Secondary | ICD-10-CM | POA: Diagnosis present

## 2022-08-29 DIAGNOSIS — Z87891 Personal history of nicotine dependence: Secondary | ICD-10-CM | POA: Diagnosis not present

## 2022-08-29 DIAGNOSIS — J45909 Unspecified asthma, uncomplicated: Secondary | ICD-10-CM | POA: Insufficient documentation

## 2022-08-29 DIAGNOSIS — Z794 Long term (current) use of insulin: Secondary | ICD-10-CM | POA: Insufficient documentation

## 2022-08-29 DIAGNOSIS — E039 Hypothyroidism, unspecified: Secondary | ICD-10-CM | POA: Diagnosis present

## 2022-08-29 DIAGNOSIS — Z7982 Long term (current) use of aspirin: Secondary | ICD-10-CM | POA: Diagnosis not present

## 2022-08-29 DIAGNOSIS — Z7985 Long-term (current) use of injectable non-insulin antidiabetic drugs: Secondary | ICD-10-CM | POA: Insufficient documentation

## 2022-08-29 DIAGNOSIS — Z7901 Long term (current) use of anticoagulants: Secondary | ICD-10-CM | POA: Insufficient documentation

## 2022-08-29 DIAGNOSIS — N390 Urinary tract infection, site not specified: Secondary | ICD-10-CM

## 2022-08-29 DIAGNOSIS — J9611 Chronic respiratory failure with hypoxia: Secondary | ICD-10-CM | POA: Diagnosis present

## 2022-08-29 DIAGNOSIS — G501 Atypical facial pain: Secondary | ICD-10-CM | POA: Diagnosis present

## 2022-08-29 DIAGNOSIS — E663 Overweight: Secondary | ICD-10-CM | POA: Diagnosis present

## 2022-08-29 LAB — CK: Total CK: 27 U/L — ABNORMAL LOW (ref 38–234)

## 2022-08-29 LAB — BLOOD GAS, VENOUS
Acid-Base Excess: 6.5 mmol/L — ABNORMAL HIGH (ref 0.0–2.0)
Bicarbonate: 33.9 mmol/L — ABNORMAL HIGH (ref 20.0–28.0)
O2 Saturation: 64.9 %
Patient temperature: 37
pCO2, Ven: 60 mmHg (ref 44–60)
pH, Ven: 7.36 (ref 7.25–7.43)
pO2, Ven: 39 mmHg (ref 32–45)

## 2022-08-29 LAB — CBC WITH DIFFERENTIAL/PLATELET
Abs Immature Granulocytes: 0.1 10*3/uL — ABNORMAL HIGH (ref 0.00–0.07)
Basophils Absolute: 0 10*3/uL (ref 0.0–0.1)
Basophils Relative: 0 %
Eosinophils Absolute: 0.1 10*3/uL (ref 0.0–0.5)
Eosinophils Relative: 1 %
HCT: 41.8 % (ref 36.0–46.0)
Hemoglobin: 13 g/dL (ref 12.0–15.0)
Immature Granulocytes: 1 %
Lymphocytes Relative: 39 %
Lymphs Abs: 4.8 10*3/uL — ABNORMAL HIGH (ref 0.7–4.0)
MCH: 27.2 pg (ref 26.0–34.0)
MCHC: 31.1 g/dL (ref 30.0–36.0)
MCV: 87.4 fL (ref 80.0–100.0)
Monocytes Absolute: 0.7 10*3/uL (ref 0.1–1.0)
Monocytes Relative: 5 %
Neutro Abs: 6.8 10*3/uL (ref 1.7–7.7)
Neutrophils Relative %: 54 %
Platelets: 255 10*3/uL (ref 150–400)
RBC: 4.78 MIL/uL (ref 3.87–5.11)
RDW: 13 % (ref 11.5–15.5)
WBC: 12.5 10*3/uL — ABNORMAL HIGH (ref 4.0–10.5)
nRBC: 0 % (ref 0.0–0.2)

## 2022-08-29 LAB — TROPONIN I (HIGH SENSITIVITY)
Troponin I (High Sensitivity): 4 ng/L (ref <18)
Troponin I (High Sensitivity): 3 ng/L (ref <18)

## 2022-08-29 LAB — COMPREHENSIVE METABOLIC PANEL
ALT: 16 U/L (ref 0–44)
AST: 24 U/L (ref 15–41)
Albumin: 3.3 g/dL — ABNORMAL LOW (ref 3.5–5.0)
Alkaline Phosphatase: 57 U/L (ref 38–126)
Anion gap: 9 (ref 5–15)
BUN: 34 mg/dL — ABNORMAL HIGH (ref 6–20)
CO2: 27 mmol/L (ref 22–32)
Calcium: 8.8 mg/dL — ABNORMAL LOW (ref 8.9–10.3)
Chloride: 102 mmol/L (ref 98–111)
Creatinine, Ser: 1.75 mg/dL — ABNORMAL HIGH (ref 0.44–1.00)
GFR, Estimated: 35 mL/min — ABNORMAL LOW (ref >60)
Glucose, Bld: 266 mg/dL — ABNORMAL HIGH (ref 70–99)
Potassium: 3.9 mmol/L (ref 3.5–5.1)
Sodium: 138 mmol/L (ref 135–145)
Total Bilirubin: 0.5 mg/dL (ref 0.3–1.2)
Total Protein: 7.3 g/dL (ref 6.5–8.1)

## 2022-08-29 LAB — I-STAT BETA HCG BLOOD, ED (MC, WL, AP ONLY): I-stat hCG, quantitative: <5 m[IU]/mL (ref <5)

## 2022-08-29 LAB — CBG MONITORING, ED: Glucose-Capillary: 263 mg/dL — ABNORMAL HIGH (ref 70–99)

## 2022-08-29 LAB — LACTIC ACID, PLASMA
Lactic Acid, Venous: 1.4 mmol/L (ref 0.5–1.9)
Lactic Acid, Venous: 1.1 mmol/L (ref 0.5–1.9)

## 2022-08-29 LAB — BETA-HYDROXYBUTYRIC ACID: Beta-Hydroxybutyric Acid: 0.18 mmol/L (ref 0.05–0.27)

## 2022-08-29 LAB — LIPASE, BLOOD: Lipase: 42 U/L (ref 11–51)

## 2022-08-29 NOTE — ED Provider Notes (Signed)
[facility] DEPT Provider Note   CSN: [id number] Arrival date & time: 08/29/22  1817     History  No chief complaint on file.   Karla Hoover is a 51 y.o. female.  The history is provided by the patient, medical records and the EMS personnel. No language interpreter was used.  Illness Location:  Fatigue Severity:  Severe Onset quality:  Gradual Duration:  3 days Timing:  Constant Progression:  Worsening Chronicity:  Recurrent Associated symptoms: congestion, cough, fatigue, headaches (face pain), rhinorrhea and shortness of breath   Associated symptoms: no abdominal pain (left flank pain present), no chest pain, no diarrhea, no fever (chills present), no loss of consciousness, no nausea, no rash, no vomiting and no wheezing        Home Medications Prior to Admission medications   Medication Sig Start Date End Date Taking? Authorizing Provider  acetaminophen (TYLENOL) 500 MG tablet Take 1,000 mg by mouth in the morning, at noon, and at bedtime.    [provider]  albuterol (VENTOLIN HFA) 108 (90 Base) MCG/ACT inhaler Inhale 2 puffs into the lungs every 6 (six) hours as needed for wheezing or shortness of breath. 10/04/19   Kerin Perna, NP  Alum & Mag Hydroxide-Simeth (MAALOX PLUS PO) Take 30 mLs by mouth every 6 (six) hours as needed (GERD/heartburn/indigestion). Maalox Plus Suspension 225-200-25mg /58mL    [provider]  apixaban (ELIQUIS) 5 MG TABS tablet Take 2 tablets (10mg ) twice daily for 7 days, then 1 tablet (5mg ) twice daily Patient taking differently: Take 5 mg by mouth 2 (two) times daily. 11/21/19   Kayleen Memos, DO  aspirin 81 MG chewable tablet Chew 81 mg by mouth in the morning.    [provider]  atorvastatin (LIPITOR) 80 MG tablet Take 1 tablet (80 mg total) by mouth daily. Patient taking differently: Take 80 mg by mouth at bedtime. 10/04/19   Kerin Perna, NP  chlorhexidine  (PERIDEX) 0.12 % solution Use as directed 15 mLs in the mouth or throat every morning. Rinse and spit after oral care. Do not swallow.    [provider]  cyclobenzaprine (FLEXERIL) 10 MG tablet Take 1 tablet (10 mg total) by mouth 3 (three) times daily as needed for muscle spasms. Patient taking differently: Take 10 mg by mouth in the morning, at noon, and at bedtime. Give with 6 oz water 10/04/19   Kerin Perna, NP  Dulaglutide (TRULICITY [place]) Inject into the skin.    [provider]  ezetimibe (ZETIA) 10 MG tablet Take 10 mg by mouth daily.    [provider]  folic acid (FOLVITE) Q000111Q MCG tablet Take 800 mcg by mouth daily.    [provider]  gabapentin (NEURONTIN) 300 MG capsule Take 1 capsule (300 mg total) by mouth 3 (three) times daily. Patient taking differently: Take 400 mg by mouth 3 (three) times daily. 11/11/19 01/02/22  Kerin Perna, NP  HYDROCORTISONE, TOPICAL, 1 % SOLN Apply 1 application. topically 2 (two) times daily as needed (psoriasis rash (scalp, limbs)).    [provider]  insulin glargine (LANTUS) 100 UNIT/ML injection Inject 22 Units into the skin every 12 (twelve) hours.    [provider]  insulin lispro (HUMALOG) 100 UNIT/ML injection Inject 0-14 Units into the skin See admin instructions. Inject 0-14 units twice daily (10am and 6pm) per sliding scale: CBG 0-200=0/no insulin; 201-250=2 units; 251-300=4 units; 301-350=6 units; 351-400=8 units: 401-450= 10 units; 451-500=12 units/  if BS reads high, inject 14 units    [provider]  ipratropium-albuterol (DUONEB) 0.5-2.5 (3) MG/3ML SOLN Inhale 1 vial via nebulizer 3 (three) times daily. 12/03/21   Swayze, Ava, DO  lansoprazole (PREVACID) 30 MG capsule Take 30 mg by mouth every morning.    [provider]  levothyroxine (SYNTHROID) 75 MCG tablet Take 75 mcg by mouth daily at 6 (six) AM.    [provider]  linagliptin (TRADJENTA) 5 MG  TABS tablet Take 5 mg by mouth every morning.    [provider]  loperamide (IMODIUM A-D) 2 MG tablet Take 2-4 mg by mouth See admin instructions. Take 2 tablets (4 mg) after 1st loose stool, then take 1 tablet (2mg ) after each additional loose stool. Do not exceed 8mg  in 24 hours.    [provider]  losartan-hydrochlorothiazide (HYZAAR) 100-25 MG tablet Take 1 tablet by mouth every morning.    [provider]  Melatonin 5 MG SUBL Place 1 tablet under the tongue at bedtime.    [provider]  Menthol (BIOFREEZE) 5 % PTCH Apply 1 patch topically every morning. For neck pain    [provider]  metoprolol tartrate (LOPRESSOR) 50 MG tablet Take 50 mg by mouth 2 (two) times daily.    [provider]  Omega-3 Fatty Acids (FISH OIL) 1000 MG CAPS Take 1,000 mg by mouth 2 (two) times daily.    [provider]  Skin Protectants, Misc. (EUCERIN) cream Apply topically as needed for dry skin.    [provider]  SYMBICORT 80-4.5 MCG/ACT inhaler Inhale 2 puffs into the lungs 2 (two) times daily.    [provider]  Tiotropium Bromide Monohydrate (SPIRIVA RESPIMAT) 2.5 MCG/ACT AERS Inhale 2.5 mcg into the lungs every morning.    [provider]  vitamin B-12 (CYANOCOBALAMIN) 500 MCG tablet Take 500 mcg by mouth daily.    [provider]  VITAMIN D PO Take by mouth.    [provider]      Allergies    Guaifenesin, Kiwi extract, Levaquin [levofloxacin in d5w], Strawberry extract, and Baclofen    Review of Systems   Review of Systems  Constitutional:  Positive for chills and fatigue. Negative for diaphoresis and fever (chills present).  HENT:  Positive for congestion and rhinorrhea.   Eyes:  Negative for visual disturbance.  Respiratory:  Positive for cough and shortness of breath. Negative for chest tightness and wheezing.   Cardiovascular:  Negative for chest pain, palpitations and leg swelling.   Gastrointestinal:  Negative for abdominal pain (left flank pain present), constipation, diarrhea, nausea and vomiting.  Genitourinary:  Positive for dysuria and flank pain. Negative for frequency.  Musculoskeletal:  Positive for back pain (l cva area). Negative for neck pain and neck stiffness.  Skin:  Negative for rash and wound.  Neurological:  Positive for headaches (face pain). Negative for loss of consciousness, weakness, light-headedness and numbness.  Psychiatric/Behavioral:  Negative for agitation and confusion.   All other systems reviewed and are negative.   Physical Exam Updated Vital Signs LMP 10/20/2019  Physical Exam Vitals and nursing note reviewed.  Constitutional:      General: She is not in acute distress.    Appearance: She is well-developed. She is not ill-appearing, toxic-appearing or diaphoretic.  HENT:     Head: Normocephalic and atraumatic.     Nose: Nose normal.     Mouth/Throat:     Mouth: Mucous membranes are moist.  Pharynx: No oropharyngeal exudate or posterior oropharyngeal erythema.  Eyes:     Extraocular Movements: Extraocular movements intact.     Conjunctiva/sclera: Conjunctivae normal.     Pupils: Pupils are equal, round, and reactive to light.  Cardiovascular:     Rate and Rhythm: Normal rate and regular rhythm.     Heart sounds: No murmur heard. Pulmonary:     Effort: Pulmonary effort is normal. No respiratory distress.     Breath sounds: Rhonchi present. No wheezing or rales.  Chest:     Chest wall: No tenderness.  Abdominal:     Palpations: Abdomen is soft.     Tenderness: There is no abdominal tenderness. There is left CVA tenderness. There is no right CVA tenderness, guarding or rebound.  Musculoskeletal:        General: Tenderness present. No swelling.     Cervical back: Neck supple. No tenderness.     Right lower leg: No edema.     Left lower leg: No edema.  Skin:    General: Skin is warm and dry.     Capillary Refill:  Capillary refill takes less than 2 seconds.     Findings: No erythema or rash.  Neurological:     Mental Status: She is alert. Mental status is at baseline.  Psychiatric:        Mood and Affect: Mood normal.     ED Results / Procedures / Treatments   Labs (all labs ordered are listed, but only abnormal results are displayed) Labs Reviewed  CBC WITH DIFFERENTIAL/PLATELET - Abnormal; Notable for the following components:      Result Value   WBC 12.5 (*)    Lymphs Abs 4.8 (*)    Abs Immature Granulocytes 0.10 (*)    All other components within normal limits  BLOOD GAS, VENOUS - Abnormal; Notable for the following components:   Bicarbonate 33.9 (*)    Acid-Base Excess 6.5 (*)    All other components within normal limits  CK - Abnormal; Notable for the following components:   Total CK 27 (*)    All other components within normal limits  COMPREHENSIVE METABOLIC PANEL - Abnormal; Notable for the following components:   Glucose, Bld 266 (*)    BUN 34 (*)    Creatinine, Ser 1.75 (*)    Calcium 8.8 (*)    Albumin 3.3 (*)    GFR, Estimated 35 (*)    All other components within normal limits  CBG MONITORING, ED - Abnormal; Notable for the following components:   Glucose-Capillary 263 (*)    All other components within normal limits  CULTURE, BLOOD (ROUTINE X 2)  URINE CULTURE  CULTURE, BLOOD (ROUTINE X 2)  LACTIC ACID, PLASMA  LACTIC ACID, PLASMA  LIPASE, BLOOD  BETA-HYDROXYBUTYRIC ACID  URINALYSIS, ROUTINE W REFLEX MICROSCOPIC  I-STAT BETA HCG BLOOD, ED ([facility], [facility], [facility] ONLY)  TROPONIN I (HIGH SENSITIVITY)  TROPONIN I (HIGH SENSITIVITY)    EKG EKG Interpretation  Date/Time:  Thursday August 29 2022 18:53:15 EST Ventricular Rate:  83 PR Interval:  153 QRS Duration: 104 QT Interval:  384 QTC Calculation: 452 R Axis:   56 Text Interpretation: Sinus rhythm Low voltage, precordial leads when compared to prior, similar appearance. No STEMI Confirmed by Theda Belfast ([id number])  on 08/29/2022 6:58:35 PM  Radiology DG Chest Portable 1 View  Result Date: 08/29/2022 CLINICAL DATA:  Cough, hypoxia EXAM: PORTABLE CHEST 1 VIEW COMPARISON:  01/01/2022 FINDINGS: Heart and mediastinal contours are within normal  limits. No focal opacities or effusions. No acute bony abnormality. IMPRESSION: No active disease. Electronically Signed   By: Rolm Baptise M.D.   On: 08/29/2022 19:18    Procedures Procedures    Medications Ordered in ED Medications - No data to display  ED Course/ Medical Decision Making/ A&P                           Medical Decision Making Amount and/or Complexity of Data Reviewed Labs: ordered. Radiology: ordered.    Karla Hoover is a 51 y.o. female with a past medical history significant for previous stroke, hypertension, hypothyroidism, COPD and asthma with intermittent oxygen use at home, bilateral pulmonary emboli, recurrent urinary tract infection/pyelonephritis, CKD, hyperlipidemia, and GERD who presents for worsening fatigue, chills, muscle soreness, worsening dysuria, worsening left flank pain, dry cough, and malaise.  According to patient, she was diagnosed with UTI 3 days ago and has been on antibiotics but symptoms continue to worsen.  She reports that she tends to need oxygen when she has UTIs and is concerned that it is progressing to pyelonephritis again.  She reports that she has had more malaise and chills and fatigue but also has developed more of a cough.  She reports she has had negative COVID testing at home but is not been checked for flu.  She reports myalgias but denies any new leg swelling.  She reports she is been taking her blood thinners as directed and the antibiotics.  She reports no nausea, vomiting, constipation, or diarrhea.  Denies anterior abdominal pain but reports pain in her left CVA area and left flank.  No history of kidney stones.  No trauma.  No rashes reported.  She reports some pain in her face and has some  congestion.  EMS reports she needs to be on 4 L to keep her oxygen saturations in the 90s and her glucose was found to be in the 400s.  Unclear if there is a history of DKA or not.  On exam, lungs had coarseness in all lung fields and chest was nontender.  Abdomen was nontender but left flank and left CVA area was tender.  Patient has weakness on her left side that she reports is at her baseline and has no numbness.  No deficits neurologically from her baseline she reports.  Legs are nonedematous on my exam.  Rest exam remarkable.  Clinically I do feel we need to rule out pyelonephritis, pneumonia, DKA given the hyperglycemia.  We will get screening labs, will start infectious work-up including chest x-ray, urinalysis and culture, and will check for DKA with labs.  We will hold on imaging of the head or abdomen initially as she did not have any neck pain or neck stiffness and denies any trauma.  Suspect there is more pressure and congestion symptoms in her face.  Pupils are symmetric and reactive normal extraocular movements and she has symmetric smile and clear speech.  She denies history of kidney stones and she denies hematuria.  Suspect early pyelonephritis.  Anticipate reassessment after work-up to determine disposition.   Patient still awaiting results of urinalysis and COVID test.  Given patient's flank pain, chills, leukocytosis, AKI, and similar symptoms to previous pyelonephritis, anticipate she will need admission for pyelonephritis once urinalysis is completed if it shows infection.  Care transferred to oncoming team to await COVID test and urinalysis.  Anticipate possible admission if pyelonephritis is discovered in the setting of her  AKI.         Final Clinical Impression(s) / ED Diagnoses Final diagnoses:  AKI (acute kidney injury) ([facility])    Clinical Impression: 1. AKI (acute kidney injury) ([facility])     Disposition: Care transferred to oncoming team to await work-up results  and reassessment.  This note was prepared with assistance of Systems analyst. Occasional wrong-word or sound-a-like substitutions may have occurred due to the inherent limitations of voice recognition software.      Sunnie Odden, Gwenyth Allegra, MD 08/29/22 2356

## 2022-08-29 NOTE — ED Triage Notes (Signed)
Patient is from a facility. She has been experiencing body aches, fatigue and facial pain for 3 days. 3 days ago she was diagnosis with  UTI and is currently be treated.     HX: UTI   EMS  144/78 BP 83 HR 96% SPO2 4L nasal canula (O2 only when she has a UTI) 88% SPO2 on room air 480 CBG

## 2022-08-30 ENCOUNTER — Encounter (HOSPITAL_COMMUNITY): Payer: Self-pay | Admitting: Internal Medicine

## 2022-08-30 DIAGNOSIS — J4489 Other specified chronic obstructive pulmonary disease: Secondary | ICD-10-CM

## 2022-08-30 DIAGNOSIS — Z86711 Personal history of pulmonary embolism: Secondary | ICD-10-CM

## 2022-08-30 DIAGNOSIS — I69354 Hemiplegia and hemiparesis following cerebral infarction affecting left non-dominant side: Secondary | ICD-10-CM

## 2022-08-30 DIAGNOSIS — J9611 Chronic respiratory failure with hypoxia: Secondary | ICD-10-CM

## 2022-08-30 DIAGNOSIS — N179 Acute kidney failure, unspecified: Secondary | ICD-10-CM

## 2022-08-30 DIAGNOSIS — I10 Essential (primary) hypertension: Secondary | ICD-10-CM

## 2022-08-30 DIAGNOSIS — E663 Overweight: Secondary | ICD-10-CM

## 2022-08-30 DIAGNOSIS — N1831 Chronic kidney disease, stage 3a: Secondary | ICD-10-CM

## 2022-08-30 DIAGNOSIS — E039 Hypothyroidism, unspecified: Secondary | ICD-10-CM | POA: Diagnosis not present

## 2022-08-30 DIAGNOSIS — N390 Urinary tract infection, site not specified: Secondary | ICD-10-CM

## 2022-08-30 DIAGNOSIS — N3000 Acute cystitis without hematuria: Secondary | ICD-10-CM

## 2022-08-30 DIAGNOSIS — E1122 Type 2 diabetes mellitus with diabetic chronic kidney disease: Secondary | ICD-10-CM | POA: Diagnosis not present

## 2022-08-30 DIAGNOSIS — Z794 Long term (current) use of insulin: Secondary | ICD-10-CM

## 2022-08-30 HISTORY — DX: Acute kidney failure, unspecified: N17.9

## 2022-08-30 LAB — URINALYSIS, ROUTINE W REFLEX MICROSCOPIC
Bilirubin Urine: NEGATIVE
Glucose, UA: NEGATIVE mg/dL
Hgb urine dipstick: NEGATIVE
Ketones, ur: NEGATIVE mg/dL
Nitrite: NEGATIVE
Protein, ur: NEGATIVE mg/dL
Specific Gravity, Urine: 1.017 (ref 1.005–1.030)
pH: 5 (ref 5.0–8.0)

## 2022-08-30 LAB — COMPREHENSIVE METABOLIC PANEL
ALT: 18 U/L (ref 0–44)
AST: 26 U/L (ref 15–41)
Albumin: 3.4 g/dL — ABNORMAL LOW (ref 3.5–5.0)
Alkaline Phosphatase: 60 U/L (ref 38–126)
Anion gap: 7 (ref 5–15)
BUN: 31 mg/dL — ABNORMAL HIGH (ref 6–20)
CO2: 29 mmol/L (ref 22–32)
Calcium: 9.1 mg/dL (ref 8.9–10.3)
Chloride: 99 mmol/L (ref 98–111)
Creatinine, Ser: 1.44 mg/dL — ABNORMAL HIGH (ref 0.44–1.00)
GFR, Estimated: 44 mL/min — ABNORMAL LOW (ref >60)
Glucose, Bld: 191 mg/dL — ABNORMAL HIGH (ref 70–99)
Potassium: 3.8 mmol/L (ref 3.5–5.1)
Sodium: 135 mmol/L (ref 135–145)
Total Bilirubin: 0.5 mg/dL (ref 0.3–1.2)
Total Protein: 7.5 g/dL (ref 6.5–8.1)

## 2022-08-30 LAB — CBC WITH DIFFERENTIAL/PLATELET
Abs Immature Granulocytes: 0.08 10*3/uL — ABNORMAL HIGH (ref 0.00–0.07)
Basophils Absolute: 0 10*3/uL (ref 0.0–0.1)
Basophils Relative: 0 %
Eosinophils Absolute: 0.3 10*3/uL (ref 0.0–0.5)
Eosinophils Relative: 2 %
HCT: 39.1 % (ref 36.0–46.0)
Hemoglobin: 12.4 g/dL (ref 12.0–15.0)
Immature Granulocytes: 1 %
Lymphocytes Relative: 37 %
Lymphs Abs: 4.5 10*3/uL — ABNORMAL HIGH (ref 0.7–4.0)
MCH: 27.2 pg (ref 26.0–34.0)
MCHC: 31.7 g/dL (ref 30.0–36.0)
MCV: 85.7 fL (ref 80.0–100.0)
Monocytes Absolute: 0.6 10*3/uL (ref 0.1–1.0)
Monocytes Relative: 5 %
Neutro Abs: 6.8 10*3/uL (ref 1.7–7.7)
Neutrophils Relative %: 55 %
Platelets: 237 10*3/uL (ref 150–400)
RBC: 4.56 MIL/uL (ref 3.87–5.11)
RDW: 13 % (ref 11.5–15.5)
WBC: 12.3 10*3/uL — ABNORMAL HIGH (ref 4.0–10.5)
nRBC: 0 % (ref 0.0–0.2)

## 2022-08-30 LAB — HIV ANTIBODY (ROUTINE TESTING W REFLEX): HIV Screen 4th Generation wRfx: NONREACTIVE

## 2022-08-30 LAB — GLUCOSE, CAPILLARY
Glucose-Capillary: 186 mg/dL — ABNORMAL HIGH (ref 70–99)
Glucose-Capillary: 196 mg/dL — ABNORMAL HIGH (ref 70–99)

## 2022-08-30 LAB — MAGNESIUM: Magnesium: 1.9 mg/dL (ref 1.7–2.4)

## 2022-08-30 LAB — MRSA NEXT GEN BY PCR, NASAL: MRSA by PCR Next Gen: NOT DETECTED

## 2022-08-30 MED ORDER — TIOTROPIUM BROMIDE MONOHYDRATE 2.5 MCG/ACT IN AERS: 2.5000 ug | INHALATION_SPRAY | Freq: Every morning | RESPIRATORY_TRACT | Status: DC

## 2022-08-30 MED ORDER — UMECLIDINIUM BROMIDE 62.5 MCG/ACT IN AEPB
1.0000 | INHALATION_SPRAY | Freq: Every day | RESPIRATORY_TRACT | Status: DC
  Administered 2022-08-30: 1 via RESPIRATORY_TRACT
  Filled 2022-08-30: qty 7

## 2022-08-30 MED ORDER — BUSPIRONE HCL 5 MG PO TABS
5.0000 mg | ORAL_TABLET | Freq: Two times a day (BID) | ORAL | Status: DC
  Filled 2022-08-30: qty 1

## 2022-08-30 MED ORDER — LACTATED RINGERS IV SOLN: INTRAVENOUS | Status: DC

## 2022-08-30 MED ORDER — SACCHAROMYCES BOULARDII 250 MG PO CAPS
250.0000 mg | ORAL_CAPSULE | Freq: Two times a day (BID) | ORAL | Status: DC
  Administered 2022-08-30: 250 mg via ORAL
  Filled 2022-08-30: qty 1

## 2022-08-30 MED ORDER — METOPROLOL SUCCINATE ER 50 MG PO TB24
50.0000 mg | ORAL_TABLET | Freq: Every day | ORAL | Status: DC
  Administered 2022-08-30: 50 mg via ORAL
  Filled 2022-08-30: qty 1

## 2022-08-30 MED ORDER — GABAPENTIN 400 MG PO CAPS
400.0000 mg | ORAL_CAPSULE | Freq: Three times a day (TID) | ORAL | Status: DC
  Administered 2022-08-30: 400 mg via ORAL
  Filled 2022-08-30: qty 1

## 2022-08-30 MED ORDER — ONDANSETRON HCL 4 MG PO TABS: 4.0000 mg | ORAL_TABLET | Freq: Four times a day (QID) | ORAL | Status: DC | PRN

## 2022-08-30 MED ORDER — SODIUM CHLORIDE 0.9 % IV SOLN: 1.0000 g | INTRAVENOUS | Status: DC

## 2022-08-30 MED ORDER — ASPIRIN 81 MG PO CHEW
81.0000 mg | CHEWABLE_TABLET | Freq: Every day | ORAL | Status: DC
  Administered 2022-08-30: 81 mg via ORAL
  Filled 2022-08-30: qty 1

## 2022-08-30 MED ORDER — MELATONIN 5 MG PO TABS: 10.0000 mg | ORAL_TABLET | Freq: Every evening | ORAL | Status: DC | PRN

## 2022-08-30 MED ORDER — APIXABAN 5 MG PO TABS
5.0000 mg | ORAL_TABLET | Freq: Two times a day (BID) | ORAL | Status: DC
  Administered 2022-08-30: 5 mg via ORAL
  Filled 2022-08-30: qty 1

## 2022-08-30 MED ORDER — SODIUM CHLORIDE 0.9 % IV SOLN
1.0000 g | INTRAVENOUS | Status: DC
  Administered 2022-08-30: 1 g via INTRAVENOUS
  Filled 2022-08-30: qty 10

## 2022-08-30 MED ORDER — SALINE SPRAY 0.65 % NA SOLN
1.0000 | NASAL | Status: DC | PRN
  Filled 2022-08-30: qty 44

## 2022-08-30 MED ORDER — ACETAMINOPHEN 650 MG RE SUPP: 650.0000 mg | Freq: Four times a day (QID) | RECTAL | Status: DC | PRN

## 2022-08-30 MED ORDER — ATORVASTATIN CALCIUM 40 MG PO TABS: 80.0000 mg | ORAL_TABLET | Freq: Every day | ORAL | Status: DC

## 2022-08-30 MED ORDER — EZETIMIBE 10 MG PO TABS: 10.0000 mg | ORAL_TABLET | Freq: Every day | ORAL | Status: DC

## 2022-08-30 MED ORDER — DEXTROMETHORPHAN POLISTIREX ER 30 MG/5ML PO SUER
30.0000 mg | Freq: Two times a day (BID) | ORAL | Status: DC | PRN
  Administered 2022-08-30: 30 mg via ORAL
  Filled 2022-08-30 (×2): qty 5

## 2022-08-30 MED ORDER — LEVOTHYROXINE SODIUM 50 MCG PO TABS
75.0000 ug | ORAL_TABLET | Freq: Every day | ORAL | Status: DC
  Administered 2022-08-30: 75 ug via ORAL
  Filled 2022-08-30: qty 1

## 2022-08-30 MED ORDER — IPRATROPIUM-ALBUTEROL 0.5-2.5 (3) MG/3ML IN SOLN
3.0000 mL | Freq: Three times a day (TID) | RESPIRATORY_TRACT | Status: DC | PRN
  Administered 2022-08-30: 3 mL via RESPIRATORY_TRACT
  Filled 2022-08-30: qty 3

## 2022-08-30 MED ORDER — LACTATED RINGERS IV BOLUS
1000.0000 mL | Freq: Once | INTRAVENOUS | Status: AC
  Administered 2022-08-30: 1000 mL via INTRAVENOUS

## 2022-08-30 MED ORDER — ACETAMINOPHEN 325 MG PO TABS
650.0000 mg | ORAL_TABLET | Freq: Four times a day (QID) | ORAL | Status: DC | PRN
  Administered 2022-08-30: 650 mg via ORAL
  Filled 2022-08-30: qty 2

## 2022-08-30 MED ORDER — INSULIN GLARGINE-YFGN 100 UNIT/ML ~~LOC~~ SOLN
20.0000 [IU] | Freq: Two times a day (BID) | SUBCUTANEOUS | Status: DC
  Administered 2022-08-30 (×2): 20 [IU] via SUBCUTANEOUS
  Filled 2022-08-30 (×3): qty 0.2

## 2022-08-30 MED ORDER — PANTOPRAZOLE SODIUM 40 MG PO TBEC
40.0000 mg | DELAYED_RELEASE_TABLET | Freq: Every day | ORAL | Status: DC
  Administered 2022-08-30: 40 mg via ORAL
  Filled 2022-08-30: qty 1

## 2022-08-30 MED ORDER — MOMETASONE FURO-FORMOTEROL FUM 100-5 MCG/ACT IN AERO
2.0000 | INHALATION_SPRAY | Freq: Two times a day (BID) | RESPIRATORY_TRACT | Status: DC
  Administered 2022-08-30: 2 via RESPIRATORY_TRACT
  Filled 2022-08-30: qty 8.8

## 2022-08-30 MED ORDER — ONDANSETRON HCL 4 MG/2ML IJ SOLN: 4.0000 mg | Freq: Four times a day (QID) | INTRAMUSCULAR | Status: DC | PRN

## 2022-08-30 NOTE — Assessment & Plan Note (Signed)
-   non toxic appearing; UA only with trace LE; negative nitrite, rare bacteria, 6-10 WBC. She has been treated with oral abx prior to admission and rocephin x 1 on admission; no further abx considered indicated at this time - remains afebrile

## 2022-08-30 NOTE — TOC Initial Note (Signed)
Transition of Care [facility]) - Initial/Assessment Note    Patient Details  Name: Karla Hoover MRN: [id number] Date of Birth: 1971-03-15  Transition of Care (TOC) CM/SW Contact:    Armanda Heritage, RN Phone Number: 08/30/2022, 9:57 AM  Clinical Narrative:                  Patient is a LTC resident at [facility], CM spoke with facility rep who indicated patient can return at discharge.  Per MD patient is stable for DC, will discharge to room 305 at [facility], bedside RN please call report to [phone].  [facility] transport arranged.  Facility indicates no new FL2 needed and will pull DC summary directly from EMR. No further TOC needs identified.   Expected Discharge Plan: Skilled Nursing Facility Barriers to Discharge: No Barriers Identified   Patient Goals and CMS Choice Patient states their goals for this hospitalization and ongoing recovery are:: to [facility] to [email or web address] Compare Post Acute Care list provided to:: Patient Choice offered to / list presented to : Patient  Expected Discharge Plan and Services Expected Discharge Plan: Skilled Nursing Facility   Discharge Planning Services: CM Consult Post Acute Care Choice: Resumption of Svcs/PTA Provider Living arrangements for the past 2 months: Skilled Nursing Facility Expected Discharge Date: 08/30/22               DME Arranged: N/A DME Agency: NA       HH Arranged: NA          Prior Living Arrangements/Services Living arrangements for the past 2 months: Skilled Nursing Facility Lives with:: Facility Resident Patient language and need for interpreter reviewed:: Yes Do you feel safe going back to the place where you live?: Yes      Need for Family Participation in Patient Care: No (Comment) Care giver support system in place?: Yes (comment)   Criminal Activity/Legal Involvement Pertinent to Current Situation/Hospitalization: No - Comment as needed  Activities of Daily Living Home  Assistive Devices/Equipment: Oxygen ADL Screening (condition at time of admission) Patient's cognitive ability adequate to safely complete daily activities?: Yes Is the patient deaf or have difficulty hearing?: No Does the patient have difficulty seeing, even when wearing glasses/contacts?: No Does the patient have difficulty concentrating, remembering, or making decisions?: No Patient able to express need for assistance with ADLs?: Yes Does the patient have difficulty dressing or bathing?: No Independently performs ADLs?: No Communication: Independent Dressing (OT): Independent Grooming: Independent Feeding: Independent Bathing: Needs assistance Is this a change from baseline?: Pre-admission baseline Toileting: Needs assistance Is this a change from baseline?: Pre-admission baseline Walks in Home: Needs assistance Is this a change from baseline?: Pre-admission baseline Does the patient have difficulty walking or climbing stairs?: No Weakness of Legs: None Weakness of Arms/Hands: None  Permission Sought/Granted                  Emotional Assessment Appearance:: Appears stated age         Psych Involvement: No (comment)  Admission diagnosis:  AKI (acute kidney injury) (HCC) [N17.9] Urinary tract infection without hematuria, site unspecified [N39.0] Patient Active Problem List   Diagnosis Date Noted   AKI (acute kidney injury) (HCC) 08/30/2022   History of pulmonary embolism 08/30/2022   Hemiparesis affecting left side as late effect of cerebrovascular accident (CVA) (HCC) 08/30/2022   Allergies    Grade I diastolic dysfunction 12/02/2021   Mild protein-calorie malnutrition (HCC) 12/02/2021   COPD with acute exacerbation (HCC) 12/02/2021  Acute renal failure superimposed on stage 3a chronic kidney disease (HCC) 02/09/2021   Chronic respiratory failure with hypoxia (HCC) 02/09/2021   History of thromboembolism 02/09/2021   Type 2 diabetes mellitus with stage 3a  chronic kidney disease, with long-term current use of insulin (HCC) 02/09/2021   Mixed 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 (HCC) 02/09/2021   GERD without esophagitis 02/09/2021   Hypothyroidism 02/09/2021   Lymphadenopathy 12/28/2020   VTE (venous thromboembolism) 12/01/2019   Overweight (BMI 25.0-29.9) 11/22/2019   Chronic constipation 11/22/2019   Tobacco abuse 11/16/2019   DM (diabetes mellitus), type 2 (HCC) 11/15/2019   History of stroke 11/15/2019   Essential hypertension 05/04/2009   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   PCP:  Pcp, No Pharmacy:  No Pharmacies Listed    Social Determinants of Health (SDOH) Interventions    Readmission Risk Interventions    01/04/2022    2:25 PM 12/03/2021    9:55 AM  Readmission Risk Prevention Plan  Transportation Screening Complete Complete  PCP or Specialist Appt within 3-5 Days Complete Complete  HRI or Home Care Consult Complete Complete  Social Work Consult for Recovery Care Planning/Counseling  Complete  Palliative Care Screening Not Applicable Complete  Medication Review Oceanographer) Complete Complete

## 2022-08-30 NOTE — Subjective & Objective (Addendum)
UG:QBVQ aches, facial pain HPI: 51 year old Caucasian female history of stroke with dense left-sided hemiparesis, type 2 diabetes, hypertension, CKD stage IIIa, history of PE on Eliquis, chronic bronchitis, obesity, hypothyroidism presents to the ER today with complaints of bodyaches, fatigue and facial pain for 3 days.  According to nursing home records, she was started on cefdinir 300 mg twice a day starting on 08/26/2022.  Patient does not know she has been having any fevers.  She does complain of some chills.  She is intermittently on oxygen according to the nursing home records.  She states that she only uses oxygen when she has a UTI which I find strange is an indication for her to be on oxygen.  She does have a history of chronic bronchitis.  On arrival temp 97.9, heart rate 83, blood pressure 125/91 satting 99% on room air.  UA shows trace leukocyte esterase 6-10 WBCs rare bacteria.  Lactic acid was 1.4.  Total CK of 27.  Sodium 138, [place]man 3.9, BUN of 34, current 1.75  Lipase normal at 42  White count 12.5, hemoglobin 13, platelet 255  Chest x-ray showed no acute process.  Due to the patient's acute kidney injury, [facility] hospitalist contacted for admission.  Patient rescinds her prior DO NOT RESUSCITATE order.  She desires to be a full code.

## 2022-08-30 NOTE — Assessment & Plan Note (Addendum)
-   resume home regimen 

## 2022-08-30 NOTE — Assessment & Plan Note (Signed)
Chronic. 

## 2022-08-30 NOTE — Discharge Summary (Signed)
Physician Discharge Summary   Karla Hoover:[id number] DOB: [birth year] DOA: 08/29/2022  PCP: Oneita Hurt, No  Admit date: 08/29/2022 Discharge date: 08/30/2022 Barriers to discharge: none  Admitted From: SNF Disposition:  SNF Discharging physician: Lewie Chamber, MD  Recommendations for Outpatient Follow-up:  Continue chronic management  Home Health:  Equipment/Devices:   Discharge Condition: stable CODE STATUS: Full Diet recommendation:  Diet Orders (From admission, onward)     Start     Ordered   08/30/22 0227  Diet heart healthy/carb modified Room service appropriate? Yes; Fluid consistency: Thin  Diet effective now       Question Answer Comment  Diet-HS Snack? Nothing   Room service appropriate? Yes   Fluid consistency: Thin      08/30/22 0226   08/30/22 0000  Diet - low sodium heart healthy        08/30/22 0946   08/30/22 0000  Diet Carb Modified        08/30/22 0946            Hospital Course:  * AKI (acute kidney injury) (HCC) - baseline creatinine ~ 1 - patient presents with increase in creat >0.3 mg/dL above baseline, creat increase >1.5x baseline presumed to have occurred within past 7 days PTA - suspected component of prerenal due to poor intake and overdiuresis - hctz held on admission and IVF started - creat 1.75 on admission, improved to 1.44 at discharge - continue good intake - okay to resume home meds  Hypothyroidism Stable.  Continue Synthroid 75 mcg daily.  Type 2 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 disease, with long-term current use of insulin (HCC) - resume home regimen   Chronic respiratory failure with hypoxia (HCC) Patient's on as needed supplemental oxygen according to nursing home records.  Acute renal failure superimposed on stage 3a chronic kidney disease (HCC) See above  UTI (urinary tract infection)-resolved as of 08/30/2022 - non toxic appearing; UA only with trace LE; negative nitrite, rare bacteria, 6-10  WBC. She has been treated with oral abx prior to admission and rocephin x 1 on admission; no further abx considered indicated at this time - remains afebrile   Hemiparesis affecting left side as late effect of cerebrovascular accident (CVA) (HCC) Chronic.  History of pulmonary embolism Stable.  Continue Eliquis 5 mg twice daily.  Overweight (BMI 25.0-29.9) Chronic.  COPD with chronic bronchitis (HCC) Chronic.  Continue inhalers.   Essential hypertension - resume home regimen   Principal Diagnosis: AKI (acute kidney injury) [facility])  Discharge Diagnoses: Active Hospital Problems   Diagnosis Date Noted   AKI (acute kidney injury) (HCC) 08/30/2022    Priority: High   Acute renal failure superimposed on stage 3a chronic kidney disease (HCC) 02/09/2021    Priority: Medium    Chronic respiratory failure with hypoxia (HCC) 02/09/2021    Priority: Medium    Type 2 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 disease, with long-term current use of insulin (HCC) 02/09/2021    Priority: Medium    Hypothyroidism 02/09/2021    Priority: Medium    History of pulmonary embolism 08/30/2022    Priority: Low   Hemiparesis affecting left side as late effect of cerebrovascular accident (CVA) (HCC) 08/30/2022    Priority: Low   Overweight (BMI 25.0-29.9) 11/22/2019    Priority: Low   Essential hypertension 05/04/2009    Priority: Low   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    Priority: Low    Resolved Hospital Problems  Diagnosis Date Noted Date Resolved   UTI (urinary tract infection) 08/30/2022 08/30/2022    Priority: Medium      Discharge Instructions     Diet - low sodium heart healthy   Complete by: As directed    Diet Carb Modified   Complete by: As directed    Increase activity slowly   Complete by: As directed       Allergies as of 08/30/2022       Reactions   Guaifenesin Anaphylaxis   Kiwi Extract Anaphylaxis, Rash   Levaquin [levofloxacin In D5w]  Shortness Of Breath, Rash   Tolerated PO Cipro 01/04/22   Strawberry Extract Anaphylaxis, Rash   Baclofen Other (See Comments)   Near syncope/ fall        Medication List     TAKE these medications    acetaminophen 500 MG tablet Commonly known as: TYLENOL Take 1,000 mg by mouth in the morning, at noon, and at bedtime.   albuterol 108 (90 Base) MCG/ACT inhaler Commonly known as: VENTOLIN HFA Inhale 2 puffs into the lungs every 6 (six) hours as needed for wheezing or shortness of breath. What changed:  when to take this additional instructions   apixaban 5 MG Tabs tablet Commonly known as: Eliquis Take 2 tablets (10mg ) twice daily for 7 days, then 1 tablet (5mg ) twice daily What changed:  how much to take how to take this when to take this additional instructions   aspirin 81 MG chewable tablet Chew 81 mg by mouth in the morning.   atorvastatin 80 MG tablet Commonly known as: LIPITOR Take 1 tablet (80 mg total) by mouth daily. What changed: when to take this   B Complex-Biotin-FA Tabs Take 1 tablet by mouth in the morning.   busPIRone 5 MG tablet Commonly known as: BUSPAR Take 5 mg by mouth in the morning and at bedtime.   chlorhexidine 0.12 % solution Commonly known as: PERIDEX Use as directed 15 mLs in the mouth or throat every morning. Rinse and spit after oral care. Do not swallow.   Cranberry 250 MG Tabs Take 250 mg by mouth at bedtime.   cyclobenzaprine 10 MG tablet Commonly known as: FLEXERIL Take 1 tablet (10 mg total) by mouth 3 (three) times daily as needed for muscle spasms. What changed: when to take this   EPINEPHrine 1 MG/ML Sosy Inject 1 mg into the muscle as needed (for an allergic reaction).   ergocalciferol 1.25 MG (50000 UT) capsule Commonly known as: VITAMIN D2 Take 50,000 Units by mouth every Thursday.   eucerin cream Apply 1 Application topically See admin instructions. Apply to dry skin daily and as needed   ezetimibe 10 MG  tablet Commonly known as: ZETIA Take 10 mg by mouth at bedtime.   Fish Oil 1000 MG Caps Take 1,000 mg by mouth 3 (three) times daily.   gabapentin 400 MG capsule Commonly known as: NEURONTIN Take 400 mg by mouth 3 (three) times daily. What changed: Another medication with the same name was removed. Continue taking this medication, and follow the directions you see here.   guaiFENesin 600 MG 12 hr tablet Commonly known as: MUCINEX Take by mouth 2 (two) times daily.   insulin glargine 100 UNIT/ML injection Commonly known as: LANTUS Inject 40 Units into the skin 2 (two) times daily.   insulin lispro 100 UNIT/ML injection Commonly known as: HUMALOG Inject 0-14 Units into the skin See admin instructions. Inject 0-14 units twice daily (10am and 6pm) per  sliding scale: CBG 0-200=0/no insulin; 201-250=2 units; 251-300=4 units; 301-350=6 units; 351-400=8 units: 401-450= 10 units; 451-500=12 units/ if BS reads high, inject 14 units   ipratropium-albuterol 0.5-2.5 (3) MG/3ML Soln Commonly known as: DUONEB Inhale 1 vial via nebulizer 3 (three) times daily. What changed:  when to take this reasons to take this   lansoprazole 30 MG capsule Commonly known as: PREVACID Take 30 mg by mouth every morning.   levothyroxine 75 MCG tablet Commonly known as: SYNTHROID Take 75 mcg by mouth at bedtime.   losartan-hydrochlorothiazide 100-25 MG tablet Commonly known as: HYZAAR Take 1 tablet by mouth every morning.   Melatonin 5 MG Subl Place 5 mg under the tongue at bedtime.   Menthol (Topical Analgesic) 5 % Gel Apply 1 application  topically See admin instructions. Apply to the neck every morning   metoprolol succinate 50 MG 24 hr tablet Commonly known as: TOPROL-XL Take 50 mg by mouth daily. Take with or immediately following a meal.   Nasal Moist Gel Place 1 application  into both nostrils daily.   OXYGEN Inhale 2 L/min into the lungs as needed (for shortness of breath).    saccharomyces boulardii 250 MG capsule Commonly known as: FLORASTOR Take 250 mg by mouth 2 (two) times daily.   Spiriva Respimat 2.5 MCG/ACT Aers Generic drug: Tiotropium Bromide Monohydrate Inhale 2.5 mcg into the lungs in the morning.   Symbicort 80-4.5 MCG/ACT inhaler Generic drug: budesonide-formoterol Inhale 2 puffs into the lungs 2 (two) times daily.   Trulicity 0.75 MG/0.5ML Sopn Generic drug: Dulaglutide Inject 0.75 mg into the skin every Thursday.        Allergies  Allergen Reactions   Guaifenesin Anaphylaxis   Kiwi Extract Anaphylaxis and Rash   Levaquin [Levofloxacin In D5w] Shortness Of Breath and Rash    Tolerated PO Cipro 01/04/22   Strawberry Extract Anaphylaxis and Rash   Baclofen Other (See Comments)    Near syncope/ fall    Consultations:   Procedures:   Discharge Exam: BP (!) 146/81 (BP Location: Right Arm)   Pulse 91   Temp 98.7 F (37.1 C) (Oral)   Resp 16   LMP 10/20/2019   SpO2 90%  Physical Exam Constitutional:      General: She is not in acute distress. HENT:     Head: Normocephalic and atraumatic.     Mouth/Throat:     Mouth: Mucous membranes are moist.  Eyes:     Extraocular Movements: Extraocular movements intact.  Cardiovascular:     Rate and Rhythm: Normal rate and regular rhythm.  Pulmonary:     Effort: Pulmonary effort is normal.     Breath sounds: Normal breath sounds.  Abdominal:     General: Bowel sounds are normal. There is no distension.     Palpations: Abdomen is soft.     Tenderness: There is no abdominal tenderness.  Musculoskeletal:        General: Normal range of motion.     Cervical back: Normal range of motion and neck supple.  Skin:    General: Skin is warm.  Neurological:     Mental Status: She is alert. Mental status is at baseline.  Psychiatric:        Mood and Affect: Mood normal.      The results of significant diagnostics from this hospitalization (including imaging, microbiology, ancillary  and laboratory) are listed below for reference.   Microbiology: Recent Results (from the past 240 hour(s))  Blood culture (routine x 2)  Status: None (Preliminary result)   Collection Time: 08/29/22  6:35 PM   Specimen: BLOOD RIGHT FOREARM  Result Value Ref Range Status   Specimen Description   Final    BLOOD RIGHT FOREARM Performed at [facility], [address], [place], [place] [address]    Special Requests   Final    BOTTLES DRAWN AEROBIC AND ANAEROBIC Blood Culture adequate volume Performed at [facility], [address] [place], [place] [address]    Culture   Final    NO GROWTH < 12 HOURS Performed at [facility], [address] [place], [place] [address]    Report Status PENDING  Incomplete  Blood culture (routine x 2)     Status: None (Preliminary result)   Collection Time: 08/29/22  8:32 PM   Specimen: BLOOD  Result Value Ref Range Status   Specimen Description   Final    BLOOD SITE NOT SPECIFIED Performed at [facility], [address], [place], [place] [address]    Special Requests   Final    BOTTLES DRAWN AEROBIC AND ANAEROBIC Blood Culture adequate volume Performed at [facility], [address], [place], [place] [address]    Culture   Final    NO GROWTH < 12 HOURS Performed at [facility], [address] [place], [place] [address]    Report Status PENDING  Incomplete  MRSA Next Gen by PCR, Nasal     Status: None   Collection Time: 08/30/22  5:30 AM   Specimen: Nasal Mucosa; Nasal Swab  Result Value Ref Range Status   MRSA by PCR Next Gen NOT DETECTED NOT DETECTED Final    Comment: (NOTE) The GeneXpert MRSA Assay (FDA approved for NASAL specimens only), is one component of a comprehensive MRSA colonization surveillance program. It is not intended to diagnose MRSA infection nor to guide or monitor treatment for MRSA infections. Test performance is not FDA approved in  patients less than 37 years old. Performed at [facility], [address] [place], [place] [address]      Labs: BNP (last 3 results) No results for input(s): "BNP" in the last 8760 hours. Basic Metabolic Panel: Recent Labs  Lab 08/29/22 1930 08/30/22 0543  NA 138 135  K 3.9 3.8  CL 102 99  CO2 27 29  GLUCOSE 266* 191*  BUN 34* 31*  CREATININE 1.75* 1.44*  CALCIUM 8.8* 9.1  MG  --  1.9   Liver Function Tests: Recent Labs  Lab 08/29/22 1930 08/30/22 0543  AST 24 26  ALT 16 18  ALKPHOS 57 60  BILITOT 0.5 0.5  PROT 7.3 7.5  ALBUMIN 3.3* 3.4*   Recent Labs  Lab 08/29/22 1930  LIPASE 42   No results for input(s): "AMMONIA" in the last 168 hours. CBC: Recent Labs  Lab 08/29/22 1835 08/30/22 0543  WBC 12.5* 12.3*  NEUTROABS 6.8 6.8  HGB 13.0 12.4  HCT 41.8 39.1  MCV 87.4 85.7  PLT 255 237   Cardiac Enzymes: Recent Labs  Lab 08/29/22 1930  CKTOTAL 27*   BNP: Invalid input(s): "POCBNP" CBG: Recent Labs  Lab 08/29/22 1941 08/30/22 0250 08/30/22 0759  GLUCAP 263* 186* 196*   D-Dimer No results for input(s): "DDIMER" in the last 72 hours. Hgb A1c No results for input(s): "HGBA1C" in the last 72 hours. Lipid Profile No results for input(s): "CHOL", "HDL", "LDLCALC", "TRIG", "CHOLHDL", "LDLDIRECT" in the last 72  hours. Thyroid function studies No results for input(s): "TSH", "T4TOTAL", "T3FREE", "THYROIDAB" in the last 72 hours.  Invalid input(s): "FREET3" Anemia work up No results for input(s): "VITAMINB12", "FOLATE", "FERRITIN", "TIBC", "IRON", "RETICCTPCT" in the last 72 hours. Urinalysis    Component Value Date/Time   COLORURINE YELLOW 08/30/2022 0024   APPEARANCEUR CLEAR 08/30/2022 0024   APPEARANCEUR Clear 11/24/2012 1238   LABSPEC 1.017 08/30/2022 0024   LABSPEC 1.000 11/24/2012 1238   PHURINE 5.0 08/30/2022 0024   GLUCOSEU NEGATIVE 08/30/2022 0024   GLUCOSEU >=500 11/24/2012 1238   HGBUR NEGATIVE 08/30/2022  0024   BILIRUBINUR NEGATIVE 08/30/2022 0024   BILIRUBINUR Negative 11/24/2012 1238   KETONESUR NEGATIVE 08/30/2022 0024   PROTEINUR NEGATIVE 08/30/2022 0024   UROBILINOGEN 1.0 04/30/2010 0902   NITRITE NEGATIVE 08/30/2022 0024   LEUKOCYTESUR TRACE (A) 08/30/2022 0024   LEUKOCYTESUR Negative 11/24/2012 1238   Sepsis Labs Recent Labs  Lab 08/29/22 1835 08/30/22 0543  WBC 12.5* 12.3*   Microbiology Recent Results (from the past 240 hour(s))  Blood culture (routine x 2)     Status: None (Preliminary result)   Collection Time: 08/29/22  6:35 PM   Specimen: BLOOD RIGHT FOREARM  Result Value Ref Range Status   Specimen Description   Final    BLOOD RIGHT FOREARM Performed at [facility], [address] [place], [place] [address]    Special Requests   Final    BOTTLES DRAWN AEROBIC AND ANAEROBIC Blood Culture adequate volume Performed at [facility], [address], [place], [place] [address]    Culture   Final    NO GROWTH < 12 HOURS Performed at [facility], [address], [place], [place] [address]    Report Status PENDING  Incomplete  Blood culture (routine x 2)     Status: None (Preliminary result)   Collection Time: 08/29/22  8:32 PM   Specimen: BLOOD  Result Value Ref Range Status   Specimen Description   Final    BLOOD SITE NOT SPECIFIED Performed at [facility], [address] [place], [place] [address]    Special Requests   Final    BOTTLES DRAWN AEROBIC AND ANAEROBIC Blood Culture adequate volume Performed at [facility], [address] [place], [place] [address]    Culture   Final    NO GROWTH < 12 HOURS Performed at [facility], [address], [place], [place] [address]    Report Status PENDING  Incomplete  MRSA Next Gen by PCR, Nasal     Status: None   Collection Time: 08/30/22  5:30 AM   Specimen: Nasal Mucosa; Nasal Swab  Result Value Ref Range Status   MRSA by PCR  Next Gen NOT DETECTED NOT DETECTED Final    Comment: (NOTE) The GeneXpert MRSA Assay (FDA approved for NASAL specimens only), is one component of a comprehensive MRSA colonization surveillance program. It is not intended to diagnose MRSA infection nor to guide or monitor treatment for MRSA infections. Test performance is not FDA approved in patients less than 56 years old. Performed at [facility] [place], [address] [place], [place] [address]     Procedures/Studies: DG Chest Portable 1 View  Result Date: 08/29/2022 CLINICAL DATA:  Cough, hypoxia EXAM: PORTABLE CHEST 1 VIEW COMPARISON:  01/01/2022 FINDINGS: Heart and mediastinal contours are within normal limits. No focal opacities or effusions. No acute bony abnormality. IMPRESSION: No active disease. Electronically Signed  By: Charlett Nose M.D.   On: 08/29/2022 19:18     Time coordinating discharge: Over 30 minutes    Lewie Chamber, MD  [facility] 08/30/2022, 9:51 AM

## 2022-08-30 NOTE — Assessment & Plan Note (Signed)
Stable.  Continue Eliquis 5 mg twice daily. 

## 2022-08-30 NOTE — Assessment & Plan Note (Addendum)
-   baseline creatinine ~ 1 - patient presents with increase in creat >0.3 mg/dL above baseline, creat increase >1.5x baseline presumed to have occurred within past 7 days PTA - suspected component of prerenal due to poor intake and overdiuresis - hctz held on admission and IVF started - creat 1.75 on admission, improved to 1.44 at discharge - continue good intake - okay to resume home meds

## 2022-08-30 NOTE — Assessment & Plan Note (Signed)
Patient's on as needed supplemental oxygen according to nursing home records.

## 2022-08-30 NOTE — H&P (Signed)
History and Physical    MAGGEE HARSHBARGER [id number] DOB: [birth year] DOA: 08/29/2022  DOS: the patient was seen and examined on 08/29/2022  PCP: Pcp, No   Patient coming from: [facility]  I have personally briefly reviewed patient's old medical records in [place]  [id number] aches, facial pain HPI: 51 year old Caucasian female history of stroke with dense left-sided hemiparesis, type 2 diabetes, hypertension, CKD stage IIIa, history of PE on Eliquis, chronic bronchitis, obesity, hypothyroidism presents to the ER today with complaints of bodyaches, fatigue and facial pain for 3 days.  According to nursing home records, she was started on cefdinir 300 mg twice a day starting on 08/26/2022.  Patient does not know she has been having any fevers.  She does complain of some chills.  She is intermittently on oxygen according to the nursing home records.  She states that she only uses oxygen when she has a UTI which I find strange is an indication for her to be on oxygen.  She does have a history of chronic bronchitis.  On arrival temp 97.9, heart rate 83, blood pressure 125/91 satting 99% on room air.  UA shows trace leukocyte esterase 6-10 WBCs rare bacteria.  Lactic acid was 1.4.  Total CK of 27.  Sodium 138, [place] 3.9, BUN of 34, current 1.75  Lipase normal at 42  White count 12.5, hemoglobin 13, platelet 255  Chest x-ray showed no acute process.  Due to the patient's acute kidney injury, [facility] hospital[facility] contacted for admission.  Patient rescinds her prior DO NOT RESUSCITATE order.  She desires to be a full code.    ED Course: afebrile. WBC 12.5, UA trace LE  Review of Systems:  Review of Systems  Constitutional:  Positive for malaise/fatigue.  HENT:         Facial pain  Eyes: Negative.   Respiratory:  Positive for cough.   Cardiovascular: Negative.   Gastrointestinal: Negative.   Genitourinary: Negative.   Musculoskeletal:  Positive for myalgias.   Neurological:        Chronic dense left hemiparesis  Endo/Heme/Allergies: Negative.   Psychiatric/Behavioral: Negative.    All other systems reviewed and are negative.   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   Former smoker 36 pack year smoking history     Diabetes mellitus without complication ([place])    Essential hypertension 05/04/2009   Qualifier: Diagnosis of  By: Quentin Cornwall CMA, Jessica     GERD without esophagitis 02/09/2021   Grade I diastolic dysfunction [id number]   Hypertension    Hypothyroidism 02/09/2021   Mixed 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 ([place]) 02/09/2021   Pulmonary embolism ([place]) 11/16/2019   Stroke ([place])    Type 2 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 disease, with long-term current use of insulin ([place]) 02/09/2021    History reviewed. No pertinent surgical history.   reports that she quit smoking about 2 years ago. Her smoking use included cigarettes. She started smoking about 38 years ago. She has a 18.00 pack-year smoking history. She has never used smokeless tobacco. She reports that she does not drink alcohol and does not use drugs.  Allergies  Allergen Reactions   Guaifenesin Anaphylaxis   Kiwi Extract Anaphylaxis  and Rash   Levaquin [Levofloxacin In D5w] Shortness Of Breath and Rash    Tolerated PO Cipro 01/04/22   Strawberry Extract Anaphylaxis and Rash   Baclofen Other (See Comments)    Near syncope/ fall    Family History  Problem Relation Age of Onset   Diabetes Mother    COPD Sister    Heart failure Sister     Prior to Admission medications   Medication Sig Start Date End Date Taking? Authorizing Provider  acetaminophen (TYLENOL) 500  MG tablet Take 1,000 mg by mouth in the morning, at noon, and at bedtime.   Yes [provider]  albuterol (VENTOLIN HFA) 108 (90 Base) MCG/ACT inhaler Inhale 2 puffs into the lungs every 6 (six) hours as needed for wheezing or shortness of breath. Patient taking differently: Inhale 2 puffs into the lungs See admin instructions. Inhale 2 puffs into the lungs two times a day and an additional 2 puffs twice a day as needed for shortness of breath or wheezing 10/04/19  Yes Kerin Perna, NP  apixaban (ELIQUIS) 5 MG TABS tablet Take 2 tablets (10mg ) twice daily for 7 days, then 1 tablet (5mg ) twice daily Patient taking differently: Take 5 mg by mouth 2 (two) times daily. 11/21/19  Yes Kayleen Memos, DO  aspirin 81 MG chewable tablet Chew 81 mg by mouth in the morning.   Yes [provider]  atorvastatin (LIPITOR) 80 MG tablet Take 1 tablet (80 mg total) by mouth daily. Patient taking differently: Take 80 mg by mouth at bedtime. 10/04/19  Yes Kerin Perna, NP  B Complex-Biotin-FA TABS Take 1 tablet by mouth in the morning.   Yes [provider]  busPIRone (BUSPAR) 5 MG tablet Take 5 mg by mouth in the morning and at bedtime.   Yes [provider]  cefdinir (OMNICEF) 300 MG capsule Take 300 mg by mouth 2 (two) times daily. 08/26/22 09/03/22 Yes [provider]  chlorhexidine (PERIDEX) 0.12 % solution Use as directed 15 mLs in the mouth or throat every morning. Rinse and spit after oral care. Do not swallow.   Yes [provider]  Cranberry 250 MG TABS Take 250 mg by mouth at bedtime.   Yes [provider]  cyclobenzaprine (FLEXERIL) 10 MG tablet Take 1 tablet (10 mg total) by mouth 3 (three) times daily as needed for muscle spasms. Patient taking differently: Take 10 mg by mouth every 8 (eight) hours as needed for muscle spasms. 10/04/19  Yes Kerin Perna, NP  Dulaglutide (TRULICITY) [id number] SOPN Inject 0.75 mg into the  skin every Thursday.   Yes [provider]  EPINEPHrine 1 MG/ML SOSY Inject 1 mg into the muscle as needed (for an allergic reaction).   Yes [provider]  ergocalciferol (VITAMIN D2) 1.25 MG (50000 UT) capsule Take 50,000 Units by mouth every Thursday.   Yes [provider]  ezetimibe (ZETIA) 10 MG tablet Take 10 mg by mouth at bedtime.   Yes [provider]  gabapentin (NEURONTIN) 400 MG capsule Take 400 mg by mouth 3 (three) times daily.   Yes [provider]  guaiFENesin (MUCINEX) 600 MG 12 hr tablet Take by mouth 2 (two) times daily. 08/26/22 09/06/22 Yes [provider]  insulin glargine (LANTUS) 100 UNIT/ML injection Inject 40 Units into the skin 2 (two) times daily.   Yes [provider]  insulin lispro (HUMALOG) 100 UNIT/ML injection Inject 0-14 Units into the skin See admin  instructions. Inject 0-14 units twice daily (10am and 6pm) per sliding scale: CBG 0-200=0/no insulin; 201-250=2 units; 251-300=4 units; 301-350=6 units; 351-400=8 units: 401-450= 10 units; 451-500=12 units/ if BS reads high, inject 14 units   Yes [provider]  ipratropium-albuterol (DUONEB) 0.5-2.5 (3) MG/3ML SOLN Inhale 1 vial via nebulizer 3 (three) times daily. Patient taking differently: Take 3 mLs by nebulization 3 (three) times daily as needed (for shortness of breath or wheezing). 12/03/21  Yes Swayze, Ava, DO  lansoprazole (PREVACID) 30 MG capsule Take 30 mg by mouth every morning.   Yes [provider]  levothyroxine (SYNTHROID) 75 MCG tablet Take 75 mcg by mouth at bedtime.   Yes [provider]  losartan-hydrochlorothiazide (HYZAAR) 100-25 MG tablet Take 1 tablet by mouth every morning.   Yes [provider]  Melatonin 5 MG SUBL Place 5 mg under the tongue at bedtime.   Yes [provider]  Menthol, Topical Analgesic, 5 % GEL Apply 1 application  topically See admin instructions. Apply to the neck every  morning   Yes [provider]  metoprolol succinate (TOPROL-XL) 50 MG 24 hr tablet Take 50 mg by mouth daily. Take with or immediately following a meal.   Yes [provider]  Omega-3 Fatty Acids (FISH OIL) 1000 MG CAPS Take 1,000 mg by mouth 3 (three) times daily.   Yes [provider]  OXYGEN Inhale 2 L/min into the lungs as needed (for shortness of breath).   Yes [provider]  Propyl Glycol-Hydroxyethylcell (NASAL MOIST) GEL Place 1 application  into both nostrils daily.   Yes [provider]  saccharomyces boulardii (FLORASTOR) 250 MG capsule Take 250 mg by mouth 2 (two) times daily. 08/26/22 09/06/22 Yes [provider]  Skin Protectants, Misc. (EUCERIN) cream Apply 1 Application topically See admin instructions. Apply to dry skin daily and as needed   Yes [provider]  SYMBICORT 80-4.5 MCG/ACT inhaler Inhale 2 puffs into the lungs 2 (two) times daily.   Yes [provider]  Tiotropium Bromide Monohydrate (SPIRIVA RESPIMAT) 2.5 MCG/ACT AERS Inhale 2.5 mcg into the lungs in the morning.   Yes [provider]  gabapentin (NEURONTIN) 300 MG capsule Take 1 capsule (300 mg total) by mouth 3 (three) times daily. Patient not taking: Reported on 08/29/2022 11/11/19 08/29/22  Grayce Sessions, NP    Physical Exam: Vitals:   08/29/22 2315 08/30/22 0000 08/30/22 0045 08/30/22 0115  BP: 117/70 129/88 98/60 104/64  Pulse:  82 81 82  Resp:  19 17 15   Temp:      TempSrc:      SpO2:  96% 93% 94%    Physical Exam Vitals and nursing note reviewed.  Constitutional:      General: She is not in acute distress.    Appearance: She is obese. She is not toxic-appearing.     Comments: Chronic ill appearing  HENT:     Head: Normocephalic and atraumatic.  Cardiovascular:     Rate and Rhythm: Normal rate and regular rhythm.     Pulses: Normal pulses.  Pulmonary:     Effort: Pulmonary effort is normal. No respiratory  distress.     Comments: Scattered rhonchi Abdominal:     General: There is no distension.     Tenderness: There is no abdominal tenderness.  Skin:    General: Skin is warm and dry.     Capillary Refill: Capillary refill takes less than 2 seconds.  Neurological:  Mental Status: She is alert and oriented to person, place, and time.     Comments: Dense left hemiparesis      Labs on Admission: I have personally reviewed following labs and imaging studies  CBC: Recent Labs  Lab 08/29/22 1835  WBC 12.5*  NEUTROABS 6.8  HGB 13.0  HCT 41.8  MCV 87.4  PLT 255   Basic Metabolic Panel: Recent Labs  Lab 08/29/22 1930  NA 138  K 3.9  CL 102  CO2 27  GLUCOSE 266*  BUN 34*  CREATININE 1.75*  CALCIUM 8.8*   GFR: CrCl cannot be calculated (Unknown ideal weight.). Liver Function Tests: Recent Labs  Lab 08/29/22 1930  AST 24  ALT 16  ALKPHOS 57  BILITOT 0.5  PROT 7.3  ALBUMIN 3.3*   Recent Labs  Lab 08/29/22 1930  LIPASE 42   No results for input(s): "AMMONIA" in the last 168 hours. Coagulation Profile: No results for input(s): "INR", "PROTIME" in the last 168 hours. Cardiac Enzymes: Recent Labs  Lab 08/29/22 1835 08/29/22 1930 08/29/22 2032  CKTOTAL  --  27*  --   TROPONINIHS 4  --  3   BNP (last 3 results) No results for input(s): "PROBNP" in the last 8760 hours. HbA1C: No results for input(s): "HGBA1C" in the last 72 hours. CBG: Recent Labs  Lab 08/29/22 1941  GLUCAP 263*   Lipid Profile: No results for input(s): "CHOL", "HDL", "LDLCALC", "TRIG", "CHOLHDL", "LDLDIRECT" in the last 72 hours. Thyroid Function Tests: No results for input(s): "TSH", "T4TOTAL", "FREET4", "T3FREE", "THYROIDAB" in the last 72 hours. Anemia Panel: No results for input(s): "VITAMINB12", "FOLATE", "FERRITIN", "TIBC", "IRON", "RETICCTPCT" in the last 72 hours. Urine analysis:    Component Value Date/Time   COLORURINE YELLOW 08/30/2022 0024   APPEARANCEUR CLEAR  08/30/2022 0024   APPEARANCEUR Clear 11/24/2012 1238   LABSPEC 1.017 08/30/2022 0024   LABSPEC 1.000 11/24/2012 1238   PHURINE 5.0 08/30/2022 0024   GLUCOSEU NEGATIVE 08/30/2022 0024   GLUCOSEU >=500 11/24/2012 1238   HGBUR NEGATIVE 08/30/2022 0024   BILIRUBINUR NEGATIVE 08/30/2022 0024   BILIRUBINUR Negative 11/24/2012 1238   KETONESUR NEGATIVE 08/30/2022 0024   PROTEINUR NEGATIVE 08/30/2022 0024   UROBILINOGEN 1.0 04/30/2010 0902   NITRITE NEGATIVE 08/30/2022 0024   LEUKOCYTESUR TRACE (A) 08/30/2022 0024   LEUKOCYTESUR Negative 11/24/2012 1238    Radiological Exams on Admission: I have personally reviewed images DG Chest Portable 1 View  Result Date: 08/29/2022 CLINICAL DATA:  Cough, hypoxia EXAM: PORTABLE CHEST 1 VIEW COMPARISON:  01/01/2022 FINDINGS: Heart and mediastinal contours are within normal limits. No focal opacities or effusions. No acute bony abnormality. IMPRESSION: No active disease. Electronically Signed   By: Charlett Nose M.D.   On: 08/29/2022 19:18    EKG: My personal interpretation of EKG shows: NSR    Assessment/Plan Principal Problem:   AKI (acute kidney injury) (HCC) Active Problems:   Acute renal failure superimposed on stage 3a chronic kidney disease (HCC)   Chronic respiratory failure with hypoxia (HCC)   Type 2 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 disease, with long-term current use of insulin (HCC)   Hypothyroidism   Essential hypertension   COPD with chronic bronchitis (HCC)   Overweight (BMI 25.0-29.9)   History of pulmonary embolism    Assessment and Plan: * AKI (acute kidney injury) (HCC) Observation MedSurg bed.  Unclear the cause of the patient's acute kidney injury.  Patient is on HCTZ and losartan at the nursing home.  Will  hold this.  Hydrated gently.  Her UA is not consistent with an acute urinary tract infection however she has been treated for the last 4 days with Omnicef.  Will continue with 2 more days of IV Rocephin to  complete her course of antibiotics.  Hypothyroidism Stable.  Continue Synthroid 75 mcg daily.  Type 2 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 disease, with long-term current use of insulin ([place]) Continue with sliding scale insulin and Lantus.  Reduce her dose to 20 units twice a day while she recovers from her acute kidney injury.  Chronic respiratory failure with hypoxia (HCC) Patient's on as needed supplemental oxygen according to nursing home records.  Acute renal failure superimposed on stage 3a chronic kidney disease ([place]) See above  History of pulmonary embolism Stable.  Continue Eliquis 5 mg twice daily.  Overweight (BMI 25.0-29.9) Chronic.  COPD with chronic bronchitis (HCC) Chronic.  Continue inhalers.  As needed DuoNebs.  Essential hypertension Stable.  Hold Hyzaar 100/25.  Continue Toprol-XL 50 mg daily.   DVT prophylaxis: Eliquis Code Status: Full Code Family Communication: discussed with pt and her mother at bedside  Disposition Plan: return home  Consults called: none  Admission status: Observation, Med-Surg   Kristopher Oppenheim, DO [facility] 08/30/2022, 1:53 AM

## 2022-08-30 NOTE — Assessment & Plan Note (Signed)
Stable.  Continue Synthroid 75 mcg daily. 

## 2022-08-30 NOTE — Assessment & Plan Note (Addendum)
Chronic.  Continue inhalers.

## 2022-08-30 NOTE — Assessment & Plan Note (Signed)
See above

## 2022-08-30 NOTE — Progress Notes (Signed)
Report given to [place] at [facility]. Patient waiting for [facility] at this time.

## 2022-09-02 ENCOUNTER — Other Ambulatory Visit: Payer: Medicaid Other

## 2022-09-02 LAB — URINE CULTURE: Culture: 50000 — AB

## 2022-09-03 LAB — CULTURE, BLOOD (ROUTINE X 2)
Culture: NO GROWTH
Culture: NO GROWTH
Special Requests: ADEQUATE
Special Requests: ADEQUATE

## 2022-09-06 ENCOUNTER — Emergency Department (HOSPITAL_COMMUNITY): Payer: Medicaid Other

## 2022-09-06 ENCOUNTER — Other Ambulatory Visit: Payer: Self-pay

## 2022-09-06 ENCOUNTER — Encounter (HOSPITAL_COMMUNITY): Payer: Self-pay

## 2022-09-06 ENCOUNTER — Inpatient Hospital Stay (HOSPITAL_COMMUNITY)
Admission: EM | Admit: 2022-09-06 | Discharge: 2022-09-10 | DRG: 872 | Disposition: A | Discharge status: Skilled Nursing Facility | Payer: Medicaid Other | Source: Skilled Nursing Facility | Attending: Internal Medicine | Admitting: Internal Medicine

## 2022-09-06 DIAGNOSIS — Z91018 Allergy to other foods: Secondary | ICD-10-CM

## 2022-09-06 DIAGNOSIS — J9611 Chronic respiratory failure with hypoxia: Secondary | ICD-10-CM | POA: Diagnosis present

## 2022-09-06 DIAGNOSIS — E1142 Type 2 diabetes mellitus with diabetic polyneuropathy: Secondary | ICD-10-CM | POA: Diagnosis present

## 2022-09-06 DIAGNOSIS — Z8673 Personal history of transient ischemic attack (TIA), and cerebral infarction without residual deficits: Secondary | ICD-10-CM

## 2022-09-06 DIAGNOSIS — Z888 Allergy status to other drugs, medicaments and biological substances status: Secondary | ICD-10-CM

## 2022-09-06 DIAGNOSIS — J449 Chronic obstructive pulmonary disease, unspecified: Secondary | ICD-10-CM | POA: Insufficient documentation

## 2022-09-06 DIAGNOSIS — K219 Gastro-esophageal reflux disease without esophagitis: Secondary | ICD-10-CM | POA: Diagnosis present

## 2022-09-06 DIAGNOSIS — K5909 Other constipation: Secondary | ICD-10-CM | POA: Diagnosis present

## 2022-09-06 DIAGNOSIS — N1831 Chronic kidney disease, stage 3a: Secondary | ICD-10-CM | POA: Diagnosis present

## 2022-09-06 DIAGNOSIS — E039 Hypothyroidism, unspecified: Secondary | ICD-10-CM | POA: Diagnosis present

## 2022-09-06 DIAGNOSIS — E119 Type 2 diabetes mellitus without complications: Secondary | ICD-10-CM

## 2022-09-06 DIAGNOSIS — F32A Depression, unspecified: Secondary | ICD-10-CM | POA: Diagnosis present

## 2022-09-06 DIAGNOSIS — Z825 Family history of asthma and other chronic lower respiratory diseases: Secondary | ICD-10-CM

## 2022-09-06 DIAGNOSIS — N390 Urinary tract infection, site not specified: Secondary | ICD-10-CM | POA: Diagnosis present

## 2022-09-06 DIAGNOSIS — E1122 Type 2 diabetes mellitus with diabetic chronic kidney disease: Secondary | ICD-10-CM | POA: Diagnosis present

## 2022-09-06 DIAGNOSIS — Z7985 Long-term (current) use of injectable non-insulin antidiabetic drugs: Secondary | ICD-10-CM

## 2022-09-06 DIAGNOSIS — Z1152 Encounter for screening for COVID-19: Secondary | ICD-10-CM

## 2022-09-06 DIAGNOSIS — I129 Hypertensive chronic kidney disease with stage 1 through stage 4 chronic kidney disease, or unspecified chronic kidney disease: Secondary | ICD-10-CM | POA: Diagnosis present

## 2022-09-06 DIAGNOSIS — N179 Acute kidney failure, unspecified: Secondary | ICD-10-CM | POA: Diagnosis present

## 2022-09-06 DIAGNOSIS — J4489 Other specified chronic obstructive pulmonary disease: Secondary | ICD-10-CM | POA: Diagnosis present

## 2022-09-06 DIAGNOSIS — Z87891 Personal history of nicotine dependence: Secondary | ICD-10-CM

## 2022-09-06 DIAGNOSIS — Z7982 Long term (current) use of aspirin: Secondary | ICD-10-CM

## 2022-09-06 DIAGNOSIS — Z86718 Personal history of other venous thrombosis and embolism: Secondary | ICD-10-CM

## 2022-09-06 DIAGNOSIS — Z79890 Hormone replacement therapy (postmenopausal): Secondary | ICD-10-CM

## 2022-09-06 DIAGNOSIS — E871 Hypo-osmolality and hyponatremia: Secondary | ICD-10-CM | POA: Diagnosis present

## 2022-09-06 DIAGNOSIS — Z8249 Family history of ischemic heart disease and other diseases of the circulatory system: Secondary | ICD-10-CM

## 2022-09-06 DIAGNOSIS — E878 Other disorders of electrolyte and fluid balance, not elsewhere classified: Secondary | ICD-10-CM | POA: Diagnosis present

## 2022-09-06 DIAGNOSIS — E782 Mixed hyperlipidemia: Secondary | ICD-10-CM | POA: Diagnosis present

## 2022-09-06 DIAGNOSIS — Z86711 Personal history of pulmonary embolism: Secondary | ICD-10-CM | POA: Diagnosis present

## 2022-09-06 DIAGNOSIS — A419 Sepsis, unspecified organism: Principal | ICD-10-CM | POA: Diagnosis present

## 2022-09-06 DIAGNOSIS — E1169 Type 2 diabetes mellitus with other specified complication: Secondary | ICD-10-CM | POA: Diagnosis present

## 2022-09-06 DIAGNOSIS — E441 Mild protein-calorie malnutrition: Secondary | ICD-10-CM | POA: Diagnosis present

## 2022-09-06 DIAGNOSIS — I10 Essential (primary) hypertension: Secondary | ICD-10-CM | POA: Diagnosis present

## 2022-09-06 DIAGNOSIS — I69354 Hemiplegia and hemiparesis following cerebral infarction affecting left non-dominant side: Secondary | ICD-10-CM

## 2022-09-06 DIAGNOSIS — Z79899 Other long term (current) drug therapy: Secondary | ICD-10-CM

## 2022-09-06 DIAGNOSIS — R197 Diarrhea, unspecified: Secondary | ICD-10-CM | POA: Diagnosis present

## 2022-09-06 DIAGNOSIS — N12 Tubulo-interstitial nephritis, not specified as acute or chronic: Secondary | ICD-10-CM

## 2022-09-06 DIAGNOSIS — Z1612 Extended spectrum beta lactamase (ESBL) resistance: Secondary | ICD-10-CM | POA: Diagnosis present

## 2022-09-06 DIAGNOSIS — Z720 Tobacco use: Secondary | ICD-10-CM | POA: Diagnosis present

## 2022-09-06 DIAGNOSIS — Z794 Long term (current) use of insulin: Secondary | ICD-10-CM

## 2022-09-06 DIAGNOSIS — Z7901 Long term (current) use of anticoagulants: Secondary | ICD-10-CM

## 2022-09-06 DIAGNOSIS — Z833 Family history of diabetes mellitus: Secondary | ICD-10-CM

## 2022-09-06 LAB — CBC WITH DIFFERENTIAL/PLATELET
Abs Immature Granulocytes: 0.09 10*3/uL — ABNORMAL HIGH (ref 0.00–0.07)
Basophils Absolute: 0.1 10*3/uL (ref 0.0–0.1)
Basophils Relative: 0 %
Eosinophils Absolute: 0.1 10*3/uL (ref 0.0–0.5)
Eosinophils Relative: 0 %
HCT: 42.1 % (ref 36.0–46.0)
Hemoglobin: 13.1 g/dL (ref 12.0–15.0)
Immature Granulocytes: 1 %
Lymphocytes Relative: 25 %
Lymphs Abs: 3.7 10*3/uL (ref 0.7–4.0)
MCH: 27.3 pg (ref 26.0–34.0)
MCHC: 31.1 g/dL (ref 30.0–36.0)
MCV: 87.7 fL (ref 80.0–100.0)
Monocytes Absolute: 1.3 10*3/uL — ABNORMAL HIGH (ref 0.1–1.0)
Monocytes Relative: 9 %
Neutro Abs: 9.8 10*3/uL — ABNORMAL HIGH (ref 1.7–7.7)
Neutrophils Relative %: 65 %
Platelets: 294 10*3/uL (ref 150–400)
RBC: 4.8 MIL/uL (ref 3.87–5.11)
RDW: 13 % (ref 11.5–15.5)
WBC: 15 10*3/uL — ABNORMAL HIGH (ref 4.0–10.5)
nRBC: 0 % (ref 0.0–0.2)

## 2022-09-06 LAB — BASIC METABOLIC PANEL
Anion gap: 11 (ref 5–15)
BUN: 22 mg/dL — ABNORMAL HIGH (ref 6–20)
CO2: 26 mmol/L (ref 22–32)
Calcium: 9.3 mg/dL (ref 8.9–10.3)
Chloride: 91 mmol/L — ABNORMAL LOW (ref 98–111)
Creatinine, Ser: 1.62 mg/dL — ABNORMAL HIGH (ref 0.44–1.00)
GFR, Estimated: 38 mL/min — ABNORMAL LOW (ref >60)
Glucose, Bld: 229 mg/dL — ABNORMAL HIGH (ref 70–99)
Potassium: 4.9 mmol/L (ref 3.5–5.1)
Sodium: 128 mmol/L — ABNORMAL LOW (ref 135–145)

## 2022-09-06 LAB — URINALYSIS, ROUTINE W REFLEX MICROSCOPIC
Bilirubin Urine: NEGATIVE
Glucose, UA: NEGATIVE mg/dL
Ketones, ur: NEGATIVE mg/dL
Nitrite: NEGATIVE
Protein, ur: 30 mg/dL — AB
RBC / HPF: >50 RBC/hpf — ABNORMAL HIGH (ref 0–5)
Specific Gravity, Urine: 1.02 (ref 1.005–1.030)
pH: 5.5 (ref 5.0–8.0)

## 2022-09-06 LAB — TROPONIN I (HIGH SENSITIVITY): Troponin I (High Sensitivity): 6 ng/L (ref <18)

## 2022-09-06 LAB — RESP PANEL BY RT-PCR (FLU A&B, COVID) ARPGX2
Influenza A by PCR: NEGATIVE
Influenza B by PCR: NEGATIVE
SARS Coronavirus 2 by RT PCR: NEGATIVE

## 2022-09-06 LAB — D-DIMER, QUANTITATIVE: D-Dimer, Quant: 0.39 ug/mL-FEU (ref 0.00–0.50)

## 2022-09-06 MED ORDER — SODIUM CHLORIDE 0.9 % IV BOLUS
1000.0000 mL | Freq: Once | INTRAVENOUS | Status: AC
  Administered 2022-09-07: 1000 mL via INTRAVENOUS

## 2022-09-06 MED ORDER — SODIUM CHLORIDE 0.9 % IV SOLN: 2.0000 g | Freq: Once | INTRAVENOUS | Status: DC

## 2022-09-06 MED ORDER — CIPROFLOXACIN IN D5W 400 MG/200ML IV SOLN
400.0000 mg | Freq: Once | INTRAVENOUS | Status: AC
  Administered 2022-09-07: 400 mg via INTRAVENOUS
  Filled 2022-09-06: qty 200

## 2022-09-06 NOTE — ED Notes (Signed)
Pts SPO2  was 87%. Pt does have COPD and was not in distress. However, I notified the nurse and we put the pt on 2 L/min. Pt was roomed shortly after.

## 2022-09-06 NOTE — ED Triage Notes (Addendum)
Per EMS- patient is from [place]. Patient has had dysuria, flank pain, and urinary frequency x 4 days, fever x 1 week, cough and headache x 2 days.  Room air sats 89%. Patient placed on O2 2L/min vis [place] and sats increased to 96%.  Patient received Tylenol 1000 mg at 1600 today.   Patient normally wears O2 at night only. Patient states she has been on antibiotics for a UTI since Thanksgiving.

## 2022-09-06 NOTE — ED Provider Triage Note (Addendum)
Emergency Medicine Provider Triage Evaluation Note  Karla Hoover , a 51 y.o. female  was evaluated in triage.  Patient presenting with multiple complaints.  Reports that she has had multiple days of a headache, subjective fever, cough and right rib pain.  Reports a history of COPD and wears oxygen only at night and has required it during the day.  History of PE and reports missing 2 doses of her blood thinner.  Endorsing left lower extremity swelling.  Reports being here with an AKI and UTI recently.  She finished her antibiotics but says that occasionally she still has urinary frequency.  Review of Systems  Positive:  Negative:   Physical Exam  BP 120/86 (BP Location: Right Arm)   Pulse (!) 116   Temp 98.7 F (37.1 C) (Oral)   Resp 18   Ht 5\' 6"  (1.676 m)   Wt 86.6 kg   LMP 10/20/2019   SpO2 95%   BMI 30.83 kg/m  Gen:   Awake, no distress   Resp:  Normal effort  MSK:   Moves extremities without difficulty  Other:  Tachycardic, I turned off patient's oxygen and her saturations remained around 93%.  Medical Decision Making  Medically screening exam initiated at 7:22 PM.  Appropriate orders placed.  Arilla C Cutshaw was informed that the remainder of the evaluation will be completed by another provider, this initial triage assessment does not replace that evaluation, and the importance of remaining in the ED until their evaluation is complete.       Joanie Coddington 09/06/22 1924   I was notified by nursing staff that patient is now satting in the upper 80s.  May be somewhat baseline due to COPD but patient does not usually require oxygen unless she is asleep.  Her temperature is also increased to 99.7 and her tachycardia has increased as well.  Will add lactic and blood cultures.  Patient has hematuria, decreased kidney function and hyponatremia.  Likely will require further imaging.  Nursing staff made aware the patient needs the next room.   14/01/23, PA-C 09/06/22 2309

## 2022-09-06 NOTE — ED Provider Notes (Signed)
[facility]-EMERGENCY DEPT [facility] Emergency Department Provider Note MRN:  [id number]  Arrival date & time: 09/07/22     Chief Complaint   Dysuria, Flank Pain, Fever, Cough, and Headache   History of Present Illness   Karla Hoover is a 51 y.o. year-old female with a history of COPD, diabetes presenting to the ED with chief complaint of dysuria.  Dysuria for the past week, diagnosed with UTI.  Worsening despite antibiotics.  Thinks maybe the antibiotic is Keflex but not sure.  Has been having right flank pain for the past 4 days.  Fever, headache, increased oxygen requirement at home as well.  No cough.  Review of Systems  A thorough review of systems was obtained and all systems are negative except as noted in the HPI and PMH.   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 No pertinent surgical history.  Family History  Problem Relation Age of Onset   Diabetes Mother    COPD Sister    Heart failure Sister     Social History   Socioeconomic History   Marital status: Single    Spouse name: Not  on file   Number of children: Not on file   Years of education: Not on file   Highest education level: Not on file  Occupational History   Not on file  Tobacco Use   Smoking status: Former    Packs/day: 0.50    Years: 36.00    Total pack years: 18.00    Types: Cigarettes    Start date: 10/08/1983    Quit date: 11/22/2019    Years since quitting: 2.7   Smokeless tobacco: Never  Vaping Use   Vaping Use: Never used  Substance and Sexual Activity   Alcohol use: No   Drug use: No   Sexual activity: Not on file  Other Topics Concern   Not on file  Social History Narrative   Not on file   Social Determinants of Health   Financial Resource Strain: Not on file  Food Insecurity: No Food Insecurity (08/30/2022)   Hunger Vital Sign    Worried About Running Out of Food in the Last Year: Never true    Ran Out of Food in the Last Year: Never true  Transportation Needs: No Transportation Needs (08/30/2022)   PRAPARE - Administrator, Civil Service (Medical): No    Lack of Transportation (Non-Medical): No  Physical Activity: Not on file  Stress: Not on file  Social Connections: Not on file  Intimate Partner Violence: Not At Risk (08/30/2022)   Humiliation, Afraid,  Rape, and Kick questionnaire    Fear of Current or Ex-Partner: No    Emotionally Abused: No    Physically Abused: No    Sexually Abused: No     Physical Exam   Vitals:   09/07/22 0145 09/07/22 0155  BP: (!) 145/103   Pulse: (!) 118   Resp:    Temp:  (!) 102.9 F (39.4 C)  SpO2: 100%     CONSTITUTIONAL: Well-appearing, NAD NEURO/PSYCH:  Alert and oriented x 3, no focal deficits EYES:  eyes equal and reactive ENT/NECK:  no LAD, no JVD CARDIO: Tachycardic rate, well-perfused, normal S1 and S2 PULM:  CTAB no wheezing or rhonchi GI/GU:  non-distended, non-tender MSK/SPINE:  No gross deformities, no edema SKIN:  no rash, atraumatic   *Additional and/or pertinent findings included in MDM  below  Diagnostic and Interventional Summary    EKG Interpretation  Date/Time:    Ventricular Rate:    PR Interval:    QRS Duration:   QT Interval:    QTC Calculation:   R Axis:     Text Interpretation:         Labs Reviewed  CBC WITH DIFFERENTIAL/PLATELET - Abnormal; Notable for the following components:      Result Value   WBC 15.0 (*)    Neutro Abs 9.8 (*)    Monocytes Absolute 1.3 (*)    Abs Immature Granulocytes 0.09 (*)    All other components within normal limits  BASIC METABOLIC PANEL - Abnormal; Notable for the following components:   Sodium 128 (*)    Chloride 91 (*)    Glucose, Bld 229 (*)    BUN 22 (*)    Creatinine, Ser 1.62 (*)    GFR, Estimated 38 (*)    All other components within normal limits  URINALYSIS, ROUTINE W REFLEX MICROSCOPIC - Abnormal; Notable for the following components:   Color, Urine YELLOW (*)    APPearance HAZY (*)    Hgb urine dipstick LARGE (*)    Protein, ur 30 (*)    Leukocytes,Ua SMALL (*)    RBC / HPF >50 (*)    Bacteria, UA RARE (*)    All other components within normal limits  RESP PANEL BY RT-PCR (FLU A&B, COVID) ARPGX2  CULTURE, BLOOD (ROUTINE X 2)  CULTURE, BLOOD (ROUTINE X 2)  URINE CULTURE  D-DIMER, QUANTITATIVE  LACTIC ACID, PLASMA  LACTIC ACID, PLASMA  TROPONIN I (HIGH SENSITIVITY)  TROPONIN I (HIGH SENSITIVITY)    CT Renal Stone Study  Final Result    DG Chest 2 View  Final Result      Medications  sodium chloride 0.9 % bolus 1,000 mL (1,000 mLs Intravenous New Bag/Given 09/07/22 0050)  ciprofloxacin (CIPRO) IVPB 400 mg (400 mg Intravenous New Bag/Given 09/07/22 0053)     Procedures  /  Critical Care .Critical Care  Performed by: Sabas Sous, MD Authorized by: Sabas Sous, MD   Critical care provider statement:    Critical care time (minutes):  45   Critical care was necessary to treat or prevent imminent or life-threatening deterioration of the following conditions:  Sepsis   Critical  care was time spent personally by me on the following activities:  Development of treatment plan with patient or surrogate, discussions with consultants, evaluation of patient's response to treatment, examination of patient, ordering and review of laboratory studies, ordering and review of radiographic studies, ordering and performing treatments and interventions, pulse oximetry, re-evaluation of patient's condition and  review of old charts   ED Course and Medical Decision Making  Initial Impression and Ddx Suspect UTI that has failed outpatient management and has progressed to pyelonephritis.  Patient arrives tachycardic, oral temp 99.7, leukocytosis of 15 and so code sepsis initiated.  Providing fluids, antibiotics.  Given the suspected failure of Keflex will provide ciprofloxacin.  Past medical/surgical history that increases complexity of ED encounter: Diabetes, COPD  Interpretation of Diagnostics I personally reviewed the Chest Xray and my interpretation is as follows: No obvious lobar opacity or pneumothorax  Labs reveal leukocytosis, mild hyponatremia, AKI  Patient Reassessment and Ultimate Disposition/Management     On reassessment patient continues to be febrile and tachycardic despite fluids, antibiotics, will request hospitalist admission.  Patient management required discussion with the following services or consulting groups:  Hospitalist Service  Complexity of Problems Addressed Acute illness or injury that poses threat of life of bodily function  Additional Data Reviewed and Analyzed Further history obtained from: Further history from spouse/family member  Additional Factors Impacting ED Encounter Risk Consideration of hospitalization  Elmer Sow. Pilar Plate, MD [facility] Emergency Medicine [facility] [email or web address]  Final Clinical Impressions(s) / ED Diagnoses     ICD-10-CM   1. Pyelonephritis  N12     2. Sepsis, due to unspecified organism,  unspecified whether acute organ dysfunction present [facility])  A41.9       ED Discharge Orders     None        Discharge Instructions Discussed with and Provided to Patient:   Discharge Instructions   None      Sabas Sous, MD 09/07/22 [id number]

## 2022-09-07 ENCOUNTER — Emergency Department (HOSPITAL_COMMUNITY): Payer: Medicaid Other

## 2022-09-07 DIAGNOSIS — E441 Mild protein-calorie malnutrition: Secondary | ICD-10-CM | POA: Diagnosis present

## 2022-09-07 DIAGNOSIS — K219 Gastro-esophageal reflux disease without esophagitis: Secondary | ICD-10-CM | POA: Diagnosis present

## 2022-09-07 DIAGNOSIS — N1831 Chronic kidney disease, stage 3a: Secondary | ICD-10-CM

## 2022-09-07 DIAGNOSIS — E878 Other disorders of electrolyte and fluid balance, not elsewhere classified: Secondary | ICD-10-CM | POA: Diagnosis present

## 2022-09-07 DIAGNOSIS — A419 Sepsis, unspecified organism: Secondary | ICD-10-CM | POA: Diagnosis present

## 2022-09-07 DIAGNOSIS — F32A Depression, unspecified: Secondary | ICD-10-CM | POA: Diagnosis present

## 2022-09-07 DIAGNOSIS — J9611 Chronic respiratory failure with hypoxia: Secondary | ICD-10-CM | POA: Diagnosis present

## 2022-09-07 DIAGNOSIS — E1169 Type 2 diabetes mellitus with other specified complication: Secondary | ICD-10-CM | POA: Diagnosis present

## 2022-09-07 DIAGNOSIS — Z87891 Personal history of nicotine dependence: Secondary | ICD-10-CM | POA: Diagnosis not present

## 2022-09-07 DIAGNOSIS — Z8673 Personal history of transient ischemic attack (TIA), and cerebral infarction without residual deficits: Secondary | ICD-10-CM

## 2022-09-07 DIAGNOSIS — J4489 Other specified chronic obstructive pulmonary disease: Secondary | ICD-10-CM | POA: Diagnosis present

## 2022-09-07 DIAGNOSIS — I69354 Hemiplegia and hemiparesis following cerebral infarction affecting left non-dominant side: Secondary | ICD-10-CM | POA: Diagnosis not present

## 2022-09-07 DIAGNOSIS — N390 Urinary tract infection, site not specified: Secondary | ICD-10-CM | POA: Diagnosis present

## 2022-09-07 DIAGNOSIS — J449 Chronic obstructive pulmonary disease, unspecified: Secondary | ICD-10-CM | POA: Insufficient documentation

## 2022-09-07 DIAGNOSIS — E871 Hypo-osmolality and hyponatremia: Secondary | ICD-10-CM | POA: Diagnosis present

## 2022-09-07 DIAGNOSIS — N179 Acute kidney failure, unspecified: Secondary | ICD-10-CM

## 2022-09-07 DIAGNOSIS — K5909 Other constipation: Secondary | ICD-10-CM | POA: Diagnosis present

## 2022-09-07 DIAGNOSIS — E039 Hypothyroidism, unspecified: Secondary | ICD-10-CM | POA: Diagnosis present

## 2022-09-07 DIAGNOSIS — Z1152 Encounter for screening for COVID-19: Secondary | ICD-10-CM | POA: Diagnosis not present

## 2022-09-07 DIAGNOSIS — E1142 Type 2 diabetes mellitus with diabetic polyneuropathy: Secondary | ICD-10-CM | POA: Diagnosis present

## 2022-09-07 DIAGNOSIS — Z1612 Extended spectrum beta lactamase (ESBL) resistance: Secondary | ICD-10-CM | POA: Diagnosis present

## 2022-09-07 DIAGNOSIS — Z8249 Family history of ischemic heart disease and other diseases of the circulatory system: Secondary | ICD-10-CM | POA: Diagnosis not present

## 2022-09-07 DIAGNOSIS — E1122 Type 2 diabetes mellitus with diabetic chronic kidney disease: Secondary | ICD-10-CM | POA: Diagnosis present

## 2022-09-07 DIAGNOSIS — I129 Hypertensive chronic kidney disease with stage 1 through stage 4 chronic kidney disease, or unspecified chronic kidney disease: Secondary | ICD-10-CM | POA: Diagnosis present

## 2022-09-07 DIAGNOSIS — I10 Essential (primary) hypertension: Secondary | ICD-10-CM | POA: Diagnosis not present

## 2022-09-07 DIAGNOSIS — Z794 Long term (current) use of insulin: Secondary | ICD-10-CM | POA: Diagnosis not present

## 2022-09-07 DIAGNOSIS — N12 Tubulo-interstitial nephritis, not specified as acute or chronic: Secondary | ICD-10-CM

## 2022-09-07 DIAGNOSIS — E782 Mixed hyperlipidemia: Secondary | ICD-10-CM | POA: Diagnosis present

## 2022-09-07 LAB — GLUCOSE, CAPILLARY
Glucose-Capillary: 316 mg/dL — ABNORMAL HIGH (ref 70–99)
Glucose-Capillary: 190 mg/dL — ABNORMAL HIGH (ref 70–99)

## 2022-09-07 LAB — TROPONIN I (HIGH SENSITIVITY): Troponin I (High Sensitivity): 5 ng/L (ref <18)

## 2022-09-07 LAB — LACTIC ACID, PLASMA: Lactic Acid, Venous: 1.5 mmol/L (ref 0.5–1.9)

## 2022-09-07 LAB — CBG MONITORING, ED: Glucose-Capillary: 279 mg/dL — ABNORMAL HIGH (ref 70–99)

## 2022-09-07 MED ORDER — ACETAMINOPHEN 650 MG RE SUPP: 650.0000 mg | Freq: Four times a day (QID) | RECTAL | Status: DC | PRN

## 2022-09-07 MED ORDER — ONDANSETRON HCL 4 MG/2ML IJ SOLN
4.0000 mg | Freq: Four times a day (QID) | INTRAMUSCULAR | Status: DC | PRN
  Administered 2022-09-07: 4 mg via INTRAVENOUS
  Filled 2022-09-07: qty 2

## 2022-09-07 MED ORDER — ONDANSETRON HCL 4 MG PO TABS: 4.0000 mg | ORAL_TABLET | Freq: Four times a day (QID) | ORAL | Status: DC | PRN

## 2022-09-07 MED ORDER — LOSARTAN POTASSIUM-HCTZ 100-25 MG PO TABS: 1.0000 | ORAL_TABLET | Freq: Every morning | ORAL | Status: DC

## 2022-09-07 MED ORDER — ONDANSETRON HCL 4 MG/2ML IJ SOLN: 4.0000 mg | Freq: Four times a day (QID) | INTRAMUSCULAR | Status: DC | PRN

## 2022-09-07 MED ORDER — ATORVASTATIN CALCIUM 40 MG PO TABS
80.0000 mg | ORAL_TABLET | Freq: Every day | ORAL | Status: DC
  Administered 2022-09-07 – 2022-09-09 (×3): 80 mg via ORAL
  Filled 2022-09-07 (×3): qty 2

## 2022-09-07 MED ORDER — LEVOTHYROXINE SODIUM 50 MCG PO TABS
75.0000 ug | ORAL_TABLET | Freq: Every day | ORAL | Status: DC
  Administered 2022-09-07 – 2022-09-10 (×4): 75 ug via ORAL
  Filled 2022-09-07 (×4): qty 1

## 2022-09-07 MED ORDER — ACETAMINOPHEN 325 MG PO TABS
650.0000 mg | ORAL_TABLET | Freq: Four times a day (QID) | ORAL | Status: DC | PRN
  Administered 2022-09-07 – 2022-09-09 (×3): 650 mg via ORAL
  Filled 2022-09-07 (×3): qty 2

## 2022-09-07 MED ORDER — SODIUM CHLORIDE 0.9 % IV SOLN
1.0000 g | Freq: Two times a day (BID) | INTRAVENOUS | Status: DC
  Administered 2022-09-07 – 2022-09-08 (×3): 1 g via INTRAVENOUS
  Filled 2022-09-07 (×6): qty 20

## 2022-09-07 MED ORDER — HYDROMORPHONE HCL 1 MG/ML IJ SOLN
0.5000 mg | INTRAMUSCULAR | Status: DC | PRN
  Administered 2022-09-07 – 2022-09-10 (×8): 0.5 mg via INTRAVENOUS
  Filled 2022-09-07 (×8): qty 0.5

## 2022-09-07 MED ORDER — EZETIMIBE 10 MG PO TABS
10.0000 mg | ORAL_TABLET | Freq: Every day | ORAL | Status: DC
  Administered 2022-09-07 – 2022-09-09 (×3): 10 mg via ORAL
  Filled 2022-09-07 (×3): qty 1

## 2022-09-07 MED ORDER — DULAGLUTIDE 0.75 MG/0.5ML ~~LOC~~ SOAJ: 0.7500 mg | SUBCUTANEOUS | Status: DC

## 2022-09-07 MED ORDER — INSULIN GLARGINE-YFGN 100 UNIT/ML ~~LOC~~ SOLN
40.0000 [IU] | Freq: Two times a day (BID) | SUBCUTANEOUS | Status: DC
  Administered 2022-09-07 – 2022-09-10 (×6): 40 [IU] via SUBCUTANEOUS
  Filled 2022-09-07 (×9): qty 0.4

## 2022-09-07 MED ORDER — PANTOPRAZOLE SODIUM 40 MG PO TBEC
40.0000 mg | DELAYED_RELEASE_TABLET | Freq: Every day | ORAL | Status: DC
  Administered 2022-09-07 – 2022-09-10 (×4): 40 mg via ORAL
  Filled 2022-09-07 (×3): qty 1

## 2022-09-07 MED ORDER — TIOTROPIUM BROMIDE MONOHYDRATE 2.5 MCG/ACT IN AERS: 2.5000 ug | INHALATION_SPRAY | Freq: Every morning | RESPIRATORY_TRACT | Status: DC

## 2022-09-07 MED ORDER — ASPIRIN 81 MG PO CHEW
81.0000 mg | CHEWABLE_TABLET | Freq: Every day | ORAL | Status: DC
  Administered 2022-09-07 – 2022-09-10 (×4): 81 mg via ORAL
  Filled 2022-09-07 (×4): qty 1

## 2022-09-07 MED ORDER — APIXABAN 5 MG PO TABS
5.0000 mg | ORAL_TABLET | Freq: Two times a day (BID) | ORAL | Status: DC
  Administered 2022-09-07 – 2022-09-10 (×7): 5 mg via ORAL
  Filled 2022-09-07 (×7): qty 1

## 2022-09-07 MED ORDER — GABAPENTIN 400 MG PO CAPS
400.0000 mg | ORAL_CAPSULE | Freq: Three times a day (TID) | ORAL | Status: DC
  Administered 2022-09-07 – 2022-09-10 (×9): 400 mg via ORAL
  Filled 2022-09-07 (×9): qty 1

## 2022-09-07 MED ORDER — HYDRALAZINE HCL 20 MG/ML IJ SOLN
10.0000 mg | INTRAMUSCULAR | Status: DC | PRN
  Administered 2022-09-07: 10 mg via INTRAVENOUS
  Filled 2022-09-07: qty 1

## 2022-09-07 MED ORDER — MELATONIN 5 MG PO TABS
5.0000 mg | ORAL_TABLET | Freq: Every day | ORAL | Status: DC
  Administered 2022-09-07 – 2022-09-09 (×3): 5 mg via ORAL
  Filled 2022-09-07 (×3): qty 1

## 2022-09-07 MED ORDER — METOPROLOL SUCCINATE ER 50 MG PO TB24
50.0000 mg | ORAL_TABLET | Freq: Every day | ORAL | Status: DC
  Administered 2022-09-07 – 2022-09-10 (×4): 50 mg via ORAL
  Filled 2022-09-07 (×4): qty 1

## 2022-09-07 MED ORDER — MOMETASONE FURO-FORMOTEROL FUM 100-5 MCG/ACT IN AERO
2.0000 | INHALATION_SPRAY | Freq: Two times a day (BID) | RESPIRATORY_TRACT | Status: DC
  Administered 2022-09-07 – 2022-09-10 (×6): 2 via RESPIRATORY_TRACT
  Filled 2022-09-07: qty 8.8

## 2022-09-07 MED ORDER — ACETAMINOPHEN 500 MG PO TABS
1000.0000 mg | ORAL_TABLET | Freq: Once | ORAL | Status: AC
  Administered 2022-09-07: 1000 mg via ORAL
  Filled 2022-09-07: qty 2

## 2022-09-07 MED ORDER — CYCLOBENZAPRINE HCL 10 MG PO TABS
10.0000 mg | ORAL_TABLET | Freq: Three times a day (TID) | ORAL | Status: DC | PRN
  Administered 2022-09-07 – 2022-09-09 (×3): 10 mg via ORAL
  Filled 2022-09-07 (×3): qty 1

## 2022-09-07 MED ORDER — HYDROCHLOROTHIAZIDE 25 MG PO TABS
25.0000 mg | ORAL_TABLET | Freq: Every day | ORAL | Status: DC
  Filled 2022-09-07: qty 1

## 2022-09-07 MED ORDER — SODIUM CHLORIDE 0.9 % IV SOLN: INTRAVENOUS | Status: DC

## 2022-09-07 MED ORDER — SENNOSIDES-DOCUSATE SODIUM 8.6-50 MG PO TABS
1.0000 | ORAL_TABLET | Freq: Every day | ORAL | Status: DC
  Administered 2022-09-07 – 2022-09-09 (×2): 1 via ORAL
  Filled 2022-09-07 (×2): qty 1

## 2022-09-07 MED ORDER — LOSARTAN POTASSIUM 25 MG PO TABS: 100.0000 mg | ORAL_TABLET | Freq: Every day | ORAL | Status: DC

## 2022-09-07 MED ORDER — INSULIN ASPART 100 UNIT/ML IJ SOLN
0.0000 [IU] | Freq: Three times a day (TID) | INTRAMUSCULAR | Status: DC
  Administered 2022-09-07: 7 [IU] via SUBCUTANEOUS
  Administered 2022-09-08 (×2): 2 [IU] via SUBCUTANEOUS
  Administered 2022-09-08: 3 [IU] via SUBCUTANEOUS
  Administered 2022-09-09 (×2): 2 [IU] via SUBCUTANEOUS
  Administered 2022-09-09: 3 [IU] via SUBCUTANEOUS
  Administered 2022-09-10: 1 [IU] via SUBCUTANEOUS
  Filled 2022-09-07: qty 0.09

## 2022-09-07 MED ORDER — BUSPIRONE HCL 5 MG PO TABS
5.0000 mg | ORAL_TABLET | Freq: Two times a day (BID) | ORAL | Status: DC
  Administered 2022-09-07 – 2022-09-10 (×7): 5 mg via ORAL
  Filled 2022-09-07 (×7): qty 1

## 2022-09-07 MED ORDER — INSULIN ASPART 100 UNIT/ML IJ SOLN
0.0000 [IU] | Freq: Every day | INTRAMUSCULAR | Status: DC
  Administered 2022-09-08: 3 [IU] via SUBCUTANEOUS
  Filled 2022-09-07: qty 0.05

## 2022-09-07 MED ORDER — IPRATROPIUM-ALBUTEROL 0.5-2.5 (3) MG/3ML IN SOLN
3.0000 mL | Freq: Three times a day (TID) | RESPIRATORY_TRACT | Status: DC
  Administered 2022-09-07: 3 mL via RESPIRATORY_TRACT
  Filled 2022-09-07 (×2): qty 3

## 2022-09-07 NOTE — Hospital Course (Signed)
51 year old female resident of [facility].  She has a significant past medical history of COPD, nocturnal oxygen use, hypertension, DVT/PE, hypothyroidism, diabetes mellitus, and CVA with left-sided deficits.  She had UTI 2 weeks back, she was treated with antibiotics and completed a course.  Approximately a week ago she found she get up to go to the bathroom on her own she became profoundly weak.  She was coughing very badly.  Both of her sides hurt from coughing.  She reports nausea, anorexia, diarrhea, burning urination.  She reports ongoing burning urination even after completion of her antibiotic course.  She states her urine is foul-smelling.  She denies fever.  She was brought to the ER because of her weakness.   Pt admitted for sepsis with UTI

## 2022-09-07 NOTE — H&P (Addendum)
PCP:   Pcp, No   Chief Complaint: Weakness   HPI: This is a 51 year old female resident of [facility].  She has a significant past medical history of COPD, nocturnal oxygen use, hypertension, DVT/PE, hypothyroidism, diabetes mellitus, and CVA with left-sided deficits.  She had UTI 2 weeks back, she was treated with antibiotics and completed a course.  Approximately a week ago she found she get up to go to the bathroom on her own she became profoundly weak.  She was coughing very badly.  Both of her sides hurt from coughing.  She reports nausea, anorexia, diarrhea, burning urination.  She reports ongoing burning urination even after completion of her antibiotic course.  She states her urine is foul-smelling.  She denies fever.  She was brought to the ER because of her weakness.  Review of Systems:  The patient denies anorexia, fever, weight loss,, vision loss, decreased hearing, hoarseness, chest pain, syncope, dyspnea on exertion, peripheral edema, balance deficits, hemoptysis, abdominal pain, melena, hematochezia, severe indigestion/heartburn, hematuria, incontinence, genital sores, muscle weakness, suspicious skin lesions, transient blindness, difficulty walking, depression, unusual weight change, abnormal bleeding, enlarged lymph nodes, angioedema, and breast masses. Positives: Musculoskeletal discomfort, cough, nausea, vomiting, anorexia, diarrhea, burning urination, weakness  Past Medical History: Past Medical History:  Diagnosis Date   Asthma    Bilateral pulmonary embolism (HCC) 11/15/2019   Chronic kidney disease, stage 3a (HCC) 02/09/2021   Chronic respiratory failure with hypoxia (HCC) 02/09/2021   COPD (chronic obstructive pulmonary disease) (HCC)    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   Former smoker 36 pack year smoking history     Diabetes mellitus without complication (HCC)    Essential hypertension 05/04/2009   Qualifier: Diagnosis of  By: Roxan Hockey CMA,  Jessica     GERD without esophagitis 02/09/2021   Grade I diastolic dysfunction 12/02/2021   Hypertension    Hypothyroidism 02/09/2021   Mixed 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 (HCC) 02/09/2021   Pulmonary embolism (HCC) 11/16/2019   Stroke (HCC)    Type 2 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 disease, with long-term current use of insulin (HCC) 02/09/2021   History reviewed. No pertinent surgical history.  Medications: Prior to Admission medications   Medication Sig Start Date End Date Taking? Authorizing Provider  acetaminophen (TYLENOL) 500 MG tablet Take 1,000 mg by mouth in the morning, at noon, and at bedtime.    [provider]  albuterol (VENTOLIN HFA) 108 (90 Base) MCG/ACT inhaler Inhale 2 puffs into the lungs every 6 (six) hours as needed for wheezing or shortness of breath. Patient taking differently: Inhale 2 puffs into the lungs See admin instructions. Inhale 2 puffs into the lungs two times a day and an additional 2 puffs twice a day as needed for shortness of breath or wheezing 10/04/19   Grayce Sessions, NP  apixaban (ELIQUIS) 5 MG TABS tablet Take 2 tablets ( ) twice daily for 7 days, then 1 tablet ( ) twice daily Patient taking differently: Take 5 mg by mouth 2 (two) times daily. 11/21/19   Darlin Drop, DO  aspirin 81 MG chewable tablet Chew 81 mg by mouth in the morning.    [provider]  atorvastatin (LIPITOR) 80 MG tablet Take 1 tablet (80 mg total) by mouth daily. Patient taking differently: Take 80 mg by mouth at bedtime. 10/04/19   Grayce Sessions, NP  B Complex-Biotin-FA TABS Take 1 tablet by mouth in the morning.  [provider]  busPIRone (BUSPAR) 5 MG tablet Take 5 mg by mouth in the morning and at bedtime.    [provider]  chlorhexidine (PERIDEX) 0.12 % solution Use as directed 15 mLs in the mouth or throat every morning. Rinse and spit after oral care. Do not  swallow.    [provider]  Cranberry 250 MG TABS Take 250 mg by mouth at bedtime.    [provider]  cyclobenzaprine (FLEXERIL) 10 MG tablet Take 1 tablet (10 mg total) by mouth 3 (three) times daily as needed for muscle spasms. Patient taking differently: Take 10 mg by mouth every 8 (eight) hours as needed for muscle spasms. 10/04/19   Grayce Sessions, NP  Dulaglutide (TRULICITY) 0.75 MG/0.5ML SOPN Inject 0.75 mg into the skin every Thursday.    [provider]  EPINEPHrine 1 MG/ML SOSY Inject 1 mg into the muscle as needed (for an allergic reaction).    [provider]  ergocalciferol (VITAMIN D2) 1.25 MG (50000 UT) capsule Take 50,000 Units by mouth every Thursday.    [provider]  ezetimibe (ZETIA) 10 MG tablet Take 10 mg by mouth at bedtime.    [provider]  gabapentin (NEURONTIN) 400 MG capsule Take 400 mg by mouth 3 (three) times daily.    [provider]  insulin glargine (LANTUS) 100 UNIT/ML injection Inject 40 Units into the skin 2 (two) times daily.    [provider]  insulin lispro (HUMALOG) 100 UNIT/ML injection Inject 0-14 Units into the skin See admin instructions. Inject 0-14 units twice daily (10am and 6pm) per sliding scale: CBG 0-200=0/no insulin; 201-250=2 units; 251-300=4 units; 301-350=6 units; 351-400=8 units: 401-450= 10 units; 451-500=12 units/ if BS reads high, inject 14 units    [provider]  ipratropium-albuterol (DUONEB) 0.5-2.5 (3) MG/3ML SOLN Inhale 1 vial via nebulizer 3 (three) times daily. Patient taking differently: Take 3 mLs by nebulization 3 (three) times daily as needed (for shortness of breath or wheezing). 12/03/21   Swayze, Ava, DO  lansoprazole (PREVACID) 30 MG capsule Take 30 mg by mouth every morning.    [provider]  levothyroxine (SYNTHROID) 75 MCG tablet Take 75 mcg by mouth at bedtime.    [provider]  losartan-hydrochlorothiazide  (HYZAAR) 100-25 MG tablet Take 1 tablet by mouth every morning.    [provider]  Melatonin 5 MG SUBL Place 5 mg under the tongue at bedtime.    [provider]  Menthol, Topical Analgesic, 5 % GEL Apply 1 application  topically See admin instructions. Apply to the neck every morning    [provider]  metoprolol succinate (TOPROL-XL) 50 MG 24 hr tablet Take 50 mg by mouth daily. Take with or immediately following a meal.    [provider]  Omega-3 Fatty Acids (FISH OIL) 1000 MG CAPS Take 1,000 mg by mouth 3 (three) times daily.    [provider]  OXYGEN Inhale 2 L/min into the lungs as needed (for shortness of breath).    [provider]  Propyl Glycol-Hydroxyethylcell (NASAL MOIST) GEL Place 1 application  into both nostrils daily.    [provider]  Skin Protectants, Misc. (EUCERIN) cream Apply 1 Application topically See admin instructions. Apply to dry skin daily and as needed    [provider]  SYMBICORT 80-4.5 MCG/ACT inhaler Inhale 2 puffs into the lungs 2 (two) times daily.    [provider]  Tiotropium Bromide Monohydrate (SPIRIVA  RESPIMAT) 2.5 MCG/ACT AERS Inhale 2.5 mcg into the lungs in the morning.    [provider]    Allergies:   Allergies  Allergen Reactions   Guaifenesin Anaphylaxis   Kiwi Extract Anaphylaxis and Rash   Levaquin [Levofloxacin In D5w] Shortness Of Breath and Rash    Tolerated PO Cipro 01/04/22   Strawberry Extract Anaphylaxis and Rash   Baclofen Other (See Comments)    Near syncope/ fall    Social History:  reports that she quit smoking about 2 years ago. Her smoking use included cigarettes. She started smoking about 38 years ago. She has a 18.00 pack-year smoking history. She has never used smokeless tobacco. She reports that she does not drink alcohol and does not use drugs.  Family History: Family History  Problem Relation Age of Onset   Diabetes Mother     COPD Sister    Heart failure Sister     Physical Exam: Vitals:   09/07/22 0215 09/07/22 0230 09/07/22 0245 09/07/22 0300  BP: (!) 158/107 (!) 156/92 (!) 132/90 (!) 141/102  Pulse: (!) 117 (!) 116 (!) 116 (!) 116  Resp:    18  Temp:      TempSrc:      SpO2: 99% 99% 92% 93%  Weight:      Height:        General:  Alert and oriented times three, well developed and nourished, no acute distress Eyes: PERRLA, pink conjunctiva, no scleral icterus ENT: Moist oral mucosa, neck supple, no thyromegaly Lungs: clear to ascultation, no wheeze, no crackles, no use of accessory muscles Cardiovascular: regular rate and rhythm, no regurgitation, no gallops, no murmurs. No carotid bruits, no JVD Abdomen: soft, positive BS, non-tender, non-distended, no organomegaly, not an acute abdomen GU: not examined Neuro: CN II - XII grossly intact, sensation intact Musculoskeletal: Left-sided upper and lower extremity weakness.  Left lower extremity +1 pitting Skin: no rash, no subcutaneous crepitation, no decubitus Psych: appropriate patient  Labs on Admission:  Recent Labs    09/06/22 1938  NA 128*  K 4.9  CL 91*  CO2 26  GLUCOSE 229*  BUN 22*  CREATININE 1.62*  CALCIUM 9.3    Recent Labs    09/06/22 1938  WBC 15.0*  NEUTROABS 9.8*  HGB 13.1  HCT 42.1  MCV 87.7  PLT 294    Recent Labs    09/06/22 1938  DDIMER 0.39     Micro Results: Recent Results (from the past 240 hour(s))  Blood culture (routine x 2)     Status: None   Collection Time: 08/29/22  6:35 PM   Specimen: BLOOD RIGHT FOREARM  Result Value Ref Range Status   Specimen Description   Final    BLOOD RIGHT FOREARM Performed at [facility], [address], [place], [place] [address]    Special Requests   Final    BOTTLES DRAWN AEROBIC AND ANAEROBIC Blood Culture adequate volume Performed at [facility], [address], [place], [place] [address]    Culture   Final    NO GROWTH 5  DAYS Performed at [facility], [address], [place], [place] [address]    Report Status 09/03/2022 FINAL  Final  Blood culture (routine x 2)     Status: None   Collection Time: 08/29/22  8:32 PM   Specimen: BLOOD  Result Value Ref Range Status   Specimen Description   Final    BLOOD SITE NOT  SPECIFIED Performed at [facility], [address] [place], [place] [address]    Special Requests   Final    BOTTLES DRAWN AEROBIC AND ANAEROBIC Blood Culture adequate volume Performed at [facility], [address], [place], [place] [address]    Culture   Final    NO GROWTH 5 DAYS Performed at [facility], [address], [place], [place] [address]    Report Status 09/03/2022 FINAL  Final  Urine Culture     Status: Abnormal   Collection Time: 08/30/22 12:23 AM   Specimen: Urine, Clean Catch  Result Value Ref Range Status   Specimen Description   Final    URINE, CLEAN CATCH Performed at [facility], [address] [place], [place] [address]    Special Requests   Final    NONE Performed at [facility], [address] [place], [place] [address]    Culture (A)  Final    50,000 COLONIES/mL ESCHERICHIA COLI Confirmed Extended Spectrum Beta-Lactamase Producer (ESBL).  In bloodstream infections from ESBL organisms, carbapenems are preferred over piperacillin/tazobactam. They are shown to have a lower risk of mortality.    Report Status 09/02/2022 FINAL  Final   Organism ID, Bacteria ESCHERICHIA COLI (A)  Final      Susceptibility   Escherichia coli - MIC*    AMPICILLIN >=32 RESISTANT Resistant     CEFAZOLIN >=64 RESISTANT Resistant     CEFEPIME 16 RESISTANT Resistant     CEFTRIAXONE >=64 RESISTANT Resistant     CIPROFLOXACIN 0.5 INTERMEDIATE Intermediate     GENTAMICIN >=16 RESISTANT Resistant     IMIPENEM <=0.25 SENSITIVE Sensitive     NITROFURANTOIN <=16 SENSITIVE Sensitive      TRIMETH/SULFA >=320 RESISTANT Resistant     AMPICILLIN/SULBACTAM >=32 RESISTANT Resistant     PIP/TAZO <=4 SENSITIVE Sensitive     * 50,000 COLONIES/mL ESCHERICHIA COLI  MRSA Next Gen by PCR, Nasal     Status: None   Collection Time: 08/30/22  5:30 AM   Specimen: Nasal Mucosa; Nasal Swab  Result Value Ref Range Status   MRSA by PCR Next Gen NOT DETECTED NOT DETECTED Final    Comment: (NOTE) The GeneXpert MRSA Assay (FDA approved for NASAL specimens only), is one component of a comprehensive MRSA colonization surveillance program. It is not intended to diagnose MRSA infection nor to guide or monitor treatment for MRSA infections. Test performance is not FDA approved in patients less than 30 years old. Performed at [facility], [address], [place], [place] [address]   Resp Panel by RT-PCR (Flu A&B, Covid) Anterior Nasal Swab     Status: None   Collection Time: 09/06/22  7:38 PM   Specimen: Anterior Nasal Swab  Result Value Ref Range Status   SARS Coronavirus 2 by RT PCR NEGATIVE NEGATIVE Final    Comment: (NOTE) SARS-CoV-2 target nucleic acids are NOT DETECTED.  The SARS-CoV-2 RNA is generally detectable in upper respiratory specimens during the acute phase of infection. The lowest concentration of SARS-CoV-2 viral copies this assay can detect is 138 copies/mL. A negative result does not preclude SARS-Cov-2 infection and should not be used as the sole basis for treatment or other patient management decisions. A negative result may occur with  improper specimen collection/handling, submission of specimen other than nasopharyngeal swab, presence of viral mutation(s) within the areas targeted by this assay, and inadequate number of viral copies(<138 copies/mL). A negative result  must be combined with clinical observations, patient history, and epidemiological information. The expected result is Negative.  Fact Sheet for Patients:   BloggerCourse.comhttps://www.fda.gov/media/152166/download  Fact Sheet for Healthcare Providers:  SeriousBroker.ithttps://www.fda.gov/media/152162/download  This test is no t yet approved or cleared by the [place] FDA and  has been authorized for detection and/or diagnosis of SARS-CoV-2 by FDA under an Emergency Use Authorization (EUA). This EUA will remain  in effect (meaning this test can be used) for the duration of the COVID-19 declaration under Section 564(b)(1) of the Act, 21 U.S.C.section 360bbb-3(b)(1), unless the authorization is terminated  or revoked sooner.       Influenza A by PCR NEGATIVE NEGATIVE Final   Influenza B by PCR NEGATIVE NEGATIVE Final    Comment: (NOTE) The Xpert Xpress SARS-CoV-2/FLU/RSV plus assay is intended as an aid in the diagnosis of influenza from Nasopharyngeal swab specimens and should not be used as a sole basis for treatment. Nasal washings and aspirates are unacceptable for Xpert Xpress SARS-CoV-2/FLU/RSV testing.  Fact Sheet for Patients: BloggerCourse.comhttps://www.fda.gov/media/152166/download  Fact Sheet for Healthcare Providers: SeriousBroker.ithttps://www.fda.gov/media/152162/download  This test is not yet approved or cleared by the [place] FDA and has been authorized for detection and/or diagnosis of SARS-CoV-2 by FDA under an Emergency Use Authorization (EUA). This EUA will remain in effect (meaning this test can be used) for the duration of the COVID-19 declaration under Section 564(b)(1) of the Act, 21 U.S.C. section 360bbb-3(b)(1), unless the authorization is terminated or revoked.  Performed at [facility], [address] [place], [place] [address]      Radiological Exams on Admission: CT Renal Stone Study  Result Date: 09/07/2022 CLINICAL DATA:  Abdominal pain.  Concern for kidney stone. EXAM: CT ABDOMEN AND PELVIS WITHOUT CONTRAST TECHNIQUE: Multidetector CT imaging of the abdomen and pelvis was performed following the standard protocol without  IV contrast. RADIATION DOSE REDUCTION: This exam was performed according to the departmental dose-optimization program which includes automated exposure control, adjustment of the mA and/or kV according to patient size and/or use of iterative reconstruction technique. COMPARISON:  CT abdomen pelvis dated 01/02/2022. FINDINGS: Evaluation of this exam is limited in the absence of intravenous contrast. Lower chest: Faint nodular clusters of ground-glass opacity at the lung bases bilaterally suspicious for developing infiltrate. Aspiration is not excluded. Clinical correlation is recommended. No intra-abdominal free air or free fluid. Hepatobiliary: Fatty liver. No biliary dilatation. The gallbladder is unremarkable. Pancreas: Unremarkable. No pancreatic ductal dilatation or surrounding inflammatory changes. Spleen: Normal in size without focal abnormality. Adrenals/Urinary Tract: The adrenal glands are unremarkable. Moderate right renal parenchyma atrophy and cortical irregularity and scarring. There is no hydronephrosis or nephrolithiasis on either side. The visualized ureters appear unremarkable. The urinary bladder is mildly distended. A small focus of air in the anterior bladder dome may have been introduced via recent inspiration. Correlation with urinalysis recommended to exclude UTI. Stomach/Bowel: There is no bowel obstruction or active inflammation. The appendix is normal. Vascular/Lymphatic: Mild aortoiliac atherosclerotic disease. The IVC is unremarkable. No portal venous gas. There is no adenopathy. Reproductive: The uterus and ovaries are grossly unremarkable. Other: None Musculoskeletal: No acute or significant osseous findings. IMPRESSION: 1. No hydronephrosis or nephrolithiasis. 2. Fatty liver. 3. No bowel obstruction. Normal appendix. 4. Faint nodular clusters of ground-glass opacity at the lung bases bilaterally suspicious for developing infiltrate. Aspiration is not excluded. 5.  Aortic  Atherosclerosis (ICD10-I70.0). Electronically Signed   By: Elgie CollardArash  Radparvar M.D.   On: 09/07/2022 01:17   DG  Chest 2 View  Result Date: 09/06/2022 CLINICAL DATA:  Dysuria and flank pain. EXAM: CHEST - 2 VIEW COMPARISON:  August 29, 2022 FINDINGS: The lateral views limited in evaluation secondary to positioning of the patient's upper extremity. The heart size and mediastinal contours are within normal limits. Both lungs are clear. The visualized skeletal structures are unremarkable. IMPRESSION: No active cardiopulmonary disease. Electronically Signed   By: Aram Candela M.D.   On: 09/06/2022 19:36    Assessment/Plan Present on Admission:  Urinary sepsis -Admit to med surge -Blood and urine cultures collected -Patient with ESBL UTI 08/30/2022 -Imipenem ordered -IV fluid hydration  Hyponatremia/hypochloremia -IV fluid hydration -BMP in a.m.  Possible aspiration pneumonia -Chemical pneumonitis.  CVA/left-sided weakness -Stable, aspirin, atorvastatin, as needed Flexeril resumed  Chronic constipation -Scheduled MiraLAX  Diabetes mellitus/peripheral neuropathy -Sliding scale insulin resumed -Lantus and Trulicity resumed -Neurontin resumed - Hypertension -Metoprolol resumed  Dyslipidemia -Zetia, atorvastatin, omega-3 fatty acid   Tobacco abuse -Patient stopped smoking 2 weeks ago.  No nicotine patch   Acute renal failure superimposed on stage 3a chronic kidney disease (HCC) -IV fluid hydration -BMP in a.m.  Depression -BuSpar resumed   Chronic respiratory failure with hypoxia (HCC)  COPD with chronic bronchitis (HCC) -Symbicort, Spiriva, as needed nebulizers ordered -Oxygen if needed  Hypertension -ARB/HCTZ on hold.  Gentle IV fluid hydration -Repeat BMP in a.m.   Hypothyroidism -Stable, Synthroid resumed   Mild protein-calorie malnutrition (HCC) -Ensure  GERD -Protonix resumed   History of pulmonary embolism/DVT -Resume Eliquis  Karla Hoover,  Karla Hoover 09/07/2022, 3:59 AM

## 2022-09-07 NOTE — Sepsis Progress Note (Signed)
Following per sepsis protocol   

## 2022-09-07 NOTE — Progress Notes (Signed)
  Progress Note   Patient: Karla Hoover SHF:[id number] DOB: 01/03/1971 DOA: 09/06/2022     0 DOS: the patient was seen and examined on 09/07/2022   Brief hospital course: 51 year old female resident of [facility].  She has a significant past medical history of COPD, nocturnal oxygen use, hypertension, DVT/PE, hypothyroidism, diabetes mellitus, and CVA with left-sided deficits.  She had UTI 2 weeks back, she was treated with antibiotics and completed a course.  Approximately a week ago she found she get up to go to the bathroom on her own she became profoundly weak.  She was coughing very badly.  Both of her sides hurt from coughing.  She reports nausea, anorexia, diarrhea, burning urination.  She reports ongoing burning urination even after completion of her antibiotic course.  She states her urine is foul-smelling.  She denies fever.  She was brought to the ER because of her weakness.   Pt admitted for sepsis with UTI  Assessment and Plan: Present on Admission:  UTI with sepsis present on admission -Blood and urine cultures collected, pending -Patient with ESBL UTI 08/30/2022 -Imipenem continued -IV fluid hydration   Hyponatremia/hypochloremia -continued on IVF hydration -recheck bmet in AM   Possible aspiration pneumonia -Chemical pneumonitis.   CVA/left-sided weakness -Stable, aspirin, atorvastatin, as needed Flexeril resumed   Chronic constipation -Scheduled MiraLAX   Diabetes mellitus/peripheral neuropathy -Lantus and Trulicity resumed -Neurontin resumed -sliding scale not ordered on admit. Have now ordered  Hypertension -Metoprolol continued     Subjective: Tired this AM  Physical Exam: Vitals:   09/07/22 1417 09/07/22 1459 09/07/22 1544 09/07/22 1732  BP: (!) 195/130 (!) 164/116 (!) 173/113 111/72  Pulse: (!) 114  (!) 118 (!) 111  Resp:    19  Temp: 99.1 F (37.3 C)  (!) 100.5 F (38.1 C) 99.7 F (37.6 C)  TempSrc: Oral  Oral Oral  SpO2:  91%  94% 94%  Weight:      Height:       General exam: Conversant, in no acute distress Respiratory system: normal chest rise, clear, no audible wheezing Cardiovascular system: regular rhythm, s1-s2 Gastrointestinal system: Nondistended, nontender, pos BS Central nervous system: No seizures, no tremors Extremities: No cyanosis, no joint deformities Skin: No rashes, no pallor Psychiatry: Affect normal // no auditory hallucinations   Data Reviewed:  There are no new results to review at this time.  Family Communication: Pt in room, family at bedside  Disposition: Status is: Inpatient Remains inpatient appropriate because: Severity of illness  Planned Discharge Destination: Home    Author: Rickey Barbara, MD 09/07/2022 6:48 PM  For on call review [email or web address].

## 2022-09-07 NOTE — ED Notes (Signed)
Admitting provider bedside 

## 2022-09-07 NOTE — Progress Notes (Signed)
Pharmacy Antibiotic Note  Karla Hoover is a 51 y.o. female admitted on 09/06/2022 with Dysuria, Flank Pain, Fever, Cough, and Headache .  Pharmacy has been consulted to dose merrem, prior UTI ESBL  Plan: Merrem 1gm IV q12h Follow renal function and clinical course  Height: 5\' 6"  (167.6 cm) Weight: 86.6 kg (191 lb) IBW/kg (Calculated) : 59.3  Temp (24hrs), Avg:100.6 F (38.1 C), Min:98.7 F (37.1 C), Max:102.9 F (39.4 C)  Recent Labs  Lab 09/06/22 1938 09/06/22 2348  WBC 15.0*  --   CREATININE 1.62*  --   LATICACIDVEN  --  1.5    Estimated Creatinine Clearance: 46 mL/min (A) (by C-G formula based on SCr of 1.62 mg/dL (H)).    Allergies  Allergen Reactions   Guaifenesin Anaphylaxis   Kiwi Extract Anaphylaxis and Rash   Levaquin [Levofloxacin In D5w] Shortness Of Breath and Rash    Tolerated PO Cipro 01/04/22   Strawberry Extract Anaphylaxis and Rash   Baclofen Other (See Comments)    Near syncope/ fall    Thank you for allowing pharmacy to be a part of this patient's care. 01/06/22 RPh 09/07/2022, 5:59 AM

## 2022-09-08 DIAGNOSIS — N12 Tubulo-interstitial nephritis, not specified as acute or chronic: Secondary | ICD-10-CM | POA: Diagnosis not present

## 2022-09-08 DIAGNOSIS — A419 Sepsis, unspecified organism: Secondary | ICD-10-CM | POA: Diagnosis not present

## 2022-09-08 LAB — CBC
HCT: 32.8 % — ABNORMAL LOW (ref 36.0–46.0)
Hemoglobin: 10.5 g/dL — ABNORMAL LOW (ref 12.0–15.0)
MCH: 27.9 pg (ref 26.0–34.0)
MCHC: 32 g/dL (ref 30.0–36.0)
MCV: 87 fL (ref 80.0–100.0)
Platelets: 196 10*3/uL (ref 150–400)
RBC: 3.77 MIL/uL — ABNORMAL LOW (ref 3.87–5.11)
RDW: 13 % (ref 11.5–15.5)
WBC: 11 10*3/uL — ABNORMAL HIGH (ref 4.0–10.5)
nRBC: 0 % (ref 0.0–0.2)

## 2022-09-08 LAB — COMPREHENSIVE METABOLIC PANEL
ALT: 16 U/L (ref 0–44)
AST: 19 U/L (ref 15–41)
Albumin: 2.5 g/dL — ABNORMAL LOW (ref 3.5–5.0)
Alkaline Phosphatase: 65 U/L (ref 38–126)
Anion gap: 8 (ref 5–15)
BUN: 18 mg/dL (ref 6–20)
CO2: 27 mmol/L (ref 22–32)
Calcium: 8.7 mg/dL — ABNORMAL LOW (ref 8.9–10.3)
Chloride: 101 mmol/L (ref 98–111)
Creatinine, Ser: 1.4 mg/dL — ABNORMAL HIGH (ref 0.44–1.00)
GFR, Estimated: 46 mL/min — ABNORMAL LOW (ref >60)
Glucose, Bld: 154 mg/dL — ABNORMAL HIGH (ref 70–99)
Potassium: 4.8 mmol/L (ref 3.5–5.1)
Sodium: 136 mmol/L (ref 135–145)
Total Bilirubin: 0.6 mg/dL (ref 0.3–1.2)
Total Protein: 6.7 g/dL (ref 6.5–8.1)

## 2022-09-08 LAB — GLUCOSE, CAPILLARY
Glucose-Capillary: 161 mg/dL — ABNORMAL HIGH (ref 70–99)
Glucose-Capillary: 173 mg/dL — ABNORMAL HIGH (ref 70–99)
Glucose-Capillary: 217 mg/dL — ABNORMAL HIGH (ref 70–99)
Glucose-Capillary: 276 mg/dL — ABNORMAL HIGH (ref 70–99)

## 2022-09-08 MED ORDER — IPRATROPIUM-ALBUTEROL 0.5-2.5 (3) MG/3ML IN SOLN: 3.0000 mL | Freq: Four times a day (QID) | RESPIRATORY_TRACT | Status: DC | PRN

## 2022-09-08 MED ORDER — SODIUM CHLORIDE 0.9 % IV SOLN
1.0000 g | Freq: Three times a day (TID) | INTRAVENOUS | Status: DC
  Administered 2022-09-08 – 2022-09-10 (×6): 1 g via INTRAVENOUS
  Filled 2022-09-08 (×7): qty 20

## 2022-09-08 NOTE — Progress Notes (Signed)
Pharmacy Antibiotic Note  Karla Hoover is a 51 y.o. female admitted on 09/06/2022 with Dysuria, Flank Pain, Fever, Cough, and Headache .  Pharmacy has been consulted to dose merrem, prior UTI ESBL  Today, 09/08/22 Tmax 100.5, WBC elevated/trending down at 11, SCr down 1.4 and est crcl 70ml/min  Plan: Increase Merrem to 1gm IV q8h Follow renal function and clinical course  Height: 5\' 6"  (167.6 cm) Weight: 86.6 kg (191 lb) IBW/kg (Calculated) : 59.3  Temp (24hrs), Avg:99.3 F (37.4 C), Min:97.8 F (36.6 C), Max:100.5 F (38.1 C)  Recent Labs  Lab 09/06/22 1938 09/06/22 2348 09/08/22 0529  WBC 15.0*  --  11.0*  CREATININE 1.62*  --  1.40*  LATICACIDVEN  --  1.5  --      Estimated Creatinine Clearance: 53.3 mL/min (A) (by C-G formula based on SCr of 1.4 mg/dL (H)).    Allergies  Allergen Reactions   Guaifenesin Anaphylaxis   Kiwi Extract Anaphylaxis and Rash   Levaquin [Levofloxacin] Shortness Of Breath and Rash   Strawberry Extract Anaphylaxis and Rash   Lioresal [Baclofen] Other (See Comments)    Near syncope/ fall    Thank you for allowing pharmacy to be a part of this patient's care.  14/03/23, PharmD, BCPS Clinical Pharmacist [place] Please utilize Amion for appropriate phone number to reach the unit pharmacist [facility]) 09/08/2022 1:47 PM

## 2022-09-08 NOTE — Progress Notes (Signed)
  Progress Note   Patient: Karla Hoover KGU:[id number] DOB: [birth year] DOA: 09/06/2022     1 DOS: the patient was seen and examined on 09/08/2022   Brief hospital course: 51 year old female resident of [facility].  She has a significant past medical history of COPD, nocturnal oxygen use, hypertension, DVT/PE, hypothyroidism, diabetes mellitus, and CVA with left-sided deficits.  She had UTI 2 weeks back, she was treated with antibiotics and completed a course.  Approximately a week ago she found she get up to go to the bathroom on her own she became profoundly weak.  She was coughing very badly.  Both of her sides hurt from coughing.  She reports nausea, anorexia, diarrhea, burning urination.  She reports ongoing burning urination even after completion of her antibiotic course.  She states her urine is foul-smelling.  She denies fever.  She was brought to the ER because of her weakness.   Pt admitted for sepsis with UTI  Assessment and Plan: Present on Admission:  UTI with sepsis present on admission -Blood and urine cultures pending, urine thus far growing ecoli -Of note, patient with ESBL UTI 08/30/2022 -Imipenem continued   Hyponatremia/hypochloremia -normalized with IVF, hold further IVF -recheck bmet in AM   Possible aspiration pneumonia -Chemical pneumonitis.   CVA/left-sided weakness -Stable, aspirin, atorvastatin, as needed Flexeril resumed   Chronic constipation -Scheduled MiraLAX   Diabetes mellitus/peripheral neuropathy -Lantus and Trulicity resumed -Neurontin resumed -sliding scale not ordered on admit. Have now ordered  Hypertension -Metoprolol continued     Subjective: Reports feeling much better today  Physical Exam: Vitals:   09/08/22 0500 09/08/22 0600 09/08/22 0628 09/08/22 1336  BP:   107/74 106/63  Pulse:   92 92  Resp: 16 (!) 21 17 18   Temp:   99.8 F (37.7 C) 99.5 F (37.5 C)  TempSrc:   Oral Oral  SpO2:   95% 91%  Weight:       Height:       General exam: Awake, laying in bed, in nad Respiratory system: Normal respiratory effort, no wheezing Cardiovascular system: regular rate, s1, s2 Gastrointestinal system: Soft, nondistended, positive BS Central nervous system: CN2-12 grossly intact, strength intact Extremities: Perfused, no clubbing Skin: Normal skin turgor, no notable skin lesions seen Psychiatry: Mood normal // no visual hallucinations   Data Reviewed:  Labs reviewed: Na 136, K 4.8, Cr 1.4, WBC 11.0  Family Communication: Pt in room, family at bedside  Disposition: Status is: Inpatient Remains inpatient appropriate because: Severity of illness  Planned Discharge Destination: Home    Author: , MD 09/08/2022 6:01 PM  For on call review [email or web address].

## 2022-09-09 DIAGNOSIS — A419 Sepsis, unspecified organism: Secondary | ICD-10-CM | POA: Diagnosis not present

## 2022-09-09 DIAGNOSIS — N12 Tubulo-interstitial nephritis, not specified as acute or chronic: Secondary | ICD-10-CM | POA: Diagnosis not present

## 2022-09-09 LAB — GLUCOSE, CAPILLARY
Glucose-Capillary: 155 mg/dL — ABNORMAL HIGH (ref 70–99)
Glucose-Capillary: 160 mg/dL — ABNORMAL HIGH (ref 70–99)
Glucose-Capillary: 222 mg/dL — ABNORMAL HIGH (ref 70–99)
Glucose-Capillary: 188 mg/dL — ABNORMAL HIGH (ref 70–99)

## 2022-09-09 LAB — COMPREHENSIVE METABOLIC PANEL
ALT: 16 U/L (ref 0–44)
AST: 29 U/L (ref 15–41)
Albumin: 2.5 g/dL — ABNORMAL LOW (ref 3.5–5.0)
Alkaline Phosphatase: 58 U/L (ref 38–126)
Anion gap: 7 (ref 5–15)
BUN: 17 mg/dL (ref 6–20)
CO2: 25 mmol/L (ref 22–32)
Calcium: 8.6 mg/dL — ABNORMAL LOW (ref 8.9–10.3)
Chloride: 105 mmol/L (ref 98–111)
Creatinine, Ser: 1.34 mg/dL — ABNORMAL HIGH (ref 0.44–1.00)
GFR, Estimated: 48 mL/min — ABNORMAL LOW (ref >60)
Glucose, Bld: 167 mg/dL — ABNORMAL HIGH (ref 70–99)
Potassium: 4.7 mmol/L (ref 3.5–5.1)
Sodium: 137 mmol/L (ref 135–145)
Total Bilirubin: 0.7 mg/dL (ref 0.3–1.2)
Total Protein: 6.2 g/dL — ABNORMAL LOW (ref 6.5–8.1)

## 2022-09-09 LAB — CBC
HCT: 32.4 % — ABNORMAL LOW (ref 36.0–46.0)
Hemoglobin: 10.2 g/dL — ABNORMAL LOW (ref 12.0–15.0)
MCH: 27.8 pg (ref 26.0–34.0)
MCHC: 31.5 g/dL (ref 30.0–36.0)
MCV: 88.3 fL (ref 80.0–100.0)
Platelets: 194 10*3/uL (ref 150–400)
RBC: 3.67 MIL/uL — ABNORMAL LOW (ref 3.87–5.11)
RDW: 13.1 % (ref 11.5–15.5)
WBC: 7.4 10*3/uL (ref 4.0–10.5)
nRBC: 0 % (ref 0.0–0.2)

## 2022-09-09 LAB — URINE CULTURE: Culture: >=100000 — AB

## 2022-09-09 LAB — HEMOGLOBIN A1C
Hgb A1c MFr Bld: 10.5 % — ABNORMAL HIGH (ref 4.8–5.6)
Mean Plasma Glucose: 255 mg/dL

## 2022-09-09 NOTE — Evaluation (Signed)
Occupational Therapy Evaluation Patient Details Name: Karla Hoover MRN: [id number] DOB: 1971/02/14 Today's Date: 09/09/2022   History of Present Illness Patient is a 51 year old female who presented from LTC SNF with recent UTI with nausea, diarrhea and burning urination. Patien was admitted for sepsis with UTI. PMH: CVA L side weakness, DM,   Clinical Impression   Patient is a 51 year old female who was admitted for above. Patient reported living at [facility] working with therapy. Patient was min A for transfers with notable drop foot on LLE. Patient reported having no brace for foot or LUE at SNF. Patient wears personal wrist brace to prevent "tingling feeling". Patient was noted to have edema in LUE, decreased functional activity tolerance, decreased standing balance, and decreased safety awareness compared to PLOF patient and mother endorsed at this time. Patient would continue to benefit from skilled OT services at this time while admitted and after d/c to address noted deficits in order to improve overall safety and independence in ADLs.       Recommendations for follow up therapy are one component of a multi-disciplinary discharge planning process, led by the attending physician.  Recommendations may be updated based on patient status, additional functional criteria and insurance authorization.   Follow Up Recommendations  Skilled nursing-short term rehab (<3 hours/day)     Assistance Recommended at Discharge Frequent or constant Supervision/Assistance  Patient can return home with the following A little help with walking and/or transfers;A lot of help with bathing/dressing/bathroom;Direct supervision/assist for medications management;Direct supervision/assist for financial management;Assistance with cooking/housework;Help with stairs or ramp for entrance    Functional Status Assessment  Patient has had a recent decline in their functional status and demonstrates the ability to make  significant improvements in function in a reasonable and predictable amount of time.  Equipment Recommendations  None recommended by OT    Recommendations for Other Services       Precautions / Restrictions Precautions Precaution Comments: L side hemi, personal brace on L wrist Restrictions Weight Bearing Restrictions: No      Mobility Bed Mobility Overal bed mobility: Needs Assistance Bed Mobility: Supine to Sit     Supine to sit: Min assist          Balance Overall balance assessment: Needs assistance Sitting-balance support: No upper extremity supported, Feet supported Sitting balance-Leahy Scale: Fair     Standing balance support: Single extremity supported, During functional activity, Reliant on assistive device for balance Standing balance-Leahy Scale: Poor         ADL either performed or assessed with clinical judgement   ADL Overall ADL's : Needs assistance/impaired Eating/Feeding: Modified independent;Sitting Eating/Feeding Details (indicate cue type and reason): in recliner Grooming: Sitting;Minimal assistance   Upper Body Bathing: Moderate assistance;Sitting   Lower Body Bathing: Maximal assistance;Sitting/lateral leans   Upper Body Dressing : Moderate assistance;Sitting   Lower Body Dressing: Sitting/lateral leans;Moderate assistance Lower Body Dressing Details (indicate cue type and reason): patient was able to don pants over half way at bed level with physical assist to pull them up in standing and to retrieve off floor when falling down on first attempt to pull up in standing. Toilet Transfer: Minimal assistance;Rolling walker (2 wheels) Toilet Transfer Details (indicate cue type and reason): patient typically uses hemiwalker at SNF. none available in room with physical assist provided for patient to be able to use RW to complete functional mobility in room from one side of bed to other side of room x2 Toileting- Clothing Manipulation  and Hygiene:  Moderate assistance;Sit to/from stand               Vision Patient Visual Report: No change from baseline              Pertinent Vitals/Pain Pain Assessment Pain Assessment: No/denies pain     Hand Dominance Right   Extremity/Trunk Assessment Upper Extremity Assessment Upper Extremity Assessment: LUE deficits/detail LUE Deficits / Details: noted to have minimal movement of digits, not functional, patient and mother in room hopefull that patient will be able to engage in surgery for nerve stimulation. patient wears wrist brace " to help with tingliness" LUE Sensation: decreased proprioception;decreased light touch LUE Coordination: decreased fine motor;decreased gross motor   Lower Extremity Assessment Lower Extremity Assessment: Defer to PT evaluation (noted to have foot drop on L side. no brace)       Communication Communication Communication: No difficulties   Cognition Arousal/Alertness: Awake/alert Behavior During Therapy: WFL for tasks assessed/performed Overall Cognitive Status: Within Functional Limits for tasks assessed             General Comments: potentially poor safety awareness at times?                Home Living Family/patient expects to be discharged to:: Skilled nursing facility               Additional Comments: [facility]      Prior Functioning/Environment         Mobility Comments: pt reports mod I with hemi walker ADLs Comments: pt reports sup-MOD I for dressing; MIN A for bathing with shower chair, I for feeding        OT Problem List: Decreased strength;Decreased coordination;Decreased range of motion;Decreased activity tolerance;Decreased safety awareness;Decreased knowledge of precautions;Impaired UE functional use;Impaired balance (sitting and/or standing)      OT Treatment/Interventions: Self-care/ADL training;Therapeutic exercise;Therapeutic activities;Neuromuscular education;Energy  conservation;Patient/family education;DME and/or AE instruction;Balance training    OT Goals(Current goals can be found in the care plan section) Acute Rehab OT Goals Patient Stated Goal: to get LUE functional again. OT Goal Formulation: With patient/family Time For Goal Achievement: 09/23/22 Potential to Achieve Goals: Fair  OT Frequency: Min 2X/week    Co-evaluation PT/OT/SLP Co-Evaluation/Treatment: Yes Reason for Co-Treatment: To address functional/ADL transfers;For patient/therapist safety PT goals addressed during session: Mobility/safety with mobility OT goals addressed during session: ADL's and self-care      AM-PAC OT "6 Clicks" Daily Activity     Outcome Measure Help from another person eating meals?: A Little Help from another person taking care of personal grooming?: A Little Help from another person toileting, which includes using toliet, bedpan, or urinal?: A Lot Help from another person bathing (including washing, rinsing, drying)?: A Lot Help from another person to put on and taking off regular upper body clothing?: A Lot Help from another person to put on and taking off regular lower body clothing?: A Lot 6 Click Score: 14   End of Session Equipment Utilized During Treatment: Gait belt;Rolling walker (2 wheels) Nurse Communication: Mobility status  Activity Tolerance: Patient tolerated treatment well Patient left: in chair;with chair alarm set;with call bell/phone within reach;with family/visitor present  OT Visit Diagnosis: Unsteadiness on feet (R26.81);Other abnormalities of gait and mobility (R26.89);History of falling (Z91.81);Muscle weakness (generalized) (M62.81);Hemiplegia and hemiparesis Hemiplegia - Right/Left: Left Hemiplegia - dominant/non-dominant: Non-Dominant                Time: 7371-0626 OT Time Calculation (min): 25 min Charges:  OT  General Charges $OT Visit: 1 Visit OT Evaluation $OT Eval Moderate Complexity: 1 Mod  Quenisha Lovins OTR/L, MS Acute  Rehabilitation Department Office# [phone]   Selinda Flavin 09/09/2022, 1:38 PM

## 2022-09-09 NOTE — TOC Progression Note (Signed)
Transition of Care [facility]) - Progression Note    Patient Details  Name: Karla Hoover MRN: [id number] Date of Birth: March 26, 1971  [facility]) CM/SW Contact  Beckie Busing, RN Phone [phone]  09/09/2022, 4:17 PM  Clinical Narrative:    [facility] acknowledges consult for SNF.  Per documentation patient is from [place] long term care. CM will follow to confirm.         Expected Discharge Plan and Services                                                 Social Determinants of Health (SDOH) Interventions    Readmission Risk Interventions    01/04/2022    2:25 PM 12/03/2021    9:55 AM  Readmission Risk Prevention Plan  Transportation Screening Complete Complete  PCP or Specialist Appt within 3-5 Days Complete Complete  HRI or Home Care Consult Complete Complete  Social Work Consult for Recovery Care Planning/Counseling  Complete  Palliative Care Screening Not Applicable Complete  Medication Review [employer]) Complete Complete

## 2022-09-09 NOTE — Evaluation (Signed)
Physical Therapy Evaluation Patient Details Name: Karla Hoover MRN: [id number] DOB: [birth year] Today's Date: 09/09/2022  History of Present Illness  Patient is a 51 year old female who presented from LTC SNF with recent UTI with nausea, diarrhea and burning urination. [facility] was admitted for sepsis with UTI. PMH: CVA L side weakness, DM,  Clinical Impression  Pt admitted with above diagnosis.  Pt currently with functional limitations due to the deficits listed below (see PT Problem List). Pt will benefit from skilled PT to increase their independence and safety with mobility to allow discharge to the venue listed below.    The  patient is eager to ambulate, uses a hemiwalker and WC at baseline at LTC. Plans Are to return to LTC facility.. Intermittently  able to  get PT at facility. Patient  appears near  her baseline.        Recommendations for follow up therapy are one component of a multi-disciplinary discharge planning process, led by the attending physician.  Recommendations may be updated based on patient status, additional functional criteria and insurance authorization.  Follow Up Recommendations Long-term institutional care without follow-up therapy Can patient physically be transported by private vehicle: Yes    Assistance Recommended at Discharge Set up Supervision/Assistance  Patient can return home with the following  A little help with walking and/or transfers;A little help with bathing/dressing/bathroom;Assist for transportation    Equipment Recommendations None recommended by PT  Recommendations for Other Services       Functional Status Assessment Patient has not had a recent decline in their functional status     Precautions / Restrictions Precautions Precautions: Fall Precaution Comments: L side hemi, personal brace on L wrist Restrictions Weight Bearing Restrictions: No      Mobility  Bed Mobility Overal bed mobility: Needs Assistance Bed Mobility:  Supine to Sit     Supine to sit: Min guard, Min assist     General bed mobility comments: ezxtra time  to  move legs over bed edge and sitting up and scooting    Transfers Overall transfer level: Needs assistance Equipment used: Rolling walker (2 wheels) Transfers: Sit to/from Stand Sit to Stand: Min assist           General transfer comment: Assisted Left hand onto RW.    Ambulation/Gait Ambulation/Gait assistance: Min assist Gait Distance (Feet): 20 Feet (x 2) Assistive device: Rolling walker (2 wheels) Gait Pattern/deviations: Step-to pattern, Decreased step length - left, Knee hyperextension - left, Steppage Gait velocity: decr     General Gait Details: LLE drags to , foot drop noted, Using RW  but  shifts to the right.  Stairs            Wheelchair Mobility    Modified Rankin (Stroke Patients Only)       Balance Overall balance assessment: Needs assistance Sitting-balance support: No upper extremity supported, Feet supported Sitting balance-Leahy Scale: Fair     Standing balance support: Single extremity supported, During functional activity, Reliant on assistive device for balance Standing balance-Leahy Scale: Poor                               Pertinent Vitals/Pain Pain Assessment Pain Assessment: No/denies pain    Home Living Family/patient expects to be discharged to:: Skilled nursing facility                   Additional Comments: [facility]  Prior Function               Mobility Comments: pt reports mod I with hemi walker ADLs Comments: pt reports sup-MOD I for dressing; MIN A for bathing with shower chair, I for feeding     Hand Dominance   Dominant Hand: Right    Extremity/Trunk Assessment   Upper Extremity Assessment Upper Extremity Assessment: Defer to OT evaluation LUE Deficits / Details: noted to have minimal movement of digits, not functional, patient and mother in room hopefull that  patient will be able to engage in surgery for nerve stimulation. patient wears wrist brace " to help with tingliness" LUE Sensation: decreased proprioception;decreased light touch LUE Coordination: decreased fine motor;decreased gross motor    Lower Extremity Assessment Lower Extremity Assessment: LLE deficits/detail LLE Deficits / Details: foot drop noted, has gross  motor to step to    Cervical / Trunk Assessment Cervical / Trunk Assessment: Other exceptions Cervical / Trunk Exceptions: trunk rotates to Left  Communication   Communication: No difficulties  Cognition Arousal/Alertness: Awake/alert Behavior During Therapy: WFL for tasks assessed/performed Overall Cognitive Status: Within Functional Limits for tasks assessed                                 General Comments: potentially poor safety awareness at times?        General Comments      Exercises     Assessment/Plan    PT Assessment Patient needs continued PT services  PT Problem List Decreased strength;Decreased balance;Decreased knowledge of precautions;Decreased range of motion;Decreased mobility;Decreased cognition;Decreased knowledge of use of DME;Decreased activity tolerance;Impaired sensation       PT Treatment Interventions DME instruction;Functional mobility training;Balance training;Patient/family education;Gait training;Neuromuscular re-education    PT Goals (Current goals can be found in the Care Plan section)  Acute Rehab PT Goals Patient Stated Goal: to walk more, have therapy PT Goal Formulation: With patient/family Time For Goal Achievement: 09/23/22 Potential to Achieve Goals: Good    Frequency Min 2X/week     Co-evaluation PT/OT/SLP Co-Evaluation/Treatment: Yes Reason for Co-Treatment: For patient/therapist safety;To address functional/ADL transfers PT goals addressed during session: Mobility/safety with mobility OT goals addressed during session: ADL's and self-care        AM-PAC PT "6 Clicks" Mobility  Outcome Measure Help needed turning from your back to your side while in a flat bed without using bedrails?: A Little Help needed moving from lying on your back to sitting on the side of a flat bed without using bedrails?: A Little Help needed moving to and from a bed to a chair (including a wheelchair)?: A Little Help needed standing up from a chair using your arms (e.g., wheelchair or bedside chair)?: A Little Help needed to walk in hospital room?: A Lot Help needed climbing 3-5 steps with a railing? : Total 6 Click Score: 15    End of Session Equipment Utilized During Treatment: Gait belt Activity Tolerance: Patient tolerated treatment well Patient left: in chair;with call bell/phone within reach;with chair alarm set;with family/visitor present Nurse Communication: Mobility status PT Visit Diagnosis: Unsteadiness on feet (R26.81);Difficulty in walking, not elsewhere classified (R26.2);Other symptoms and signs involving the nervous system (R29.898);Hemiplegia and hemiparesis Hemiplegia - Right/Left: Left Hemiplegia - dominant/non-dominant: Non-dominant    Time: 6144-3154 PT Time Calculation (min) (ACUTE ONLY): 23 min   Charges:   PT Evaluation $PT Eval Low Complexity: 1 Low  Blanchard Kelch PT Acute Rehabilitation Services Office [phone] Weekend [phone]   Rada Hay 09/09/2022, 2:27 PM

## 2022-09-09 NOTE — Progress Notes (Addendum)
  Progress Note   Patient: Karla Hoover WUJ:[id number] DOB: 1970-10-09 DOA: 09/06/2022     2 DOS: the patient was seen and examined on 09/09/2022   Brief hospital course: 51 year old female resident of [facility].  She has a significant past medical history of COPD, nocturnal oxygen use, hypertension, DVT/PE, hypothyroidism, diabetes mellitus, and CVA with left-sided deficits.  She had UTI 2 weeks back, she was treated with antibiotics and completed a course.  Approximately a week ago she found she get up to go to the bathroom on her own she became profoundly weak.  She was coughing very badly.  Both of her sides hurt from coughing.  She reports nausea, anorexia, diarrhea, burning urination.  She reports ongoing burning urination even after completion of her antibiotic course.  She states her urine is foul-smelling.  She denies fever.  She was brought to the ER because of her weakness.   Pt admitted for sepsis with UTI  Assessment and Plan: Present on Admission:  UTI with sepsis present on admission -Blood and urine cultures pending, urine thus far growing ecoli -Of note, patient with ESBL UTI 08/30/2022 -Imipenem initially started. Will plan to transition to PO cipro at time of d/c to complete total 5 days of tx   Hyponatremia/hypochloremia -normalized with IVF, hold further IVF -recheck bmet in AM   Possible aspiration pneumonia -Chemical pneumonitis.   CVA/left-sided weakness -Stable, aspirin, atorvastatin, as needed Flexeril resumed   Chronic constipation -Scheduled MiraLAX   Diabetes mellitus/peripheral neuropathy -Lantus and Trulicity resumed -Neurontin resumed -continue SSI as needed  Hypertension -Metoprolol continued -BP stable and controlled     Subjective: Reports feeling much better  Physical Exam: Vitals:   09/08/22 2118 09/09/22 0600 09/09/22 0858 09/09/22 1354  BP: 105/60 109/77  120/75  Pulse: 100 77  81  Resp: 17 18  20   Temp: 100.2 F  (37.9 C) 98.1 F (36.7 C)  99 F (37.2 C)  TempSrc: Oral Oral  Oral  SpO2: 90% 95% (!) 87% 93%  Weight:      Height:       General exam: Conversant, in no acute distress Respiratory system: normal chest rise, clear, no audible wheezing Cardiovascular system: regular rhythm, s1-s2 Gastrointestinal system: Nondistended, nontender, pos BS Central nervous system: No seizures, no tremors Extremities: No cyanosis, no joint deformities Skin: No rashes, no pallor Psychiatry: Affect normal // no auditory hallucinations   Data Reviewed:  Labs reviewed: Na 137, K 4.7, Cr 1.34, WBC 7.4  Family Communication: Pt in room, family not at bedside  Disposition: Status is: Inpatient Remains inpatient appropriate because: Severity of illness  Planned Discharge Destination: Home and Skilled nursing facility    Author: , MD 09/09/2022 5:41 PM  For on call review [email or web address].

## 2022-09-10 DIAGNOSIS — N12 Tubulo-interstitial nephritis, not specified as acute or chronic: Secondary | ICD-10-CM | POA: Diagnosis not present

## 2022-09-10 DIAGNOSIS — A419 Sepsis, unspecified organism: Secondary | ICD-10-CM | POA: Diagnosis not present

## 2022-09-10 LAB — COMPREHENSIVE METABOLIC PANEL
ALT: 18 U/L (ref 0–44)
AST: 27 U/L (ref 15–41)
Albumin: 2.6 g/dL — ABNORMAL LOW (ref 3.5–5.0)
Alkaline Phosphatase: 60 U/L (ref 38–126)
Anion gap: 7 (ref 5–15)
BUN: 16 mg/dL (ref 6–20)
CO2: 29 mmol/L (ref 22–32)
Calcium: 9 mg/dL (ref 8.9–10.3)
Chloride: 103 mmol/L (ref 98–111)
Creatinine, Ser: 1.24 mg/dL — ABNORMAL HIGH (ref 0.44–1.00)
GFR, Estimated: 53 mL/min — ABNORMAL LOW (ref >60)
Glucose, Bld: 147 mg/dL — ABNORMAL HIGH (ref 70–99)
Potassium: 4.2 mmol/L (ref 3.5–5.1)
Sodium: 139 mmol/L (ref 135–145)
Total Bilirubin: 0.3 mg/dL (ref 0.3–1.2)
Total Protein: 6.7 g/dL (ref 6.5–8.1)

## 2022-09-10 LAB — GLUCOSE, CAPILLARY
Glucose-Capillary: 122 mg/dL — ABNORMAL HIGH (ref 70–99)
Glucose-Capillary: 146 mg/dL — ABNORMAL HIGH (ref 70–99)

## 2022-09-10 LAB — CBC
HCT: 32.3 % — ABNORMAL LOW (ref 36.0–46.0)
Hemoglobin: 10.1 g/dL — ABNORMAL LOW (ref 12.0–15.0)
MCH: 27.4 pg (ref 26.0–34.0)
MCHC: 31.3 g/dL (ref 30.0–36.0)
MCV: 87.5 fL (ref 80.0–100.0)
Platelets: 204 10*3/uL (ref 150–400)
RBC: 3.69 MIL/uL — ABNORMAL LOW (ref 3.87–5.11)
RDW: 12.9 % (ref 11.5–15.5)
WBC: 6 10*3/uL (ref 4.0–10.5)
nRBC: 0 % (ref 0.0–0.2)

## 2022-09-10 MED ORDER — GABAPENTIN 400 MG PO CAPS: 400.0000 mg | ORAL_CAPSULE | Freq: Three times a day (TID) | ORAL | 0 refills | Status: DC

## 2022-09-10 MED ORDER — APIXABAN 5 MG PO TABS: ORAL_TABLET | ORAL | 0 refills | Status: AC

## 2022-09-10 MED ORDER — CIPROFLOXACIN HCL 500 MG PO TABS: 500.0000 mg | ORAL_TABLET | Freq: Two times a day (BID) | ORAL | 0 refills | Status: DC

## 2022-09-10 NOTE — TOC Transition Note (Signed)
Transition of Care [facility]) - CM/SW Discharge Note   Patient Details  Name: STEFANY STARACE MRN: [id number] Date of Birth: 03-07-1971  Transition [facility]) CM/SW Contact:  Coralyn Helling, LCSW Phone Number: 09/10/2022, 10:46 AM   Clinical Narrative:   Patient to return to [facility] by ambulance. RN # for report  [phone]. Packet at nursing station.     Final next level of care: Skilled Nursing Facility Barriers to Discharge: No Barriers Identified   Patient Goals and CMS Choice   CMS Medicare.gov Compare Post Acute Care list provided to:: Patient Choice offered to / list presented to : Patient  Discharge Placement              Patient chooses bed at: [facility] (Admitted from facility.) Patient to be transferred to facility by: [facility]      Discharge Plan and Services                                     Social Determinants of Health (SDOH) Interventions     Readmission Risk Interventions    01/04/2022    2:25 PM 12/03/2021    9:55 AM  Readmission Risk Prevention Plan  Transportation Screening Complete Complete  PCP or Specialist Appt within 3-5 Days Complete Complete  HRI or Home Care Consult Complete Complete  Social Work Consult for Recovery Care Planning/Counseling  Complete  Palliative Care Screening Not Applicable Complete  Medication Review [employer]) Complete Complete

## 2022-09-10 NOTE — Discharge Summary (Signed)
Physician Discharge Summary   Patient: Karla Hoover MRN: [id number] DOB: 12-02-1970  Admit date:     09/06/2022  Discharge date: 09/10/22  Discharge Physician: Marylu Lund   PCP: Pcp, No   Recommendations at discharge:    Follow up with PCP in 1-2 weeks Begin ciprofloxacin on 09/10/22 PM, complete through 09/11/22 to complete total 5 days course of abx  Discharge Diagnoses: Active Problems:   Acute renal failure superimposed on stage 3a chronic kidney disease (HCC)   Chronic respiratory failure with hypoxia (HCC)   History of thromboembolism   Hypothyroidism   Essential hypertension   COPD with chronic bronchitis (HCC)   History of pulmonary embolism   Hemiparesis affecting left side as late effect of cerebrovascular accident (CVA) ([place])   DM (diabetes mellitus), type 2 (HCC)   History of stroke   Tobacco abuse   Chronic constipation   Mild protein-calorie malnutrition (HCC)   COPD (chronic obstructive pulmonary disease) ([place])   Sepsis ([place])  Resolved Problems:   * No resolved hospital problems. *  Hospital Course: 51 year old female resident of [facility].  She has a significant past medical history of COPD, nocturnal oxygen use, hypertension, DVT/PE, hypothyroidism, diabetes mellitus, and CVA with left-sided deficits.  She had UTI 2 weeks back, she was treated with antibiotics and completed a course.  Approximately a week ago she found she get up to go to the bathroom on her own she became profoundly weak.  She was coughing very badly.  Both of her sides hurt from coughing.  She reports nausea, anorexia, diarrhea, burning urination.  She reports ongoing burning urination even after completion of her antibiotic course.  She states her urine is foul-smelling.  She denies fever.  She was brought to the ER because of her weakness.   Pt admitted for sepsis with UTI  Assessment and Plan:  Present on Admission:  UTI with sepsis present on admission -Blood and  urine cultures pending, urine thus far growing ecoli -Of note, patient with ESBL UTI 08/30/2022 -Imipenem initially started. Will plan to transition to PO cipro at time of d/c to complete total 5 days of tx, patient to start cipro on 09/10/22 evening. Patient reports tolerating cipro in past with no issues   Hyponatremia/hypochloremia -normalized with IVF   Possible aspiration pneumonia -Chemical pneumonitis.   CVA/left-sided weakness -Stable, aspirin, atorvastatin, as needed Flexeril resumed   Chronic constipation -Scheduled MiraLAX while in hospital   Diabetes mellitus/peripheral neuropathy -Lantus and Trulicity resumed -Neurontin resumed -continue SSI as needed while in hospital   Hypertension -Metoprolol continued -BP stable and controlled while in hospital with sbp in the low-100's       Consultants:  Procedures performed:   Disposition: Long term care facility Diet recommendation:  Cardiac and Carb modified diet DISCHARGE MEDICATION: Allergies as of 09/10/2022       Reactions   Guaifenesin Anaphylaxis   Kiwi Extract Anaphylaxis, Rash   Levaquin [levofloxacin] Shortness Of Breath, Rash   Strawberry Extract Anaphylaxis, Rash   Lioresal [baclofen] Other (See Comments)   Near syncope/ fall        Medication List     STOP taking these medications    cefdinir 300 MG capsule Commonly known as: OMNICEF   losartan-hydrochlorothiazide 100-25 MG tablet Commonly known as: HYZAAR       TAKE these medications    acetaminophen 500 MG tablet Commonly known as: TYLENOL Take 1,000 mg by mouth in the morning, at noon, and at  bedtime.   albuterol 108 (90 Base) MCG/ACT inhaler Commonly known as: VENTOLIN HFA Inhale 2 puffs into the lungs every 6 (six) hours as needed for wheezing or shortness of breath.   apixaban 5 MG Tabs tablet Commonly known as: Eliquis Take 2 tablets (63m) twice daily for 7 days, then 1 tablet (579m twice daily What changed:  how much  to take how to take this when to take this additional instructions   aspirin 81 MG chewable tablet Chew 81 mg by mouth in the morning.   atorvastatin 80 MG tablet Commonly known as: LIPITOR Take 1 tablet (80 mg total) by mouth daily. What changed: when to take this   B Complex-Biotin-FA Tabs Take 1 tablet by mouth in the morning.   busPIRone 5 MG tablet Commonly known as: BUSPAR Take 5 mg by mouth in the morning and at bedtime.   chlorhexidine 0.12 % solution Commonly known as: PERIDEX Use as directed 15 mLs in the mouth or throat every morning. Rinse and spit after oral care. Do not swallow.   ciprofloxacin 500 MG tablet Commonly known as: CIPRO Take 1 tablet (500 mg total) by mouth 2 (two) times daily.   Cranberry 250 MG Tabs Take 250 mg by mouth at bedtime.   cyclobenzaprine 10 MG tablet Commonly known as: FLEXERIL Take 1 tablet (10 mg total) by mouth 3 (three) times daily as needed for muscle spasms. What changed: when to take this   EPINEPHrinesnap 1 MG/ML Kit Inject 1 mg as directed as needed (allergic reaction).   ergocalciferol 1.25 MG (50000 UT) capsule Commonly known as: VITAMIN D2 Take 50,000 Units by mouth every Thursday.   eucerin cream Apply 1 Application topically See admin instructions. Apply topically once daily and as needed for dry skin.   ezetimibe 10 MG tablet Commonly known as: ZETIA Take 10 mg by mouth at bedtime.   Fish Oil 1000 MG Caps Take 1,000 mg by mouth 3 (three) times daily.   gabapentin 400 MG capsule Commonly known as: NEURONTIN Take 1 capsule (400 mg total) by mouth 3 (three) times daily.   insulin glargine 100 UNIT/ML injection Commonly known as: LANTUS Inject 30 Units into the skin 2 (two) times daily.   insulin lispro 100 UNIT/ML injection Commonly known as: HUMALOG Inject 0-14 Units into the skin See admin instructions. Inject 0-14 units twice daily as per sliding scale : CBG 0-200 : 0 units CBG 201-250 : 2  units CBG 251-300 : 4 units CBG 301-350 : 6 units CBG 351-400 : 8 units CBG 401-450 : 10 units CBG 451-500 : 12 units (if BP reads high, inject 14 units)   ipratropium-albuterol 0.5-2.5 (3) MG/3ML Soln Commonly known as: DUONEB Inhale 1 vial via nebulizer 3 (three) times daily. What changed:  when to take this additional instructions   lansoprazole 30 MG capsule Commonly known as: PREVACID Take 30 mg by mouth every morning.   levothyroxine 75 MCG tablet Commonly known as: SYNTHROID Take 75 mcg by mouth in the morning. (0000)   Melatonin 5 MG Tbdp Take 5 mg by mouth at bedtime.   Menthol (Topical Analgesic) 5 % Gel Apply 1 application  topically in the morning. To neck   metoprolol succinate 50 MG 24 hr tablet Commonly known as: TOPROL-XL Take 50 mg by mouth in the morning.   Nasal Moist Gel Place 1 application  into both nostrils at bedtime.   OXYGEN Inhale 2 L/min into the lungs as needed (for shortness of breath).  Spiriva Respimat 2.5 MCG/ACT Aers Generic drug: Tiotropium Bromide Monohydrate Inhale 2.5 mcg into the lungs in the morning.   Symbicort 80-4.5 MCG/ACT inhaler Generic drug: budesonide-formoterol Inhale 2 puffs into the lungs 2 (two) times daily.   Trulicity 6.25 WL/8.9HT Sopn Generic drug: Dulaglutide Inject 0.75 mg into the skin every Friday.        Follow-up Information     Follow up with PCP in 1-2 weeks Follow up.   Why: Hospital follow up               Discharge Exam: Filed Weights   09/06/22 1906  Weight: 86.6 kg   General exam: Awake, laying in bed, in nad Respiratory system: Normal respiratory effort, no wheezing Cardiovascular system: regular rate, s1, s2 Gastrointestinal system: Soft, nondistended, positive BS Central nervous system: CN2-12 grossly intact, strength intact Extremities: Perfused, no clubbing Skin: Normal skin turgor, no notable skin lesions seen Psychiatry: Mood normal // no visual hallucinations    Condition at discharge: fair  The results of significant diagnostics from this hospitalization (including imaging, microbiology, ancillary and laboratory) are listed below for reference.   Imaging Studies: CT Renal Stone Study  Result Date: 09/07/2022 CLINICAL DATA:  Abdominal pain.  Concern for kidney stone. EXAM: CT ABDOMEN AND PELVIS WITHOUT CONTRAST TECHNIQUE: Multidetector CT imaging of the abdomen and pelvis was performed following the standard protocol without IV contrast. RADIATION DOSE REDUCTION: This exam was performed according to the departmental dose-optimization program which includes automated exposure control, adjustment of the mA and/or kV according to patient size and/or use of iterative reconstruction technique. COMPARISON:  CT abdomen pelvis dated 01/02/2022. FINDINGS: Evaluation of this exam is limited in the absence of intravenous contrast. Lower chest: Faint nodular clusters of ground-glass opacity at the lung bases bilaterally suspicious for developing infiltrate. Aspiration is not excluded. Clinical correlation is recommended. No intra-abdominal free air or free fluid. Hepatobiliary: Fatty liver. No biliary dilatation. The gallbladder is unremarkable. Pancreas: Unremarkable. No pancreatic ductal dilatation or surrounding inflammatory changes. Spleen: Normal in size without focal abnormality. Adrenals/Urinary Tract: The adrenal glands are unremarkable. Moderate right renal parenchyma atrophy and cortical irregularity and scarring. There is no hydronephrosis or nephrolithiasis on either side. The visualized ureters appear unremarkable. The urinary bladder is mildly distended. A small focus of air in the anterior bladder dome may have been introduced via recent inspiration. Correlation with urinalysis recommended to exclude UTI. Stomach/Bowel: There is no bowel obstruction or active inflammation. The appendix is normal. Vascular/Lymphatic: Mild aortoiliac atherosclerotic disease. The  IVC is unremarkable. No portal venous gas. There is no adenopathy. Reproductive: The uterus and ovaries are grossly unremarkable. Other: None Musculoskeletal: No acute or significant osseous findings. IMPRESSION: 1. No hydronephrosis or nephrolithiasis. 2. Fatty liver. 3. No bowel obstruction. Normal appendix. 4. Faint nodular clusters of ground-glass opacity at the lung bases bilaterally suspicious for developing infiltrate. Aspiration is not excluded. 5.  Aortic Atherosclerosis (ICD10-I70.0). Electronically Signed   By: Anner Crete M.D.   On: 09/07/2022 01:17   DG Chest 2 View  Result Date: 09/06/2022 CLINICAL DATA:  Dysuria and flank pain. EXAM: CHEST - 2 VIEW COMPARISON:  August 29, 2022 FINDINGS: The lateral views limited in evaluation secondary to positioning of the patient's upper extremity. The heart size and mediastinal contours are within normal limits. Both lungs are clear. The visualized skeletal structures are unremarkable. IMPRESSION: No active cardiopulmonary disease. Electronically Signed   By: Virgina Norfolk M.D.   On: 09/06/2022 19:36   DG  Chest Portable 1 View  Result Date: 08/29/2022 CLINICAL DATA:  Cough, hypoxia EXAM: PORTABLE CHEST 1 VIEW COMPARISON:  01/01/2022 FINDINGS: Heart and mediastinal contours are within normal limits. No focal opacities or effusions. No acute bony abnormality. IMPRESSION: No active disease. Electronically Signed   By: Rolm Baptise M.D.   On: 08/29/2022 19:18    Microbiology: Results for orders placed or performed during the hospital encounter of 09/06/22  Resp Panel by RT-PCR (Flu A&B, Covid) Anterior Nasal Swab     Status: None   Collection Time: 09/06/22  7:38 PM   Specimen: Anterior Nasal Swab  Result Value Ref Range Status   SARS Coronavirus 2 by RT PCR NEGATIVE NEGATIVE Final    Comment: (NOTE) SARS-CoV-2 target nucleic acids are NOT DETECTED.  The SARS-CoV-2 RNA is generally detectable in upper respiratory specimens during the  acute phase of infection. The lowest concentration of SARS-CoV-2 viral copies this assay can detect is 138 copies/mL. A negative result does not preclude SARS-Cov-2 infection and should not be used as the sole basis for treatment or other patient management decisions. A negative result may occur with  improper specimen collection/handling, submission of specimen other than nasopharyngeal swab, presence of viral mutation(s) within the areas targeted by this assay, and inadequate number of viral copies(<138 copies/mL). A negative result must be combined with clinical observations, patient history, and epidemiological information. The expected result is Negative.  Fact Sheet for Patients:  EntrepreneurPulse.com.au  Fact Sheet for Healthcare Providers:  IncredibleEmployment.be  This test is no t yet approved or cleared by the [place] FDA and  has been authorized for detection and/or diagnosis of SARS-CoV-2 by FDA under an Emergency Use Authorization (EUA). This EUA will remain  in effect (meaning this test can be used) for the duration of the COVID-19 declaration under Section 564(b)(1) of the Act, 21 U.S.C.section 360bbb-3(b)(1), unless the authorization is terminated  or revoked sooner.       Influenza A by PCR NEGATIVE NEGATIVE Final   Influenza B by PCR NEGATIVE NEGATIVE Final    Comment: (NOTE) The Xpert Xpress SARS-CoV-2/FLU/RSV plus assay is intended as an aid in the diagnosis of influenza from Nasopharyngeal swab specimens and should not be used as a sole basis for treatment. Nasal washings and aspirates are unacceptable for Xpert Xpress SARS-CoV-2/FLU/RSV testing.  Fact Sheet for Patients: EntrepreneurPulse.com.au  Fact Sheet for Healthcare Providers: IncredibleEmployment.be  This test is not yet approved or cleared by the [place] FDA and has been authorized for detection and/or  diagnosis of SARS-CoV-2 by FDA under an Emergency Use Authorization (EUA). This EUA will remain in effect (meaning this test can be used) for the duration of the COVID-19 declaration under Section 564(b)(1) of the Act, 21 U.S.C. section 360bbb-3(b)(1), unless the authorization is terminated or revoked.  Performed at [facility], [place] [address], [place], [place] [address]   Urine Culture     Status: Abnormal   Collection Time: 09/06/22 11:21 PM   Specimen: Urine, Clean Catch  Result Value Ref Range Status   Specimen Description   Final    URINE, CLEAN CATCH Performed at [facility], [place] [address] [place] [address] [place], [place] [address]    Special Requests   Final    NONE Performed at [facility], [place] [address], [place], [place] [address]    Culture (A)  Final    >=100,000 COLONIES/mL ESCHERICHIA COLI Confirmed Extended Spectrum Beta-Lactamase Producer (ESBL).  In bloodstream infections from  ESBL organisms, carbapenems are preferred over piperacillin/tazobactam. They are shown to have a lower risk of mortality.    Report Status 09/09/2022 FINAL  Final   Organism ID, Bacteria ESCHERICHIA COLI (A)  Final      Susceptibility   Escherichia coli - MIC*    AMPICILLIN >=32 RESISTANT Resistant     CEFAZOLIN >=64 RESISTANT Resistant     CEFEPIME >=32 RESISTANT Resistant     CEFTRIAXONE >=64 RESISTANT Resistant     CIPROFLOXACIN <=0.25 SENSITIVE Sensitive     GENTAMICIN >=16 RESISTANT Resistant     IMIPENEM <=0.25 SENSITIVE Sensitive     NITROFURANTOIN <=16 SENSITIVE Sensitive     TRIMETH/SULFA >=320 RESISTANT Resistant     AMPICILLIN/SULBACTAM >=32 RESISTANT Resistant     PIP/TAZO <=4 SENSITIVE Sensitive     * >=100,000 COLONIES/mL ESCHERICHIA COLI  Blood culture (routine x 2)     Status: None (Preliminary result)   Collection Time: 09/06/22 11:48 PM   Specimen: BLOOD  Result Value Ref Range Status   Specimen Description   Final     BLOOD BLOOD RIGHT ARM Performed at [facility] [address], [place], [place] [address]    Special Requests   Final    BOTTLES DRAWN AEROBIC AND ANAEROBIC Blood Culture adequate volume Performed at [facility] [address], [place], [place] [address]    Culture   Final    NO GROWTH 3 DAYS Performed at [facility], [place] [address] [place], [place] [address]    Report Status PENDING  Incomplete  Blood culture (routine x 2)     Status: None (Preliminary result)   Collection Time: 09/08/22  5:43 AM   Specimen: BLOOD RIGHT HAND  Result Value Ref Range Status   Specimen Description   Final    BLOOD RIGHT HAND Performed at [facility], [place] [address], [place], [place] [address]    Special Requests   Final    BOTTLES DRAWN AEROBIC ONLY Blood Culture results may not be optimal due to an inadequate volume of blood received in culture bottles Performed at [facility] [address], [place], [place] [address]    Culture   Final    NO GROWTH 2 DAYS Performed at [facility] [address] [place], [place] [address]    Report Status PENDING  Incomplete    Labs: CBC: Recent Labs  Lab 09/06/22 1938 09/08/22 0529 09/09/22 0420 09/10/22 0541  WBC 15.0* 11.0* 7.4 6.0  NEUTROABS 9.8*  --   --   --   HGB 13.1 10.5* 10.2* 10.1*  HCT 42.1 32.8* 32.4* 32.3*  MCV 87.7 87.0 88.3 87.5  PLT 294 196 194 962   Basic Metabolic Panel: Recent Labs  Lab 09/06/22 1938 09/08/22 0529 09/09/22 0420 09/10/22 0541  NA 128* 136 137 139  K 4.9 4.8 4.7 4.2  CL 91* 101 105 103  CO2 _0 GLUCOSE 229* 154* 167* 147*  BUN 22* _1 CREATININE 1.62* 1.40* 1.34* 1.24*  CALCIUM 9.3 8.7* 8.6* 9.0   Liver Function Tests: Recent Labs  Lab 09/08/22 0529 09/09/22 0420 09/10/22 0541  AST _2 ALT _3 ALKPHOS 65 58 60  BILITOT 0.6 0.7 0.3  PROT 6.7 6.2* 6.7  ALBUMIN 2.5* 2.5* 2.6*    CBG: Recent Labs  Lab 09/09/22 0835 09/09/22 1128 09/09/22 1613 09/09/22 2131 09/10/22 0737  GLUCAP 155* 160* 222* 188* 122*    Discharge time spent: less than 30 minutes.  Signed: Marylu Lund, MD [facility] 09/10/2022

## 2022-09-12 LAB — CULTURE, BLOOD (ROUTINE X 2)
Culture: NO GROWTH
Special Requests: ADEQUATE

## 2022-09-13 LAB — CULTURE, BLOOD (ROUTINE X 2): Culture: NO GROWTH

## 2022-09-19 ENCOUNTER — Ambulatory Visit: Payer: Medicaid Other | Admitting: Neurology

## 2022-11-07 ENCOUNTER — Encounter: Payer: Self-pay | Admitting: Neurology

## 2022-11-07 ENCOUNTER — Ambulatory Visit: Payer: Medicaid Other | Admitting: Neurology

## 2022-11-07 VITALS — BP 171/96 | HR 88

## 2022-11-07 DIAGNOSIS — I69354 Hemiplegia and hemiparesis following cerebral infarction affecting left non-dominant side: Secondary | ICD-10-CM

## 2022-11-07 DIAGNOSIS — Z8673 Personal history of transient ischemic attack (TIA), and cerebral infarction without residual deficits: Secondary | ICD-10-CM | POA: Diagnosis not present

## 2022-11-07 NOTE — Patient Instructions (Addendum)
Great to see you today! Continue current medications aspirin 81 mg daily, Eliquis Work on management of vascular risk factors Goals: BP < 130/90, A1C < 7.0, LDL < 70 Really need to work on management of diabetes may benefit from seeing endocrinology  Great job on stopping smoking!! I will place referral for Vivist[facility] again, will update as I hear, I placed the referral for OT at neurorehab

## 2022-11-07 NOTE — Progress Notes (Addendum)
Patient: Karla Hoover Date of Birth: [birth year]  Reason for Visit: Follow up History from: Patient Primary Neurologist: Karla Hoover  ASSESSMENT AND PLAN 52 y.o. 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 Vascular risk factors of diabetes, hypertension, hyperlipidemia, obesity, smoking and significant Intracranial and extracranial atherosclerosis   -Patient is mostly interested in being considered for the Vivistim program, I will place a referral to Neurorehab OT for evaluation. I discussed with Karla Hoover and this is the recommended steps -We discussed the importance of management of vascular risk factors with a goal of BP less than 130/90, A1c less than 7.0, LDL less than 70 -We discussed considering referral to endocrinology as most recent A1c was 10.5 in December 2023 despite insulin -I commended her on smoking cessation! -Continue current medications to include aspirin 81 mg daily for secondary stroke prevention and Eliquis for history of pulmonary embolism -Return here as needed   Addendum 11/19/22: Patient was evaluated today by OT for Vivistim program, did not qualify based upon her limited active movement particularly in her hand and wrist as well as spasticity. Also some, cognitive deficits would be an issue with vivistim treatment. Felt she could still benefit from traditional OT at her SNF. I will send my note with this addendum to facility with an order for OT.   HISTORY OF PRESENT ILLNESS: Today 11/07/22 Here today alone, we called her mother who joined up by telephone (she is 62). Living at [facility]. Hospitalized in December with sepsis with UTI. Holds on to the wall when walking or is in wheelchair, has walker. Wants to use her legs as much as possible. Dresses herself, pushes herself in her wheelchair. Limited movement of left  arm. Remains on aspirin and Eliquis. Didn't take her medications yet, BP was 171/96. She stopped smoking for the last month. Has really cut back on soda. Labs in Dec sowed A1C 10.5. she gets insulin. Claims her initial stroke caused by elevated blood sugar. PT is on and off. She does her own rehab. Is active at the facility with social games.  Is interested in consideration of Vivistim program.  Referral was placed at last visit, but looking at the notes since hospitalized reportedly undergoing therapy not a candidate.  HISTORY Dr. Leonie Hoover 08/07/22 HPI: Karla Hoover is a 52 year old Caucasian lady seen today for initial office consultation visit.  She is accompanied by her mother and caregiver from skilled nursing facility where she is staying.  History is obtained from them and review of electronic medical records in care everywhere.  No actual brain imaging films available for review today.  She has a past medical history of diabetes, hypertension, hyperlipidemia, obesity, hypothyroidism, COPD chronic kidney disease.  She developed a large right middle cerebral artery stroke in August 2020 while living in [place].  He was found to have complete right ICA occlusion.  He was airlifted and transferred to [facility] in [place] for emergent thrombectomy and subsequently went to inpatient rehab for prolonged..  Had persistent left hemiparesis and spasticity changes over time.  He spent a few more months and skilled nursing facility and subsequently went home  to the [place] with her family in November 2020 09 October 2019 when she developed sudden onset of shortness of breath and chest pain and was diagnosed with pulmonary embolism started on Eliquis which she has continued  on.  She has not had any recurrent further stroke or TIA symptoms.  She is able to ambulate with a hemiwalker but has persistent spastic left hemiplegia with very little movement in the left hand and fingers and left foot drop.   She has had no recent falls or injuries.  Patient continues to smoke despite being counseled to quit smoking.  He is tolerating Eliquis well without bruising or bleeding.  He is also on Lipitor 80 mg tolerating well without any aches or pains.  I do not have any recent lab to look at.  She has no new complaints.  Imaging results available from MRI scan of the brain at [facility] in [place] on 06/07/2020 showed old right basal ganglia and right corona radiator infarct with ex vacuo dilatation of the right lateral ventricle and generalized atrophy.  CT angiogram of the brain and neck on 06/06/2020 showed right ICA occlusion in the neck with mild left ICA stenosis.  Severe left vertebral artery origin stenosis with diminutive caliber of the left vertebral artery.  50% stenosis of the left subclavian artery.     REVIEW OF SYSTEMS: Out of a complete 14 system review of symptoms, the patient complains only of the following symptoms, and all other reviewed systems are negative.  See HPI  ALLERGIES: Allergies  Allergen Reactions   Guaifenesin Anaphylaxis   Kiwi Extract Anaphylaxis and Rash   Levaquin [Levofloxacin] Shortness Of Breath and Rash   Strawberry Extract Anaphylaxis and Rash   Lioresal [Baclofen] Other (See Comments)    Near syncope/ fall    HOME MEDICATIONS: Outpatient Medications Prior to Visit  Medication Sig Dispense Refill   acetaminophen (TYLENOL) 500 MG tablet Take 1,000 mg by mouth in the morning, at noon, and at bedtime.     albuterol (VENTOLIN HFA) 108 (90 Base) MCG/ACT inhaler Inhale 2 puffs into the lungs every 6 (six) hours as needed for wheezing or shortness of breath. 8 g 1   apixaban (ELIQUIS) 5 MG TABS tablet Take 1 tablet (74m) twice daily 60 tablet 0   apixaban (ELIQUIS) 5 MG TABS tablet Take 5 mg by mouth 2 (two) times daily.     aspirin 81 MG chewable tablet Chew 81 mg by mouth in the morning.     atorvastatin (LIPITOR) 80 MG tablet Take 1 tablet (80 mg total)  by mouth daily. (Patient taking differently: Take 80 mg by mouth at bedtime.) 30 tablet 5   B Complex-Biotin-FA TABS Take 1 tablet by mouth in the morning.     busPIRone (BUSPAR) 5 MG tablet Take 5 mg by mouth in the morning and at bedtime.     chlorhexidine (PERIDEX) 0.12 % solution Use as directed 15 mLs in the mouth or throat every morning. Rinse and spit after oral care. Do not swallow.     ciprofloxacin (CIPRO) 500 MG tablet Take 1 tablet (500 mg total) by mouth 2 (two) times daily. 3 tablet 0   Cranberry 250 MG TABS Take 250 mg by mouth at bedtime.     cyclobenzaprine (FLEXERIL) 10 MG tablet Take 1 tablet (10 mg total) by mouth 3 (three) times daily as needed for muscle spasms. (Patient taking differently: Take 10 mg by mouth every 8 (eight) hours as needed for muscle spasms.) 30 tablet 5   Dulaglutide (TRULICITY) [id number] Inject 0.75 mg into the skin every Friday.     EPINEPHrinesnap 1 MG/ML KIT Inject 1 mg as directed as needed (allergic reaction).  ergocalciferol (VITAMIN D2) 1.25 MG (50000 UT) capsule Take 50,000 Units by mouth every Thursday.     ezetimibe (ZETIA) 10 MG tablet Take 10 mg by mouth at bedtime.     gabapentin (NEURONTIN) 400 MG capsule Take 1 capsule (400 mg total) by mouth 3 (three) times daily. 5 capsule 0   insulin glargine (LANTUS) 100 UNIT/ML injection Inject 30 Units into the skin 2 (two) times daily.     insulin lispro (HUMALOG) 100 UNIT/ML injection Inject 0-14 Units into the skin See admin instructions. Inject 0-14 units twice daily as per sliding scale : CBG 0-200 : 0 units CBG 201-250 : 2 units CBG 251-300 : 4 units CBG 301-350 : 6 units CBG 351-400 : 8 units CBG 401-450 : 10 units CBG 451-500 : 12 units (if BP reads high, inject 14 units)     ipratropium-albuterol (DUONEB) 0.5-2.5 (3) MG/3ML SOLN Inhale 1 vial via nebulizer 3 (three) times daily. (Patient taking differently: Take 3 mLs by nebulization See admin instructions. 2 entries on MAR :   1 vial via nebulizer three times daily + 1 vial every 8 hours as needed for shortness of breath, wheezing.) 360 mL 0   lansoprazole (PREVACID) 30 MG capsule Take 30 mg by mouth every morning.     levothyroxine (SYNTHROID) 75 MCG tablet Take 75 mcg by mouth in the morning. (0000)     Melatonin 5 MG TBDP Take 5 mg by mouth at bedtime.     Menthol, Topical Analgesic, 5 % GEL Apply 1 application  topically in the morning. To neck     metoprolol succinate (TOPROL-XL) 50 MG 24 hr tablet Take 50 mg by mouth in the morning.     Omega-3 Fatty Acids (FISH OIL) 1000 MG CAPS Take 1,000 mg by mouth 3 (three) times daily.     OXYGEN Inhale 2 L/min into the lungs as needed (for shortness of breath).     Propyl Glycol-Hydroxyethylcell (NASAL MOIST) GEL Place 1 application  into both nostrils at bedtime.     Skin Protectants, Misc. (EUCERIN) cream Apply 1 Application topically See admin instructions. Apply topically once daily and as needed for dry skin.     SYMBICORT 80-4.5 MCG/ACT inhaler Inhale 2 puffs into the lungs 2 (two) times daily.     Tiotropium Bromide Monohydrate (SPIRIVA RESPIMAT) 2.5 MCG/ACT AERS Inhale 2.5 mcg into the lungs in the morning.     No facility-administered medications prior to visit.    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   Former smoker 36 pack year smoking history     Diabetes mellitus without complication ([place])    Essential hypertension 05/04/2009   Qualifier: Diagnosis of  By: Quentin Cornwall CMA, Jessica     GERD without esophagitis 02/09/2021   Grade I diastolic dysfunction [id number]   Hypertension    Hypothyroidism 02/09/2021   Mixed diabetic hyperlipidemia associated with type 2  diabetes mellitus ([place]) 02/09/2021   Pulmonary embolism ([place]) 11/16/2019   Stroke ([place])    Type 2 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 disease, with long-term current use of insulin ([place]) 02/09/2021    PAST SURGICAL HISTORY: History reviewed. No pertinent surgical history.  FAMILY HISTORY: Family History  Problem  Relation Age of Onset   Diabetes Mother    COPD Sister    Heart failure Sister     SOCIAL HISTORY: Social History   Socioeconomic History   Marital status: Single    Spouse name: Not on file   Number of children: Not on file   Years of education: Not on file   Highest education level: Not on file  Occupational History   Not on file  Tobacco Use   Smoking status: Former    Packs/day: 0.50    Years: 36.00    Total pack years: 18.00    Types: Cigarettes    Start date: 10/08/1983    Quit date: 11/22/2019    Years since quitting: 2.9   Smokeless tobacco: Never  Vaping Use   Vaping Use: Never used  Substance and Sexual Activity   Alcohol use: No   Drug use: No   Sexual activity: Not on file  Other Topics Concern   Not on file  Social History Narrative   Not on file   Social Determinants of Health   Financial Resource Strain: Not on file  Food Insecurity: No Food Insecurity (09/07/2022)   Hunger Vital Sign    Worried About Running Out of Food in the Last Year: Never true    Ran Out of Food in the Last Year: Never true  Transportation Needs: No Transportation Needs (09/07/2022)   PRAPARE - Hydrologist (Medical): No    Lack of Transportation (Non-Medical): No  Physical Activity: Not on file  Stress: Not on file  Social Connections: Not on file  Intimate Partner Violence: Not At Risk (09/07/2022)   Humiliation, Afraid, Rape, and Kick questionnaire    Fear of Current or Ex-Partner: No    Emotionally Abused: No    Physically Abused: No    Sexually Abused: No   PHYSICAL EXAM  Vitals:   11/07/22 0857  BP: (!) 171/96   Pulse: 88   There is no height or weight on file to calculate BMI.  Generalized: Well developed, in no acute distress, seated in wheelchair Neurological examination  Mentation: Alert oriented to time, place, history taking. Follows all commands speech and language fluent Cranial nerve II-XII: Pupils were equal round reactive to light. Extraocular movements were full, visual field were full on confrontational test. Facial sensation and strength were normal. Head turning and shoulder shrug  were normal and symmetric. Motor: Normal strength to the right side.  Spastic left hemiparesis 3/5 left upper extremity, increased tone to the hand, 4/5 left lower extremity, left foot drop Sensory: Sensory testing is intact to soft touch on all 4 extremities. No evidence of extinction is noted.  Coordination: Cerebellar testing reveals good finger-nose-finger and heel-to-shin bilaterally.  Gait and station: Can stand from chair and dependently, gastric type gait with the left, circumduction type Reflexes: Deep tendon reflexes are symmetric but increased to the left side  DIAGNOSTIC DATA (LABS, IMAGING, TESTING) - I reviewed patient records, labs, notes, testing and imaging myself where available.  Lab Results  Component Value Date   WBC 6.0 09/10/2022   HGB 10.1 (L) 09/10/2022   HCT 32.3 (L) 09/10/2022   MCV 87.5 09/10/2022   PLT 204 09/10/2022      Component Value Date/Time   NA 139 09/10/2022 0541   NA 132 (L) 12/10/2012 0626   K 4.2 09/10/2022 0541   K 4.3 12/10/2012 0626   CL 103 09/10/2022 0541   CL 99 12/10/2012  0626   CO2 29 09/10/2022 0541   CO2 24 12/10/2012 0626   GLUCOSE 147 (H) 09/10/2022 0541   GLUCOSE 426 (H) 12/10/2012 0626   BUN 16 09/10/2022 0541   BUN 11 12/10/2012 0626   CREATININE 1.24 (H) 09/10/2022 0541   CREATININE 1.01 12/10/2012 0626   CALCIUM 9.0 09/10/2022 0541   CALCIUM 8.6 12/10/2012 0626   PROT 6.7 09/10/2022 0541   PROT 7.2 12/10/2012 0626   ALBUMIN 2.6  (L) 09/10/2022 0541   ALBUMIN 3.2 (L) 12/10/2012 0626   AST 27 09/10/2022 0541   AST 8 (L) 12/10/2012 0626   ALT 18 09/10/2022 0541   ALT 23 12/10/2012 0626   ALKPHOS 60 09/10/2022 0541   ALKPHOS 119 12/10/2012 0626   BILITOT 0.3 09/10/2022 0541   BILITOT 0.2 12/10/2012 0626   GFRNONAA 53 (L) 09/10/2022 0541   GFRNONAA >60 12/10/2012 0626   GFRAA >60 07/08/2020 0400   GFRAA >60 12/10/2012 0626   Lab Results  Component Value Date   CHOL 162 05/30/2021   HDL 28 (L) 05/30/2021   LDLCALC 74 05/30/2021   TRIG 300 (H) 05/30/2021   CHOLHDL 5.8 05/30/2021   Lab Results  Component Value Date   HGBA1C 10.5 (H) 09/07/2022   No results found for: "VITAMINB12" Lab Results  Component Value Date   TSH 1.323 01/02/2022   Butler Denmark, AGNP-C, DNP 11/07/2022, 9:37 AM [facility] [address] [place], [place] [address] [phone]

## 2022-11-11 NOTE — Therapy (Incomplete)
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   Former smoker 36 pack year smoking history     Diabetes mellitus without complication ([place])    Essential hypertension 05/04/2009   Qualifier: Diagnosis of  By: Quentin Cornwall CMA, Jessica     GERD without esophagitis 02/09/2021   Grade I diastolic dysfunction 69/67/8938   Hypertension    Hypothyroidism 02/09/2021   Mixed 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 ([place]) 02/09/2021   Pulmonary embolism ([place]) 11/16/2019   Stroke ([place])    Type 2 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 disease, with long-term current use of insulin ([place]) 02/09/2021   No past surgical history on file. Patient Active Problem List   Diagnosis Date Noted   COPD (chronic obstructive pulmonary disease) ([place]) 09/07/2022   Sepsis ([place]) 09/07/2022   AKI (acute kidney injury) ([place]) 08/30/2022   History of pulmonary embolism 08/30/2022   Hemiparesis affecting left side as late effect of cerebrovascular accident (CVA) ([place]) 08/30/2022   Allergies    Grade I diastolic dysfunction 07/23/5101   Mild protein-calorie malnutrition ([place]) 12/02/2021   COPD with acute exacerbation ([place]) 12/02/2021   Acute renal failure superimposed on stage 3a chronic kidney disease ([place]) 02/09/2021   Chronic respiratory failure with hypoxia ([place]) 02/09/2021   History of thromboembolism 02/09/2021   Type 2 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 disease, with long-term current use of  insulin ([place]) 02/09/2021   Mixed 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 ([place]) 02/09/2021   GERD without esophagitis 02/09/2021   Hypothyroidism 02/09/2021   Lymphadenopathy 12/28/2020   VTE (venous thromboembolism) 12/01/2019   Overweight (BMI 25.0-29.9) 11/22/2019   Chronic constipation 11/22/2019   Tobacco abuse 11/16/2019   DM (diabetes mellitus), type 2 ([place]) 11/15/2019   History of stroke 11/15/2019   Essential hypertension 05/04/2009   COPD with chronic bronchitis ([place]) 05/04/2009    Rationale for Evaluation and Treatment Rehabilitation  ONSET DATE: referral date 11/07/22  REFERRING DIAG:  Diagnosis  I69.354 (ICD-10-CM) - Spastic hemiplegia of left nondominant side as late effect of cerebral infarction ([place])    THERAPY DIAG:  No diagnosis found.  SUBJECTIVE:   SUBJECTIVE STATEMENT: *** Pt accompanied by: {accompnied:27141}  PERTINENT HISTORY: Per Dr. Clydene Fake note 08/07/22 She has a past medical history of diabetes, hypertension, hyperlipidemia, obesity, hypothyroidism, COPD chronic kidney disease.  She developed a large right middle cerebral artery stroke in August 2020 while living in [place].  He was found to have complete right ICA occlusion.  He was airlifted and transferred to [facility] in [place] for emergent thrombectomy    Imaging results available from MRI scan of the brain at [facility] in [place] on 06/07/2020 showed old right basal ganglia and right corona radiator infarct with ex vacuo dilatation of the right lateral ventricle and generalized atrophy.  CT angiogram of the brain and neck on 06/06/2020 showed right ICA occlusion in the neck with mild left ICA stenosis.  Severe left vertebral artery origin stenosis with diminutive caliber of the left vertebral  artery.  50% stenosis of the left subclavian artery.        PRECAUTIONS: {Therapy precautions:24002}  WEIGHT BEARING RESTRICTIONS {Yes  ***/No:24003}  PAIN:  Are you having pain? {OPRCPAIN:27236}  FALLS: Has patient fallen in last 6 months? {fallsyesno:27318}  LIVING ENVIRONMENT: Lives with: {OPRC lives with:25569::"lives with their family"} Lives in: {Lives in:25570} Stairs: {opstairs:27293} Has following equipment at home: {Assistive devices:23999}  PLOF: {PLOF:24004}  PATIENT GOALS ***  OBJECTIVE:   HAND DOMINANCE: {MISC; OT HAND DOMINANCE:[phone]}  SENSATION: {sensation:27233}  MUSCLE TONE: {UETONE:25567}  COGNITION: Overall cognitive status: {cognition:24006}  PERCEPTION: {Perception:25564}  PRAXIS: {Praxis:25565}   PATIENT EDUCATION: Education details: *** Person educated: {Person educated:25204} Education method: {Education Method:25205} Education comprehension: {Education Comprehension:25206}  --------------------------------------------------------------------------------------------------------------------- Bubba Camp: IADL Scale  A. Ability to Use Telephone  {Law[place]-Brody Ability to Use Telephone:27247}  B. Shopping {Lawton-Brody Shopping:27248}  C. Food Preparation {Lawton-Brody Food Preparation:27249}  D. Housekeeping {Lawton-Brody Housekeeping:27250}  E. Laundry  {Law[place]-Brody Laundry:27251}  F. Mode of Transportation  {[place]-Brody Transportation:27252}  G. Responsibility for Own Medications {Law[place]-Brody Medication:27253}  H. Ability to Handle Finances {Lawton-Brody Finance:27254}   TOTAL LAWTON BRODY SCORE: ***  -----------------------------------------------------------  Ron Parker Index of Independence in ADL INDEPENDENCE (1) DEPENDENCE (0)  NO supervision, direction or personal assistance WITH supervision, direction, personal assistance or total care   ACTIVITIES 1 or 0  Bathing   Dressing   Toileting   Transferring   Continence   Feeding     KATZ TOTAL: ***  -----------------------------------------------------------  FUGL-MEYER ASSESSMENT    A. Upper Extremity  I. Reflex Activity: Flexors (elbow) ***  Extensors (triceps) ***  Total ***/4     II. Volitional Movement within Synergies: Flexor Synergy:   Shoulder Retraction ***  Shoulder Elevation ***  Shoulder Abduction ***  External Rotation ***  Elbow Flexion ***  Forearm Supination ***  Extensor Synergy:   Shoulder Adduction/Internal Rotation ***  Elbow Extension ***  Forearm Pronation ***  Total ***/18   III. Volitional Mixing of Synergies: Hand to Lumbar Spine ***  Shoulder Flexion ***  Pronation/Supination ***  Total ***/6   IV. Volitional Movement with Little to No Synergy Shoulder Abduction ***  Shoulder Flexion 90+ ***  Pronation/Supination ***  Total ***/6    B. Wrist Stability of Wrist at 15* ***  Repeated Flex/Ext ***  Circumduction ***  Stability at 15*Wrist, 0*Elbow ***  Flexion/Extension - Elbow 0* ***  Total ***/10   C. Hand Mass Flexion ***  Mass Extension ***  Total ***/4    D. Grasp Hook  ***  Thumb Adduction - paper ***  Pincer - pen ***  Cylindrical - cup ***  Spherical - ball ***  Total ***/10   E. Coordination/Speed Tremor ***  Dysmetria ***  Time ***  Total ***/6    FUGL-MEYER ASSESSMENT: UPPER EXTREMITY A-E TOTAL: ***/64    ---------------------------------------------------------------------------------------------------------------------    ASSESSMENT:  CLINICAL IMPRESSION: Patient is a *** yr old *** referred to OT for assessment to determine qualification for implantable nerve stimulator to address {upper extremity:27334} functioning.  Completed Fugl Meyer UE assessment, as well as Ron Parker index of Independence in ADL, and Bubba Camp IADL Scale.  Patient has adequate {vivistim adequate:27351} throughout {upper extremity:27334} with limitations in {vivistim limitations:27352}.  Patient has excellent family support and has been involved in intense rehab programs in the past.  Patient is motivated to  improve functional use and movement in her {upper extremity:27334} arm.  PERFORMANCE DEFICITS in functional skills including {OT physical skills:25468}, cognitive skills including {OT  cognitive skills:25469}, and psychosocial skills including {OT psychosocial skills:25470}.   IMPAIRMENTS are limiting patient from {OT performance deficits:25471}.   COMORBIDITIES {Comorbidities:25485} that affects occupational performance. Patient will benefit from skilled OT to address above impairments and improve overall function.  MODIFICATION OR ASSISTANCE TO COMPLETE EVALUATION: {OT modification:25474}  OT OCCUPATIONAL PROFILE AND HISTORY: {OT PROFILE AND HISTORY:25484}  CLINICAL DECISION MAKING: {OT CDM:25475}  REHAB POTENTIAL: {rehabpotential:25112}  EVALUATION COMPLEXITY: {Evaluation complexity:25115}  PLAN:  Will determine appropriateness of candidacy for Vivistim and reevaluate and develop plan upon return if determined appropriate.    Sicily Zaragoza, OT 11/11/2022, 4:53 PM

## 2022-11-11 NOTE — Therapy (Incomplete)
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   Former smoker 36 pack year smoking history     Diabetes mellitus without complication ([place])    Essential hypertension 05/04/2009   Qualifier: Diagnosis of  By: Quentin Cornwall CMA, Jessica     GERD without esophagitis 02/09/2021   Grade I diastolic dysfunction 40/98/1191   Hypertension    Hypothyroidism 02/09/2021   Mixed 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 ([place]) 02/09/2021   Pulmonary embolism ([place]) 11/16/2019   Stroke ([place])    Type 2 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 disease, with long-term current use of insulin ([place]) 02/09/2021   No past surgical history on file. Patient Active Problem List   Diagnosis Date Noted   COPD (chronic obstructive pulmonary disease) ([place]) 09/07/2022   Sepsis ([place]) 09/07/2022   AKI (acute kidney injury) ([place]) 08/30/2022   History of pulmonary embolism 08/30/2022   Hemiparesis affecting left side as late effect of cerebrovascular accident (CVA) ([place]) 08/30/2022   Allergies    Grade I diastolic dysfunction 47/82/9562   Mild protein-calorie malnutrition ([place]) 12/02/2021   COPD with acute exacerbation ([place]) 12/02/2021   Acute renal failure superimposed on stage 3a chronic kidney disease ([place]) 02/09/2021   Chronic respiratory failure with hypoxia ([place]) 02/09/2021   History of thromboembolism 02/09/2021   Type 2 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 disease, with long-term current use  of insulin ([place]) 02/09/2021   Mixed 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 ([place]) 02/09/2021   GERD without esophagitis 02/09/2021   Hypothyroidism 02/09/2021   Lymphadenopathy 12/28/2020   VTE (venous thromboembolism) 12/01/2019   Overweight (BMI 25.0-29.9) 11/22/2019   Chronic constipation 11/22/2019   Tobacco abuse 11/16/2019   DM (diabetes mellitus), type 2 ([place]) 11/15/2019   History of stroke 11/15/2019   Essential hypertension 05/04/2009   COPD with chronic bronchitis ([place]) 05/04/2009    ONSET DATE: 11/07/2022  REFERRING DIAG: Diagnosis I69.354 (ICD-10-CM) - Spastic hemiplegia of left nondominant side as late effect of cerebral infarction ([place]) Note:  For consideration of vivstim  THERAPY DIAG:  No diagnosis found.  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  diabetes, hypertension, hyperlipidemia, obesity, smoking and significant Intracranial and extracranial atherosclerosis  Patient is mostly interested in being considered for the Vivistim program, I will place a referral to Neurorehab OT for evaluation. I discussed with [name] and this is the recommended steps     PRECAUTIONS: {Therapy precautions:24002}  WEIGHT BEARING RESTRICTIONS: {Yes ***/No:24003}  PAIN:  Are you having pain? {OPRCPAIN:27236}  FALLS: Has patient fallen in last 6 months? {fallsyesno:27318}  LIVING ENVIRONMENT: Lives with: {OPRC lives with:25569::"lives with their family"} Lives in: {Lives in:25570} Stairs: {opstairs:27293} Has following equipment at home: {Assistive devices:23999}  PLOF: {PLOF:24004}  PATIENT GOALS: ***  OBJECTIVE:   HAND DOMINANCE: {MISC; OT HAND DOMINANCE:[phone]}  ADLs:  Overall ADLs:  *** Transfers/ambulation related to ADLs: Eating: *** Grooming: *** UB Dressing: *** LB Dressing: *** Toileting: *** Bathing: *** Tub Shower transfers: *** Equipment: {equipment:25573}  IADLs: Shopping: *** Light housekeeping: *** Meal Prep: *** Community mobility: *** Medication management: *** Financial management: *** Handwriting: {OTWRITTENEXPRESSION:25361}  MOBILITY STATUS: {OTMOBILITY:25360}  POSTURE COMMENTS:  {posture:25561} Sitting balance: {sitting balance:25483}  ACTIVITY TOLERANCE: Activity tolerance: ***  FUNCTIONAL OUTCOME MEASURES: {OTFUNCTIONALMEASURES:27238}  UPPER EXTREMITY ROM:    {AROM/PROM:27142} ROM Right eval Left eval  Shoulder flexion    Shoulder abduction    Shoulder adduction    Shoulder extension    Shoulder internal rotation    Shoulder external rotation    Elbow flexion    Elbow extension    Wrist flexion    Wrist extension    Wrist ulnar deviation    Wrist radial deviation    Wrist pronation    Wrist supination    (Blank rows = not tested)  UPPER EXTREMITY MMT:     MMT Right eval Left eval  Shoulder flexion    Shoulder abduction    Shoulder adduction    Shoulder extension    Shoulder internal rotation    Shoulder external rotation    Middle trapezius    Lower trapezius    Elbow flexion    Elbow extension    Wrist flexion    Wrist extension    Wrist ulnar deviation    Wrist radial deviation    Wrist pronation    Wrist supination    (Blank rows = not tested)  HAND FUNCTION: {handfunction:27230}  COORDINATION: {otcoordination:27237}  SENSATION: {sensation:27233}  EDEMA: ***  MUSCLE TONE: {UETONE:25567}  COGNITION: Overall cognitive status: {cognition:24006}  VISION: Subjective report: *** Baseline vision: {OTBASELINEVISION:25363} Visual history: {OTVISUALHISTORY:25364}  VISION ASSESSMENT: {visionassessment:27231}  Patient has difficulty with following activities due to following visual  impairments: ***  PERCEPTION: {Perception:25564}  PRAXIS: {Praxis:25565}  OBSERVATIONS: ***   TODAY'S TREATMENT:                                                                                                                              DATE: ***   PATIENT EDUCATION: Education details: *** Person educated: {Person educated:25204} Education method: {Education Method:25205} Education comprehension: {Education Comprehension:25206}  HOME EXERCISE PROGRAM: ***   GOALS: Goals reviewed with patient? {yes/no:20286}  SHORT TERM GOALS: Target date: ***  *** Baseline: Goal status: {GOALSTATUS:25110}  2.  *** Baseline:  Goal status: {GOALSTATUS:25110}  3.  *** Baseline:  Goal status: {GOALSTATUS:25110}  4.  *** Baseline:  Goal status: {GOALSTATUS:25110}  5.  *** Baseline:  Goal status: {GOALSTATUS:25110}  6.  *** Baseline:  Goal status: {GOALSTATUS:25110}  LONG TERM GOALS: Target date: ***  *** Baseline:  Goal status: {GOALSTATUS:25110}  2.  *** Baseline:  Goal status: {GOALSTATUS:25110}  3.  *** Baseline:  Goal status: {GOALSTATUS:25110}  4.  *** Baseline:  Goal status: {GOALSTATUS:25110}  5.  *** Baseline:  Goal status: {  GOALSTATUS:25110}  6.  *** Baseline:  Goal status: {GOALSTATUS:25110}  ASSESSMENT:  CLINICAL IMPRESSION: Patient is a *** y.o. *** who was seen today for occupational therapy evaluation for ***.   PERFORMANCE DEFICITS: in functional skills including {OT physical skills:25468}, cognitive skills including {OT cognitive skills:25469}, and psychosocial skills including {OT psychosocial skills:25470}.   IMPAIRMENTS: are limiting patient from {OT performance deficits:25471}.   CO-MORBIDITIES: {Comorbidities:25485} that affects occupational performance. Patient will benefit from skilled OT to address above impairments and improve overall function.  MODIFICATION OR ASSISTANCE TO COMPLETE EVALUATION: {OT modification:25474}  OT  OCCUPATIONAL PROFILE AND HISTORY: {OT PROFILE AND HISTORY:25484}  CLINICAL DECISION MAKING: {OT CDM:25475}  REHAB POTENTIAL: {rehabpotential:25112}  EVALUATION COMPLEXITY: {Evaluation complexity:25115}    PLAN:  OT FREQUENCY: {rehab frequency:25116}  OT DURATION: {rehab duration:25117}  PLANNED INTERVENTIONS: {OT Interventions:25467}  RECOMMENDED OTHER SERVICES: ***  CONSULTED AND AGREED WITH PLAN OF CARE: {ERX:54008}  PLAN FOR NEXT SESSION: ***   Hans Eden, OT 11/11/2022, 12:39 PM

## 2022-11-12 ENCOUNTER — Encounter: Payer: Medicaid Other | Admitting: Occupational Therapy

## 2022-11-12 NOTE — Therapy (Signed)
OUTPATIENT OCCUPATIONAL THERAPY VIVISTIM EVALUATION  Patient Name: Karla Hoover MRN: [id number] DOB:07-Jun-1971, 52 y.o., female Today's Date: 11/19/2022  PCP: Karla Hoover, No REFERRING PROVIDER: Suzzanne Cloud, NP   OT End of Session - 11/19/22 0857     Visit Number 1    Number of Visits 1    Authorization Type Medicaid    OT Start Time 0804    OT Stop Time 0840    OT Time Calculation (min) 36 min             Past Medical History:  Diagnosis Date   Asthma    Bilateral pulmonary embolism ([place]) 11/15/2019   Chronic kidney disease, stage 3a ([place]) 02/09/2021   Chronic respiratory failure with hypoxia ([place]) 02/09/2021   COPD (chronic obstructive pulmonary disease) ([place])    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   Former smoker 36 pack year smoking history     Diabetes mellitus without complication ([place])    Essential hypertension 05/04/2009   Qualifier: Diagnosis of  By: Quentin Cornwall CMA, Jessica     GERD without esophagitis 02/09/2021   Grade I diastolic dysfunction [id number]   Hypertension    Hypothyroidism 02/09/2021   Mixed 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 ([place]) 02/09/2021   Pulmonary embolism ([place]) 11/16/2019   Stroke ([place])    Type 2 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 disease, with long-term current use of insulin ([place]) 02/09/2021   No past surgical history on file. Patient Active Problem List   Diagnosis Date Noted   COPD (chronic obstructive pulmonary disease) ([place]) 09/07/2022   Sepsis ([place]) 09/07/2022   AKI (acute kidney injury) ([place]) 08/30/2022   History of pulmonary embolism 08/30/2022   Hemiparesis affecting left side as late effect of cerebrovascular accident (CVA) ([place]) 08/30/2022   Allergies    Grade I diastolic dysfunction [id number]   Mild protein-calorie malnutrition ([place]) 12/02/2021   COPD with acute exacerbation ([place]) 12/02/2021   Acute renal failure superimposed on stage 3a chronic kidney disease ([place])  02/09/2021   Chronic respiratory failure with hypoxia ([place]) 02/09/2021   History of thromboembolism 02/09/2021   Type 2 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 disease, with long-term current use of insulin ([place]) 02/09/2021   Mixed 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 ([place]) 02/09/2021   GERD without esophagitis 02/09/2021   Hypothyroidism 02/09/2021   Lymphadenopathy 12/28/2020   VTE (venous thromboembolism) 12/01/2019   Overweight (BMI 25.0-29.9) 11/22/2019   Chronic constipation 11/22/2019   Tobacco abuse 11/16/2019   DM (diabetes mellitus), type 2 ([place]) 11/15/2019   History of stroke 11/15/2019   Essential hypertension 05/04/2009   COPD with chronic bronchitis ([place]) 05/04/2009    Rationale for Evaluation and Treatment Rehabilitation  ONSET DATE: 11/07/22  REFERRING DIAG: [id number] (ICD-10-CM) - Spastic hemiplegia of left nondominant side as late effect of cerebral infarction ([place])   THERAPY DIAG:  Muscle weakness (generalized) - Plan: Ot plan of care cert/re-cert  Other lack of coordination - Plan: Ot plan of care cert/re-cert  Hemiplegia and hemiparesis following cerebral infarction affecting left non-dominant side ([place]) - Plan: Ot plan of care cert/re-cert  SUBJECTIVE:   SUBJECTIVE STATEMENT: Pt reports she looked up vivistim online Pt accompanied by: self  PERTINENT HISTORY: PMHx: COPD, CKD, CRF with hypoxia, DM, HTN, PE, [place] with L residual weakness. diabetes, hypertension, hyperlipidemia, obesity, hypothyroidism, COPD chronic kidney disease, hx of PE, Pt s/p large right middle cerebral artery stroke in August 2020 while living in [place].  She was found to have complete right ICA occlusion.    PRECAUTIONS: Fall and Other: impulsive  WEIGHT BEARING RESTRICTIONS No  PAIN:  Are you having pain?  No reports of pain  FALLS: Has patient fallen in last 6 months?  High fall risk due to impulsivity and poor awareness, did not lock w/c to  transfer  LIVING ENVIRONMENT: Lives with: lives with their family and lives in a nursing home   PLOF: Independent with basic ADLs and Needs assistance with homemaking  PATIENT GOALS vivistim eval  OBJECTIVE:   HAND DOMINANCE: Right  SENSATION: Not tested  MUSCLE TONE: LUE: Moderate and Hypertonic  COGNITION: Overall cognitive status: Impaired impulsive, poor awareness     PATIENT EDUCATION: Education details: vivistim requirements and process for approval, outcome of eval that pt does not qualify for vivistim. Person educated: Patient Education method: Explanation Education comprehension: verbalized understanding  --------------------------------------------------------------------------------------------------------------------- Bubba Camp: IADL Scale  A. Ability to Use Telephone  Operates telephone on own initiative-looks up and dials numbers, etc. - 1  B. Shopping Needs to be accompanied on any shopping trip - 0  C. Food Preparation Needs to have meals prepared and served - 0   D. Housekeeping Performs light daily tasks such as dish washing, bed making - 1  E. Laundry  [place] small items-rinses stockings, etc. - 1  F. Mode of Transportation  Travel limited to taxi or automobile with assistance of another - 0  G. Responsibility for Own Medications Is not capable of dispensing own medication - 0  H. Ability to [employer] day to day purchases, but needs help with banking, major purchases - 1   TOTAL LAWTON BRODY SCORE: 4  -----------------------------------------------------------  Ron Parker Index of Independence in ADL INDEPENDENCE (1) DEPENDENCE (0)  NO supervision, direction or personal assistance WITH supervision, direction, personal assistance or total care   ACTIVITIES 1 or 0  Bathing 1  Dressing 1  Toileting 1  Transferring 1  [place] 1  Feeding 1    KATZ TOTAL:  6  -----------------------------------------------------------  FUGL-MEYER ASSESSMENT   A. Upper Extremity  I. Reflex Activity: Flexors (elbow) 2  Extensors (triceps) 2  Total 4/4     II. Volitional Movement within Synergies: Flexor Synergy:   Shoulder Retraction 0  Shoulder Elevation 1  Shoulder Abduction 1  External Rotation 0  Elbow Flexion 1  Forearm Supination 0  Extensor Synergy:   Shoulder Adduction/Internal Rotation 1  Elbow Extension 1  Forearm Pronation 1  Total 6/18   III. Volitional Mixing of Synergies: Hand to Lumbar Spine 0  Shoulder Flexion 0  Pronation/Supination 1  Total 1/6   IV. Volitional Movement with Little to No Synergy Shoulder Abduction 0  Shoulder Flexion 90+ 0  Pronation/Supination 0  Total 0/6    B. Wrist Stability of Wrist at 15* 1  Repeated Flex/Ext 1  Circumduction 0  Stability at 15*Wrist, 0*Elbow 0  Flexion/Extension - Elbow 0* 0  Total 2/10   C. Hand Mass Flexion 2  Mass Extension 0  Total 2/4    D. Grasp Hook  0  Thumb Adduction - paper 2  Pincer - pen 0  Cylindrical - cup 0  Spherical - ball 0  Total 2/10   E. Coordination/Speed Tremor 1  Dysmetria 0  Time 0  Total 1/6    FUGL-MEYER ASSESSMENT: UPPER EXTREMITY A-E TOTAL: 18/64    ---------------------------------------------------------------------------------------------------------------------    ASSESSMENT:  CLINICAL IMPRESSION: Patient is a 52 yr old female referred to  OT for assessment to determine qualification for implantable nerve stimulator to address LUE functioning.  Completed Fugl Meyer UE assessment, as well as Ron Parker index of Independence in ADL, and Bubba Camp IADL Scale.  Patient does not have  adequate active movement throughout LUE with limitations in elbow extension, shoulder retraction, shoulder elevation, shoulder abduction, external rotation, elbow extension, forearm pronation, cognition, wrist movement, and strength.  Patient  is motivated to improve functional use and movement in her LUE arm. Pt also has cognitive deficits which would limit participation.  PERFORMANCE DEFICITS in functional skills including ADLs, IADLs, coordination, dexterity, tone, ROM, strength, flexibility, Fine motor control, Gross motor control, mobility, decreased knowledge of precautions, decreased knowledge of use of DME, and UE functional use, cognitive skills including attention, learn, problem solving, safety awareness, temperament/personality, thought, and understand, and psychosocial skills including coping strategies, environmental adaptation, habits, interpersonal interactions, and routines and behaviors.   IMPAIRMENTS are limiting patient from ADLs, IADLs, work, play, leisure, and social participation.   COMORBIDITIES may have co-morbidities  that affects occupational performance. Patient will benefit from skilled OT to address above impairments and improve overall function.  MODIFICATION OR ASSISTANCE TO COMPLETE EVALUATION: No modification of tasks or assist necessary to complete an evaluation.  OT OCCUPATIONAL PROFILE AND HISTORY: Detailed assessment: Review of records and additional review of physical, cognitive, psychosocial history related to current functional performance.  CLINICAL DECISION MAKING: LOW - limited treatment options, no task modification necessary  REHAB POTENTIAL: Fair does not meet qualifications  EVALUATION COMPLEXITY: Low  PLAN:Patient does not meet the qualifications  for vivistim at this time due to: limitations in LUE active ROM, coordination, significant spasticity and cognitive deficits. Pt may benefit from traditional occupational therapy at her SNF.  Karla Hoover, OT 11/19/2022, 9:21 AM

## 2022-11-19 ENCOUNTER — Ambulatory Visit: Payer: Medicaid Other | Attending: Neurology | Admitting: Occupational Therapy

## 2022-11-19 ENCOUNTER — Telehealth: Payer: Self-pay | Admitting: Neurology

## 2022-11-19 DIAGNOSIS — R278 Other lack of coordination: Secondary | ICD-10-CM | POA: Diagnosis present

## 2022-11-19 DIAGNOSIS — I69354 Hemiplegia and hemiparesis following cerebral infarction affecting left non-dominant side: Secondary | ICD-10-CM | POA: Insufficient documentation

## 2022-11-19 DIAGNOSIS — M6281 Muscle weakness (generalized): Secondary | ICD-10-CM | POA: Diagnosis present

## 2022-11-19 NOTE — Telephone Encounter (Signed)
OT orders faxed to [facility], phone # [phone].

## 2022-11-19 NOTE — Addendum Note (Signed)
Addended by: Suzzanne Cloud on: 11/19/2022 10:28 AM   Modules accepted: Orders

## 2022-12-13 ENCOUNTER — Other Ambulatory Visit: Payer: Self-pay

## 2022-12-13 ENCOUNTER — Encounter (HOSPITAL_COMMUNITY): Payer: Self-pay | Admitting: Radiology

## 2022-12-13 ENCOUNTER — Emergency Department (HOSPITAL_COMMUNITY): Payer: Medicaid Other

## 2022-12-13 ENCOUNTER — Inpatient Hospital Stay (HOSPITAL_COMMUNITY)
Admission: EM | Admit: 2022-12-13 | Discharge: 2022-12-18 | DRG: 872 | Disposition: A | Discharge status: Home / Self Care | Payer: Medicaid Other | Source: Skilled Nursing Facility | Attending: Internal Medicine | Admitting: Internal Medicine

## 2022-12-13 DIAGNOSIS — E6609 Other obesity due to excess calories: Secondary | ICD-10-CM | POA: Diagnosis present

## 2022-12-13 DIAGNOSIS — E1122 Type 2 diabetes mellitus with diabetic chronic kidney disease: Secondary | ICD-10-CM | POA: Diagnosis present

## 2022-12-13 DIAGNOSIS — J9611 Chronic respiratory failure with hypoxia: Secondary | ICD-10-CM | POA: Diagnosis present

## 2022-12-13 DIAGNOSIS — J449 Chronic obstructive pulmonary disease, unspecified: Secondary | ICD-10-CM | POA: Diagnosis present

## 2022-12-13 DIAGNOSIS — Z825 Family history of asthma and other chronic lower respiratory diseases: Secondary | ICD-10-CM

## 2022-12-13 DIAGNOSIS — Z7901 Long term (current) use of anticoagulants: Secondary | ICD-10-CM

## 2022-12-13 DIAGNOSIS — Z79899 Other long term (current) drug therapy: Secondary | ICD-10-CM

## 2022-12-13 DIAGNOSIS — E872 Acidosis, unspecified: Secondary | ICD-10-CM | POA: Diagnosis present

## 2022-12-13 DIAGNOSIS — Z888 Allergy status to other drugs, medicaments and biological substances status: Secondary | ICD-10-CM

## 2022-12-13 DIAGNOSIS — Z87440 Personal history of urinary (tract) infections: Secondary | ICD-10-CM

## 2022-12-13 DIAGNOSIS — A4151 Sepsis due to Escherichia coli [E. coli]: Principal | ICD-10-CM | POA: Diagnosis present

## 2022-12-13 DIAGNOSIS — A419 Sepsis, unspecified organism: Principal | ICD-10-CM | POA: Diagnosis present

## 2022-12-13 DIAGNOSIS — Z8249 Family history of ischemic heart disease and other diseases of the circulatory system: Secondary | ICD-10-CM

## 2022-12-13 DIAGNOSIS — Z833 Family history of diabetes mellitus: Secondary | ICD-10-CM

## 2022-12-13 DIAGNOSIS — Z86718 Personal history of other venous thrombosis and embolism: Secondary | ICD-10-CM

## 2022-12-13 DIAGNOSIS — Z87891 Personal history of nicotine dependence: Secondary | ICD-10-CM

## 2022-12-13 DIAGNOSIS — K5909 Other constipation: Secondary | ICD-10-CM | POA: Diagnosis present

## 2022-12-13 DIAGNOSIS — I8290 Acute embolism and thrombosis of unspecified vein: Secondary | ICD-10-CM | POA: Diagnosis present

## 2022-12-13 DIAGNOSIS — Z86711 Personal history of pulmonary embolism: Secondary | ICD-10-CM

## 2022-12-13 DIAGNOSIS — Z87892 Personal history of anaphylaxis: Secondary | ICD-10-CM

## 2022-12-13 DIAGNOSIS — Z6832 Body mass index (BMI) 32.0-32.9, adult: Secondary | ICD-10-CM

## 2022-12-13 DIAGNOSIS — I5189 Other ill-defined heart diseases: Secondary | ICD-10-CM

## 2022-12-13 DIAGNOSIS — R519 Headache, unspecified: Secondary | ICD-10-CM

## 2022-12-13 DIAGNOSIS — Z9981 Dependence on supplemental oxygen: Secondary | ICD-10-CM

## 2022-12-13 DIAGNOSIS — J4489 Other specified chronic obstructive pulmonary disease: Secondary | ICD-10-CM | POA: Diagnosis present

## 2022-12-13 DIAGNOSIS — I69354 Hemiplegia and hemiparesis following cerebral infarction affecting left non-dominant side: Secondary | ICD-10-CM

## 2022-12-13 DIAGNOSIS — Z7985 Long-term (current) use of injectable non-insulin antidiabetic drugs: Secondary | ICD-10-CM

## 2022-12-13 DIAGNOSIS — E66811 Obesity, class 1: Secondary | ICD-10-CM

## 2022-12-13 DIAGNOSIS — N390 Urinary tract infection, site not specified: Secondary | ICD-10-CM | POA: Diagnosis not present

## 2022-12-13 DIAGNOSIS — E039 Hypothyroidism, unspecified: Secondary | ICD-10-CM | POA: Diagnosis present

## 2022-12-13 DIAGNOSIS — N1831 Chronic kidney disease, stage 3a: Secondary | ICD-10-CM | POA: Diagnosis present

## 2022-12-13 DIAGNOSIS — E782 Mixed hyperlipidemia: Secondary | ICD-10-CM | POA: Diagnosis present

## 2022-12-13 DIAGNOSIS — Z1152 Encounter for screening for COVID-19: Secondary | ICD-10-CM

## 2022-12-13 DIAGNOSIS — Z91018 Allergy to other foods: Secondary | ICD-10-CM

## 2022-12-13 DIAGNOSIS — I519 Heart disease, unspecified: Secondary | ICD-10-CM | POA: Diagnosis present

## 2022-12-13 DIAGNOSIS — I129 Hypertensive chronic kidney disease with stage 1 through stage 4 chronic kidney disease, or unspecified chronic kidney disease: Secondary | ICD-10-CM | POA: Diagnosis present

## 2022-12-13 DIAGNOSIS — Z7982 Long term (current) use of aspirin: Secondary | ICD-10-CM

## 2022-12-13 DIAGNOSIS — Z79890 Hormone replacement therapy (postmenopausal): Secondary | ICD-10-CM

## 2022-12-13 DIAGNOSIS — E1169 Type 2 diabetes mellitus with other specified complication: Secondary | ICD-10-CM | POA: Diagnosis present

## 2022-12-13 DIAGNOSIS — I10 Essential (primary) hypertension: Secondary | ICD-10-CM | POA: Diagnosis present

## 2022-12-13 DIAGNOSIS — K219 Gastro-esophageal reflux disease without esophagitis: Secondary | ICD-10-CM | POA: Diagnosis present

## 2022-12-13 DIAGNOSIS — Z6833 Body mass index (BMI) 33.0-33.9, adult: Secondary | ICD-10-CM

## 2022-12-13 DIAGNOSIS — E119 Type 2 diabetes mellitus without complications: Secondary | ICD-10-CM

## 2022-12-13 DIAGNOSIS — E785 Hyperlipidemia, unspecified: Secondary | ICD-10-CM | POA: Diagnosis present

## 2022-12-13 DIAGNOSIS — N183 Chronic kidney disease, stage 3 unspecified: Secondary | ICD-10-CM | POA: Diagnosis present

## 2022-12-13 DIAGNOSIS — E871 Hypo-osmolality and hyponatremia: Secondary | ICD-10-CM | POA: Insufficient documentation

## 2022-12-13 DIAGNOSIS — R6520 Severe sepsis without septic shock: Secondary | ICD-10-CM | POA: Diagnosis present

## 2022-12-13 DIAGNOSIS — Z794 Long term (current) use of insulin: Secondary | ICD-10-CM

## 2022-12-13 DIAGNOSIS — N179 Acute kidney failure, unspecified: Secondary | ICD-10-CM | POA: Diagnosis present

## 2022-12-13 DIAGNOSIS — Z7951 Long term (current) use of inhaled steroids: Secondary | ICD-10-CM

## 2022-12-13 LAB — COMPREHENSIVE METABOLIC PANEL
ALT: 18 U/L (ref 0–44)
AST: 29 U/L (ref 15–41)
Albumin: 3.8 g/dL (ref 3.5–5.0)
Alkaline Phosphatase: 83 U/L (ref 38–126)
Anion gap: 11 (ref 5–15)
BUN: 21 mg/dL — ABNORMAL HIGH (ref 6–20)
CO2: 26 mmol/L (ref 22–32)
Calcium: 9 mg/dL (ref 8.9–10.3)
Chloride: 94 mmol/L — ABNORMAL LOW (ref 98–111)
Creatinine, Ser: 1.55 mg/dL — ABNORMAL HIGH (ref 0.44–1.00)
GFR, Estimated: 41 mL/min — ABNORMAL LOW (ref >60)
Glucose, Bld: 229 mg/dL — ABNORMAL HIGH (ref 70–99)
Potassium: 4 mmol/L (ref 3.5–5.1)
Sodium: 131 mmol/L — ABNORMAL LOW (ref 135–145)
Total Bilirubin: 1.1 mg/dL (ref 0.3–1.2)
Total Protein: 7.4 g/dL (ref 6.5–8.1)

## 2022-12-13 LAB — GLUCOSE, CAPILLARY: Glucose-Capillary: 203 mg/dL — ABNORMAL HIGH (ref 70–99)

## 2022-12-13 LAB — PROTIME-INR
INR: 1.6 — ABNORMAL HIGH (ref 0.8–1.2)
Prothrombin Time: 18.6 seconds — ABNORMAL HIGH (ref 11.4–15.2)

## 2022-12-13 LAB — CBC WITH DIFFERENTIAL/PLATELET
Abs Immature Granulocytes: 0.09 10*3/uL — ABNORMAL HIGH (ref 0.00–0.07)
Basophils Absolute: 0.1 10*3/uL (ref 0.0–0.1)
Basophils Relative: 0 %
Eosinophils Absolute: 0.1 10*3/uL (ref 0.0–0.5)
Eosinophils Relative: 1 %
HCT: 40.7 % (ref 36.0–46.0)
Hemoglobin: 13.2 g/dL (ref 12.0–15.0)
Immature Granulocytes: 1 %
Lymphocytes Relative: 9 %
Lymphs Abs: 1.4 10*3/uL (ref 0.7–4.0)
MCH: 26.6 pg (ref 26.0–34.0)
MCHC: 32.4 g/dL (ref 30.0–36.0)
MCV: 81.9 fL (ref 80.0–100.0)
Monocytes Absolute: 0.6 10*3/uL (ref 0.1–1.0)
Monocytes Relative: 4 %
Neutro Abs: 14 10*3/uL — ABNORMAL HIGH (ref 1.7–7.7)
Neutrophils Relative %: 85 %
Platelets: 185 10*3/uL (ref 150–400)
RBC: 4.97 MIL/uL (ref 3.87–5.11)
RDW: 13.5 % (ref 11.5–15.5)
WBC: 16.3 10*3/uL — ABNORMAL HIGH (ref 4.0–10.5)
nRBC: 0 % (ref 0.0–0.2)

## 2022-12-13 LAB — URINALYSIS, W/ REFLEX TO CULTURE (INFECTION SUSPECTED)
Bilirubin Urine: NEGATIVE
Glucose, UA: NEGATIVE mg/dL
Ketones, ur: NEGATIVE mg/dL
Nitrite: NEGATIVE
Protein, ur: 100 mg/dL — AB
Specific Gravity, Urine: 1.013 (ref 1.005–1.030)
pH: 5 (ref 5.0–8.0)

## 2022-12-13 LAB — RESP PANEL BY RT-PCR (RSV, FLU A&B, COVID)  RVPGX2
Influenza A by PCR: NEGATIVE
Influenza B by PCR: NEGATIVE
Resp Syncytial Virus by PCR: NEGATIVE
SARS Coronavirus 2 by RT PCR: NEGATIVE

## 2022-12-13 LAB — I-STAT BETA HCG BLOOD, ED (MC, WL, AP ONLY): I-stat hCG, quantitative: <5 m[IU]/mL (ref <5)

## 2022-12-13 LAB — APTT: aPTT: 31 seconds (ref 24–36)

## 2022-12-13 LAB — LACTIC ACID, PLASMA
Lactic Acid, Venous: 2.3 mmol/L (ref 0.5–1.9)
Lactic Acid, Venous: 1.6 mmol/L (ref 0.5–1.9)

## 2022-12-13 LAB — BRAIN NATRIURETIC PEPTIDE: B Natriuretic Peptide: 74.9 pg/mL (ref 0.0–100.0)

## 2022-12-13 LAB — CBG MONITORING, ED: Glucose-Capillary: 145 mg/dL — ABNORMAL HIGH (ref 70–99)

## 2022-12-13 LAB — TSH: TSH: 1.921 u[IU]/mL (ref 0.350–4.500)

## 2022-12-13 MED ORDER — SODIUM CHLORIDE 0.9 % IV SOLN
1.0000 g | Freq: Once | INTRAVENOUS | Status: AC
  Administered 2022-12-13: 1 g via INTRAVENOUS
  Filled 2022-12-13: qty 20

## 2022-12-13 MED ORDER — ALBUTEROL SULFATE (2.5 MG/3ML) 0.083% IN NEBU
3.0000 mL | INHALATION_SOLUTION | Freq: Four times a day (QID) | RESPIRATORY_TRACT | Status: DC | PRN
  Administered 2022-12-14: 3 mL via RESPIRATORY_TRACT
  Filled 2022-12-13: qty 3

## 2022-12-13 MED ORDER — SODIUM CHLORIDE 0.9 % IV SOLN: 2.0000 g | Freq: Once | INTRAVENOUS | Status: DC

## 2022-12-13 MED ORDER — TIOTROPIUM BROMIDE MONOHYDRATE 2.5 MCG/ACT IN AERS: 2.5000 ug | INHALATION_SPRAY | Freq: Every morning | RESPIRATORY_TRACT | Status: DC

## 2022-12-13 MED ORDER — LACTATED RINGERS IV SOLN: INTRAVENOUS | Status: AC

## 2022-12-13 MED ORDER — ASPIRIN 81 MG PO CHEW
81.0000 mg | CHEWABLE_TABLET | Freq: Every morning | ORAL | Status: DC
  Administered 2022-12-14 – 2022-12-18 (×5): 81 mg via ORAL
  Filled 2022-12-13 (×5): qty 1

## 2022-12-13 MED ORDER — MOMETASONE FURO-FORMOTEROL FUM 100-5 MCG/ACT IN AERO
2.0000 | INHALATION_SPRAY | Freq: Two times a day (BID) | RESPIRATORY_TRACT | Status: DC
  Administered 2022-12-14 – 2022-12-18 (×9): 2 via RESPIRATORY_TRACT
  Filled 2022-12-13: qty 8.8

## 2022-12-13 MED ORDER — ONDANSETRON HCL 4 MG/2ML IJ SOLN: 4.0000 mg | Freq: Four times a day (QID) | INTRAMUSCULAR | Status: DC | PRN

## 2022-12-13 MED ORDER — INSULIN GLARGINE-YFGN 100 UNIT/ML ~~LOC~~ SOLN
10.0000 [IU] | Freq: Two times a day (BID) | SUBCUTANEOUS | Status: DC
  Administered 2022-12-13 – 2022-12-18 (×10): 10 [IU] via SUBCUTANEOUS
  Filled 2022-12-13 (×12): qty 0.1

## 2022-12-13 MED ORDER — TRAMADOL HCL 50 MG PO TABS
50.0000 mg | ORAL_TABLET | Freq: Four times a day (QID) | ORAL | Status: AC | PRN
  Administered 2022-12-13 – 2022-12-14 (×2): 50 mg via ORAL
  Filled 2022-12-13 (×2): qty 1

## 2022-12-13 MED ORDER — APIXABAN 5 MG PO TABS
5.0000 mg | ORAL_TABLET | Freq: Two times a day (BID) | ORAL | Status: DC
  Administered 2022-12-13 – 2022-12-18 (×10): 5 mg via ORAL
  Filled 2022-12-13 (×10): qty 1

## 2022-12-13 MED ORDER — UMECLIDINIUM BROMIDE 62.5 MCG/ACT IN AEPB
1.0000 | INHALATION_SPRAY | Freq: Every day | RESPIRATORY_TRACT | Status: DC
  Administered 2022-12-14 – 2022-12-18 (×5): 1 via RESPIRATORY_TRACT
  Filled 2022-12-13: qty 7

## 2022-12-13 MED ORDER — ACETAMINOPHEN 325 MG PO TABS
650.0000 mg | ORAL_TABLET | Freq: Once | ORAL | Status: AC
  Administered 2022-12-13: 650 mg via ORAL
  Filled 2022-12-13: qty 2

## 2022-12-13 MED ORDER — INSULIN ASPART 100 UNIT/ML IJ SOLN
0.0000 [IU] | Freq: Three times a day (TID) | INTRAMUSCULAR | Status: DC
  Administered 2022-12-13 – 2022-12-14 (×3): 1 [IU] via SUBCUTANEOUS
  Administered 2022-12-15 – 2022-12-16 (×6): 2 [IU] via SUBCUTANEOUS
  Administered 2022-12-17 (×3): 1 [IU] via SUBCUTANEOUS
  Administered 2022-12-18: 2 [IU] via SUBCUTANEOUS
  Filled 2022-12-13: qty 0.09

## 2022-12-13 MED ORDER — ACETAMINOPHEN 325 MG PO TABS
650.0000 mg | ORAL_TABLET | Freq: Four times a day (QID) | ORAL | Status: DC | PRN
  Administered 2022-12-13 – 2022-12-14 (×2): 650 mg via ORAL
  Filled 2022-12-13 (×2): qty 2

## 2022-12-13 MED ORDER — LACTATED RINGERS IV BOLUS (SEPSIS)
1000.0000 mL | Freq: Once | INTRAVENOUS | Status: AC
  Administered 2022-12-13: 1000 mL via INTRAVENOUS

## 2022-12-13 MED ORDER — CYCLOBENZAPRINE HCL 10 MG PO TABS
10.0000 mg | ORAL_TABLET | Freq: Three times a day (TID) | ORAL | Status: DC | PRN
  Administered 2022-12-13 – 2022-12-16 (×4): 10 mg via ORAL
  Filled 2022-12-13 (×4): qty 1

## 2022-12-13 MED ORDER — ONDANSETRON HCL 4 MG PO TABS: 4.0000 mg | ORAL_TABLET | Freq: Four times a day (QID) | ORAL | Status: DC | PRN

## 2022-12-13 MED ORDER — ACETAMINOPHEN 650 MG RE SUPP: 650.0000 mg | Freq: Four times a day (QID) | RECTAL | Status: DC | PRN

## 2022-12-13 MED ORDER — IPRATROPIUM-ALBUTEROL 0.5-2.5 (3) MG/3ML IN SOLN: 3.0000 mL | Freq: Three times a day (TID) | RESPIRATORY_TRACT | Status: DC | PRN

## 2022-12-13 NOTE — Assessment & Plan Note (Signed)
Stable, continue to monitor  ?

## 2022-12-13 NOTE — Assessment & Plan Note (Addendum)
Bp soft, but maintaining her MAP Continue IVF, watch closely, will move to progressive  Hold toprol

## 2022-12-13 NOTE — Assessment & Plan Note (Signed)
Continue prevacid '30mg'$  daily

## 2022-12-13 NOTE — ED Provider Notes (Signed)
[facility] Provider Note   CSN: [id number] Arrival date & time: 12/13/22  1027     History  No chief complaint on file.   Karla Hoover is a 52 y.o. female.  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  Patient states that her symptoms are consistent with previous UTIs other than the severity of her headache that she had last night.  She had nausea without vomiting.  No confusion.       Home Medications Prior to Admission medications   Medication Sig Start Date End Date Taking? Authorizing Provider  acetaminophen (TYLENOL) 500 MG tablet Take 1,000 mg by mouth in the morning, at noon, and at bedtime.    [provider]  albuterol (VENTOLIN HFA) 108 (90 Base) MCG/ACT inhaler Inhale 2 puffs into the lungs every 6 (six) hours as needed for wheezing or shortness of breath. 10/04/19   Kerin Perna, NP  apixaban Arne Cleveland) 5 MG TABS tablet Take 1 tablet ('5mg'$ ) twice daily 09/10/22   Donne Hazel, MD  apixaban (ELIQUIS) 5 MG TABS tablet Take 5 mg by mouth 2 (two) times daily.    [provider]  aspirin 81 MG chewable tablet Chew 81 mg by mouth in the morning.    [provider]  atorvastatin (LIPITOR) 80 MG tablet Take 1 tablet (80 mg total) by mouth daily. Patient taking differently: Take 80 mg by mouth at bedtime. 10/04/19   Kerin Perna, NP  B Complex-Biotin-FA TABS Take 1 tablet by mouth in the morning.    [provider]  busPIRone (BUSPAR) 5 MG tablet Take 5 mg by mouth in the morning and at bedtime.     [provider]  chlorhexidine (PERIDEX) 0.12 % solution Use as directed 15 mLs in the mouth or throat every morning. Rinse and spit after oral care. Do not swallow.    [provider]  ciprofloxacin (CIPRO) 500 MG tablet Take 1 tablet (500 mg total) by mouth 2 (two) times daily. 09/10/22   Donne Hazel, MD  Cranberry 250 MG TABS Take 250 mg by mouth at bedtime.    [provider]  cyclobenzaprine (FLEXERIL) 10 MG tablet Take 1 tablet (10 mg total) by mouth 3 (three) times daily as needed for muscle spasms. Patient taking differently: Take 10 mg by mouth every 8 (eight) hours as needed for muscle spasms. 10/04/19   Kerin Perna, NP  Dulaglutide (TRULICITY) [id number] [id number] SOPN Inject 0.75 mg into the skin every Friday.    [provider]  EPINEPHrinesnap 1 MG/ML KIT Inject 1 mg as directed as needed (allergic reaction).    [provider]  ergocalciferol (VITAMIN D2) 1.25 MG (50000 UT) capsule Take 50,000 Units by mouth every Thursday.    [provider]  ezetimibe (ZETIA) 10 MG tablet Take 10 mg by mouth at bedtime.    [provider]  gabapentin (NEURONTIN) 400 MG capsule Take 1 capsule (400 mg total) by mouth 3 (three) times daily. 09/10/22   Donne Hazel, MD  insulin glargine (LANTUS) 100 UNIT/ML injection Inject 30 Units into the skin 2 (two) times daily.  [provider]  insulin lispro (HUMALOG) 100 UNIT/ML injection Inject 0-14 Units into the skin See admin instructions. Inject 0-14 units twice daily as per sliding scale : CBG 0-200 : 0 units CBG 201-250 : 2 units CBG 251-300 : 4 units CBG 301-350 : 6 units CBG 351-400 : 8 units CBG 401-450 : 10 units CBG 451-500 : 12 units (if BP reads high, inject 14 units)    [provider]  ipratropium-albuterol (DUONEB) 0.5-2.5 (3) MG/3ML SOLN Inhale 1 vial via nebulizer 3 (three) times daily. Patient taking differently: Take 3 mLs by nebulization See admin  instructions. 2 entries on MAR :  1 vial via nebulizer three times daily + 1 vial every 8 hours as needed for shortness of breath, wheezing. 12/03/21   Swayze, Ava, DO  lansoprazole (PREVACID) 30 MG capsule Take 30 mg by mouth every morning.    [provider]  levothyroxine (SYNTHROID) 75 MCG tablet Take 75 mcg by mouth in the morning. (0000)    [provider]  Melatonin 5 MG TBDP Take 5 mg by mouth at bedtime.    [provider]  Menthol, Topical Analgesic, 5 % GEL Apply 1 application  topically in the morning. To neck    [provider]  metoprolol succinate (TOPROL-XL) 50 MG 24 hr tablet Take 50 mg by mouth in the morning.    [provider]  Omega-3 Fatty Acids (FISH OIL) 1000 MG CAPS Take 1,000 mg by mouth 3 (three) times daily.    [provider]  OXYGEN Inhale 2 L/min into the lungs as needed (for shortness of breath).    [provider]  Propyl Glycol-Hydroxyethylcell (NASAL MOIST) GEL Place 1 application  into both nostrils at bedtime.    [provider]  Skin Protectants, Misc. (EUCERIN) cream Apply 1 Application topically See admin instructions. Apply topically once daily and as needed for dry skin.    [provider]  SYMBICORT 80-4.5 MCG/ACT inhaler Inhale 2 puffs into the lungs 2 (two) times daily.    [provider]  Tiotropium Bromide Monohydrate (SPIRIVA RESPIMAT) 2.5 MCG/ACT AERS Inhale 2.5 mcg into the lungs in the morning.    [provider]      Allergies    Guaifenesin, Kiwi extract, Levaquin [levofloxacin], Strawberry extract, and Lioresal [baclofen]    Review of Systems   Review of Systems  Physical Exam Updated Vital Signs BP 105/74 (BP Location: Right Arm)   Pulse 88   Temp 100.3 F (37.9 C) (Rectal)   LMP 10/20/2019   SpO2 95%  Physical Exam Vitals and nursing note reviewed.  Constitutional:      General: She is not in acute distress.    Appearance: She is  well-developed.  HENT:     Head: Normocephalic and atraumatic.     Right Ear: External ear normal.     Left Ear: External ear normal.     Nose: Nose normal.  Eyes:     Conjunctiva/sclera: Conjunctivae normal.  Neck:     Comments: No meningeal signs Cardiovascular:     Rate and Rhythm: Regular rhythm. Tachycardia present.     Heart sounds: No murmur heard. Pulmonary:     Effort: No respiratory distress.     Breath sounds: No wheezing, rhonchi or rales.  Abdominal:     Palpations: Abdomen is soft.     Tenderness: There is abdominal tenderness. There is no guarding or rebound.     Comments: Mild suprapubic  Musculoskeletal:     Cervical back: Normal range of motion and neck supple.     Right lower leg: No edema.     Left lower leg: No edema.  Skin:    General: Skin is warm and dry.     Findings: No rash.  Neurological:     General: No focal deficit present.     Mental Status: She is alert. Mental status is at baseline.  Psychiatric:        Mood and Affect: Mood normal.     ED Results / Procedures / Treatments   Labs (all labs ordered are listed, but only abnormal results are displayed) Labs Reviewed - No data to display  EKG None  Radiology No results found.  Procedures Procedures    Medications Ordered in ED Medications - No data to display  ED Course/ Medical Decision Making/ A&P    Patient seen and examined. History obtained directly from patient.   Labs/EKG: Ordered sepsis workup including PT/INR to evaluate for evidence of endorgan damage.  EKG. respiratory panel.  Imaging: Ordered chest x-ray.  Medications/Fluids: Ordered: Meropenem, IV fluid bolus.  Discussed antibiotic choice with ED pharmacist.  Most recent vital signs reviewed and are as follows: BP 105/74 (BP Location: Right Arm)   Pulse 88   Temp 100.3 F (37.9 C) (Rectal)   LMP 10/20/2019   SpO2 95%   Initial impression: Febrile illness, sepsis, likely urinary source given  history.   3:22 PM Reassessment performed multiple times. Patient appears stable.  Labs personally reviewed and interpreted including: CBC elevated blood cell count of 16.3 and elevated neutrophil count, otherwise unremarkable; CMP with elevated glucose at 229, minimally elevated from baseline creatinine at 1.55, normal LFTs, mild hyponatremia and hypochloridemia at 131 and 94 respectively; lactate 2.3 >> 1.6; viral panel negative; preg neg; INR 1.6 on DOAC, APTT normal.   Urine does show signs of infection with 21-50 white blood cells and white blood cell clumps.  Imaging personally visualized and interpreted including: CT head, agree no acute findings.  Chest x-ray agree no signs of infection.  Reviewed pertinent lab work and imaging with patient at bedside. Questions answered.   Most current vital signs reviewed and are as follows: BP 105/74 (BP Location: Right Arm)   Pulse 88   Temp 100.3 F (37.9 C) (Rectal)   LMP 10/20/2019   SpO2 95%   Plan: Admit to hospital.  Patient discussed with Dr. Sherry Ruffing.  4:00 PM consulted with Dr. Rogers Blocker [facility] hospitalist who will see patient for admission.  CRITICAL CARE Performed by: Carlisle Cater PA-C Total critical care time: 40 minutes Critical care time was exclusive of separately billable procedures and treating other patients. Critical care was necessary to treat or prevent imminent or life-threatening deterioration. Critical care was time spent personally by me on the following activities: development of treatment plan with patient and/or surrogate as well as nursing, discussions with consultants, evaluation of patient's response to treatment, examination of patient, obtaining history from patient or surrogate, ordering and performing treatments and interventions, ordering and review of laboratory studies, ordering and review of radiographic studies, pulse oximetry and re-evaluation of patient's condition.                                Medical Decision Making Amount and/or Complexity of Data Reviewed Labs: ordered. Radiology: ordered. ECG/medicine tests: ordered.  Risk OTC drugs. Prescription drug management. Decision regarding hospitalization.  Patient with fever, workup suggestive of recurrent UTI.  History of ESBL E. coli.  Low concern for pneumonia, URI, COVID/flu, cellulitis, abscess.  Patient did present with a headache which is improving with treatment of fever.  She is at her neurologic baseline and I have low concern for meningitis or encephalitis today.  Head CT negative.        Final Clinical Impression(s) / ED Diagnoses Final diagnoses:  Sepsis with acute renal failure without septic shock, due to unspecified organism, unspecified acute renal failure type (Page)  Acute nonintractable headache, unspecified headache type    Rx / DC Orders ED Discharge Orders     None         Carlisle Cater, PA-C 12/13/22 1601    Tegeler, Gwenyth Allegra, MD 12/14/22 [phone]

## 2022-12-13 NOTE — Assessment & Plan Note (Addendum)
-  A1C of 10.5 in 09/2022 -Resume Trulicity -Continue glargine and SSI at SNF -Continue gabapentin

## 2022-12-13 NOTE — Assessment & Plan Note (Addendum)
-  Continue ASA, statin and PRN flexeril  -PT/OR consults prior to dc

## 2022-12-13 NOTE — Progress Notes (Signed)
Elink following for sepsis protocol. 

## 2022-12-13 NOTE — Assessment & Plan Note (Signed)
Echo 05/2021: EF of 55-60%. Normal LVF. Grade 1 DD Strict I/O and daily weights  Continue medical therapy

## 2022-12-13 NOTE — Assessment & Plan Note (Addendum)
Continue symbicort, spiriva, duoneb and SABA prn

## 2022-12-13 NOTE — H&P (Signed)
History and Physical    Patient: Karla Hoover [id number] DOB: 24-Feb-1971 DOA: 12/13/2022 DOS: the patient was seen and examined on 12/13/2022 PCP: Pcp, No  Patient coming from: SNF - [facility]   Chief Complaint: fever/weakness   HPI: Karla Hoover is a 52 y.o. female with medical history significant of COPD, chronic respiratory failure on nocturnal oxygen ,CKD stage 3a, T2DM, HTN, GERD, hx of DVT/PE, hypothyroidism, HLD, grade 1 DD,  hx of CVA with left sided deficits, recurrent ESBL UTI who presented to ED with fever and shortness of breath.  She states she couldn't sleep last night and was up every hour. She had a headache last night and felt short of breath.  She got light headed and felt like she was going to fall. This morning she was gasping for air and tells me her blood pressure was low. EMS was called. No recorded fevers at SNF, but she had chills. She had some nausea, no vomiting. She has dysuria, frequency and urgency that started last night. She said her urine looks different and smells bad. No CVA tenderness.   No vision changes, chest pain or palpitations, cough, abdominal pain, diarrhea, leg swelling,    Hx of ESBL UTI in 08/30/22 and 09/06/2022   ER Course:  vitals: temp: 100.2, bp: 105/74, HR: 108, oxygen: 95% on 2L oxygen [place]  Pertinent labs: wbc: 16.3, sodium: 131, BUN: 21, creatinine: 1.55 (1.4-1.6), INR: 1.6,  Lactic acid: 2.3>1.6 CXR: no acute finding Head CT: No acute intracranial abnormality. 2. Redemonstrated chronic infarcts in the right MCA territory involving the right basal ganglia, posterior right frontal lobe, and right parietal lobe In ED: code sepsis activated. Given 1L IVF bolus and meropenem. BC and UC obtained. TRH asked to admit.   Review of Systems: As mentioned in the history of present illness. All other systems reviewed and are negative. Past Medical History:  Diagnosis Date   Asthma    Bilateral pulmonary embolism ([place]) 11/15/2019    Chronic kidney disease, stage 3a ([place]) 02/09/2021   Chronic respiratory failure with hypoxia ([place]) 02/09/2021   COPD (chronic obstructive pulmonary disease) ([place])    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   Former smoker 36 pack year smoking history     Diabetes mellitus without complication ([place])    Essential hypertension 05/04/2009   Qualifier: Diagnosis of  By: Quentin Cornwall CMA, Jessica     GERD without esophagitis 02/09/2021   Grade I diastolic dysfunction [id number]   Hypertension    Hypothyroidism 02/09/2021   Mixed 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 ([place]) 02/09/2021   Pulmonary embolism ([place]) 11/16/2019   Stroke ([place])    Type 2 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 disease, with long-term current use of insulin ([place]) 02/09/2021   No past surgical history on file. Social History:  reports that she quit smoking about 3 years ago. Her smoking use included cigarettes. She started smoking about 39 years ago. She has a 18.00 pack-year smoking history. She has never used smokeless tobacco. She reports that she does not drink alcohol and does not use drugs.  Allergies  Allergen Reactions   Guaifenesin Anaphylaxis   Kiwi Extract Anaphylaxis and Rash   Levaquin [Levofloxacin] Shortness Of Breath and Rash   Strawberry Extract Anaphylaxis and Rash   Lioresal [Baclofen] Other (See Comments)    Near syncope/ fall    Family History  Problem Relation Age of Onset   Diabetes Mother    COPD Sister  Heart failure Sister     Prior to Admission medications   Medication Sig Start Date End Date Taking? Authorizing Provider  acetaminophen (TYLENOL) 500 MG tablet Take 1,000 mg by mouth in the morning, at noon, and at bedtime.    [provider]  albuterol (VENTOLIN HFA) 108 (90 Base) MCG/ACT inhaler Inhale 2 puffs into the lungs every 6 (six) hours as needed for wheezing or shortness of breath. 10/04/19   Kerin Perna, NP  apixaban  Arne [place]) 5 MG TABS tablet Take 1 tablet ('5mg'$ ) twice daily 09/10/22   Donne Hazel, MD  apixaban (ELIQUIS) 5 MG TABS tablet Take 5 mg by mouth 2 (two) times daily.    [provider]  aspirin 81 MG chewable tablet Chew 81 mg by mouth in the morning.    [provider]  atorvastatin (LIPITOR) 80 MG tablet Take 1 tablet (80 mg total) by mouth daily. Patient taking differently: Take 80 mg by mouth at bedtime. 10/04/19   Kerin Perna, NP  B Complex-Biotin-FA TABS Take 1 tablet by mouth in the morning.    [provider]  busPIRone (BUSPAR) 5 MG tablet Take 5 mg by mouth in the morning and at bedtime.    [provider]  chlorhexidine (PERIDEX) 0.12 % solution Use as directed 15 mLs in the mouth or throat every morning. Rinse and spit after oral care. Do not swallow.    [provider]  ciprofloxacin (CIPRO) 500 MG tablet Take 1 tablet (500 mg total) by mouth 2 (two) times daily. 09/10/22   Donne Hazel, MD  Cranberry 250 MG TABS Take 250 mg by mouth at bedtime.    [provider]  cyclobenzaprine (FLEXERIL) 10 MG tablet Take 1 tablet (10 mg total) by mouth 3 (three) times daily as needed for muscle spasms. Patient taking differently: Take 10 mg by mouth every 8 (eight) hours as needed for muscle spasms. 10/04/19   Kerin Perna, NP  Dulaglutide (TRULICITY) [id number] SOPN Inject 0.75 mg into the skin every Friday.    [provider]  EPINEPHrinesnap 1 MG/ML KIT Inject 1 mg as directed as needed (allergic reaction).    [provider]  ergocalciferol (VITAMIN D2) 1.25 MG (50000 UT) capsule Take 50,000 Units by mouth every Thursday.    [provider]  ezetimibe (ZETIA) 10 MG tablet Take 10 mg by mouth at bedtime.    [provider]  gabapentin (NEURONTIN) 400 MG capsule Take 1 capsule (400 mg total) by mouth 3 (three) times daily. 09/10/22   Donne Hazel, MD  insulin glargine (LANTUS) 100  UNIT/ML injection Inject 30 Units into the skin 2 (two) times daily.    [provider]  insulin lispro (HUMALOG) 100 UNIT/ML injection Inject 0-14 Units into the skin See admin instructions. Inject 0-14 units twice daily as per sliding scale : CBG 0-200 : 0 units CBG 201-250 : 2 units CBG 251-300 : 4 units CBG 301-350 : 6 units CBG 351-400 : 8 units CBG 401-450 : 10 units CBG 451-500 : 12 units (if BP reads high, inject 14 units)    [provider]  ipratropium-albuterol (DUONEB) 0.5-2.5 (3) MG/3ML SOLN Inhale 1 vial via nebulizer 3 (three) times daily. Patient taking differently: Take 3 mLs by nebulization See admin instructions. 2 entries on MAR :  1 vial via nebulizer three times daily + 1 vial every 8 hours as needed for shortness of breath, wheezing. 12/03/21  Swayze, Ava, DO  lansoprazole (PREVACID) 30 MG capsule Take 30 mg by mouth every morning.    [provider]  levothyroxine (SYNTHROID) 75 MCG tablet Take 75 mcg by mouth in the morning. (0000)    [provider]  Melatonin 5 MG TBDP Take 5 mg by mouth at bedtime.    [provider]  Menthol, Topical Analgesic, 5 % GEL Apply 1 application  topically in the morning. To neck    [provider]  metoprolol succinate (TOPROL-XL) 50 MG 24 hr tablet Take 50 mg by mouth in the morning.    [provider]  Omega-3 Fatty Acids (FISH OIL) 1000 MG CAPS Take 1,000 mg by mouth 3 (three) times daily.    [provider]  OXYGEN Inhale 2 L/min into the lungs as needed (for shortness of breath).    [provider]  Propyl Glycol-Hydroxyethylcell (NASAL MOIST) GEL Place 1 application  into both nostrils at bedtime.    [provider]  Skin Protectants, Misc. (EUCERIN) cream Apply 1 Application topically See admin instructions. Apply topically once daily and as needed for dry skin.    [provider]  SYMBICORT 80-4.5 MCG/ACT inhaler Inhale 2 puffs into  the lungs 2 (two) times daily.    [provider]  Tiotropium Bromide Monohydrate (SPIRIVA RESPIMAT) 2.5 MCG/ACT AERS Inhale 2.5 mcg into the lungs in the morning.    [provider]    Physical Exam: Vitals:   12/13/22 1801 12/13/22 1916 12/13/22 2035 12/13/22 2145  BP:   138/71   Pulse:   (!) 102   Resp:      Temp:  100.3 F (37.9 C) (!) 102.7 F (39.3 C) 99.9 F (37.7 C)  TempSrc:  Oral Oral Oral  SpO2:   97%   Weight: 96.1 kg      General:  Appears calm and comfortable and is in NAD Eyes:  PERRL, EOMI, normal lids, iris ENT:  grossly normal hearing, lips & tongue, mmm; appropriate dentition Neck:  no LAD, masses or thyromegaly; no carotid bruits Cardiovascular:  RRR, no m/r/g. No LE edema.  Respiratory:   CTA bilaterally with no wheezes/rales/rhonchi.  Normal respiratory effort. Abdomen:  soft, NT, ND, NABS Back:   normal alignment, no CVAT Skin:  no rash or induration seen on limited exam Musculoskeletal:  left sided weakness at baseline.  no bony abnormality Lower extremity:  LE edema in LLE  Limited foot exam with no ulcerations.  1+ distal pulses. Psychiatric:  grossly normal mood and affect, speech fluent and appropriate, AOx3 Neurologic:  CN 2-12 grossly intact, moves all extremities in coordinated fashion, sensation intact. Negative brudzinski    Radiological Exams on Admission: Independently reviewed - see discussion in A/P where applicable  CT HEAD WO CONTRAST (5MM)  Result Date: 12/13/2022 CLINICAL DATA:  Headache.  Sudden severe. EXAM: CT HEAD WITHOUT CONTRAST TECHNIQUE: Contiguous axial images were obtained from the base of the skull through the vertex without intravenous contrast. RADIATION DOSE REDUCTION: This exam was performed according to the departmental dose-optimization program which includes automated exposure control, adjustment of the mA and/or kV according to patient size and/or use of iterative reconstruction technique. COMPARISON:   CT Head 11/09/21 FINDINGS: Brain: No evidence of acute infarction, hemorrhage, hydrocephalus, extra-axial collection or mass lesion/mass effect. Redemonstrated chronic infarct in the right basal ganglia, posterior right frontal lobe, and right parietal lobe. Vascular: No hyperdense vessel or unexpected calcification. Skull: Normal. Negative for fracture or focal lesion.  Sinuses/Orbits: No middle ear or mastoid effusion. Paranasal sinuses are clear. Bilateral orbits are unremarkable. Other: None. IMPRESSION: 1. No acute intracranial abnormality. 2. Redemonstrated chronic infarcts in the right MCA territory involving the right basal ganglia, posterior right frontal lobe, and right parietal lobe. Electronically Signed   By: Marin Roberts M.D.   On: 12/13/2022 14:13   DG Chest Port 1 View  Result Date: 12/13/2022 CLINICAL DATA:  Questionable sepsis EXAM: PORTABLE CHEST 1 VIEW COMPARISON:  Chest radiographs dated September 06, 2022 FINDINGS: The heart size and mediastinal contours are within normal limits. Both lungs are clear. The visualized skeletal structures are unremarkable. IMPRESSION: No active disease. Electronically Signed   By: Keane Police D.O.   On: 12/13/2022 11:14    EKG: pending    Labs on Admission: I have personally reviewed the available labs and imaging studies at the time of the admission.  Pertinent labs:   wbc: 16.3,  sodium: 131,  BUN: 21,  creatinine: 1.55 (1.4-1.6),  INR: 1.6,  Lactic acid: 2.3>1.6  Assessment and Plan: Principal Problem:   Sepsis due to urinary tract infection (HCC) Active Problems:   Hyponatremia   Chronic respiratory failure with hypoxia (HCC)   COPD with chronic bronchitis (HCC)   Essential hypertension   Hemiparesis affecting left side as late effect of cerebrovascular accident (CVA) ([place])   DM (diabetes mellitus), type 2 (HCC)   hx of PE/DVT   Grade I diastolic dysfunction   CKD (chronic kidney disease) stage 3, GFR 30-59 ml/min (HCC)   GERD  without esophagitis   Hypothyroidism    Assessment and Plan: * Sepsis due to urinary tract infection ([place]) 52 year old female with hx of ESBL UTI presented to ED with complaints of weakness and fever found to be septic and urine as likely source -obs to progressive -bp soft, but maintaining MAP  -cultures pending -lactic acidosis resolved -continue PRN antipyretics  -continue meropenem and IVF -hx of ESBL UTI in 08/30/2022 and 09/06/2022  -trend cbc   Hyponatremia Corrects to 134 for hyperglycemia Continue fluids/trend   Chronic respiratory failure with hypoxia (HCC) On 2L PRN at SNF, stable   COPD with chronic bronchitis (HCC) Continue symbicort, spiriva, duoneb and SABA prn   Essential hypertension Bp soft, but maintaining her MAP Continue IVF, watch closely, will move to progressive  Hold toprol   Hemiparesis affecting left side as late effect of cerebrovascular accident (CVA) ([place]) Continue medical management  ASA, statin and PRN flexeril   DM (diabetes mellitus), type 2 (HCC) A1C of 10.5 in 09/2022 Continue long acting insulin, unsure of dosing. MAR not done. Will give low dose tonight and f/u on med rec tomorrow  SSI and accuchecks qac/hs   hx of PE/DVT Continue eliquis  Grade I diastolic dysfunction Echo 05/2021: EF of 55-60%. Normal LVF. Grade 1 DD Strict I/O and daily weights  Continue medical therapy   CKD (chronic kidney disease) stage 3, GFR 30-59 ml/min (HCC) Stable, continue to monitor   GERD without esophagitis Continue prevacid '30mg'$  daily   Hypothyroidism TSH in 12/2021, update pending  Continue synthroid 11mg daily     Advance Care Planning:   Code Status: Full Code   Consults: none   DVT Prophylaxis: eliquis   Family Communication: none    Severity of Illness: The appropriate patient status for this patient is OBSERVATION. Observation status is judged to be reasonable and necessary in order to provide the required intensity of  service to ensure the patient's  safety. The patient's presenting symptoms, physical exam findings, and initial radiographic and laboratory data in the context of their medical condition is felt to place them at decreased risk for further clinical deterioration. Furthermore, it is anticipated that the patient will be medically stable for discharge from the hospital within 2 midnights of admission.   Author: Orma Flaming, MD 12/13/2022 9:59 PM  For on call review [email or web address].

## 2022-12-13 NOTE — ED Notes (Signed)
ED TO INPATIENT HANDOFF REPORT  ED Nurse Name and Phone #: Waynette Buttery Name/Age/Gender Karla Hoover 52 y.o. female Room/Bed: WA25/WA25  Code Status   Code Status: Prior  Home/SNF/Other Rehab Patient oriented to: self, place, time, and situation Is this baseline? Yes   Triage Complete: Triage complete  Chief Complaint Sepsis due to urinary tract infection ([place]) [A41.9, N39.0]  Triage Note Pt coming from [facility] via EMS complaining of headache, fever, nausea since last night. She had some burning and increase urination.   CBG 235 O2 88% HR 114 BP 112/82 T 101.3   Allergies Allergies  Allergen Reactions   Guaifenesin Anaphylaxis   Kiwi Extract Anaphylaxis and Rash   Levaquin [Levofloxacin] Shortness Of Breath and Rash   Strawberry Extract Anaphylaxis and Rash   Lioresal [Baclofen] Other (See Comments)    Near syncope/ fall    Level of Care/Admitting Diagnosis ED Disposition     ED Disposition  Admit   Condition  --   Comment  Hospital Area: [facility] [[id number]]  Level of Care: Telemetry [5]  Admit to tele based on following criteria: Other see comments  Comments: sepsis  May place patient in observation at [facility] or [facility] if equivalent level of care is available:: Yes  Covid Evaluation: Confirmed COVID Negative  Diagnosis: Sepsis due to urinary tract infection [facility]) [id number]  Admitting Physician: Orma Flaming [id number]  Attending Physician: Adora Fridge          B Medical/Surgery History Past Medical History:  Diagnosis Date   Asthma    Bilateral pulmonary embolism ([place]) 11/15/2019   Chronic kidney disease, stage 3a ([place]) 02/09/2021   Chronic respiratory failure with hypoxia ([place]) 02/09/2021   COPD (chronic obstructive pulmonary disease) ([place])    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   Former smoker 36 pack year smoking history     Diabetes mellitus without complication ([place])     Essential hypertension 05/04/2009   Qualifier: Diagnosis of  By: Quentin Cornwall CMA, Jessica     GERD without esophagitis 02/09/2021   Grade I diastolic dysfunction [id number]   Hypertension    Hypothyroidism 02/09/2021   Mixed 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 ([place]) 02/09/2021   Pulmonary embolism ([place]) 11/16/2019   Stroke ([place])    Type 2 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 disease, with long-term current use of insulin ([place]) 02/09/2021   No past surgical history on file.   A IV Location/Drains/Wounds Patient Lines/Drains/Airways Status     Active Line/Drains/Airways     Name Placement date Placement time Site Days   Peripheral IV 12/13/22 20 G Anterior;Left;Proximal;Upper Arm 12/13/22  1103  Arm  less than 1   Peripheral IV 12/13/22 22 G Anterior;Right Forearm 12/13/22  1104  Forearm  less than 1            Intake/Output Last 24 hours No intake or output data in the 24 hours ending 12/13/22 1557  Labs/Imaging Results for orders placed or performed during the hospital encounter of 12/13/22 (from the past 48 hour(s))  Urinalysis, w/ Reflex to Culture (Infection Suspected) -Urine, Clean Catch     Status: Abnormal   Collection Time: 12/13/22 10:52 AM  Result Value Ref Range   Specimen Source URINE, CLEAN CATCH    Color, Urine YELLOW YELLOW   APPearance HAZY (A) CLEAR   Specific Gravity, Urine 1.013 1.005 - 1.030   pH 5.0 5.0 - 8.0   Glucose,  UA NEGATIVE NEGATIVE mg/dL   Hgb urine dipstick MODERATE (A) NEGATIVE   Bilirubin Urine NEGATIVE NEGATIVE   Ketones, ur NEGATIVE NEGATIVE mg/dL   Protein, ur 100 (A) NEGATIVE mg/dL   Nitrite NEGATIVE NEGATIVE   Leukocytes,Ua SMALL (A) NEGATIVE   RBC / HPF 11-20 0 - 5 RBC/hpf   WBC, UA 21-50 0 - 5 WBC/hpf    Comment:        Reflex urine culture not performed if WBC <=10, OR if Squamous epithelial cells >5. If Squamous epithelial cells >5 suggest recollection.    Bacteria, UA RARE (A)  NONE SEEN   Squamous Epithelial / HPF 0-5 0 - 5 /HPF   WBC Clumps PRESENT    Mucus PRESENT     Comment: Performed at [facility], [place] [address], [place], [place] [address]  Resp panel by RT-PCR (RSV, Flu A&B, Covid) Anterior Nasal Swab     Status: None   Collection Time: 12/13/22 11:00 AM   Specimen: Anterior Nasal Swab  Result Value Ref Range   SARS Coronavirus 2 by RT PCR NEGATIVE NEGATIVE    Comment: (NOTE) SARS-CoV-2 target nucleic acids are NOT DETECTED.  The SARS-CoV-2 RNA is generally detectable in upper respiratory specimens during the acute phase of infection. The lowest concentration of SARS-CoV-2 viral copies this assay can detect is 138 copies/mL. A negative result does not preclude SARS-Cov-2 infection and should not be used as the sole basis for treatment or other patient management decisions. A negative result may occur with  improper specimen collection/handling, submission of specimen other than nasopharyngeal swab, presence of viral mutation(s) within the areas targeted by this assay, and inadequate number of viral copies(<138 copies/mL). A negative result must be combined with clinical observations, patient history, and epidemiological information. The expected result is Negative.  Fact Sheet for Patients:  EntrepreneurPulse.com.au  Fact Sheet for Healthcare Providers:  IncredibleEmployment.be  This test is no t yet approved or cleared by the [place] FDA and  has been authorized for detection and/or diagnosis of SARS-CoV-2 by FDA under an Emergency Use Authorization (EUA). This EUA will remain  in effect (meaning this test can be used) for the duration of the COVID-19 declaration under Section 564(b)(1) of the Act, 21 U.S.C.section 360bbb-3(b)(1), unless the authorization is terminated  or revoked sooner.       Influenza A by PCR NEGATIVE NEGATIVE   Influenza B by PCR NEGATIVE NEGATIVE     Comment: (NOTE) The Xpert Xpress SARS-CoV-2/FLU/RSV plus assay is intended as an aid in the diagnosis of influenza from Nasopharyngeal swab specimens and should not be used as a sole basis for treatment. Nasal washings and aspirates are unacceptable for Xpert Xpress SARS-CoV-2/FLU/RSV testing.  Fact Sheet for Patients: EntrepreneurPulse.com.au  Fact Sheet for Healthcare Providers: IncredibleEmployment.be  This test is not yet approved or cleared by the [place] FDA and has been authorized for detection and/or diagnosis of SARS-CoV-2 by FDA under an Emergency Use Authorization (EUA). This EUA will remain in effect (meaning this test can be used) for the duration of the COVID-19 declaration under Section 564(b)(1) of the Act, 21 U.S.C. section 360bbb-3(b)(1), unless the authorization is terminated or revoked.     Resp Syncytial Virus by PCR NEGATIVE NEGATIVE    Comment: (NOTE) Fact Sheet for Patients: EntrepreneurPulse.com.au  Fact Sheet for Healthcare Providers: IncredibleEmployment.be  This test is not yet approved or cleared by the [place] FDA and has been authorized for detection and/or diagnosis of SARS-CoV-2 by  FDA under an Emergency Use Authorization (EUA). This EUA will remain in effect (meaning this test can be used) for the duration of the COVID-19 declaration under Section 564(b)(1) of the Act, 21 U.S.C. section 360bbb-3(b)(1), unless the authorization is terminated or revoked.  Performed at [facility], [place] [address], [place], [place] [address]   Lactic acid, plasma     Status: Abnormal   Collection Time: 12/13/22 11:00 AM  Result Value Ref Range   Lactic Acid, Venous 2.3 (HH) 0.5 - 1.9 mmol/L    Comment: CRITICAL RESULT CALLED TO, READ BACK BY AND VERIFIED WITH Latecia Miler, S. RN AT [id number] ON 12/13/2022 BY MECIAL J. Performed at [facility], [place] [address], [place], [place] [address]   Comprehensive metabolic panel     Status: Abnormal   Collection Time: 12/13/22 11:00 AM  Result Value Ref Range   Sodium 131 (L) 135 - 145 mmol/L   Potassium 4.0 3.5 - 5.1 mmol/L   Chloride 94 (L) 98 - 111 mmol/L   CO2 26 22 - 32 mmol/L   Glucose, Bld 229 (H) 70 - 99 mg/dL    Comment: Glucose reference range applies only to samples taken after fasting for at least 8 hours.   BUN 21 (H) 6 - 20 mg/dL   Creatinine, Ser 1.55 (H) 0.44 - 1.00 mg/dL   Calcium 9.0 8.9 - 10.3 mg/dL   Total Protein 7.4 6.5 - 8.1 g/dL   Albumin 3.8 3.5 - 5.0 g/dL   AST 29 15 - 41 U/L   ALT 18 0 - 44 U/L   Alkaline Phosphatase 83 38 - 126 U/L   Total Bilirubin 1.1 0.3 - 1.2 mg/dL   GFR, Estimated 41 (L) >60 mL/min    Comment: (NOTE) Calculated using the CKD-EPI Creatinine Equation (2021)    Anion gap 11 5 - 15    Comment: Performed at [facility], [place] [address], [place], [place] [address]  CBC with Differential     Status: Abnormal   Collection Time: 12/13/22 11:00 AM  Result Value Ref Range   WBC 16.3 (H) 4.0 - 10.5 K/uL   RBC 4.97 3.87 - 5.11 MIL/uL   Hemoglobin 13.2 12.0 - 15.0 g/dL   HCT 40.7 36.0 - 46.0 %   MCV 81.9 80.0 - 100.0 fL   MCH 26.6 26.0 - 34.0 pg   MCHC 32.4 30.0 - 36.0 g/dL   RDW 13.5 11.5 - 15.5 %   Platelets 185 150 - 400 K/uL   nRBC 0.0 0.0 - 0.2 %   Neutrophils Relative % 85 %   Neutro Abs 14.0 (H) 1.7 - 7.7 K/uL   Lymphocytes Relative 9 %   Lymphs Abs 1.4 0.7 - 4.0 K/uL   Monocytes Relative 4 %   Monocytes Absolute 0.6 0.1 - 1.0 K/uL   Eosinophils Relative 1 %   Eosinophils Absolute 0.1 0.0 - 0.5 K/uL   Basophils Relative 0 %   Basophils Absolute 0.1 0.0 - 0.1 K/uL   Immature Granulocytes 1 %   Abs Immature Granulocytes 0.09 (H) 0.00 - 0.07 K/uL    Comment: Performed at [facility], [place] [address], [place], [place] [address]  Protime-INR     Status: Abnormal   Collection Time: 12/13/22  11:00 AM  Result Value Ref Range   Prothrombin Time 18.6 (H) 11.4 - 15.2 seconds   INR 1.6 (H) 0.8 - 1.2    Comment: (NOTE) INR goal varies  based on device and disease states. Performed at [facility], [place] [address] [place], [place] [address]   APTT     Status: None   Collection Time: 12/13/22 11:00 AM  Result Value Ref Range   aPTT 31 24 - 36 seconds    Comment: Performed at [facility], [place] [address], [place], [place] [address]  I-Stat beta hCG blood, ED     Status: None   Collection Time: 12/13/22 11:10 AM  Result Value Ref Range   I-stat hCG, quantitative <5.0 <5 mIU/mL   Comment 3            Comment:   GEST. AGE      CONC.  (mIU/mL)   <=1 WEEK        5 - 50     2 WEEKS       50 - 500     3 WEEKS       100 - 10,000     4 WEEKS     1,000 - 30,000        FEMALE AND NON-PREGNANT FEMALE:     LESS THAN 5 mIU/mL   Lactic acid, plasma     Status: None   Collection Time: 12/13/22  2:07 PM  Result Value Ref Range   Lactic Acid, Venous 1.6 0.5 - 1.9 mmol/L    Comment: Performed at [facility], [place] [address], [place], [place] [address]   CT HEAD WO CONTRAST (5MM)  Result Date: 12/13/2022 CLINICAL DATA:  Headache.  Sudden severe. EXAM: CT HEAD WITHOUT CONTRAST TECHNIQUE: Contiguous axial images were obtained from the base of the skull through the vertex without intravenous contrast. RADIATION DOSE REDUCTION: This exam was performed according to the departmental dose-optimization program which includes automated exposure control, adjustment of the mA and/or kV according to patient size and/or use of iterative reconstruction technique. COMPARISON:  CT Head 11/09/21 FINDINGS: Brain: No evidence of acute infarction, hemorrhage, hydrocephalus, extra-axial collection or mass lesion/mass effect. Redemonstrated chronic infarct in the right basal ganglia, posterior right frontal lobe, and right parietal lobe. Vascular: No hyperdense vessel  or unexpected calcification. Skull: Normal. Negative for fracture or focal lesion. Sinuses/Orbits: No middle ear or mastoid effusion. Paranasal sinuses are clear. Bilateral orbits are unremarkable. Other: None. IMPRESSION: 1. No acute intracranial abnormality. 2. Redemonstrated chronic infarcts in the right MCA territory involving the right basal ganglia, posterior right frontal lobe, and right parietal lobe. Electronically Signed   By: Marin Roberts M.D.   On: 12/13/2022 14:13   DG Chest Port 1 View  Result Date: 12/13/2022 CLINICAL DATA:  Questionable sepsis EXAM: PORTABLE CHEST 1 VIEW COMPARISON:  Chest radiographs dated September 06, 2022 FINDINGS: The heart size and mediastinal contours are within normal limits. Both lungs are clear. The visualized skeletal structures are unremarkable. IMPRESSION: No active disease. Electronically Signed   By: Keane Police D.O.   On: 12/13/2022 11:14    Pending Labs Unresulted Labs (From admission, onward)     Start     Ordered   12/13/22 1052  Blood Culture (routine x 2)  (Septic presentation on arrival (screening labs, nursing and treatment orders for obvious sepsis))  BLOOD CULTURE X 2,   STAT      12/13/22 1052   12/13/22 1052  Urine Culture  Once,   R        12/13/22 1052            Vitals/Pain Today's Vitals  12/13/22 1045 12/13/22 1047 12/13/22 1254 12/13/22 1418  BP: 105/74     Pulse: (!) 108  88   Temp: 100.2 F (37.9 C)  100.3 F (37.9 C)   TempSrc: Oral  Rectal   SpO2: 94%  95%   PainSc:  6   3     Isolation Precautions No active isolations  Medications Medications  lactated ringers infusion ( Intravenous New Bag/Given 12/13/22 1123)  lactated ringers bolus 1,000 mL (0 mLs Intravenous Stopped 12/13/22 1418)  meropenem (MERREM) 1 g in sodium chloride 0.9 % 100 mL IVPB (0 g Intravenous Stopped 12/13/22 1152)  acetaminophen (TYLENOL) tablet 650 mg (650 mg Oral Given 12/13/22 1310)    Mobility non-ambulatory     Focused  Assessments Cardiac Assessment Handoff:    Lab Results  Component Value Date   CKTOTAL 27 (L) 08/29/2022   CKMB 0.7 12/10/2012   TROPONINI 0.03 02/27/2015   Lab Results  Component Value Date   DDIMER 0.39 09/06/2022   Does the Patient currently have chest pain? 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 Legs are better than left arm. Has a history of UTI. She uses a wheelchair because she is not ambulatory

## 2022-12-13 NOTE — Assessment & Plan Note (Signed)
Corrects to 134 for hyperglycemia Continue fluids/trend

## 2022-12-13 NOTE — Assessment & Plan Note (Signed)
On 2L PRN at SNF, stable

## 2022-12-13 NOTE — ED Triage Notes (Signed)
Pt coming from [facility] via EMS complaining of headache, fever, nausea since last night. She had some burning and increase urination.   CBG 235 O2 88% HR 114 BP 112/82 T 101.3

## 2022-12-13 NOTE — Assessment & Plan Note (Signed)
TSH in 12/2021, update pending  Continue synthroid 14mg daily

## 2022-12-13 NOTE — Assessment & Plan Note (Addendum)
52 year old female with hx of ESBL UTI presented to ED with complaints of weakness and fever found to be septic and urine as likely source -obs to progressive -bp soft, but maintaining MAP  -cultures pending -lactic acidosis resolved -continue PRN antipyretics  -continue meropenem and IVF -hx of ESBL UTI in 08/30/2022 and 09/06/2022  -trend cbc

## 2022-12-13 NOTE — Assessment & Plan Note (Signed)
Continue eliquis  ?

## 2022-12-14 ENCOUNTER — Encounter (HOSPITAL_COMMUNITY): Payer: Self-pay | Admitting: Family Medicine

## 2022-12-14 DIAGNOSIS — Z1152 Encounter for screening for COVID-19: Secondary | ICD-10-CM | POA: Diagnosis not present

## 2022-12-14 DIAGNOSIS — Z9981 Dependence on supplemental oxygen: Secondary | ICD-10-CM | POA: Diagnosis not present

## 2022-12-14 DIAGNOSIS — E6609 Other obesity due to excess calories: Secondary | ICD-10-CM | POA: Diagnosis present

## 2022-12-14 DIAGNOSIS — I129 Hypertensive chronic kidney disease with stage 1 through stage 4 chronic kidney disease, or unspecified chronic kidney disease: Secondary | ICD-10-CM | POA: Diagnosis present

## 2022-12-14 DIAGNOSIS — K219 Gastro-esophageal reflux disease without esophagitis: Secondary | ICD-10-CM | POA: Diagnosis present

## 2022-12-14 DIAGNOSIS — Z79899 Other long term (current) drug therapy: Secondary | ICD-10-CM | POA: Diagnosis not present

## 2022-12-14 DIAGNOSIS — I69354 Hemiplegia and hemiparesis following cerebral infarction affecting left non-dominant side: Secondary | ICD-10-CM | POA: Diagnosis not present

## 2022-12-14 DIAGNOSIS — I519 Heart disease, unspecified: Secondary | ICD-10-CM | POA: Diagnosis present

## 2022-12-14 DIAGNOSIS — E782 Mixed hyperlipidemia: Secondary | ICD-10-CM | POA: Diagnosis present

## 2022-12-14 DIAGNOSIS — Z794 Long term (current) use of insulin: Secondary | ICD-10-CM | POA: Diagnosis not present

## 2022-12-14 DIAGNOSIS — K5909 Other constipation: Secondary | ICD-10-CM | POA: Diagnosis present

## 2022-12-14 DIAGNOSIS — E1169 Type 2 diabetes mellitus with other specified complication: Secondary | ICD-10-CM | POA: Diagnosis present

## 2022-12-14 DIAGNOSIS — Z6833 Body mass index (BMI) 33.0-33.9, adult: Secondary | ICD-10-CM | POA: Diagnosis not present

## 2022-12-14 DIAGNOSIS — N179 Acute kidney failure, unspecified: Secondary | ICD-10-CM | POA: Diagnosis present

## 2022-12-14 DIAGNOSIS — E872 Acidosis, unspecified: Secondary | ICD-10-CM | POA: Diagnosis present

## 2022-12-14 DIAGNOSIS — Z7982 Long term (current) use of aspirin: Secondary | ICD-10-CM | POA: Diagnosis not present

## 2022-12-14 DIAGNOSIS — E039 Hypothyroidism, unspecified: Secondary | ICD-10-CM | POA: Diagnosis present

## 2022-12-14 DIAGNOSIS — E1122 Type 2 diabetes mellitus with diabetic chronic kidney disease: Secondary | ICD-10-CM | POA: Diagnosis present

## 2022-12-14 DIAGNOSIS — A419 Sepsis, unspecified organism: Secondary | ICD-10-CM | POA: Diagnosis present

## 2022-12-14 DIAGNOSIS — E871 Hypo-osmolality and hyponatremia: Secondary | ICD-10-CM | POA: Diagnosis present

## 2022-12-14 DIAGNOSIS — N390 Urinary tract infection, site not specified: Secondary | ICD-10-CM | POA: Diagnosis not present

## 2022-12-14 DIAGNOSIS — R6520 Severe sepsis without septic shock: Secondary | ICD-10-CM | POA: Diagnosis present

## 2022-12-14 DIAGNOSIS — N1831 Chronic kidney disease, stage 3a: Secondary | ICD-10-CM | POA: Diagnosis present

## 2022-12-14 DIAGNOSIS — J9611 Chronic respiratory failure with hypoxia: Secondary | ICD-10-CM | POA: Diagnosis present

## 2022-12-14 DIAGNOSIS — A4151 Sepsis due to Escherichia coli [E. coli]: Secondary | ICD-10-CM | POA: Diagnosis not present

## 2022-12-14 LAB — BASIC METABOLIC PANEL
Anion gap: 9 (ref 5–15)
BUN: 21 mg/dL — ABNORMAL HIGH (ref 6–20)
CO2: 26 mmol/L (ref 22–32)
Calcium: 8.6 mg/dL — ABNORMAL LOW (ref 8.9–10.3)
Chloride: 96 mmol/L — ABNORMAL LOW (ref 98–111)
Creatinine, Ser: 1.53 mg/dL — ABNORMAL HIGH (ref 0.44–1.00)
GFR, Estimated: 41 mL/min — ABNORMAL LOW (ref >60)
Glucose, Bld: 140 mg/dL — ABNORMAL HIGH (ref 70–99)
Potassium: 4.2 mmol/L (ref 3.5–5.1)
Sodium: 131 mmol/L — ABNORMAL LOW (ref 135–145)

## 2022-12-14 LAB — BLOOD CULTURE ID PANEL (REFLEXED) - BCID2

## 2022-12-14 LAB — GLUCOSE, CAPILLARY
Glucose-Capillary: 122 mg/dL — ABNORMAL HIGH (ref 70–99)
Glucose-Capillary: 133 mg/dL — ABNORMAL HIGH (ref 70–99)
Glucose-Capillary: 98 mg/dL (ref 70–99)
Glucose-Capillary: 126 mg/dL — ABNORMAL HIGH (ref 70–99)

## 2022-12-14 LAB — CBC
HCT: 38.5 % (ref 36.0–46.0)
Hemoglobin: 12.1 g/dL (ref 12.0–15.0)
MCH: 26.7 pg (ref 26.0–34.0)
MCHC: 31.4 g/dL (ref 30.0–36.0)
MCV: 84.8 fL (ref 80.0–100.0)
Platelets: 176 10*3/uL (ref 150–400)
RBC: 4.54 MIL/uL (ref 3.87–5.11)
RDW: 13.6 % (ref 11.5–15.5)
WBC: 12.9 10*3/uL — ABNORMAL HIGH (ref 4.0–10.5)
nRBC: 0 % (ref 0.0–0.2)

## 2022-12-14 MED ORDER — SODIUM CHLORIDE 0.9 % IV SOLN
1.0000 g | Freq: Three times a day (TID) | INTRAVENOUS | Status: DC
  Administered 2022-12-14 – 2022-12-17 (×8): 1 g via INTRAVENOUS
  Filled 2022-12-14 (×9): qty 20

## 2022-12-14 MED ORDER — HYDROCODONE-ACETAMINOPHEN 5-325 MG PO TABS
1.0000 | ORAL_TABLET | Freq: Four times a day (QID) | ORAL | Status: DC | PRN
  Administered 2022-12-14 – 2022-12-15 (×3): 1 via ORAL
  Filled 2022-12-14 (×3): qty 1

## 2022-12-14 MED ORDER — DICLOFENAC SODIUM 1 % EX GEL
2.0000 g | Freq: Four times a day (QID) | CUTANEOUS | Status: DC
  Administered 2022-12-14 – 2022-12-18 (×17): 2 g via TOPICAL
  Filled 2022-12-14: qty 100

## 2022-12-14 MED ORDER — SODIUM CHLORIDE 0.9 % IV SOLN
1.0000 g | Freq: Two times a day (BID) | INTRAVENOUS | Status: DC
  Administered 2022-12-14 (×2): 1 g via INTRAVENOUS
  Filled 2022-12-14 (×2): qty 20

## 2022-12-14 NOTE — Progress Notes (Signed)
PHARMACY - PHYSICIAN COMMUNICATION CRITICAL VALUE ALERT - BLOOD CULTURE IDENTIFICATION (BCID)  Karla Hoover is an 52 y.o. female who presented to [facility] on [facility]/13/2022 with a chief complaint of dysuria, frequency and urgency   Assessment:  1/4 ecoli, with resistance  Name of physician (or Provider) Contacted: A Chavez  Current antibiotics: merrem 1gm IV q12h  Changes to prescribed antibiotics recommended:  Patient is on recommended antibiotics - No changes needed  No results found for this or any previous visit.  Dolly Rias RPh 12/14/2022, 5:20 AM

## 2022-12-14 NOTE — Progress Notes (Signed)
   12/13/22 2035  Assess: MEWS Score  Temp (!) 102.7 F (39.3 C)  BP 138/71  MAP (mmHg) 91  Pulse Rate (!) 102  SpO2 97 %  O2 Device Nasal Cannula  O2 Flow Rate (L/min) 2 L/min  Assess: MEWS Score  MEWS Temp 2  MEWS Systolic 0  MEWS Pulse 1  MEWS RR 0  MEWS LOC 0  MEWS Score 3  MEWS Score Color Yellow  Assess: if the MEWS score is Yellow or Red  Were vital signs taken at a resting state? Yes  Focused Assessment No change from prior assessment  Does the patient meet 2 or more of the SIRS criteria? Yes  Does the patient have a confirmed or suspected source of infection? Yes  Provider and Rapid Response Notified? Yes (notified provider)  MEWS guidelines implemented  Yes, yellow  Treat  MEWS Interventions Considered administering scheduled or prn medications/treatments as ordered  Take Vital Signs  Increase Vital Sign Frequency  Yellow: Q2hr x1, continue Q4hrs until patient remains green for 12hrs  Escalate  MEWS: Escalate Yellow: Discuss with charge nurse and consider notifying provider and/or RRT  Notify: Charge Nurse/RN  Name of Charge Nurse/RN Notified Si Gaul RN  Provider Notification  Provider Name/Title Raenette Rover NP  Date Provider Notified 12/13/22  Time Provider Notified 2035  Method of Notification  (secure chat)  Notification Reason Other (Comment) (fever and  pain)  Provider response See new orders  Date of Provider Response 12/13/22  Time of Provider Response 2041  Assess: SIRS CRITERIA  SIRS Temperature  1  SIRS Pulse 1  SIRS Respirations  0  SIRS WBC 1  SIRS Score Sum  3

## 2022-12-14 NOTE — Progress Notes (Signed)
PHARMACY NOTE:  ANTIMICROBIAL RENAL DOSAGE ADJUSTMENT  Current antimicrobial regimen includes a mismatch between antimicrobial dosage and estimated renal function. As per policy approved by the Pharmacy & Therapeutics and Medical Executive Committees, the antimicrobial dosage will be adjusted accordingly.  Current antimicrobial and dosage:  Meropenem 1 g q12 hr  Indication: ESBL E coli bacteremia  Renal Function:   Estimated Creatinine Clearance: 51.4 mL/min (A) (by C-G formula based on SCr of 1.53 mg/dL (H)). '[]'$      On intermittent HD, scheduled: '[]'$      On CRRT    Antimicrobial dosage has been changed to:  1g IV q8 hr   Additional Comments: n/a   Thank you for allowing pharmacy to be a part of this patient's care.  Reuel Boom, PharmD, BCPS [phone] 12/14/2022, 5:35 PM

## 2022-12-14 NOTE — Progress Notes (Signed)
PROGRESS NOTE    Karla Hoover  [id number] DOB: 1971-07-08 DOA: 12/13/2022 PCP: Pcp, No    Brief Narrative:  Karla Hoover is a 52 y.o. female with medical history significant of COPD, chronic respiratory failure on nocturnal oxygen ,CKD stage 3a, T2DM, HTN, GERD, hx of DVT/PE, hypothyroidism, HLD, grade 1 DD,  hx of CVA with left sided deficits, recurrent ESBL UTI who presented to ED with fever and shortness of breath.  She states she couldn't sleep last night and was up every hour. She had a headache last night and felt short of breath.  She got light headed and felt like she was going to fall. This morning she was gasping for air and tells me her blood pressure was low. EMS was called. No recorded fevers at SNF, but she had chills.    Assessment and Plan: Sepsis/bacteremia due to urinary tract infection ([place])- e coli 52 year old female with hx of ESBL UTI presented to ED with complaints of weakness and fever found to be septic and urine as likely source -cultures pending -lactic acidosis resolved -continue PRN antipyretics  -continue meropenem and IVF -hx of ESBL UTI in 08/30/2022 and 09/06/2022   Hyponatremia Trending up -daily labs  Chronic respiratory failure with hypoxia (HCC) On 2L PRN at SNF, stable   COPD with chronic bronchitis (HCC) Continue symbicort, spiriva, duoneb and SABA prn   Essential hypertension Hold toprol  -resume as able  Hemiparesis affecting left side as late effect of cerebrovascular accident (CVA) ([place]) Continue medical management  ASA, statin and PRN flexeril   DM (diabetes mellitus), type 2 (HCC) A1C of 10.5 in 09/2022 -SSI for now  hx of PE/DVT Continue eliquis  Grade I diastolic dysfunction Echo 05/2021: EF of 55-60%. Normal LVF. Grade 1 DD Strict I/O and daily weights  Continue medical therapy   CKD (chronic kidney disease) stage 3, GFR 30-59 ml/min (HCC) Stable, continue to monitor   GERD without esophagitis Continue  prevacid '30mg'$  daily   Hypothyroidism TSH normal Continue synthroid 29mg daily   Obesity Estimated body mass index is 34.2 kg/m as calculated from the following:   Height as of 09/06/22: '5\' 6"'$  (1.676 m).   Weight as of this encounter: 96.1 kg.     DVT prophylaxis:  apixaban (ELIQUIS) tablet 5 mg    Code Status: Full Code Family Communication: at bedside  Disposition Plan:  Level of care: Progressive Status is: Observation The patient will require care spanning > 2 midnights and should be moved to inpatient because: sepsis/cultures +    Consultants:  none   Subjective: C/o headache  Objective: Vitals:   12/13/22 2300 12/14/22 0130 12/14/22 0535 12/14/22 0924  BP:  (!) 143/71 125/75 122/74  Pulse:  96 81 94  Resp:  (!) '22 20 18  '$ Temp: 99.6 F (37.6 C) (!) 100.5 F (38.1 C) 99.7 F (37.6 C) (!) 100.6 F (38.1 C)  TempSrc:  Oral Oral Oral  SpO2:  96% 96% 93%  Weight:        Intake/Output Summary (Last 24 hours) at 12/14/2022 1015 Last data filed at 12/14/2022 [id number] per 24 hour  Intake 2258.42 ml  Output 2150 ml  Net 108.42 ml   Filed Weights   12/13/22 1801  Weight: 96.1 kg    Examination:   General: Appearance:    Obese female in no acute distress     Lungs:     respirations unlabored  Heart:    Normal heart  rate.   MS:   All extremities are intact.    Neurologic:   Awake, alert, oriented x 3. No apparent focal neurological           defect.        Data Reviewed: I have personally reviewed following labs and imaging studies  CBC: Recent Labs  Lab 12/13/22 1100 12/14/22 0532  WBC 16.3* 12.9*  NEUTROABS 14.0*  --   HGB 13.2 12.1  HCT 40.7 38.5  MCV 81.9 84.8  PLT 185 0000000   Basic Metabolic Panel: Recent Labs  Lab 12/13/22 1100 12/14/22 0532  NA 131* 131*  K 4.0 4.2  CL 94* 96*  CO2 26 26  GLUCOSE 229* 140*  BUN 21* 21*  CREATININE 1.55* 1.53*  CALCIUM 9.0 8.6*   GFR: Estimated Creatinine Clearance: 51.4 mL/min (A) (by  C-G formula based on SCr of 1.53 mg/dL (H)). Liver Function Tests: Recent Labs  Lab 12/13/22 1100  AST 29  ALT 18  ALKPHOS 83  BILITOT 1.1  PROT 7.4  ALBUMIN 3.8   No results for input(s): "LIPASE", "AMYLASE" in the last 168 hours. No results for input(s): "AMMONIA" in the last 168 hours. Coagulation Profile: Recent Labs  Lab 12/13/22 1100  INR 1.6*   Cardiac Enzymes: No results for input(s): "CKTOTAL", "CKMB", "CKMBINDEX", "TROPONINI" in the last 168 hours. BNP (last 3 results) No results for input(s): "PROBNP" in the last 8760 hours. HbA1C: No results for input(s): "HGBA1C" in the last 72 hours. CBG: Recent Labs  Lab 12/13/22 1657 12/13/22 2245 12/14/22 0720  GLUCAP 145* 203* 122*   Lipid Profile: No results for input(s): "CHOL", "HDL", "LDLCALC", "TRIG", "CHOLHDL", "LDLDIRECT" in the last 72 hours. Thyroid Function Tests: Recent Labs    12/13/22 1701  TSH 1.921   Anemia Panel: No results for input(s): "VITAMINB12", "FOLATE", "FERRITIN", "TIBC", "IRON", "RETICCTPCT" in the last 72 hours. Sepsis Labs: Recent Labs  Lab 12/13/22 1100 12/13/22 1407  LATICACIDVEN 2.3* 1.6    Recent Results (from the past 240 hour(s))  Blood Culture (routine x 2)     Status: None (Preliminary result)   Collection Time: 12/13/22 10:52 AM   Specimen: BLOOD  Result Value Ref Range Status   Specimen Description   Final    BLOOD BLOOD LEFT ARM Performed at [facility] [address] [place], [place] [address]    Special Requests   Final    BOTTLES DRAWN AEROBIC AND ANAEROBIC Blood Culture results may not be optimal due to an excessive volume of blood received in culture bottles Performed at [facility] [address] [place], [place] [address]    Culture   Final    NO GROWTH < 24 HOURS Performed at [facility] [address] [place], [place] [address]    Report Status PENDING  Incomplete  Blood Culture (routine x 2)      Status: None (Preliminary result)   Collection Time: 12/13/22 10:57 AM   Specimen: BLOOD  Result Value Ref Range Status   Specimen Description   Final    BLOOD RFOA Performed at [facility] [address], [place], [place] [address]    Special Requests   Final    BOTTLES DRAWN AEROBIC AND ANAEROBIC Blood Culture adequate volume Performed at [facility] [address], [place], [place] [address]    Culture  Setup Time   Final    GRAM NEGATIVE RODS ANAEROBIC BOTTLE ONLY CRITICAL  RESULT CALLED TO, READ BACK BY AND VERIFIED WITH: PHARMD E. JACKSON 12/14/22 @ 0517 BY AB Performed at [facility], [place] [address], [place], [place] [address]    Culture GRAM NEGATIVE RODS  Final   Report Status PENDING  Incomplete  Blood Culture ID Panel (Reflexed)     Status: Abnormal   Collection Time: 12/13/22 10:57 AM  Result Value Ref Range Status   Enterococcus faecalis NOT DETECTED NOT DETECTED Final   Enterococcus Faecium NOT DETECTED NOT DETECTED Final   Listeria monocytogenes NOT DETECTED NOT DETECTED Final   Staphylococcus species NOT DETECTED NOT DETECTED Final   Staphylococcus aureus (BCID) NOT DETECTED NOT DETECTED Final   Staphylococcus epidermidis NOT DETECTED NOT DETECTED Final   Staphylococcus lugdunensis NOT DETECTED NOT DETECTED Final   Streptococcus species NOT DETECTED NOT DETECTED Final   Streptococcus agalactiae NOT DETECTED NOT DETECTED Final   Streptococcus pneumoniae NOT DETECTED NOT DETECTED Final   Streptococcus pyogenes NOT DETECTED NOT DETECTED Final   A.calcoaceticus-baumannii NOT DETECTED NOT DETECTED Final   Bacteroides fragilis NOT DETECTED NOT DETECTED Final   Enterobacterales DETECTED (A) NOT DETECTED Final    Comment: Enterobacterales represent a large order of gram negative bacteria, not a single organism. CRITICAL RESULT CALLED TO, READ BACK BY AND VERIFIED WITH: PHARMD E. JACKSON 12/14/22 @ 0517 BY AB    Enterobacter  cloacae complex NOT DETECTED NOT DETECTED Final   Escherichia coli DETECTED (A) NOT DETECTED Final    Comment: CRITICAL RESULT CALLED TO, READ BACK BY AND VERIFIED WITH: PHARMD E. JACKSON 12/14/22 @ 0517 BY AB    Klebsiella aerogenes NOT DETECTED NOT DETECTED Final   Klebsiella oxytoca NOT DETECTED NOT DETECTED Final   Klebsiella pneumoniae NOT DETECTED NOT DETECTED Final   Proteus species NOT DETECTED NOT DETECTED Final   Salmonella species NOT DETECTED NOT DETECTED Final   Serratia marcescens NOT DETECTED NOT DETECTED Final   Haemophilus influenzae NOT DETECTED NOT DETECTED Final   Neisseria meningitidis NOT DETECTED NOT DETECTED Final   Pseudomonas aeruginosa NOT DETECTED NOT DETECTED Final   Stenotrophomonas maltophilia NOT DETECTED NOT DETECTED Final   Candida albicans NOT DETECTED NOT DETECTED Final   Candida auris NOT DETECTED NOT DETECTED Final   Candida glabrata NOT DETECTED NOT DETECTED Final   Candida krusei NOT DETECTED NOT DETECTED Final   Candida parapsilosis NOT DETECTED NOT DETECTED Final   Candida tropicalis NOT DETECTED NOT DETECTED Final   Cryptococcus neoformans/gattii NOT DETECTED NOT DETECTED Final   CTX-M ESBL DETECTED (A) NOT DETECTED Final    Comment: CRITICAL RESULT CALLED TO, READ BACK BY AND VERIFIED WITH: PHARMD E. JACKSON 12/14/22 @ 0517 BY AB (NOTE) Extended spectrum beta-lactamase detected. Recommend a carbapenem as initial therapy.      Carbapenem resistance IMP NOT DETECTED NOT DETECTED Final   Carbapenem resistance KPC NOT DETECTED NOT DETECTED Final   Carbapenem resistance NDM NOT DETECTED NOT DETECTED Final   Carbapenem resist OXA 48 LIKE NOT DETECTED NOT DETECTED Final   Carbapenem resistance VIM NOT DETECTED NOT DETECTED Final    Comment: Performed at [facility], [place] [address], [place], [place] [address]  Resp panel by RT-PCR (RSV, Flu A&B, Covid) Anterior Nasal Swab     Status: None   Collection Time: 12/13/22 11:00 AM   Specimen:  Anterior Nasal Swab  Result Value Ref Range Status   SARS Coronavirus 2 by RT PCR NEGATIVE NEGATIVE Final    Comment: (NOTE) SARS-CoV-2 target nucleic acids  are NOT DETECTED.  The SARS-CoV-2 RNA is generally detectable in upper respiratory specimens during the acute phase of infection. The lowest concentration of SARS-CoV-2 viral copies this assay can detect is 138 copies/mL. A negative result does not preclude SARS-Cov-2 infection and should not be used as the sole basis for treatment or other patient management decisions. A negative result may occur with  improper specimen collection/handling, submission of specimen other than nasopharyngeal swab, presence of viral mutation(s) within the areas targeted by this assay, and inadequate number of viral copies(<138 copies/mL). A negative result must be combined with clinical observations, patient history, and epidemiological information. The expected result is Negative.  Fact Sheet for Patients:  EntrepreneurPulse.com.au  Fact Sheet for Healthcare Providers:  IncredibleEmployment.be  This test is no t yet approved or cleared by the [place] FDA and  has been authorized for detection and/or diagnosis of SARS-CoV-2 by FDA under an Emergency Use Authorization (EUA). This EUA will remain  in effect (meaning this test can be used) for the duration of the COVID-19 declaration under Section 564(b)(1) of the Act, 21 U.S.C.section 360bbb-3(b)(1), unless the authorization is terminated  or revoked sooner.       Influenza A by PCR NEGATIVE NEGATIVE Final   Influenza B by PCR NEGATIVE NEGATIVE Final    Comment: (NOTE) The Xpert Xpress SARS-CoV-2/FLU/RSV plus assay is intended as an aid in the diagnosis of influenza from Nasopharyngeal swab specimens and should not be used as a sole basis for treatment. Nasal washings and aspirates are unacceptable for Xpert Xpress SARS-CoV-2/FLU/RSV testing.  Fact  Sheet for Patients: EntrepreneurPulse.com.au  Fact Sheet for Healthcare Providers: IncredibleEmployment.be  This test is not yet approved or cleared by the [place] FDA and has been authorized for detection and/or diagnosis of SARS-CoV-2 by FDA under an Emergency Use Authorization (EUA). This EUA will remain in effect (meaning this test can be used) for the duration of the COVID-19 declaration under Section 564(b)(1) of the Act, 21 U.S.C. section 360bbb-3(b)(1), unless the authorization is terminated or revoked.     Resp Syncytial Virus by PCR NEGATIVE NEGATIVE Final    Comment: (NOTE) Fact Sheet for Patients: EntrepreneurPulse.com.au  Fact Sheet for Healthcare Providers: IncredibleEmployment.be  This test is not yet approved or cleared by the [place] FDA and has been authorized for detection and/or diagnosis of SARS-CoV-2 by FDA under an Emergency Use Authorization (EUA). This EUA will remain in effect (meaning this test can be used) for the duration of the COVID-19 declaration under Section 564(b)(1) of the Act, 21 U.S.C. section 360bbb-3(b)(1), unless the authorization is terminated or revoked.  Performed at [facility], [place] [address], [place], [place] [address]          Radiology Studies: CT HEAD WO CONTRAST (5MM)  Result Date: 12/13/2022 CLINICAL DATA:  Headache.  Sudden severe. EXAM: CT HEAD WITHOUT CONTRAST TECHNIQUE: Contiguous axial images were obtained from the base of the skull through the vertex without intravenous contrast. RADIATION DOSE REDUCTION: This exam was performed according to the departmental dose-optimization program which includes automated exposure control, adjustment of the mA and/or kV according to patient size and/or use of iterative reconstruction technique. COMPARISON:  CT Head 11/09/21 FINDINGS: Brain: No evidence of acute infarction,  hemorrhage, hydrocephalus, extra-axial collection or mass lesion/mass effect. Redemonstrated chronic infarct in the right basal ganglia, posterior right frontal lobe, and right parietal lobe. Vascular: No hyperdense vessel or unexpected calcification. Skull: Normal. Negative for fracture or focal lesion. Sinuses/Orbits: No middle  ear or mastoid effusion. Paranasal sinuses are clear. Bilateral orbits are unremarkable. Other: None. IMPRESSION: 1. No acute intracranial abnormality. 2. Redemonstrated chronic infarcts in the right MCA territory involving the right basal ganglia, posterior right frontal lobe, and right parietal lobe. Electronically Signed   By: Marin Roberts M.D.   On: 12/13/2022 14:13   DG Chest Port 1 View  Result Date: 12/13/2022 CLINICAL DATA:  Questionable sepsis EXAM: PORTABLE CHEST 1 VIEW COMPARISON:  Chest radiographs dated September 06, 2022 FINDINGS: The heart size and mediastinal contours are within normal limits. Both lungs are clear. The visualized skeletal structures are unremarkable. IMPRESSION: No active disease. Electronically Signed   By: Keane Police D.O.   On: 12/13/2022 11:14        Scheduled Meds:  apixaban  5 mg Oral BID   aspirin  81 mg Oral q AM   diclofenac Sodium  2 g Topical QID   insulin aspart  0-9 Units Subcutaneous TID WC   insulin glargine-yfgn  10 Units Subcutaneous BID   mometasone-formoterol  2 puff Inhalation BID   umeclidinium bromide  1 puff Inhalation Daily   Continuous Infusions:  meropenem (MERREM) IV 1 g (12/14/22 0541)     LOS: 0 days    Time spent: 45 minutes spent on chart review, discussion with nursing staff, consultants, updating family and interview/physical exam; more than 50% of that time was spent in counseling and/or coordination of care.    Geradine Girt, DO [facility] Available via Epic secure chat 7am-7pm After these hours, please refer to coverage provider listed on [email or web address] 12/14/2022, 10:15 AM

## 2022-12-14 NOTE — Plan of Care (Signed)
  Problem: Elimination: Goal: Will not experience complications related to bowel motility Outcome: Progressing Goal: Will not experience complications related to urinary retention Outcome: Progressing   Problem: Pain Managment: Goal: General experience of comfort will improve Outcome: Progressing   

## 2022-12-15 DIAGNOSIS — A419 Sepsis, unspecified organism: Secondary | ICD-10-CM | POA: Diagnosis not present

## 2022-12-15 DIAGNOSIS — N390 Urinary tract infection, site not specified: Secondary | ICD-10-CM | POA: Diagnosis not present

## 2022-12-15 LAB — CBC
HCT: 40.8 % (ref 36.0–46.0)
Hemoglobin: 12.7 g/dL (ref 12.0–15.0)
MCH: 26.3 pg (ref 26.0–34.0)
MCHC: 31.1 g/dL (ref 30.0–36.0)
MCV: 84.5 fL (ref 80.0–100.0)
Platelets: 166 10*3/uL (ref 150–400)
RBC: 4.83 MIL/uL (ref 3.87–5.11)
RDW: 13.5 % (ref 11.5–15.5)
WBC: 7.4 10*3/uL (ref 4.0–10.5)
nRBC: 0 % (ref 0.0–0.2)

## 2022-12-15 LAB — GLUCOSE, CAPILLARY
Glucose-Capillary: 151 mg/dL — ABNORMAL HIGH (ref 70–99)
Glucose-Capillary: 160 mg/dL — ABNORMAL HIGH (ref 70–99)
Glucose-Capillary: 178 mg/dL — ABNORMAL HIGH (ref 70–99)
Glucose-Capillary: 185 mg/dL — ABNORMAL HIGH (ref 70–99)

## 2022-12-15 LAB — URINE CULTURE: Culture: NO GROWTH

## 2022-12-15 LAB — BASIC METABOLIC PANEL
Anion gap: 13 (ref 5–15)
BUN: 20 mg/dL (ref 6–20)
CO2: 26 mmol/L (ref 22–32)
Calcium: 9.3 mg/dL (ref 8.9–10.3)
Chloride: 95 mmol/L — ABNORMAL LOW (ref 98–111)
Creatinine, Ser: 1.15 mg/dL — ABNORMAL HIGH (ref 0.44–1.00)
GFR, Estimated: 58 mL/min — ABNORMAL LOW (ref >60)
Glucose, Bld: 148 mg/dL — ABNORMAL HIGH (ref 70–99)
Potassium: 4.3 mmol/L (ref 3.5–5.1)
Sodium: 134 mmol/L — ABNORMAL LOW (ref 135–145)

## 2022-12-15 MED ORDER — RISAQUAD PO CAPS
1.0000 | ORAL_CAPSULE | Freq: Every day | ORAL | Status: DC
  Administered 2022-12-15 – 2022-12-18 (×4): 1 via ORAL
  Filled 2022-12-15 (×4): qty 1

## 2022-12-15 MED ORDER — LEVOTHYROXINE SODIUM 75 MCG PO TABS
75.0000 ug | ORAL_TABLET | Freq: Every day | ORAL | Status: DC
  Administered 2022-12-16 – 2022-12-18 (×3): 75 ug via ORAL
  Filled 2022-12-15 (×3): qty 1

## 2022-12-15 MED ORDER — MELATONIN 5 MG PO TABS
5.0000 mg | ORAL_TABLET | Freq: Every day | ORAL | Status: DC
  Administered 2022-12-15 – 2022-12-17 (×3): 5 mg via ORAL
  Filled 2022-12-15 (×3): qty 1

## 2022-12-15 MED ORDER — HYDROCODONE-ACETAMINOPHEN 5-325 MG PO TABS
1.0000 | ORAL_TABLET | ORAL | Status: DC | PRN
  Administered 2022-12-15: 1 via ORAL
  Administered 2022-12-15: 2 via ORAL
  Administered 2022-12-15: 1 via ORAL
  Administered 2022-12-16 – 2022-12-17 (×7): 2 via ORAL
  Administered 2022-12-17: 1 via ORAL
  Administered 2022-12-18: 2 via ORAL
  Filled 2022-12-15 (×5): qty 2
  Filled 2022-12-15: qty 1
  Filled 2022-12-15: qty 2
  Filled 2022-12-15: qty 1
  Filled 2022-12-15 (×4): qty 2

## 2022-12-15 MED ORDER — MAGNESIUM OXIDE -MG SUPPLEMENT 400 (240 MG) MG PO TABS
400.0000 mg | ORAL_TABLET | Freq: Every day | ORAL | Status: DC
  Administered 2022-12-16 – 2022-12-18 (×3): 400 mg via ORAL
  Filled 2022-12-15 (×3): qty 1

## 2022-12-15 NOTE — Plan of Care (Signed)
  Problem: Safety: Goal: Ability to remain free from injury will improve Outcome: Progressing   Problem: Pain Managment: Goal: General experience of comfort will improve Outcome: Progressing   Problem: Coping: Goal: Level of anxiety will decrease Outcome: Progressing   Problem: Skin Integrity: Goal: Risk for impaired skin integrity will decrease Outcome: Progressing   

## 2022-12-15 NOTE — Progress Notes (Signed)
PROGRESS NOTE    Karla Hoover  [id number] DOB: 27-Jul-1971 DOA: 12/13/2022 PCP: Pcp, No    Brief Narrative:  Karla Hoover is a 52 y.o. female with medical history significant of COPD, chronic respiratory failure on nocturnal oxygen ,CKD stage 3a, T2DM, HTN, GERD, hx of DVT/PE, hypothyroidism, HLD, grade 1 DD,  hx of CVA with left sided deficits, recurrent ESBL UTI who presented to ED with fever and shortness of breath.  She states she couldn't sleep last night and was up every hour. She had a headache last night and felt short of breath.  She got light headed and felt like she was going to fall. This morning she was gasping for air and tells me her blood pressure was low. EMS was called. No recorded fevers at SNF, but she had chills.    Assessment and Plan: Sepsis/bacteremia due to urinary tract infection ([place])- e coli 52 year old female with hx of ESBL UTI presented to ED with complaints of weakness and fever found to be septic and urine as likely source for bacteremia -final cultures pending -lactic acidosis resolved -continue PRN antipyretics  -continue meropenem and IVF -hx of ESBL UTI in 08/30/2022 and 09/06/2022   Hyponatremia Trending up -daily labs  Chronic respiratory failure with hypoxia (HCC) On 2L PRN at SNF, stable   COPD with chronic bronchitis (HCC) Continue symbicort, spiriva, duoneb and SABA prn   Essential hypertension Hold toprol  -resume as able  Hemiparesis affecting left side as late effect of cerebrovascular accident (CVA) ([place]) Continue medical management  ASA, statin and PRN flexeril   DM (diabetes mellitus), type 2 (HCC) A1C of 10.5 in 09/2022 -SSI for now  hx of PE/DVT Continue eliquis  Grade I diastolic dysfunction Echo 05/2021: EF of 55-60%. Normal LVF. Grade 1 DD Strict I/O and daily weights  Continue medical therapy   CKD (chronic kidney disease) stage 3, GFR 30-59 ml/min (HCC) Stable, continue to monitor   GERD without  esophagitis Continue prevacid '30mg'$  daily   Hypothyroidism TSH normal Continue synthroid 48mg daily   Obesity Estimated body mass index is 33.55 kg/m as calculated from the following:   Height as of this encounter: '5\' 6"'$  (1.676 m).   Weight as of this encounter: 94.3 kg.     DVT prophylaxis:  apixaban (ELIQUIS) tablet 5 mg    Code Status: Full Code Family Communication: at bedside  Disposition Plan:  Level of care: Progressive inpt    Consultants:  none   Subjective: Still with back pain  Objective: Vitals:   12/15/22 0500 12/15/22 0534 12/15/22 0814 12/15/22 0853  BP:  129/68    Pulse:  73  80  Resp:  17  18  Temp:  98 F (36.7 C)    TempSrc:  Oral    SpO2:  94%  94%  Weight: 94.3 kg     Height:   '5\' 6"'$  (1.676 m)     Intake/Output Summary (Last 24 hours) at 12/15/2022 1027 Last data filed at 12/15/2022 0955 Gross per 24 hour  Intake 1660 ml  Output 1300 ml  Net 360 ml   Filed Weights   12/13/22 1801 12/15/22 0500  Weight: 96.1 kg 94.3 kg    Examination:   General: Appearance:    Obese female in no acute distress- sitting in chair     Lungs:     respirations unlabored  Heart:    Normal heart rate.   MS:   All extremities are intact.  Neurologic:   Awake, alert       Data Reviewed: I have personally reviewed following labs and imaging studies  CBC: Recent Labs  Lab 12/13/22 1100 12/14/22 0532 12/15/22 0841  WBC 16.3* 12.9* 7.4  NEUTROABS 14.0*  --   --   HGB 13.2 12.1 12.7  HCT 40.7 38.5 40.8  MCV 81.9 84.8 84.5  PLT 185 176 [id number]   Basic Metabolic Panel: Recent Labs  Lab 12/13/22 1100 12/14/22 0532 12/15/22 0841  NA 131* 131* 134*  K 4.0 4.2 4.3  CL 94* 96* 95*  CO2 '26 26 26  '$ GLUCOSE 229* 140* 148*  BUN 21* 21* 20  CREATININE 1.55* 1.53* 1.15*  CALCIUM 9.0 8.6* 9.3   GFR: Estimated Creatinine Clearance: 67.7 mL/min (A) (by C-G formula based on SCr of 1.15 mg/dL (H)). Liver Function Tests: Recent Labs  Lab  12/13/22 1100  AST 29  ALT 18  ALKPHOS 83  BILITOT 1.1  PROT 7.4  ALBUMIN 3.8   No results for input(s): "LIPASE", "AMYLASE" in the last 168 hours. No results for input(s): "AMMONIA" in the last 168 hours. Coagulation Profile: Recent Labs  Lab 12/13/22 1100  INR 1.6*   Cardiac Enzymes: No results for input(s): "CKTOTAL", "CKMB", "CKMBINDEX", "TROPONINI" in the last 168 hours. BNP (last 3 results) No results for input(s): "PROBNP" in the last 8760 hours. HbA1C: No results for input(s): "HGBA1C" in the last 72 hours. CBG: Recent Labs  Lab 12/14/22 0720 12/14/22 1128 12/14/22 1708 12/14/22 2052 12/15/22 0811  GLUCAP 122* 133* 98 126* 151*   Lipid Profile: No results for input(s): "CHOL", "HDL", "LDLCALC", "TRIG", "CHOLHDL", "LDLDIRECT" in the last 72 hours. Thyroid Function Tests: Recent Labs    12/13/22 1701  TSH 1.921   Anemia Panel: No results for input(s): "VITAMINB12", "FOLATE", "FERRITIN", "TIBC", "IRON", "RETICCTPCT" in the last 72 hours. Sepsis Labs: Recent Labs  Lab 12/13/22 1100 12/13/22 1407  LATICACIDVEN 2.3* 1.6    Recent Results (from the past 240 hour(s))  Blood Culture (routine x 2)     Status: None (Preliminary result)   Collection Time: 12/13/22 10:52 AM   Specimen: BLOOD  Result Value Ref Range Status   Specimen Description   Final    BLOOD BLOOD LEFT ARM Performed at [facility] [address], [place], [place] [address]    Special Requests   Final    BOTTLES DRAWN AEROBIC AND ANAEROBIC Blood Culture results may not be optimal due to an excessive volume of blood received in culture bottles Performed at [facility] [address], [place], [place] [address]    Culture   Final    NO GROWTH 2 DAYS Performed at [facility] [address], [place], [place] [address]    Report Status PENDING  Incomplete  Urine Culture     Status: None (Preliminary result)   Collection Time: 12/13/22  10:52 AM   Specimen: Urine, Random  Result Value Ref Range Status   Specimen Description   Final    URINE, RANDOM Performed at [facility] [address], [place], [place] [address]    Special Requests   Final    URINE, CLEAN CATCH Performed at [facility], [place] [address], [place], [place] [address]    Culture PENDING  Incomplete   Report Status PENDING  Incomplete  Blood Culture (routine x 2)     Status: Abnormal (Preliminary result)   Collection Time: 12/13/22 10:57 AM  Specimen: BLOOD  Result Value Ref Range Status   Specimen Description   Final    BLOOD RFOA Performed at [facility] [address] [place], [place] [address]    Special Requests   Final    BOTTLES DRAWN AEROBIC AND ANAEROBIC Blood Culture adequate volume Performed at [facility] [address], [place], [place] [address]    Culture  Setup Time   Final    GRAM NEGATIVE RODS ANAEROBIC BOTTLE ONLY CRITICAL RESULT CALLED TO, READ BACK BY AND VERIFIED WITH: PHARMD E. JACKSON 12/14/22 @ 0517 BY AB    Culture (A)  Final    ESCHERICHIA COLI SUSCEPTIBILITIES TO FOLLOW Performed at [facility], [place] [address], [place], [place] [address]    Report Status PENDING  Incomplete  Blood Culture ID Panel (Reflexed)     Status: Abnormal   Collection Time: 12/13/22 10:57 AM  Result Value Ref Range Status   Enterococcus faecalis NOT DETECTED NOT DETECTED Final   Enterococcus Faecium NOT DETECTED NOT DETECTED Final   Listeria monocytogenes NOT DETECTED NOT DETECTED Final   Staphylococcus species NOT DETECTED NOT DETECTED Final   Staphylococcus aureus (BCID) NOT DETECTED NOT DETECTED Final   Staphylococcus epidermidis NOT DETECTED NOT DETECTED Final   Staphylococcus lugdunensis NOT DETECTED NOT DETECTED Final   Streptococcus species NOT DETECTED NOT DETECTED Final   Streptococcus agalactiae NOT DETECTED NOT DETECTED Final   Streptococcus  pneumoniae NOT DETECTED NOT DETECTED Final   Streptococcus pyogenes NOT DETECTED NOT DETECTED Final   A.calcoaceticus-baumannii NOT DETECTED NOT DETECTED Final   Bacteroides fragilis NOT DETECTED NOT DETECTED Final   Enterobacterales DETECTED (A) NOT DETECTED Final    Comment: Enterobacterales represent a large order of gram negative bacteria, not a single organism. CRITICAL RESULT CALLED TO, READ BACK BY AND VERIFIED WITH: PHARMD E. JACKSON 12/14/22 @ 0517 BY AB    Enterobacter cloacae complex NOT DETECTED NOT DETECTED Final   Escherichia coli DETECTED (A) NOT DETECTED Final    Comment: CRITICAL RESULT CALLED TO, READ BACK BY AND VERIFIED WITH: PHARMD E. JACKSON 12/14/22 @ 0517 BY AB    Klebsiella aerogenes NOT DETECTED NOT DETECTED Final   Klebsiella oxytoca NOT DETECTED NOT DETECTED Final   Klebsiella pneumoniae NOT DETECTED NOT DETECTED Final   Proteus species NOT DETECTED NOT DETECTED Final   Salmonella species NOT DETECTED NOT DETECTED Final   Serratia marcescens NOT DETECTED NOT DETECTED Final   Haemophilus influenzae NOT DETECTED NOT DETECTED Final   Neisseria meningitidis NOT DETECTED NOT DETECTED Final   Pseudomonas aeruginosa NOT DETECTED NOT DETECTED Final   Stenotrophomonas maltophilia NOT DETECTED NOT DETECTED Final   Candida albicans NOT DETECTED NOT DETECTED Final   Candida auris NOT DETECTED NOT DETECTED Final   Candida glabrata NOT DETECTED NOT DETECTED Final   Candida krusei NOT DETECTED NOT DETECTED Final   Candida parapsilosis NOT DETECTED NOT DETECTED Final   Candida tropicalis NOT DETECTED NOT DETECTED Final   Cryptococcus neoformans/gattii NOT DETECTED NOT DETECTED Final   CTX-M ESBL DETECTED (A) NOT DETECTED Final    Comment: CRITICAL RESULT CALLED TO, READ BACK BY AND VERIFIED WITH: PHARMD E. JACKSON 12/14/22 @ 0517 BY AB (NOTE) Extended spectrum beta-lactamase detected. Recommend a carbapenem as initial therapy.      Carbapenem resistance IMP NOT DETECTED  NOT DETECTED Final   Carbapenem resistance KPC NOT DETECTED NOT DETECTED Final   Carbapenem resistance NDM NOT DETECTED NOT DETECTED Final   Carbapenem  resist OXA 48 LIKE NOT DETECTED NOT DETECTED Final   Carbapenem resistance VIM NOT DETECTED NOT DETECTED Final    Comment: Performed at [facility], [place] [address], [place], [place] [address]  Resp panel by RT-PCR (RSV, Flu A&B, Covid) Anterior Nasal Swab     Status: None   Collection Time: 12/13/22 11:00 AM   Specimen: Anterior Nasal Swab  Result Value Ref Range Status   SARS Coronavirus 2 by RT PCR NEGATIVE NEGATIVE Final    Comment: (NOTE) SARS-CoV-2 target nucleic acids are NOT DETECTED.  The SARS-CoV-2 RNA is generally detectable in upper respiratory specimens during the acute phase of infection. The lowest concentration of SARS-CoV-2 viral copies this assay can detect is 138 copies/mL. A negative result does not preclude SARS-Cov-2 infection and should not be used as the sole basis for treatment or other patient management decisions. A negative result may occur with  improper specimen collection/handling, submission of specimen other than nasopharyngeal swab, presence of viral mutation(s) within the areas targeted by this assay, and inadequate number of viral copies(<138 copies/mL). A negative result must be combined with clinical observations, patient history, and epidemiological information. The expected result is Negative.  Fact Sheet for Patients:  EntrepreneurPulse.com.au  Fact Sheet for Healthcare Providers:  IncredibleEmployment.be  This test is no t yet approved or cleared by the [place] FDA and  has been authorized for detection and/or diagnosis of SARS-CoV-2 by FDA under an Emergency Use Authorization (EUA). This EUA will remain  in effect (meaning this test can be used) for the duration of the COVID-19 declaration under Section 564(b)(1) of the Act, 21 U.S.C.section  360bbb-3(b)(1), unless the authorization is terminated  or revoked sooner.       Influenza A by PCR NEGATIVE NEGATIVE Final   Influenza B by PCR NEGATIVE NEGATIVE Final    Comment: (NOTE) The Xpert Xpress SARS-CoV-2/FLU/RSV plus assay is intended as an aid in the diagnosis of influenza from Nasopharyngeal swab specimens and should not be used as a sole basis for treatment. Nasal washings and aspirates are unacceptable for Xpert Xpress SARS-CoV-2/FLU/RSV testing.  Fact Sheet for Patients: EntrepreneurPulse.com.au  Fact Sheet for Healthcare Providers: IncredibleEmployment.be  This test is not yet approved or cleared by the [place] FDA and has been authorized for detection and/or diagnosis of SARS-CoV-2 by FDA under an Emergency Use Authorization (EUA). This EUA will remain in effect (meaning this test can be used) for the duration of the COVID-19 declaration under Section 564(b)(1) of the Act, 21 U.S.C. section 360bbb-3(b)(1), unless the authorization is terminated or revoked.     Resp Syncytial Virus by PCR NEGATIVE NEGATIVE Final    Comment: (NOTE) Fact Sheet for Patients: EntrepreneurPulse.com.au  Fact Sheet for Healthcare Providers: IncredibleEmployment.be  This test is not yet approved or cleared by the [place] FDA and has been authorized for detection and/or diagnosis of SARS-CoV-2 by FDA under an Emergency Use Authorization (EUA). This EUA will remain in effect (meaning this test can be used) for the duration of the COVID-19 declaration under Section 564(b)(1) of the Act, 21 U.S.C. section 360bbb-3(b)(1), unless the authorization is terminated or revoked.  Performed at [facility], [place] [address], [place], [place] [address]          Radiology Studies: CT HEAD WO CONTRAST (5MM)  Result Date: 12/13/2022 CLINICAL DATA:  Headache.  Sudden severe. EXAM: CT HEAD  WITHOUT CONTRAST TECHNIQUE: Contiguous axial images were obtained from the base of the skull through  the vertex without intravenous contrast. RADIATION DOSE REDUCTION: This exam was performed according to the departmental dose-optimization program which includes automated exposure control, adjustment of the mA and/or kV according to patient size and/or use of iterative reconstruction technique. COMPARISON:  CT Head 11/09/21 FINDINGS: Brain: No evidence of acute infarction, hemorrhage, hydrocephalus, extra-axial collection or mass lesion/mass effect. Redemonstrated chronic infarct in the right basal ganglia, posterior right frontal lobe, and right parietal lobe. Vascular: No hyperdense vessel or unexpected calcification. Skull: Normal. Negative for fracture or focal lesion. Sinuses/Orbits: No middle ear or mastoid effusion. Paranasal sinuses are clear. Bilateral orbits are unremarkable. Other: None. IMPRESSION: 1. No acute intracranial abnormality. 2. Redemonstrated chronic infarcts in the right MCA territory involving the right basal ganglia, posterior right frontal lobe, and right parietal lobe. Electronically Signed   By: Marin Roberts M.D.   On: 12/13/2022 14:13   DG Chest Port 1 View  Result Date: 12/13/2022 CLINICAL DATA:  Questionable sepsis EXAM: PORTABLE CHEST 1 VIEW COMPARISON:  Chest radiographs dated September 06, 2022 FINDINGS: The heart size and mediastinal contours are within normal limits. Both lungs are clear. The visualized skeletal structures are unremarkable. IMPRESSION: No active disease. Electronically Signed   By: Keane Police D.O.   On: 12/13/2022 11:14        Scheduled Meds:  acidophilus  1 capsule Oral Daily   apixaban  5 mg Oral BID   aspirin  81 mg Oral q AM   diclofenac Sodium  2 g Topical QID   insulin aspart  0-9 Units Subcutaneous TID WC   insulin glargine-yfgn  10 Units Subcutaneous BID   mometasone-formoterol  2 puff Inhalation BID   umeclidinium bromide  1 puff  Inhalation Daily   Continuous Infusions:  meropenem (MERREM) IV 1 g (12/15/22 0626)     LOS: 1 day    Time spent: 45 minutes spent on chart review, discussion with nursing staff, consultants, updating family and interview/physical exam; more than 50% of that time was spent in counseling and/or coordination of care.    Geradine Girt, DO [facility] Available via Epic secure chat 7am-7pm After these hours, please refer to coverage provider listed on [email or web address] 12/15/2022, 10:27 AM

## 2022-12-16 DIAGNOSIS — N390 Urinary tract infection, site not specified: Secondary | ICD-10-CM | POA: Diagnosis not present

## 2022-12-16 DIAGNOSIS — A419 Sepsis, unspecified organism: Secondary | ICD-10-CM | POA: Diagnosis not present

## 2022-12-16 LAB — BASIC METABOLIC PANEL
Anion gap: 11 (ref 5–15)
BUN: 18 mg/dL (ref 6–20)
CO2: 25 mmol/L (ref 22–32)
Calcium: 9.2 mg/dL (ref 8.9–10.3)
Chloride: 96 mmol/L — ABNORMAL LOW (ref 98–111)
Creatinine, Ser: 1.19 mg/dL — ABNORMAL HIGH (ref 0.44–1.00)
GFR, Estimated: 56 mL/min — ABNORMAL LOW (ref >60)
Glucose, Bld: 167 mg/dL — ABNORMAL HIGH (ref 70–99)
Potassium: 4.1 mmol/L (ref 3.5–5.1)
Sodium: 132 mmol/L — ABNORMAL LOW (ref 135–145)

## 2022-12-16 LAB — CBC
HCT: 40.4 % (ref 36.0–46.0)
Hemoglobin: 12.6 g/dL (ref 12.0–15.0)
MCH: 26.2 pg (ref 26.0–34.0)
MCHC: 31.2 g/dL (ref 30.0–36.0)
MCV: 84 fL (ref 80.0–100.0)
Platelets: 191 10*3/uL (ref 150–400)
RBC: 4.81 MIL/uL (ref 3.87–5.11)
RDW: 13.2 % (ref 11.5–15.5)
WBC: 6.8 10*3/uL (ref 4.0–10.5)
nRBC: 0 % (ref 0.0–0.2)

## 2022-12-16 LAB — CULTURE, BLOOD (ROUTINE X 2): Special Requests: ADEQUATE

## 2022-12-16 LAB — GLUCOSE, CAPILLARY
Glucose-Capillary: 178 mg/dL — ABNORMAL HIGH (ref 70–99)
Glucose-Capillary: 166 mg/dL — ABNORMAL HIGH (ref 70–99)
Glucose-Capillary: 180 mg/dL — ABNORMAL HIGH (ref 70–99)
Glucose-Capillary: 160 mg/dL — ABNORMAL HIGH (ref 70–99)

## 2022-12-16 MED ORDER — ORAL CARE MOUTH RINSE: 15.0000 mL | OROMUCOSAL | Status: DC | PRN

## 2022-12-16 NOTE — Progress Notes (Signed)
PROGRESS NOTE    Karla Hoover  [id number] DOB: June 24, 1971 DOA: 12/13/2022 PCP: Pcp, No    Brief Narrative:  Karla Hoover is a 52 y.o. female with medical history significant of COPD, chronic respiratory failure on nocturnal oxygen ,CKD stage 3a, T2DM, HTN, GERD, hx of DVT/PE, hypothyroidism, HLD, grade 1 DD,  hx of CVA with left sided deficits, recurrent ESBL UTI who presented to ED with fever and shortness of breath.  She states she couldn't sleep last night and was up every hour. She had a headache last night and felt short of breath.  She got light headed and felt like she was going to fall. This morning she was gasping for air and tells me her blood pressure was low. EMS was called. No recorded fevers at SNF, but she had chills.    Assessment and Plan: Sepsis/bacteremia due to urinary tract infection ([place])- e coli 52 year old female with hx of ESBL UTI presented to ED with complaints of weakness and fever found to be septic and urine as likely source for bacteremia -final cultures ESBL- will touch base with ID pharmacist for recommendations -lactic acidosis resolved -continue PRN antipyretics  -continue meropenem  -hx of ESBL UTI in 08/30/2022 and 09/06/2022   Hyponatremia -stable  Chronic respiratory failure with hypoxia (HCC) On 2L PRN at SNF, stable   COPD with chronic bronchitis (HCC) Continue symbicort, spiriva, duoneb and SABA prn   Essential hypertension Hold toprol  -resume as able  Hemiparesis affecting left side as late effect of cerebrovascular accident (CVA) ([place]) Continue medical management  ASA, statin and PRN flexeril   DM (diabetes mellitus), type 2 ([place]) A1C of 10.5 in 09/2022 -SSI for now  hx of PE/DVT Continue eliquis  Grade I diastolic dysfunction Echo 05/2021: EF of 55-60%. Normal LVF. Grade 1 DD Strict I/O and daily weights  Continue medical therapy   CKD (chronic kidney disease) stage 3, GFR 30-59 ml/min (HCC) Stable, continue  to monitor   GERD without esophagitis Continue prevacid '30mg'$  daily   Hypothyroidism TSH normal Continue synthroid 66mg daily   Obesity Estimated body mass index is 33.17 kg/m as calculated from the following:   Height as of this encounter: '5\' 6"'$  (1.676 m).   Weight as of this encounter: 93.2 kg.    TOC consult as from SNF   DVT prophylaxis:  apixaban (ELIQUIS) tablet 5 mg    Code Status: Full Code Family Communication: at bedside 3/10  Disposition Plan:  Level of care: Med-Surg inpt    Consultants:  none   Subjective: Upset about blood draw this AM  Objective: Vitals:   12/15/22 2048 12/16/22 0445 12/16/22 0519 12/16/22 0801  BP:  (!) 156/78    Pulse:  78    Resp:  18    Temp:  98.1 F (36.7 C)    TempSrc:  Oral    SpO2: 95% 95%  95%  Weight:   93.2 kg   Height:        Intake/Output Summary (Last 24 hours) at 12/16/2022 1013 Last data filed at 12/16/2022 0830 Gross per 24 hour  Intake 2231.89 ml  Output 1200 ml  Net 1031.89 ml   Filed Weights   12/13/22 1801 12/15/22 0500 12/16/22 0519  Weight: 96.1 kg 94.3 kg 93.2 kg    Examination:   General: Appearance:    Obese female in no acute distress- in bed     Lungs:     respirations unlabored  Heart:  Normal heart rate.   MS:   All extremities are intact.    Neurologic:   Awake, alert       Data Reviewed: I have personally reviewed following labs and imaging studies  CBC: Recent Labs  Lab 12/13/22 1100 12/14/22 0532 12/15/22 0841 12/16/22 0516  WBC 16.3* 12.9* 7.4 6.8  NEUTROABS 14.0*  --   --   --   HGB 13.2 12.1 12.7 12.6  HCT 40.7 38.5 40.8 40.4  MCV 81.9 84.8 84.5 84.0  PLT 185 176 166 [id number]   Basic Metabolic Panel: Recent Labs  Lab 12/13/22 1100 12/14/22 0532 12/15/22 0841 12/16/22 0516  NA 131* 131* 134* 132*  K 4.0 4.2 4.3 4.1  CL 94* 96* 95* 96*  CO2 '26 26 26 25  '$ GLUCOSE 229* 140* 148* 167*  BUN 21* 21* 20 18  CREATININE 1.55* 1.53* 1.15* 1.19*  CALCIUM 9.0  8.6* 9.3 9.2   GFR: Estimated Creatinine Clearance: 65.1 mL/min (A) (by C-G formula based on SCr of 1.19 mg/dL (H)). Liver Function Tests: Recent Labs  Lab 12/13/22 1100  AST 29  ALT 18  ALKPHOS 83  BILITOT 1.1  PROT 7.4  ALBUMIN 3.8   No results for input(s): "LIPASE", "AMYLASE" in the last 168 hours. No results for input(s): "AMMONIA" in the last 168 hours. Coagulation Profile: Recent Labs  Lab 12/13/22 1100  INR 1.6*   Cardiac Enzymes: No results for input(s): "CKTOTAL", "CKMB", "CKMBINDEX", "TROPONINI" in the last 168 hours. BNP (last 3 results) No results for input(s): "PROBNP" in the last 8760 hours. HbA1C: No results for input(s): "HGBA1C" in the last 72 hours. CBG: Recent Labs  Lab 12/15/22 0811 12/15/22 1133 12/15/22 1639 12/15/22 2011 12/16/22 0726  GLUCAP 151* 160* 178* 185* 178*   Lipid Profile: No results for input(s): "CHOL", "HDL", "LDLCALC", "TRIG", "CHOLHDL", "LDLDIRECT" in the last 72 hours. Thyroid Function Tests: Recent Labs    12/13/22 1701  TSH 1.921   Anemia Panel: No results for input(s): "VITAMINB12", "FOLATE", "FERRITIN", "TIBC", "IRON", "RETICCTPCT" in the last 72 hours. Sepsis Labs: Recent Labs  Lab 12/13/22 1100 12/13/22 1407  LATICACIDVEN 2.3* 1.6    Recent Results (from the past 240 hour(s))  Blood Culture (routine x 2)     Status: None (Preliminary result)   Collection Time: 12/13/22 10:52 AM   Specimen: BLOOD  Result Value Ref Range Status   Specimen Description   Final    BLOOD BLOOD LEFT ARM Performed at [facility] [address] [place], [place] [address]    Special Requests   Final    BOTTLES DRAWN AEROBIC AND ANAEROBIC Blood Culture results may not be optimal due to an excessive volume of blood received in culture bottles Performed at [facility] [address] [place], [place] [address]    Culture   Final    NO GROWTH 2 DAYS Performed at [facility] [address] [place], [place] [address]    Report Status PENDING  Incomplete  Urine Culture     Status: None   Collection Time: 12/13/22 10:52 AM   Specimen: Urine, Random  Result Value Ref Range Status   Specimen Description   Final    URINE, RANDOM Performed at [facility] [address] [place],  [address]    Special Requests URINE, CLEAN CATCH  Final   Culture   Final    NO GROWTH Performed at [facility], [place]  [address], [place], [place] [address]    Report Status 12/15/2022 FINAL  Final  Blood Culture (routine x 2)     Status: Abnormal   Collection Time: 12/13/22 10:57 AM   Specimen: BLOOD  Result Value Ref Range Status   Specimen Description   Final    BLOOD RFOA Performed at [facility] [address] [place], [place] [address]    Special Requests   Final    BOTTLES DRAWN AEROBIC AND ANAEROBIC Blood Culture adequate volume Performed at [facility] [address] [place], [place] [address]    Culture  Setup Time   Final    GRAM NEGATIVE RODS ANAEROBIC BOTTLE ONLY CRITICAL RESULT CALLED TO, READ BACK BY AND VERIFIED WITH: PHARMD E. JACKSON 12/14/22 @ 0517 BY AB Performed at [facility], [place] [address] [place], [place] [address]    Culture (A)  Final    ESCHERICHIA COLI Confirmed Extended Spectrum Beta-Lactamase Producer (ESBL).  In bloodstream infections from ESBL organisms, carbapenems are preferred over piperacillin/tazobactam. They are shown to have a lower risk of mortality.    Report Status 12/16/2022 FINAL  Final   Organism ID, Bacteria ESCHERICHIA COLI  Final      Susceptibility   Escherichia coli - MIC*    AMPICILLIN >=32 RESISTANT Resistant     CEFEPIME 16 RESISTANT Resistant     CEFTAZIDIME RESISTANT Resistant     CEFTRIAXONE >=64 RESISTANT Resistant     CIPROFLOXACIN <=0.25 SENSITIVE Sensitive     GENTAMICIN >=16 RESISTANT Resistant     IMIPENEM <=0.25 SENSITIVE  Sensitive     TRIMETH/SULFA >=320 RESISTANT Resistant     AMPICILLIN/SULBACTAM >=32 RESISTANT Resistant     PIP/TAZO <=4 SENSITIVE Sensitive     * ESCHERICHIA COLI  Blood Culture ID Panel (Reflexed)     Status: Abnormal   Collection Time: 12/13/22 10:57 AM  Result Value Ref Range Status   Enterococcus faecalis NOT DETECTED NOT DETECTED Final   Enterococcus Faecium NOT DETECTED NOT DETECTED Final   Listeria monocytogenes NOT DETECTED NOT DETECTED Final   Staphylococcus species NOT DETECTED NOT DETECTED Final   Staphylococcus aureus (BCID) NOT DETECTED NOT DETECTED Final   Staphylococcus epidermidis NOT DETECTED NOT DETECTED Final   Staphylococcus lugdunensis NOT DETECTED NOT DETECTED Final   Streptococcus species NOT DETECTED NOT DETECTED Final   Streptococcus agalactiae NOT DETECTED NOT DETECTED Final   Streptococcus pneumoniae NOT DETECTED NOT DETECTED Final   Streptococcus pyogenes NOT DETECTED NOT DETECTED Final   A.calcoaceticus-baumannii NOT DETECTED NOT DETECTED Final   Bacteroides fragilis NOT DETECTED NOT DETECTED Final   Enterobacterales DETECTED (A) NOT DETECTED Final    Comment: Enterobacterales represent a large order of gram negative bacteria, not a single organism. CRITICAL RESULT CALLED TO, READ BACK BY AND VERIFIED WITH: PHARMD E. JACKSON 12/14/22 @ 0517 BY AB    Enterobacter cloacae complex NOT DETECTED NOT DETECTED Final   Escherichia coli DETECTED (A) NOT DETECTED Final    Comment: CRITICAL RESULT CALLED TO, READ BACK BY AND VERIFIED WITH: PHARMD E. JACKSON 12/14/22 @ 0517 BY AB    Klebsiella aerogenes NOT DETECTED NOT DETECTED Final   Klebsiella oxytoca NOT DETECTED NOT DETECTED Final   Klebsiella pneumoniae NOT DETECTED NOT DETECTED Final   Proteus species NOT DETECTED NOT DETECTED Final   Salmonella species NOT DETECTED NOT DETECTED Final   Serratia marcescens NOT DETECTED NOT DETECTED Final   Haemophilus influenzae NOT DETECTED NOT DETECTED Final  Neisseria  meningitidis NOT DETECTED NOT DETECTED Final   Pseudomonas aeruginosa NOT DETECTED NOT DETECTED Final   Stenotrophomonas maltophilia NOT DETECTED NOT DETECTED Final   Candida albicans NOT DETECTED NOT DETECTED Final   Candida auris NOT DETECTED NOT DETECTED Final   Candida glabrata NOT DETECTED NOT DETECTED Final   Candida krusei NOT DETECTED NOT DETECTED Final   Candida parapsilosis NOT DETECTED NOT DETECTED Final   Candida tropicalis NOT DETECTED NOT DETECTED Final   Cryptococcus neoformans/gattii NOT DETECTED NOT DETECTED Final   CTX-M ESBL DETECTED (A) NOT DETECTED Final    Comment: CRITICAL RESULT CALLED TO, READ BACK BY AND VERIFIED WITH: PHARMD E. JACKSON 12/14/22 @ 0517 BY AB (NOTE) Extended spectrum beta-lactamase detected. Recommend a carbapenem as initial therapy.      Carbapenem resistance IMP NOT DETECTED NOT DETECTED Final   Carbapenem resistance KPC NOT DETECTED NOT DETECTED Final   Carbapenem resistance NDM NOT DETECTED NOT DETECTED Final   Carbapenem resist OXA 48 LIKE NOT DETECTED NOT DETECTED Final   Carbapenem resistance VIM NOT DETECTED NOT DETECTED Final    Comment: Performed at [facility], [place] [address], [place], [place] [address]  Resp panel by RT-PCR (RSV, Flu A&B, Covid) Anterior Nasal Swab     Status: None   Collection Time: 12/13/22 11:00 AM   Specimen: Anterior Nasal Swab  Result Value Ref Range Status   SARS Coronavirus 2 by RT PCR NEGATIVE NEGATIVE Final    Comment: (NOTE) SARS-CoV-2 target nucleic acids are NOT DETECTED.  The SARS-CoV-2 RNA is generally detectable in upper respiratory specimens during the acute phase of infection. The lowest concentration of SARS-CoV-2 viral copies this assay can detect is 138 copies/mL. A negative result does not preclude SARS-Cov-2 infection and should not be used as the sole basis for treatment or other patient management decisions. A negative result may occur with  improper specimen  collection/handling, submission of specimen other than nasopharyngeal swab, presence of viral mutation(s) within the areas targeted by this assay, and inadequate number of viral copies(<138 copies/mL). A negative result must be combined with clinical observations, patient history, and epidemiological information. The expected result is Negative.  Fact Sheet for Patients:  EntrepreneurPulse.com.au  Fact Sheet for Healthcare Providers:  IncredibleEmployment.be  This test is no t yet approved or cleared by the [place] FDA and  has been authorized for detection and/or diagnosis of SARS-CoV-2 by FDA under an Emergency Use Authorization (EUA). This EUA will remain  in effect (meaning this test can be used) for the duration of the COVID-19 declaration under Section 564(b)(1) of the Act, 21 U.S.C.section 360bbb-3(b)(1), unless the authorization is terminated  or revoked sooner.       Influenza A by PCR NEGATIVE NEGATIVE Final   Influenza B by PCR NEGATIVE NEGATIVE Final    Comment: (NOTE) The Xpert Xpress SARS-CoV-2/FLU/RSV plus assay is intended as an aid in the diagnosis of influenza from Nasopharyngeal swab specimens and should not be used as a sole basis for treatment. Nasal washings and aspirates are unacceptable for Xpert Xpress SARS-CoV-2/FLU/RSV testing.  Fact Sheet for Patients: EntrepreneurPulse.com.au  Fact Sheet for Healthcare Providers: IncredibleEmployment.be  This test is not yet approved or cleared by the [place] FDA and has been authorized for detection and/or diagnosis of SARS-CoV-2 by FDA under an Emergency Use Authorization (EUA). This EUA will remain in effect (meaning this test can be used) for the duration of the COVID-19 declaration under Section 564(b)(1) of the Act, 21  U.S.C. section 360bbb-3(b)(1), unless the authorization is terminated or revoked.     Resp Syncytial  Virus by PCR NEGATIVE NEGATIVE Final    Comment: (NOTE) Fact Sheet for Patients: EntrepreneurPulse.com.au  Fact Sheet for Healthcare Providers: IncredibleEmployment.be  This test is not yet approved or cleared by the [place] FDA and has been authorized for detection and/or diagnosis of SARS-CoV-2 by FDA under an Emergency Use Authorization (EUA). This EUA will remain in effect (meaning this test can be used) for the duration of the COVID-19 declaration under Section 564(b)(1) of the Act, 21 U.S.C. section 360bbb-3(b)(1), unless the authorization is terminated or revoked.  Performed at [facility], [place] [address], [place],  [address]          Radiology Studies: No results found.      Scheduled Meds:  acidophilus  1 capsule Oral Daily   apixaban  5 mg Oral BID   aspirin  81 mg Oral q AM   diclofenac Sodium  2 g Topical QID   insulin aspart  0-9 Units Subcutaneous TID WC   insulin glargine-yfgn  10 Units Subcutaneous BID   levothyroxine  75 mcg Oral Q0600   magnesium oxide  400 mg Oral Daily   melatonin  5 mg Oral QHS   mometasone-formoterol  2 puff Inhalation BID   umeclidinium bromide  1 puff Inhalation Daily   Continuous Infusions:  meropenem (MERREM) IV 1 g (12/16/22 0514)     LOS: 2 days    Time spent: 45 minutes spent on chart review, discussion with nursing staff, consultants, updating family and interview/physical exam; more than 50% of that time was spent in counseling and/or coordination of care.    Geradine Girt, DO [facility] Available via Epic secure chat 7am-7pm After these hours, please refer to coverage provider listed on [email or web address] 12/16/2022, 10:13 AM

## 2022-12-17 LAB — GLUCOSE, CAPILLARY
Glucose-Capillary: 146 mg/dL — ABNORMAL HIGH (ref 70–99)
Glucose-Capillary: 142 mg/dL — ABNORMAL HIGH (ref 70–99)
Glucose-Capillary: 123 mg/dL — ABNORMAL HIGH (ref 70–99)
Glucose-Capillary: 168 mg/dL — ABNORMAL HIGH (ref 70–99)

## 2022-12-17 MED ORDER — CIPROFLOXACIN HCL 500 MG PO TABS
750.0000 mg | ORAL_TABLET | Freq: Two times a day (BID) | ORAL | Status: DC
  Administered 2022-12-17 – 2022-12-18 (×3): 750 mg via ORAL
  Filled 2022-12-17 (×3): qty 2

## 2022-12-17 MED ORDER — METOPROLOL SUCCINATE ER 50 MG PO TB24
50.0000 mg | ORAL_TABLET | Freq: Every morning | ORAL | Status: DC
  Administered 2022-12-17 – 2022-12-18 (×2): 50 mg via ORAL
  Filled 2022-12-17 (×2): qty 1

## 2022-12-17 NOTE — Progress Notes (Signed)
PROGRESS NOTE    KEONDA FAUSETT  [id number] DOB: May 30, 1971 DOA: 12/13/2022 PCP: Pcp, No    Brief Narrative:  TUWANA LOFTHOUSE is a 52 y.o. female with medical history significant of COPD, chronic respiratory failure on nocturnal oxygen ,CKD stage 3a, T2DM, HTN, GERD, hx of DVT/PE, hypothyroidism, HLD, grade 1 DD,  hx of CVA with left sided deficits, recurrent ESBL UTI who presented to ED with fever and shortness of breath.  She states she couldn't sleep last night and was up every hour. She had a headache last night and felt short of breath.  She got light headed and felt like she was going to fall. This morning she was gasping for air and tells me her blood pressure was low. EMS was called. No recorded fevers at SNF, but she had chills.  Much improved since she has been in the hospital.  [facility] consulted for return to facility in next 24-48 hours.   Assessment and Plan: Sepsis/bacteremia due to urinary tract infection ([place])- e coli 52 year old female with hx of ESBL UTI presented to ED with complaints of weakness and fever found to be septic and urine as likely source for bacteremia -final cultures ESBL- discussed with ID pharmacy and they recommend cipro '750mg'$  BID  (has tolerated in the past even with Levaquin allergy) -lactic acidosis resolved -continue PRN antipyretics  -d/c IV abx and start cipro -hx of ESBL UTI in 08/30/2022 and 09/06/2022   Hyponatremia -stable  Chronic respiratory failure with hypoxia (HCC) On 2L PRN at SNF, stable   COPD with chronic bronchitis (HCC) Continue symbicort, spiriva, duoneb and SABA prn   Essential hypertension -resume toprol  Hemiparesis affecting left side as late effect of cerebrovascular accident (CVA) ([place]) Continue medical management  ASA, statin and PRN flexeril   DM (diabetes mellitus), type 2 (HCC) A1C of 10.5 in 09/2022 -SSI for now  hx of PE/DVT Continue eliquis  Grade I diastolic dysfunction Echo 05/2021: EF of 55-60%.  Normal LVF. Grade 1 DD Strict I/O and daily weights  Continue medical therapy   CKD (chronic kidney disease) stage 3, GFR 30-59 ml/min (HCC) Stable, continue to monitor   GERD without esophagitis Continue prevacid '30mg'$  daily   Hypothyroidism TSH normal Continue synthroid 26mg daily   Obesity Estimated body mass index is 33.2 kg/m as calculated from the following:   Height as of this encounter: '5\' 6"'$  (1.676 m).   Weight as of this encounter: 93.3 kg.    TOC consulted as from LT- SNF   DVT prophylaxis:  apixaban (ELIQUIS) tablet 5 mg    Code Status: Full Code Family Communication: at bedside 3/10  Disposition Plan:  Level of care: Med-Surg inpt    Consultants:  none   Subjective: Overall feeling better  Objective: Vitals:   12/16/22 2309 12/16/22 2311 12/17/22 0533 12/17/22 0759  BP:  (!) 142/72    Pulse:  75    Resp:  16    Temp:  98.3 F (36.8 C)    TempSrc:  Oral    SpO2: (!) 86% 91%  93%  Weight:   93.3 kg   Height:        Intake/Output Summary (Last 24 hours) at 12/17/2022 1127 Last data filed at 12/17/2022 0340 Gross per 24 hour  Intake 660 ml  Output 475 ml  Net 185 ml   Filed Weights   12/15/22 0500 12/16/22 0519 12/17/22 0533  Weight: 94.3 kg 93.2 kg 93.3 kg  Examination:   General: Appearance:    Obese female in no acute distress- in bed     Lungs:     respirations unlabored  Heart:    Normal heart rate.   MS:   All extremities are intact.   Neurologic:   Awake, alert         Data Reviewed: I have personally reviewed following labs and imaging studies  CBC: Recent Labs  Lab 12/13/22 1100 12/14/22 0532 12/15/22 0841 12/16/22 0516  WBC 16.3* 12.9* 7.4 6.8  NEUTROABS 14.0*  --   --   --   HGB 13.2 12.1 12.7 12.6  HCT 40.7 38.5 40.8 40.4  MCV 81.9 84.8 84.5 84.0  PLT 185 176 166 [id number]   Basic Metabolic Panel: Recent Labs  Lab 12/13/22 1100 12/14/22 0532 12/15/22 0841 12/16/22 0516  NA 131* 131* 134* 132*  K  4.0 4.2 4.3 4.1  CL 94* 96* 95* 96*  CO2 '26 26 26 25  '$ GLUCOSE 229* 140* 148* 167*  BUN 21* 21* 20 18  CREATININE 1.55* 1.53* 1.15* 1.19*  CALCIUM 9.0 8.6* 9.3 9.2   GFR: Estimated Creatinine Clearance: 65.1 mL/min (A) (by C-G formula based on SCr of 1.19 mg/dL (H)). Liver Function Tests: Recent Labs  Lab 12/13/22 1100  AST 29  ALT 18  ALKPHOS 83  BILITOT 1.1  PROT 7.4  ALBUMIN 3.8   No results for input(s): "LIPASE", "AMYLASE" in the last 168 hours. No results for input(s): "AMMONIA" in the last 168 hours. Coagulation Profile: Recent Labs  Lab 12/13/22 1100  INR 1.6*   Cardiac Enzymes: No results for input(s): "CKTOTAL", "CKMB", "CKMBINDEX", "TROPONINI" in the last 168 hours. BNP (last 3 results) No results for input(s): "PROBNP" in the last 8760 hours. HbA1C: No results for input(s): "HGBA1C" in the last 72 hours. CBG: Recent Labs  Lab 12/16/22 0726 12/16/22 1128 12/16/22 1639 12/16/22 2114 12/17/22 0734  GLUCAP 178* 166* 180* 160* 146*   Lipid Profile: No results for input(s): "CHOL", "HDL", "LDLCALC", "TRIG", "CHOLHDL", "LDLDIRECT" in the last 72 hours. Thyroid Function Tests: No results for input(s): "TSH", "T4TOTAL", "FREET4", "T3FREE", "THYROIDAB" in the last 72 hours.  Anemia Panel: No results for input(s): "VITAMINB12", "FOLATE", "FERRITIN", "TIBC", "IRON", "RETICCTPCT" in the last 72 hours. Sepsis Labs: Recent Labs  Lab 12/13/22 1100 12/13/22 1407  LATICACIDVEN 2.3* 1.6    Recent Results (from the past 240 hour(s))  Blood Culture (routine x 2)     Status: None (Preliminary result)   Collection Time: 12/13/22 10:52 AM   Specimen: BLOOD  Result Value Ref Range Status   Specimen Description   Final    BLOOD BLOOD LEFT ARM Performed at [facility] [address], [place], [place] [address]    Special Requests   Final    BOTTLES DRAWN AEROBIC AND ANAEROBIC Blood Culture results may not be optimal due to an excessive volume  of blood received in culture bottles Performed at [facility] [address] [place], [place] [address]    Culture   Final    NO GROWTH 4 DAYS Performed at [facility], [place] [address], [place], [place] [address]    Report Status PENDING  Incomplete  Urine Culture     Status: None   Collection Time: 12/13/22 10:52 AM   Specimen: Urine, Random  Result Value Ref Range Status   Specimen Description   Final    URINE, RANDOM Performed at [facility],  [place] [address], [place], [place] [address]    Special Requests URINE, CLEAN CATCH  Final   Culture   Final    NO GROWTH Performed at [facility], [place] [address], [place], [place] [address]    Report Status 12/15/2022 FINAL  Final  Blood Culture (routine x 2)     Status: Abnormal   Collection Time: 12/13/22 10:57 AM   Specimen: BLOOD  Result Value Ref Range Status   Specimen Description   Final    BLOOD RFOA Performed at [facility] [address] [place], [place] [address]    Special Requests   Final    BOTTLES DRAWN AEROBIC AND ANAEROBIC Blood Culture adequate volume Performed at [facility] [address], [place], [place] [address]    Culture  Setup Time   Final    GRAM NEGATIVE RODS ANAEROBIC BOTTLE ONLY CRITICAL RESULT CALLED TO, READ BACK BY AND VERIFIED WITH: PHARMD E. JACKSON 12/14/22 @ 0517 BY AB Performed at [facility], [place] [address] [place], [place] [address]    Culture (A)  Final    ESCHERICHIA COLI Confirmed Extended Spectrum Beta-Lactamase Producer (ESBL).  In bloodstream infections from ESBL organisms, carbapenems are preferred over piperacillin/tazobactam. They are shown to have a lower risk of mortality.    Report Status 12/16/2022 FINAL  Final   Organism ID, Bacteria ESCHERICHIA COLI  Final      Susceptibility   Escherichia coli - MIC*    AMPICILLIN >=32 RESISTANT Resistant     CEFEPIME 16 RESISTANT  Resistant     CEFTAZIDIME RESISTANT Resistant     CEFTRIAXONE >=64 RESISTANT Resistant     CIPROFLOXACIN <=0.25 SENSITIVE Sensitive     GENTAMICIN >=16 RESISTANT Resistant     IMIPENEM <=0.25 SENSITIVE Sensitive     TRIMETH/SULFA >=320 RESISTANT Resistant     AMPICILLIN/SULBACTAM >=32 RESISTANT Resistant     PIP/TAZO <=4 SENSITIVE Sensitive     * ESCHERICHIA COLI  Blood Culture ID Panel (Reflexed)     Status: Abnormal   Collection Time: 12/13/22 10:57 AM  Result Value Ref Range Status   Enterococcus faecalis NOT DETECTED NOT DETECTED Final   Enterococcus Faecium NOT DETECTED NOT DETECTED Final   Listeria monocytogenes NOT DETECTED NOT DETECTED Final   Staphylococcus species NOT DETECTED NOT DETECTED Final   Staphylococcus aureus (BCID) NOT DETECTED NOT DETECTED Final   Staphylococcus epidermidis NOT DETECTED NOT DETECTED Final   Staphylococcus lugdunensis NOT DETECTED NOT DETECTED Final   Streptococcus species NOT DETECTED NOT DETECTED Final   Streptococcus agalactiae NOT DETECTED NOT DETECTED Final   Streptococcus pneumoniae NOT DETECTED NOT DETECTED Final   Streptococcus pyogenes NOT DETECTED NOT DETECTED Final   A.calcoaceticus-baumannii NOT DETECTED NOT DETECTED Final   Bacteroides fragilis NOT DETECTED NOT DETECTED Final   Enterobacterales DETECTED (A) NOT DETECTED Final    Comment: Enterobacterales represent a large order of gram negative bacteria, not a single organism. CRITICAL RESULT CALLED TO, READ BACK BY AND VERIFIED WITH: PHARMD E. JACKSON 12/14/22 @ 0517 BY AB    Enterobacter cloacae complex NOT DETECTED NOT DETECTED Final   Escherichia coli DETECTED (A) NOT DETECTED Final    Comment: CRITICAL RESULT CALLED TO, READ BACK BY AND VERIFIED WITH: PHARMD E. JACKSON 12/14/22 @ 0517 BY AB    Klebsiella aerogenes NOT DETECTED NOT DETECTED Final   Klebsiella oxytoca NOT DETECTED NOT DETECTED Final   Klebsiella pneumoniae NOT DETECTED NOT DETECTED Final  Proteus species NOT  DETECTED NOT DETECTED Final   Salmonella species NOT DETECTED NOT DETECTED Final   Serratia marcescens NOT DETECTED NOT DETECTED Final   Haemophilus influenzae NOT DETECTED NOT DETECTED Final   Neisseria meningitidis NOT DETECTED NOT DETECTED Final   Pseudomonas aeruginosa NOT DETECTED NOT DETECTED Final   Stenotrophomonas maltophilia NOT DETECTED NOT DETECTED Final   Candida albicans NOT DETECTED NOT DETECTED Final   Candida auris NOT DETECTED NOT DETECTED Final   Candida glabrata NOT DETECTED NOT DETECTED Final   Candida krusei NOT DETECTED NOT DETECTED Final   Candida parapsilosis NOT DETECTED NOT DETECTED Final   Candida tropicalis NOT DETECTED NOT DETECTED Final   Cryptococcus neoformans/gattii NOT DETECTED NOT DETECTED Final   CTX-M ESBL DETECTED (A) NOT DETECTED Final    Comment: CRITICAL RESULT CALLED TO, READ BACK BY AND VERIFIED WITH: PHARMD E. JACKSON 12/14/22 @ 0517 BY AB (NOTE) Extended spectrum beta-lactamase detected. Recommend a carbapenem as initial therapy.      Carbapenem resistance IMP NOT DETECTED NOT DETECTED Final   Carbapenem resistance KPC NOT DETECTED NOT DETECTED Final   Carbapenem resistance NDM NOT DETECTED NOT DETECTED Final   Carbapenem resist OXA 48 LIKE NOT DETECTED NOT DETECTED Final   Carbapenem resistance VIM NOT DETECTED NOT DETECTED Final    Comment: Performed at [facility], [place] [address], [place], [place] [address]  Resp panel by RT-PCR (RSV, Flu A&B, Covid) Anterior Nasal Swab     Status: None   Collection Time: 12/13/22 11:00 AM   Specimen: Anterior Nasal Swab  Result Value Ref Range Status   SARS Coronavirus 2 by RT PCR NEGATIVE NEGATIVE Final    Comment: (NOTE) SARS-CoV-2 target nucleic acids are NOT DETECTED.  The SARS-CoV-2 RNA is generally detectable in upper respiratory specimens during the acute phase of infection. The lowest concentration of SARS-CoV-2 viral copies this assay can detect is 138 copies/mL. A negative result  does not preclude SARS-Cov-2 infection and should not be used as the sole basis for treatment or other patient management decisions. A negative result may occur with  improper specimen collection/handling, submission of specimen other than nasopharyngeal swab, presence of viral mutation(s) within the areas targeted by this assay, and inadequate number of viral copies(<138 copies/mL). A negative result must be combined with clinical observations, patient history, and epidemiological information. The expected result is Negative.  Fact Sheet for Patients:  EntrepreneurPulse.com.au  Fact Sheet for Healthcare Providers:  IncredibleEmployment.be  This test is no t yet approved or cleared by the [place] FDA and  has been authorized for detection and/or diagnosis of SARS-CoV-2 by FDA under an Emergency Use Authorization (EUA). This EUA will remain  in effect (meaning this test can be used) for the duration of the COVID-19 declaration under Section 564(b)(1) of the Act, 21 U.S.C.section 360bbb-3(b)(1), unless the authorization is terminated  or revoked sooner.       Influenza A by PCR NEGATIVE NEGATIVE Final   Influenza B by PCR NEGATIVE NEGATIVE Final    Comment: (NOTE) The Xpert Xpress SARS-CoV-2/FLU/RSV plus assay is intended as an aid in the diagnosis of influenza from Nasopharyngeal swab specimens and should not be used as a sole basis for treatment. Nasal washings and aspirates are unacceptable for Xpert Xpress SARS-CoV-2/FLU/RSV testing.  Fact Sheet for Patients: EntrepreneurPulse.com.au  Fact Sheet for Healthcare Providers: IncredibleEmployment.be  This test is not yet approved or cleared by the [place] FDA and has been authorized for detection and/or diagnosis  of SARS-CoV-2 by FDA under an Emergency Use Authorization (EUA). This EUA will remain in effect (meaning this test can be used) for  the duration of the COVID-19 declaration under Section 564(b)(1) of the Act, 21 U.S.C. section 360bbb-3(b)(1), unless the authorization is terminated or revoked.     Resp Syncytial Virus by PCR NEGATIVE NEGATIVE Final    Comment: (NOTE) Fact Sheet for Patients: EntrepreneurPulse.com.au  Fact Sheet for Healthcare Providers: IncredibleEmployment.be  This test is not yet approved or cleared by the [place] FDA and has been authorized for detection and/or diagnosis of SARS-CoV-2 by FDA under an Emergency Use Authorization (EUA). This EUA will remain in effect (meaning this test can be used) for the duration of the COVID-19 declaration under Section 564(b)(1) of the Act, 21 U.S.C. section 360bbb-3(b)(1), unless the authorization is terminated or revoked.  Performed at [facility], [place] [address], [place], [place] [address]          Radiology Studies: No results found.      Scheduled Meds:  acidophilus  1 capsule Oral Daily   apixaban  5 mg Oral BID   aspirin  81 mg Oral q AM   ciprofloxacin  750 mg Oral BID   diclofenac Sodium  2 g Topical QID   insulin aspart  0-9 Units Subcutaneous TID WC   insulin glargine-yfgn  10 Units Subcutaneous BID   levothyroxine  75 mcg Oral Q0600   magnesium oxide  400 mg Oral Daily   melatonin  5 mg Oral QHS   mometasone-formoterol  2 puff Inhalation BID   umeclidinium bromide  1 puff Inhalation Daily   Continuous Infusions:     LOS: 3 days    Time spent: 45 minutes spent on chart review, discussion with nursing staff, consultants, updating family and interview/physical exam; more than 50% of that time was spent in counseling and/or coordination of care.    Geradine Girt, DO [facility] Available via Epic secure chat 7am-7pm After these hours, please refer to coverage provider listed on [email or web address] 12/17/2022, 11:27 AM

## 2022-12-18 ENCOUNTER — Other Ambulatory Visit (HOSPITAL_COMMUNITY): Payer: Self-pay

## 2022-12-18 DIAGNOSIS — E785 Hyperlipidemia, unspecified: Secondary | ICD-10-CM | POA: Diagnosis present

## 2022-12-18 DIAGNOSIS — E66811 Obesity, class 1: Secondary | ICD-10-CM

## 2022-12-18 DIAGNOSIS — Z6832 Body mass index (BMI) 32.0-32.9, adult: Secondary | ICD-10-CM

## 2022-12-18 LAB — CULTURE, BLOOD (ROUTINE X 2): Culture: NO GROWTH

## 2022-12-18 LAB — GLUCOSE, CAPILLARY: Glucose-Capillary: 167 mg/dL — ABNORMAL HIGH (ref 70–99)

## 2022-12-18 MED ORDER — CIPROFLOXACIN HCL 250 MG PO TABS
750.0000 mg | ORAL_TABLET | Freq: Two times a day (BID) | ORAL | 0 refills | Status: DC
  Filled 2022-12-18: qty 42, 7d supply, fill #0

## 2022-12-18 MED ORDER — INSULIN GLARGINE 100 UNIT/ML ~~LOC~~ SOLN
10.0000 [IU] | Freq: Two times a day (BID) | SUBCUTANEOUS | 11 refills | Status: DC
  Filled 2022-12-18: qty 10, 28d supply, fill #0

## 2022-12-18 MED ORDER — RISAQUAD PO CAPS
1.0000 | ORAL_CAPSULE | Freq: Every day | ORAL | 11 refills | Status: DC
  Filled 2022-12-18: qty 60, 60d supply, fill #0

## 2022-12-18 MED ORDER — CIPROFLOXACIN HCL 250 MG PO TABS: 750.0000 mg | ORAL_TABLET | Freq: Two times a day (BID) | ORAL | 0 refills | Status: AC

## 2022-12-18 NOTE — Evaluation (Signed)
Occupational Therapy Evaluation Patient Details Name: BENISHA CASTETTER MRN: [id number] DOB: 24-Sep-1971 Today's Date: 12/18/2022   History of Present Illness Patient is a 52 year old female who presented from [facility] and was admitted with Sepsis and UTI. PMH: CVA L side weakness, DM,   Clinical Impression    Patient is a 52 year old female who was admitted for above. Patient is LTC from SNF. Patient reported being MI for w/c level ADLs and transfers. Patient currently has dizziness impacting safety and ability to engage in functional mobility in room as completed during previous admission. Patient plans to d/c back to SNF for long term care. Patient would benefit from skilled OT services to mitigate barriers for patient to be able to transition back to SNF with reduced caregiver burden.    Blood pressure when reporting dizziness:  142/84 mmhg (100 MAP) HR 73 bpm O2 90% on RA         Recommendations for follow up therapy are one component of a multi-disciplinary discharge planning process, led by the attending physician.  Recommendations may be updated based on patient status, additional functional criteria and insurance authorization.   Follow Up Recommendations  Skilled nursing-short term rehab (<3 hours/day)     Assistance Recommended at Discharge Frequent or constant Supervision/Assistance  Patient can return home with the following A little help with walking and/or transfers;Direct supervision/assist for medications management;Assist for transportation;Help with stairs or ramp for entrance;Direct supervision/assist for financial management;A little help with bathing/dressing/bathroom    Functional Status Assessment  Patient has had a recent decline in their functional status and demonstrates the ability to make significant improvements in function in a reasonable and predictable amount of time.  Equipment Recommendations  None recommended by OT       Precautions / Restrictions  Precautions Precaution Comments: L hemi Restrictions Weight Bearing Restrictions: No      Mobility Bed Mobility Overal bed mobility: Needs Assistance Bed Mobility: Supine to Sit     Supine to sit: Supervision          Transfers Overall transfer level: Needs assistance Equipment used: Rolling walker (2 wheels) Transfers: Sit to/from Stand, Bed to chair/wheelchair/BSC Sit to Stand: Supervision Stand pivot transfers: Supervision         General transfer comment: with increased time.      Balance Overall balance assessment: Mild deficits observed, not formally tested         ADL either performed or assessed with clinical judgement   ADL Overall ADL's : Needs assistance/impaired Eating/Feeding: Modified independent;Sitting   Grooming: Sitting;Set up               Lower Body Dressing: Min guard;Sitting/lateral leans Lower Body Dressing Details (indicate cue type and reason): EOB, unstady with balance in standing without holding onto objects. Toilet Transfer: Agricultural engineer (2 wheels) Toilet Transfer Details (indicate cue type and reason): with increased time. Toileting- Clothing Manipulation and Hygiene: Minimal assistance;Sit to/from stand         General ADL Comments: patient was able to don bilateral shoes sitting EOB. patient was able to participate in standing and transfers in room with one UE support on RW. patient reported feeling dizziness with standing. vitals 142/84 mmhg (100 MAP) HR 73 bpm, O2 was 90% on RA   . patient reported feeling warm in room, patient was noted to have temperature in room set to 73 with sun shining into room. patient declined for therapist to adjust temp at this time.  nurse made aware. patient reportd plan was to d/c back to SNF at time of d/c.     Vision   Vision Assessment?: No apparent visual deficits            Pertinent Vitals/Pain Pain Assessment Pain Assessment: No/denies pain     Hand Dominance  Right   Extremity/Trunk Assessment Upper Extremity Assessment Upper Extremity Assessment: LUE deficits/detail LUE Deficits / Details: L side hemi with no active movement. patient reported having machine at SNF and brace to help prevent contractures.   Lower Extremity Assessment Lower Extremity Assessment: Defer to PT evaluation       Communication Communication Communication: No difficulties   Cognition Arousal/Alertness: Awake/alert Behavior During Therapy: WFL for tasks assessed/performed Overall Cognitive Status: Within Functional Limits for tasks assessed                                 General Comments: patient was plesant and cooperative                Home Living Family/patient expects to be discharged to:: Skilled nursing facility         Additional Comments: [facility]      Prior Functioning/Environment               Mobility Comments: pt reports mod I with hemi walker ADLs Comments: pt reports sup-MOD I for dressing; MIN A for bathing with shower chair, I for feeding        OT Problem List: Decreased activity tolerance;Decreased coordination;Decreased safety awareness;Impaired UE functional use      OT Treatment/Interventions: Self-care/ADL training;Patient/family education;Therapeutic activities    OT Goals(Current goals can be found in the care plan section) Acute Rehab OT Goals Patient Stated Goal: to get LUE better OT Goal Formulation: With patient Time For Goal Achievement: 01/01/23 Potential to Achieve Goals: Fair  OT Frequency:         AM-PAC OT "6 Clicks" Daily Activity     Outcome Measure Help from another person eating meals?: None Help from another person taking care of personal grooming?: A Little Help from another person toileting, which includes using toliet, bedpan, or urinal?: A Little Help from another person bathing (including washing, rinsing, drying)?: A Little Help from another person to put on and  taking off regular upper body clothing?: A Little Help from another person to put on and taking off regular lower body clothing?: A Little 6 Click Score: 19   End of Session Equipment Utilized During Treatment: Rolling walker (2 wheels) Nurse Communication: Other (comment) (ok to participate, updated on patients status and vitals after session.)  Activity Tolerance: Patient tolerated treatment well Patient left: in chair;with call bell/phone within reach;with chair alarm set  OT Visit Diagnosis: Unsteadiness on feet (R26.81);Muscle weakness (generalized) (M62.81);Other abnormalities of gait and mobility (R26.89)                Time: [id number] OT Time Calculation (min): 24 min Charges:  OT General Charges $OT Visit: 1 Visit OT Evaluation $OT Eval Low Complexity: 1 Low OT Treatments $Self Care/Home Management : 8-22 mins  Rennie Plowman, MS Acute Rehabilitation Department Office# [phone]   Willa Rough 12/18/2022, 10:53 AM

## 2022-12-18 NOTE — TOC Transition Note (Addendum)
Transition of Care [facility]) - CM/SW Discharge Note   Patient Details  Name: Karla Hoover MRN: [id number] Date of Birth: 07/16/1971  [facility] CM/SW Contact:  Dessa Phi, RN Phone Number: 12/18/2022, 10:45 AM   Clinical Narrative:  confirmed patient from [facility] rep [name]-facility does not need fl2-they will accept back & provide 02 if needed. D/c summary sent.going to rm#[phone],report tel#[phone]. PTAR called. No further CM needs. -11a-awaiting clarification of meds on d/c summary if facility will have available prior PTAR.MD updated. -12:29p-facility can provide all meds, & services on d/c summary.PTAR called. No further CM needs.    Final next level of care: Long Term Nursing Home Barriers to Discharge: No Barriers Identified   Patient Goals and CMS Choice CMS Medicare.gov Compare Post Acute Care list provided to:: Patient Choice offered to / list presented to : Patient  Discharge Placement                         Discharge Plan and Services Additional resources added to the After Visit Summary for     Discharge Planning Services: CM Consult Post Acute Care Choice: Nursing Home                               Social Determinants of Health (SDOH) Interventions SDOH Screenings   Food Insecurity: No Food Insecurity (12/13/2022)  Housing: Low Risk  (12/13/2022)  Transportation Needs: No Transportation Needs (12/13/2022)  Utilities: Not At Risk (12/13/2022)  Depression (PHQ2-9): Medium Risk (10/04/2019)  Tobacco Use: Medium Risk (12/14/2022)     Readmission Risk Interventions    01/04/2022    2:25 PM 12/03/2021    9:55 AM  Readmission Risk Prevention Plan  Transportation Screening Complete Complete  PCP or Specialist Appt within 3-5 Days Complete Complete  HRI or [place] Complete Complete  Social Work Consult for [place] Planning/Counseling  Complete  Palliative Care Screening Not Applicable Complete  Medication  Review Press photographer) Complete Complete

## 2022-12-18 NOTE — Evaluation (Deleted)
Occupational Therapy Evaluation Patient Details Name: Karla Hoover MRN: [id number] DOB: December 14, 1970 Today's Date: 12/18/2022   History of Present Illness Patient is a 52 year old female who presented from [facility] and was admitted with Sepsis and UTI. PMH: CVA L side weakness, DM,   Clinical Impression   Patient is a 52 year old female who was admitted for above. Patient is LTC from SNF. Patient reported being MI for w/c level ADLs and transfers. Patient currently has dizziness impacting safety and ability to engage in functional mobility in room as completed during previous admission. Patient plans to d/c back to SNF for long term care. Patient would benefit from skilled OT services to mitigate barriers for patient to be able to transition back to SNF with reduced caregiver burden.    Blood pressure when reporting dizziness:  142/84 mmhg (100 MAP) HR 73 bpm O2 90% on RA        Recommendations for follow up therapy are one component of a multi-disciplinary discharge planning process, led by the attending physician.  Recommendations may be updated based on patient status, additional functional criteria and insurance authorization.   Follow Up Recommendations  Skilled nursing-short term rehab (<3 hours/day)     Assistance Recommended at Discharge Frequent or constant Supervision/Assistance  Patient can return home with the following A little help with walking and/or transfers;Direct supervision/assist for medications management;Assist for transportation;Help with stairs or ramp for entrance;Direct supervision/assist for financial management;A little help with bathing/dressing/bathroom    Functional Status Assessment  Patient has had a recent decline in their functional status and demonstrates the ability to make significant improvements in function in a reasonable and predictable amount of time.  Equipment Recommendations  None recommended by OT       Precautions / Restrictions  Precautions Precaution Comments: L hemi Restrictions Weight Bearing Restrictions: No      Mobility Bed Mobility Overal bed mobility: Needs Assistance Bed Mobility: Supine to Sit     Supine to sit: Supervision          Transfers Overall transfer level: Needs assistance Equipment used: Rolling walker (2 wheels) Transfers: Sit to/from Stand, Bed to chair/wheelchair/BSC Sit to Stand: Supervision Stand pivot transfers: Supervision         General transfer comment: with increased time.      Balance Overall balance assessment: Mild deficits observed, not formally tested           ADL either performed or assessed with clinical judgement   ADL Overall ADL's : At baseline       General ADL Comments: patient was able to don bilateral shoes sitting EOB. patient was able to participate in standing and transfers in room with one UE support on RW. patient reported feeling dizziness with standing. vitals 142/84 mmhg (100 MAP) HR 73 bpm, O2 was 90% on RA   . patient reported feeling warm in room, patient was noted to have temperature in room set to 73 with sun shining into room. patient declined for therapist to adjust temp at this time. nurse made aware. patient reportd plan was to d/c back to SNF at time of d/c.     Vision   Vision Assessment?: No apparent visual deficits            Pertinent Vitals/Pain Pain Assessment Pain Assessment: No/denies pain     Hand Dominance Right   Extremity/Trunk Assessment Upper Extremity Assessment Upper Extremity Assessment: LUE deficits/detail LUE Deficits / Details: L side hemi with no  active movement. patient reported having machine at SNF and brace to help prevent contractures.   Lower Extremity Assessment Lower Extremity Assessment: Defer to PT evaluation       Communication Communication Communication: No difficulties   Cognition Arousal/Alertness: Awake/alert Behavior During Therapy: WFL for tasks  assessed/performed Overall Cognitive Status: Within Functional Limits for tasks assessed     General Comments: patient was plesant and cooperative                Home Living Family/patient expects to be discharged to:: Skilled nursing facility       Additional Comments: [facility]      Prior Functioning/Environment     Mobility Comments: pt reports mod I with hemi walker ADLs Comments: pt reports sup-MOD I for dressing; MIN A for bathing with shower chair, I for feeding        OT Problem List: Decreased activity tolerance;Decreased coordination;Decreased safety awareness;Impaired UE functional use      OT Treatment/Interventions: Self-care/ADL training;Patient/family education;Therapeutic activities    OT Goals(Current goals can be found in the care plan section) Acute Rehab OT Goals Patient Stated Goal: to get LUE better OT Goal Formulation: With patient Time For Goal Achievement: 01/01/23 Potential to Achieve Goals: Fair  OT Frequency:         AM-PAC OT "6 Clicks" Daily Activity     Outcome Measure Help from another person eating meals?: None Help from another person taking care of personal grooming?: A Little Help from another person toileting, which includes using toliet, bedpan, or urinal?: A Little Help from another person bathing (including washing, rinsing, drying)?: A Little Help from another person to put on and taking off regular upper body clothing?: A Little Help from another person to put on and taking off regular lower body clothing?: A Little 6 Click Score: 19   End of Session Equipment Utilized During Treatment: Rolling walker (2 wheels) Nurse Communication: Other (comment) (ok to participate, updated on patients status and vitals after session.)  Activity Tolerance: Patient tolerated treatment well Patient left: in chair;with call bell/phone within reach;with chair alarm set  OT Visit Diagnosis: Unsteadiness on feet (R26.81);Muscle  weakness (generalized) (M62.81);Other abnormalities of gait and mobility (R26.89)                Time: [id number] OT Time Calculation (min): 24 min Charges:  OT General Charges $OT Visit: 1 Visit OT Evaluation $OT Eval Low Complexity: 1 Low OT Treatments $Self Care/Home Management : 8-22 mins  Rennie Plowman, MS Acute Rehabilitation Department Office# [phone]   Willa Rough 12/18/2022, 10:48 AM

## 2022-12-18 NOTE — Hospital Course (Signed)
Patient with h/o COPD on home O2 qhs, stage 3a CKD, DM, HTN, h/o VTE, hypothyroidism, HLD, chronic diastolic dysfunction, h/o CVA with L--sided deficits, and recurrent ESBL UTI who presented from SNF with fever and SOB.

## 2022-12-18 NOTE — Progress Notes (Signed)
PT Cancellation Note  Patient Details Name: Karla Hoover MRN: [id number] DOB: [birth year]   Cancelled Treatment:    Reason Eval/Treat Not Completed: Pt is LTC SNF resident-set to return to SNF. Will sign off.    [facility]  Office: [phone]

## 2022-12-18 NOTE — Discharge Summary (Addendum)
Physician Discharge Summary   Patient: Karla Hoover MRN: [id number] DOB: [birth year]  Admit date:     12/13/2022  Discharge date: 12/18/22  Discharge Physician: Karmen Bongo   PCP: Pcp, No   Recommendations at discharge:   Complete antibiotics as prescribed Add probiotics as prescribed  Discharge Diagnoses: Principal Problem:   Sepsis due to urinary tract infection ([place]) Active Problems:   Chronic respiratory failure with hypoxia (HCC)   COPD with chronic bronchitis ([place])   Essential hypertension   Hemiparesis affecting left side as late effect of cerebrovascular accident (CVA) ([place])   DM (diabetes mellitus), type 2 ([place])   hx of PE/DVT   Grade I diastolic dysfunction   CKD (chronic kidney disease) stage 3, GFR 30-59 ml/min (HCC)   GERD without esophagitis   Hypothyroidism   Class 1 obesity due to excess calories with body mass index (BMI) of 32.0 to 32.9 in adult   Dyslipidemia  Resolved Problems:   Chronic constipation   COPD (chronic obstructive pulmonary disease) [facility])  Hospital Course: Patient with h/o COPD on home O2 qhs, stage 3a CKD, DM, HTN, h/o VTE, hypothyroidism, HLD, chronic diastolic dysfunction, h/o CVA with L--sided deficits, and recurrent ESBL UTI who presented from SNF with fever and SOB.    Assessment and Plan: * Sepsis due to urinary tract infection ([place]) 52 year old female with hx of ESBL UTI presented to ED with complaints of weakness and fever found to be septic and urine as likely source -hx of ESBL UTI in 08/30/2022 and 09/06/2022  -Blood culture positive for ESBL E coli sensitive to Cipro; urine culture negative but this is likely the source -Transitioned to PO Cipro on 3/12 -lactic acidosis resolved -Will dc back to SNF today  Chronic respiratory failure with hypoxia ([place]) -On 2L PRN at SNF, stable   COPD with chronic bronchitis (HCC) -Continue symbicort, spiriva, duoneb and SABA prn   Essential hypertension -BP soft initially -Has  been stable -Continue Toprol XL (changed from atenolol-chlorthalidone)  Hemiparesis affecting left side as late effect of cerebrovascular accident (CVA) ([place]) -Continue ASA, statin and PRN flexeril  -PT/OR consults prior to dc  DM (diabetes mellitus), type 2 ([place]) -A1C of 10.5 in [id number] -Resume Trulicity -Continue glargine and SSI at SNF -Continue gabapentin  hx of PE/DVT -Continue eliquis  Grade I diastolic dysfunction -Echo 05/2021: EF of 55-60%. Normal LVF. Grade 1 DD -Appears compensated at this time   CKD (chronic kidney disease) stage 3, GFR 30-59 ml/min (HCC) -Stable on last check  GERD without esophagitis -Continue prevacid '30mg'$  daily  -Continue probiotics  Hypothyroidism -TSH normal -Continue synthroid 53mg daily   Dyslipidemia -Continue fish oil, atorvastatin, Zetia  Class 1 obesity due to excess calories with body mass index (BMI) of 32.0 to 32.9 in adult -Body mass index is 32.31 kg/m..  -Ongoing weight gain may negatively impact her health, but weight loss will be difficult given her physical impairments      Consultants: PT/OT; TOC team Procedures performed: None  Disposition: Skilled nursing facility Diet recommendation:  Carb modified diet DISCHARGE MEDICATION: Allergies as of 12/18/2022       Reactions   Guaifenesin Anaphylaxis   Kiwi Extract Anaphylaxis, Rash   Levaquin [levofloxacin] Shortness Of Breath, Rash   Strawberry Extract Anaphylaxis, Rash   Lioresal [baclofen] Other (See Comments)   Near syncope/ fall        Medication List     STOP taking these medications    atenolol-chlorthalidone 100-25 MG tablet  Commonly known as: TENORETIC   busPIRone 5 MG tablet Commonly known as: BUSPAR       TAKE these medications    acetaminophen 500 MG tablet Commonly known as: TYLENOL Take 1,000 mg by mouth in the morning, at noon, and at bedtime.   acidophilus Caps capsule Take 1 capsule by mouth daily. Start taking on: December 19, 2022   albuterol 108 (90 Base) MCG/ACT inhaler Commonly known as: VENTOLIN HFA Inhale 2 puffs into the lungs every 6 (six) hours as needed for wheezing or shortness of breath.   apixaban 5 MG Tabs tablet Commonly known as: Eliquis Take 1 tablet ('5mg'$ ) twice daily   aspirin 81 MG chewable tablet Chew 81 mg by mouth in the morning.   atorvastatin 80 MG tablet Commonly known as: LIPITOR Take 1 tablet (80 mg total) by mouth daily. What changed: when to take this   B Complex-Biotin-FA Tabs Take 1 tablet by mouth in the morning.   chlorhexidine 0.12 % solution Commonly known as: PERIDEX Use as directed 15 mLs in the mouth or throat every morning. Rinse and spit after oral care. Do not swallow.   ciprofloxacin 250 MG tablet Commonly known as: CIPRO Take 3 tablets (750 mg total) by mouth 2 (two) times daily for 5 doses. What changed:  medication strength how much to take   Cranberry 250 MG Tabs Take 250 mg by mouth at bedtime.   cyclobenzaprine 10 MG tablet Commonly known as: FLEXERIL Take 1 tablet (10 mg total) by mouth 3 (three) times daily as needed for muscle spasms. What changed: when to take this   EPINEPHrinesnap 1 MG/ML Kit Inject 1 mg as directed as needed (allergic reaction).   ergocalciferol 1.25 MG (50000 UT) capsule Commonly known as: VITAMIN D2 Take 50,000 Units by mouth every Thursday.   eucerin cream Apply 1 Application topically See admin instructions. Apply topically once daily and as needed for dry skin.   ezetimibe 10 MG tablet Commonly known as: ZETIA Take 10 mg by mouth at bedtime.   Fish Oil 1000 MG Caps Take 1,000 mg by mouth 3 (three) times daily.   gabapentin 400 MG capsule Commonly known as: NEURONTIN Take 1 capsule (400 mg total) by mouth 3 (three) times daily.   insulin glargine 100 UNIT/ML injection Commonly known as: LANTUS Inject 0.1 mLs (10 Units total) into the skin 2 (two) times daily. (discard vial 28 days after first  use) What changed: how much to take   insulin lispro 100 UNIT/ML injection Commonly known as: HUMALOG Inject 0-14 Units into the skin See admin instructions. Inject 0-14 units twice daily as per sliding scale : CBG 0-200 : 0 units CBG 201-250 : 2 units CBG 251-300 : 4 units CBG 301-350 : 6 units CBG 351-400 : 8 units CBG 401-450 : 10 units CBG 451-500 : 12 units (if BP reads high, inject 14 units)   ipratropium-albuterol 0.5-2.5 (3) MG/3ML Soln Commonly known as: DUONEB Inhale 1 vial via nebulizer 3 (three) times daily. What changed:  when to take this additional instructions   lansoprazole 30 MG capsule Commonly known as: PREVACID Take 30 mg by mouth every morning.   levothyroxine 75 MCG tablet Commonly known as: SYNTHROID Take 75 mcg by mouth in the morning. (0000)   magnesium oxide 400 (240 Mg) MG tablet Commonly known as: MAG-OX Take 400 mg by mouth daily.   Melatonin 5 MG Tbdp Take 5 mg by mouth at bedtime.   Menthol (Topical Analgesic)  5 % Gel Apply 1 application  topically in the morning. To neck   metoprolol succinate 50 MG 24 hr tablet Commonly known as: TOPROL-XL Take 50 mg by mouth in the morning.   Nasal Moist Gel Place 1 application  into both nostrils at bedtime.   OXYGEN Inhale 2 L/min into the lungs as needed (for shortness of breath).   Spiriva Respimat 2.5 MCG/ACT Aers Generic drug: Tiotropium Bromide Monohydrate Inhale 2.5 mcg into the lungs in the morning.   Symbicort 80-4.5 MCG/ACT inhaler Generic drug: budesonide-formoterol Inhale 2 puffs into the lungs 2 (two) times daily.   Trulicity [id number] Sopn Generic drug: Dulaglutide Inject 0.75 mg into the skin every Friday.        Discharge Exam:  Subjective:  reports feeling better.  No pain.  Feels ready to go if ok.  I spoke with her mother, who is also in agreement with dc back to SNF today.  Filed Weights   12/16/22 0519 12/17/22 0533 12/18/22 0648  Weight: 93.2 kg 93.3  kg 90.8 kg   General:  Appears calm and comfortable and is in NAD, pleasant Eyes:  EOMI, normal lids, iris ENT:  grossly normal hearing, lips & tongue, mmm Neck:  no LAD, masses or thyromegaly Cardiovascular:  RRR, no m/r/g. No LE edema.  Respiratory:   CTA bilaterally with no wheezes/rales/rhonchi.  Normal respiratory effort. Abdomen:  soft, NT, ND Skin:  no rash or induration seen on limited exam Musculoskeletal:  L hemiparesis Psychiatric:  blunted but pleasant mood and affect, speech fluent and appropriate, AOx3 Neurologic:  CN 2-12 grossly intact, L hemiparesis   Radiological Exams on Admission: Independently reviewed - see discussion in A/P where applicable  No results found.  EKG: none since 3/8   Labs on Admission: I have personally reviewed the available labs and imaging studies at the time of the admission.  Pertinent labs:  None since 3/11 (other than accuchecks, all 180 or less)   Condition at discharge: stable  The results of significant diagnostics from this hospitalization (including imaging, microbiology, ancillary and laboratory) are listed below for reference.   Imaging Studies: CT HEAD WO CONTRAST (5MM)  Result Date: 12/13/2022 CLINICAL DATA:  Headache.  Sudden severe. EXAM: CT HEAD WITHOUT CONTRAST TECHNIQUE: Contiguous axial images were obtained from the base of the skull through the vertex without intravenous contrast. RADIATION DOSE REDUCTION: This exam was performed according to the departmental dose-optimization program which includes automated exposure control, adjustment of the mA and/or kV according to patient size and/or use of iterative reconstruction technique. COMPARISON:  CT Head 11/09/21 FINDINGS: Brain: No evidence of acute infarction, hemorrhage, hydrocephalus, extra-axial collection or mass lesion/mass effect. Redemonstrated chronic infarct in the right basal ganglia, posterior right frontal lobe, and right parietal lobe. Vascular: No hyperdense  vessel or unexpected calcification. Skull: Normal. Negative for fracture or focal lesion. Sinuses/Orbits: No middle ear or mastoid effusion. Paranasal sinuses are clear. Bilateral orbits are unremarkable. Other: None. IMPRESSION: 1. No acute intracranial abnormality. 2. Redemonstrated chronic infarcts in the right MCA territory involving the right basal ganglia, posterior right frontal lobe, and right parietal lobe. Electronically Signed   By: Marin Roberts M.D.   On: 12/13/2022 14:13   DG Chest Port 1 View  Result Date: 12/13/2022 CLINICAL DATA:  Questionable sepsis EXAM: PORTABLE CHEST 1 VIEW COMPARISON:  Chest radiographs dated September 06, 2022 FINDINGS: The heart size and mediastinal contours are within normal limits. Both lungs are clear. The visualized skeletal structures are  unremarkable. IMPRESSION: No active disease. Electronically Signed   By: Keane Police D.O.   On: 12/13/2022 11:14    Microbiology: Results for orders placed or performed during the hospital encounter of 12/13/22  Blood Culture (routine x 2)     Status: None   Collection Time: 12/13/22 10:52 AM   Specimen: BLOOD  Result Value Ref Range Status   Specimen Description   Final    BLOOD BLOOD LEFT ARM Performed at [facility] [address], [place], [place] [address]    Special Requests   Final    BOTTLES DRAWN AEROBIC AND ANAEROBIC Blood Culture results may not be optimal due to an excessive volume of blood received in culture bottles Performed at [facility] [address], [place], [place] [address]    Culture   Final    NO GROWTH 5 DAYS Performed at [facility], [place] [address], [place], [place] [address]    Report Status 12/18/2022 FINAL  Final  Urine Culture     Status: None   Collection Time: 12/13/22 10:52 AM   Specimen: Urine, Random  Result Value Ref Range Status   Specimen Description   Final    URINE, RANDOM Performed at [facility] [address] [place], [place] [address]    Special Requests URINE, CLEAN CATCH  Final   Culture   Final    NO GROWTH Performed at [facility], [place] [address], [place], [place] [address]    Report Status 12/15/2022 FINAL  Final  Blood Culture (routine x 2)     Status: Abnormal   Collection Time: 12/13/22 10:57 AM   Specimen: BLOOD  Result Value Ref Range Status   Specimen Description   Final    BLOOD RFOA Performed at [facility] [address] [place], [place] [address]    Special Requests   Final    BOTTLES DRAWN AEROBIC AND ANAEROBIC Blood Culture adequate volume Performed at [facility] [address] [place], [place] [address]    Culture  Setup Time   Final    GRAM NEGATIVE RODS ANAEROBIC BOTTLE ONLY CRITICAL RESULT CALLED TO, READ BACK BY AND VERIFIED WITH: PHARMD E. JACKSON 12/14/22 @ 0517 BY AB Performed at [facility], [place] [address] [place],  [address]    Culture (A)  Final    ESCHERICHIA COLI Confirmed Extended Spectrum Beta-Lactamase Producer (ESBL).  In bloodstream infections from ESBL organisms, carbapenems are preferred over piperacillin/tazobactam. They are shown to have a lower risk of mortality.    Report Status 12/16/2022 FINAL  Final   Organism ID, Bacteria ESCHERICHIA COLI  Final      Susceptibility   Escherichia coli - MIC*    AMPICILLIN >=32 RESISTANT Resistant     CEFEPIME 16 RESISTANT Resistant     CEFTAZIDIME RESISTANT Resistant     CEFTRIAXONE >=64 RESISTANT Resistant     CIPROFLOXACIN <=0.25 SENSITIVE Sensitive     GENTAMICIN >=16 RESISTANT Resistant     IMIPENEM <=0.25 SENSITIVE Sensitive     TRIMETH/SULFA >=320 RESISTANT Resistant     AMPICILLIN/SULBACTAM >=32 RESISTANT Resistant     PIP/TAZO <=4 SENSITIVE Sensitive     * ESCHERICHIA COLI  Blood Culture ID Panel (Reflexed)     Status: Abnormal   Collection Time: 12/13/22 10:57 AM  Result Value Ref Range Status    Enterococcus faecalis NOT DETECTED NOT DETECTED Final   Enterococcus  Faecium NOT DETECTED NOT DETECTED Final   Listeria monocytogenes NOT DETECTED NOT DETECTED Final   Staphylococcus species NOT DETECTED NOT DETECTED Final   Staphylococcus aureus (BCID) NOT DETECTED NOT DETECTED Final   Staphylococcus epidermidis NOT DETECTED NOT DETECTED Final   Staphylococcus lugdunensis NOT DETECTED NOT DETECTED Final   Streptococcus species NOT DETECTED NOT DETECTED Final   Streptococcus agalactiae NOT DETECTED NOT DETECTED Final   Streptococcus pneumoniae NOT DETECTED NOT DETECTED Final   Streptococcus pyogenes NOT DETECTED NOT DETECTED Final   A.calcoaceticus-baumannii NOT DETECTED NOT DETECTED Final   Bacteroides fragilis NOT DETECTED NOT DETECTED Final   Enterobacterales DETECTED (A) NOT DETECTED Final    Comment: Enterobacterales represent a large order of gram negative bacteria, not a single organism. CRITICAL RESULT CALLED TO, READ BACK BY AND VERIFIED WITH: PHARMD E. JACKSON 12/14/22 @ 0517 BY AB    Enterobacter cloacae complex NOT DETECTED NOT DETECTED Final   Escherichia coli DETECTED (A) NOT DETECTED Final    Comment: CRITICAL RESULT CALLED TO, READ BACK BY AND VERIFIED WITH: PHARMD E. JACKSON 12/14/22 @ 0517 BY AB    Klebsiella aerogenes NOT DETECTED NOT DETECTED Final   Klebsiella oxytoca NOT DETECTED NOT DETECTED Final   Klebsiella pneumoniae NOT DETECTED NOT DETECTED Final   Proteus species NOT DETECTED NOT DETECTED Final   Salmonella species NOT DETECTED NOT DETECTED Final   Serratia marcescens NOT DETECTED NOT DETECTED Final   Haemophilus influenzae NOT DETECTED NOT DETECTED Final   Neisseria meningitidis NOT DETECTED NOT DETECTED Final   Pseudomonas aeruginosa NOT DETECTED NOT DETECTED Final   Stenotrophomonas maltophilia NOT DETECTED NOT DETECTED Final   Candida albicans NOT DETECTED NOT DETECTED Final   Candida auris NOT DETECTED NOT DETECTED Final   Candida glabrata NOT  DETECTED NOT DETECTED Final   Candida krusei NOT DETECTED NOT DETECTED Final   Candida parapsilosis NOT DETECTED NOT DETECTED Final   Candida tropicalis NOT DETECTED NOT DETECTED Final   Cryptococcus neoformans/gattii NOT DETECTED NOT DETECTED Final   CTX-M ESBL DETECTED (A) NOT DETECTED Final    Comment: CRITICAL RESULT CALLED TO, READ BACK BY AND VERIFIED WITH: PHARMD E. JACKSON 12/14/22 @ 0517 BY AB (NOTE) Extended spectrum beta-lactamase detected. Recommend a carbapenem as initial therapy.      Carbapenem resistance IMP NOT DETECTED NOT DETECTED Final   Carbapenem resistance KPC NOT DETECTED NOT DETECTED Final   Carbapenem resistance NDM NOT DETECTED NOT DETECTED Final   Carbapenem resist OXA 48 LIKE NOT DETECTED NOT DETECTED Final   Carbapenem resistance VIM NOT DETECTED NOT DETECTED Final    Comment: Performed at [facility], [place] [address] [place], [place] [address]  Resp panel by RT-PCR (RSV, Flu A&B, Covid) Anterior Nasal Swab     Status: None   Collection Time: 12/13/22 11:00 AM   Specimen: Anterior Nasal Swab  Result Value Ref Range Status   SARS Coronavirus 2 by RT PCR NEGATIVE NEGATIVE Final    Comment: (NOTE) SARS-CoV-2 target nucleic acids are NOT DETECTED.  The SARS-CoV-2 RNA is generally detectable in upper respiratory specimens during the acute phase of infection. The lowest concentration of SARS-CoV-2 viral copies this assay can detect is 138 copies/mL. A negative result does not preclude SARS-Cov-2 infection and should not be used as the sole basis for treatment or other patient management decisions. A negative result may occur with  improper specimen collection/handling, submission of specimen other than nasopharyngeal swab, presence of viral mutation(s) within the areas targeted by  this assay, and inadequate number of viral copies(<138 copies/mL). A negative result must be combined with clinical observations, patient history, and  epidemiological information. The expected result is Negative.  Fact Sheet for Patients:  EntrepreneurPulse.com.au  Fact Sheet for Healthcare Providers:  IncredibleEmployment.be  This test is no t yet approved or cleared by the [place] FDA and  has been authorized for detection and/or diagnosis of SARS-CoV-2 by FDA under an Emergency Use Authorization (EUA). This EUA will remain  in effect (meaning this test can be used) for the duration of the COVID-19 declaration under Section 564(b)(1) of the Act, 21 U.S.C.section 360bbb-3(b)(1), unless the authorization is terminated  or revoked sooner.       Influenza A by PCR NEGATIVE NEGATIVE Final   Influenza B by PCR NEGATIVE NEGATIVE Final    Comment: (NOTE) The Xpert Xpress SARS-CoV-2/FLU/RSV plus assay is intended as an aid in the diagnosis of influenza from Nasopharyngeal swab specimens and should not be used as a sole basis for treatment. Nasal washings and aspirates are unacceptable for Xpert Xpress SARS-CoV-2/FLU/RSV testing.  Fact Sheet for Patients: EntrepreneurPulse.com.au  Fact Sheet for Healthcare Providers: IncredibleEmployment.be  This test is not yet approved or cleared by the [place] FDA and has been authorized for detection and/or diagnosis of SARS-CoV-2 by FDA under an Emergency Use Authorization (EUA). This EUA will remain in effect (meaning this test can be used) for the duration of the COVID-19 declaration under Section 564(b)(1) of the Act, 21 U.S.C. section 360bbb-3(b)(1), unless the authorization is terminated or revoked.     Resp Syncytial Virus by PCR NEGATIVE NEGATIVE Final    Comment: (NOTE) Fact Sheet for Patients: EntrepreneurPulse.com.au  Fact Sheet for Healthcare Providers: IncredibleEmployment.be  This test is not yet approved or cleared by the [place] FDA and has been  authorized for detection and/or diagnosis of SARS-CoV-2 by FDA under an Emergency Use Authorization (EUA). This EUA will remain in effect (meaning this test can be used) for the duration of the COVID-19 declaration under Section 564(b)(1) of the Act, 21 U.S.C. section 360bbb-3(b)(1), unless the authorization is terminated or revoked.  Performed at [facility], [place] [address] [place], [place] [address]       Discharge time spent: greater than 30 minutes.  Signed: Karmen Bongo, MD [facility] 12/18/2022

## 2022-12-18 NOTE — Assessment & Plan Note (Signed)
-  Body mass index is 32.31 kg/m..  -Ongoing weight gain may negatively impact her health, but weight loss will be difficult given her physical impairments

## 2022-12-18 NOTE — Assessment & Plan Note (Signed)
-  Continue fish oil, atorvastatin, Zetia

## 2022-12-30 ENCOUNTER — Encounter: Payer: Self-pay | Admitting: Nurse Practitioner

## 2023-01-21 ENCOUNTER — Encounter (HOSPITAL_COMMUNITY): Payer: Self-pay

## 2023-01-21 ENCOUNTER — Emergency Department (HOSPITAL_COMMUNITY): Payer: Medicaid Other

## 2023-01-21 ENCOUNTER — Inpatient Hospital Stay (HOSPITAL_COMMUNITY)
Admission: EM | Admit: 2023-01-21 | Discharge: 2023-01-23 | DRG: 872 | Disposition: A | Discharge status: Skilled Nursing Facility | Payer: Medicaid Other | Source: Skilled Nursing Facility | Attending: Internal Medicine | Admitting: Internal Medicine

## 2023-01-21 ENCOUNTER — Other Ambulatory Visit: Payer: Self-pay

## 2023-01-21 DIAGNOSIS — I69354 Hemiplegia and hemiparesis following cerebral infarction affecting left non-dominant side: Secondary | ICD-10-CM

## 2023-01-21 DIAGNOSIS — I5032 Chronic diastolic (congestive) heart failure: Secondary | ICD-10-CM | POA: Diagnosis present

## 2023-01-21 DIAGNOSIS — E039 Hypothyroidism, unspecified: Secondary | ICD-10-CM | POA: Diagnosis present

## 2023-01-21 DIAGNOSIS — J9611 Chronic respiratory failure with hypoxia: Secondary | ICD-10-CM | POA: Diagnosis present

## 2023-01-21 DIAGNOSIS — Z86718 Personal history of other venous thrombosis and embolism: Secondary | ICD-10-CM

## 2023-01-21 DIAGNOSIS — Z7982 Long term (current) use of aspirin: Secondary | ICD-10-CM

## 2023-01-21 DIAGNOSIS — N1831 Chronic kidney disease, stage 3a: Secondary | ICD-10-CM

## 2023-01-21 DIAGNOSIS — I10 Essential (primary) hypertension: Secondary | ICD-10-CM | POA: Diagnosis present

## 2023-01-21 DIAGNOSIS — A4151 Sepsis due to Escherichia coli [E. coli]: Principal | ICD-10-CM | POA: Diagnosis present

## 2023-01-21 DIAGNOSIS — E782 Mixed hyperlipidemia: Secondary | ICD-10-CM | POA: Diagnosis present

## 2023-01-21 DIAGNOSIS — D72829 Elevated white blood cell count, unspecified: Secondary | ICD-10-CM | POA: Diagnosis present

## 2023-01-21 DIAGNOSIS — N390 Urinary tract infection, site not specified: Secondary | ICD-10-CM | POA: Diagnosis not present

## 2023-01-21 DIAGNOSIS — Z79890 Hormone replacement therapy (postmenopausal): Secondary | ICD-10-CM

## 2023-01-21 DIAGNOSIS — L271 Localized skin eruption due to drugs and medicaments taken internally: Secondary | ICD-10-CM | POA: Diagnosis present

## 2023-01-21 DIAGNOSIS — N12 Tubulo-interstitial nephritis, not specified as acute or chronic: Secondary | ICD-10-CM | POA: Diagnosis present

## 2023-01-21 DIAGNOSIS — Z87892 Personal history of anaphylaxis: Secondary | ICD-10-CM

## 2023-01-21 DIAGNOSIS — R6520 Severe sepsis without septic shock: Secondary | ICD-10-CM | POA: Diagnosis present

## 2023-01-21 DIAGNOSIS — I129 Hypertensive chronic kidney disease with stage 1 through stage 4 chronic kidney disease, or unspecified chronic kidney disease: Secondary | ICD-10-CM | POA: Diagnosis present

## 2023-01-21 DIAGNOSIS — Z833 Family history of diabetes mellitus: Secondary | ICD-10-CM

## 2023-01-21 DIAGNOSIS — I8290 Acute embolism and thrombosis of unspecified vein: Secondary | ICD-10-CM | POA: Diagnosis present

## 2023-01-21 DIAGNOSIS — I5189 Other ill-defined heart diseases: Secondary | ICD-10-CM

## 2023-01-21 DIAGNOSIS — Z888 Allergy status to other drugs, medicaments and biological substances status: Secondary | ICD-10-CM

## 2023-01-21 DIAGNOSIS — Z881 Allergy status to other antibiotic agents status: Secondary | ICD-10-CM

## 2023-01-21 DIAGNOSIS — K219 Gastro-esophageal reflux disease without esophagitis: Secondary | ICD-10-CM | POA: Diagnosis present

## 2023-01-21 DIAGNOSIS — E1169 Type 2 diabetes mellitus with other specified complication: Secondary | ICD-10-CM | POA: Diagnosis present

## 2023-01-21 DIAGNOSIS — J4489 Other specified chronic obstructive pulmonary disease: Secondary | ICD-10-CM | POA: Diagnosis present

## 2023-01-21 DIAGNOSIS — Z87891 Personal history of nicotine dependence: Secondary | ICD-10-CM

## 2023-01-21 DIAGNOSIS — Z7901 Long term (current) use of anticoagulants: Secondary | ICD-10-CM

## 2023-01-21 DIAGNOSIS — Z7985 Long-term (current) use of injectable non-insulin antidiabetic drugs: Secondary | ICD-10-CM | POA: Diagnosis not present

## 2023-01-21 DIAGNOSIS — L298 Other pruritus: Secondary | ICD-10-CM | POA: Diagnosis present

## 2023-01-21 DIAGNOSIS — Z79899 Other long term (current) drug therapy: Secondary | ICD-10-CM

## 2023-01-21 DIAGNOSIS — T368X5A Adverse effect of other systemic antibiotics, initial encounter: Secondary | ICD-10-CM | POA: Diagnosis present

## 2023-01-21 DIAGNOSIS — Z794 Long term (current) use of insulin: Secondary | ICD-10-CM | POA: Diagnosis not present

## 2023-01-21 DIAGNOSIS — E785 Hyperlipidemia, unspecified: Secondary | ICD-10-CM | POA: Diagnosis present

## 2023-01-21 DIAGNOSIS — Z7951 Long term (current) use of inhaled steroids: Secondary | ICD-10-CM

## 2023-01-21 DIAGNOSIS — Z86711 Personal history of pulmonary embolism: Secondary | ICD-10-CM

## 2023-01-21 DIAGNOSIS — A419 Sepsis, unspecified organism: Secondary | ICD-10-CM | POA: Diagnosis not present

## 2023-01-21 DIAGNOSIS — E1122 Type 2 diabetes mellitus with diabetic chronic kidney disease: Secondary | ICD-10-CM

## 2023-01-21 DIAGNOSIS — Z825 Family history of asthma and other chronic lower respiratory diseases: Secondary | ICD-10-CM

## 2023-01-21 DIAGNOSIS — Z91018 Allergy to other foods: Secondary | ICD-10-CM

## 2023-01-21 DIAGNOSIS — Z8249 Family history of ischemic heart disease and other diseases of the circulatory system: Secondary | ICD-10-CM

## 2023-01-21 LAB — URINALYSIS, W/ REFLEX TO CULTURE (INFECTION SUSPECTED)
Bilirubin Urine: NEGATIVE
Glucose, UA: 150 mg/dL — AB
Ketones, ur: NEGATIVE mg/dL
Nitrite: NEGATIVE
Protein, ur: 100 mg/dL — AB
Specific Gravity, Urine: 1.013 (ref 1.005–1.030)
WBC, UA: >50 WBC/hpf (ref 0–5)
pH: 5 (ref 5.0–8.0)

## 2023-01-21 LAB — BLOOD GAS, VENOUS
Acid-Base Excess: 3.3 mmol/L — ABNORMAL HIGH (ref 0.0–2.0)
Bicarbonate: 29.1 mmol/L — ABNORMAL HIGH (ref 20.0–28.0)
O2 Saturation: 92 %
Patient temperature: 36.1
pCO2, Ven: 46 mmHg (ref 44–60)
pH, Ven: 7.4 (ref 7.25–7.43)
pO2, Ven: 56 mmHg — ABNORMAL HIGH (ref 32–45)

## 2023-01-21 LAB — LACTIC ACID, PLASMA
Lactic Acid, Venous: 1.9 mmol/L (ref 0.5–1.9)
Lactic Acid, Venous: 2.8 mmol/L (ref 0.5–1.9)
Lactic Acid, Venous: 1.5 mmol/L (ref 0.5–1.9)

## 2023-01-21 LAB — COMPREHENSIVE METABOLIC PANEL
ALT: 25 U/L (ref 0–44)
AST: 42 U/L — ABNORMAL HIGH (ref 15–41)
Albumin: 3.4 g/dL — ABNORMAL LOW (ref 3.5–5.0)
Alkaline Phosphatase: 81 U/L (ref 38–126)
Anion gap: 11 (ref 5–15)
BUN: 15 mg/dL (ref 6–20)
CO2: 25 mmol/L (ref 22–32)
Calcium: 9.3 mg/dL (ref 8.9–10.3)
Chloride: 97 mmol/L — ABNORMAL LOW (ref 98–111)
Creatinine, Ser: 1.15 mg/dL — ABNORMAL HIGH (ref 0.44–1.00)
GFR, Estimated: 58 mL/min — ABNORMAL LOW (ref >60)
Glucose, Bld: 339 mg/dL — ABNORMAL HIGH (ref 70–99)
Potassium: 4.8 mmol/L (ref 3.5–5.1)
Sodium: 133 mmol/L — ABNORMAL LOW (ref 135–145)
Total Bilirubin: 0.7 mg/dL (ref 0.3–1.2)
Total Protein: 7.2 g/dL (ref 6.5–8.1)

## 2023-01-21 LAB — PROTIME-INR
INR: 1.3 — ABNORMAL HIGH (ref 0.8–1.2)
Prothrombin Time: 16.3 seconds — ABNORMAL HIGH (ref 11.4–15.2)

## 2023-01-21 LAB — CBC WITH DIFFERENTIAL/PLATELET
Abs Immature Granulocytes: 0.03 10*3/uL (ref 0.00–0.07)
Basophils Absolute: 0.1 10*3/uL (ref 0.0–0.1)
Basophils Relative: 0 %
Eosinophils Absolute: 0.1 10*3/uL (ref 0.0–0.5)
Eosinophils Relative: 1 %
HCT: 37.9 % (ref 36.0–46.0)
Hemoglobin: 12.1 g/dL (ref 12.0–15.0)
Immature Granulocytes: 0 %
Lymphocytes Relative: 15 %
Lymphs Abs: 1.9 10*3/uL (ref 0.7–4.0)
MCH: 26.7 pg (ref 26.0–34.0)
MCHC: 31.9 g/dL (ref 30.0–36.0)
MCV: 83.5 fL (ref 80.0–100.0)
Monocytes Absolute: 0.6 10*3/uL (ref 0.1–1.0)
Monocytes Relative: 5 %
Neutro Abs: 10.4 10*3/uL — ABNORMAL HIGH (ref 1.7–7.7)
Neutrophils Relative %: 79 %
Platelets: 196 10*3/uL (ref 150–400)
RBC: 4.54 MIL/uL (ref 3.87–5.11)
RDW: 14.1 % (ref 11.5–15.5)
WBC: 13.1 10*3/uL — ABNORMAL HIGH (ref 4.0–10.5)
nRBC: 0 % (ref 0.0–0.2)

## 2023-01-21 LAB — GLUCOSE, CAPILLARY
Glucose-Capillary: 270 mg/dL — ABNORMAL HIGH (ref 70–99)
Glucose-Capillary: 248 mg/dL — ABNORMAL HIGH (ref 70–99)
Glucose-Capillary: 207 mg/dL — ABNORMAL HIGH (ref 70–99)
Glucose-Capillary: 250 mg/dL — ABNORMAL HIGH (ref 70–99)

## 2023-01-21 LAB — APTT: aPTT: 30 seconds (ref 24–36)

## 2023-01-21 LAB — CBG MONITORING, ED: Glucose-Capillary: 326 mg/dL — ABNORMAL HIGH (ref 70–99)

## 2023-01-21 LAB — BETA-HYDROXYBUTYRIC ACID: Beta-Hydroxybutyric Acid: 0.21 mmol/L (ref 0.05–0.27)

## 2023-01-21 MED ORDER — INSULIN GLARGINE-YFGN 100 UNIT/ML ~~LOC~~ SOLN: 5.0000 [IU] | Freq: Two times a day (BID) | SUBCUTANEOUS | Status: DC

## 2023-01-21 MED ORDER — ACETAMINOPHEN 650 MG RE SUPP: 650.0000 mg | Freq: Four times a day (QID) | RECTAL | Status: DC | PRN

## 2023-01-21 MED ORDER — ONDANSETRON HCL 4 MG/2ML IJ SOLN: 4.0000 mg | Freq: Four times a day (QID) | INTRAMUSCULAR | Status: DC | PRN

## 2023-01-21 MED ORDER — SODIUM CHLORIDE 0.9 % IV SOLN
1000.0000 mL | INTRAVENOUS | Status: AC
  Administered 2023-01-21 (×2): 1000 mL via INTRAVENOUS

## 2023-01-21 MED ORDER — ASPIRIN 81 MG PO CHEW
81.0000 mg | CHEWABLE_TABLET | Freq: Every morning | ORAL | Status: DC
  Administered 2023-01-21 – 2023-01-23 (×3): 81 mg via ORAL
  Filled 2023-01-21 (×3): qty 1

## 2023-01-21 MED ORDER — ALBUTEROL SULFATE (2.5 MG/3ML) 0.083% IN NEBU: 2.5000 mg | INHALATION_SOLUTION | RESPIRATORY_TRACT | Status: DC | PRN

## 2023-01-21 MED ORDER — INSULIN GLARGINE-YFGN 100 UNIT/ML ~~LOC~~ SOLN
10.0000 [IU] | Freq: Two times a day (BID) | SUBCUTANEOUS | Status: DC
  Administered 2023-01-21 – 2023-01-22 (×3): 10 [IU] via SUBCUTANEOUS
  Filled 2023-01-21 (×4): qty 0.1

## 2023-01-21 MED ORDER — TIOTROPIUM BROMIDE MONOHYDRATE 2.5 MCG/ACT IN AERS: 2.5000 ug | INHALATION_SPRAY | Freq: Every morning | RESPIRATORY_TRACT | Status: DC

## 2023-01-21 MED ORDER — PANTOPRAZOLE SODIUM 20 MG PO TBEC
20.0000 mg | DELAYED_RELEASE_TABLET | Freq: Every day | ORAL | Status: DC
  Administered 2023-01-21 – 2023-01-23 (×3): 20 mg via ORAL
  Filled 2023-01-21 (×3): qty 1

## 2023-01-21 MED ORDER — CYCLOBENZAPRINE HCL 10 MG PO TABS
10.0000 mg | ORAL_TABLET | Freq: Three times a day (TID) | ORAL | Status: DC | PRN
  Administered 2023-01-22: 10 mg via ORAL
  Filled 2023-01-21: qty 1

## 2023-01-21 MED ORDER — INSULIN ASPART 100 UNIT/ML IJ SOLN
0.0000 [IU] | Freq: Every day | INTRAMUSCULAR | Status: DC
  Administered 2023-01-21 – 2023-01-22 (×2): 2 [IU] via SUBCUTANEOUS

## 2023-01-21 MED ORDER — INSULIN ASPART 100 UNIT/ML IJ SOLN
0.0000 [IU] | Freq: Three times a day (TID) | INTRAMUSCULAR | Status: DC
  Administered 2023-01-21 (×2): 5 [IU] via SUBCUTANEOUS
  Administered 2023-01-22: 3 [IU] via SUBCUTANEOUS
  Administered 2023-01-22: 5 [IU] via SUBCUTANEOUS
  Administered 2023-01-22: 3 [IU] via SUBCUTANEOUS
  Administered 2023-01-23 (×2): 5 [IU] via SUBCUTANEOUS

## 2023-01-21 MED ORDER — ACETAMINOPHEN 500 MG PO TABS
1000.0000 mg | ORAL_TABLET | Freq: Once | ORAL | Status: AC
  Administered 2023-01-21: 1000 mg via ORAL
  Filled 2023-01-21: qty 2

## 2023-01-21 MED ORDER — ONDANSETRON HCL 4 MG PO TABS: 4.0000 mg | ORAL_TABLET | Freq: Four times a day (QID) | ORAL | Status: DC | PRN

## 2023-01-21 MED ORDER — SODIUM CHLORIDE 0.9 % IV BOLUS (SEPSIS)
1000.0000 mL | Freq: Once | INTRAVENOUS | Status: AC
  Administered 2023-01-21: 1000 mL via INTRAVENOUS

## 2023-01-21 MED ORDER — MOMETASONE FURO-FORMOTEROL FUM 100-5 MCG/ACT IN AERO
2.0000 | INHALATION_SPRAY | Freq: Two times a day (BID) | RESPIRATORY_TRACT | Status: DC
  Administered 2023-01-21 – 2023-01-23 (×4): 2 via RESPIRATORY_TRACT
  Filled 2023-01-21: qty 8.8

## 2023-01-21 MED ORDER — LEVOTHYROXINE SODIUM 75 MCG PO TABS
75.0000 ug | ORAL_TABLET | Freq: Every day | ORAL | Status: DC
  Administered 2023-01-22 – 2023-01-23 (×2): 75 ug via ORAL
  Filled 2023-01-21 (×2): qty 1

## 2023-01-21 MED ORDER — SENNOSIDES-DOCUSATE SODIUM 8.6-50 MG PO TABS: 1.0000 | ORAL_TABLET | Freq: Every evening | ORAL | Status: DC | PRN

## 2023-01-21 MED ORDER — UMECLIDINIUM BROMIDE 62.5 MCG/ACT IN AEPB
1.0000 | INHALATION_SPRAY | Freq: Every day | RESPIRATORY_TRACT | Status: DC
  Administered 2023-01-22 – 2023-01-23 (×2): 1 via RESPIRATORY_TRACT
  Filled 2023-01-21: qty 7

## 2023-01-21 MED ORDER — METOPROLOL SUCCINATE ER 50 MG PO TB24
50.0000 mg | ORAL_TABLET | Freq: Every morning | ORAL | Status: DC
  Administered 2023-01-21 – 2023-01-23 (×3): 50 mg via ORAL
  Filled 2023-01-21 (×3): qty 1

## 2023-01-21 MED ORDER — SODIUM CHLORIDE 0.9 % IV BOLUS
1000.0000 mL | Freq: Once | INTRAVENOUS | Status: AC
  Administered 2023-01-21: 1000 mL via INTRAVENOUS

## 2023-01-21 MED ORDER — METOPROLOL TARTRATE 5 MG/5ML IV SOLN: 5.0000 mg | Freq: Four times a day (QID) | INTRAVENOUS | Status: DC | PRN

## 2023-01-21 MED ORDER — APIXABAN 5 MG PO TABS
5.0000 mg | ORAL_TABLET | Freq: Two times a day (BID) | ORAL | Status: DC
  Administered 2023-01-21 – 2023-01-23 (×5): 5 mg via ORAL
  Filled 2023-01-21 (×5): qty 1

## 2023-01-21 MED ORDER — GABAPENTIN 400 MG PO CAPS
400.0000 mg | ORAL_CAPSULE | Freq: Three times a day (TID) | ORAL | Status: DC
  Administered 2023-01-21 – 2023-01-23 (×7): 400 mg via ORAL
  Filled 2023-01-21 (×7): qty 1

## 2023-01-21 MED ORDER — CIPROFLOXACIN IN D5W 400 MG/200ML IV SOLN
400.0000 mg | Freq: Once | INTRAVENOUS | Status: AC
  Administered 2023-01-21: 400 mg via INTRAVENOUS
  Filled 2023-01-21: qty 200

## 2023-01-21 MED ORDER — ACETAMINOPHEN 325 MG PO TABS
650.0000 mg | ORAL_TABLET | Freq: Four times a day (QID) | ORAL | Status: DC | PRN
  Administered 2023-01-21 – 2023-01-22 (×3): 650 mg via ORAL
  Filled 2023-01-21 (×3): qty 2

## 2023-01-21 MED ORDER — SODIUM CHLORIDE 0.9 % IV SOLN
1.0000 g | Freq: Three times a day (TID) | INTRAVENOUS | Status: DC
  Administered 2023-01-21 – 2023-01-23 (×6): 1 g via INTRAVENOUS
  Filled 2023-01-21 (×7): qty 20

## 2023-01-21 MED ORDER — DIPHENHYDRAMINE HCL 50 MG/ML IJ SOLN
25.0000 mg | Freq: Once | INTRAMUSCULAR | Status: AC
  Administered 2023-01-21: 25 mg via INTRAVENOUS
  Filled 2023-01-21: qty 1

## 2023-01-21 NOTE — TOC Initial Note (Signed)
Transition of Care [facility]) - Initial/Assessment Note    Patient Details  Name: Karla Hoover MRN: [id number] Date of Birth: 1970/11/13  Transition of Care [facility]) CM/SW Contact:    Otelia Santee, LCSW Phone Number: 01/21/2023, 12:11 PM  Clinical Narrative:                 Pt from [facility] where she is a LTC resident. Pt states she typically ambulates independently however, has wheelchair and hemi-walker as needed. Plan for pt to return to [facility] at discharge. TOC will follow for discharge planning and needs.   Expected Discharge Plan: Long Term Nursing Home Barriers to Discharge: Continued Medical Work up   Patient Goals and CMS Choice Patient states their goals for this hospitalization and ongoing recovery are:: To return to [facility] [place].gov Compare Post Acute Care list provided to:: Patient Choice offered to / list presented to : Patient [place] ownership interest in [facility].provided to:: Patient    Expected Discharge Plan and Services In-house Referral: NA Discharge Planning Services: NA Post Acute Care Choice: Resumption of Svcs/PTA Provider Living arrangements for the past 2 months: Skilled Nursing Facility                 DME Arranged: N/A DME Agency: NA                  Prior Living Arrangements/Services Living arrangements for the past 2 months: Skilled Nursing Facility Lives with:: Facility Resident Patient language and need for interpreter reviewed:: Yes Do you feel safe going back to the place where you live?: Yes      Need for Family Participation in Patient Care: No (Comment) Care giver support system in place?: Yes (comment) Current home services: DME (Wheelchair, Interior and spatial designer) Criminal Activity/Legal Involvement Pertinent to Current Situation/Hospitalization: No - Comment as needed  Activities of Daily Living      Permission Sought/Granted Permission sought to share information with : Facility Social worker, Family Supports Permission granted to share information with : Yes, Verbal Permission Granted  Share Information with NAME: Carollee Sires [phone]  Permission granted to share info w AGENCY: [facility]  Permission granted to share info w Relationship: Mother  Permission granted to share info w Contact Information: [phone]  Emotional Assessment Appearance:: Appears stated age Attitude/Demeanor/Rapport: Engaged Affect (typically observed): Accepting Orientation: : Oriented to Self, Oriented to Place, Oriented to  Time, Oriented to Situation Alcohol / Substance Use: Not Applicable Psych Involvement: No (comment)  Admission diagnosis:  UTI (urinary tract infection) [N39.0] Pyelonephritis [N12] Patient Active Problem List   Diagnosis Date Noted   UTI (urinary tract infection) 01/21/2023   Leukocytosis 01/21/2023   Class 1 obesity due to excess calories with body mass index (BMI) of 32.0 to 32.9 in adult 12/18/2022   Dyslipidemia 12/18/2022   Sepsis due to urinary tract infection 12/13/2022   Hyponatremia 12/13/2022   CKD (chronic kidney disease) stage 3, GFR 30-59 ml/min 12/13/2022   Sepsis 09/07/2022   AKI (acute kidney injury) 08/30/2022   History of pulmonary embolism 08/30/2022   Hemiparesis affecting left side as late effect of cerebrovascular accident (CVA) 08/30/2022   Allergies    Grade I diastolic dysfunction 12/02/2021   Mild protein-calorie malnutrition 12/02/2021   COPD with acute exacerbation 12/02/2021   Acute renal failure superimposed on stage 3a chronic kidney disease 02/09/2021   Chronic respiratory failure with hypoxia 02/09/2021   History of thromboembolism 02/09/2021   Type 2 diabetes mellitus with  stage 3a chronic kidney disease, with long-term current use of insulin 02/09/2021   Mixed 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 02/09/2021   GERD without esophagitis 02/09/2021   Hypothyroidism 02/09/2021   Sepsis  secondary to UTI 02/08/2021   Lymphadenopathy 12/28/2020   hx of PE/DVT 12/01/2019   Overweight (BMI 25.0-29.9) 11/22/2019   Tobacco abuse 11/16/2019   DM (diabetes mellitus), type 2 11/15/2019   History of stroke 11/15/2019   Essential hypertension 05/04/2009   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 [place] [address] Phone: [phone] Fax: [phone]     Social Determinants of Health (SDOH) Social History: SDOH Screenings   Food Insecurity: No Food Insecurity (12/13/2022)  Housing: Low Risk  (12/13/2022)  Transportation Needs: No Transportation Needs (12/13/2022)  Utilities: Not At Risk (12/13/2022)  Depression (PHQ2-9): Medium Risk (10/04/2019)  Tobacco Use: Medium Risk (01/21/2023)   SDOH Interventions:     Readmission Risk Interventions    01/21/2023   12:08 PM 12/18/2022   11:10 AM 01/04/2022    2:25 PM  Readmission Risk Prevention Plan  Transportation Screening Complete Complete Complete  PCP or Specialist Appt within 5-7 Days Complete    PCP or Specialist Appt within 3-5 Days  Complete Complete  Home Care Screening Complete    Medication Review (RN CM) Complete    HRI or Home Care Consult  Complete Complete  Social Work Consult for Recovery Care Planning/Counseling  Complete   Palliative Care Screening  Not Applicable Not Applicable  Medication Review Oceanographer)  Complete Complete

## 2023-01-21 NOTE — ED Provider Notes (Signed)
[place] EMERGENCY DEPARTMENT AT [facility] Provider Note  CSN: [id number] Arrival date & time: 01/21/23 0113  Chief Complaint(s) Shortness of Breath  HPI Karla Hoover is a 52 y.o. female with a past medical history listed below including diabetes, COPD, prior PE last DVT on Eliquis, prior stroke with left-sided deficits who lives in a skilled nursing facility who presents to the emergency department with several hours of subjective fevers, chills, myalgias and shortness of breath.  Patient believes she is becoming "septic," stating that this is the way she felt the last time she was here when she had urosepsis.  She denies any nausea or vomiting.  No coughing or congestion. No chest pain. Endorses dysuria and bilateral flank pain.  The history is provided by the patient.    Past Medical History Past Medical History:  Diagnosis Date   Asthma    Bilateral pulmonary embolism 11/15/2019   Chronic kidney disease, stage 3a 02/09/2021   Chronic respiratory failure with hypoxia 02/09/2021   COPD (chronic obstructive pulmonary disease)    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   Former smoker 36 pack year smoking history     Diabetes mellitus without complication    Essential hypertension 05/04/2009   Qualifier: Diagnosis of  By: Roxan Hockey CMA, Jessica     GERD without esophagitis 02/09/2021   Grade I diastolic dysfunction 12/02/2021   Hypertension    Hypothyroidism 02/09/2021   Mixed 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 02/09/2021   Pulmonary embolism 11/16/2019   Stroke    Type 2 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 disease, with long-term current use of insulin 02/09/2021   Patient Active Problem List   Diagnosis Date Noted   UTI (urinary tract infection) 01/21/2023   Class 1 obesity due to excess calories with body mass index (BMI) of 32.0 to 32.9 in adult 12/18/2022   Dyslipidemia 12/18/2022   Sepsis due to urinary tract  infection 12/13/2022   Hyponatremia 12/13/2022   CKD (chronic kidney disease) stage 3, GFR 30-59 ml/min 12/13/2022   Sepsis 09/07/2022   AKI (acute kidney injury) 08/30/2022   History of pulmonary embolism 08/30/2022   Hemiparesis affecting left side as late effect of cerebrovascular accident (CVA) 08/30/2022   Allergies    Grade I diastolic dysfunction 12/02/2021   Mild protein-calorie malnutrition 12/02/2021   COPD with acute exacerbation 12/02/2021   Acute renal failure superimposed on stage 3a chronic kidney disease 02/09/2021   Chronic respiratory failure with hypoxia 02/09/2021   History of thromboembolism 02/09/2021   Type 2 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 disease, with long-term current use of insulin 02/09/2021   Mixed 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 02/09/2021   GERD without esophagitis 02/09/2021   Hypothyroidism 02/09/2021   Lymphadenopathy 12/28/2020   hx of PE/DVT 12/01/2019   Overweight (BMI 25.0-29.9) 11/22/2019   Tobacco abuse 11/16/2019   DM (diabetes mellitus), type 2 11/15/2019   History of stroke 11/15/2019   Essential hypertension 05/04/2009   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 Authorizing Provider  acetaminophen (TYLENOL) 500 MG tablet Take 1,000 mg by mouth in the morning, at noon, and at bedtime.    [provider]  acidophilus (RISAQUAD) CAPS capsule Take 1 capsule by mouth daily. 12/19/22   Jonah Blue, MD  albuterol (VENTOLIN HFA) 108 (90 Base) MCG/ACT inhaler Inhale 2 puffs into the  lungs every 6 (six) hours as needed for wheezing or shortness of breath. 10/04/19   Grayce Sessions, NP  apixaban Everlene Balls) 5 MG TABS tablet Take 1 tablet (5mg ) twice daily 09/10/22   Jerald Kief, MD  aspirin 81 MG chewable tablet Chew 81 mg by mouth in the morning.    [provider]   atorvastatin (LIPITOR) 80 MG tablet Take 1 tablet (80 mg total) by mouth daily. Patient taking differently: Take 80 mg by mouth at bedtime. 10/04/19   Grayce Sessions, NP  B Complex-Biotin-FA TABS Take 1 tablet by mouth in the morning.    [provider]  chlorhexidine (PERIDEX) 0.12 % solution Use as directed 15 mLs in the mouth or throat every morning. Rinse and spit after oral care. Do not swallow.    [provider]  Cranberry 250 MG TABS Take 250 mg by mouth at bedtime.    [provider]  cyclobenzaprine (FLEXERIL) 10 MG tablet Take 1 tablet (10 mg total) by mouth 3 (three) times daily as needed for muscle spasms. Patient taking differently: Take 10 mg by mouth every 8 (eight) hours as needed for muscle spasms. 10/04/19   Grayce Sessions, NP  Dulaglutide (TRULICITY) 0.75 MG/0.5ML SOPN Inject 0.75 mg into the skin every Friday.    [provider]  EPINEPHrinesnap 1 MG/ML KIT Inject 1 mg as directed as needed (allergic reaction).    [provider]  ergocalciferol (VITAMIN D2) 1.25 MG (50000 UT) capsule Take 50,000 Units by mouth every Thursday.    [provider]  ezetimibe (ZETIA) 10 MG tablet Take 10 mg by mouth at bedtime.    [provider]  gabapentin (NEURONTIN) 400 MG capsule Take 1 capsule (400 mg total) by mouth 3 (three) times daily. 09/10/22   Jerald Kief, MD  insulin glargine (LANTUS) 100 UNIT/ML injection Inject 0.1 mLs (10 Units total) into the skin 2 (two) times daily. (discard vial 28 days after first use) 12/18/22   Jonah Blue, MD  insulin lispro (HUMALOG) 100 UNIT/ML injection Inject 0-14 Units into the skin See admin instructions. Inject 0-14 units twice daily as per sliding scale : CBG 0-200 : 0 units CBG 201-250 : 2 units CBG 251-300 : 4 units CBG 301-350 : 6 units CBG 351-400 : 8 units CBG 401-450 : 10 units CBG 451-500 : 12 units (if BP reads high, inject 14 units)    [provider]  ipratropium-albuterol (DUONEB) 0.5-2.5 (3) MG/3ML SOLN Inhale 1 vial via nebulizer 3 (three) times daily. Patient taking differently: Take 3 mLs by nebulization See admin instructions. 2 entries on MAR :  1 vial via nebulizer three times daily + 1 vial every 8 hours as needed for shortness of breath, wheezing. 12/03/21   Swayze, Ava, DO  lansoprazole (PREVACID) 30 MG capsule Take 30 mg by mouth every morning.    [provider]  levothyroxine (SYNTHROID) 75 MCG tablet Take 75 mcg by mouth in the morning. (0000)    [provider]  magnesium oxide (MAG-OX) 400 (240 Mg) MG tablet Take 400 mg by mouth daily.    [provider]  Melatonin 5 MG TBDP Take 5 mg by mouth at bedtime.    [provider]  Menthol, Topical Analgesic, 5 % GEL Apply 1 application  topically in the morning. To neck    [provider]  metoprolol succinate (TOPROL-XL) 50 MG 24 hr tablet Take 50 mg by mouth in the  morning.    [provider]  Omega-3 Fatty Acids (FISH OIL) 1000 MG CAPS Take 1,000 mg by mouth 3 (three) times daily.    [provider]  OXYGEN Inhale 2 L/min into the lungs as needed (for shortness of breath).    [provider]  Propyl Glycol-Hydroxyethylcell (NASAL MOIST) GEL Place 1 application  into both nostrils at bedtime.    [provider]  Skin Protectants, Misc. (EUCERIN) cream Apply 1 Application topically See admin instructions. Apply topically once daily and as needed for dry skin.    [provider]  SYMBICORT 80-4.5 MCG/ACT inhaler Inhale 2 puffs into the lungs 2 (two) times daily.    [provider]  Tiotropium Bromide Monohydrate (SPIRIVA RESPIMAT) 2.5 MCG/ACT AERS Inhale 2.5 mcg into the lungs in the morning.    [provider]                                                                                                                                    Allergies Cipro [ciprofloxacin hcl],  Guaifenesin, Kiwi extract, Levaquin [levofloxacin], Strawberry extract, and Lioresal [baclofen]  Review of Systems Review of Systems As noted in HPI  Physical Exam Vital Signs  I have reviewed the triage vital signs BP (!) 156/87   Pulse (!) 125   Temp 99.1 F (37.3 C)   Resp 18   Ht 5\' 6"  (1.676 m)   Wt 97.5 kg   LMP 10/20/2019   SpO2 94%   BMI 34.70 kg/m   Physical Exam Vitals reviewed.  Constitutional:      General: She is not in acute distress.    Appearance: She is well-developed. She is not diaphoretic.  HENT:     Head: Normocephalic and atraumatic.     Nose: Nose normal.  Eyes:     General: No scleral icterus.       Right eye: No discharge.        Left eye: No discharge.     Conjunctiva/sclera: Conjunctivae normal.     Pupils: Pupils are equal, round, and reactive to light.  Cardiovascular:     Rate and Rhythm: Regular rhythm. Tachycardia present.     Heart sounds: No murmur heard.    No friction rub. No gallop.  Pulmonary:     Effort: Pulmonary effort is normal. No respiratory distress.     Breath sounds: Normal breath sounds. No stridor. No rales.  Abdominal:     General: There is no distension.     Palpations: Abdomen is soft.     Tenderness: There is abdominal tenderness (mild) in the suprapubic area. There is right CVA tenderness and left CVA tenderness.  Musculoskeletal:        General: No tenderness.     Cervical back: Normal range of motion and neck supple.  Skin:    General: Skin is warm and dry.     Findings: No  erythema or rash.  Neurological:     Mental Status: She is alert and oriented to person, place, and time.     ED Results and Treatments Labs (all labs ordered are listed, but only abnormal results are displayed) Labs Reviewed  LACTIC ACID, PLASMA - Abnormal; Notable for the following components:      Result Value   Lactic Acid, Venous 2.8 (*)    All other components within normal limits  COMPREHENSIVE METABOLIC PANEL -  Abnormal; Notable for the following components:   Sodium 133 (*)    Chloride 97 (*)    Glucose, Bld 339 (*)    Creatinine, Ser 1.15 (*)    Albumin 3.4 (*)    AST 42 (*)    GFR, Estimated 58 (*)    All other components within normal limits  CBC WITH DIFFERENTIAL/PLATELET - Abnormal; Notable for the following components:   WBC 13.1 (*)    Neutro Abs 10.4 (*)    All other components within normal limits  PROTIME-INR - Abnormal; Notable for the following components:   Prothrombin Time 16.3 (*)    INR 1.3 (*)    All other components within normal limits  URINALYSIS, W/ REFLEX TO CULTURE (INFECTION SUSPECTED) - Abnormal; Notable for the following components:   APPearance HAZY (*)    Glucose, UA 150 (*)    Hgb urine dipstick SMALL (*)    Protein, ur 100 (*)    Leukocytes,Ua LARGE (*)    Bacteria, UA MANY (*)    All other components within normal limits  BLOOD GAS, VENOUS - Abnormal; Notable for the following components:   pO2, Ven 56 (*)    Bicarbonate 29.1 (*)    Acid-Base Excess 3.3 (*)    All other components within normal limits  CBG MONITORING, ED - Abnormal; Notable for the following components:   Glucose-Capillary 326 (*)    All other components within normal limits  CULTURE, BLOOD (ROUTINE X 2)  CULTURE, BLOOD (ROUTINE X 2)  URINE CULTURE  LACTIC ACID, PLASMA  APTT  BETA-HYDROXYBUTYRIC ACID                                                                                                                         EKG  EKG Interpretation  Date/Time:  Tuesday January 21 2023 01:22:28 EDT Ventricular Rate:  119 PR Interval:  154 QRS Duration: 91 QT Interval:  322 QTC Calculation: 453 R Axis:   71 Text Interpretation: Sinus tachycardia Low voltage, precordial leads No significant change was found Confirmed by Drema Pry [phone]) on 01/21/2023 1:27:28 AM       Radiology DG Chest Port 1 View  Result Date: 01/21/2023 CLINICAL DATA:  Fever, shortness of breath EXAM:  PORTABLE CHEST 1 VIEW COMPARISON:  12/13/2022 FINDINGS: Mild left lower lobe opacity, atelectasis versus pneumonia. Right lung is clear. No pleural effusion or pneumothorax. The heart is normal in size. IMPRESSION: Mild left lower lobe opacity, atelectasis versus pneumonia. Electronically  Signed   By: Charline Bills M.D.   On: 01/21/2023 02:42    Medications Ordered in ED Medications  sodium chloride 0.9 % bolus 1,000 mL (0 mLs Intravenous Stopped 01/21/23 0346)    Followed by  0.9 %  sodium chloride infusion (1,000 mLs Intravenous New Bag/Given 01/21/23 0301)  metoprolol succinate (TOPROL-XL) 24 hr tablet 50 mg (50 mg Oral Given 01/21/23 0629)  ciprofloxacin (CIPRO) IVPB 400 mg (0 mg Intravenous Stopped 01/21/23 0610)  acetaminophen (TYLENOL) tablet 1,000 mg (1,000 mg Oral Given 01/21/23 0411)  diphenhydrAMINE (BENADRYL) injection 25 mg (25 mg Intravenous Given 01/21/23 0615)                                                                                                                                     Procedures .Critical Care  Performed by: Nira Conn, MD Authorized by: Nira Conn, MD   Critical care provider statement:    Critical care time (minutes):  45   Critical care time was exclusive of:  Separately billable procedures and treating other patients   Critical care was necessary to treat or prevent imminent or life-threatening deterioration of the following conditions:  Sepsis   Critical care was time spent personally by me on the following activities:  Development of treatment plan with patient or surrogate, discussions with consultants, evaluation of patient's response to treatment, examination of patient, obtaining history from patient or surrogate, review of old charts, re-evaluation of patient's condition, pulse oximetry, ordering and review of radiographic studies, ordering and review of laboratory studies and ordering and performing treatments and  interventions   Care discussed with: admitting provider     (including critical care time)  Medical Decision Making / ED Course  Click here for ABCD2, HEART and other calculators  Medical Decision Making Amount and/or Complexity of Data Reviewed Labs: ordered. Decision-making details documented in ED Course. Radiology: ordered and independent interpretation performed. Decision-making details documented in ED Course. ECG/medicine tests: ordered and independent interpretation performed. Decision-making details documented in ED Course.  Risk OTC drugs. Prescription drug management. Decision regarding hospitalization.    This patient presents to the ED for concern of myalgia, fever, sob, urinary sx, this involves an extensive number of treatment options, and is a complaint that carries with it a high risk of complications and morbidity. The differential diagnosis includes but not limited to urinary infection, viral infection, intra-abdeminial infection, DKA, metabolic/electrolyte derangements.  Initial intervention:  IVF  Work up: (Labs and/or imaging independently interpreted by me) CBC with leukocytosis.  No anemia. Metabolic panel with without significant electrolyte derangements.  Mild renal insufficiency without evidence of AKI.  Hyperglycemia without evidence of DKA. UA is consistent with a urinary tract infection Lactic acid normal Chest x-ray with minimal left streaky opacity favored to be atelectasis more so than pneumonia given her lack of cough.  Reassessment: Patient started on IV Cipro since the previous cultures  grew out E. coli susceptible only to Cipro and Zosyn.  Patient is allergic to Levaquin but tolerated ciprofloxacin recently. Patient reported worsening rigors and repeat temperature up trended, now 101.5.  Patient had already been given Tylenol.  Spoke with Dr. Margo Aye from the hospitalist service who agreed to admit patient for further management. After admission,  I was called into the room this patient had developed a rash after receiving ciprofloxacin.  She was given Benadryl.  Hospitalist informed of the reaction.  Repeat lactic acid up to 2.8.    Final Clinical Impression(s) / ED Diagnoses Final diagnoses:  Pyelonephritis           This chart was dictated using voice recognition software.  Despite best efforts to proofread,  errors can occur which can change the documentation meaning.    Nira Conn, MD 01/21/23 [id number]

## 2023-01-21 NOTE — ED Notes (Signed)
Pt stated her arm was burning and itching, when I observed her Rt arm I noticed blotchy red skin, cipro stopped and provider notified.

## 2023-01-21 NOTE — ED Notes (Signed)
ED TO INPATIENT HANDOFF REPORT  Name/Age/Gender Karla Hoover 52 y.o. female  Code Status Code Status History     Date Active Date Inactive Code Status Order ID Comments User Context   12/13/2022 1632 12/18/2022 2013 Full Code [id number]  Orland Mustard, MD ED   09/07/2022 0605 09/10/2022 1726 Full Code [id number]  Gery Pray, MD ED   08/30/2022 0226 08/30/2022 1709 Full Code [id number]  Carollee Herter, DO Inpatient   08/30/2022 0135 08/30/2022 0226 Full Code [id number]  Carollee Herter, DO ED   01/02/2022 0425 01/06/2022 1849 Full Code [id number]  Eduard Clos, MD ED   12/02/2021 0909 12/03/2021 2107 Full Code [id number]  Bobette Mo, MD ED   05/29/2021 1626 05/31/2021 2358 Full Code [id number]  Darnelle Catalan, MD ED   02/09/2021 0125 02/13/2021 0217 Full Code [id number]  Shalhoub, Deno Lunger, MD Inpatient   11/15/2019 2346 11/23/2019 2018 Full Code [id number]  Therisa Doyne, MD ED    Questions for Most Recent Historical Code Status (Order [id number])     Question Answer   By: Consent: discussion documented in EHR            Home/SNF/Other Skilled nursing facility  Chief Complaint UTI (urinary tract infection) [N39.0]  Level of Care/Admitting Diagnosis ED Disposition     ED Disposition  Admit   Condition  --   Comment  Hospital Area: [facility] [[id number]]  Level of Care: Telemetry [5]  Admit to tele based on following criteria: Monitor for Ischemic changes  May admit patient to [facility] or [facility] if equivalent level of care is available:: Yes  Covid Evaluation: Asymptomatic - no recent exposure (last 10 days) testing not required  Diagnosis: UTI (urinary tract infection) [301601]  Admitting Physician: Darlin Drop [[id number]]  Attending Physician: Darlin Drop [[id number]]  Certification:: I certify this patient will need inpatient services for at least 2 midnights  Estimated Length of Stay: 2          Medical History Past Medical  History:  Diagnosis Date   Asthma    Bilateral pulmonary embolism 11/15/2019   Chronic kidney disease, stage 3a 02/09/2021   Chronic respiratory failure with hypoxia 02/09/2021   COPD (chronic obstructive pulmonary disease)    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   Former smoker 36 pack year smoking history     Diabetes mellitus without complication    Essential hypertension 05/04/2009   Qualifier: Diagnosis of  By: Roxan Hockey CMA, Jessica     GERD without esophagitis 02/09/2021   Grade I diastolic dysfunction 12/02/2021   Hypertension    Hypothyroidism 02/09/2021   Mixed 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 02/09/2021   Pulmonary embolism 11/16/2019   Stroke    Type 2 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 disease, with long-term current use of insulin 02/09/2021    Allergies Allergies  Allergen Reactions   Cipro [Ciprofloxacin Hcl] Hives   Guaifenesin Anaphylaxis   Kiwi Extract Anaphylaxis and Rash   Levaquin [Levofloxacin] Shortness Of Breath and Rash   Strawberry Extract Anaphylaxis and Rash   Lioresal [Baclofen] Other (See Comments)    Near syncope/ fall    IV Location/Drains/Wounds Patient Lines/Drains/Airways Status     Active Line/Drains/Airways     Name Placement date Placement time Site Days   Peripheral IV 01/21/23 18 G Anterior;Right Forearm 01/21/23  0250  Forearm  less than 1            Labs/Imaging  Results for orders placed or performed during the hospital encounter of 01/21/23 (from the past 48 hour(s))  CBG monitoring, ED     Status: Abnormal   Collection Time: 01/21/23  1:23 AM  Result Value Ref Range   Glucose-Capillary 326 (H) 70 - 99 mg/dL    Comment: Glucose reference range applies only to samples taken after fasting for at least 8 hours.  Lactic acid, plasma     Status: None   Collection Time: 01/21/23  2:30 AM  Result Value Ref Range   Lactic Acid, Venous 1.9 0.5 - 1.9 mmol/L    Comment: Performed  at [facility], [address], [place], [place] [address]  Comprehensive metabolic panel     Status: Abnormal   Collection Time: 01/21/23  2:30 AM  Result Value Ref Range   Sodium 133 (L) 135 - 145 mmol/L   Potassium 4.8 3.5 - 5.1 mmol/L   Chloride 97 (L) 98 - 111 mmol/L   CO2 25 22 - 32 mmol/L   Glucose, Bld 339 (H) 70 - 99 mg/dL    Comment: Glucose reference range applies only to samples taken after fasting for at least 8 hours.   BUN 15 6 - 20 mg/dL   Creatinine, Ser 6.04 (H) 0.44 - 1.00 mg/dL   Calcium 9.3 8.9 - 54.0 mg/dL   Total Protein 7.2 6.5 - 8.1 g/dL   Albumin 3.4 (L) 3.5 - 5.0 g/dL   AST 42 (H) 15 - 41 U/L   ALT 25 0 - 44 U/L   Alkaline Phosphatase 81 38 - 126 U/L   Total Bilirubin 0.7 0.3 - 1.2 mg/dL   GFR, Estimated 58 (L) >60 mL/min    Comment: (NOTE) Calculated using the CKD-EPI Creatinine Equation (2021)    Anion gap 11 5 - 15    Comment: Performed at [facility], [address], [place], [place] [address]  CBC with Differential     Status: Abnormal   Collection Time: 01/21/23  2:30 AM  Result Value Ref Range   WBC 13.1 (H) 4.0 - 10.5 K/uL   RBC 4.54 3.87 - 5.11 MIL/uL   Hemoglobin 12.1 12.0 - 15.0 g/dL   HCT 14.7 82.9 - 56.2 %   MCV 83.5 80.0 - 100.0 fL   MCH 26.7 26.0 - 34.0 pg   MCHC 31.9 30.0 - 36.0 g/dL   RDW 13.0 86.5 - 78.4 %   Platelets 196 150 - 400 K/uL   nRBC 0.0 0.0 - 0.2 %   Neutrophils Relative % 79 %   Neutro Abs 10.4 (H) 1.7 - 7.7 K/uL   Lymphocytes Relative 15 %   Lymphs Abs 1.9 0.7 - 4.0 K/uL   Monocytes Relative 5 %   Monocytes Absolute 0.6 0.1 - 1.0 K/uL   Eosinophils Relative 1 %   Eosinophils Absolute 0.1 0.0 - 0.5 K/uL   Basophils Relative 0 %   Basophils Absolute 0.1 0.0 - 0.1 K/uL   Immature Granulocytes 0 %   Abs Immature Granulocytes 0.03 0.00 - 0.07 K/uL    Comment: Performed at [facility], [address] [place], [place] [address]  Protime-INR     Status:  Abnormal   Collection Time: 01/21/23  2:30 AM  Result Value Ref Range   Prothrombin Time 16.3 (H) 11.4 - 15.2 seconds   INR 1.3 (H) 0.8 - 1.2    Comment: (NOTE) INR goal varies based on device and disease states. Performed at  [facility], [address], [place], [place] [address]   APTT     Status: None   Collection Time: 01/21/23  2:30 AM  Result Value Ref Range   aPTT 30 24 - 36 seconds    Comment: Performed at [facility], [address], [place], [place] [address]  Urinalysis, w/ Reflex to Culture (Infection Suspected) -Urine, Clean Catch     Status: Abnormal   Collection Time: 01/21/23  2:30 AM  Result Value Ref Range   Specimen Source URINE, CLEAN CATCH    Color, Urine YELLOW YELLOW   APPearance HAZY (A) CLEAR   Specific Gravity, Urine 1.013 1.005 - 1.030   pH 5.0 5.0 - 8.0   Glucose, UA 150 (A) NEGATIVE mg/dL   Hgb urine dipstick SMALL (A) NEGATIVE   Bilirubin Urine NEGATIVE NEGATIVE   Ketones, ur NEGATIVE NEGATIVE mg/dL   Protein, ur 119 (A) NEGATIVE mg/dL   Nitrite NEGATIVE NEGATIVE   Leukocytes,Ua LARGE (A) NEGATIVE   RBC / HPF 0-5 0 - 5 RBC/hpf   WBC, UA >50 0 - 5 WBC/hpf    Comment:        Reflex urine culture not performed if WBC <=10, OR if Squamous epithelial cells >5. If Squamous epithelial cells >5 suggest recollection.    Bacteria, UA MANY (A) NONE SEEN   Squamous Epithelial / HPF 0-5 0 - 5 /HPF    Comment: Performed at [facility], [address], [place], [place] [address]  Beta-hydroxybutyric acid     Status: None   Collection Time: 01/21/23  2:30 AM  Result Value Ref Range   Beta-Hydroxybutyric Acid 0.21 0.05 - 0.27 mmol/L    Comment: Performed at [facility], [address] [place], [place] [address]  Blood gas, venous (at [facility])     Status: Abnormal   Collection Time: 01/21/23  2:45 AM  Result Value Ref Range   pH, Ven 7.4 7.25 - 7.43   pCO2, Ven 46 44 - 60  mmHg   pO2, Ven 56 (H) 32 - 45 mmHg   Bicarbonate 29.1 (H) 20.0 - 28.0 mmol/L   Acid-Base Excess 3.3 (H) 0.0 - 2.0 mmol/L   O2 Saturation 92 %   Patient temperature 36.1     Comment: Performed at [facility], [address], [place], [place] [address]  Lactic acid, plasma     Status: Abnormal   Collection Time: 01/21/23  6:01 AM  Result Value Ref Range   Lactic Acid, Venous 2.8 (HH) 0.5 - 1.9 mmol/L    Comment: CRITICAL RESULT CALLED TO, READ BACK BY AND VERIFIED WITH KISER,C. RN AT [phone] 01/21/23 MULLINS,T Performed at [facility], [address], [place], [place] [address]    DG Chest Port 1 View  Result Date: 01/21/2023 CLINICAL DATA:  Fever, shortness of breath EXAM: PORTABLE CHEST 1 VIEW COMPARISON:  12/13/2022 FINDINGS: Mild left lower lobe opacity, atelectasis versus pneumonia. Right lung is clear. No pleural effusion or pneumothorax. The heart is normal in size. IMPRESSION: Mild left lower lobe opacity, atelectasis versus pneumonia. Electronically Signed   By: Charline Bills M.D.   On: 01/21/2023 02:42    Pending Labs Unresulted Labs (From admission, onward)     Start     Ordered   01/21/23 0230  Urine Culture  Once,   R        01/21/23 0230   01/21/23 0154  Blood Culture (  routine x 2)  (Undifferentiated presentation (screening labs and basic nursing orders))  BLOOD CULTURE X 2,   STAT      01/21/23 0154            Vitals/Pain Today's Vitals   01/21/23 0530 01/21/23 0630 01/21/23 0657 01/21/23 0700  BP: (!) 155/126 (!) 151/100  (!) 156/87  Pulse: (!) 105 (!) 130  (!) 125  Resp:  19  18  Temp:    99.1 F (37.3 C)  TempSrc:      SpO2: (!) 70% 96%  94%  Weight:      Height:      PainSc:   4      Isolation Precautions No active isolations  Medications Medications  sodium chloride 0.9 % bolus 1,000 mL (0 mLs Intravenous Stopped 01/21/23 0346)    Followed by  0.9 %  sodium chloride infusion (1,000 mLs Intravenous New  Bag/Given 01/21/23 0301)  metoprolol succinate (TOPROL-XL) 24 hr tablet 50 mg (50 mg Oral Given 01/21/23 0629)  ciprofloxacin (CIPRO) IVPB 400 mg (0 mg Intravenous Stopped 01/21/23 0610)  acetaminophen (TYLENOL) tablet 1,000 mg (1,000 mg Oral Given 01/21/23 0411)  diphenhydrAMINE (BENADRYL) injection 25 mg (25 mg Intravenous Given 01/21/23 0615)    Mobility Unknown by this RN.

## 2023-01-21 NOTE — ED Notes (Signed)
Pt reports that she urinated in brief. Brief changed and peri care provided.

## 2023-01-21 NOTE — ED Triage Notes (Signed)
BIBA from [facility] for [facility] and fever stated it started around 1800, also has generalized pain,  .  HR 121 135/72 CBG 228

## 2023-01-21 NOTE — H&P (Signed)
History and Physical  BRE PECINA UEA:[id number] DOB: 13-Nov-1970 DOA: 01/21/2023  PCP: Pcp, No   Chief Complaint: shaking chills   HPI: Karla Hoover is a 52 y.o. female with medical history significant for COPD on nocturnal oxygen, CKD stage IIIa, insulin-dependent type 2 diabetes, hypertension, GERD, history of DVT/PE on Eliquis, grade 1 diastolic dysfunction and history of CVA with left-sided deficits as well as ESBL UTI presenting to the hospital with fevers and chills, found to have recurrent sepsis due to UTI.  Patient states she has been in rehab, in her usual state of health, until she suddenly started having shaking chills and fever about 6 PM last night.  She recognized this as a likely sign of recurrent UTI and sepsis.  Nursing staff at the rehab facility also noticed that she was tachycardic, per the patient.  She was brought in by ambulance from [facility] for evaluation.  ED Course: Evaluation in the ER revealed fever 101.5, tachycardia as high as 130.  Blood pressure stable.  Lab work revealed leukocytosis 13,000, stable renal function and lactate in the normal range.  She was given a bolus of IV fluid, given empiric IV Levaquin based on prior ESBL E. coli cultures.  Currently: Patient is seen resting comfortably in her room on the fifth floor.  She has no complaints, is comfortable.  Denies any current flank pain, nausea, fevers, or chills.  Review of Systems: Please see HPI for pertinent positives and negatives. A complete 10 system review of systems are otherwise negative.  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 No pertinent surgical history.  Social History:  reports that she quit smoking about 3 years ago. Her smoking use included cigarettes. She started smoking about 39 years ago. She has a 18.00 pack-year smoking history. She has never used smokeless tobacco. She reports that she does not drink alcohol and does not use drugs.   Allergies  Allergen Reactions   Cipro [Ciprofloxacin Hcl] Hives   Guaifenesin Anaphylaxis   Kiwi Extract Anaphylaxis and Rash   Levaquin [Levofloxacin] Shortness Of Breath and Rash   Strawberry Extract Anaphylaxis and Rash   Lioresal [Baclofen] Other (See Comments)    Near syncope/ fall    Family History  Problem Relation Age of Onset   Diabetes Mother    COPD Sister    Heart failure Sister      Prior to Admission medications   Medication Sig Start Date End Date Taking? Authorizing Provider  acetaminophen (TYLENOL) 500 MG tablet Take 1,000 mg by mouth in the morning, at noon, and at bedtime.    [provider]  acidophilus (RISAQUAD) CAPS capsule Take 1 capsule  by mouth daily. 12/19/22   Jonah Blue, MD  albuterol (VENTOLIN HFA) 108 (90 Base) MCG/ACT inhaler Inhale 2 puffs into the lungs every 6 (six) hours as needed for wheezing or shortness of breath. 10/04/19   Grayce Sessions, NP  apixaban Everlene Balls) 5 MG TABS tablet Take 1 tablet ( ) twice daily 09/10/22   Jerald Kief, MD  aspirin 81 MG chewable tablet Chew 81 mg by mouth in the morning.    [provider]   atorvastatin (LIPITOR) 80 MG tablet Take 1 tablet (80 mg total) by mouth daily. Patient taking differently: Take 80 mg by mouth at bedtime. 10/04/19   Grayce Sessions, NP  B Complex-Biotin-FA TABS Take 1 tablet by mouth in the morning.    [provider]  chlorhexidine (PERIDEX) 0.12 % solution Use as directed 15 mLs in the mouth or throat every morning. Rinse and spit after oral care. Do not swallow.    [provider]  Cranberry 250 MG TABS Take 250 mg by mouth at bedtime.    [provider]  cyclobenzaprine (FLEXERIL) 10 MG tablet Take 1 tablet (10 mg total) by mouth 3 (three) times daily as needed for muscle spasms. Patient taking differently: Take 10 mg by mouth every 8 (eight) hours as needed for muscle spasms. 10/04/19   Grayce Sessions, NP  Dulaglutide (TRULICITY) 0.75 MG/0.5ML SOPN Inject 0.75 mg into the skin every Friday.    [provider]  EPINEPHrinesnap 1 MG/ML KIT Inject 1 mg as directed as needed (allergic reaction).    [provider]  ergocalciferol (VITAMIN D2) 1.25 MG (50000 UT) capsule Take 50,000 Units by mouth every Thursday.    [provider]  ezetimibe (ZETIA) 10 MG tablet Take 10 mg by mouth at bedtime.    [provider]  gabapentin (NEURONTIN) 400 MG capsule Take 1 capsule (400 mg total) by mouth 3 (three) times daily. 09/10/22   Jerald Kief, MD  insulin glargine (LANTUS) 100 UNIT/ML injection Inject 0.1 mLs (10 Units total) into the skin 2 (two) times daily. (discard vial 28 days after first use) 12/18/22   Jonah Blue, MD  insulin lispro (HUMALOG) 100 UNIT/ML injection Inject 0-14 Units into the skin See admin instructions. Inject 0-14 units twice daily as per sliding scale : CBG 0-200 : 0 units CBG 201-250 : 2 units CBG 251-300 : 4 units CBG 301-350 : 6 units CBG 351-400 : 8 units CBG 401-450 : 10 units CBG 451-500 : 12 units (if BP reads high, inject 14 units)    [provider]  ipratropium-albuterol (DUONEB) 0.5-2.5 (3) MG/3ML SOLN Inhale 1 vial via nebulizer 3 (three) times daily. Patient taking differently: Take 3 mLs by nebulization See admin instructions. 2 entries on MAR :  1 vial via nebulizer three times daily + 1 vial every 8 hours as needed for shortness of breath, wheezing. 12/03/21   Swayze, Ava, DO  lansoprazole (PREVACID) 30 MG capsule Take 30 mg by mouth every morning.    [provider]  levothyroxine (SYNTHROID) 75 MCG tablet Take 75 mcg by mouth in the morning. (0000)    [provider]  magnesium oxide (MAG-OX) 400 (240 Mg) MG tablet Take 400 mg by mouth daily.    [provider]  Melatonin 5 MG TBDP Take 5 mg by mouth at bedtime.    [provider]  Menthol, Topical Analgesic, 5 % GEL Apply 1 application  topically in the morning. To  neck    [provider]  metoprolol succinate (TOPROL-XL) 50 MG 24 hr tablet Take 50 mg by mouth in the morning.    [provider]  Omega-3 Fatty Acids (FISH OIL) 1000 MG CAPS Take 1,000 mg by mouth 3 (three) times daily.    [provider]  OXYGEN Inhale 2 L/min into the lungs as needed (for shortness of breath).    [provider]  Propyl Glycol-Hydroxyethylcell (NASAL MOIST) GEL Place 1 application  into both nostrils at bedtime.    [provider]  Skin Protectants, Misc. (EUCERIN) cream Apply 1 Application topically See admin instructions. Apply topically once daily and as needed for dry skin.    [provider]  SYMBICORT 80-4.5 MCG/ACT inhaler Inhale 2 puffs into the lungs 2 (two) times daily.    [provider]  Tiotropium Bromide Monohydrate (SPIRIVA RESPIMAT) 2.5 MCG/ACT AERS Inhale 2.5 mcg into the lungs in the morning.    [provider]    Physical Exam: BP 125/72 (BP Location: Right Arm)   Pulse (!) 102   Temp 99.4 F (37.4 C)   Resp 18   Ht 5\' 6"  (1.676 m)   Wt 97.5 kg   LMP 10/20/2019    SpO2 97%   BMI 34.70 kg/m   General:  Alert, oriented, calm, in no acute distress  Eyes: EOMI, clear conjuctivae, white sclerea Neck: supple, no masses, trachea mildline  Cardiovascular: RRR, no murmurs or rubs, no peripheral edema  Respiratory: clear to auscultation bilaterally, no wheezes, no crackles  Abdomen: soft, nontender, nondistended, normal bowel tones heard  Skin: dry, no rashes  Musculoskeletal: no joint effusions, normal range of motion  Psychiatric: appropriate affect, normal speech  Neurologic: extraocular muscles intact, clear speech, left-sided deficit         Labs on Admission:  Basic Metabolic Panel: Recent Labs  Lab 01/21/23 0230  NA 133*  K 4.8  CL 97*  CO2 25  GLUCOSE 339*  BUN 15  CREATININE 1.15*  CALCIUM 9.3   Liver Function Tests: Recent Labs  Lab 01/21/23 0230  AST 42*  ALT 25  ALKPHOS 81  BILITOT 0.7  PROT 7.2  ALBUMIN 3.4*   No results for input(s): "LIPASE", "AMYLASE" in the last 168 hours. No results for input(s): "AMMONIA" in the last 168 hours. CBC: Recent Labs  Lab 01/21/23 0230  WBC 13.1*  NEUTROABS 10.4*  HGB 12.1  HCT 37.9  MCV 83.5  PLT 196   Cardiac Enzymes: No results for input(s): "CKTOTAL", "CKMB", "CKMBINDEX", "TROPONINI" in the last 168 hours.  BNP (last 3 results) Recent Labs    12/13/22 1701  BNP 74.9    ProBNP (last 3 results) No results for input(s): "PROBNP" in the last 8760 hours.  CBG: Recent Labs  Lab 01/21/23 0123 01/21/23 0820  GLUCAP 326* 270*    Radiological Exams on Admission: DG Chest Port 1 View  Result Date: 01/21/2023 CLINICAL DATA:  Fever, shortness of breath EXAM: PORTABLE CHEST 1 VIEW COMPARISON:  12/13/2022 FINDINGS: Mild left lower lobe opacity, atelectasis versus pneumonia. Right lung is clear. No pleural effusion or pneumothorax. The heart is normal in size. IMPRESSION: Mild left lower lobe opacity, atelectasis versus pneumonia. Electronically Signed   By: Charline Bills M.D.   On: 01/21/2023 02:42    Assessment/Plan  Severe sepsis secondary to UTI-meeting criteria with tachycardia, leukocytosis, fever, lactate 2.8.  Patient is still tachycardic and appears dry on exam, I do not think she  is adequately fluid resuscitated. -Inpatient admission to telemetry -Bolus 1 L normal saline now, then continue sodium chloride infusion at 125 an hour for the next 10 hours; caution due to history of diastolic dysfunction -Lactic acid is trending upwards, will recheck in 4 hours after fluid bolus -Continue empiric ciprofloxacin -Follow blood and urine cultures obtained in the ER on 4/15, tailor antibiotic therapy as appropriate  Type 2 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 disease, with long-term current use of insulin -Carb controlled diet -Basal bolus insulin dosing, may need to adjust Lantus dose once med list reconciled -Moderate sliding scale insulin  CKD stage IIIa-renal function at baseline, monitor with daily labs while hospitalized   COPD with chronic bronchitis (HCC)-currently on room air, with no evidence of exacerbation   Essential hypertension   Hemiparesis affecting left side as late effect of cerebrovascular accident (CVA)   hx of PE/DVT-continue Eliquis   Grade I diastolic dysfunction   GERD without esophagitis   Hypothyroidism   Dyslipidemia  DVT prophylaxis: Eliquis    Code Status: Full Code  Consults called: None  Admission status: The appropriate patient status for this patient is INPATIENT. Inpatient status is judged to be reasonable and necessary in order to provide the required intensity of service to ensure the patient's safety. The patient's presenting symptoms, physical exam findings, and initial radiographic and laboratory data in the context of their chronic comorbidities is felt to place them at high risk for further clinical deterioration. Furthermore, it is not anticipated that the patient will be medically stable for  discharge from the hospital within 2 midnights of admission.    I certify that at the point of admission it is my clinical judgment that the patient will require inpatient hospital care spanning beyond 2 midnights from the point of admission due to high intensity of service, high risk for further deterioration and high frequency of surveillance required  Time spent: 56 minutes  Jyoti Harju Sharlette Dense MD [facility] Pager [phone]  If 7PM-7AM, please contact night-coverage [email or web address] Password [facility]  01/21/2023, 8:32 AM

## 2023-01-22 DIAGNOSIS — N12 Tubulo-interstitial nephritis, not specified as acute or chronic: Secondary | ICD-10-CM

## 2023-01-22 DIAGNOSIS — A419 Sepsis, unspecified organism: Secondary | ICD-10-CM | POA: Diagnosis not present

## 2023-01-22 DIAGNOSIS — N390 Urinary tract infection, site not specified: Secondary | ICD-10-CM | POA: Diagnosis not present

## 2023-01-22 LAB — CBC
HCT: 42.1 % (ref 36.0–46.0)
Hemoglobin: 13.3 g/dL (ref 12.0–15.0)
MCH: 26.9 pg (ref 26.0–34.0)
MCHC: 31.6 g/dL (ref 30.0–36.0)
MCV: 85.1 fL (ref 80.0–100.0)
Platelets: 212 10*3/uL (ref 150–400)
RBC: 4.95 MIL/uL (ref 3.87–5.11)
RDW: 14.1 % (ref 11.5–15.5)
WBC: 10.1 10*3/uL (ref 4.0–10.5)
nRBC: 0 % (ref 0.0–0.2)

## 2023-01-22 LAB — COMPREHENSIVE METABOLIC PANEL
ALT: 27 U/L (ref 0–44)
AST: 42 U/L — ABNORMAL HIGH (ref 15–41)
Albumin: 3.5 g/dL (ref 3.5–5.0)
Alkaline Phosphatase: 82 U/L (ref 38–126)
Anion gap: 10 (ref 5–15)
BUN: 10 mg/dL (ref 6–20)
CO2: 27 mmol/L (ref 22–32)
Calcium: 9.7 mg/dL (ref 8.9–10.3)
Chloride: 99 mmol/L (ref 98–111)
Creatinine, Ser: 1.08 mg/dL — ABNORMAL HIGH (ref 0.44–1.00)
GFR, Estimated: >60 mL/min (ref >60)
Glucose, Bld: 248 mg/dL — ABNORMAL HIGH (ref 70–99)
Potassium: 4.2 mmol/L (ref 3.5–5.1)
Sodium: 136 mmol/L (ref 135–145)
Total Bilirubin: 0.7 mg/dL (ref 0.3–1.2)
Total Protein: 7.7 g/dL (ref 6.5–8.1)

## 2023-01-22 LAB — CULTURE, BLOOD (ROUTINE X 2)

## 2023-01-22 LAB — GLUCOSE, CAPILLARY
Glucose-Capillary: 227 mg/dL — ABNORMAL HIGH (ref 70–99)
Glucose-Capillary: 167 mg/dL — ABNORMAL HIGH (ref 70–99)
Glucose-Capillary: 183 mg/dL — ABNORMAL HIGH (ref 70–99)
Glucose-Capillary: 234 mg/dL — ABNORMAL HIGH (ref 70–99)

## 2023-01-22 LAB — URINE CULTURE

## 2023-01-22 MED ORDER — INSULIN GLARGINE-YFGN 100 UNIT/ML ~~LOC~~ SOLN
14.0000 [IU] | Freq: Two times a day (BID) | SUBCUTANEOUS | Status: DC
  Administered 2023-01-22 – 2023-01-23 (×2): 14 [IU] via SUBCUTANEOUS
  Filled 2023-01-22 (×3): qty 0.14

## 2023-01-22 NOTE — Hospital Course (Signed)
52 y.o. female with medical history significant for COPD on nocturnal oxygen, CKD stage IIIa, insulin-dependent type 2 diabetes, hypertension, GERD, history of DVT/PE on Eliquis, grade 1 diastolic dysfunction and history of CVA with left-sided deficits as well as ESBL UTI presenting to the hospital with fevers and chills, found to have recurrent sepsis due to UTI.  Patient states she has been in rehab, in her usual state of health, until she suddenly started having shaking chills and fever about 6 PM last night.  She recognized this as a likely sign of recurrent UTI and sepsis.  Nursing staff at the rehab facility also noticed that she was tachycardic, per the patient.  She was brought in by ambulance from [facility] for evaluation.

## 2023-01-22 NOTE — Progress Notes (Signed)
Mobility Specialist - Progress Note   01/22/23 1238  Mobility  Activity Ambulated with assistance in hallway  Level of Assistance Standby assist, set-up cues, supervision of patient - no hands on  Assistive Device None  Distance Ambulated (ft) 5 ft  Activity Response Tolerated well  Mobility Referral Yes  $Mobility charge 1 Mobility   Pt received in bed and agreed to transfer. Left side deficits, still standby. Mod I for bed mobility and sit to stand. Pt returned to chair with all needs met.  Marilynne Halsted Mobility Specialist

## 2023-01-22 NOTE — Progress Notes (Signed)
  Progress Note   Patient: Karla Hoover XBM:[id number] DOB: October 12, 1970 DOA: 01/21/2023     1 DOS: the patient was seen and examined on 01/22/2023   Brief hospital course: 52 y.o. female with medical history significant for COPD on nocturnal oxygen, CKD stage IIIa, insulin-dependent type 2 diabetes, hypertension, GERD, history of DVT/PE on Eliquis, grade 1 diastolic dysfunction and history of CVA with left-sided deficits as well as ESBL UTI presenting to the hospital with fevers and chills, found to have recurrent sepsis due to UTI.  Patient states she has been in rehab, in her usual state of health, until she suddenly started having shaking chills and fever about 6 PM last night.  She recognized this as a likely sign of recurrent UTI and sepsis.  Nursing staff at the rehab facility also noticed that she was tachycardic, per the patient.  She was brought in by ambulance from [facility] for evaluation.   Assessment and Plan: Severe sepsis secondary to UTI -meeting criteria with tachycardia, leukocytosis, fever, lactate 2.8.   -Given IVF hydration. -Inpatient admission to telemetry -Lactate normalized with IVF -Continue empiric ciprofloxacin -Urine cx pending, thus far pos for gm neg rods   Type 2 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 disease, with long-term current use of insulin -Carb controlled diet -Moderate sliding scale insulin -Increase semglee to 14 units   CKD stage IIIa -renal function at baseline -recheck bmet in AM    COPD with chronic bronchitis (HCC) -currently on room air, with no evidence of exacerbation    Essential hypertension -bp stable at this time    Hemiparesis affecting left side as late effect of cerebrovascular accident (CVA)   hx of PE/DVT-continue Eliquis    Grade I diastolic dysfunction -seems stable at this time    GERD without esophagitis    Hypothyroidism    Dyslipidemia   Subjective: Reports feeling better today  Physical  Exam: Vitals:   01/21/23 2013 01/22/23 0410 01/22/23 1334 01/22/23 1622  BP: 134/76 (!) 158/95 136/80 139/76  Pulse: 95 98 92 94  Resp: Temp: 98.5 F (36.9 C) 99.4 F (37.4 C) 98.8 F (37.1 C) 99.1 F (37.3 C)  TempSrc: Oral Oral Oral Oral  SpO2: 91% 91% 94% 92%  Weight:      Height:       General exam: Awake, laying in bed, in nad Respiratory system: Normal respiratory effort, no wheezing Cardiovascular system: regular rate, s1, s2 Gastrointestinal system: Soft, nondistended, positive BS Central nervous system: CN2-12 grossly intact, strength intact Extremities: Perfused, no clubbing Skin: Normal skin turgor, no notable skin lesions seen Psychiatry: Mood normal // no visual hallucinations   Data Reviewed:  Labs reviewed: Na 136, K 4.2, Cr 1.08, WBC 10.1  Family Communication: Pt in room, family not at bedside  Disposition: Status is: Inpatient Remains inpatient appropriate because: Severity of illness  Planned Discharge Destination: Skilled nursing facility    Author: Rickey Barbara, MD 01/22/2023 4:42 PM  For on call review [email or web address].

## 2023-01-22 NOTE — Inpatient Diabetes Management (Signed)
Inpatient Diabetes Program Recommendations  AACE/ADA: New Consensus Statement on Inpatient Glycemic Control (2015)  Target Ranges:  Prepandial:   less than 140 mg/dL      Peak postprandial:   less than 180 mg/dL (1-2 hours)      Critically ill patients:  140 - 180 mg/dL   Lab Results  Component Value Date   GLUCAP 167 (H) 01/22/2023   HGBA1C 10.5 (H) 09/07/2022    Review of Glycemic Control  Latest Reference Range & Units 01/21/23 08:20 01/21/23 11:59 01/21/23 16:33 01/21/23 20:15 01/22/23 07:36 01/22/23 11:57  Glucose-Capillary 70 - 99 mg/dL 295 (H) 621 (H) 308 (H) 250 (H) 227 (H) 167 (H)   Diabetes history: DM 2 Outpatient Diabetes medications: Trulicity 0.75 mg weekly, Lantus 28 units qhs, Humalog 0-14 units  Current orders for Inpatient glycemic control:  Semglee 10 units bid Novolog 0-15 units tid + hs  Inpatient Diabetes Program Recommendations:    -  Consider increasing Semglee to 14 units bid  Thanks,  Christena Deem RN, MSN, BC-ADM Inpatient Diabetes Coordinator Team Pager [phone] (8a-5p)

## 2023-01-23 ENCOUNTER — Other Ambulatory Visit (HOSPITAL_COMMUNITY): Payer: Self-pay

## 2023-01-23 DIAGNOSIS — N390 Urinary tract infection, site not specified: Secondary | ICD-10-CM | POA: Diagnosis not present

## 2023-01-23 DIAGNOSIS — A419 Sepsis, unspecified organism: Secondary | ICD-10-CM | POA: Diagnosis not present

## 2023-01-23 DIAGNOSIS — N12 Tubulo-interstitial nephritis, not specified as acute or chronic: Secondary | ICD-10-CM | POA: Diagnosis not present

## 2023-01-23 LAB — URINE CULTURE: Culture: >=100000 — AB

## 2023-01-23 LAB — GLUCOSE, CAPILLARY
Glucose-Capillary: 204 mg/dL — ABNORMAL HIGH (ref 70–99)
Glucose-Capillary: 205 mg/dL — ABNORMAL HIGH (ref 70–99)

## 2023-01-23 LAB — CULTURE, BLOOD (ROUTINE X 2): Culture: NO GROWTH

## 2023-01-23 MED ORDER — HYDROXYZINE HCL 10 MG PO TABS: 10.0000 mg | ORAL_TABLET | Freq: Three times a day (TID) | ORAL | Status: DC | PRN

## 2023-01-23 MED ORDER — CIPROFLOXACIN HCL 500 MG PO TABS
500.0000 mg | ORAL_TABLET | Freq: Two times a day (BID) | ORAL | 0 refills | Status: AC
  Filled 2023-01-23: qty 7, 4d supply, fill #0

## 2023-01-23 MED ORDER — CIPROFLOXACIN HCL 500 MG PO TABS
500.0000 mg | ORAL_TABLET | Freq: Two times a day (BID) | ORAL | Status: DC
  Administered 2023-01-23: 500 mg via ORAL
  Filled 2023-01-23: qty 1

## 2023-01-23 MED ORDER — HYDROXYZINE HCL 10 MG PO TABS
10.0000 mg | ORAL_TABLET | Freq: Three times a day (TID) | ORAL | 0 refills | Status: DC | PRN
  Filled 2023-01-23: qty 30, 10d supply, fill #0

## 2023-01-23 NOTE — Progress Notes (Signed)
Report called to Selena Batten at [facility].  IV removed and pt ready for pick up by [facility].

## 2023-01-23 NOTE — Discharge Summary (Signed)
Physician Discharge Summary   Patient: Karla Hoover MRN: [id number] DOB: 08-29-1971  Admit date:     01/21/2023  Discharge date: 01/23/23  Discharge Physician: Rickey Barbara   PCP: Pcp, No   Recommendations at discharge:    Follow up with PCP in 1-2 weeks Please follow up with Urology as already scheduled  Discharge Diagnoses: Principal Problem:   Sepsis secondary to UTI Active Problems:   Type 2 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 disease, with long-term current use of insulin   COPD with chronic bronchitis (HCC)   Essential hypertension   Hemiparesis affecting left side as late effect of cerebrovascular accident (CVA)   hx of PE/DVT   Grade I diastolic dysfunction   GERD without esophagitis   Hypothyroidism   Dyslipidemia   UTI (urinary tract infection)   Leukocytosis  Resolved Problems:   * No resolved hospital problems. *  Hospital Course: 52 y.o. female with medical history significant for COPD on nocturnal oxygen, CKD stage IIIa, insulin-dependent type 2 diabetes, hypertension, GERD, history of DVT/PE on Eliquis, grade 1 diastolic dysfunction and history of CVA with left-sided deficits as well as ESBL UTI presenting to the hospital with fevers and chills, found to have recurrent sepsis due to UTI.  Patient states she has been in rehab, in her usual state of health, until she suddenly started having shaking chills and fever about 6 PM last night.  She recognized this as a likely sign of recurrent UTI and sepsis.  Nursing staff at the rehab facility also noticed that she was tachycardic, per the patient.  She was brought in by ambulance from [facility] for evaluation.   Assessment and Plan: Severe sepsis secondary to UTI -meeting criteria with tachycardia, leukocytosis, fever, lactate 2.8.   -Lactate normalized with IVF -was continued on meropenem -Urine cx confirmed ESBL ecoli sensitive to cipro -While in ED, pt was noted to have some localized  burning and itching of arm while receiving cipro with cipro being listed as an "allergy" -Pt has historically tolerated cipro well without any issues. Pt and family agreed to PO cipro challenge on day of d/c. Some itching noted but no hives noted. Given hydroxyzine for itch. Pt and family aware to stop immediately and notify provider if symptoms worsen   Type 2 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 disease, with long-term current use of insulin -Carb controlled diet -cont insulin regimen   CKD stage IIIa -renal function at baseline     COPD with chronic bronchitis (HCC) -currently on room air, with no evidence of exacerbation     Essential hypertension -bp stable at this time -cont home meds on d/c     Hemiparesis affecting left side as late effect of cerebrovascular accident (CVA)   hx of PE/DVT-continue Eliquis     Grade I diastolic dysfunction -seems stable at this time     GERD without esophagitis     Hypothyroidism     Dyslipidemia    Consultants:  Procedures performed:   Disposition: Skilled nursing facility Diet recommendation:  Carb modified diet DISCHARGE MEDICATION: Allergies as of 01/23/2023       Reactions   Cipro [ciprofloxacin Hcl] Hives   Guaifenesin Anaphylaxis   Kiwi Extract Anaphylaxis, Rash   Levaquin [levofloxacin] Shortness Of Breath, Rash   Strawberry Extract Anaphylaxis, Rash   Lioresal [baclofen] Other (See Comments)   Near syncope/ fall        Medication List     STOP taking  these medications    hydrALAZINE 50 MG tablet Commonly known as: APRESOLINE       TAKE these medications    acetaminophen 500 MG tablet Commonly known as: TYLENOL Take 1,000 mg by mouth in the morning, at noon, and at bedtime.   albuterol 108 (90 Base) MCG/ACT inhaler Commonly known as: VENTOLIN HFA Inhale 2 puffs into the lungs every 6 (six) hours as needed for wheezing or shortness of breath.   apixaban 5 MG Tabs tablet Commonly known as:  Eliquis Take 1 tablet (5mg ) twice daily What changed:  how much to take how to take this when to take this additional instructions   aspirin 81 MG chewable tablet Chew 81 mg by mouth in the morning.   atenolol 25 MG tablet Commonly known as: TENORMIN Take 25 mg by mouth daily.   atorvastatin 80 MG tablet Commonly known as: LIPITOR Take 1 tablet (80 mg total) by mouth daily. What changed: when to take this   B Complex-Biotin-FA Tabs Take 1 tablet by mouth in the morning.   chlorhexidine 0.12 % solution Commonly known as: PERIDEX Use as directed 15 mLs in the mouth or throat every morning. Rinse and spit after oral care. Do not swallow.   ciprofloxacin 500 MG tablet Commonly known as: CIPRO Take 1 tablet (500 mg) by mouth 2 times daily for 7 doses.   Cranberry 250 MG Tabs Take 250 mg by mouth at bedtime.   cyclobenzaprine 10 MG tablet Commonly known as: FLEXERIL Take 1 tablet (10 mg total) by mouth 3 (three) times daily as needed for muscle spasms. What changed: when to take this   EPINEPHrinesnap 1 MG/ML Kit Inject 1 mg as directed as needed (allergic reaction).   ergocalciferol 1.25 MG (50000 UT) capsule Commonly known as: VITAMIN D2 Take 50,000 Units by mouth every Thursday.   eucerin cream Apply 1 Application topically daily as needed for dry skin.   ezetimibe 10 MG tablet Commonly known as: ZETIA Take 10 mg by mouth at bedtime.   Fish Oil 1000 MG Caps Take 1,000 mg by mouth 3 (three) times daily.   gabapentin 400 MG capsule Commonly known as: NEURONTIN Take 1 capsule (400 mg total) by mouth 3 (three) times daily.   hydrOXYzine 10 MG tablet Commonly known as: ATARAX Take 1 tablet (10 mg total) by mouth 3 (three) times daily as needed for itching.   insulin glargine 100 UNIT/ML injection Commonly known as: LANTUS Inject 0.1 mLs (10 Units total) into the skin 2 (two) times daily. (discard vial 28 days after first use) What changed:  how much to  take when to take this   insulin lispro 100 UNIT/ML injection Commonly known as: HUMALOG Inject 0-14 Units into the skin See admin instructions. Inject 0-14 units twice daily as per sliding scale : CBG 0-200 : 0 units CBG 201-250 : 2 units CBG 251-300 : 4 units CBG 301-350 : 6 units CBG 351-400 : 8 units CBG 401-450 : 10 units CBG 451-500 : 12 units (if BP reads high, inject 14 units)   ipratropium-albuterol 0.5-2.5 (3) MG/3ML Soln Commonly known as: DUONEB Inhale 1 vial via nebulizer 3 (three) times daily. What changed: when to take this   LACTOBACILLUS PO Take 1 capsule by mouth daily.   lansoprazole 30 MG capsule Commonly known as: PREVACID Take 30 mg by mouth every morning.   levothyroxine 75 MCG tablet Commonly known as: SYNTHROID Take 75 mcg by mouth in the morning.   magnesium  oxide 400 (240 Mg) MG tablet Commonly known as: MAG-OX Take 400 mg by mouth every other day.   Melatonin 5 MG Tbdp Take 5 mg by mouth at bedtime.   Menthol (Topical Analgesic) 5 % Gel Apply 1 application  topically in the morning. To neck   Nasal Moist Gel Place 1 application  into both nostrils at bedtime.   ondansetron 4 MG tablet Commonly known as: ZOFRAN Take 4 mg by mouth 2 (two) times daily.   OXYGEN Inhale 2 L/min into the lungs as needed (for shortness of breath).   Spiriva Respimat 2.5 MCG/ACT Aers Generic drug: Tiotropium Bromide Monohydrate Inhale 2.5 mcg into the lungs in the morning.   Symbicort 80-4.5 MCG/ACT inhaler Generic drug: budesonide-formoterol Inhale 2 puffs into the lungs 2 (two) times daily.   Trulicity 0.75 MG/0.5ML Sopn Generic drug: Dulaglutide Inject 0.75 mg into the skin every Friday.   witch hazel-glycerin pad Commonly known as: TUCKS Apply 1 Application topically every 4 (four) hours as needed for hemorrhoids.        Follow-up Information     Follow up with PCP in 1-2 weeks Follow up.   Why: Hospital follow up        Please  follow up with Urology as already scheduled Follow up.   Why: Hospital follow up               Discharge Exam: Filed Weights   01/21/23 0120 01/21/23 0153  Weight: 90.8 kg 97.5 kg   General exam: Awake, laying in bed, in nad Respiratory system: Normal respiratory effort, no wheezing Cardiovascular system: regular rate, s1, s2 Gastrointestinal system: Soft, nondistended, positive BS Central nervous system: CN2-12 grossly intact, strength intact Extremities: Perfused, no clubbing Skin: Normal skin turgor, no notable skin lesions seen Psychiatry: Mood normal // no visual hallucinations   Condition at discharge: fair  The results of significant diagnostics from this hospitalization (including imaging, microbiology, ancillary and laboratory) are listed below for reference.   Imaging Studies: DG Chest Port 1 View  Result Date: 01/21/2023 CLINICAL DATA:  Fever, shortness of breath EXAM: PORTABLE CHEST 1 VIEW COMPARISON:  12/13/2022 FINDINGS: Mild left lower lobe opacity, atelectasis versus pneumonia. Right lung is clear. No pleural effusion or pneumothorax. The heart is normal in size. IMPRESSION: Mild left lower lobe opacity, atelectasis versus pneumonia. Electronically Signed   By: Charline Bills M.D.   On: 01/21/2023 02:42    Microbiology: Results for orders placed or performed during the hospital encounter of 01/21/23  Blood Culture (routine x 2)     Status: None (Preliminary result)   Collection Time: 01/21/23  2:15 AM   Specimen: BLOOD LEFT FOREARM  Result Value Ref Range Status   Specimen Description   Final    BLOOD LEFT FOREARM Performed at [facility], [address], [place], [place] [address]    Special Requests   Final    BOTTLES DRAWN AEROBIC AND ANAEROBIC Blood Culture results may not be optimal due to an inadequate volume of blood received in culture bottles Performed at [facility], [address] [place], [place]  [address]    Culture   Final    NO GROWTH 2 DAYS Performed at [facility], [address], [place], [place] [address]    Report Status PENDING  Incomplete  Blood Culture (routine x 2)     Status: None (Preliminary result)   Collection Time: 01/21/23  2:30 AM   Specimen: BLOOD  Result Value Ref Range Status   Specimen Description BLOOD SITE NOT SPECIFIED  Final   Special Requests   Final    BOTTLES DRAWN AEROBIC AND ANAEROBIC Blood Culture adequate volume   Culture   Final    NO GROWTH 2 DAYS Performed at [facility], [address], [place], [place] [address]    Report Status PENDING  Incomplete  Urine Culture     Status: Abnormal   Collection Time: 01/21/23  2:30 AM   Specimen: Urine, Random  Result Value Ref Range Status   Specimen Description URINE, RANDOM  Final   Special Requests NONE Reflexed from [id number]  Final   Culture (A)  Final    >=100,000 COLONIES/mL ESCHERICHIA COLI Confirmed Extended Spectrum Beta-Lactamase Producer (ESBL).  In bloodstream infections from ESBL organisms, carbapenems are preferred over piperacillin/tazobactam. They are shown to have a lower risk of mortality.    Report Status 01/23/2023 FINAL  Final   Organism ID, Bacteria ESCHERICHIA COLI (A)  Final      Susceptibility   Escherichia coli - MIC*    AMPICILLIN >=32 RESISTANT Resistant     CEFAZOLIN >=64 RESISTANT Resistant     CEFEPIME 16 RESISTANT Resistant     CEFTRIAXONE >=64 RESISTANT Resistant     CIPROFLOXACIN <=0.25 SENSITIVE Sensitive     GENTAMICIN >=16 RESISTANT Resistant     IMIPENEM <=0.25 SENSITIVE Sensitive     NITROFURANTOIN <=16 SENSITIVE Sensitive     TRIMETH/SULFA >=320 RESISTANT Resistant     AMPICILLIN/SULBACTAM >=32 RESISTANT Resistant     PIP/TAZO <=4 SENSITIVE Sensitive     * >=100,000 COLONIES/mL ESCHERICHIA COLI    Labs: CBC: Recent Labs  Lab 01/21/23 0230 01/22/23 0536  WBC 13.1* 10.1  NEUTROABS 10.4*  --   HGB 12.1 13.3  HCT 37.9 42.1  MCV 83.5  85.1  PLT 196 212   Basic Metabolic Panel: Recent Labs  Lab 01/21/23 0230 01/22/23 0536  NA 133* 136  K 4.8 4.2  CL 97* 99  CO2 25 27  GLUCOSE 339* 248*  BUN 15 10  CREATININE 1.15* 1.08*  CALCIUM 9.3 9.7   Liver Function Tests: Recent Labs  Lab 01/21/23 0230 01/22/23 0536  AST 42* 42*  ALT 25 27  ALKPHOS 81 82  BILITOT 0.7 0.7  PROT 7.2 7.7  ALBUMIN 3.4* 3.5   CBG: Recent Labs  Lab 01/22/23 1157 01/22/23 1624 01/22/23 2048 01/23/23 0722 01/23/23 1137  GLUCAP 167* 183* 234* 204* 205*    Discharge time spent: less than 30 minutes.  Signed: Rickey Barbara, MD [facility] 01/23/2023

## 2023-01-23 NOTE — TOC Transition Note (Signed)
Transition of Care [facility]) - CM/SW Discharge Note   Patient Details  Name: Karla Hoover MRN: [id number] Date of Birth: 1970-11-18  Transition of Care [facility]) CM/SW Contact:  Otelia Santee, LCSW Phone Number: 01/23/2023, 11:21 AM   Clinical Narrative:    Pt to return to [facility] where she is a LTC resident. Pt will be going to room 305. RN to call report [phone]. Pt will be transported to facility via [facility]. [facility] called at 3:15pm.   Final next level of care: Long Term Nursing Home Barriers to Discharge: Barriers Resolved   Patient Goals and CMS Choice CMS Medicare.gov Compare Post Acute Care list provided to:: Patient Choice offered to / list presented to : Patient  Discharge Placement                Patient chooses bed at: [facility] Patient to be transferred to facility by: [facility] Name of family member notified: Patient Patient and family notified of of transfer: 01/23/23  Discharge Plan and Services Additional resources added to the After Visit Summary for   In-house Referral: NA Discharge Planning Services: NA Post Acute Care Choice: Resumption of Svcs/PTA Provider          DME Arranged: N/A DME Agency: NA                  Social Determinants of Health (SDOH) Interventions SDOH Screenings   Food Insecurity: No Food Insecurity (01/23/2023)  Housing: Low Risk  (01/23/2023)  Transportation Needs: No Transportation Needs (01/23/2023)  Utilities: Not At Risk (01/23/2023)  Depression (PHQ2-9): Medium Risk (10/04/2019)  Tobacco Use: Medium Risk (01/21/2023)     Readmission Risk Interventions    01/21/2023   12:08 PM 12/18/2022   11:10 AM 01/04/2022    2:25 PM  Readmission Risk Prevention Plan  Transportation Screening Complete Complete Complete  PCP or Specialist Appt within 5-7 Days Complete    PCP or Specialist Appt within 3-5 Days  Complete Complete  Home Care Screening Complete    Medication Review (RN CM) Complete    HRI or Home Care  Consult  Complete Complete  Social Work Consult for Recovery Care Planning/Counseling  Complete   Palliative Care Screening  Not Applicable Not Applicable  Medication Review Oceanographer)  Complete Complete

## 2023-01-23 NOTE — [place]2 (Signed)
[place] MEDICAID [place]2 LEVEL OF CARE FORM     IDENTIFICATION  Patient Name: Karla Hoover Birthdate: [birth year] Sex: female Admission Date (Current Location): 01/21/2023  [facility] and [place] Number:  Producer, television/film/video and Address:  [facility],  [address] [place], [place] [address]      Provider Number: [id number]  Attending Physician Name and Address:  Jerald Kief, MD  Relative Name and Phone Number:  Carollee Sires [phone]    Current Level of Care: Hospital Recommended Level of Care: Nursing Facility Prior Approval Number:    Date Approved/Denied:   PASRR Number:    Discharge Plan: SNF    Current Diagnoses: Patient Active Problem List   Diagnosis Date Noted   UTI (urinary tract infection) 01/21/2023   Leukocytosis 01/21/2023   Class 1 obesity due to excess calories with body mass index (BMI) of 32.0 to 32.9 in adult 12/18/2022   Dyslipidemia 12/18/2022   Sepsis due to urinary tract infection 12/13/2022   Hyponatremia 12/13/2022   CKD (chronic kidney disease) stage 3, GFR 30-59 ml/min 12/13/2022   Sepsis 09/07/2022   AKI (acute kidney injury) 08/30/2022   History of pulmonary embolism 08/30/2022   Hemiparesis affecting left side as late effect of cerebrovascular accident (CVA) 08/30/2022   Allergies    Grade I diastolic dysfunction 12/02/2021   Mild protein-calorie malnutrition 12/02/2021   COPD with acute exacerbation 12/02/2021   Acute renal failure superimposed on stage 3a chronic kidney disease 02/09/2021   Chronic respiratory failure with hypoxia 02/09/2021   History of thromboembolism 02/09/2021   Type 2 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 disease, with long-term current use of insulin 02/09/2021   Mixed 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 02/09/2021   GERD without esophagitis 02/09/2021   Hypothyroidism 02/09/2021   Sepsis secondary to UTI 02/08/2021   Lymphadenopathy 12/28/2020   hx of  PE/DVT 12/01/2019   Overweight (BMI 25.0-29.9) 11/22/2019   Tobacco abuse 11/16/2019   DM (diabetes mellitus), type 2 11/15/2019   History of stroke 11/15/2019   Essential hypertension 05/04/2009   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    Orientation RESPIRATION BLADDER Height & Weight     Self, Time, Situation, Place  Normal Continent Weight: 215 lb (97.5 kg) Height:   (167.6 cm)  BEHAVIORAL SYMPTOMS/MOOD NEUROLOGICAL BOWEL NUTRITION STATUS      Continent Diet (Regular)  AMBULATORY STATUS COMMUNICATION OF NEEDS Skin   Supervision Verbally Normal                       Personal Care Assistance Level of Assistance  Bathing, Feeding, Dressing Bathing Assistance: Limited assistance Feeding assistance: Independent Dressing Assistance: Limited assistance     Functional Limitations Info  Sight, Speech, Hearing Sight Info: Adequate Hearing Info: Adequate Speech Info: Adequate    SPECIAL CARE FACTORS FREQUENCY                       Contractures Contractures Info: Not present    Additional Factors Info  Code Status, Allergies Code Status Info: FULL Allergies Info: Cipro (Ciprofloxacin Hcl), Guaifenesin, Kiwi Extract, Levaquin (Levofloxacin), Strawberry Extract, Lioresal (Baclofen)           Current Medications (01/23/2023):  This is the current hospital active medication list Current Facility-Administered Medications  Medication Dose Route Frequency Provider Last Rate Last Admin   acetaminophen (TYLENOL) tablet 650 mg  650 mg Oral Q6H PRN Kirby Crigler, Mir  M, MD   650 mg at 01/22/23 1445   Or   acetaminophen (TYLENOL) suppository 650 mg  650 mg Rectal Q6H PRN Kirby Crigler, Mir M, MD       albuterol (PROVENTIL) (2.5 MG/3ML) 0.083% nebulizer solution 2.5 mg  2.5 mg Nebulization Q2H PRN Kirby Crigler, Mir M, MD       apixaban Everlene Balls) tablet 5 mg  5 mg Oral BID Kirby Crigler, Mir M, MD   5 mg at 01/23/23 1610   aspirin chewable tablet 81 mg  81 mg Oral q AM  Kirby Crigler, Mir M, MD   81 mg at 01/23/23 0631   cyclobenzaprine (FLEXERIL) tablet 10 mg  10 mg Oral Q8H PRN Maryln Gottron, MD   10 mg at 01/22/23 1445   gabapentin (NEURONTIN) capsule 400 mg  400 mg Oral TID Maryln Gottron, MD   400 mg at 01/23/23 0950   insulin aspart (novoLOG) injection 0-15 Units  0-15 Units Subcutaneous TID WC Maryln Gottron, MD   5 Units at 01/23/23 0729   insulin aspart (novoLOG) injection 0-5 Units  0-5 Units Subcutaneous QHS Maryln Gottron, MD   2 Units at 01/22/23 2237   insulin glargine-yfgn (SEMGLEE) injection 14 Units  14 Units Subcutaneous BID Jerald Kief, MD   14 Units at 01/23/23 0950   levothyroxine (SYNTHROID) tablet 75 mcg  75 mcg Oral Q0600 Maryln Gottron, MD   75 mcg at 01/23/23 9604   meropenem (MERREM) 1 g in sodium chloride 0.9 % 100 mL IVPB  1 g Intravenous Q8H Kirby Crigler, Mir M, MD 200 mL/hr at 01/23/23 0637 1 g at 01/23/23 0637   metoprolol succinate (TOPROL-XL) 24 hr tablet 50 mg  50 mg Oral q AM Dow Adolph N, DO   50 mg at 01/23/23 5409   metoprolol tartrate (LOPRESSOR) injection 5 mg  5 mg Intravenous Q6H PRN Kirby Crigler, Mir M, MD       mometasone-formoterol [facility]) 100-5 MCG/ACT inhaler 2 puff  2 puff Inhalation BID Kirby Crigler, Mir M, MD   2 puff at 01/23/23 0847   ondansetron (ZOFRAN) tablet 4 mg  4 mg Oral Q6H PRN Kirby Crigler, Mir M, MD       Or   ondansetron [facility]) injection 4 mg  4 mg Intravenous Q6H PRN Kirby Crigler, Mir M, MD       pantoprazole (PROTONIX) EC tablet 20 mg  20 mg Oral Daily Kirby Crigler, Mir M, MD   20 mg at 01/23/23 0950   senna-docusate (Senokot-S) tablet 1 tablet  1 tablet Oral QHS PRN Kirby Crigler, Mir M, MD       umeclidinium bromide (INCRUSE ELLIPTA) 62.5 MCG/ACT 1 puff  1 puff Inhalation Daily Kirby Crigler, Mir M, MD   1 puff at 01/23/23 0847     Discharge Medications: Please see discharge summary for a list of discharge medications.  Relevant Imaging Results:  Relevant Lab Results:   Additional  Information SSN: [id number]  Otelia Santee, LCSW

## 2023-01-24 ENCOUNTER — Other Ambulatory Visit (HOSPITAL_COMMUNITY): Payer: Self-pay

## 2023-01-24 LAB — CULTURE, BLOOD (ROUTINE X 2)

## 2023-01-25 LAB — CULTURE, BLOOD (ROUTINE X 2): Special Requests: ADEQUATE

## 2023-01-26 LAB — CULTURE, BLOOD (ROUTINE X 2): Culture: NO GROWTH

## 2023-01-31 ENCOUNTER — Other Ambulatory Visit (HOSPITAL_COMMUNITY): Payer: Self-pay

## 2023-02-04 ENCOUNTER — Ambulatory Visit
Admission: RE | Admit: 2023-02-04 | Discharge: 2023-02-04 | Disposition: A | Discharge status: Home / Self Care | Payer: Medicaid Other | Source: Ambulatory Visit | Attending: Family Medicine | Admitting: Family Medicine

## 2023-02-04 DIAGNOSIS — N2889 Other specified disorders of kidney and ureter: Secondary | ICD-10-CM

## 2023-03-07 ENCOUNTER — Other Ambulatory Visit: Payer: Self-pay | Admitting: Family Medicine

## 2023-03-07 DIAGNOSIS — Z1231 Encounter for screening mammogram for malignant neoplasm of breast: Secondary | ICD-10-CM

## 2023-03-12 ENCOUNTER — Other Ambulatory Visit: Payer: Self-pay | Admitting: Nurse Practitioner

## 2023-03-12 DIAGNOSIS — N2889 Other specified disorders of kidney and ureter: Secondary | ICD-10-CM

## 2023-03-28 ENCOUNTER — Ambulatory Visit: Payer: Medicaid Other

## 2023-04-01 ENCOUNTER — Ambulatory Visit: Payer: Medicaid Other

## 2023-04-08 ENCOUNTER — Other Ambulatory Visit: Payer: Self-pay | Admitting: Nurse Practitioner

## 2023-04-08 ENCOUNTER — Ambulatory Visit
Admission: RE | Admit: 2023-04-08 | Discharge: 2023-04-08 | Disposition: A | Discharge status: Home / Self Care | Payer: Medicaid Other | Source: Ambulatory Visit | Attending: Nurse Practitioner | Admitting: Nurse Practitioner

## 2023-04-08 DIAGNOSIS — N2889 Other specified disorders of kidney and ureter: Secondary | ICD-10-CM

## 2023-07-30 ENCOUNTER — Ambulatory Visit
Admission: RE | Admit: 2023-07-30 | Discharge: 2023-07-30 | Disposition: A | Discharge status: Home / Self Care | Payer: Medicaid Other | Source: Ambulatory Visit | Attending: Family Medicine

## 2023-07-30 DIAGNOSIS — Z1231 Encounter for screening mammogram for malignant neoplasm of breast: Secondary | ICD-10-CM

## 2023-09-06 ENCOUNTER — Other Ambulatory Visit: Payer: Self-pay

## 2023-09-06 ENCOUNTER — Encounter (HOSPITAL_COMMUNITY): Payer: Self-pay | Admitting: Internal Medicine

## 2023-09-06 ENCOUNTER — Inpatient Hospital Stay (HOSPITAL_COMMUNITY)
Admission: EM | Admit: 2023-09-06 | Discharge: 2023-09-10 | DRG: 871 | Disposition: A | Discharge status: Skilled Nursing Facility | Payer: Medicaid Other | Source: Skilled Nursing Facility | Attending: Family Medicine | Admitting: Family Medicine

## 2023-09-06 ENCOUNTER — Emergency Department (HOSPITAL_COMMUNITY): Payer: Medicaid Other

## 2023-09-06 DIAGNOSIS — Z8249 Family history of ischemic heart disease and other diseases of the circulatory system: Secondary | ICD-10-CM

## 2023-09-06 DIAGNOSIS — Z87892 Personal history of anaphylaxis: Secondary | ICD-10-CM

## 2023-09-06 DIAGNOSIS — Z7901 Long term (current) use of anticoagulants: Secondary | ICD-10-CM

## 2023-09-06 DIAGNOSIS — N390 Urinary tract infection, site not specified: Secondary | ICD-10-CM | POA: Diagnosis present

## 2023-09-06 DIAGNOSIS — E1169 Type 2 diabetes mellitus with other specified complication: Secondary | ICD-10-CM | POA: Diagnosis present

## 2023-09-06 DIAGNOSIS — E66811 Obesity, class 1: Secondary | ICD-10-CM | POA: Diagnosis not present

## 2023-09-06 DIAGNOSIS — Z6834 Body mass index (BMI) 34.0-34.9, adult: Secondary | ICD-10-CM

## 2023-09-06 DIAGNOSIS — E039 Hypothyroidism, unspecified: Secondary | ICD-10-CM | POA: Diagnosis present

## 2023-09-06 DIAGNOSIS — B9629 Other Escherichia coli [E. coli] as the cause of diseases classified elsewhere: Secondary | ICD-10-CM | POA: Diagnosis not present

## 2023-09-06 DIAGNOSIS — Z9109 Other allergy status, other than to drugs and biological substances: Secondary | ICD-10-CM

## 2023-09-06 DIAGNOSIS — A419 Sepsis, unspecified organism: Principal | ICD-10-CM | POA: Diagnosis present

## 2023-09-06 DIAGNOSIS — I69354 Hemiplegia and hemiparesis following cerebral infarction affecting left non-dominant side: Secondary | ICD-10-CM

## 2023-09-06 DIAGNOSIS — Z86711 Personal history of pulmonary embolism: Secondary | ICD-10-CM

## 2023-09-06 DIAGNOSIS — N1831 Chronic kidney disease, stage 3a: Secondary | ICD-10-CM | POA: Diagnosis present

## 2023-09-06 DIAGNOSIS — Z1612 Extended spectrum beta lactamase (ESBL) resistance: Secondary | ICD-10-CM | POA: Diagnosis present

## 2023-09-06 DIAGNOSIS — I130 Hypertensive heart and chronic kidney disease with heart failure and stage 1 through stage 4 chronic kidney disease, or unspecified chronic kidney disease: Secondary | ICD-10-CM | POA: Diagnosis present

## 2023-09-06 DIAGNOSIS — Z825 Family history of asthma and other chronic lower respiratory diseases: Secondary | ICD-10-CM

## 2023-09-06 DIAGNOSIS — Z86718 Personal history of other venous thrombosis and embolism: Secondary | ICD-10-CM

## 2023-09-06 DIAGNOSIS — I5032 Chronic diastolic (congestive) heart failure: Secondary | ICD-10-CM | POA: Diagnosis present

## 2023-09-06 DIAGNOSIS — J9622 Acute and chronic respiratory failure with hypercapnia: Secondary | ICD-10-CM | POA: Diagnosis present

## 2023-09-06 DIAGNOSIS — Z720 Tobacco use: Secondary | ICD-10-CM | POA: Diagnosis not present

## 2023-09-06 DIAGNOSIS — A4151 Sepsis due to Escherichia coli [E. coli]: Secondary | ICD-10-CM | POA: Diagnosis present

## 2023-09-06 DIAGNOSIS — E785 Hyperlipidemia, unspecified: Secondary | ICD-10-CM | POA: Diagnosis not present

## 2023-09-06 DIAGNOSIS — Z87440 Personal history of urinary (tract) infections: Secondary | ICD-10-CM

## 2023-09-06 DIAGNOSIS — L03116 Cellulitis of left lower limb: Secondary | ICD-10-CM | POA: Diagnosis present

## 2023-09-06 DIAGNOSIS — Z1152 Encounter for screening for COVID-19: Secondary | ICD-10-CM

## 2023-09-06 DIAGNOSIS — I10 Essential (primary) hypertension: Secondary | ICD-10-CM | POA: Diagnosis not present

## 2023-09-06 DIAGNOSIS — N1832 Chronic kidney disease, stage 3b: Secondary | ICD-10-CM | POA: Diagnosis not present

## 2023-09-06 DIAGNOSIS — Z881 Allergy status to other antibiotic agents status: Secondary | ICD-10-CM

## 2023-09-06 DIAGNOSIS — E1122 Type 2 diabetes mellitus with diabetic chronic kidney disease: Secondary | ICD-10-CM | POA: Diagnosis present

## 2023-09-06 DIAGNOSIS — J9621 Acute and chronic respiratory failure with hypoxia: Secondary | ICD-10-CM | POA: Diagnosis present

## 2023-09-06 DIAGNOSIS — E782 Mixed hyperlipidemia: Secondary | ICD-10-CM | POA: Diagnosis present

## 2023-09-06 DIAGNOSIS — Z7951 Long term (current) use of inhaled steroids: Secondary | ICD-10-CM

## 2023-09-06 DIAGNOSIS — J4489 Other specified chronic obstructive pulmonary disease: Secondary | ICD-10-CM | POA: Diagnosis present

## 2023-09-06 DIAGNOSIS — G9341 Metabolic encephalopathy: Secondary | ICD-10-CM | POA: Diagnosis present

## 2023-09-06 DIAGNOSIS — I959 Hypotension, unspecified: Secondary | ICD-10-CM | POA: Diagnosis present

## 2023-09-06 DIAGNOSIS — Z79890 Hormone replacement therapy (postmenopausal): Secondary | ICD-10-CM

## 2023-09-06 DIAGNOSIS — Z803 Family history of malignant neoplasm of breast: Secondary | ICD-10-CM

## 2023-09-06 DIAGNOSIS — Z8673 Personal history of transient ischemic attack (TIA), and cerebral infarction without residual deficits: Secondary | ICD-10-CM

## 2023-09-06 DIAGNOSIS — E6609 Other obesity due to excess calories: Secondary | ICD-10-CM | POA: Diagnosis not present

## 2023-09-06 DIAGNOSIS — E1165 Type 2 diabetes mellitus with hyperglycemia: Secondary | ICD-10-CM | POA: Diagnosis present

## 2023-09-06 DIAGNOSIS — Z833 Family history of diabetes mellitus: Secondary | ICD-10-CM

## 2023-09-06 DIAGNOSIS — I8290 Acute embolism and thrombosis of unspecified vein: Secondary | ICD-10-CM | POA: Diagnosis present

## 2023-09-06 DIAGNOSIS — R6520 Severe sepsis without septic shock: Secondary | ICD-10-CM | POA: Diagnosis present

## 2023-09-06 DIAGNOSIS — E872 Acidosis, unspecified: Secondary | ICD-10-CM | POA: Diagnosis present

## 2023-09-06 DIAGNOSIS — M7989 Other specified soft tissue disorders: Secondary | ICD-10-CM | POA: Diagnosis not present

## 2023-09-06 DIAGNOSIS — N179 Acute kidney failure, unspecified: Secondary | ICD-10-CM | POA: Diagnosis present

## 2023-09-06 DIAGNOSIS — Z7982 Long term (current) use of aspirin: Secondary | ICD-10-CM

## 2023-09-06 DIAGNOSIS — E871 Hypo-osmolality and hyponatremia: Secondary | ICD-10-CM | POA: Diagnosis not present

## 2023-09-06 DIAGNOSIS — L89816 Pressure-induced deep tissue damage of head: Secondary | ICD-10-CM | POA: Diagnosis not present

## 2023-09-06 DIAGNOSIS — Z794 Long term (current) use of insulin: Secondary | ICD-10-CM

## 2023-09-06 DIAGNOSIS — Z79899 Other long term (current) drug therapy: Secondary | ICD-10-CM

## 2023-09-06 DIAGNOSIS — I5189 Other ill-defined heart diseases: Secondary | ICD-10-CM

## 2023-09-06 DIAGNOSIS — E869 Volume depletion, unspecified: Secondary | ICD-10-CM | POA: Diagnosis present

## 2023-09-06 DIAGNOSIS — Z888 Allergy status to other drugs, medicaments and biological substances status: Secondary | ICD-10-CM

## 2023-09-06 DIAGNOSIS — E875 Hyperkalemia: Secondary | ICD-10-CM | POA: Diagnosis present

## 2023-09-06 DIAGNOSIS — Z7985 Long-term (current) use of injectable non-insulin antidiabetic drugs: Secondary | ICD-10-CM

## 2023-09-06 DIAGNOSIS — K219 Gastro-esophageal reflux disease without esophagitis: Secondary | ICD-10-CM | POA: Diagnosis present

## 2023-09-06 DIAGNOSIS — Z792 Long term (current) use of antibiotics: Secondary | ICD-10-CM

## 2023-09-06 DIAGNOSIS — B961 Klebsiella pneumoniae [K. pneumoniae] as the cause of diseases classified elsewhere: Secondary | ICD-10-CM | POA: Diagnosis present

## 2023-09-06 DIAGNOSIS — R0609 Other forms of dyspnea: Secondary | ICD-10-CM | POA: Diagnosis not present

## 2023-09-06 DIAGNOSIS — E119 Type 2 diabetes mellitus without complications: Secondary | ICD-10-CM

## 2023-09-06 HISTORY — DX: Bipolar disorder, unspecified: F31.9

## 2023-09-06 HISTORY — DX: Metabolic encephalopathy: G93.41

## 2023-09-06 LAB — COMPREHENSIVE METABOLIC PANEL
ALT: 25 U/L (ref 0–44)
AST: 65 U/L — ABNORMAL HIGH (ref 15–41)
Albumin: 4.1 g/dL (ref 3.5–5.0)
Alkaline Phosphatase: 85 U/L (ref 38–126)
Anion gap: 11 (ref 5–15)
BUN: 43 mg/dL — ABNORMAL HIGH (ref 6–20)
CO2: 27 mmol/L (ref 22–32)
Calcium: 9.4 mg/dL (ref 8.9–10.3)
Chloride: 94 mmol/L — ABNORMAL LOW (ref 98–111)
Creatinine, Ser: 3.22 mg/dL — ABNORMAL HIGH (ref 0.44–1.00)
GFR, Estimated: 17 mL/min — ABNORMAL LOW (ref >60)
Glucose, Bld: 401 mg/dL — ABNORMAL HIGH (ref 70–99)
Potassium: 6.2 mmol/L — ABNORMAL HIGH (ref 3.5–5.1)
Sodium: 132 mmol/L — ABNORMAL LOW (ref 135–145)
Total Bilirubin: 0.6 mg/dL (ref <1.2)
Total Protein: 9 g/dL — ABNORMAL HIGH (ref 6.5–8.1)

## 2023-09-06 LAB — BASIC METABOLIC PANEL
Anion gap: 8 (ref 5–15)
Anion gap: 10 (ref 5–15)
BUN: 43 mg/dL — ABNORMAL HIGH (ref 6–20)
BUN: 42 mg/dL — ABNORMAL HIGH (ref 6–20)
CO2: 26 mmol/L (ref 22–32)
CO2: 26 mmol/L (ref 22–32)
Calcium: 8.7 mg/dL — ABNORMAL LOW (ref 8.9–10.3)
Calcium: 9 mg/dL (ref 8.9–10.3)
Chloride: 101 mmol/L (ref 98–111)
Chloride: 100 mmol/L (ref 98–111)
Creatinine, Ser: 3.07 mg/dL — ABNORMAL HIGH (ref 0.44–1.00)
Creatinine, Ser: 2.81 mg/dL — ABNORMAL HIGH (ref 0.44–1.00)
GFR, Estimated: 18 mL/min — ABNORMAL LOW (ref >60)
GFR, Estimated: 20 mL/min — ABNORMAL LOW (ref >60)
Glucose, Bld: 319 mg/dL — ABNORMAL HIGH (ref 70–99)
Glucose, Bld: 218 mg/dL — ABNORMAL HIGH (ref 70–99)
Potassium: 7.2 mmol/L (ref 3.5–5.1)
Potassium: 6.1 mmol/L — ABNORMAL HIGH (ref 3.5–5.1)
Sodium: 135 mmol/L (ref 135–145)
Sodium: 136 mmol/L (ref 135–145)

## 2023-09-06 LAB — CBC WITH DIFFERENTIAL/PLATELET
Abs Immature Granulocytes: 0.09 10*3/uL — ABNORMAL HIGH (ref 0.00–0.07)
Basophils Absolute: 0 10*3/uL (ref 0.0–0.1)
Basophils Relative: 0 %
Eosinophils Absolute: 0 10*3/uL (ref 0.0–0.5)
Eosinophils Relative: 0 %
HCT: 46.4 % — ABNORMAL HIGH (ref 36.0–46.0)
Hemoglobin: 14.2 g/dL (ref 12.0–15.0)
Immature Granulocytes: 1 %
Lymphocytes Relative: 15 %
Lymphs Abs: 2.3 10*3/uL (ref 0.7–4.0)
MCH: 27.3 pg (ref 26.0–34.0)
MCHC: 30.6 g/dL (ref 30.0–36.0)
MCV: 89.2 fL (ref 80.0–100.0)
Monocytes Absolute: 0.6 10*3/uL (ref 0.1–1.0)
Monocytes Relative: 4 %
Neutro Abs: 12.4 10*3/uL — ABNORMAL HIGH (ref 1.7–7.7)
Neutrophils Relative %: 80 %
Platelets: 306 10*3/uL (ref 150–400)
RBC: 5.2 MIL/uL — ABNORMAL HIGH (ref 3.87–5.11)
RDW: 14.5 % (ref 11.5–15.5)
WBC: 15.5 10*3/uL — ABNORMAL HIGH (ref 4.0–10.5)
nRBC: 0 % (ref 0.0–0.2)

## 2023-09-06 LAB — GLUCOSE, CAPILLARY
Glucose-Capillary: 155 mg/dL — ABNORMAL HIGH (ref 70–99)
Glucose-Capillary: 198 mg/dL — ABNORMAL HIGH (ref 70–99)
Glucose-Capillary: 263 mg/dL — ABNORMAL HIGH (ref 70–99)

## 2023-09-06 LAB — MRSA NEXT GEN BY PCR, NASAL: MRSA by PCR Next Gen: NOT DETECTED

## 2023-09-06 LAB — RESP PANEL BY RT-PCR (RSV, FLU A&B, COVID)  RVPGX2
Influenza A by PCR: NEGATIVE
Influenza B by PCR: NEGATIVE
Resp Syncytial Virus by PCR: NEGATIVE
SARS Coronavirus 2 by RT PCR: NEGATIVE

## 2023-09-06 LAB — BLOOD GAS, VENOUS
Acid-Base Excess: 0.4 mmol/L (ref 0.0–2.0)
Bicarbonate: 29.6 mmol/L — ABNORMAL HIGH (ref 20.0–28.0)
O2 Saturation: 77.7 %
Patient temperature: 37
pCO2, Ven: 69 mm[Hg] — ABNORMAL HIGH (ref 44–60)
pH, Ven: 7.24 — ABNORMAL LOW (ref 7.25–7.43)
pO2, Ven: 48 mm[Hg] — ABNORMAL HIGH (ref 32–45)

## 2023-09-06 LAB — URINALYSIS, ROUTINE W REFLEX MICROSCOPIC
Bilirubin Urine: NEGATIVE
Glucose, UA: >=500 mg/dL — AB
Ketones, ur: NEGATIVE mg/dL
Nitrite: NEGATIVE
Protein, ur: 100 mg/dL — AB
RBC / HPF: >50 RBC/hpf (ref 0–5)
Specific Gravity, Urine: 1.018 (ref 1.005–1.030)
WBC, UA: >50 WBC/hpf (ref 0–5)
pH: 6 (ref 5.0–8.0)

## 2023-09-06 LAB — I-STAT CG4 LACTIC ACID, ED: Lactic Acid, Venous: 2.1 mmol/L (ref 0.5–1.9)

## 2023-09-06 LAB — TROPONIN I (HIGH SENSITIVITY)
Troponin I (High Sensitivity): 6 ng/L (ref <18)
Troponin I (High Sensitivity): 7 ng/L (ref <18)

## 2023-09-06 LAB — LIPASE, BLOOD: Lipase: 27 U/L (ref 11–51)

## 2023-09-06 LAB — CBG MONITORING, ED
Glucose-Capillary: 399 mg/dL — ABNORMAL HIGH (ref 70–99)
Glucose-Capillary: 294 mg/dL — ABNORMAL HIGH (ref 70–99)

## 2023-09-06 LAB — HEMOGLOBIN A1C
Hgb A1c MFr Bld: 10.3 % — ABNORMAL HIGH (ref 4.8–5.6)
Mean Plasma Glucose: 248.91 mg/dL

## 2023-09-06 LAB — CG4 I-STAT (LACTIC ACID): Lactic Acid, Venous: 1.1 mmol/L (ref 0.5–1.9)

## 2023-09-06 LAB — LACTIC ACID, PLASMA: Lactic Acid, Venous: 2.9 mmol/L (ref 0.5–1.9)

## 2023-09-06 LAB — POTASSIUM: Potassium: 7 mmol/L (ref 3.5–5.1)

## 2023-09-06 MED ORDER — APIXABAN 5 MG PO TABS
5.0000 mg | ORAL_TABLET | Freq: Two times a day (BID) | ORAL | Status: DC
  Administered 2023-09-07 – 2023-09-10 (×7): 5 mg via ORAL
  Filled 2023-09-06 (×7): qty 1

## 2023-09-06 MED ORDER — VANCOMYCIN HCL IN DEXTROSE 1-5 GM/200ML-% IV SOLN: 1000.0000 mg | Freq: Once | INTRAVENOUS | Status: DC

## 2023-09-06 MED ORDER — SODIUM CHLORIDE 0.9 % IV SOLN
1.0000 g | Freq: Two times a day (BID) | INTRAVENOUS | Status: DC
  Administered 2023-09-07 – 2023-09-08 (×3): 1 g via INTRAVENOUS
  Filled 2023-09-06 (×4): qty 20

## 2023-09-06 MED ORDER — ACETAMINOPHEN 325 MG PO TABS
650.0000 mg | ORAL_TABLET | Freq: Four times a day (QID) | ORAL | Status: DC | PRN
  Administered 2023-09-07 – 2023-09-08 (×2): 650 mg via ORAL
  Filled 2023-09-06 (×2): qty 2

## 2023-09-06 MED ORDER — ONDANSETRON HCL 4 MG/2ML IJ SOLN
4.0000 mg | Freq: Four times a day (QID) | INTRAMUSCULAR | Status: DC | PRN
  Administered 2023-09-09 (×2): 4 mg via INTRAVENOUS
  Filled 2023-09-06 (×2): qty 2

## 2023-09-06 MED ORDER — SODIUM ZIRCONIUM CYCLOSILICATE 10 G PO PACK
10.0000 g | PACK | Freq: Every day | ORAL | Status: DC
  Filled 2023-09-06: qty 1

## 2023-09-06 MED ORDER — CALCIUM GLUCONATE-NACL 1-0.675 GM/50ML-% IV SOLN
1.0000 g | Freq: Once | INTRAVENOUS | Status: AC
  Administered 2023-09-06: 1000 mg via INTRAVENOUS
  Filled 2023-09-06: qty 50

## 2023-09-06 MED ORDER — LACTATED RINGERS IV BOLUS (SEPSIS)
1000.0000 mL | Freq: Once | INTRAVENOUS | Status: AC
  Administered 2023-09-06: 1000 mL via INTRAVENOUS

## 2023-09-06 MED ORDER — METRONIDAZOLE 500 MG/100ML IV SOLN
500.0000 mg | Freq: Once | INTRAVENOUS | Status: AC
  Administered 2023-09-06: 500 mg via INTRAVENOUS
  Filled 2023-09-06: qty 100

## 2023-09-06 MED ORDER — LEVOTHYROXINE SODIUM 88 MCG PO TABS
88.0000 ug | ORAL_TABLET | Freq: Every day | ORAL | Status: DC
  Administered 2023-09-07 – 2023-09-10 (×4): 88 ug via ORAL
  Filled 2023-09-06 (×4): qty 1

## 2023-09-06 MED ORDER — MAGNESIUM OXIDE -MG SUPPLEMENT 400 (240 MG) MG PO TABS
400.0000 mg | ORAL_TABLET | ORAL | Status: DC
  Administered 2023-09-07 – 2023-09-09 (×2): 400 mg via ORAL
  Filled 2023-09-06 (×2): qty 1

## 2023-09-06 MED ORDER — SODIUM CHLORIDE 0.9 % IV BOLUS
500.0000 mL | Freq: Once | INTRAVENOUS | Status: AC
  Administered 2023-09-06: 500 mL via INTRAVENOUS

## 2023-09-06 MED ORDER — FLUOXETINE HCL 20 MG PO CAPS
20.0000 mg | ORAL_CAPSULE | Freq: Every day | ORAL | Status: DC
  Administered 2023-09-07 – 2023-09-10 (×4): 20 mg via ORAL
  Filled 2023-09-06 (×4): qty 1

## 2023-09-06 MED ORDER — LACTATED RINGERS IV BOLUS
1000.0000 mL | Freq: Once | INTRAVENOUS | Status: AC
  Administered 2023-09-06: 1000 mL via INTRAVENOUS

## 2023-09-06 MED ORDER — LACTATED RINGERS IV SOLN
150.0000 mL/h | INTRAVENOUS | Status: AC
  Administered 2023-09-06 – 2023-09-07 (×3): 150 mL/h via INTRAVENOUS

## 2023-09-06 MED ORDER — SODIUM CHLORIDE 0.9 % IV SOLN
1.0000 g | Freq: Once | INTRAVENOUS | Status: AC
  Administered 2023-09-06: 1 g via INTRAVENOUS
  Filled 2023-09-06: qty 20

## 2023-09-06 MED ORDER — INSULIN ASPART 100 UNIT/ML IV SOLN
10.0000 [IU] | Freq: Once | INTRAVENOUS | Status: AC
  Administered 2023-09-06: 10 [IU] via INTRAVENOUS

## 2023-09-06 MED ORDER — SODIUM BICARBONATE 8.4 % IV SOLN
50.0000 meq | Freq: Once | INTRAVENOUS | Status: AC
  Administered 2023-09-06: 50 meq via INTRAVENOUS
  Filled 2023-09-06: qty 50

## 2023-09-06 MED ORDER — ONDANSETRON HCL 4 MG PO TABS: 4.0000 mg | ORAL_TABLET | Freq: Four times a day (QID) | ORAL | Status: DC | PRN

## 2023-09-06 MED ORDER — CHLORHEXIDINE GLUCONATE CLOTH 2 % EX PADS
6.0000 | MEDICATED_PAD | Freq: Every day | CUTANEOUS | Status: DC
  Administered 2023-09-06 – 2023-09-10 (×5): 6 via TOPICAL

## 2023-09-06 MED ORDER — ATORVASTATIN CALCIUM 40 MG PO TABS
80.0000 mg | ORAL_TABLET | Freq: Every day | ORAL | Status: DC
  Administered 2023-09-07 – 2023-09-09 (×3): 80 mg via ORAL
  Filled 2023-09-06 (×3): qty 2

## 2023-09-06 MED ORDER — TIOTROPIUM BROMIDE MONOHYDRATE 2.5 MCG/ACT IN AERS
2.0000 | INHALATION_SPRAY | Freq: Every morning | RESPIRATORY_TRACT | Status: DC
Start: 2023-09-07 — End: 2023-09-06

## 2023-09-06 MED ORDER — PANTOPRAZOLE SODIUM 40 MG PO TBEC
40.0000 mg | DELAYED_RELEASE_TABLET | Freq: Every day | ORAL | Status: DC
  Administered 2023-09-07 – 2023-09-10 (×4): 40 mg via ORAL
  Filled 2023-09-06 (×4): qty 1

## 2023-09-06 MED ORDER — ORAL CARE MOUTH RINSE
15.0000 mL | OROMUCOSAL | Status: DC
  Administered 2023-09-06 – 2023-09-10 (×11): 15 mL via OROMUCOSAL

## 2023-09-06 MED ORDER — MOMETASONE FURO-FORMOTEROL FUM 100-5 MCG/ACT IN AERO
2.0000 | INHALATION_SPRAY | Freq: Two times a day (BID) | RESPIRATORY_TRACT | Status: DC
  Administered 2023-09-07 – 2023-09-10 (×7): 2 via RESPIRATORY_TRACT
  Filled 2023-09-06: qty 8.8

## 2023-09-06 MED ORDER — SODIUM POLYSTYRENE SULFONATE 15 GM/60ML CO SUSP
30.0000 g | Freq: Once | Status: AC
  Administered 2023-09-06: 30 g via RECTAL
  Filled 2023-09-06: qty 120

## 2023-09-06 MED ORDER — UMECLIDINIUM BROMIDE 62.5 MCG/ACT IN AEPB
1.0000 | INHALATION_SPRAY | Freq: Every day | RESPIRATORY_TRACT | Status: DC
  Administered 2023-09-07 – 2023-09-10 (×4): 1 via RESPIRATORY_TRACT
  Filled 2023-09-06: qty 7

## 2023-09-06 MED ORDER — ACETAMINOPHEN 650 MG RE SUPP: 650.0000 mg | Freq: Four times a day (QID) | RECTAL | Status: DC | PRN

## 2023-09-06 MED ORDER — ALBUTEROL SULFATE (2.5 MG/3ML) 0.083% IN NEBU
2.5000 mg | INHALATION_SOLUTION | Freq: Four times a day (QID) | RESPIRATORY_TRACT | Status: DC
  Administered 2023-09-06 – 2023-09-07 (×3): 2.5 mg via RESPIRATORY_TRACT
  Filled 2023-09-06 (×3): qty 3

## 2023-09-06 MED ORDER — SODIUM CHLORIDE 0.9 % IV SOLN
2.0000 g | Freq: Once | INTRAVENOUS | Status: AC
  Administered 2023-09-06: 2 g via INTRAVENOUS
  Filled 2023-09-06: qty 12.5

## 2023-09-06 MED ORDER — ALBUTEROL SULFATE (2.5 MG/3ML) 0.083% IN NEBU: 2.5000 mg | INHALATION_SOLUTION | RESPIRATORY_TRACT | Status: DC | PRN

## 2023-09-06 MED ORDER — INSULIN ASPART 100 UNIT/ML IV SOLN
5.0000 [IU] | Freq: Once | INTRAVENOUS | Status: AC
Start: 2023-09-06 — End: 2023-09-06
  Administered 2023-09-06: 5 [IU] via INTRAVENOUS
  Filled 2023-09-06: qty 0.05

## 2023-09-06 MED ORDER — EZETIMIBE 10 MG PO TABS
10.0000 mg | ORAL_TABLET | Freq: Every day | ORAL | Status: DC
  Administered 2023-09-07 – 2023-09-09 (×3): 10 mg via ORAL
  Filled 2023-09-06 (×3): qty 1

## 2023-09-06 MED ORDER — VANCOMYCIN HCL 2000 MG/400ML IV SOLN
2000.0000 mg | Freq: Once | INTRAVENOUS | Status: AC
  Administered 2023-09-06: 2000 mg via INTRAVENOUS
  Filled 2023-09-06: qty 400

## 2023-09-06 MED ORDER — ASPIRIN 81 MG PO TBEC
81.0000 mg | DELAYED_RELEASE_TABLET | Freq: Every day | ORAL | Status: DC
  Administered 2023-09-07 – 2023-09-10 (×4): 81 mg via ORAL
  Filled 2023-09-06 (×4): qty 1

## 2023-09-06 MED ORDER — CALCIUM GLUCONATE-NACL 1-0.675 GM/50ML-% IV SOLN
1.0000 g | INTRAVENOUS | Status: AC
  Administered 2023-09-06: 1000 mg via INTRAVENOUS
  Filled 2023-09-06: qty 50

## 2023-09-06 MED ORDER — ORAL CARE MOUTH RINSE: 15.0000 mL | OROMUCOSAL | Status: DC | PRN

## 2023-09-06 MED ORDER — ACETAMINOPHEN 500 MG PO TABS
1000.0000 mg | ORAL_TABLET | Freq: Once | ORAL | Status: DC
  Filled 2023-09-06: qty 2

## 2023-09-06 MED ORDER — INSULIN ASPART 100 UNIT/ML IJ SOLN
0.0000 [IU] | Freq: Three times a day (TID) | INTRAMUSCULAR | Status: DC
  Administered 2023-09-06: 8 [IU] via SUBCUTANEOUS
  Administered 2023-09-07 (×2): 3 [IU] via SUBCUTANEOUS
  Administered 2023-09-07: 2 [IU] via SUBCUTANEOUS
  Administered 2023-09-08 (×2): 5 [IU] via SUBCUTANEOUS
  Administered 2023-09-08: 10 [IU] via SUBCUTANEOUS
  Administered 2023-09-09: 2 [IU] via SUBCUTANEOUS
  Administered 2023-09-09: 3 [IU] via SUBCUTANEOUS
  Administered 2023-09-09: 5 [IU] via SUBCUTANEOUS
  Filled 2023-09-06: qty 0.15

## 2023-09-06 MED ORDER — CALCIUM GLUCONATE 10 % IV SOLN: 1.0000 g | Freq: Once | INTRAVENOUS | Status: DC

## 2023-09-06 MED ORDER — ALBUTEROL SULFATE (2.5 MG/3ML) 0.083% IN NEBU
10.0000 mg | INHALATION_SOLUTION | Freq: Once | RESPIRATORY_TRACT | Status: AC
  Administered 2023-09-06: 10 mg via RESPIRATORY_TRACT
  Filled 2023-09-06: qty 12

## 2023-09-06 NOTE — ED Provider Notes (Signed)
[facility] EMERGENCY DEPARTMENT AT [facility] Provider Note   CSN: [id number] Arrival date & time: 09/06/23  1119     History  Chief Complaint  Patient presents with   Altered Mental Status   Hyperglycemia    Karla Hoover is a 52 y.o. female.  Patient sent from [place] for altered mental status this morning. Per report from staff, she took her medications last night. This morning was found by staff lethargic, confused. No report of vomiting, recent illness/fever, diarrhea.   Per nursing home staff when I called:  Found this morning unresponsive, O2 saturation 70%. Not on oxygen regularly. They used a NRB but O2 saturation did not improve above 86%. She responded to pain but did not wake to baseline. Staff reports she is usually very independent, and high functioning. Confirms she is on Eliquis with h/o PE. No recent cough, fever, chills, illness, vomiting, diarrhea.   The history is provided by the patient. No language interpreter was used.  Altered Mental Status Hyperglycemia Associated symptoms: altered mental status        Home Medications Prior to Admission medications   Medication Sig Start Date End Date Taking? Authorizing Provider  acetaminophen (TYLENOL) 500 MG tablet Take 1,000 mg by mouth in the morning, at noon, and at bedtime.   Yes [provider]  albuterol (VENTOLIN HFA) 108 (90 Base) MCG/ACT inhaler Inhale 2 puffs into the lungs every 6 (six) hours as needed for wheezing or shortness of breath. 10/04/19  Yes Grayce Sessions, NP  apixaban (ELIQUIS) 5 MG TABS tablet Take 1 tablet (5mg ) twice daily Patient taking differently: Take 5 mg by mouth 2 (two) times daily. 09/10/22  Yes Jerald Kief, MD  aspirin 81 MG chewable tablet Chew 81 mg by mouth in the morning.   Yes [provider]  atenolol (TENORMIN) 25 MG tablet Take 50 mg by mouth 2 (two) times daily.   Yes [provider]  atorvastatin (LIPITOR) 80 MG  tablet Take 1 tablet (80 mg total) by mouth daily. Patient taking differently: Take 80 mg by mouth at bedtime. 10/04/19  Yes Grayce Sessions, NP  B Complex-Biotin-FA TABS Take 1 tablet by mouth in the morning.   Yes [provider]  chlorhexidine (PERIDEX) 0.12 % solution Use as directed 15 mLs in the mouth or throat every morning. Rinse and spit after oral care. Do not swallow.   Yes [provider]  clonazePAM (KLONOPIN) 0.5 MG tablet Take 0.5 mg by mouth 2 (two) times daily.   Yes [provider]  cyclobenzaprine (FLEXERIL) 10 MG tablet Take 1 tablet (10 mg total) by mouth 3 (three) times daily as needed for muscle spasms. Patient taking differently: Take 10 mg by mouth in the morning and at bedtime. 10/04/19  Yes Grayce Sessions, NP  Dulaglutide (TRULICITY) 0.75 MG/0.5ML SOPN Inject 0.75 mg into the skin every Friday.   Yes [provider]  EPINEPHrinesnap 1 MG/ML KIT Inject 1 mg as directed as needed (allergic reaction).   Yes [provider]  ezetimibe (ZETIA) 10 MG tablet Take 10 mg by mouth at bedtime.   Yes [provider]  FLUoxetine (PROZAC) 20 MG capsule Take 20 mg by mouth daily.   Yes [provider]  gabapentin (NEURONTIN) 400 MG capsule Take 1 capsule (400 mg total) by mouth 3 (three) times daily. 09/10/22  Yes Jerald Kief, MD  hydrALAZINE (APRESOLINE) 10 MG tablet Take 10 mg by mouth every  12 (twelve) hours as needed (BP>160 or dBP >95).   Yes [provider]  insulin glargine (LANTUS) 100 UNIT/ML injection Inject 0.1 mLs (10 Units total) into the skin 2 (two) times daily. (discard vial 28 days after first use) Patient taking differently: Inject 38 Units into the skin 2 (two) times daily. 12/18/22  Yes Jonah Blue, MD  insulin lispro (HUMALOG) 100 UNIT/ML injection Inject 0-14 Units into the skin See admin instructions. Inject 0-14 units twice daily as per sliding scale : CBG 0-200 : 0 units CBG  201-250 : 2 units CBG 251-300 : 4 units CBG 301-350 : 6 units CBG 351-400 : 8 units CBG 401-450 : 10 units CBG 451-500 : 12 units (if BP reads high, inject 14 units)   Yes [provider]  LACTOBACILLUS PO Take 1 capsule by mouth daily.   Yes [provider]  lansoprazole (PREVACID) 30 MG capsule Take 30 mg by mouth every morning.   Yes [provider]  levothyroxine (SYNTHROID) 88 MCG tablet Take 88 mcg by mouth daily before breakfast.   Yes [provider]  losartan (COZAAR) 25 MG tablet Take 37.5 mg by mouth daily.   Yes [provider]  magnesium oxide (MAG-OX) 400 (240 Mg) MG tablet Take 400 mg by mouth every other day.   Yes [provider]  Melatonin 5 MG TBDP Take 5 mg by mouth at bedtime.   Yes [provider]  Menthol, Topical Analgesic, 5 % GEL Apply 1 application  topically in the morning. To neck   Yes [provider]  nitrofurantoin, macrocrystal-monohydrate, (MACROBID) 100 MG capsule Take 100 mg by mouth at bedtime.   Yes [provider]  Omega-3 Fatty Acids (FISH OIL) 1000 MG CAPS Take 1,000 mg by mouth 3 (three) times daily.   Yes [provider]  ondansetron (ZOFRAN) 4 MG tablet Take 4 mg by mouth 2 (two) times daily.   Yes [provider]  OXYGEN Inhale 2 L/min into the lungs as needed (for shortness of breath).   Yes [provider]  Propyl Glycol-Hydroxyethylcell (NASAL MOIST) GEL Place 1 application  into both nostrils at bedtime.   Yes [provider]  QUEtiapine (SEROQUEL) 25 MG tablet Take 25 mg by mouth at bedtime.   Yes [provider]  rOPINIRole (REQUIP) 0.25 MG tablet Take 0.25 mg by mouth at bedtime.   Yes [provider]  Skin Protectants, Misc. (EUCERIN) cream Apply 1 Application topically daily as needed for dry skin.   Yes [provider]  SYMBICORT 80-4.5 MCG/ACT inhaler Inhale 2 puffs into the lungs 2 (two) times  daily.   Yes [provider]  Tiotropium Bromide Monohydrate (SPIRIVA RESPIMAT) 2.5 MCG/ACT AERS Inhale 2 puffs into the lungs in the morning.   Yes [provider]  witch hazel-glycerin (TUCKS) pad Apply 1 Application topically every 4 (four) hours as needed for hemorrhoids.   Yes [provider]  ergocalciferol (VITAMIN D2) 1.25 MG (50000 UT) capsule Take 50,000 Units by mouth every Thursday.    [provider]      Allergies    Cipro [ciprofloxacin hcl], Guaifenesin, Kiwi extract, Levaquin [levofloxacin], Strawberry extract, and Lioresal [baclofen]    Review of Systems   Review of Systems  Physical Exam Updated Vital Signs BP 107/70 (BP Location: Left Arm)   Pulse 87   Temp 99.5 F (37.5 C) (Oral)   Resp 13   LMP 10/20/2019   SpO2 99%  Physical  Exam Vitals and nursing note reviewed.  Constitutional:      Appearance: She is not toxic-appearing.     Comments: Lethargic  HENT:     Mouth/Throat:     Mouth: Mucous membranes are dry.  Cardiovascular:     Rate and Rhythm: Normal rate.     Heart sounds: No murmur heard. Pulmonary:     Effort: Pulmonary effort is normal.     Breath sounds: No wheezing, rhonchi or rales.     Comments: Decreased breath sound son lower left.  Abdominal:     Palpations: Abdomen is soft.     Comments: Obese. Not apparently tender.   Skin:    General: Skin is warm and dry.     Comments: Cool hands and feet, slightly diminished cap RF, 2-4 seconds. No color change.   Neurological:     GCS: GCS eye subscore is 3. GCS verbal subscore is 2. GCS motor subscore is 6.     ED Results / Procedures / Treatments   Labs (all labs ordered are listed, but only abnormal results are displayed) Labs Reviewed  CBC WITH DIFFERENTIAL/PLATELET - Abnormal; Notable for the following components:      Result Value   WBC 15.5 (*)    RBC 5.20 (*)    HCT 46.4 (*)    Neutro Abs 12.4 (*)    Abs Immature Granulocytes 0.09 (*)    All  other components within normal limits  COMPREHENSIVE METABOLIC PANEL - Abnormal; Notable for the following components:   Sodium 132 (*)    Potassium 6.2 (*)    Chloride 94 (*)    Glucose, Bld 401 (*)    BUN 43 (*)    Creatinine, Ser 3.22 (*)    Total Protein 9.0 (*)    AST 65 (*)    GFR, Estimated 17 (*)    All other components within normal limits  URINALYSIS, ROUTINE W REFLEX MICROSCOPIC - Abnormal; Notable for the following components:   Color, Urine AMBER (*)    APPearance CLOUDY (*)    Glucose, UA >=500 (*)    Hgb urine dipstick LARGE (*)    Protein, ur 100 (*)    Leukocytes,Ua LARGE (*)    Bacteria, UA MANY (*)    All other components within normal limits  BLOOD GAS, VENOUS - Abnormal; Notable for the following components:   pH, Ven 7.24 (*)    pCO2, Ven 69 (*)    pO2, Ven 48 (*)    Bicarbonate 29.6 (*)    All other components within normal limits  POTASSIUM - Abnormal; Notable for the following components:   Potassium 7.0 (*)    All other components within normal limits  CBG MONITORING, ED - Abnormal; Notable for the following components:   Glucose-Capillary 399 (*)    All other components within normal limits  I-STAT CG4 LACTIC ACID, ED - Abnormal; Notable for the following components:   Lactic Acid, Venous 2.1 (*)    All other components within normal limits  RESP PANEL BY RT-PCR (RSV, FLU A&B, COVID)  [id number]  URINE CULTURE  CULTURE, BLOOD (ROUTINE X 2)  CULTURE, BLOOD (ROUTINE X 2)  LIPASE, BLOOD  I-STAT CG4 LACTIC ACID, ED  TROPONIN I (HIGH SENSITIVITY)   Results for orders placed or performed during the hospital encounter of 09/06/23  Resp panel by RT-PCR (RSV, Flu A&B, Covid) Anterior Nasal Swab   Specimen: Anterior Nasal Swab  Result Value Ref Range   SARS Coronavirus  2 by RT PCR NEGATIVE NEGATIVE   Influenza A by PCR NEGATIVE NEGATIVE   Influenza B by PCR NEGATIVE NEGATIVE   Resp Syncytial Virus by PCR NEGATIVE NEGATIVE  CBC with Differential   Result Value Ref Range   WBC 15.5 (H) 4.0 - 10.5 K/uL   RBC 5.20 (H) 3.87 - 5.11 MIL/uL   Hemoglobin 14.2 12.0 - 15.0 g/dL   HCT 10.2 (H) 72.5 - 36.6 %   MCV 89.2 80.0 - 100.0 fL   MCH 27.3 26.0 - 34.0 pg   MCHC 30.6 30.0 - 36.0 g/dL   RDW 44.0 34.7 - 42.5 %   Platelets 306 150 - 400 K/uL   nRBC 0.0 0.0 - 0.2 %   Neutrophils Relative % 80 %   Neutro Abs 12.4 (H) 1.7 - 7.7 K/uL   Lymphocytes Relative 15 %   Lymphs Abs 2.3 0.7 - 4.0 K/uL   Monocytes Relative 4 %   Monocytes Absolute 0.6 0.1 - 1.0 K/uL   Eosinophils Relative 0 %   Eosinophils Absolute 0.0 0.0 - 0.5 K/uL   Basophils Relative 0 %   Basophils Absolute 0.0 0.0 - 0.1 K/uL   Immature Granulocytes 1 %   Abs Immature Granulocytes 0.09 (H) 0.00 - 0.07 K/uL  Comprehensive metabolic panel  Result Value Ref Range   Sodium 132 (L) 135 - 145 mmol/L   Potassium 6.2 (H) 3.5 - 5.1 mmol/L   Chloride 94 (L) 98 - 111 mmol/L   CO2 27 22 - 32 mmol/L   Glucose, Bld 401 (H) 70 - 99 mg/dL   BUN 43 (H) 6 - 20 mg/dL   Creatinine, Ser 9.56 (H) 0.44 - 1.00 mg/dL   Calcium 9.4 8.9 - 38.7 mg/dL   Total Protein 9.0 (H) 6.5 - 8.1 g/dL   Albumin 4.1 3.5 - 5.0 g/dL   AST 65 (H) 15 - 41 U/L   ALT 25 0 - 44 U/L   Alkaline Phosphatase 85 38 - 126 U/L   Total Bilirubin 0.6 <1.2 mg/dL   GFR, Estimated 17 (L) >60 mL/min   Anion gap 11 5 - 15  Urinalysis, Routine w reflex microscopic -Urine, Catheterized  Result Value Ref Range   Color, Urine AMBER (A) YELLOW   APPearance CLOUDY (A) CLEAR   Specific Gravity, Urine 1.018 1.005 - 1.030   pH 6.0 5.0 - 8.0   Glucose, UA >=500 (A) NEGATIVE mg/dL   Hgb urine dipstick LARGE (A) NEGATIVE   Bilirubin Urine NEGATIVE NEGATIVE   Ketones, ur NEGATIVE NEGATIVE mg/dL   Protein, ur 564 (A) NEGATIVE mg/dL   Nitrite NEGATIVE NEGATIVE   Leukocytes,Ua LARGE (A) NEGATIVE   RBC / HPF >50 0 - 5 RBC/hpf   WBC, UA >50 0 - 5 WBC/hpf   Bacteria, UA MANY (A) NONE SEEN   Squamous Epithelial / HPF 6-10 0 - 5 /HPF    WBC Clumps PRESENT    Mucus PRESENT   Blood gas, venous  Result Value Ref Range   pH, Ven 7.24 (L) 7.25 - 7.43   pCO2, Ven 69 (H) 44 - 60 mmHg   pO2, Ven 48 (H) 32 - 45 mmHg   Bicarbonate 29.6 (H) 20.0 - 28.0 mmol/L   Acid-Base Excess 0.4 0.0 - 2.0 mmol/L   O2 Saturation 77.7 %   Patient temperature 37.0   Lipase, blood  Result Value Ref Range   Lipase 27 11 - 51 U/L  Potassium  Result Value Ref Range   Potassium  7.0 (HH) 3.5 - 5.1 mmol/L  CBG monitoring, ED  Result Value Ref Range   Glucose-Capillary 399 (H) 70 - 99 mg/dL  I-Stat Lactic Acid  Result Value Ref Range   Lactic Acid, Venous 2.1 (HH) 0.5 - 1.9 mmol/L   Comment NOTIFIED PHYSICIAN   Troponin I (High Sensitivity)  Result Value Ref Range   Troponin I (High Sensitivity) 6 <18 ng/L    EKG None  Radiology DG Chest Portable 1 View  Result Date: 09/06/2023 CLINICAL DATA:  Altered mental status and hyperglycemia EXAM: PORTABLE CHEST 1 VIEW COMPARISON:  Chest radiograph dated 01/21/2023 FINDINGS: Normal lung volumes. Left basilar linear opacities. No pleural effusion or pneumothorax. Enlarged cardiomediastinal silhouette is likely projectional. No acute osseous abnormality. IMPRESSION: Left basilar linear opacities, likely atelectasis. Electronically Signed   By: Agustin Cree M.D.   On: 09/06/2023 12:24   CT Head Wo Contrast  Result Date: 09/06/2023 CLINICAL DATA:  Mental status change with unknown cause. EXAM: CT HEAD WITHOUT CONTRAST TECHNIQUE: Contiguous axial images were obtained from the base of the skull through the vertex without intravenous contrast. RADIATION DOSE REDUCTION: This exam was performed according to the departmental dose-optimization program which includes automated exposure control, adjustment of the mA and/or kV according to patient size and/or use of iterative reconstruction technique. COMPARISON:  12/13/2022 FINDINGS: Brain: No evidence of acute infarction, hemorrhage, hydrocephalus, extra-axial  collection or mass lesion/mass effect. Chronic perforator infarct at the right basal ganglia with patchy chronic infarcts along the right frontal parietal cortex. Wallerian degeneration seen into the brainstem. Vascular: No hyperdense vessel or unexpected calcification. Skull: Normal. Negative for fracture or focal lesion. Sinuses/Orbits: No acute finding. IMPRESSION: 1. No acute or reversible finding. 2. Remote right cerebral infarcts. Electronically Signed   By: Tiburcio Pea M.D.   On: 09/06/2023 12:17    Procedures .Critical Care  Performed by: Elpidio Anis, PA-C Authorized by: Elpidio Anis, PA-C   Critical care provider statement:    Critical care time (minutes):  40   Critical care was necessary to treat or prevent imminent or life-threatening deterioration of the following conditions:  Sepsis, respiratory failure and CNS failure or compromise   Critical care was time spent personally by me on the following activities:  Discussions with consultants, evaluation of patient's response to treatment, examination of patient, obtaining history from patient or surrogate, ordering and review of laboratory studies, ordering and review of radiographic studies, pulse oximetry, re-evaluation of patient's condition and review of old charts     Medications Ordered in ED Medications  vancomycin (VANCOREADY) IVPB 2000 mg/400 mL (has no administration in time range)  sodium zirconium cyclosilicate (LOKELMA) packet 10 g (has no administration in time range)  calcium gluconate 1 g/ 50 mL sodium chloride IVPB (1,000 mg Intravenous New Bag/Given 09/06/23 1443)  acetaminophen (TYLENOL) tablet 1,000 mg (has no administration in time range)  sodium chloride 0.9 % bolus 500 mL (0 mLs Intravenous Stopped 09/06/23 1344)  ceFEPIme (MAXIPIME) 2 g in sodium chloride 0.9 % 100 mL IVPB (0 g Intravenous Stopped 09/06/23 1344)  metroNIDAZOLE (FLAGYL) IVPB 500 mg (0 mg Intravenous Stopped 09/06/23 1452)  lactated  ringers bolus 1,000 mL (0 mLs Intravenous Stopped 09/06/23 1453)  albuterol (PROVENTIL) (2.5 MG/3ML) 0.083% nebulizer solution 10 mg (10 mg Nebulization Given 09/06/23 1435)  insulin aspart (novoLOG) injection 5 Units (5 Units Intravenous Given 09/06/23 1429)    ED Course/ Medical Decision Making/ A&P Clinical Course as of 09/06/23 1502  Sat Sep 06, 2023  1321 Labs significant for pH 2.24 - IV fluids bolusing. Potassium 6.1 - being repeated for confirmation. Lactic acid 2.1.  [SU]    Clinical Course User Index [SU] Elpidio Anis, PA-C                                 Medical Decision Making This patient presents to the ED for concern of AMS, this involves an extensive number of treatment options, and is a complaint that carries with it a high risk of complications and morbidity.  The differential diagnosis includes stroke, sepsis, bleed, encephalopathy   Co morbidities that complicate the patient evaluation  Previous CVA w/residual left hemiparesis, HLD, mental delay, PE on Eliquis, T2DM, non-O2 dependent COPD, CKD, hypothyroidism   Additional history obtained:  Additional history and/or information obtained from chart review, notable for Mother at bedside; Staff at [facility] by phone   Lab Tests:  I Ordered, and personally interpreted labs.  The pertinent results include:  pH 2.24; Lactic acid 2.1; cl 94; potassium 6.1 (being repeated to verify); WBC 15.5; normal hemoglobin; Cr 3.22 (c/w new AKI); UA - Many bacteria with clumps, >50 WBC, >50 RBC   Imaging Studies ordered:  I ordered imaging studies including Head CT Radiologist interpretation - no acute findings I ordered imaging studies including CXR - Per radiologist interpretation: LLL atx - no consolidation or pneumonia.    Cardiac Monitoring:  The patient was maintained on a cardiac monitor.  I personally viewed and interpreted the cardiac monitored which showed an underlying rhythm of: sinus rhythm, rate  89   Medicines ordered and prescription drug management:  I ordered medication including Maxipime, Vanc, Flagyl  for undifferentiated sepsis  Test Considered:  CT head - eval Stroke, bleed CXR - eval PNA, PTX, consolidations - hypoxia to 82% today Labs - for cause of AMS   Critical Interventions:  O2 - improved O2 sat in ED to 91% IV fluids - history of fluid overload, minimal edema today, small bolus ordered and will adjust Antibiotics started for undifferentiated sepsis Potassium confirmed at 7.0 - Ca+gluconate, albuterol, Lokelma, insulin provided Patient's mental status improving, still not at baseline AKI - baseline Cr 1.5, now 3.22 UA - c/w infection    Problem List / ED Course:  Altered mental status: found markedly hypoxic; sepsis, acidotic C/O chest pain - EKG without ischemic changes, troponin pending. AKI - baseline Cr 1.5, now 3.22 UTI - culture pending Blood cultures pending   Reevaluation:  After the interventions noted above, I reevaluated the patient and found that they have :  She is now responding verbally. Easy to awaken, still somnolent but improved mental status   Social Determinants of Health:  Nursing home facility H/O stroke with left hemiparesis Good family support   Disposition:  After consideration of the diagnostic results and the patients response to treatment, I feel that the patient would benefit from Admit for further ongoing management sepsis, AKI, residual mental status change, hyperkalemia. Discussed with Dr. Robb Matar who accepts the patient for admission.   Amount and/or Complexity of Data Reviewed Labs: ordered. Radiology: ordered.  Risk OTC drugs. Prescription drug management. Decision regarding hospitalization.           Final Clinical Impression(s) / ED Diagnoses Final diagnoses:  Sepsis with acute hypoxic respiratory failure without septic shock, due to unspecified organism (HCC)  AKI (acute kidney injury)  (HCC)  Acidosis  Hyperkalemia  Rx / DC Orders ED Discharge Orders     None         Elpidio Anis, PA-C 09/06/23 1502    Edwin Dada P, DO 09/12/23 2350

## 2023-09-06 NOTE — Progress Notes (Signed)
Pharmacy Antibiotic Note  Karla Hoover is a 52 y.o. female admitted on 09/06/2023 with UTI. Pt has previously grown ESBL+ E.coli in urine culture in April 2024.  Pharmacy has been consulted for meropenem dosing.  Today, 09/06/23 AKI - SCr 3.22. Baseline appears to be ~1.2 Bed weight reported as 98.9 kg by RN. Will use adjusted body weight of 75 kg for calculation of CrCl = 25 mL/min  Plan: Meropenem 1 g IV q12h Monitor renal function     Temp (24hrs), Avg:98.9 F (37.2 C), Min:98.2 F (36.8 C), Max:99.5 F (37.5 C)  Recent Labs  Lab 09/06/23 1158 09/06/23 1210  WBC 15.5*  --   CREATININE 3.22*  --   LATICACIDVEN  --  2.1*    CrCl cannot be calculated (Unknown ideal weight.).    Allergies  Allergen Reactions   Cipro [Ciprofloxacin Hcl] Hives   Guaifenesin Anaphylaxis   Kiwi Extract Anaphylaxis and Rash   Levaquin [Levofloxacin] Shortness Of Breath and Rash   Strawberry Extract Anaphylaxis and Rash   Lioresal [Baclofen] Other (See Comments)    Near syncope/ fall    Antimicrobials this admission: meropenem 11/30 >>  Cefepime, vancomycin, metronidazole x1 in ED 11/30  Dose adjustments this admission:  Microbiology results: 11/30 BCx:  11/30 UCx:    Cindi Carbon, PharmD 09/06/2023 4:02 PM

## 2023-09-06 NOTE — Plan of Care (Signed)
Discussed with family plan of care for the evening, pain management and admission questions with some teach back displayed.  Problem: Education: Goal: Knowledge of General Education information will improve Description: Including pain rating scale, medication(s)/side effects and non-pharmacologic comfort measures Outcome: Not Progressing   Problem: Health Behavior/Discharge Planning: Goal: Ability to manage health-related needs will improve Outcome: Not Progressing

## 2023-09-06 NOTE — ED Triage Notes (Incomplete)
Pt BIB EMS coming from [facility] c/o AMS, and hyperglycemia. [employer]. Upon arrival pt Spo2 82% at room put in [place] 6L. Hx DM, CHF Copd,   BP 106/44, 77% on room air, then 97% on [place] 6L, Cbg 456,

## 2023-09-06 NOTE — ED Notes (Signed)
Troponin being added to sample sent earlier

## 2023-09-06 NOTE — ED Notes (Signed)
ED TO INPATIENT HANDOFF REPORT  ED Nurse Name and Phone #: Shanetha Bradham rn  S Name/Age/Gender Karla Hoover 52 y.o. female Room/Bed: WA15/WA15  Code Status   Code Status: Full Code  Home/SNF/Other Skilled nursing facility  Is this baseline?   Triage Complete: Triage complete  Chief Complaint UTI due to extended-spectrum beta lactamase (ESBL) producing Escherichia coli [N39.0, B96.29, Z16.12]  Triage Note No notes on file   Allergies Allergies  Allergen Reactions   Cipro [Ciprofloxacin Hcl] Hives   Guaifenesin Anaphylaxis   Kiwi Extract Anaphylaxis and Rash   Levaquin [Levofloxacin] Shortness Of Breath and Rash   Strawberry Extract Anaphylaxis and Rash   Lioresal [Baclofen] Other (See Comments)    Near syncope/ fall    Level of Care/Admitting Diagnosis ED Disposition     ED Disposition  Admit   Condition  --   Comment  Hospital Area: [facility]  HOSPITAL [[id number]]  Level of Care: Stepdown [14]  Admit to SDU based on following criteria: Respiratory Distress:  Frequent assessment and/or intervention to maintain adequate ventilation/respiration, pulmonary toilet, and respiratory treatment.  Admit to SDU based on following criteria: Severe physiological/psychological symptoms:  Any diagnosis requiring assessment & intervention at least every 4 hours on an ongoing basis to obtain desired patient outcomes including stability and rehabilitation  May admit patient to [facility] or [facility] if equivalent level of care is available:: No  Covid Evaluation: Asymptomatic - no recent exposure (last 10 days) testing not required  Diagnosis: UTI due to extended-spectrum beta lactamase (ESBL) producing Escherichia coli [[id number]]  Admitting Physician: Bobette Mo [[id number]]  Attending Physician: Bobette Mo [[id number]]  Certification:: I certify this patient will need inpatient services for at least 2 midnights  Expected Medical Readiness:  09/09/2023          B Medical/Surgery History Past Medical History:  Diagnosis Date   Asthma    Bilateral pulmonary embolism (HCC) 11/15/2019   Chronic kidney disease, stage 3a (HCC) 02/09/2021   Chronic respiratory failure with hypoxia (HCC) 02/09/2021   COPD (chronic obstructive pulmonary disease) (HCC)    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 No pertinent surgical history.   A IV Location/Drains/Wounds Patient Lines/Drains/Airways Status     Active Line/Drains/Airways     Name Placement date Placement time Site Days   Peripheral IV 09/06/23 20 G Right Antecubital 09/06/23  1138  Antecubital  less than 1   Peripheral IV 09/06/23 20 G Anterior;Right Forearm 09/06/23  1422  Forearm  less than 1            Intake/Output Last 24 hours  Intake/Output Summary (Last 24 hours) at 09/06/2023 1541 Last data filed at 09/06/2023 1453 Gross per 24 hour  Intake 1696.75 ml  Output --  Net 1696.75 ml    Labs/Imaging Results for orders placed or performed during the hospital encounter of 09/06/23 (  from the past 48 hour(s))  CBG monitoring, ED     Status: Abnormal   Collection Time: 09/06/23 11:25 AM  Result Value Ref Range   Glucose-Capillary 399 (H) 70 - 99 mg/dL    Comment: Glucose reference range applies only to samples taken after fasting for at least 8 hours.  CBC with  Differential     Status: Abnormal   Collection Time: 09/06/23 11:58 AM  Result Value Ref Range   WBC 15.5 (H) 4.0 - 10.5 K/uL   RBC 5.20 (H) 3.87 - 5.11 MIL/uL   Hemoglobin 14.2 12.0 - 15.0 g/dL   HCT 40.1 (H) 02.7 - 25.3 %   MCV 89.2 80.0 - 100.0 fL   MCH 27.3 26.0 - 34.0 pg   MCHC 30.6 30.0 - 36.0 g/dL   RDW 66.4 40.3 - 47.4 %   Platelets 306 150 - 400 K/uL   nRBC 0.0 0.0 - 0.2 %   Neutrophils Relative % 80 %   Neutro Abs 12.4 (H) 1.7 - 7.7 K/uL   Lymphocytes Relative 15 %   Lymphs Abs 2.3 0.7 - 4.0 K/uL   Monocytes Relative 4 %   Monocytes Absolute 0.6 0.1 - 1.0 K/uL   Eosinophils Relative 0 %   Eosinophils Absolute 0.0 0.0 - 0.5 K/uL   Basophils Relative 0 %   Basophils Absolute 0.0 0.0 - 0.1 K/uL   Immature Granulocytes 1 %   Abs Immature Granulocytes 0.09 (H) 0.00 - 0.07 K/uL    Comment: Performed at [facility], [address] [place], [place] [address]  Comprehensive metabolic panel     Status: Abnormal   Collection Time: 09/06/23 11:58 AM  Result Value Ref Range   Sodium 132 (L) 135 - 145 mmol/L   Potassium 6.2 (H) 3.5 - 5.1 mmol/L   Chloride 94 (L) 98 - 111 mmol/L   CO2 27 22 - 32 mmol/L   Glucose, Bld 401 (H) 70 - 99 mg/dL    Comment: Glucose reference range applies only to samples taken after fasting for at least 8 hours.   BUN 43 (H) 6 - 20 mg/dL   Creatinine, Ser 3.87 (H) 0.44 - 1.00 mg/dL   Calcium 9.4 8.9 - 56.4 mg/dL   Total Protein 9.0 (H) 6.5 - 8.1 g/dL   Albumin 4.1 3.5 - 5.0 g/dL   AST 65 (H) 15 - 41 U/L   ALT 25 0 - 44 U/L   Alkaline Phosphatase 85 38 - 126 U/L   Total Bilirubin 0.6 <1.2 mg/dL   GFR, Estimated 17 (L) >60 mL/min    Comment: (NOTE) Calculated using the CKD-EPI Creatinine Equation (2021)    Anion gap 11 5 - 15    Comment: Performed at [facility], [address] [place], [place] [address]  Blood gas, venous     Status: Abnormal   Collection Time: 09/06/23 11:58 AM  Result Value Ref Range    pH, Ven 7.24 (L) 7.25 - 7.43   pCO2, Ven 69 (H) 44 - 60 mmHg   pO2, Ven 48 (H) 32 - 45 mmHg   Bicarbonate 29.6 (H) 20.0 - 28.0 mmol/L   Acid-Base Excess 0.4 0.0 - 2.0 mmol/L   O2 Saturation 77.7 %   Patient temperature 37.0     Comment: Performed at [facility], [address] [place], [place] [address]  Lipase, blood     Status: None   Collection Time: 09/06/23 11:58 AM  Result Value  Ref Range   Lipase 27 11 - 51 U/L    Comment: Performed at [facility], [address], [place], [place] [address]  I-Stat Lactic Acid     Status: Abnormal   Collection Time: 09/06/23 12:10 PM  Result Value Ref Range   Lactic Acid, Venous 2.1 (HH) 0.5 - 1.9 mmol/L   Comment NOTIFIED PHYSICIAN   Resp panel by RT-PCR (RSV, Flu A&B, Covid) Anterior Nasal Swab     Status: None   Collection Time: 09/06/23 12:17 PM   Specimen: Anterior Nasal Swab  Result Value Ref Range   SARS Coronavirus 2 by RT PCR NEGATIVE NEGATIVE    Comment: (NOTE) SARS-CoV-2 target nucleic acids are NOT DETECTED.  The SARS-CoV-2 RNA is generally detectable in upper respiratory specimens during the acute phase of infection. The lowest concentration of SARS-CoV-2 viral copies this assay can detect is 138 copies/mL. A negative result does not preclude SARS-Cov-2 infection and should not be used as the sole basis for treatment or other patient management decisions. A negative result may occur with  improper specimen collection/handling, submission of specimen other than nasopharyngeal swab, presence of viral mutation(s) within the areas targeted by this assay, and inadequate number of viral copies(<138 copies/mL). A negative result must be combined with clinical observations, patient history, and epidemiological information. The expected result is Negative.  Fact Sheet for Patients:  BloggerCourse.com  Fact Sheet for Healthcare Providers:   SeriousBroker.it  This test is no t yet approved or cleared by the [place] FDA and  has been authorized for detection and/or diagnosis of SARS-CoV-2 by FDA under an Emergency Use Authorization (EUA). This EUA will remain  in effect (meaning this test can be used) for the duration of the COVID-19 declaration under Section 564(b)(1) of the Act, 21 U.S.C.section 360bbb-3(b)(1), unless the authorization is terminated  or revoked sooner.       Influenza A by PCR NEGATIVE NEGATIVE   Influenza B by PCR NEGATIVE NEGATIVE    Comment: (NOTE) The Xpert Xpress SARS-CoV-2/FLU/RSV plus assay is intended as an aid in the diagnosis of influenza from Nasopharyngeal swab specimens and should not be used as a sole basis for treatment. Nasal washings and aspirates are unacceptable for Xpert Xpress SARS-CoV-2/FLU/RSV testing.  Fact Sheet for Patients: BloggerCourse.com  Fact Sheet for Healthcare Providers: SeriousBroker.it  This test is not yet approved or cleared by the [place] FDA and has been authorized for detection and/or diagnosis of SARS-CoV-2 by FDA under an Emergency Use Authorization (EUA). This EUA will remain in effect (meaning this test can be used) for the duration of the COVID-19 declaration under Section 564(b)(1) of the Act, 21 U.S.C. section 360bbb-3(b)(1), unless the authorization is terminated or revoked.     Resp Syncytial Virus by PCR NEGATIVE NEGATIVE    Comment: (NOTE) Fact Sheet for Patients: BloggerCourse.com  Fact Sheet for Healthcare Providers: SeriousBroker.it  This test is not yet approved or cleared by the [place] FDA and has been authorized for detection and/or diagnosis of SARS-CoV-2 by FDA under an Emergency Use Authorization (EUA). This EUA will remain in effect (meaning this test can be used) for the duration of  the COVID-19 declaration under Section 564(b)(1) of the Act, 21 U.S.C. section 360bbb-3(b)(1), unless the authorization is terminated or revoked.  Performed at [facility], [address] [place], [place] [address]   Urinalysis, Routine w reflex microscopic -Urine, Catheterized     Status: Abnormal   Collection Time: 09/06/23  12:24 PM  Result Value Ref Range   Color, Urine AMBER (A) YELLOW    Comment: BIOCHEMICALS MAY BE AFFECTED BY COLOR   APPearance CLOUDY (A) CLEAR   Specific Gravity, Urine 1.018 1.005 - 1.030   pH 6.0 5.0 - 8.0   Glucose, UA >=500 (A) NEGATIVE mg/dL   Hgb urine dipstick LARGE (A) NEGATIVE   Bilirubin Urine NEGATIVE NEGATIVE   Ketones, ur NEGATIVE NEGATIVE mg/dL   Protein, ur 409 (A) NEGATIVE mg/dL   Nitrite NEGATIVE NEGATIVE   Leukocytes,Ua LARGE (A) NEGATIVE   RBC / HPF >50 0 - 5 RBC/hpf   WBC, UA >50 0 - 5 WBC/hpf   Bacteria, UA MANY (A) NONE SEEN   Squamous Epithelial / HPF 6-10 0 - 5 /HPF   WBC Clumps PRESENT    Mucus PRESENT     Comment: Performed at [facility], [address], [place], [place] [address]  Potassium     Status: Abnormal   Collection Time: 09/06/23  1:13 PM  Result Value Ref Range   Potassium 7.0 (HH) 3.5 - 5.1 mmol/L    Comment: CRITICAL RESULT CALLED TO, READ BACK BY AND VERIFIED WITH NELSON,J RN @ 1352 09/06/23 BY CHILDRESS,E Performed at [facility], [address] [place], [place] [address]   Troponin I (High Sensitivity)     Status: None   Collection Time: 09/06/23  1:13 PM  Result Value Ref Range   Troponin I (High Sensitivity) 6 <18 ng/L    Comment: (NOTE) Elevated high sensitivity troponin I (hsTnI) values and significant  changes across serial measurements may suggest ACS but many other  chronic and acute conditions are known to elevate hsTnI results.  Refer to the "Links" section for chest pain algorithms and additional  guidance. Performed at [facility], [address], [place], [place] [address]   CBG monitoring, ED     Status: Abnormal   Collection Time: 09/06/23  3:07 PM  Result Value Ref Range   Glucose-Capillary 294 (H) 70 - 99 mg/dL    Comment: Glucose reference range applies only to samples taken after fasting for at least 8 hours.   DG Chest Portable 1 View  Result Date: 09/06/2023 CLINICAL DATA:  Altered mental status and hyperglycemia EXAM: PORTABLE CHEST 1 VIEW COMPARISON:  Chest radiograph dated 01/21/2023 FINDINGS: Normal lung volumes. Left basilar linear opacities. No pleural effusion or pneumothorax. Enlarged cardiomediastinal silhouette is likely projectional. No acute osseous abnormality. IMPRESSION: Left basilar linear opacities, likely atelectasis. Electronically Signed   By: Agustin Cree M.D.   On: 09/06/2023 12:24   CT Head Wo Contrast  Result Date: 09/06/2023 CLINICAL DATA:  Mental status change with unknown cause. EXAM: CT HEAD WITHOUT CONTRAST TECHNIQUE: Contiguous axial images were obtained from the base of the skull through the vertex without intravenous contrast. RADIATION DOSE REDUCTION: This exam was performed according to the departmental dose-optimization program which includes automated exposure control, adjustment of the mA and/or kV according to patient size and/or use of iterative reconstruction technique. COMPARISON:  12/13/2022 FINDINGS: Brain: No evidence of acute infarction, hemorrhage, hydrocephalus, extra-axial collection or mass lesion/mass effect. Chronic perforator infarct at the right basal ganglia with patchy chronic infarcts along the right frontal parietal cortex. Wallerian degeneration seen into the brainstem. Vascular: No hyperdense vessel or unexpected calcification. Skull: Normal. Negative for fracture or focal lesion. Sinuses/Orbits: No acute finding. IMPRESSION: 1. No acute or reversible finding. 2. Remote right cerebral infarcts. Electronically Signed  By: Tiburcio Pea M.D.    On: 09/06/2023 12:17    Pending Labs Unresulted Labs (From admission, onward)     Start     Ordered   09/07/23 0500  CBC with Differential  Tomorrow morning,   R        09/06/23 1527   09/07/23 0500  Comprehensive metabolic panel  Tomorrow morning,   R        09/06/23 1527   09/07/23 0500  HIV Antibody (routine testing w rflx)  (HIV Antibody (Routine testing w reflex) panel)  Tomorrow morning,   R        09/06/23 1529   09/06/23 1140  Urine Culture  Once,   URGENT       Question:  Indication  Answer:  Sepsis   09/06/23 1142   09/06/23 1140  Culture, blood (routine x 2)  BLOOD CULTURE X 2,   R      09/06/23 1142            Vitals/Pain Today's Vitals   09/06/23 1148 09/06/23 1400 09/06/23 1500 09/06/23 1537  BP:  107/70 (!) 90/55   Pulse:  87    Resp:  13 13   Temp:    98.2 F (36.8 C)  TempSrc:    Oral  SpO2:  99%    PainSc: 9        Isolation Precautions No active isolations  Medications Medications  vancomycin (VANCOREADY) IVPB 2000 mg/400 mL (2,000 mg Intravenous New Bag/Given 09/06/23 1505)  sodium zirconium cyclosilicate (LOKELMA) packet 10 g (10 g Oral Not Given 09/06/23 1515)  calcium gluconate 1 g/ 50 mL sodium chloride IVPB (1,000 mg Intravenous New Bag/Given 09/06/23 1443)  acetaminophen (TYLENOL) tablet 1,000 mg (1,000 mg Oral Not Given 09/06/23 1514)  meropenem (MERREM) 1 g in sodium chloride 0.9 % 100 mL IVPB (has no administration in time range)  lactated ringers infusion (has no administration in time range)  lactated ringers bolus 1,000 mL (1,000 mLs Intravenous New Bag/Given 09/06/23 1536)  acetaminophen (TYLENOL) tablet 650 mg (has no administration in time range)    Or  acetaminophen (TYLENOL) suppository 650 mg (has no administration in time range)  ondansetron (ZOFRAN) tablet 4 mg (has no administration in time range)    Or  ondansetron (ZOFRAN) injection 4 mg (has no administration in time range)  sodium chloride 0.9 % bolus 500 mL (0 mLs  Intravenous Stopped 09/06/23 1344)  ceFEPIme (MAXIPIME) 2 g in sodium chloride 0.9 % 100 mL IVPB (0 g Intravenous Stopped 09/06/23 1344)  metroNIDAZOLE (FLAGYL) IVPB 500 mg (0 mg Intravenous Stopped 09/06/23 1452)  lactated ringers bolus 1,000 mL (0 mLs Intravenous Stopped 09/06/23 1453)  albuterol (PROVENTIL) (2.5 MG/3ML) 0.083% nebulizer solution 10 mg (10 mg Nebulization Given 09/06/23 1435)  insulin aspart (novoLOG) injection 5 Units (5 Units Intravenous Given 09/06/23 1429)    Mobility non-ambulatory     Focused Assessments Cardiac Assessment Handoff:    Lab Results  Component Value Date   CKTOTAL 27 (L) 08/29/2022   CKMB 0.7 12/10/2012   TROPONINI 0.03 02/27/2015   Lab Results  Component Value Date   DDIMER 0.39 09/06/2022   Does the Patient currently have chest pain? No   , Neuro Assessment Handoff:  Swallow screen pass?      Last date known well: 09/05/23   Neuro Assessment:   Neuro Checks:      Has TPA been given? No If patient is a Neuro Trauma and patient is  going to OR before floor call report to 4N Charge nurse: [phone] or [phone]  , Renal Assessment Handoff:  Hemodialysis Schedule:  Last Hemodialysis date and time:    Restricted appendage:    , Pulmonary Assessment Handoff:  Lung sounds:   O2 Device: Nasal Cannula O2 Flow Rate (L/min): 5 L/min    R Recommendations: See Admitting Provider Note  Report given to:   Additional Notes: hello just this pt is getting admitted because of sepsis pt came to the ed from facility with AMS. Pt still altered and wont follow command I have fluid bolus going on right now and antibiotic Zenaida Niece going she got 2 iv access on her right arm. With history of stroke with left sided weakness. Thank you

## 2023-09-06 NOTE — H&P (Signed)
History and Physical    Patient: Karla Hoover HYQ:[id number] DOB: 29-Apr-1971 DOA: 09/06/2023 DOS: the patient was seen and examined on 09/06/2023 PCP: Pcp, No  Patient coming from: Home  Chief Complaint:  Chief Complaint  Patient presents with   Altered Mental Status   Hyperglycemia   HPI: Karla Hoover is a 52 y.o. female with medical history significant of asthma, bilateral pulmonary embolism, stage IIIa CKD, history of AKI episode, history of COPD, chronic respiratory failure with hypoxia occasionally on oxygen, type 2 diabetes, hypertension, GERD, grade 1 diastolic dysfunction, hypertension, hypothyroidism, hyperlipidemia, history of CVA with left-sided hemiparesis who is sent from her facility due to altered mental status and hyperglycemia.  Apparently, she was at her baseline yesterday.  She received a Trulicity injection yesterday.  She is unable to contribute any meaningful history at the moment, history is taken from her mother in the chart.  Lab work: Her urine analysis was amber, cloudy with greater than 500 glucose and protein, 100 mg/dL.  There was a large hemoglobin and large leukocyte esterase.  Microscopic examination shows greater than 50 RBC, greater than 50 WBC, many bacteria with presence of WBC clumps.  Coronavirus, influenza and RSV PCR were negative.  CBC showed a white count of 15.5 with 80% neutrophils, hemoglobin 14.2 g/dL and platelets 952.  Lipase level and troponin were normal.  Lactic acid 2.1 mmol/L.  Venous blood gas showed a pH of 7.24, pCO2 of 69 and pO2 of 48 mmHg.  Bicarbonate was 29.6 and acid-base esses 0.4 mmol/L.  CMP showed a potassium of 6.2 mmol/L, the rest of the electrolytes were normal after sodium/chloride correction.  Glucose 401, BUN 43 and creatinine 3.22 mg/dL.  Most recent creatinine levels ranged from 1.08 to 1.55 mg/dL 7 to 8 months ago.  Total protein was 9.0 g/dL and AST 65 units/L.  The rest of the LFTs were normal.  Imaging: CT  head without contrast with no acute or reversible finding.  There are remote right cerebral infarcts.  Portable 1 view chest radiograph with normal lung volumes, left basilar linear opacities.  No pleural effusion or pneumothorax.  Enlarged cardiomediastinal silhouette likely projectional.  No acute osseous abnormality.   ED course: Initial vital signs were temperature 99.5 F, pulse 91, respiration -, BP 134/76 mmHg and O2 sat 82% on room air.  The patient received LR 1000 mL liter bolus, NS 500 mL bolus, calcium gluconate 1 g IVPB, a continuous albuterol 10 mg neb, cefepime 2 g IVPB, metronidazole 500 mg and vancomycin 2000 mg IVPB.  Review of Systems: As mentioned in the history of present illness. All other systems reviewed and are negative. Past Medical History:  Diagnosis Date   Asthma    Bilateral pulmonary embolism (HCC) 11/15/2019   Chronic kidney disease, stage 3a (HCC) 02/09/2021   Chronic respiratory failure with hypoxia (HCC) 02/09/2021   COPD (chronic obstructive pulmonary disease) (HCC)    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   Former smoker 36 pack year smoking history     Diabetes mellitus without complication (HCC)    Essential hypertension 05/04/2009   Qualifier: Diagnosis of  By: Roxan Hockey CMA, Jessica     GERD without esophagitis 02/09/2021   Grade I diastolic dysfunction 12/02/2021   Hypertension    Hypothyroidism 02/09/2021   Mixed 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 (HCC) 02/09/2021   Pulmonary embolism (HCC) 11/16/2019   Stroke (HCC)    Type 2 diabetes mellitus with stage 3a chronic  kidney disease, with long-term current use of insulin (HCC) 02/09/2021   History reviewed. No pertinent surgical history. Social History:  reports that she quit smoking about 3 years ago. Her smoking use included cigarettes. She started smoking about 39 years ago. She has a 18.1 pack-year smoking history. She has never used smokeless tobacco. She  reports that she does not drink alcohol and does not use drugs.  Allergies  Allergen Reactions   Cipro [Ciprofloxacin Hcl] Hives   Guaifenesin Anaphylaxis   Kiwi Extract Anaphylaxis and Rash   Levaquin [Levofloxacin] Shortness Of Breath and Rash   Strawberry Extract Anaphylaxis and Rash   Lioresal [Baclofen] Other (See Comments)    Near syncope/ fall    Family History  Problem Relation Age of Onset   Diabetes Mother    COPD Sister    Heart failure Sister    Breast cancer Sister     Prior to Admission medications   Medication Sig Start Date End Date Taking? Authorizing Provider  acetaminophen (TYLENOL) 500 MG tablet Take 1,000 mg by mouth in the morning, at noon, and at bedtime.   Yes [provider]  albuterol (VENTOLIN HFA) 108 (90 Base) MCG/ACT inhaler Inhale 2 puffs into the lungs every 6 (six) hours as needed for wheezing or shortness of breath. 10/04/19  Yes Grayce Sessions, NP  apixaban (ELIQUIS) 5 MG TABS tablet Take 1 tablet (5mg ) twice daily Patient taking differently: Take 5 mg by mouth 2 (two) times daily. 09/10/22  Yes Jerald Kief, MD  aspirin 81 MG chewable tablet Chew 81 mg by mouth in the morning.   Yes [provider]  atenolol (TENORMIN) 25 MG tablet Take 50 mg by mouth 2 (two) times daily.   Yes [provider]  atorvastatin (LIPITOR) 80 MG tablet Take 1 tablet (80 mg total) by mouth daily. Patient taking differently: Take 80 mg by mouth at bedtime. 10/04/19  Yes Grayce Sessions, NP  B Complex-Biotin-FA TABS Take 1 tablet by mouth in the morning.   Yes [provider]  chlorhexidine (PERIDEX) 0.12 % solution Use as directed 15 mLs in the mouth or throat every morning. Rinse and spit after oral care. Do not swallow.   Yes [provider]  clonazePAM (KLONOPIN) 0.5 MG tablet Take 0.5 mg by mouth 2 (two) times daily.   Yes [provider]  cyclobenzaprine (FLEXERIL) 10 MG tablet Take 1 tablet (10 mg  total) by mouth 3 (three) times daily as needed for muscle spasms. Patient taking differently: Take 10 mg by mouth in the morning and at bedtime. 10/04/19  Yes Grayce Sessions, NP  Dulaglutide (TRULICITY) 0.75 MG/0.5ML SOPN Inject 0.75 mg into the skin every Friday.   Yes [provider]  EPINEPHrinesnap 1 MG/ML KIT Inject 1 mg as directed as needed (allergic reaction).   Yes [provider]  ezetimibe (ZETIA) 10 MG tablet Take 10 mg by mouth at bedtime.   Yes [provider]  FLUoxetine (PROZAC) 20 MG capsule Take 20 mg by mouth daily.   Yes [provider]  gabapentin (NEURONTIN) 400 MG capsule Take 1 capsule (400 mg total) by mouth 3 (three) times daily. 09/10/22  Yes Jerald Kief, MD  hydrALAZINE (APRESOLINE) 10 MG tablet Take 10 mg by mouth every 12 (twelve) hours as needed (BP>160 or dBP >95).   Yes [provider]  insulin glargine (LANTUS) 100 UNIT/ML injection Inject 0.1 mLs (10 Units total) into the skin 2 (two) times  daily. (discard vial 28 days after first use) Patient taking differently: Inject 38 Units into the skin 2 (two) times daily. 12/18/22  Yes Jonah Blue, MD  insulin lispro (HUMALOG) 100 UNIT/ML injection Inject 0-14 Units into the skin See admin instructions. Inject 0-14 units twice daily as per sliding scale : CBG 0-200 : 0 units CBG 201-250 : 2 units CBG 251-300 : 4 units CBG 301-350 : 6 units CBG 351-400 : 8 units CBG 401-450 : 10 units CBG 451-500 : 12 units (if BP reads high, inject 14 units)   Yes [provider]  LACTOBACILLUS PO Take 1 capsule by mouth daily.   Yes [provider]  lansoprazole (PREVACID) 30 MG capsule Take 30 mg by mouth every morning.   Yes [provider]  levothyroxine (SYNTHROID) 88 MCG tablet Take 88 mcg by mouth daily before breakfast.   Yes [provider]  losartan (COZAAR) 25 MG tablet Take 37.5 mg by mouth daily.   Yes [provider]   magnesium oxide (MAG-OX) 400 (240 Mg) MG tablet Take 400 mg by mouth every other day.   Yes [provider]  Melatonin 5 MG TBDP Take 5 mg by mouth at bedtime.   Yes [provider]  Menthol, Topical Analgesic, 5 % GEL Apply 1 application  topically in the morning. To neck   Yes [provider]  nitrofurantoin, macrocrystal-monohydrate, (MACROBID) 100 MG capsule Take 100 mg by mouth at bedtime.   Yes [provider]  Omega-3 Fatty Acids (FISH OIL) 1000 MG CAPS Take 1,000 mg by mouth 3 (three) times daily.   Yes [provider]  ondansetron (ZOFRAN) 4 MG tablet Take 4 mg by mouth 2 (two) times daily.   Yes [provider]  OXYGEN Inhale 2 L/min into the lungs as needed (for shortness of breath).   Yes [provider]  Propyl Glycol-Hydroxyethylcell (NASAL MOIST) GEL Place 1 application  into both nostrils at bedtime.   Yes [provider]  QUEtiapine (SEROQUEL) 25 MG tablet Take 25 mg by mouth at bedtime.   Yes [provider]  rOPINIRole (REQUIP) 0.25 MG tablet Take 0.25 mg by mouth at bedtime.   Yes [provider]  Skin Protectants, Misc. (EUCERIN) cream Apply 1 Application topically daily as needed for dry skin.   Yes [provider]  SYMBICORT 80-4.5 MCG/ACT inhaler Inhale 2 puffs into the lungs 2 (two) times daily.   Yes [provider]  Tiotropium Bromide Monohydrate (SPIRIVA RESPIMAT) 2.5 MCG/ACT AERS Inhale 2 puffs into the lungs in the morning.   Yes [provider]  witch hazel-glycerin (TUCKS) pad Apply 1 Application topically every 4 (four) hours as needed for hemorrhoids.   Yes [provider]  ergocalciferol (VITAMIN D2) 1.25 MG (50000 UT) capsule Take 50,000 Units by mouth every Thursday.    [provider]    Physical Exam: Vitals:   09/06/23 1131 09/06/23 1145 09/06/23 1400  BP: 134/76  107/70  Pulse: 91  87  Resp:   13  Temp: 99.5 F (37.5  C)    TempSrc: Oral    SpO2: (!) 82% 100% 99%   Physical Exam Vitals and nursing note reviewed.  Constitutional:      General: She is sleeping.     Appearance: Normal appearance. She is obese. She is ill-appearing.     Interventions: Nasal cannula in place.  HENT:     Head: Normocephalic.     Nose: No rhinorrhea.  Mouth/Throat:     Mouth: Mucous membranes are dry.  Eyes:     General: No scleral icterus.    Pupils: Pupils are equal, round, and reactive to light.  Neck:     Vascular: No JVD.  Cardiovascular:     Rate and Rhythm: Normal rate and regular rhythm.     Heart sounds: S1 normal and S2 normal.  Pulmonary:     Effort: No tachypnea or respiratory distress.     Breath sounds: Examination of the right-lower field reveals decreased breath sounds. Examination of the left-lower field reveals decreased breath sounds. Decreased breath sounds present. No wheezing, rhonchi or rales.  Abdominal:     General: Abdomen is protuberant. Bowel sounds are normal. There is no distension.     Palpations: Abdomen is soft.     Tenderness: There is no abdominal tenderness. There is no right CVA tenderness or left CVA tenderness.  Musculoskeletal:     Cervical back: Neck supple.     Right lower leg: No edema.     Left lower leg: No edema.  Skin:    General: Skin is warm and dry.  Neurological:     Mental Status: She is easily aroused. She is lethargic, disoriented and confused.     Cranial Nerves: Cranial nerve deficit present.     Motor: Weakness present.     Data Reviewed:  Results are pending, will review when available. 05/31/2021 transthoracic echocardiogram IMPRESSIONS:   1. Left ventricular ejection fraction, by estimation, is 55 to 60%. The  left ventricle has normal function. The left ventricle has no regional  wall motion abnormalities. There is mild left ventricular hypertrophy.  Left ventricular diastolic parameters  are consistent with Grade I diastolic dysfunction  (impaired relaxation).   2. Right ventricular systolic function is normal. The right ventricular  size is normal. Tricuspid regurgitation signal is inadequate for assessing  PA pressure.   3. The mitral valve is normal in structure. No evidence of mitral valve  regurgitation. No evidence of mitral stenosis.   4. The aortic valve was not well visualized. Aortic valve regurgitation  is not visualized. No aortic stenosis is present.   5. The inferior vena cava is normal in size with greater than 50%  respiratory variability, suggesting right atrial pressure of 3 mmHg.   EKG: Vent. rate 89 BPM PR interval 187 ms QRS duration 102 ms QT/QTcB 369/449 ms P-R-T axes 64 56 64 Sinus rhythm Low voltage, precordial leads  Assessment and Plan: Principal Problem:   Acute metabolic encephalopathy On a patient with history of:   UTI due to extended-spectrum beta lactamase (ESBL) producing Escherichia coli   Sepsis secondary to UTI (HCC)   Hypotension  Admit to SDU/inpatient. Continue IV fluids. Hold antihypertensives. Hold sedating medications. Meropenem per pharmacy. Follow-up urine culture and sensitivity. Follow-up blood culture and sensitivity Follow CBC and CMP in a.m.  Active Problems:   Acute renal failure superimposed on stage 3a chronic kidney disease (HCC) Continue IV fluids. Hold losartan 25 mg daily. Avoid hypotension. Avoid nephrotoxins. Monitor intake and output. Monitor renal function/electrolytes.    Acute chronic respiratory failure with hypoxia and hypercapnia (HCC)   COPD with chronic bronchitis (HCC) Continue supplemental oxygen. Scheduled/as needed bronchodilators. BiPAP as needed. BiPAP at bedtime.    Essential hypertension Holding antihypertensives now.    Grade I diastolic dysfunction Currently volume depleted. Hold beta-blocker and losartan.    hx of PE/DVT Continue apixaban 5 mg p.o. twice daily.    GERD  without esophagitis Continue lansoprazole  30 mg daily or formulary equivalent.    DM (diabetes mellitus), type 2 (HCC) Carbohydrate modified diet. CBG monitoring with RI SS. Check hemoglobin A1c. Hold long-acting insulin until more alert/improved GFR Also on Trulicity injections every Friday.    Hypothyroidism Continue levothyroxine 88 mcg p.o. daily.    Tobacco abuse In remission.    Dyslipidemia On atorvastatin 80 mg daily.Marland Kitchen    History of stroke Resulting in:   Hemiparesis affecting left side as late effect of cerebrovascular accident (CVA) (HCC) CT head negative for acute CVA. Continue supportive care.    Class 1 obesity due to excess calories with body mass index (BMI) of 32.0 to 32.9 in adult Awaiting weight check for BMI calculation. Lifestyle modifications. Follow-up with primary as an outpatient.    Advance Care Planning:   Code Status: Full Code   Consults:   Family Communication: Her mother was at bedside.  I also spoke to her sister Lurena Joiner over the phone.  Severity of Illness: The appropriate patient status for this patient is INPATIENT. Inpatient status is judged to be reasonable and necessary in order to provide the required intensity of service to ensure the patient's safety. The patient's presenting symptoms, physical exam findings, and initial radiographic and laboratory data in the context of their chronic comorbidities is felt to place them at high risk for further clinical deterioration. Furthermore, it is not anticipated that the patient will be medically stable for discharge from the hospital within 2 midnights of admission.   * I certify that at the point of admission it is my clinical judgment that the patient will require inpatient hospital care spanning beyond 2 midnights from the point of admission due to high intensity of service, high risk for further deterioration and high frequency of surveillance required.*  Author: Bobette Mo, MD 09/06/2023 2:42 PM  For on call review  [email or web address].   This document was prepared using Dragon voice recognition software and may contain some unintended transcription errors.

## 2023-09-06 NOTE — ED Notes (Signed)
I Stat Lactic- 2.08 RN notified

## 2023-09-07 ENCOUNTER — Inpatient Hospital Stay (HOSPITAL_COMMUNITY): Payer: Medicaid Other

## 2023-09-07 DIAGNOSIS — G9341 Metabolic encephalopathy: Secondary | ICD-10-CM | POA: Diagnosis not present

## 2023-09-07 DIAGNOSIS — E039 Hypothyroidism, unspecified: Secondary | ICD-10-CM

## 2023-09-07 DIAGNOSIS — J9622 Acute and chronic respiratory failure with hypercapnia: Secondary | ICD-10-CM

## 2023-09-07 DIAGNOSIS — N1831 Chronic kidney disease, stage 3a: Secondary | ICD-10-CM

## 2023-09-07 DIAGNOSIS — N1832 Chronic kidney disease, stage 3b: Secondary | ICD-10-CM

## 2023-09-07 DIAGNOSIS — Z794 Long term (current) use of insulin: Secondary | ICD-10-CM

## 2023-09-07 DIAGNOSIS — J9621 Acute and chronic respiratory failure with hypoxia: Secondary | ICD-10-CM

## 2023-09-07 DIAGNOSIS — J4489 Other specified chronic obstructive pulmonary disease: Secondary | ICD-10-CM

## 2023-09-07 DIAGNOSIS — M7989 Other specified soft tissue disorders: Secondary | ICD-10-CM

## 2023-09-07 DIAGNOSIS — E1122 Type 2 diabetes mellitus with diabetic chronic kidney disease: Secondary | ICD-10-CM

## 2023-09-07 DIAGNOSIS — E785 Hyperlipidemia, unspecified: Secondary | ICD-10-CM

## 2023-09-07 DIAGNOSIS — I10 Essential (primary) hypertension: Secondary | ICD-10-CM

## 2023-09-07 DIAGNOSIS — N390 Urinary tract infection, site not specified: Secondary | ICD-10-CM | POA: Diagnosis not present

## 2023-09-07 DIAGNOSIS — N179 Acute kidney failure, unspecified: Secondary | ICD-10-CM

## 2023-09-07 LAB — CBC WITH DIFFERENTIAL/PLATELET
Abs Immature Granulocytes: 0.06 10*3/uL (ref 0.00–0.07)
Basophils Absolute: 0 10*3/uL (ref 0.0–0.1)
Basophils Relative: 0 %
Eosinophils Absolute: 0.1 10*3/uL (ref 0.0–0.5)
Eosinophils Relative: 1 %
HCT: 40 % (ref 36.0–46.0)
Hemoglobin: 11.9 g/dL — ABNORMAL LOW (ref 12.0–15.0)
Immature Granulocytes: 1 %
Lymphocytes Relative: 24 %
Lymphs Abs: 2.5 10*3/uL (ref 0.7–4.0)
MCH: 27.2 pg (ref 26.0–34.0)
MCHC: 29.8 g/dL — ABNORMAL LOW (ref 30.0–36.0)
MCV: 91.5 fL (ref 80.0–100.0)
Monocytes Absolute: 0.8 10*3/uL (ref 0.1–1.0)
Monocytes Relative: 8 %
Neutro Abs: 7 10*3/uL (ref 1.7–7.7)
Neutrophils Relative %: 66 %
Platelets: 210 10*3/uL (ref 150–400)
RBC: 4.37 MIL/uL (ref 3.87–5.11)
RDW: 14.4 % (ref 11.5–15.5)
WBC: 10.4 10*3/uL (ref 4.0–10.5)
nRBC: 0 % (ref 0.0–0.2)

## 2023-09-07 LAB — GLUCOSE, CAPILLARY
Glucose-Capillary: 200 mg/dL — ABNORMAL HIGH (ref 70–99)
Glucose-Capillary: 199 mg/dL — ABNORMAL HIGH (ref 70–99)
Glucose-Capillary: 198 mg/dL — ABNORMAL HIGH (ref 70–99)
Glucose-Capillary: 178 mg/dL — ABNORMAL HIGH (ref 70–99)
Glucose-Capillary: 142 mg/dL — ABNORMAL HIGH (ref 70–99)
Glucose-Capillary: 192 mg/dL — ABNORMAL HIGH (ref 70–99)

## 2023-09-07 LAB — COMPREHENSIVE METABOLIC PANEL
ALT: 29 U/L (ref 0–44)
AST: 103 U/L — ABNORMAL HIGH (ref 15–41)
Albumin: 2.8 g/dL — ABNORMAL LOW (ref 3.5–5.0)
Alkaline Phosphatase: 64 U/L (ref 38–126)
Anion gap: 8 (ref 5–15)
BUN: 38 mg/dL — ABNORMAL HIGH (ref 6–20)
CO2: 26 mmol/L (ref 22–32)
Calcium: 8.8 mg/dL — ABNORMAL LOW (ref 8.9–10.3)
Chloride: 101 mmol/L (ref 98–111)
Creatinine, Ser: 2.43 mg/dL — ABNORMAL HIGH (ref 0.44–1.00)
GFR, Estimated: 24 mL/min — ABNORMAL LOW (ref >60)
Glucose, Bld: 249 mg/dL — ABNORMAL HIGH (ref 70–99)
Potassium: 4.9 mmol/L (ref 3.5–5.1)
Sodium: 135 mmol/L (ref 135–145)
Total Bilirubin: 0.4 mg/dL (ref <1.2)
Total Protein: 6.3 g/dL — ABNORMAL LOW (ref 6.5–8.1)

## 2023-09-07 MED ORDER — INSULIN GLARGINE-YFGN 100 UNIT/ML ~~LOC~~ SOLN: 20.0000 [IU] | Freq: Two times a day (BID) | SUBCUTANEOUS | Status: DC

## 2023-09-07 MED ORDER — INSULIN GLARGINE-YFGN 100 UNIT/ML ~~LOC~~ SOLN
15.0000 [IU] | Freq: Two times a day (BID) | SUBCUTANEOUS | Status: DC
  Administered 2023-09-07 – 2023-09-08 (×3): 15 [IU] via SUBCUTANEOUS
  Filled 2023-09-07 (×5): qty 0.15

## 2023-09-07 MED ORDER — TRAMADOL HCL 50 MG PO TABS
50.0000 mg | ORAL_TABLET | Freq: Two times a day (BID) | ORAL | Status: DC | PRN
  Administered 2023-09-07 – 2023-09-09 (×4): 50 mg via ORAL
  Filled 2023-09-07 (×4): qty 1

## 2023-09-07 MED ORDER — CALCIUM GLUCONATE-NACL 1-0.675 GM/50ML-% IV SOLN
1.0000 g | Freq: Once | INTRAVENOUS | Status: AC
  Administered 2023-09-07: 1000 mg via INTRAVENOUS
  Filled 2023-09-07: qty 50

## 2023-09-07 MED ORDER — DEXTROSE 50 % IV SOLN
50.0000 mL | Freq: Once | INTRAVENOUS | Status: AC
  Administered 2023-09-07: 50 mL via INTRAVENOUS
  Filled 2023-09-07: qty 50

## 2023-09-07 MED ORDER — INSULIN ASPART 100 UNIT/ML IV SOLN
10.0000 [IU] | Freq: Once | INTRAVENOUS | Status: AC
  Administered 2023-09-07: 10 [IU] via INTRAVENOUS

## 2023-09-07 MED ORDER — MORPHINE SULFATE (PF) 2 MG/ML IV SOLN
1.0000 mg | INTRAVENOUS | Status: DC | PRN
  Administered 2023-09-07 (×3): 1 mg via INTRAVENOUS
  Administered 2023-09-08 – 2023-09-09 (×5): 2 mg via INTRAVENOUS
  Filled 2023-09-07 (×8): qty 1

## 2023-09-07 MED ORDER — DEXTROSE 50 % IV SOLN
INTRAVENOUS | Status: AC
  Filled 2023-09-07: qty 50

## 2023-09-07 MED ORDER — ALBUTEROL SULFATE (2.5 MG/3ML) 0.083% IN NEBU
2.5000 mg | INHALATION_SOLUTION | Freq: Three times a day (TID) | RESPIRATORY_TRACT | Status: DC
  Administered 2023-09-07 – 2023-09-10 (×8): 2.5 mg via RESPIRATORY_TRACT
  Filled 2023-09-07 (×8): qty 3

## 2023-09-07 NOTE — Plan of Care (Signed)
  Problem: Clinical Measurements: Goal: Ability to maintain clinical measurements within normal limits will improve Outcome: Progressing Goal: Respiratory complications will improve Outcome: Progressing Goal: Cardiovascular complication will be avoided Outcome: Progressing   Problem: Elimination: Goal: Will not experience complications related to urinary retention Outcome: Progressing   Problem: Nutritional: Goal: Maintenance of adequate nutrition will improve Outcome: Not Progressing   Problem: Coping: Goal: Level of anxiety will decrease Outcome: Not Progressing   Problem: Pain Management: Goal: General experience of comfort will improve Outcome: Not Progressing

## 2023-09-07 NOTE — Progress Notes (Signed)
   09/07/23 2359  BiPAP/CPAP/SIPAP  BiPAP/CPAP/SIPAP Pt Type Adult  BiPAP/CPAP/SIPAP DREAMSTATIOND  Mask Type Full face mask  Mask Size Medium  IPAP 12 cmH20  EPAP 6 cmH2O  FiO2 (%) 36 %  Flow Rate 4 lpm  Patient Home Equipment No  Auto Titrate No

## 2023-09-07 NOTE — Plan of Care (Signed)
  Problem: Clinical Measurements: Goal: Respiratory complications will improve Outcome: Progressing  Pt maintaining oxygen saturation above 92% on 4LNC

## 2023-09-07 NOTE — Progress Notes (Signed)
LLE venous duplex has been completed.   Results can be found under chart review under CV PROC. 09/07/2023 4:08 PM Zamia Tyminski RVT, RDMS

## 2023-09-07 NOTE — Progress Notes (Signed)
[facility]                                                                               Karla Hoover, is a 52 y.o. female, DOB - [birth year], ONG:[id number] Admit date - 09/06/2023    Outpatient Primary MD for the patient is Pcp, No  LOS - 1  days    Brief summary    Karla Hoover is a 52 y.o. female with medical history significant of asthma, bilateral pulmonary embolism, stage IIIa CKD, history of AKI episode, history of COPD, chronic respiratory failure with hypoxia occasionally on oxygen, type 2 diabetes, hypertension, GERD, grade 1 diastolic dysfunction, hypertension, hypothyroidism, hyperlipidemia, history of CVA with left-sided hemiparesis who is sent from her facility due to altered mental status and hyperglycemia.   CT head without contrast with no acute or reversible finding. There are remote right cerebral infarcts.   Assessment & Plan    Assessment and Plan:  Acute metabolic encephalopathy:  - suspect from UTI and possible cellulitis of the left lower extremity.  Patient is more alert and answering questions and responding to them.  - slowly improving.    Urinary tract infection:  On IV Meropenem.  Urine cultures show 1,00,000 gram negative rods. Follow up on identification and sensitivities.     AKI Admitted with a creatinine of 3.2, slowly improving with IV fluids.  Creatinine today is 2.43. creatinine at baseline is around 1.5.  Will get US renal.    Elevated lactic acid Pt received 4 lit of Fluids.   From UTI, cellulitis.  Repeat level in am.     Uncontrolled DM with hyperglycemia:A1c is 10.3 CBG (last 3)  Recent Labs    09/07/23 0311 09/07/23 0753 09/07/23 1219  GLUCAP 199* 198* 178*   Restarted her on Semglee 15 units BID.   Bilateral PE;  On eliquis resume the same.    Hyperlipidemia:  Resume home meds.    Hyperkalemia:  Resolved.   Left lower extremity swelling and redness  Cellulitis.  MRSA PCR is  negative.  On IV antibiotics.   Hypothyroidism Resume synthroid 88 mcg daily.   Chronic respiratory failure with hypoxia secondary to COPD:  Continue with bronchodilators as needed.    Estimated body mass index is 34.15 kg/m as calculated from the following:   Height as of this encounter: 5\' 7"  (1.702 m).   Weight as of this encounter: 98.9 kg.  Code Status: full code.  DVT Prophylaxis:   apixaban (ELIQUIS) tablet 5 mg   Level of Care: Level of care: Stepdown Family Communication: Updated patient's family at bedside  Disposition Plan:     Remains inpatient appropriate:  IV antibiotics.   Procedures:  None.   Consultants:   none  Antimicrobials:   Anti-infectives (From admission, onward)    Start     Dose/Rate Route Frequency Ordered Stop   09/07/23 0600  meropenem (MERREM) 1 g in sodium chloride 0.9 % 100 mL IVPB        1 g 200 mL/hr over 30 Minutes Intravenous Every 12 hours 09/06/23 1707     09/06/23 1545  meropenem (MERREM) 1 g in sodium chloride 0.9 %  100 mL IVPB       Note to Pharmacy: Please adjust as needed.   1 g 200 mL/hr over 30 Minutes Intravenous  Once 09/06/23 1526 09/06/23 1759   09/06/23 1230  vancomycin (VANCOREADY) IVPB 2000 mg/400 mL        2,000 mg 200 mL/hr over 120 Minutes Intravenous  Once 09/06/23 1150 09/06/23 1726   09/06/23 1200  ceFEPIme (MAXIPIME) 2 g in sodium chloride 0.9 % 100 mL IVPB        2 g 200 mL/hr over 30 Minutes Intravenous  Once 09/06/23 1147 09/06/23 1344   09/06/23 1200  metroNIDAZOLE (FLAGYL) IVPB 500 mg        500 mg 100 mL/hr over 60 Minutes Intravenous  Once 09/06/23 1147 09/06/23 1452   09/06/23 1200  vancomycin (VANCOCIN) IVPB 1000 mg/200 mL premix  Status:  Discontinued        1,000 mg 200 mL/hr over 60 Minutes Intravenous  Once 09/06/23 1147 09/06/23 1150        Medications  Scheduled Meds:  acetaminophen  1,000 mg Oral Once   albuterol  2.5 mg Nebulization TID   apixaban  5 mg Oral BID   aspirin EC   81 mg Oral Daily   atorvastatin  80 mg Oral QHS   Chlorhexidine Gluconate Cloth  6 each Topical Daily   ezetimibe  10 mg Oral QHS   FLUoxetine  20 mg Oral Daily   insulin aspart  0-15 Units Subcutaneous TID WC   levothyroxine  88 mcg Oral QAC breakfast   magnesium oxide  400 mg Oral QODAY   mometasone-formoterol  2 puff Inhalation BID   mouth rinse  15 mL Mouth Rinse 4 times per day   pantoprazole  40 mg Oral Daily   umeclidinium bromide  1 puff Inhalation Daily   Continuous Infusions:  meropenem (MERREM) IV Stopped (09/07/23 0731)   PRN Meds:.acetaminophen **OR** acetaminophen, albuterol, morphine injection, ondansetron **OR** ondansetron (ZOFRAN) IV, mouth rinse, traMADol    Subjective:   Karla Hoover was seen and examined today.  Pt is talking, responding to questions.   Objective:   Vitals:   09/07/23 1000 09/07/23 1100 09/07/23 1137 09/07/23 1200  BP: (!) 142/60 136/62  (!) 128/47  Pulse: 84 85  86  Resp: 17 17  (!) 6  Temp: 99.1 F (37.3 C) 99.1 F (37.3 C)  99.1 F (37.3 C)  TempSrc:      SpO2: 97% 96% 96% 96%  Weight:      Height:        Intake/Output Summary (Last 24 hours) at 09/07/2023 1331 Last data filed at 09/07/2023 1200 Gross per 24 hour  Intake 7218.25 ml  Output 2050 ml  Net 5168.25 ml   Filed Weights   09/06/23 1655  Weight: 98.9 kg     Exam General exam: Appears calm and comfortable  Respiratory system: Clear to auscultation. Respiratory effort normal. Cardiovascular system: S1 & S2 heard, RRR. No JVD,  Gastrointestinal system: Abdomen is nondistended, soft and nontender.  Central nervous system: Alert and oriented to person only today.  Extremities: left lower extremity swelling and tenderness, erythema on the lower aspect on the leg.  Skin: No rashes,  Psychiatry: unable to assess.    Data Reviewed:  I have personally reviewed following labs and imaging studies   CBC Lab Results  Component Value Date   WBC 10.4 09/07/2023    RBC 4.37 09/07/2023   HGB 11.9 (L) 09/07/2023  HCT 40.0 09/07/2023   MCV 91.5 09/07/2023   MCH 27.2 09/07/2023   PLT 210 09/07/2023   MCHC 29.8 (L) 09/07/2023   RDW 14.4 09/07/2023   LYMPHSABS 2.5 09/07/2023   MONOABS 0.8 09/07/2023   EOSABS 0.1 09/07/2023   BASOSABS 0.0 09/07/2023     Last metabolic panel Lab Results  Component Value Date   NA 135 09/07/2023   K 4.9 09/07/2023   CL 101 09/07/2023   CO2 26 09/07/2023   BUN 38 (H) 09/07/2023   CREATININE 2.43 (H) 09/07/2023   GLUCOSE 249 (H) 09/07/2023   GFRNONAA 24 (L) 09/07/2023   GFRAA >60 07/08/2020   CALCIUM 8.8 (L) 09/07/2023   PHOS 1.7 (L) 12/02/2021   PROT 6.3 (L) 09/07/2023   ALBUMIN 2.8 (L) 09/07/2023   BILITOT 0.4 09/07/2023   ALKPHOS 64 09/07/2023   AST 103 (H) 09/07/2023   ALT 29 09/07/2023   ANIONGAP 8 09/07/2023    CBG (last 3)  Recent Labs    09/07/23 0311 09/07/23 0753 09/07/23 1219  GLUCAP 199* 198* 178*      Coagulation Profile: No results for input(s): "INR", "PROTIME" in the last 168 hours.   Radiology Studies: DG Chest Portable 1 View  Result Date: 09/06/2023 CLINICAL DATA:  Altered mental status and hyperglycemia EXAM: PORTABLE CHEST 1 VIEW COMPARISON:  Chest radiograph dated 01/21/2023 FINDINGS: Normal lung volumes. Left basilar linear opacities. No pleural effusion or pneumothorax. Enlarged cardiomediastinal silhouette is likely projectional. No acute osseous abnormality. IMPRESSION: Left basilar linear opacities, likely atelectasis. Electronically Signed   By: Agustin Cree M.D.   On: 09/06/2023 12:24   CT Head Wo Contrast  Result Date: 09/06/2023 CLINICAL DATA:  Mental status change with unknown cause. EXAM: CT HEAD WITHOUT CONTRAST TECHNIQUE: Contiguous axial images were obtained from the base of the skull through the vertex without intravenous contrast. RADIATION DOSE REDUCTION: This exam was performed according to the departmental dose-optimization program which includes  automated exposure control, adjustment of the mA and/or kV according to patient size and/or use of iterative reconstruction technique. COMPARISON:  12/13/2022 FINDINGS: Brain: No evidence of acute infarction, hemorrhage, hydrocephalus, extra-axial collection or mass lesion/mass effect. Chronic perforator infarct at the right basal ganglia with patchy chronic infarcts along the right frontal parietal cortex. Wallerian degeneration seen into the brainstem. Vascular: No hyperdense vessel or unexpected calcification. Skull: Normal. Negative for fracture or focal lesion. Sinuses/Orbits: No acute finding. IMPRESSION: 1. No acute or reversible finding. 2. Remote right cerebral infarcts. Electronically Signed   By: Tiburcio Pea M.D.   On: 09/06/2023 12:17       Kathlen Mody M.D. [facility] 09/07/2023, 1:31 PM  Available via Epic secure chat 7am-7pm After 7 pm, please refer to night coverage provider listed on amion.

## 2023-09-08 ENCOUNTER — Inpatient Hospital Stay (HOSPITAL_COMMUNITY): Payer: Medicaid Other

## 2023-09-08 DIAGNOSIS — R0609 Other forms of dyspnea: Secondary | ICD-10-CM | POA: Diagnosis not present

## 2023-09-08 DIAGNOSIS — N390 Urinary tract infection, site not specified: Secondary | ICD-10-CM | POA: Diagnosis not present

## 2023-09-08 DIAGNOSIS — G9341 Metabolic encephalopathy: Secondary | ICD-10-CM | POA: Diagnosis not present

## 2023-09-08 DIAGNOSIS — E1122 Type 2 diabetes mellitus with diabetic chronic kidney disease: Secondary | ICD-10-CM | POA: Diagnosis not present

## 2023-09-08 DIAGNOSIS — N179 Acute kidney failure, unspecified: Secondary | ICD-10-CM | POA: Diagnosis not present

## 2023-09-08 LAB — COMPREHENSIVE METABOLIC PANEL
ALT: 29 U/L (ref 0–44)
AST: 79 U/L — ABNORMAL HIGH (ref 15–41)
Albumin: 2.7 g/dL — ABNORMAL LOW (ref 3.5–5.0)
Alkaline Phosphatase: 60 U/L (ref 38–126)
Anion gap: 6 (ref 5–15)
BUN: 24 mg/dL — ABNORMAL HIGH (ref 6–20)
CO2: 27 mmol/L (ref 22–32)
Calcium: 9 mg/dL (ref 8.9–10.3)
Chloride: 101 mmol/L (ref 98–111)
Creatinine, Ser: 1.44 mg/dL — ABNORMAL HIGH (ref 0.44–1.00)
GFR, Estimated: 44 mL/min — ABNORMAL LOW (ref >60)
Glucose, Bld: 222 mg/dL — ABNORMAL HIGH (ref 70–99)
Potassium: 5.1 mmol/L (ref 3.5–5.1)
Sodium: 134 mmol/L — ABNORMAL LOW (ref 135–145)
Total Bilirubin: 0.5 mg/dL (ref <1.2)
Total Protein: 5.7 g/dL — ABNORMAL LOW (ref 6.5–8.1)

## 2023-09-08 LAB — CBC WITH DIFFERENTIAL/PLATELET
Abs Immature Granulocytes: 0.04 10*3/uL (ref 0.00–0.07)
Basophils Absolute: 0 10*3/uL (ref 0.0–0.1)
Basophils Relative: 0 %
Eosinophils Absolute: 0.2 10*3/uL (ref 0.0–0.5)
Eosinophils Relative: 2 %
HCT: 35.9 % — ABNORMAL LOW (ref 36.0–46.0)
Hemoglobin: 11.7 g/dL — ABNORMAL LOW (ref 12.0–15.0)
Immature Granulocytes: 0 %
Lymphocytes Relative: 35 %
Lymphs Abs: 3.3 10*3/uL (ref 0.7–4.0)
MCH: 28.7 pg (ref 26.0–34.0)
MCHC: 32.6 g/dL (ref 30.0–36.0)
MCV: 88.2 fL (ref 80.0–100.0)
Monocytes Absolute: 0.8 10*3/uL (ref 0.1–1.0)
Monocytes Relative: 8 %
Neutro Abs: 5 10*3/uL (ref 1.7–7.7)
Neutrophils Relative %: 55 %
Platelets: 193 10*3/uL (ref 150–400)
RBC: 4.07 MIL/uL (ref 3.87–5.11)
RDW: 14.4 % (ref 11.5–15.5)
WBC: 9.3 10*3/uL (ref 4.0–10.5)
nRBC: 0 % (ref 0.0–0.2)

## 2023-09-08 LAB — BRAIN NATRIURETIC PEPTIDE: B Natriuretic Peptide: 141.1 pg/mL — ABNORMAL HIGH (ref 0.0–100.0)

## 2023-09-08 LAB — ECHOCARDIOGRAM COMPLETE
Area-P 1/2: 4.1 cm2
Height: 67 in
S' Lateral: 4.1 cm
Weight: 3488.56 [oz_av]

## 2023-09-08 LAB — GLUCOSE, CAPILLARY
Glucose-Capillary: 209 mg/dL — ABNORMAL HIGH (ref 70–99)
Glucose-Capillary: 229 mg/dL — ABNORMAL HIGH (ref 70–99)
Glucose-Capillary: 243 mg/dL — ABNORMAL HIGH (ref 70–99)
Glucose-Capillary: 205 mg/dL — ABNORMAL HIGH (ref 70–99)
Glucose-Capillary: 219 mg/dL — ABNORMAL HIGH (ref 70–99)

## 2023-09-08 LAB — LACTIC ACID, PLASMA: Lactic Acid, Venous: 1.4 mmol/L (ref 0.5–1.9)

## 2023-09-08 LAB — HIV ANTIBODY (ROUTINE TESTING W REFLEX): HIV Screen 4th Generation wRfx: NONREACTIVE

## 2023-09-08 MED ORDER — NICOTINE 14 MG/24HR TD PT24
14.0000 mg | MEDICATED_PATCH | Freq: Every day | TRANSDERMAL | Status: DC
  Administered 2023-09-08 – 2023-09-10 (×3): 14 mg via TRANSDERMAL
  Filled 2023-09-08 (×3): qty 1

## 2023-09-08 MED ORDER — ATENOLOL 25 MG PO TABS
50.0000 mg | ORAL_TABLET | Freq: Two times a day (BID) | ORAL | Status: DC
  Administered 2023-09-08 – 2023-09-10 (×5): 50 mg via ORAL
  Filled 2023-09-08 (×5): qty 2

## 2023-09-08 MED ORDER — SODIUM CHLORIDE 0.9 % IV SOLN
1.0000 g | Freq: Three times a day (TID) | INTRAVENOUS | Status: DC
  Administered 2023-09-08 – 2023-09-10 (×6): 1 g via INTRAVENOUS
  Filled 2023-09-08 (×8): qty 20

## 2023-09-08 MED ORDER — INSULIN GLARGINE-YFGN 100 UNIT/ML ~~LOC~~ SOLN
20.0000 [IU] | Freq: Two times a day (BID) | SUBCUTANEOUS | Status: DC
  Administered 2023-09-08 – 2023-09-10 (×4): 20 [IU] via SUBCUTANEOUS
  Filled 2023-09-08 (×6): qty 0.2

## 2023-09-08 MED ORDER — LOSARTAN POTASSIUM 50 MG PO TABS
25.0000 mg | ORAL_TABLET | Freq: Every day | ORAL | Status: DC
  Administered 2023-09-08: 25 mg via ORAL
  Filled 2023-09-08: qty 1

## 2023-09-08 MED ORDER — HYDRALAZINE HCL 25 MG PO TABS
25.0000 mg | ORAL_TABLET | Freq: Three times a day (TID) | ORAL | Status: DC | PRN
  Administered 2023-09-08: 25 mg via ORAL
  Filled 2023-09-08: qty 1

## 2023-09-08 MED ORDER — HYDRALAZINE HCL 20 MG/ML IJ SOLN
10.0000 mg | Freq: Four times a day (QID) | INTRAMUSCULAR | Status: DC | PRN
  Administered 2023-09-08: 10 mg via INTRAVENOUS
  Filled 2023-09-08: qty 1

## 2023-09-08 NOTE — Evaluation (Signed)
Occupational Therapy Evaluation Patient Details Name: Karla Hoover MRN: [id number] DOB: Apr 25, 1971 Today's Date: 09/08/2023   History of Present Illness Karla Hoover is a 52 y.o. female Admitted from  LTC SNF 09/06/23 with AMS and hyperglycemia.  Dx with UTI. PMH includes asthma, bilateral PE, stage IIIa CKD, history of AKI episode, history of COPD, chronic respiratory failure with hypoxia occasionally on oxygen, type 2 diabetes, HTN, GERD, grade 1 diastolic dysfunction, hypothyroidism, hyperlipidemia, history of CVA with left-sided hemiparesis.   Clinical Impression   Pt is from LTC at [facility]. She is an unreliable historian so PLOF will need to be confirmed. She reports being mod I for bathing/dressing and transfers at [facility] level. Hx of L hemi and today did not see LUE attempt to assist functionally at all today. LUE is generally stiff and Pt grimaced, and moaned in pain with attempts at PROM to digits. She states that she does not have a palm guard/cuff for at night. This should be considered during acute stay to see if she will tolerate it. LLE sensitive to touch when donning socks at max A bed level. She was mod A +2 for bed mobilty and transfers with stedy. Overall she is max A for LB ADL, mod A for UB ADL and min A to set up for grooming/self-feeding. OT will follow acutely and recommending OT post-acute at [facility] to return to PLOF.        If plan is discharge home, recommend the following: Two people to help with walking and/or transfers;A lot of help with bathing/dressing/bathroom;Assistance with feeding;Direct supervision/assist for medications management;Direct supervision/assist for financial management;Assist for transportation    Functional Status Assessment  Patient has had a recent decline in their functional status and demonstrates the ability to make significant improvements in function in a reasonable and predictable amount of time.  Equipment Recommendations   None recommended by OT    Recommendations for Other Services PT consult     Precautions / Restrictions Precautions Precautions: Fall Precaution Comments: L hemi Restrictions Weight Bearing Restrictions: No      Mobility Bed Mobility Overal bed mobility: Needs Assistance Bed Mobility: Supine to Sit     Supine to sit: Mod assist, +2 for physical assistance, +2 for safety/equipment, HOB elevated, Used rails     General bed mobility comments: vc for sequencing, assist for BLE and trunk elevation as well as use of bed pad to bring hips EOB. Pt mildly fearful of falling EOB - but calmed with assurance    Transfers Overall transfer level: Needs assistance Equipment used: Ambulation equipment used Transfers: Sit to/from Stand Sit to Stand: Mod assist, +2 safety/equipment, +2 physical assistance, Via lift equipment           General transfer comment: Pt sit<>stand x 5 from EOB and to<>from BSC. max multimodal cues and boost +2 mod A to achieve full upright from full seated position. When on stedy paddles, able to achieve standing with +2 min A. Pt only uses R hand to pull into standing. LUE by side Transfer via Lift Equipment: Stedy    Balance Overall balance assessment: Needs assistance Sitting-balance support: Single extremity supported, Feet supported Sitting balance-Leahy Scale: Fair Sitting balance - Comments: able to sit CGA EOB (ICU)   Standing balance support: Single extremity supported, During functional activity, Reliant on assistive device for balance Standing balance-Leahy Scale: Poor Standing balance comment: dependent on support of stedy and therapy team  ADL either performed or assessed with clinical judgement   ADL Overall ADL's : Needs assistance/impaired     Grooming: Wash/dry face;Contact guard assist;Sitting Grooming Details (indicate cue type and reason): uses R hand, needs assist for BUE tasks Upper Body Bathing:  Moderate assistance   Lower Body Bathing: Moderate assistance   Upper Body Dressing : Maximal assistance Upper Body Dressing Details (indicate cue type and reason): assist with gown Lower Body Dressing: Maximal assistance;Bed level Lower Body Dressing Details (indicate cue type and reason): donning socks Toilet Transfer: Moderate assistance;+2 for physical assistance;+2 for safety/equipment;BSC/3in1 (stedy)   Toileting- Clothing Manipulation and Hygiene: Maximal assistance;+2 for physical assistance;+2 for safety/equipment;Sit to/from stand (stedy RN to perform peri care)       Functional mobility during ADLs:  (stedy only, unable to take steps) General ADL Comments: generalized weakness     Vision Patient Visual Report: No change from baseline Additional Comments: Pt states no visual deficits and she does focus otherwise, non-focal visual patterns today     Perception         Praxis         Pertinent Vitals/Pain Pain Assessment Pain Assessment: Faces Faces Pain Scale: Hurts whole lot Pain Location: L foot with touch Pain Descriptors / Indicators: Sharp, Tender Pain Intervention(s): Limited activity within patient's tolerance, Monitored during session, Repositioned     Extremity/Trunk Assessment Upper Extremity Assessment Upper Extremity Assessment: Right hand dominant;LUE deficits/detail LUE Deficits / Details: OLD L hemi, sensitive to touch. no movement noted throughout session at any joint, stiff, and when OT attempted to perform PROM at digits Pt grimaced. Pt states she does not have a cuff for at night. - might benefit   Lower Extremity Assessment Lower Extremity Assessment: Defer to PT evaluation       Communication Communication Communication: No apparent difficulties   Cognition Arousal: Alert Behavior During Therapy: Anxious, WFL for tasks assessed/performed Overall Cognitive Status: No family/caregiver present to determine baseline cognitive  functioning                                 General Comments: Pt generally pleasant and cooperative but mild confusion. No family present to determine baseline. unreliable historian.     General Comments  RN in room throughout and assist with peri care in standing. BP elevated 218/116 at end of session    Exercises     Shoulder Instructions      Home Living Family/patient expects to be discharged to:: Skilled nursing facility                                 Additional Comments: [facility]      Prior Functioning/Environment Prior Level of Function : Needs assist             Mobility Comments: Pt reports performing transfers mod I ADLs Comments: Pt reports that she does her own bathing and gets herself in and out of the shower - will need tio be confirmed by family or staff        OT Problem List: Decreased activity tolerance;Impaired balance (sitting and/or standing);Decreased cognition;Decreased safety awareness      OT Treatment/Interventions: Self-care/ADL training;DME and/or AE instruction;Manual therapy;Therapeutic activities;Cognitive remediation/compensation;Patient/family education;Balance training    OT Goals(Current goals can be found in the care plan section) Acute Rehab OT Goals Patient Stated Goal: get moving better,  decrease pain in LLE OT Goal Formulation: With patient Time For Goal Achievement: 09/22/23 Potential to Achieve Goals: Fair ADL Goals Pt Will Perform Grooming: with set-up;sitting Pt Will Perform Upper Body Dressing: with set-up;sitting Pt Will Perform Lower Body Dressing: with min assist;sitting/lateral leans Pt Will Transfer to Toilet: with mod assist;squat pivot transfer Pt Will Perform Toileting - Clothing Manipulation and hygiene: with contact guard assist;sitting/lateral leans Additional ADL Goal #1: Pt will perform bed mobility at supervision level prior to engaging in ADL  OT Frequency: Min 1X/week     Co-evaluation PT/OT/SLP Co-Evaluation/Treatment: Yes Reason for Co-Treatment: Necessary to address cognition/behavior during functional activity;For patient/therapist safety;To address functional/ADL transfers PT goals addressed during session: Mobility/safety with mobility;Proper use of DME;Balance;Strengthening/ROM OT goals addressed during session: ADL's and self-care;Strengthening/ROM      AM-PAC OT "6 Clicks" Daily Activity     Outcome Measure Help from another person eating meals?: A Little Help from another person taking care of personal grooming?: A Little Help from another person toileting, which includes using toliet, bedpan, or urinal?: A Lot Help from another person bathing (including washing, rinsing, drying)?: A Lot Help from another person to put on and taking off regular upper body clothing?: A Lot Help from another person to put on and taking off regular lower body clothing?: A Lot 6 Click Score: 14   End of Session Equipment Utilized During Treatment: Gait belt;Oxygen (stedy) Nurse Communication: Mobility status;Precautions  Activity Tolerance: Patient tolerated treatment well Patient left: in bed;with call bell/phone within reach;with bed alarm set;with nursing/sitter in room  OT Visit Diagnosis: Unsteadiness on feet (R26.81);Other abnormalities of gait and mobility (R26.89);Muscle weakness (generalized) (M62.81);Other symptoms and signs involving cognitive function;Pain Pain - Right/Left: Left Pain - part of body: Leg;Hand                Time: 2725-3664 OT Time Calculation (min): 30 min Charges:  OT General Charges $OT Visit: 1 Visit OT Evaluation $OT Eval Moderate Complexity: 1 Mod  Nyoka Cowden OTR/L Acute Rehabilitation Services Office: [phone]  Evern Bio [facility] 09/08/2023, 10:28 AM

## 2023-09-08 NOTE — Progress Notes (Signed)
[facility]                                                                               Mabelle Hultin, is a 52 y.o. female, DOB - 1971-06-04, YQM:[id number] Admit date - 09/06/2023    Outpatient Primary MD for the patient is Pcp, No  LOS - 2  days    Brief summary    Atasha C Orris is a 52 y.o. female with medical history significant of asthma, bilateral pulmonary embolism, stage IIIa CKD, history of AKI episode, history of COPD, chronic respiratory failure with hypoxia occasionally on oxygen, type 2 diabetes, hypertension, GERD, grade 1 diastolic dysfunction, hypertension, hypothyroidism, hyperlipidemia, history of CVA with left-sided hemiparesis who is sent from her facility due to altered mental status and hyperglycemia.   CT head without contrast with no acute or reversible finding. There are remote right cerebral infarcts.   Assessment & Plan    Assessment and Plan:  Acute metabolic encephalopathy:  - suspect from UTI and possible cellulitis of the left lower extremity.  - resolved.    Urinary tract infection:  On IV Meropenem, for her history of ESBL UTI's. She is on Macrobid at the LTC for the recurrent UTI'S.  Urine cultures show 1,00,000 gram negative rods.  Follow up on identification and sensitivities.     AKI on stage 3 a CKD Suspect from dehydration.  Admitted with a creatinine of 3.2, slowly improving with IV fluids.  Creatinine improved to 2.4 to 1.4 .   creatinine at baseline is around 1.5.     Elevated lactic acid Pt received 4 lit of Fluids.   From UTI, cellulitis.  Repeat lactic acid is 1.4    Uncontrolled DM with hyperglycemia:A1c is 10.3 CBG (last 3)  Recent Labs    09/08/23 0724 09/08/23 1101 09/08/23 1129  GLUCAP 209* 243* 229*   Increase Semglee TO 20 units BID.  Meanwhile continue with SSI.   Bilateral PE;  On eliquis resume the same.    Hyperlipidemia:  Resume home meds.    Hyperkalemia:  Resolved.    Left lower extremity swelling and redness  Cellulitis.  MRSA PCR is negative.  On IV antibiotics.   Hypothyroidism Resume synthroid 88 mcg daily.   Chronic respiratory failure with hypoxia secondary to COPD:  Continue with bronchodilators as needed.  Increased requirement today to 4 liters. Will repeat CXR for further evaluation.  Resume Dulera and Incruse.    Chronic diastolic CHF.  Last echo in 2022, with preserved LVEF and Grade 1 diastolic dysfunction.  Will check Echo today as she has increased oxygen requirement.  Leg edema.  Bnp ordered.    Hyperlipidemia:  Resume lipitor.    Uncontrolled Hypertension;  Restarted home meds.  Recheck BP later today.   Ongoing Tobacco Use: On nicotine patch.   Estimated body mass index is 34.15 kg/m as calculated from the following:   Height as of this encounter: 5\' 7"  (1.702 m).   Weight as of this encounter: 98.9 kg.  Code Status: full code.  DVT Prophylaxis:   apixaban (ELIQUIS) tablet 5 mg   Level of Care: Level of care: Stepdown Family Communication: none  at bedside.   Disposition Plan:     Remains inpatient appropriate:  IV antibiotics.   Procedures:  None.   Consultants:   none  Antimicrobials:   Anti-infectives (From admission, onward)    Start     Dose/Rate Route Frequency Ordered Stop   09/07/23 0600  meropenem (MERREM) 1 g in sodium chloride 0.9 % 100 mL IVPB        1 g 200 mL/hr over 30 Minutes Intravenous Every 12 hours 09/06/23 1707     09/06/23 1545  meropenem (MERREM) 1 g in sodium chloride 0.9 % 100 mL IVPB       Note to Pharmacy: Please adjust as needed.   1 g 200 mL/hr over 30 Minutes Intravenous  Once 09/06/23 1526 09/06/23 1759   09/06/23 1230  vancomycin (VANCOREADY) IVPB 2000 mg/400 mL        2,000 mg 200 mL/hr over 120 Minutes Intravenous  Once 09/06/23 1150 09/06/23 1726   09/06/23 1200  ceFEPIme (MAXIPIME) 2 g in sodium chloride 0.9 % 100 mL IVPB        2 g 200 mL/hr over 30  Minutes Intravenous  Once 09/06/23 1147 09/06/23 1344   09/06/23 1200  metroNIDAZOLE (FLAGYL) IVPB 500 mg        500 mg 100 mL/hr over 60 Minutes Intravenous  Once 09/06/23 1147 09/06/23 1452   09/06/23 1200  vancomycin (VANCOCIN) IVPB 1000 mg/200 mL premix  Status:  Discontinued        1,000 mg 200 mL/hr over 60 Minutes Intravenous  Once 09/06/23 1147 09/06/23 1150        Medications  Scheduled Meds:  acetaminophen  1,000 mg Oral Once   albuterol  2.5 mg Nebulization TID   apixaban  5 mg Oral BID   aspirin EC  81 mg Oral Daily   atenolol  50 mg Oral BID   atorvastatin  80 mg Oral QHS   Chlorhexidine Gluconate Cloth  6 each Topical Daily   ezetimibe  10 mg Oral QHS   FLUoxetine  20 mg Oral Daily   insulin aspart  0-15 Units Subcutaneous TID WC   insulin glargine-yfgn  20 Units Subcutaneous BID   levothyroxine  88 mcg Oral QAC breakfast   losartan  25 mg Oral Daily   magnesium oxide  400 mg Oral QODAY   mometasone-formoterol  2 puff Inhalation BID   nicotine  14 mg Transdermal Daily   mouth rinse  15 mL Mouth Rinse 4 times per day   pantoprazole  40 mg Oral Daily   umeclidinium bromide  1 puff Inhalation Daily   Continuous Infusions:  meropenem (MERREM) IV Stopped (09/08/23 0635)   PRN Meds:.acetaminophen **OR** acetaminophen, albuterol, hydrALAZINE, morphine injection, ondansetron **OR** ondansetron (ZOFRAN) IV, mouth rinse, traMADol    Subjective:   Chanika Tawil was seen and examined today. Feeling sob.   Objective:   Vitals:   09/08/23 1027 09/08/23 1100 09/08/23 1122 09/08/23 1200  BP:  (!) 193/89 (!) 193/89 (!) 177/57  Pulse: 88 86  87  Resp:    15  Temp: 98.6 F (37 C) 98.6 F (37 C)  99 F (37.2 C)  TempSrc:      SpO2: (!) 86% 95%  94%  Weight:      Height:        Intake/Output Summary (Last 24 hours) at 09/08/2023 1302 Last data filed at 09/08/2023 1200 Gross per 24 hour  Intake 220.07 ml  Output 4000 ml  Net -  3779.93 ml   Filed Weights    09/06/23 1655  Weight: 98.9 kg     Exam General exam: Appears calm and comfortable  Respiratory system: Clear to auscultation. Respiratory effort normal. Cardiovascular system: S1 & S2 heard, RRR. No JVD,  Gastrointestinal system: Abdomen is nondistended, soft and nontender.  Central nervous system: Alert and oriented. No focal neurological deficits. Extremities: Symmetric 5 x 5 power. Skin: No rashes,  Psychiatry: Mood & affect appropriate.     Data Reviewed:  I have personally reviewed following labs and imaging studies   CBC Lab Results  Component Value Date   WBC 9.3 09/08/2023   RBC 4.07 09/08/2023   HGB 11.7 (L) 09/08/2023   HCT 35.9 (L) 09/08/2023   MCV 88.2 09/08/2023   MCH 28.7 09/08/2023   PLT 193 09/08/2023   MCHC 32.6 09/08/2023   RDW 14.4 09/08/2023   LYMPHSABS 3.3 09/08/2023   MONOABS 0.8 09/08/2023   EOSABS 0.2 09/08/2023   BASOSABS 0.0 09/08/2023     Last metabolic panel Lab Results  Component Value Date   NA 134 (L) 09/08/2023   K 5.1 09/08/2023   CL 101 09/08/2023   CO2 27 09/08/2023   BUN 24 (H) 09/08/2023   CREATININE 1.44 (H) 09/08/2023   GLUCOSE 222 (H) 09/08/2023   GFRNONAA 44 (L) 09/08/2023   GFRAA >60 07/08/2020   CALCIUM 9.0 09/08/2023   PHOS 1.7 (L) 12/02/2021   PROT 5.7 (L) 09/08/2023   ALBUMIN 2.7 (L) 09/08/2023   BILITOT 0.5 09/08/2023   ALKPHOS 60 09/08/2023   AST 79 (H) 09/08/2023   ALT 29 09/08/2023   ANIONGAP 6 09/08/2023    CBG (last 3)  Recent Labs    09/08/23 0724 09/08/23 1101 09/08/23 1129  GLUCAP 209* 243* 229*      Coagulation Profile: No results for input(s): "INR", "PROTIME" in the last 168 hours.   Radiology Studies: VAS [place] LOWER EXTREMITY VENOUS (DVT)  Result Date: 09/07/2023  Lower Venous DVT Study Patient Name:  OBERA BLAYDES  Date of Exam:   09/07/2023 Medical Rec #: [id number]          Accession #:    [id number] Date of Birth: [birth year]          Patient Gender: F Patient Age:   79 years  Exam Location:  [facility] Procedure:      VAS [place] LOWER EXTREMITY VENOUS (DVT) Referring Phys: Kathlen Mody --------------------------------------------------------------------------------  Indications: Pain, and Swelling.  Risk Factors: PE & DVT 11/2019, decreased mobility due to past stroke. Limitations: Poor patient positioning - could not roll off left side., body habitus and poor ultrasound/tissue interface. Comparison Study: Previous exam on 11/16/2019 was positive for DVT in LLE FV. Performing Technologist: Ernestene Mention RVT, RDMS  Examination Guidelines: A complete evaluation includes B-mode imaging, spectral Doppler, color Doppler, and power Doppler as needed of all accessible portions of each vessel. Bilateral testing is considered an integral part of a complete examination. Limited examinations for reoccurring indications may be performed as noted. The reflux portion of the exam is performed with the patient in reverse Trendelenburg.  +-----+---------------+---------+-----------+----------+--------------+ RIGHTCompressibilityPhasicitySpontaneityPropertiesThrombus Aging +-----+---------------+---------+-----------+----------+--------------+ CFV  Full           Yes      Yes                                 +-----+---------------+---------+-----------+----------+--------------+   +---------+---------------+---------+-----------+----------+------------------+ LEFT  CompressibilityPhasicitySpontaneityPropertiesThrombus Aging     +---------+---------------+---------+-----------+----------+------------------+ CFV      Full           No       Yes        pulsatile                    +---------+---------------+---------+-----------+----------+------------------+ SFJ      Full                                                            +---------+---------------+---------+-----------+----------+------------------+ FV Prox  Full           Yes      Yes                                      +---------+---------------+---------+-----------+----------+------------------+ FV Mid   Full           Yes      Yes                                     +---------+---------------+---------+-----------+----------+------------------+ FV DistalFull           Yes      Yes                                     +---------+---------------+---------+-----------+----------+------------------+ PFV      Full                                                            +---------+---------------+---------+-----------+----------+------------------+ POP      Full           Yes      Yes                                     +---------+---------------+---------+-----------+----------+------------------+ PTV                                                   unable to insonate +---------+---------------+---------+-----------+----------+------------------+ PERO                                                  unable to insonate +---------+---------------+---------+-----------+----------+------------------+   Left Technical Findings: Not visualized segments include peroneal and posterior tibial veins.   Summary: RIGHT: - No evidence of common femoral vein obstruction.  LEFT: - There is no evidence of deep vein thrombosis in the lower extremity. However, portions of this examination were limited- see technologist comments above.  - No cystic structure found in the popliteal fossa.  *  See table(s) above for measurements and observations.    Preliminary    US RENAL  Result Date: 09/07/2023 CLINICAL DATA:  Acute kidney insufficiency EXAM: RENAL / URINARY TRACT ULTRASOUND COMPLETE COMPARISON:  CT 04/08/2023. FINDINGS: Right Kidney: Renal measurements: 7.1 x 3.4 x 4.3 cm = volume: 52.8 mL. Diffusely atrophic right kidney with some echogenicity of the parenchyma. No collecting system dilatation or perinephric fluid. Left Kidney: Renal measurements: 9.8 x 4.4 x 4.1 cm = volume: 92.0 mL.  Echogenicity within normal limits. No mass or hydronephrosis visualized. Bladder: Contracted with a Foley catheter. Other: Study limited by overlapping bowel gas and soft tissue. Echogenic appearing liver parenchyma as well IMPRESSION: No collecting system dilatation.  Small echogenic right kidney. Electronically Signed   By: Karen Kays M.D.   On: 09/07/2023 16:02       Kathlen Mody M.D. [facility] 09/08/2023, 1:02 PM  Available via Epic secure chat 7am-7pm After 7 pm, please refer to night coverage provider listed on amion.

## 2023-09-08 NOTE — Consult Note (Signed)
WOC Nurse Consult Note: Reason for Consult: DTPI nasal bridge from Bipap  Wound type: Deep Tissue Pressure Injury  Pressure Injury POA: NO  Measurement: 0.5 cm x 2 cm  Wound bed: purple maroon discoloration evolving to eschar  Drainage (amount, consistency, odor) none Periwound:  mild erythema  Dressing procedure/placement/frequency: Clean nasal bridge DTPI with NS, apply small piece of Xeroform [place] [phone]) cut to fit wound bed daily and secure with silicone foam.   If patient can not tolerate silicone foam on note, coat DTPI with Vaseline daily and leave open to air.    POC discussed with mother and bedside nurse. WOC team will follow every 7 to 10 days to assess PI and change plan of care as needed.   Thank you,     Priscella Mann MSN, RN-BC, [employer] [phone]

## 2023-09-08 NOTE — Evaluation (Signed)
Physical Therapy Evaluation Patient Details Name: Karla Hoover MRN: [id number] DOB: 07-05-1971 Today's Date: 09/08/2023  History of Present Illness  Karla Hoover is a 52 y.o. female Admitted from  [facility] SNF 09/[facility]/23 with AMS and hyperglycemia.  Dx with UTI/ ESBL. PMH includes asthma, bilateral PE, stage IIIa CKD, history of AKI episode, history of COPD, chronic respiratory failure with hypoxia occasionally on oxygen, type 2 diabetes, HTN, GERD, grade 1 diastolic dysfunction, hypothyroidism, hyperlipidemia, history of CVA with left-sided hemiparesis.  Clinical Impression  Pt admitted with above diagnosis.  Pt currently with functional limitations due to the deficits listed below (see PT Problem List). Pt will benefit from acute skilled PT to increase their independence and safety with mobility to allow discharge.   The patient  presents with  restlessness, reporting LLE pain to even LT, noted flexor withdrawal.  Patient reports that she transfers and bathes independently, unsure of reliability.of patient's function and level of assistance.  Patient assisted  to stand  multiple times  in STEDY, transfer to [facility] then back to bed via STEDY.  BP noted to be elevated, RN aware. SPO2 on 4 LNC > 90%.  Anticipate that patient will return to LTC.            If plan is discharge home, recommend the following: Two people to help with walking and/or transfers;A lot of help with bathing/dressing/bathroom   Can travel by private vehicle   No    Equipment Recommendations None recommended by PT  Recommendations for Other Services       Functional Status Assessment Patient has had a recent decline in their functional status and/or demonstrates limited ability to make significant improvements in function in a reasonable and predictable amount of time     Precautions / Restrictions Precautions Precautions: Fall Precaution Comments: L hemi, left leg pain to touch Restrictions Weight Bearing  Restrictions: No      Mobility  Bed Mobility Overal bed mobility: Needs Assistance Bed Mobility: Supine to Sit     Supine to sit: Mod assist, +2 for physical assistance, +2 for safety/equipment, HOB elevated, Used rails     General bed mobility comments: assist legs over bed edge, assist trunk to sitting, patient pushing up with right hand on rail. max to scoot to bed edge, total to return to supine.    Transfers Overall transfer level: Needs assistance   Transfers: Sit to/from Stand Sit to Stand: Mod assist, +2 safety/equipment, +2 physical assistance, Via lift equipment           General transfer comment: Pt sit<>stand x 5 from EOB and to<>from BSC. max multimodal cues and boost +2 mod A to achieve full upright from full seated position. When on stedy paddles, able to achieve standing with +2 min A. Pt only uses R hand to pull into standing. LUE by side Transfer via Lift Equipment: Stedy  Ambulation/Gait                  Stairs            Wheelchair Mobility     Tilt Bed    Modified Rankin (Stroke Patients Only)       Balance Overall balance assessment: Needs assistance Sitting-balance support: Single extremity supported, Feet supported Sitting balance-Leahy Scale: Fair Sitting balance - Comments: able to sit CGA EOB (ICU)   Standing balance support: Single extremity supported, During functional activity, Reliant on assistive device for balance Standing balance-Leahy Scale: Poor Standing balance comment: dependent  on support of stedy and therapy team                             Pertinent Vitals/Pain Pain Assessment Faces Pain Scale: Hurts whole lot Pain Location: L foot with touch, noted flexion reflex Pain Descriptors / Indicators: Sharp, Tender, Grimacing, Moaning Pain Intervention(s): Limited activity within patient's tolerance, Monitored during session    Home Living Family/patient expects to be discharged to:: Skilled  nursing facility                   Additional Comments: [facility]    Prior Function Prior Level of Function : Needs assist             Mobility Comments: Pt reports performing transfers mod I, treports gets in [facility] independnetly. ADLs Comments: Pt reports that she does her own bathing and gets herself in and out of the shower - will need tio be confirmed by family or staff     Extremity/Trunk Assessment   Upper Extremity Assessment Upper Extremity Assessment: Defer to OT evaluation LUE Deficits / Details: OLD L hemi, sensitive to touch. no movement noted throughout session at any joint, stiff, and when OT attempted to perform PROM at digits Pt grimaced. Pt states she does not have a cuff for at night. - might benefit    Lower Extremity Assessment Lower Extremity Assessment: LLE deficits/detail LLE Deficits / Details: noted skin discoloration, sore, flexor reflex when leg is touched, no active  movement, cannot move out of flexion, no ankle active movement    Cervical / Trunk Assessment Cervical / Trunk Assessment: Normal  Communication   Communication Communication: No apparent difficulties  Cognition Arousal: Alert Behavior During Therapy: Anxious, WFL for tasks assessed/performed Overall Cognitive Status: No family/caregiver present to determine baseline cognitive functioning                                 General Comments: Pt generally pleasant and cooperative but mild confusion. No family present to determine baseline. unreliable historian. Oriented to hospital and resides at [facility] Comments General comments (skin integrity, edema, etc.): RN in room throughout and assist with peri care in standing. BP elevated 218/116 at end of session    Exercises     Assessment/Plan    PT Assessment Patient needs continued PT services  PT Problem List Decreased mobility;Decreased range of motion;Obesity;Impaired tone;Decreased  activity tolerance;Pain;Decreased cognition;Cardiopulmonary status limiting activity;Decreased balance       PT Treatment Interventions DME instruction;Therapeutic activities;Cognitive remediation;Patient/family education;Balance training;Functional mobility training    PT Goals (Current goals can be found in the Care Plan section)  Acute Rehab PT Goals PT Goal Formulation: Patient unable to participate in goal setting Time For Goal Achievement: 09/22/23 Potential to Achieve Goals: Fair    Frequency Min 1X/week     Co-evaluation PT/OT/SLP Co-Evaluation/Treatment: Yes Reason for Co-Treatment: Necessary to address cognition/behavior during functional activity;For patient/therapist safety;To address functional/ADL transfers PT goals addressed during session: Mobility/safety with mobility;Proper use of DME;Balance;Strengthening/ROM OT goals addressed during session: ADL's and self-care;Strengthening/ROM       AM-PAC PT "6 Clicks" Mobility  Outcome Measure Help needed turning from your back to your side while in a flat bed without using bedrails?: Total Help needed moving from lying on your back to sitting on the side of a flat bed  without using bedrails?: Total Help needed moving to and from a bed to a chair (including a wheelchair)?: Total Help needed standing up from a chair using your arms (e.g., wheelchair or bedside chair)?: Total Help needed to walk in hospital room?: Total Help needed climbing 3-5 steps with a railing? : Total 6 Click Score: 6    End of Session Equipment Utilized During Treatment: Gait belt Activity Tolerance: Patient tolerated treatment well Patient left: in bed;with call bell/phone within reach;with nursing/sitter in room;with bed alarm set Nurse Communication: Mobility status;Need for lift equipment PT Visit Diagnosis: Other symptoms and signs involving the nervous system (R29.898)    Time: 0981-1914 PT Time Calculation (min) (ACUTE ONLY): 30  min   Charges:   PT Evaluation $PT Eval Low Complexity: 1 Low   PT General Charges $$ ACUTE PT VISIT: 1 Visit         Blanchard Kelch PT Acute Rehabilitation Services Office [phone] Weekend pager-[phone]   Rada Hay 09/08/2023, 12:21 PM

## 2023-09-08 NOTE — TOC Initial Note (Signed)
Transition of Care [facility]) - Initial/Assessment Note    Patient Details  Name: Karla Hoover MRN: [id number] Date of Birth: 25-Oct-1970  [facility] of [facility]) CM/SW Contact:    Lanier Clam, RN Phone Number: 09/08/2023, 4:19 PM  Clinical Narrative: Confirmed from [employer] rep aware of return once stable. PTAR @ d/c.                  Expected Discharge Plan: Long Term Nursing Home Barriers to Discharge: Continued Medical Work up   Patient Goals and CMS Choice            Expected Discharge Plan and Services                                              Prior Living Arrangements/Services                       Activities of Daily Living   ADL Screening (condition at time of admission) Independently performs ADLs?: No Does the patient have a NEW difficulty with bathing/dressing/toileting/self-feeding that is expected to last >3 days?: No Does the patient have a NEW difficulty with getting in/out of bed, walking, or climbing stairs that is expected to last >3 days?: No Does the patient have a NEW difficulty with communication that is expected to last >3 days?: No Is the patient deaf or have difficulty hearing?: No Does the patient have difficulty seeing, even when wearing glasses/contacts?: Yes Does the patient have difficulty concentrating, remembering, or making decisions?: Yes  Permission Sought/Granted                  Emotional Assessment              Admission diagnosis:  Acidosis [E87.20] Hyperkalemia [E87.5] AKI (acute kidney injury) (HCC) [N17.9] UTI due to extended-spectrum beta lactamase (ESBL) producing Escherichia coli [N39.0, B96.29, Z16.12] Sepsis with acute hypoxic respiratory failure without septic shock, due to unspecified organism (HCC) [A41.9, R65.20, J96.01] Patient Active Problem List   Diagnosis Date Noted   UTI due to extended-spectrum beta lactamase (ESBL) producing Escherichia coli 09/06/2023   Acute  metabolic encephalopathy 09/06/2023   Acute on chronic respiratory failure with hypoxia and hypercapnia (HCC) 09/06/2023   Hypotension 09/06/2023   UTI (urinary tract infection) 01/21/2023   Leukocytosis 01/21/2023   Class 1 obesity due to excess calories with body mass index (BMI) of 32.0 to 32.9 in adult 12/18/2022   Dyslipidemia 12/18/2022   Sepsis due to urinary tract infection (HCC) 12/13/2022   Hyponatremia 12/13/2022   CKD (chronic kidney disease) stage 3, GFR 30-59 ml/min (HCC) 12/13/2022   Sepsis (HCC) 09/07/2022   AKI (acute kidney injury) (HCC) 08/30/2022   History of pulmonary embolism 08/30/2022   Hemiparesis affecting left side as late effect of cerebrovascular accident (CVA) (HCC) 08/30/2022   Allergies    Grade I diastolic dysfunction 12/02/2021   Mild protein-calorie malnutrition (HCC) 12/02/2021   COPD with acute exacerbation (HCC) 12/02/2021   Acute renal failure superimposed on stage 3a chronic kidney disease (HCC) 02/09/2021   Chronic respiratory failure with hypoxia (HCC) 02/09/2021   History of thromboembolism 02/09/2021   Type 2 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 disease, with long-term current use of insulin (HCC) 02/09/2021   Mixed 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 (HCC) 02/09/2021   GERD  without esophagitis 02/09/2021   Hypothyroidism 02/09/2021   Sepsis secondary to UTI (HCC) 02/08/2021   Lymphadenopathy 12/28/2020   hx of PE/DVT 12/01/2019   Overweight (BMI 25.0-29.9) 11/22/2019   Tobacco abuse 11/16/2019   DM (diabetes mellitus), type 2 (HCC) 11/15/2019   History of stroke 11/15/2019   Essential hypertension 05/04/2009   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 [place] [address] Phone: [phone] Fax: [phone]     Social Determinants of Health (SDOH) Social History: SDOH Screenings   Food  Insecurity: No Food Insecurity (09/06/2023)  Housing: Low Risk  (09/06/2023)  Transportation Needs: No Transportation Needs (09/06/2023)  Utilities: Not At Risk (09/06/2023)  Depression (PHQ2-9): Medium Risk (10/04/2019)  Financial Resource Strain: Not on File (08/17/2018)   Received from [place], [place]  Physical Activity: Not on File (08/17/2018)   Received from [place], [place]  Social Connections: Not on File (06/28/2023)   [facility]  Stress: Not on File (08/17/2018)   Received from [place], [place]  Tobacco Use: Medium Risk (09/06/2023)   SDOH Interventions:     Readmission Risk Interventions    01/21/2023   12:08 PM 12/18/2022   11:10 AM 01/04/2022    2:25 PM  Readmission Risk Prevention Plan  Transportation Screening Complete Complete Complete  PCP or Specialist Appt within 5-7 Days Complete    PCP or Specialist Appt within 3-5 Days  Complete Complete  Home Care Screening Complete    Medication Review (RN CM) Complete    HRI or Home Care Consult  Complete Complete  Social Work Consult for Recovery Care Planning/Counseling  Complete   Palliative Care Screening  Not Applicable Not Applicable  Medication Review Oceanographer)  Complete Complete

## 2023-09-08 NOTE — Progress Notes (Signed)
Pharmacy Antibiotic Note  Karla Hoover is a 52 y.o. female admitted on 09/06/2023 with Ecoli UTI. Pt has previously grown ESBL+ E.coli in urine culture in April 2024.  Pharmacy has been consulted for meropenem dosing.  Today, 09/08/23:  AKI with SCr peak at 3.22 (11/30), currently down to 1.44. Baseline appears to be ~1.2  Plan: Increase to Meropenem 1 g IV q8h Follow up renal function, culture results, and clinical course.   Height: 5\' 7"  (170.2 cm) Weight: 98.9 kg (218 lb 0.6 oz) IBW/kg (Calculated) : 61.6  Temp (24hrs), Avg:99.1 F (37.3 C), Min:98.4 F (36.9 C), Max:99.9 F (37.7 C)  Recent Labs  Lab 09/06/23 1158 09/06/23 1210 09/06/23 1354 09/06/23 1656 09/06/23 1950 09/06/23 2156 09/07/23 0313 09/08/23 0316 09/08/23 0539  WBC 15.5*  --   --   --   --   --  10.4 9.3  --   CREATININE 3.22*  --   --  3.07*  --  2.81* 2.43*  --  1.44*  LATICACIDVEN  --  2.1* 1.1  --  2.9*  --   --  1.4  --     Estimated Creatinine Clearance: 55.8 mL/min (A) (by C-G formula based on SCr of 1.44 mg/dL (H)).    Allergies  Allergen Reactions   Cipro [Ciprofloxacin Hcl] Hives   Guaifenesin Anaphylaxis   Kiwi Extract Anaphylaxis and Rash   Levaquin [Levofloxacin] Shortness Of Breath and Rash   Strawberry Extract Anaphylaxis and Rash   Lioresal [Baclofen] Other (See Comments)    Near syncope/ fall    Antimicrobials this admission: 11/30 Cefepime, vancomycin, metronidazole x1 in ED 11/30 meropenem >>  Dose adjustments this admission:  Microbiology results: 11/30 BCx: ngtd 11/30 UCx:  >100k Ecoli (sens pending) 11/30 MRSA PCR: neg   Lynann Beaver PharmD, BCPS [facility] main pharmacy [phone] 09/08/2023 1:54 PM

## 2023-09-08 NOTE — Plan of Care (Signed)
Patient more alert during this shift. Unable to receive education due to confusion. Mother at bedside

## 2023-09-08 NOTE — NC FL2 (Signed)
[place] MEDICAID FL2 LEVEL OF CARE FORM     IDENTIFICATION  Patient Name: Karla Hoover Birthdate: [birth year] Sex: female Admission Date (Current Location): 09/06/2023  [place] and [place] Number:  [facility] [id number] K Facility and Address:         Provider Number: [id number]  Attending Physician Name and Address:  Kathlen Mody, MD  Relative Name and Phone Number:  Letta Median) #[phone]    Current Level of Care: Hospital Recommended Level of Care: Other (Comment), Nursing Facility Prior Approval Number:    Date Approved/Denied:   PASRR Number:    Discharge Plan: SNF    Current Diagnoses: Patient Active Problem List   Diagnosis Date Noted   UTI due to extended-spectrum beta lactamase (ESBL) producing Escherichia coli 09/06/2023   Acute metabolic encephalopathy 09/06/2023   Acute on chronic respiratory failure with hypoxia and hypercapnia (HCC) 09/06/2023   Hypotension 09/06/2023   UTI (urinary tract infection) 01/21/2023   Leukocytosis 01/21/2023   Class 1 obesity due to excess calories with body mass index (BMI) of 32.0 to 32.9 in adult 12/18/2022   Dyslipidemia 12/18/2022   Sepsis due to urinary tract infection (HCC) 12/13/2022   Hyponatremia 12/13/2022   CKD (chronic kidney disease) stage 3, GFR 30-59 ml/min (HCC) 12/13/2022   Sepsis (HCC) 09/07/2022   AKI (acute kidney injury) (HCC) 08/30/2022   History of pulmonary embolism 08/30/2022   Hemiparesis affecting left side as late effect of cerebrovascular accident (CVA) (HCC) 08/30/2022   Allergies    Grade I diastolic dysfunction 12/02/2021   Mild protein-calorie malnutrition (HCC) 12/02/2021   COPD with acute exacerbation (HCC) 12/02/2021   Acute renal failure superimposed on stage 3a chronic kidney disease (HCC) 02/09/2021   Chronic respiratory failure with hypoxia (HCC) 02/09/2021   History of thromboembolism 02/09/2021   Type 2 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 disease,  with long-term current use of insulin (HCC) 02/09/2021   Mixed 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 (HCC) 02/09/2021   GERD without esophagitis 02/09/2021   Hypothyroidism 02/09/2021   Sepsis secondary to UTI (HCC) 02/08/2021   Lymphadenopathy 12/28/2020   hx of PE/DVT 12/01/2019   Overweight (BMI 25.0-29.9) 11/22/2019   Tobacco abuse 11/16/2019   DM (diabetes mellitus), type 2 (HCC) 11/15/2019   History of stroke 11/15/2019   Essential hypertension 05/04/2009   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    Orientation RESPIRATION BLADDER Height & Weight     Self, Time, Situation, Place  Normal Incontinent Weight: 98.9 kg Height:  5\' 7"  (170.2 cm)  BEHAVIORAL SYMPTOMS/MOOD NEUROLOGICAL BOWEL NUTRITION STATUS      Incontinent Diet (Heart Healthy)  AMBULATORY STATUS COMMUNICATION OF NEEDS Skin   Extensive Assist Verbally Normal                       Personal Care Assistance Level of Assistance  Bathing, Feeding, Dressing, Total care Bathing Assistance: Maximum assistance Feeding assistance: Limited assistance Dressing Assistance: Maximum assistance Total Care Assistance: Maximum assistance   Functional Limitations Info  Sight, Hearing Sight Info: Adequate Hearing Info: Adequate      SPECIAL CARE FACTORS FREQUENCY  PT (By licensed PT), OT (By licensed OT)     PT Frequency: 2x week OT Frequency: 2x week            Contractures Contractures Info: Not present    Additional Factors Info  Code Status, Allergies Code Status Info: Full Allergies Info: Cipro (Ciprofloxacin Hcl), Guaifenesin, Kiwi  Extract, Levaquin (Levofloxacin), Strawberry Extract, Lioresal (Baclofen)           Current Medications (09/08/2023):  This is the current hospital active medication list Current Facility-Administered Medications  Medication Dose Route Frequency Provider Last Rate Last Admin   acetaminophen (TYLENOL) tablet 650 mg  650 mg Oral Q6H PRN  Bobette Mo, MD   650 mg at 09/08/23 1222   Or   acetaminophen (TYLENOL) suppository 650 mg  650 mg Rectal Q6H PRN Bobette Mo, MD       acetaminophen (TYLENOL) tablet 1,000 mg  1,000 mg Oral Once Upstill, Shari, PA-C       albuterol (PROVENTIL) (2.5 MG/3ML) 0.083% nebulizer solution 2.5 mg  2.5 mg Nebulization Q4H PRN Bobette Mo, MD       albuterol (PROVENTIL) (2.5 MG/3ML) 0.083% nebulizer solution 2.5 mg  2.5 mg Nebulization TID Kathlen Mody, MD   2.5 mg at 09/08/23 1346   apixaban (ELIQUIS) tablet 5 mg  5 mg Oral BID Bobette Mo, MD   5 mg at 09/08/23 1110   aspirin EC tablet 81 mg  81 mg Oral Daily Bobette Mo, MD   81 mg at 09/08/23 1110   atenolol (TENORMIN) tablet 50 mg  50 mg Oral BID Kathlen Mody, MD   50 mg at 09/08/23 1221   atorvastatin (LIPITOR) tablet 80 mg  80 mg Oral QHS Bobette Mo, MD   80 mg at 09/07/23 2200   Chlorhexidine Gluconate Cloth 2 % PADS 6 each  6 each Topical Daily Bobette Mo, MD   6 each at 09/08/23 1215   ezetimibe (ZETIA) tablet 10 mg  10 mg Oral QHS Bobette Mo, MD   10 mg at 09/07/23 2200   FLUoxetine (PROZAC) capsule 20 mg  20 mg Oral Daily Bobette Mo, MD   20 mg at 09/08/23 1110   hydrALAZINE (APRESOLINE) injection 10 mg  10 mg Intravenous Q6H PRN Kathlen Mody, MD       insulin aspart (novoLOG) injection 0-15 Units  0-15 Units Subcutaneous TID WC Bobette Mo, MD   10 Units at 09/08/23 1108   insulin glargine-yfgn (SEMGLEE) injection 20 Units  20 Units Subcutaneous BID Kathlen Mody, MD       levothyroxine (SYNTHROID) tablet 88 mcg  88 mcg Oral QAC breakfast Bobette Mo, MD   88 mcg at 09/08/23 1610   magnesium oxide (MAG-OX) tablet 400 mg  400 mg Oral Lamont Snowball, MD   400 mg at 09/07/23 0959   meropenem (MERREM) 1 g in sodium chloride 0.9 % 100 mL IVPB  1 g Intravenous Q8H Shade, Jacqulyn Cane, RPH       mometasone-formoterol (DULERA) 100-5 MCG/ACT  inhaler 2 puff  2 puff Inhalation BID Bobette Mo, MD   2 puff at 09/08/23 0911   morphine (PF) 2 MG/ML injection 1-2 mg  1-2 mg Intravenous Q4H PRN Kathlen Mody, MD   2 mg at 09/08/23 1027   nicotine (NICODERM CQ - dosed in mg/24 hours) patch 14 mg  14 mg Transdermal Daily Kathlen Mody, MD   14 mg at 09/08/23 1110   ondansetron (ZOFRAN) tablet 4 mg  4 mg Oral Q6H PRN Bobette Mo, MD       Or   ondansetron [facility]) injection 4 mg  4 mg Intravenous Q6H PRN Bobette Mo, MD       Oral care mouth rinse  15 mL Mouth Rinse 4  times per day Bobette Mo, MD   15 mL at 09/08/23 1114   Oral care mouth rinse  15 mL Mouth Rinse PRN Bobette Mo, MD       pantoprazole (PROTONIX) EC tablet 40 mg  40 mg Oral Daily Bobette Mo, MD   40 mg at 09/08/23 1110   traMADol (ULTRAM) tablet 50 mg  50 mg Oral Q12H PRN Kathlen Mody, MD   50 mg at 09/07/23 2200   umeclidinium bromide (INCRUSE ELLIPTA) 62.5 MCG/ACT 1 puff  1 puff Inhalation Daily Bobette Mo, MD   1 puff at 09/08/23 0911     Discharge Medications: Please see discharge summary for a list of discharge medications.  Relevant Imaging Results:  Relevant Lab Results:   Additional Information SSN: [id number]  Lanier Clam, RN

## 2023-09-08 NOTE — Progress Notes (Signed)
  Echocardiogram 2D Echocardiogram has been performed.  Leda Roys RDCS 09/08/2023, 3:35 PM

## 2023-09-09 DIAGNOSIS — N179 Acute kidney failure, unspecified: Secondary | ICD-10-CM | POA: Diagnosis not present

## 2023-09-09 DIAGNOSIS — I10 Essential (primary) hypertension: Secondary | ICD-10-CM | POA: Diagnosis not present

## 2023-09-09 DIAGNOSIS — J4489 Other specified chronic obstructive pulmonary disease: Secondary | ICD-10-CM | POA: Diagnosis not present

## 2023-09-09 DIAGNOSIS — E66811 Obesity, class 1: Secondary | ICD-10-CM

## 2023-09-09 DIAGNOSIS — Z720 Tobacco use: Secondary | ICD-10-CM

## 2023-09-09 DIAGNOSIS — N390 Urinary tract infection, site not specified: Secondary | ICD-10-CM | POA: Diagnosis not present

## 2023-09-09 DIAGNOSIS — E6609 Other obesity due to excess calories: Secondary | ICD-10-CM

## 2023-09-09 DIAGNOSIS — Z6832 Body mass index (BMI) 32.0-32.9, adult: Secondary | ICD-10-CM

## 2023-09-09 LAB — BASIC METABOLIC PANEL
Anion gap: 9 (ref 5–15)
BUN: 17 mg/dL (ref 6–20)
CO2: 27 mmol/L (ref 22–32)
Calcium: 9.5 mg/dL (ref 8.9–10.3)
Chloride: 95 mmol/L — ABNORMAL LOW (ref 98–111)
Creatinine, Ser: 0.94 mg/dL (ref 0.44–1.00)
GFR, Estimated: >60 mL/min (ref >60)
Glucose, Bld: 207 mg/dL — ABNORMAL HIGH (ref 70–99)
Potassium: 4.5 mmol/L (ref 3.5–5.1)
Sodium: 131 mmol/L — ABNORMAL LOW (ref 135–145)

## 2023-09-09 LAB — CBC WITH DIFFERENTIAL/PLATELET
Abs Immature Granulocytes: 0.04 10*3/uL (ref 0.00–0.07)
Basophils Absolute: 0 10*3/uL (ref 0.0–0.1)
Basophils Relative: 0 %
Eosinophils Absolute: 0.3 10*3/uL (ref 0.0–0.5)
Eosinophils Relative: 2 %
HCT: 41.3 % (ref 36.0–46.0)
Hemoglobin: 13.3 g/dL (ref 12.0–15.0)
Immature Granulocytes: 0 %
Lymphocytes Relative: 32 %
Lymphs Abs: 3.4 10*3/uL (ref 0.7–4.0)
MCH: 27.7 pg (ref 26.0–34.0)
MCHC: 32.2 g/dL (ref 30.0–36.0)
MCV: 86 fL (ref 80.0–100.0)
Monocytes Absolute: 0.7 10*3/uL (ref 0.1–1.0)
Monocytes Relative: 7 %
Neutro Abs: 6.2 10*3/uL (ref 1.7–7.7)
Neutrophils Relative %: 59 %
Platelets: 244 10*3/uL (ref 150–400)
RBC: 4.8 MIL/uL (ref 3.87–5.11)
RDW: 14 % (ref 11.5–15.5)
WBC: 10.7 10*3/uL — ABNORMAL HIGH (ref 4.0–10.5)
nRBC: 0 % (ref 0.0–0.2)

## 2023-09-09 LAB — GLUCOSE, CAPILLARY
Glucose-Capillary: 190 mg/dL — ABNORMAL HIGH (ref 70–99)
Glucose-Capillary: 209 mg/dL — ABNORMAL HIGH (ref 70–99)
Glucose-Capillary: 149 mg/dL — ABNORMAL HIGH (ref 70–99)
Glucose-Capillary: 145 mg/dL — ABNORMAL HIGH (ref 70–99)

## 2023-09-09 MED ORDER — QUETIAPINE FUMARATE 25 MG PO TABS
25.0000 mg | ORAL_TABLET | Freq: Every day | ORAL | Status: DC
  Administered 2023-09-09 (×2): 25 mg via ORAL
  Filled 2023-09-09 (×3): qty 1

## 2023-09-09 MED ORDER — ROPINIROLE HCL 0.25 MG PO TABS: 0.2500 mg | ORAL_TABLET | Freq: Every day | ORAL | Status: DC

## 2023-09-09 MED ORDER — QUETIAPINE FUMARATE 25 MG PO TABS: 25.0000 mg | ORAL_TABLET | Freq: Every day | ORAL | Status: DC

## 2023-09-09 MED ORDER — ROPINIROLE HCL 0.25 MG PO TABS
0.2500 mg | ORAL_TABLET | Freq: Every day | ORAL | Status: DC
  Administered 2023-09-09 (×2): 0.25 mg via ORAL
  Filled 2023-09-09 (×2): qty 1

## 2023-09-09 NOTE — Progress Notes (Signed)
   09/09/23 2344  BiPAP/CPAP/SIPAP  BiPAP/CPAP/SIPAP Pt Type Adult  Reason BIPAP/CPAP not in use Non-compliant (PT refused bpap, said she can not tollerate)

## 2023-09-09 NOTE — Plan of Care (Signed)
No acute events overnight. Problem: Fluid Volume: Goal: Hemodynamic stability will improve Outcome: Progressing   Problem: Clinical Measurements: Goal: Diagnostic test results will improve Outcome: Progressing Goal: Signs and symptoms of infection will decrease Outcome: Progressing   Problem: Respiratory: Goal: Ability to maintain adequate ventilation will improve Outcome: Progressing   Problem: Education: Goal: Ability to describe self-care measures that may prevent or decrease complications (Diabetes Survival Skills Education) will improve Outcome: Progressing Goal: Individualized Educational Video(s) Outcome: Progressing   Problem: Coping: Goal: Ability to adjust to condition or change in health will improve Outcome: Progressing   Problem: Fluid Volume: Goal: Ability to maintain a balanced intake and output will improve Outcome: Progressing   Problem: Health Behavior/Discharge Planning: Goal: Ability to identify and utilize available resources and services will improve Outcome: Progressing Goal: Ability to manage health-related needs will improve Outcome: Progressing   Problem: Metabolic: Goal: Ability to maintain appropriate glucose levels will improve Outcome: Progressing   Problem: Nutritional: Goal: Maintenance of adequate nutrition will improve Outcome: Progressing Goal: Progress toward achieving an optimal weight will improve Outcome: Progressing   Problem: Skin Integrity: Goal: Risk for impaired skin integrity will decrease Outcome: Progressing   Problem: Tissue Perfusion: Goal: Adequacy of tissue perfusion will improve Outcome: Progressing   Problem: Education: Goal: Knowledge of General Education information will improve Description: Including pain rating scale, medication(s)/side effects and non-pharmacologic comfort measures Outcome: Progressing   Problem: Health Behavior/Discharge Planning: Goal: Ability to manage health-related needs will  improve Outcome: Progressing   Problem: Clinical Measurements: Goal: Ability to maintain clinical measurements within normal limits will improve Outcome: Progressing Goal: Will remain free from infection Outcome: Progressing Goal: Diagnostic test results will improve Outcome: Progressing Goal: Respiratory complications will improve Outcome: Progressing Goal: Cardiovascular complication will be avoided Outcome: Progressing   Problem: Activity: Goal: Risk for activity intolerance will decrease Outcome: Progressing   Problem: Nutrition: Goal: Adequate nutrition will be maintained Outcome: Progressing   Problem: Coping: Goal: Level of anxiety will decrease Outcome: Progressing   Problem: Elimination: Goal: Will not experience complications related to bowel motility Outcome: Progressing Goal: Will not experience complications related to urinary retention Outcome: Progressing   Problem: Pain Management: Goal: General experience of comfort will improve Outcome: Progressing   Problem: Safety: Goal: Ability to remain free from injury will improve Outcome: Progressing   Problem: Skin Integrity: Goal: Risk for impaired skin integrity will decrease Outcome: Progressing

## 2023-09-09 NOTE — Plan of Care (Signed)
  Problem: Fluid Volume: Goal: Hemodynamic stability will improve Outcome: Progressing   Problem: Clinical Measurements: Goal: Diagnostic test results will improve Outcome: Progressing Goal: Signs and symptoms of infection will decrease Outcome: Progressing   Problem: Respiratory: Goal: Ability to maintain adequate ventilation will improve Outcome: Progressing   Problem: Education: Goal: Ability to describe self-care measures that may prevent or decrease complications (Diabetes Survival Skills Education) will improve Outcome: Progressing Goal: Individualized Educational Video(s) Outcome: Progressing   Problem: Coping: Goal: Ability to adjust to condition or change in health will improve Outcome: Progressing

## 2023-09-09 NOTE — Progress Notes (Signed)
[facility]                                                                               Karla Hoover, is a 52 y.o. female, DOB - 1971/05/11, SWF:[id number] Admit date - 09/06/2023    Outpatient Primary MD for the patient is Pcp, No  LOS - 3  days    Brief summary    Karla Hoover is a 52 y.o. female with medical history significant of asthma, bilateral pulmonary embolism, stage IIIa CKD, history of AKI episode, history of COPD, chronic respiratory failure with hypoxia occasionally on oxygen, type 2 diabetes, hypertension, GERD, grade 1 diastolic dysfunction, hypertension, hypothyroidism, hyperlipidemia, history of CVA with left-sided hemiparesis who is sent from her facility due to altered mental status and hyperglycemia.   CT head without contrast with no acute or reversible finding. There are remote right cerebral infarcts.   Assessment & Plan    Assessment and Plan:  Acute metabolic encephalopathy:  - suspect from UTI and possible cellulitis of the left lower extremity.  - resolved.    Urinary tract infection:  On IV Meropenem, for her history of ESBL UTI's.  She is on Macrobid Daily at bedside for chronic suppression at the LTC for the recurrent UTI'S.  Urine cultures show 1,00,000  ESBL E coli UTI. Possible discharge in am with Macrobid 100 mg BID.    AKI on stage 3 a CKD Suspect from dehydration.  Admitted with a creatinine of 3.2, improved and back to baseline with IV fluids.    Elevated lactic acid Resolved. From UTI, cellulitis.  Repeat lactic acid is 1.4    Uncontrolled DM with hyperglycemia: A1c is 10.3 CBG (last 3)  Recent Labs    09/09/23 0733 09/09/23 1211 09/09/23 1625  GLUCAP 190* 209* 149*   Increase Semglee TO 20 units BID.  Meanwhile continue with SSI.   Bilateral PE;  On eliquis resume the same.    Hyperlipidemia:  Resume home meds.    Hyperkalemia:  Resolved.   Left lower extremity swelling and redness   Cellulitis.  MRSA PCR is negative.  Improving. Tenderness improved.  Venous duplex of the lower extremities is negative for DVT.    Hypothyroidism Resume synthroid 88 mcg daily.   Chronic respiratory failure with hypoxia secondary to COPD:  Continue with bronchodilators as needed.  She is back on [place] oxygen and CXR is negative for active disease.   Resume Dulera and Incruse.    Chronic diastolic CHF.  Last echo in 2022, with preserved LVEF and Grade 1 diastolic dysfunction.  Echocardiogram reviewed. No changes in meds.    Hyperlipidemia:  Resume lipitor.    Uncontrolled Hypertension;  Well controlled.    Ongoing Tobacco Use: On nicotine patch.    Hyponatremia:  Mild and asymptomatic.   Estimated body mass index is 34.15 kg/m as calculated from the following:   Height as of this encounter: 5\' 7"  (1.702 m).   Weight as of this encounter: 98.9 kg.  Code Status: full code.  DVT Prophylaxis:   apixaban (ELIQUIS) tablet 5 mg   Level of Care: Level of care: Telemetry Family Communication: none at bedside.  Disposition Plan:     Remains inpatient appropriate:possible discharge back to LTC in am with oral antibiotics.    Procedures:  Echocardiogram.   Consultants:   none  Antimicrobials:   Anti-infectives (From admission, onward)    Start     Dose/Rate Route Frequency Ordered Stop   09/08/23 1400  meropenem (MERREM) 1 g in sodium chloride 0.9 % 100 mL IVPB        1 g 200 mL/hr over 30 Minutes Intravenous Every 8 hours 09/08/23 1358     09/07/23 0600  meropenem (MERREM) 1 g in sodium chloride 0.9 % 100 mL IVPB  Status:  Discontinued        1 g 200 mL/hr over 30 Minutes Intravenous Every 12 hours 09/06/23 1707 09/08/23 1358   09/06/23 1545  meropenem (MERREM) 1 g in sodium chloride 0.9 % 100 mL IVPB       Note to Pharmacy: Please adjust as needed.   1 g 200 mL/hr over 30 Minutes Intravenous  Once 09/06/23 1526 09/06/23 1759   09/06/23 1230  vancomycin  (VANCOREADY) IVPB 2000 mg/400 mL        2,000 mg 200 mL/hr over 120 Minutes Intravenous  Once 09/06/23 1150 09/06/23 1726   09/06/23 1200  ceFEPIme (MAXIPIME) 2 g in sodium chloride 0.9 % 100 mL IVPB        2 g 200 mL/hr over 30 Minutes Intravenous  Once 09/06/23 1147 09/06/23 1344   09/06/23 1200  metroNIDAZOLE (FLAGYL) IVPB 500 mg        500 mg 100 mL/hr over 60 Minutes Intravenous  Once 09/06/23 1147 09/06/23 1452   09/06/23 1200  vancomycin (VANCOCIN) IVPB 1000 mg/200 mL premix  Status:  Discontinued        1,000 mg 200 mL/hr over 60 Minutes Intravenous  Once 09/06/23 1147 09/06/23 1150        Medications  Scheduled Meds:  albuterol  2.5 mg Nebulization TID   apixaban  5 mg Oral BID   aspirin EC  81 mg Oral Daily   atenolol  50 mg Oral BID   atorvastatin  80 mg Oral QHS   Chlorhexidine Gluconate Cloth  6 each Topical Daily   ezetimibe  10 mg Oral QHS   FLUoxetine  20 mg Oral Daily   insulin aspart  0-15 Units Subcutaneous TID WC   insulin glargine-yfgn  20 Units Subcutaneous BID   levothyroxine  88 mcg Oral QAC breakfast   magnesium oxide  400 mg Oral QODAY   mometasone-formoterol  2 puff Inhalation BID   nicotine  14 mg Transdermal Daily   mouth rinse  15 mL Mouth Rinse 4 times per day   pantoprazole  40 mg Oral Daily   QUEtiapine  25 mg Oral QHS   rOPINIRole  0.25 mg Oral QHS   umeclidinium bromide  1 puff Inhalation Daily   Continuous Infusions:  meropenem (MERREM) IV 1 g (09/09/23 1420)   PRN Meds:.acetaminophen **OR** acetaminophen, albuterol, hydrALAZINE, morphine injection, ondansetron **OR** ondansetron (ZOFRAN) IV, mouth rinse, traMADol    Subjective:   Karla Hoover was seen and examined today. Some back pain.    Objective:   Vitals:   09/08/23 2012 09/09/23 0418 09/09/23 0840 09/09/23 1210  BP:  (!) 159/94 (!) 159/94 137/81  Pulse:  83  80  Resp:  20  17  Temp:  98.4 F (36.9 C)  98.2 F (36.8 C)  TempSrc:    Oral  SpO2: 96% 91%  94%   Weight:      Height:        Intake/Output Summary (Last 24 hours) at 09/09/2023 1635 Last data filed at 09/09/2023 1427 Gross per 24 hour  Intake 518 ml  Output 1700 ml  Net -1182 ml   Filed Weights   09/06/23 1655  Weight: 98.9 kg     Exam General exam: Appears calm and comfortable  Respiratory system: Clear to auscultation. Respiratory effort normal. Cardiovascular system: S1 & S2 heard, RRR. No JVD,  Gastrointestinal system: Abdomen is nondistended, soft and nontender.  Central nervous system: Alert and oriented. No focal neurological deficits. Extremities: Symmetric 5 x 5 power. Skin: No rashes, Psychiatry: Mood & affect appropriate.     Data Reviewed:  I have personally reviewed following labs and imaging studies   CBC Lab Results  Component Value Date   WBC 10.7 (H) 09/09/2023   RBC 4.80 09/09/2023   HGB 13.3 09/09/2023   HCT 41.3 09/09/2023   MCV 86.0 09/09/2023   MCH 27.7 09/09/2023   PLT 244 09/09/2023   MCHC 32.2 09/09/2023   RDW 14.0 09/09/2023   LYMPHSABS 3.4 09/09/2023   MONOABS 0.7 09/09/2023   EOSABS 0.3 09/09/2023   BASOSABS 0.0 09/09/2023     Last metabolic panel Lab Results  Component Value Date   NA 131 (L) 09/09/2023   K 4.5 09/09/2023   CL 95 (L) 09/09/2023   CO2 27 09/09/2023   BUN 17 09/09/2023   CREATININE 0.94 09/09/2023   GLUCOSE 207 (H) 09/09/2023   GFRNONAA >60 09/09/2023   GFRAA >60 07/08/2020   CALCIUM 9.5 09/09/2023   PHOS 1.7 (L) 12/02/2021   PROT 5.7 (L) 09/08/2023   ALBUMIN 2.7 (L) 09/08/2023   BILITOT 0.5 09/08/2023   ALKPHOS 60 09/08/2023   AST 79 (H) 09/08/2023   ALT 29 09/08/2023   ANIONGAP 9 09/09/2023    CBG (last 3)  Recent Labs    09/09/23 0733 09/09/23 1211 09/09/23 1625  GLUCAP 190* 209* 149*      Coagulation Profile: No results for input(s): "INR", "PROTIME" in the last 168 hours.   Radiology Studies: ECHOCARDIOGRAM COMPLETE  Result Date: 09/08/2023    ECHOCARDIOGRAM REPORT    Patient Name:   Karla Hoover Date of Exam: 09/08/2023 Medical Rec #:  [id number]         Height:       67.0 in Accession #:    [id number]        Weight:       218.0 lb Date of Birth:  09-10-1971         BSA:          2.098 m Patient Age:    52 years          BP:           157/85 mmHg Patient Gender: F                 HR:           81 bpm. Exam Location:  Inpatient Procedure: 2D Echo, Cardiac Doppler and Color Doppler Indications:    Dyspnea R06.00  History:        Patient has prior history of Echocardiogram examinations, most                 recent 05/31/2021.  Sonographer:    Harriette Bouillon RDCS Referring Phys: Nilda Calamity Dorann Davidson IMPRESSIONS  1. Left ventricular ejection fraction, by estimation, is 60  to 65%. The left ventricle has normal function. The left ventricle has no regional wall motion abnormalities. There is mild left ventricular hypertrophy. Left ventricular diastolic parameters are consistent with Grade I diastolic dysfunction (impaired relaxation).  2. Right ventricular systolic function is normal. The right ventricular size is normal.  3. The mitral valve is normal in structure. No evidence of mitral valve regurgitation.  4. The aortic valve is tricuspid. Aortic valve regurgitation is not visualized. Comparison(s): Changes from prior study are noted. 05/31/2021: LVEF 55-60%. FINDINGS  Left Ventricle: Left ventricular ejection fraction, by estimation, is 60 to 65%. The left ventricle has normal function. The left ventricle has no regional wall motion abnormalities. The left ventricular internal cavity size was normal in size. There is  mild left ventricular hypertrophy. Left ventricular diastolic parameters are consistent with Grade I diastolic dysfunction (impaired relaxation). Indeterminate filling pressures. Right Ventricle: The right ventricular size is normal. No increase in right ventricular wall thickness. Right ventricular systolic function is normal. Left Atrium: Left atrial size was normal in  size. Right Atrium: Right atrial size was normal in size. Pericardium: There is no evidence of pericardial effusion. Presence of epicardial fat layer. Mitral Valve: The mitral valve is normal in structure. No evidence of mitral valve regurgitation. Tricuspid Valve: The tricuspid valve is grossly normal. Tricuspid valve regurgitation is trivial. Aortic Valve: The aortic valve is tricuspid. Aortic valve regurgitation is not visualized. Pulmonic Valve: The pulmonic valve was not well visualized. Pulmonic valve regurgitation is not visualized. Aorta: The aortic root and ascending aorta are structurally normal, with no evidence of dilitation. IAS/Shunts: No atrial level shunt detected by color flow Doppler.  LEFT VENTRICLE PLAX 2D LVIDd:         4.90 cm LVIDs:         4.10 cm LV PW:         1.10 cm LV IVS:        1.10 cm LVOT diam:     2.10 cm LV SV:         43 LV SV Index:   20 LVOT Area:     3.46 cm  RIGHT VENTRICLE RV S prime:     11.00 cm/s TAPSE (M-mode): 2.2 cm LEFT ATRIUM           Index LA diam:      3.70 cm 1.76 cm/m LA Vol (A4C): 30.8 ml 14.68 ml/m  AORTIC VALVE LVOT Vmax:   62.00 cm/s LVOT Vmean:  46.400 cm/s LVOT VTI:    0.123 m  AORTA Ao Root diam: 2.90 cm Ao Asc diam:  3.10 cm MITRAL VALVE MV Area (PHT): 4.10 cm    SHUNTS MV Decel Time: 185 msec    Systemic VTI:  0.12 m MV E velocity: 53.10 cm/s  Systemic Diam: 2.10 cm MV A velocity: 75.00 cm/s MV E/A ratio:  0.71 Zoila Shutter MD Electronically signed by Zoila Shutter MD Signature Date/Time: 09/08/2023/6:01:33 PM    Final    DG CHEST PORT 1 VIEW  Result Date: 09/08/2023 CLINICAL DATA:  Follow-up radiograph. Altered mental status and hyperglycemia. EXAM: PORTABLE CHEST 1 VIEW COMPARISON:  Chest radiograph dated 09/06/2023. FINDINGS: Minimal left lung base atelectasis. No focal consolidation, pleural effusion, or pneumothorax. Stable cardiac silhouette. No acute osseous pathology. IMPRESSION: No active disease. Electronically Signed   By: Elgie Collard M.D.   On: 09/08/2023 17:43       Kathlen Mody M.D. [facility] 09/09/2023, 4:35 PM  Available via Epic secure  chat 7am-7pm After 7 pm, please refer to night coverage provider listed on amion.

## 2023-09-09 NOTE — Plan of Care (Signed)
  Problem: Fluid Volume: Goal: Hemodynamic stability will improve Outcome: Progressing   Problem: Clinical Measurements: Goal: Diagnostic test results will improve Outcome: Progressing Goal: Signs and symptoms of infection will decrease Outcome: Progressing   Problem: Respiratory: Goal: Ability to maintain adequate ventilation will improve Outcome: Progressing   Problem: Fluid Volume: Goal: Ability to maintain a balanced intake and output will improve Outcome: Progressing   Problem: Metabolic: Goal: Ability to maintain appropriate glucose levels will improve Outcome: Progressing   Problem: Nutritional: Goal: Maintenance of adequate nutrition will improve Outcome: Progressing   Problem: Skin Integrity: Goal: Risk for impaired skin integrity will decrease Outcome: Progressing   Problem: Tissue Perfusion: Goal: Adequacy of tissue perfusion will improve Outcome: Progressing   Problem: Clinical Measurements: Goal: Ability to maintain clinical measurements within normal limits will improve Outcome: Progressing Goal: Will remain free from infection Outcome: Progressing Goal: Respiratory complications will improve Outcome: Progressing   Problem: Activity: Goal: Risk for activity intolerance will decrease Outcome: Progressing   Problem: Nutrition: Goal: Adequate nutrition will be maintained Outcome: Progressing   Problem: Coping: Goal: Level of anxiety will decrease Outcome: Progressing   Problem: Elimination: Goal: Will not experience complications related to bowel motility Outcome: Progressing Goal: Will not experience complications related to urinary retention Outcome: Progressing   Problem: Pain Management: Goal: General experience of comfort will improve Outcome: Progressing   Problem: Safety: Goal: Ability to remain free from injury will improve Outcome: Progressing   Problem: Skin Integrity: Goal: Risk for impaired skin integrity will  decrease Outcome: Progressing

## 2023-09-10 ENCOUNTER — Other Ambulatory Visit (HOSPITAL_COMMUNITY): Payer: Self-pay

## 2023-09-10 DIAGNOSIS — N390 Urinary tract infection, site not specified: Secondary | ICD-10-CM | POA: Diagnosis not present

## 2023-09-10 DIAGNOSIS — Z1612 Extended spectrum beta lactamase (ESBL) resistance: Secondary | ICD-10-CM | POA: Diagnosis not present

## 2023-09-10 DIAGNOSIS — B9629 Other Escherichia coli [E. coli] as the cause of diseases classified elsewhere: Secondary | ICD-10-CM | POA: Diagnosis not present

## 2023-09-10 LAB — CBC WITH DIFFERENTIAL/PLATELET
Abs Immature Granulocytes: 0.03 10*3/uL (ref 0.00–0.07)
Basophils Absolute: 0.1 10*3/uL (ref 0.0–0.1)
Basophils Relative: 1 %
Eosinophils Absolute: 0.3 10*3/uL (ref 0.0–0.5)
Eosinophils Relative: 3 %
HCT: 40.5 % (ref 36.0–46.0)
Hemoglobin: 13.1 g/dL (ref 12.0–15.0)
Immature Granulocytes: 0 %
Lymphocytes Relative: 29 %
Lymphs Abs: 3.1 10*3/uL (ref 0.7–4.0)
MCH: 27.7 pg (ref 26.0–34.0)
MCHC: 32.3 g/dL (ref 30.0–36.0)
MCV: 85.6 fL (ref 80.0–100.0)
Monocytes Absolute: 0.8 10*3/uL (ref 0.1–1.0)
Monocytes Relative: 7 %
Neutro Abs: 6.4 10*3/uL (ref 1.7–7.7)
Neutrophils Relative %: 60 %
Platelets: 248 10*3/uL (ref 150–400)
RBC: 4.73 MIL/uL (ref 3.87–5.11)
RDW: 14 % (ref 11.5–15.5)
WBC: 10.7 10*3/uL — ABNORMAL HIGH (ref 4.0–10.5)
nRBC: 0 % (ref 0.0–0.2)

## 2023-09-10 LAB — BASIC METABOLIC PANEL
Anion gap: 13 (ref 5–15)
BUN: 26 mg/dL — ABNORMAL HIGH (ref 6–20)
CO2: 28 mmol/L (ref 22–32)
Calcium: 9.4 mg/dL (ref 8.9–10.3)
Chloride: 95 mmol/L — ABNORMAL LOW (ref 98–111)
Creatinine, Ser: 1.24 mg/dL — ABNORMAL HIGH (ref 0.44–1.00)
GFR, Estimated: 53 mL/min — ABNORMAL LOW (ref >60)
Glucose, Bld: 177 mg/dL — ABNORMAL HIGH (ref 70–99)
Potassium: 4.2 mmol/L (ref 3.5–5.1)
Sodium: 136 mmol/L (ref 135–145)

## 2023-09-10 LAB — URINE CULTURE: Culture: >=100000 — AB

## 2023-09-10 LAB — GLUCOSE, CAPILLARY: Glucose-Capillary: 112 mg/dL — ABNORMAL HIGH (ref 70–99)

## 2023-09-10 MED ORDER — CLONAZEPAM 0.5 MG PO TABS: 0.5000 mg | ORAL_TABLET | Freq: Two times a day (BID) | ORAL | 0 refills | Status: DC

## 2023-09-10 MED ORDER — NITROFURANTOIN MONOHYD MACRO 100 MG PO CAPS
ORAL_CAPSULE | ORAL | 0 refills | Status: AC
  Filled 2023-09-10: qty 40, 35d supply, fill #0

## 2023-09-10 MED ORDER — SULFAMETHOXAZOLE-TRIMETHOPRIM 800-160 MG PO TABS
1.0000 | ORAL_TABLET | Freq: Two times a day (BID) | ORAL | 0 refills | Status: AC
Start: 2023-09-10 — End: 2023-09-13
  Filled 2023-09-10: qty 6, 3d supply, fill #0

## 2023-09-10 NOTE — Progress Notes (Signed)
This nurse tried to call report to [facility], there was no answer after being transferred. Will try back later.

## 2023-09-10 NOTE — TOC Transition Note (Signed)
Transition of Care [facility]) - CM/SW Discharge Note   Patient Details  Name: Karla Hoover MRN: [id number] Date of Birth: 1971/05/14  [facility] CM/SW Contact:  Otelia Santee, LCSW Phone Number: 09/10/2023, 10:02 AM   Clinical Narrative:    Pt to return to [place] where she is a LTC resident. Pt will be going to room 305. RN to call report to [phone]. DC packet with signed scripts placed at nursing station. [facility] called at 10:08am for transportation.    Final next level of care: Long Term Nursing Home Barriers to Discharge: Barriers Resolved   Patient Goals and CMS Choice CMS Medicare.gov Compare Post Acute Care list provided to:: Patient Choice offered to / list presented to : Patient  Discharge Placement                Patient chooses bed at: [facility] Patient to be transferred to facility by: [facility] Name of family member notified: Patient Patient and family notified of of transfer: 09/10/23  Discharge Plan and Services Additional resources added to the After Visit Summary for                  DME Arranged: N/A DME Agency: NA                  Social Determinants of Health (SDOH) Interventions SDOH Screenings   Food Insecurity: No Food Insecurity (09/06/2023)  Housing: Low Risk  (09/06/2023)  Transportation Needs: No Transportation Needs (09/06/2023)  Utilities: Not At Risk (09/06/2023)  Depression (PHQ2-9): Medium Risk (10/04/2019)  Financial Resource Strain: Not on File (08/17/2018)   Received from [place], [place]  Physical Activity: Not on File (08/17/2018)   Received from [place], [place]  Social Connections: Not on File (06/28/2023)   Received from [facility]  Stress: Not on File (08/17/2018)   Received from [place], [place]  Tobacco Use: Medium Risk (09/06/2023)     Readmission Risk Interventions    09/10/2023   10:01 AM 01/21/2023   12:08 PM 12/18/2022   11:10 AM  Readmission Risk Prevention Plan  Transportation Screening  Complete Complete Complete  PCP or Specialist Appt within 5-7 Days  Complete   PCP or Specialist Appt within 3-5 Days Complete  Complete  Home Care Screening  Complete   Medication Review (RN CM)  Complete   HRI or Home Care Consult Complete  Complete  Social Work Consult for Recovery Care Planning/Counseling Complete  Complete  Palliative Care Screening Not Applicable  Not Applicable  Medication Review Oceanographer) Complete  Complete

## 2023-09-10 NOTE — Progress Notes (Signed)
This nurse tried again to call [facility] with report. Transferred again with no answer.

## 2023-09-10 NOTE — Discharge Summary (Addendum)
Physician Discharge Summary  Karla Hoover EXB:[id number] DOB: Apr 03, 1971 DOA: 09/06/2023  PCP: Pcp, No  Admit date: 09/06/2023 Discharge date: 09/10/2023 30 Day Unplanned Readmission Risk Score    Flowsheet Row ED to Hosp-Admission (Current) from 09/06/2023 in [facility]  30 Day Unplanned Readmission Risk Score (%) 24.46 Filed at 09/10/2023 0801       This score is the patient's risk of an unplanned readmission within 30 days of being discharged (0 -100%). The score is based on dignosis, age, lab data, medications, orders, and past utilization.   Low:  0-14.9   Medium: 15-21.9   High: 22-29.9   Extreme: 30 and above          Admitted From: SNF   Disposition: SNF  Recommendations for Outpatient Follow-up:  Follow up with PCP in 1-2 weeks Please obtain BMP/CBC in one week Please follow up with your PCP on the following pending results: Unresulted Labs (From admission, onward)    None         Home Health: None Equipment/Devices: None  Discharge Condition: Stable CODE STATUS: Full code Diet recommendation: Diabetic  Subjective: Patient seen and examined.  She is fully alert and oriented and she has no complaints and she wants to go back to the facility today.  Brief/Interim Summary: Karla Hoover is a 52 y.o. female with medical history significant of asthma, bilateral pulmonary embolism, stage IIIa CKD, history of AKI episode, history of COPD, chronic respiratory failure with hypoxia occasionally on oxygen, type 2 diabetes, hypertension, GERD, grade 1 diastolic dysfunction, hypertension, hypothyroidism, hyperlipidemia, history of CVA with left-sided hemiparesis who was sent from her facility due to altered mental status and hyperglycemia and was admitted under hospitalist service for acute encephalopathy secondary to UTI. CT head without contrast with no acute or reversible finding. There are remote right cerebral infarcts.    Acute metabolic encephalopathy:  - suspect from UTI and possible cellulitis of the left lower extremity.  - resolved.    Urinary tract infection:  On IV Meropenem, for her history of ESBL UTI's.  She is on Macrobid Daily for chronic suppression at the LTC for the recurrent UTI'S.  Urine cultures show 1,00,000  ESBL E coli UTI.  Case was discussed with ID, she received Merrem for 3 days that she was here and ID recommended discharging on 5 more days of Macrobid twice daily.  After that, she will resume her daily dose of Macrobid. Update: Received final urine culture report after patient was discharged and sent to the facility and now it is growing Klebsiella pneumonia.  Discussed with pharmacy, will prescribe 3 days of Bactrim DS along with Macrobid as prescribed previously.  Notified TOC to notify the facility.  AKI on stage 3 a CKD Suspect from dehydration.  Admitted with a creatinine of 3.2, improved and back to baseline with IV fluids.    Elevated lactic acid Resolved. From UTI, cellulitis.  Repeat lactic acid is 1.4   Uncontrolled DM with hyperglycemia: A1c is 10.3.  Resume PTA medications including insulin.   Bilateral PE;  On eliquis resume the same.    Hyperlipidemia:  Resume home meds.    Hyperkalemia:  Resolved.    Left lower extremity swelling and redness  Cellulitis.  MRSA PCR is negative.  Improving. Tenderness improved.  Venous duplex of the lower extremities is negative for DVT.  Does not have any signs of cellulitis today on my examination.   Hypothyroidism Resume synthroid  88 mcg daily.    Chronic respiratory failure with hypoxia secondary to COPD:  Continue with bronchodilators as needed.  She is back on [place] oxygen and CXR is negative for active disease.   Resume Dulera and Incruse.    Chronic diastolic CHF.  Last echo in 2022, with preserved LVEF and Grade 1 diastolic dysfunction.  Echocardiogram reviewed. No changes in meds.    Uncontrolled  Hypertension;  Well controlled.  Resume PTA medications.    Ongoing Tobacco Use: I have discussed tobacco cessation with the patient.  I have counseled the patient regarding the negative impacts of continued tobacco use including but not limited to lung cancer, COPD, and cardiovascular disease.  I have discussed alternatives to tobacco and modalities that may help facilitate tobacco cessation including but not limited to biofeedback, hypnosis, and medications.  Total time spent with tobacco counseling was 5 minutes.  Hyponatremia:  Mild and asymptomatic.    Estimated body mass index is 34.15 kg/m as calculated from the following:   Height as of this encounter: 5\' 7"  (1.702 m).   Weight as of this encounter: 98.9 kg.Weight loss and diet modification counseled.  Discharge plan was discussed with patient and/or family member and they verbalized understanding and agreed with it.  Discharge Diagnoses:  Principal Problem:   UTI due to extended-spectrum beta lactamase (ESBL) producing Escherichia coli Active Problems:   Acute renal failure superimposed on stage 3a chronic kidney disease (HCC)   COPD with chronic bronchitis (HCC)   Essential hypertension   Hemiparesis affecting left side as late effect of cerebrovascular accident (CVA) (HCC)   DM (diabetes mellitus), type 2 (HCC)   hx of PE/DVT   Grade I diastolic dysfunction   GERD without esophagitis   Hypothyroidism   History of stroke   Tobacco abuse   Sepsis secondary to UTI (HCC)   Class 1 obesity due to excess calories with body mass index (BMI) of 32.0 to 32.9 in adult   Dyslipidemia   Acute metabolic encephalopathy   Acute on chronic respiratory failure with hypoxia and hypercapnia (HCC)   Hypotension    Discharge Instructions   Allergies as of 09/10/2023       Reactions   Cipro [ciprofloxacin Hcl] Hives   Guaifenesin Anaphylaxis   Kiwi Extract Anaphylaxis, Rash   Levaquin [levofloxacin] Shortness Of Breath, Rash    Strawberry Extract Anaphylaxis, Rash   Lioresal [baclofen] Other (See Comments)   Near syncope/ fall        Medication List     TAKE these medications    acetaminophen 500 MG tablet Commonly known as: TYLENOL Take 1,000 mg by mouth in the morning, at noon, and at bedtime.   albuterol 108 (90 Base) MCG/ACT inhaler Commonly known as: VENTOLIN HFA Inhale 2 puffs into the lungs every 6 (six) hours as needed for wheezing or shortness of breath.   apixaban 5 MG Tabs tablet Commonly known as: Eliquis Take 1 tablet (5mg ) twice daily What changed:  how much to take how to take this when to take this additional instructions   aspirin 81 MG chewable tablet Chew 81 mg by mouth in the morning.   atenolol 25 MG tablet Commonly known as: TENORMIN Take 50 mg by mouth 2 (two) times daily.   atorvastatin 80 MG tablet Commonly known as: LIPITOR Take 1 tablet (80 mg total) by mouth daily. What changed: when to take this   B Complex-Biotin-FA Tabs Take 1 tablet by mouth in the morning.  chlorhexidine 0.12 % solution Commonly known as: PERIDEX Use as directed 15 mLs in the mouth or throat every morning. Rinse and spit after oral care. Do not swallow.   clonazePAM 0.5 MG tablet Commonly known as: KLONOPIN Take 1 tablet (0.5 mg total) by mouth 2 (two) times daily.   cyclobenzaprine 10 MG tablet Commonly known as: FLEXERIL Take 1 tablet (10 mg total) by mouth 3 (three) times daily as needed for muscle spasms. What changed: when to take this   EPINEPHrinesnap 1 MG/ML Kit Inject 1 mg as directed as needed (allergic reaction).   ergocalciferol 1.25 MG (50000 UT) capsule Commonly known as: VITAMIN D2 Take 50,000 Units by mouth every Thursday.   eucerin cream Apply 1 Application topically daily as needed for dry skin.   ezetimibe 10 MG tablet Commonly known as: ZETIA Take 10 mg by mouth at bedtime.   Fish Oil 1000 MG Caps Take 1,000 mg by mouth 3 (three) times daily.    FLUoxetine 20 MG capsule Commonly known as: PROZAC Take 20 mg by mouth daily.   gabapentin 400 MG capsule Commonly known as: NEURONTIN Take 1 capsule (400 mg total) by mouth 3 (three) times daily.   hydrALAZINE 10 MG tablet Commonly known as: APRESOLINE Take 10 mg by mouth every 12 (twelve) hours as needed (BP>160 or dBP >95).   insulin glargine 100 UNIT/ML injection Commonly known as: LANTUS Inject 0.1 mLs (10 Units total) into the skin 2 (two) times daily. (discard vial 28 days after first use) What changed: how much to take   insulin lispro 100 UNIT/ML injection Commonly known as: HUMALOG Inject 0-14 Units into the skin See admin instructions. Inject 0-14 units twice daily as per sliding scale : CBG 0-200 : 0 units CBG 201-250 : 2 units CBG 251-300 : 4 units CBG 301-350 : 6 units CBG 351-400 : 8 units CBG 401-450 : 10 units CBG 451-500 : 12 units (if BP reads high, inject 14 units)   LACTOBACILLUS PO Take 1 capsule by mouth daily.   lansoprazole 30 MG capsule Commonly known as: PREVACID Take 30 mg by mouth every morning.   levothyroxine 88 MCG tablet Commonly known as: SYNTHROID Take 88 mcg by mouth daily before breakfast.   losartan 25 MG tablet Commonly known as: COZAAR Take 37.5 mg by mouth daily.   magnesium oxide 400 (240 Mg) MG tablet Commonly known as: MAG-OX Take 400 mg by mouth every other day.   Melatonin 5 MG Tbdp Take 5 mg by mouth at bedtime.   Menthol (Topical Analgesic) 5 % Gel Apply 1 application  topically in the morning. To neck   Nasal Moist Gel Place 1 application  into both nostrils at bedtime.   nitrofurantoin (macrocrystal-monohydrate) 100 MG capsule Commonly known as: MACROBID Take 1 capsule (100 mg total) by mouth 2 (two) times daily for 5 days, THEN 1 capsule (100 mg total) at bedtime. Start taking on: September 10, 2023 What changed: See the new instructions.   ondansetron 4 MG tablet Commonly known as: ZOFRAN Take 4 mg by  mouth 2 (two) times daily.   OXYGEN Inhale 2 L/min into the lungs as needed (for shortness of breath).   QUEtiapine 25 MG tablet Commonly known as: SEROQUEL Take 25 mg by mouth at bedtime.   rOPINIRole 0.25 MG tablet Commonly known as: REQUIP Take 0.25 mg by mouth at bedtime.   Spiriva Respimat 2.5 MCG/ACT Aers Generic drug: Tiotropium Bromide Monohydrate Inhale 2 puffs into the lungs in  the morning.   sulfamethoxazole-trimethoprim 800-160 MG tablet Commonly known as: Bactrim DS Take 1 tablet by mouth 2 (two) times daily for 3 days.   Symbicort 80-4.5 MCG/ACT inhaler Generic drug: budesonide-formoterol Inhale 2 puffs into the lungs 2 (two) times daily.   Trulicity 0.75 MG/0.5ML Soaj Generic drug: Dulaglutide Inject 0.75 mg into the skin every Friday.   witch hazel-glycerin pad Commonly known as: TUCKS Apply 1 Application topically every 4 (four) hours as needed for hemorrhoids.        Contact information for follow-up providers     PCP Follow up in 1 week(s).               Contact information for after-discharge care     Destination     [facility] .   Service: Skilled Nursing Contact information: [address] [place] [address] [phone]                    Allergies  Allergen Reactions   Cipro [Ciprofloxacin Hcl] Hives   Guaifenesin Anaphylaxis   Kiwi Extract Anaphylaxis and Rash   Levaquin [Levofloxacin] Shortness Of Breath and Rash   Strawberry Extract Anaphylaxis and Rash   Lioresal [Baclofen] Other (See Comments)    Near syncope/ fall    Consultations: None   Procedures/Studies: ECHOCARDIOGRAM COMPLETE  Result Date: 09/08/2023    ECHOCARDIOGRAM REPORT   Patient Name:   HADESSAH HELBING Date of Exam: 09/08/2023 Medical Rec #:  [id number]         Height:       67.0 in Accession #:    [id number]        Weight:       218.0 lb Date of Birth:  [birth year]         BSA:          2.098 m Patient Age:    52  years          BP:           157/85 mmHg Patient Gender: F                 HR:           81 bpm. Exam Location:  Inpatient Procedure: 2D Echo, Cardiac Doppler and Color Doppler Indications:    Dyspnea R06.00  History:        Patient has prior history of Echocardiogram examinations, most                 recent 05/31/2021.  Sonographer:    Harriette Bouillon RDCS Referring Phys: Nilda Calamity AKULA IMPRESSIONS  1. Left ventricular ejection fraction, by estimation, is 60 to 65%. The left ventricle has normal function. The left ventricle has no regional wall motion abnormalities. There is mild left ventricular hypertrophy. Left ventricular diastolic parameters are consistent with Grade I diastolic dysfunction (impaired relaxation).  2. Right ventricular systolic function is normal. The right ventricular size is normal.  3. The mitral valve is normal in structure. No evidence of mitral valve regurgitation.  4. The aortic valve is tricuspid. Aortic valve regurgitation is not visualized. Comparison(s): Changes from prior study are noted. 05/31/2021: LVEF 55-60%. FINDINGS  Left Ventricle: Left ventricular ejection fraction, by estimation, is 60 to 65%. The left ventricle has normal function. The left ventricle has no regional wall motion abnormalities. The left ventricular internal cavity size was normal in size. There is  mild left ventricular hypertrophy. Left ventricular diastolic parameters are consistent with  Grade I diastolic dysfunction (impaired relaxation). Indeterminate filling pressures. Right Ventricle: The right ventricular size is normal. No increase in right ventricular wall thickness. Right ventricular systolic function is normal. Left Atrium: Left atrial size was normal in size. Right Atrium: Right atrial size was normal in size. Pericardium: There is no evidence of pericardial effusion. Presence of epicardial fat layer. Mitral Valve: The mitral valve is normal in structure. No evidence of mitral valve  regurgitation. Tricuspid Valve: The tricuspid valve is grossly normal. Tricuspid valve regurgitation is trivial. Aortic Valve: The aortic valve is tricuspid. Aortic valve regurgitation is not visualized. Pulmonic Valve: The pulmonic valve was not well visualized. Pulmonic valve regurgitation is not visualized. Aorta: The aortic root and ascending aorta are structurally normal, with no evidence of dilitation. IAS/Shunts: No atrial level shunt detected by color flow Doppler.  LEFT VENTRICLE PLAX 2D LVIDd:         4.90 cm LVIDs:         4.10 cm LV PW:         1.10 cm LV IVS:        1.10 cm LVOT diam:     2.10 cm LV SV:         43 LV SV Index:   20 LVOT Area:     3.46 cm  RIGHT VENTRICLE RV S prime:     11.00 cm/s TAPSE (M-mode): 2.2 cm LEFT ATRIUM           Index LA diam:      3.70 cm 1.76 cm/m LA Vol (A4C): 30.8 ml 14.68 ml/m  AORTIC VALVE LVOT Vmax:   62.00 cm/s LVOT Vmean:  46.400 cm/s LVOT VTI:    0.123 m  AORTA Ao Root diam: 2.90 cm Ao Asc diam:  3.10 cm MITRAL VALVE MV Area (PHT): 4.10 cm    SHUNTS MV Decel Time: 185 msec    Systemic VTI:  0.12 m MV E velocity: 53.10 cm/s  Systemic Diam: 2.10 cm MV A velocity: 75.00 cm/s MV E/A ratio:  0.71 Zoila Shutter MD Electronically signed by Zoila Shutter MD Signature Date/Time: 09/08/2023/6:01:33 PM    Final    DG CHEST PORT 1 VIEW  Result Date: 09/08/2023 CLINICAL DATA:  Follow-up radiograph. Altered mental status and hyperglycemia. EXAM: PORTABLE CHEST 1 VIEW COMPARISON:  Chest radiograph dated 09/06/2023. FINDINGS: Minimal left lung base atelectasis. No focal consolidation, pleural effusion, or pneumothorax. Stable cardiac silhouette. No acute osseous pathology. IMPRESSION: No active disease. Electronically Signed   By: Elgie Collard M.D.   On: 09/08/2023 17:43   VAS [place] LOWER EXTREMITY VENOUS (DVT)  Result Date: 09/08/2023  Lower Venous DVT Study Patient Name:  VALOR HUEBER  Date of Exam:   09/07/2023 Medical Rec #: [id number]          Accession #:     [id number] Date of Birth: [birth year]          Patient Gender: F Patient Age:   39 years Exam Location:  [facility] Procedure:      VAS [place] LOWER EXTREMITY VENOUS (DVT) Referring Phys: Kathlen Mody --------------------------------------------------------------------------------  Indications: Pain, and Swelling.  Risk Factors: PE & DVT 11/2019, decreased mobility due to past stroke. Limitations: Poor patient positioning - could not roll off left side., body habitus and poor ultrasound/tissue interface. Comparison Study: Previous exam on 11/16/2019 was positive for DVT in LLE F[facility]. Performing Technologist: Ernestene Mention RVT, RDMS  Examination Guidelines: A complete evaluation includes B-mode imaging, spectral Doppler, color  Doppler, and power Doppler as needed of all accessible portions of each vessel. Bilateral testing is considered an integral part of a complete examination. Limited examinations for reoccurring indications may be performed as noted. The reflux portion of the exam is performed with the patient in reverse Trendelenburg.  +-----+---------------+---------+-----------+----------+--------------+ RIGHTCompressibilityPhasicitySpontaneityPropertiesThrombus Aging +-----+---------------+---------+-----------+----------+--------------+ CFV  Full           Yes      Yes                                 +-----+---------------+---------+-----------+----------+--------------+   +---------+---------------+---------+-----------+----------+------------------+ LEFT     CompressibilityPhasicitySpontaneityPropertiesThrombus Aging     +---------+---------------+---------+-----------+----------+------------------+ CFV      Full           No       Yes        pulsatile                    +---------+---------------+---------+-----------+----------+------------------+ SFJ      Full                                                             +---------+---------------+---------+-----------+----------+------------------+ FV Prox  Full           Yes      Yes                                     +---------+---------------+---------+-----------+----------+------------------+ FV Mid   Full           Yes      Yes                                     +---------+---------------+---------+-----------+----------+------------------+ FV DistalFull           Yes      Yes                                     +---------+---------------+---------+-----------+----------+------------------+ PFV      Full                                                            +---------+---------------+---------+-----------+----------+------------------+ POP      Full           Yes      Yes                                     +---------+---------------+---------+-----------+----------+------------------+ PTV                                                   unable to insonate +---------+---------------+---------+-----------+----------+------------------+ PERO  unable to insonate +---------+---------------+---------+-----------+----------+------------------+   Left Technical Findings: Not visualized segments include peroneal and posterior tibial veins.   Summary: RIGHT: - No evidence of common femoral vein obstruction.  LEFT: - There is no evidence of deep vein thrombosis in the lower extremity. However, portions of this examination were limited- see technologist comments above.  - No cystic structure found in the popliteal fossa.  *See table(s) above for measurements and observations. Electronically signed by Gerarda Fraction on 09/08/2023 at 3:58:55 PM.    Final    US RENAL  Result Date: 09/07/2023 CLINICAL DATA:  Acute kidney insufficiency EXAM: RENAL / URINARY TRACT ULTRASOUND COMPLETE COMPARISON:  CT 04/08/2023. FINDINGS: Right Kidney: Renal measurements: 7.1 x 3.4 x 4.3 cm = volume: 52.8  mL. Diffusely atrophic right kidney with some echogenicity of the parenchyma. No collecting system dilatation or perinephric fluid. Left Kidney: Renal measurements: 9.8 x 4.4 x 4.1 cm = volume: 92.0 mL. Echogenicity within normal limits. No mass or hydronephrosis visualized. Bladder: Contracted with a Foley catheter. Other: Study limited by overlapping bowel gas and soft tissue. Echogenic appearing liver parenchyma as well IMPRESSION: No collecting system dilatation.  Small echogenic right kidney. Electronically Signed   By: Karen Kays M.D.   On: 09/07/2023 16:02   DG Chest Portable 1 View  Result Date: 09/06/2023 CLINICAL DATA:  Altered mental status and hyperglycemia EXAM: PORTABLE CHEST 1 VIEW COMPARISON:  Chest radiograph dated 01/21/2023 FINDINGS: Normal lung volumes. Left basilar linear opacities. No pleural effusion or pneumothorax. Enlarged cardiomediastinal silhouette is likely projectional. No acute osseous abnormality. IMPRESSION: Left basilar linear opacities, likely atelectasis. Electronically Signed   By: Agustin Cree M.D.   On: 09/06/2023 12:24   CT Head Wo Contrast  Result Date: 09/06/2023 CLINICAL DATA:  Mental status change with unknown cause. EXAM: CT HEAD WITHOUT CONTRAST TECHNIQUE: Contiguous axial images were obtained from the base of the skull through the vertex without intravenous contrast. RADIATION DOSE REDUCTION: This exam was performed according to the departmental dose-optimization program which includes automated exposure control, adjustment of the mA and/or kV according to patient size and/or use of iterative reconstruction technique. COMPARISON:  12/13/2022 FINDINGS: Brain: No evidence of acute infarction, hemorrhage, hydrocephalus, extra-axial collection or mass lesion/mass effect. Chronic perforator infarct at the right basal ganglia with patchy chronic infarcts along the right frontal parietal cortex. Wallerian degeneration seen into the brainstem. Vascular: No hyperdense  vessel or unexpected calcification. Skull: Normal. Negative for fracture or focal lesion. Sinuses/Orbits: No acute finding. IMPRESSION: 1. No acute or reversible finding. 2. Remote right cerebral infarcts. Electronically Signed   By: Tiburcio Pea M.D.   On: 09/06/2023 12:17     Discharge Exam: Vitals:   09/09/23 1938 09/10/23 0527  BP: 108/65 119/87  Pulse: 76 78  Resp: 20 20  Temp: 98.1 F (36.7 C) 98.3 F (36.8 C)  SpO2: 92% 90%   Vitals:   09/09/23 0840 09/09/23 1210 09/09/23 1938 09/10/23 0527  BP: (!) 159/94 137/81 108/65 119/87  Pulse:  80 76 78  Resp:  17 20 20   Temp:  98.2 F (36.8 C) 98.1 F (36.7 C) 98.3 F (36.8 C)  TempSrc:  Oral    SpO2:  94% 92% 90%  Weight:      Height:        General: Pt is alert, awake, not in acute distress Cardiovascular: RRR, S1/S2 +, no rubs, no gallops Respiratory: CTA bilaterally, no wheezing, no rhonchi Abdominal: Soft, NT, ND, bowel sounds + Extremities: no  edema, no cyanosis    The results of significant diagnostics from this hospitalization (including imaging, microbiology, ancillary and laboratory) are listed below for reference.     Microbiology: Recent Results (from the past 240 hour(s))  Culture, blood (routine x 2)     Status: None (Preliminary result)   Collection Time: 09/06/23 11:58 AM   Specimen: BLOOD  Result Value Ref Range Status   Specimen Description   Final    BLOOD RIGHT ANTECUBITAL Performed at [facility], [address], [place], [place] [address]    Special Requests   Final    BOTTLES DRAWN AEROBIC AND ANAEROBIC Blood Culture adequate volume Performed at [facility], [address] [place], [place] [address]    Culture   Final    NO GROWTH 4 DAYS Performed at [facility], [address] [place], [place] [address]    Report Status PENDING  Incomplete  Resp panel by RT-PCR (RSV, Flu A&B, Covid) Anterior Nasal Swab     Status: None   Collection  Time: 09/06/23 12:17 PM   Specimen: Anterior Nasal Swab  Result Value Ref Range Status   SARS Coronavirus 2 by RT PCR NEGATIVE NEGATIVE Final    Comment: (NOTE) SARS-CoV-2 target nucleic acids are NOT DETECTED.  The SARS-CoV-2 RNA is generally detectable in upper respiratory specimens during the acute phase of infection. The lowest concentration of SARS-CoV-2 viral copies this assay can detect is 138 copies/mL. A negative result does not preclude SARS-Cov-2 infection and should not be used as the sole basis for treatment or other patient management decisions. A negative result may occur with  improper specimen collection/handling, submission of specimen other than nasopharyngeal swab, presence of viral mutation(s) within the areas targeted by this assay, and inadequate number of viral copies(<138 copies/mL). A negative result must be combined with clinical observations, patient history, and epidemiological information. The expected result is Negative.  Fact Sheet for Patients:  BloggerCourse.com  Fact Sheet for Healthcare Providers:  SeriousBroker.it  This test is no t yet approved or cleared by the [place] FDA and  has been authorized for detection and/or diagnosis of SARS-CoV-2 by FDA under an Emergency Use Authorization (EUA). This EUA will remain  in effect (meaning this test can be used) for the duration of the COVID-19 declaration under Section 564(b)(1) of the Act, 21 U.S.C.section 360bbb-3(b)(1), unless the authorization is terminated  or revoked sooner.       Influenza A by PCR NEGATIVE NEGATIVE Final   Influenza B by PCR NEGATIVE NEGATIVE Final    Comment: (NOTE) The Xpert Xpress SARS-CoV-2/FLU/RSV plus assay is intended as an aid in the diagnosis of influenza from Nasopharyngeal swab specimens and should not be used as a sole basis for treatment. Nasal washings and aspirates are unacceptable for Xpert Xpress  SARS-CoV-2/FLU/RSV testing.  Fact Sheet for Patients: BloggerCourse.com  Fact Sheet for Healthcare Providers: SeriousBroker.it  This test is not yet approved or cleared by the [place] FDA and has been authorized for detection and/or diagnosis of SARS-CoV-2 by FDA under an Emergency Use Authorization (EUA). This EUA will remain in effect (meaning this test can be used) for the duration of the COVID-19 declaration under Section 564(b)(1) of the Act, 21 U.S.C. section 360bbb-3(b)(1), unless the authorization is terminated or revoked.     Resp Syncytial Virus by PCR NEGATIVE NEGATIVE Final    Comment: (NOTE) Fact Sheet for Patients: BloggerCourse.com  Fact Sheet for Healthcare Providers: SeriousBroker.it  This test is not yet approved or cleared by the [place] and has been authorized for detection and/or diagnosis of SARS-CoV-2 by FDA under an Emergency Use Authorization (EUA). This EUA will remain in effect (meaning this test can be used) for the duration of the COVID-19 declaration under Section 564(b)(1) of the Act, 21 U.S.C. section 360bbb-3(b)(1), unless the authorization is terminated or revoked.  Performed at [facility], [address], [place], [place] [address]   Urine Culture     Status: Abnormal   Collection Time: 09/06/23 12:24 PM   Specimen: Urine, Catheterized  Result Value Ref Range Status   Specimen Description   Final    URINE, CATHETERIZED Performed at [facility], [address] [place], [place] [address]    Special Requests   Final    NONE Performed at [facility], [address], [place], [place] [address]    Culture (A)  Final    >=100,000 COLONIES/mL ESCHERICHIA COLI Confirmed Extended Spectrum Beta-Lactamase Producer (ESBL).  In bloodstream infections from ESBL organisms,  carbapenems are preferred over piperacillin/tazobactam. They are shown to have a lower risk of mortality. KLEBSIELLA PNEUMONIAE    Report Status 09/10/2023 FINAL  Final   Organism ID, Bacteria ESCHERICHIA COLI (A)  Final   Organism ID, Bacteria KLEBSIELLA PNEUMONIAE  Final      Susceptibility   Escherichia coli - MIC*    AMPICILLIN >=32 RESISTANT Resistant     CEFAZOLIN >=64 RESISTANT Resistant     CEFEPIME 16 RESISTANT Resistant     CEFTRIAXONE >=64 RESISTANT Resistant     CIPROFLOXACIN <=0.25 SENSITIVE Sensitive     GENTAMICIN >=16 RESISTANT Resistant     IMIPENEM <=0.25 SENSITIVE Sensitive     NITROFURANTOIN <=16 SENSITIVE Sensitive     TRIMETH/SULFA >=320 RESISTANT Resistant     AMPICILLIN/SULBACTAM >=32 RESISTANT Resistant     PIP/TAZO <=4 SENSITIVE Sensitive ug/mL    * >=100,000 COLONIES/mL ESCHERICHIA COLI   Klebsiella pneumoniae - MIC*    AMPICILLIN RESISTANT Resistant     CEFAZOLIN <=4 SENSITIVE Sensitive     CEFEPIME <=0.12 SENSITIVE Sensitive     CEFTRIAXONE <=0.25 SENSITIVE Sensitive     CIPROFLOXACIN <=0.25 SENSITIVE Sensitive     GENTAMICIN <=1 SENSITIVE Sensitive     IMIPENEM <=0.25 SENSITIVE Sensitive     NITROFURANTOIN 64 INTERMEDIATE Intermediate     TRIMETH/SULFA <=20 SENSITIVE Sensitive     AMPICILLIN/SULBACTAM <=2 SENSITIVE Sensitive     PIP/TAZO <=4 SENSITIVE Sensitive ug/mL    * KLEBSIELLA PNEUMONIAE  Culture, blood (routine x 2)     Status: None (Preliminary result)   Collection Time: 09/06/23 12:35 PM   Specimen: BLOOD  Result Value Ref Range Status   Specimen Description   Final    BLOOD SITE NOT SPECIFIED Performed at [facility], [address] [place], [place] [address]    Special Requests   Final    BOTTLES DRAWN AEROBIC ONLY Blood Culture results may not be optimal due to an inadequate volume of blood received in culture bottles Performed at [facility], [address] [place], [place] [address]     Culture   Final    NO GROWTH 4 DAYS Performed at [facility], [address] [place], [place] [address]    Report Status PENDING  Incomplete  MRSA Next Gen by PCR, Nasal     Status: None   Collection Time: 09/06/23  4:37 PM  Specimen: Nasal Mucosa; Nasal Swab  Result Value Ref Range Status   MRSA by PCR Next Gen NOT DETECTED NOT DETECTED Final    Comment: (NOTE) The GeneXpert MRSA Assay (FDA approved for NASAL specimens only), is one component of a comprehensive MRSA colonization surveillance program. It is not intended to diagnose MRSA infection nor to guide or monitor treatment for MRSA infections. Test performance is not FDA approved in patients less than 20 years old. Performed at [facility], [address] [place], [place] [address]      Labs: BNP (last 3 results) Recent Labs    12/13/22 1701 09/08/23 1445  BNP 74.9 141.1*   Basic Metabolic Panel: Recent Labs  Lab 09/06/23 2156 09/07/23 0313 09/08/23 0539 09/09/23 0540 09/10/23 0819  NA 136 135 134* 131* 136  K 6.1* 4.9 5.1 4.5 4.2  CL 100 101 101 95* 95*  CO2 26 26 27 27 28   GLUCOSE 218* 249* 222* 207* 177*  BUN 42* 38* 24* 17 26*  CREATININE 2.81* 2.43* 1.44* 0.94 1.24*  CALCIUM 9.0 8.8* 9.0 9.5 9.4   Liver Function Tests: Recent Labs  Lab 09/06/23 1158 09/07/23 0313 09/08/23 0539  AST 65* 103* 79*  ALT 25 29 29   ALKPHOS 85 64 60  BILITOT 0.6 0.4 0.5  PROT 9.0* 6.3* 5.7*  ALBUMIN 4.1 2.8* 2.7*   Recent Labs  Lab 09/06/23 1158  LIPASE 27   No results for input(s): "AMMONIA" in the last 168 hours. CBC: Recent Labs  Lab 09/06/23 1158 09/07/23 0313 09/08/23 0316 09/09/23 0540 09/10/23 0819  WBC 15.5* 10.4 9.3 10.7* 10.7*  NEUTROABS 12.4* 7.0 5.0 6.2 6.4  HGB 14.2 11.9* 11.7* 13.3 13.1  HCT 46.4* 40.0 35.9* 41.3 40.5  MCV 89.2 91.5 88.2 86.0 85.6  PLT 306 210 193 244 248   Cardiac Enzymes: No results for input(s): "CKTOTAL", "CKMB", "CKMBINDEX",  "TROPONINI" in the last 168 hours. BNP: Invalid input(s): "POCBNP" CBG: Recent Labs  Lab 09/09/23 0733 09/09/23 1211 09/09/23 1625 09/09/23 2029 09/10/23 0732  GLUCAP 190* 209* 149* 145* 112*   D-Dimer No results for input(s): "DDIMER" in the last 72 hours. Hgb A1c No results for input(s): "HGBA1C" in the last 72 hours. Lipid Profile No results for input(s): "CHOL", "HDL", "LDLCALC", "TRIG", "CHOLHDL", "LDLDIRECT" in the last 72 hours. Thyroid function studies No results for input(s): "TSH", "T4TOTAL", "T3FREE", "THYROIDAB" in the last 72 hours.  Invalid input(s): "FREET3" Anemia work up No results for input(s): "VITAMINB12", "FOLATE", "FERRITIN", "TIBC", "IRON", "RETICCTPCT" in the last 72 hours. Urinalysis    Component Value Date/Time   COLORURINE AMBER (A) 09/06/2023 1224   APPEARANCEUR CLOUDY (A) 09/06/2023 1224   APPEARANCEUR Clear 11/24/2012 1238   LABSPEC 1.018 09/06/2023 1224   LABSPEC 1.000 11/24/2012 1238   PHURINE 6.0 09/06/2023 1224   GLUCOSEU >=500 (A) 09/06/2023 1224   GLUCOSEU >=500 11/24/2012 1238   HGBUR LARGE (A) 09/06/2023 1224   BILIRUBINUR NEGATIVE 09/06/2023 1224   BILIRUBINUR Negative 11/24/2012 1238   KETONESUR NEGATIVE 09/06/2023 1224   PROTEINUR 100 (A) 09/06/2023 1224   UROBILINOGEN 1.0 04/30/2010 0902   NITRITE NEGATIVE 09/06/2023 1224   LEUKOCYTESUR LARGE (A) 09/06/2023 1224   LEUKOCYTESUR Negative 11/24/2012 1238   Sepsis Labs Recent Labs  Lab 09/07/23 0313 09/08/23 0316 09/09/23 0540 09/10/23 0819  WBC 10.4 9.3 10.7* 10.7*   Microbiology Recent Results (from the past 240 hour(s))  Culture, blood (routine x 2)     Status: None (Preliminary result)  Collection Time: 09/06/23 11:58 AM   Specimen: BLOOD  Result Value Ref Range Status   Specimen Description   Final    BLOOD RIGHT ANTECUBITAL Performed at [facility], [address], [place], [place] [address]    Special Requests   Final    BOTTLES DRAWN  AEROBIC AND ANAEROBIC Blood Culture adequate volume Performed at [facility], [address], [place], [place] [address]    Culture   Final    NO GROWTH 4 DAYS Performed at [facility], [address], [place], [place] [address]    Report Status PENDING  Incomplete  Resp panel by RT-PCR (RSV, Flu A&B, Covid) Anterior Nasal Swab     Status: None   Collection Time: 09/06/23 12:17 PM   Specimen: Anterior Nasal Swab  Result Value Ref Range Status   SARS Coronavirus 2 by RT PCR NEGATIVE NEGATIVE Final    Comment: (NOTE) SARS-CoV-2 target nucleic acids are NOT DETECTED.  The SARS-CoV-2 RNA is generally detectable in upper respiratory specimens during the acute phase of infection. The lowest concentration of SARS-CoV-2 viral copies this assay can detect is 138 copies/mL. A negative result does not preclude SARS-Cov-2 infection and should not be used as the sole basis for treatment or other patient management decisions. A negative result may occur with  improper specimen collection/handling, submission of specimen other than nasopharyngeal swab, presence of viral mutation(s) within the areas targeted by this assay, and inadequate number of viral copies(<138 copies/mL). A negative result must be combined with clinical observations, patient history, and epidemiological information. The expected result is Negative.  Fact Sheet for Patients:  BloggerCourse.com  Fact Sheet for Healthcare Providers:  SeriousBroker.it  This test is no t yet approved or cleared by the [place] FDA and  has been authorized for detection and/or diagnosis of SARS-CoV-2 by FDA under an Emergency Use Authorization (EUA). This EUA will remain  in effect (meaning this test can be used) for the duration of the COVID-19 declaration under Section 564(b)(1) of the Act, 21 U.S.C.section 360bbb-3(b)(1), unless the authorization is terminated   or revoked sooner.       Influenza A by PCR NEGATIVE NEGATIVE Final   Influenza B by PCR NEGATIVE NEGATIVE Final    Comment: (NOTE) The Xpert Xpress SARS-CoV-2/FLU/RSV plus assay is intended as an aid in the diagnosis of influenza from Nasopharyngeal swab specimens and should not be used as a sole basis for treatment. Nasal washings and aspirates are unacceptable for Xpert Xpress SARS-CoV-2/FLU/RSV testing.  Fact Sheet for Patients: BloggerCourse.com  Fact Sheet for Healthcare Providers: SeriousBroker.it  This test is not yet approved or cleared by the [place] FDA and has been authorized for detection and/or diagnosis of SARS-CoV-2 by FDA under an Emergency Use Authorization (EUA). This EUA will remain in effect (meaning this test can be used) for the duration of the COVID-19 declaration under Section 564(b)(1) of the Act, 21 U.S.C. section 360bbb-3(b)(1), unless the authorization is terminated or revoked.     Resp Syncytial Virus by PCR NEGATIVE NEGATIVE Final    Comment: (NOTE) Fact Sheet for Patients: BloggerCourse.com  Fact Sheet for Healthcare Providers: SeriousBroker.it  This test is not yet approved or cleared by the [place] FDA and has been authorized for detection and/or diagnosis of SARS-CoV-2 by FDA under an Emergency Use Authorization (EUA). This EUA will remain in effect (meaning this test can be used) for the duration of the COVID-19 declaration under Section 564(b)(1)  of the Act, 21 U.S.C. section 360bbb-3(b)(1), unless the authorization is terminated or revoked.  Performed at [facility], [address] [place], [place] [address]   Urine Culture     Status: Abnormal   Collection Time: 09/06/23 12:24 PM   Specimen: Urine, Catheterized  Result Value Ref Range Status   Specimen Description   Final    URINE,  CATHETERIZED Performed at [facility], [address], [place], [place] [address]    Special Requests   Final    NONE Performed at [facility], [address], [place], [place] [address]    Culture (A)  Final    >=100,000 COLONIES/mL ESCHERICHIA COLI Confirmed Extended Spectrum Beta-Lactamase Producer (ESBL).  In bloodstream infections from ESBL organisms, carbapenems are preferred over piperacillin/tazobactam. They are shown to have a lower risk of mortality. KLEBSIELLA PNEUMONIAE    Report Status 09/10/2023 FINAL  Final   Organism ID, Bacteria ESCHERICHIA COLI (A)  Final   Organism ID, Bacteria KLEBSIELLA PNEUMONIAE  Final      Susceptibility   Escherichia coli - MIC*    AMPICILLIN >=32 RESISTANT Resistant     CEFAZOLIN >=64 RESISTANT Resistant     CEFEPIME 16 RESISTANT Resistant     CEFTRIAXONE >=64 RESISTANT Resistant     CIPROFLOXACIN <=0.25 SENSITIVE Sensitive     GENTAMICIN >=16 RESISTANT Resistant     IMIPENEM <=0.25 SENSITIVE Sensitive     NITROFURANTOIN <=16 SENSITIVE Sensitive     TRIMETH/SULFA >=320 RESISTANT Resistant     AMPICILLIN/SULBACTAM >=32 RESISTANT Resistant     PIP/TAZO <=4 SENSITIVE Sensitive ug/mL    * >=100,000 COLONIES/mL ESCHERICHIA COLI   Klebsiella pneumoniae - MIC*    AMPICILLIN RESISTANT Resistant     CEFAZOLIN <=4 SENSITIVE Sensitive     CEFEPIME <=0.12 SENSITIVE Sensitive     CEFTRIAXONE <=0.25 SENSITIVE Sensitive     CIPROFLOXACIN <=0.25 SENSITIVE Sensitive     GENTAMICIN <=1 SENSITIVE Sensitive     IMIPENEM <=0.25 SENSITIVE Sensitive     NITROFURANTOIN 64 INTERMEDIATE Intermediate     TRIMETH/SULFA <=20 SENSITIVE Sensitive     AMPICILLIN/SULBACTAM <=2 SENSITIVE Sensitive     PIP/TAZO <=4 SENSITIVE Sensitive ug/mL    * KLEBSIELLA PNEUMONIAE  Culture, blood (routine x 2)     Status: None (Preliminary result)   Collection Time: 09/06/23 12:35 PM   Specimen: BLOOD  Result Value Ref Range Status    Specimen Description   Final    BLOOD SITE NOT SPECIFIED Performed at [facility], [address], [place], [place] [address]    Special Requests   Final    BOTTLES DRAWN AEROBIC ONLY Blood Culture results may not be optimal due to an inadequate volume of blood received in culture bottles Performed at [facility], [address], [place], [place] [address]    Culture   Final    NO GROWTH 4 DAYS Performed at [facility], [address], [place], [place] [address]    Report Status PENDING  Incomplete  MRSA Next Gen by PCR, Nasal     Status: None   Collection Time: 09/06/23  4:37 PM   Specimen: Nasal Mucosa; Nasal Swab  Result Value Ref Range Status   MRSA by PCR Next Gen NOT DETECTED NOT DETECTED Final    Comment: (NOTE) The GeneXpert MRSA Assay (FDA approved for NASAL specimens only), is one component of a comprehensive MRSA colonization surveillance program. It is not  intended to diagnose MRSA infection nor to guide or monitor treatment for MRSA infections. Test performance is not FDA approved in patients less than 12 years old. Performed at [facility], [address] [place], [place] [address]     FURTHER DISCHARGE INSTRUCTIONS:   Get Medicines reviewed and adjusted: Please take all your medications with you for your next visit with your Primary MD   Laboratory/radiological data: Please request your Primary MD to go over all hospital tests and procedure/radiological results at the follow up, please ask your Primary MD to get all Hospital records sent to his/her office.   In some cases, they will be blood work, cultures and biopsy results pending at the time of your discharge. Please request that your primary care M.D. goes through all the records of your hospital data and follows up on these results.   Also Note the following: If you experience worsening of your admission symptoms, develop shortness of  breath, life threatening emergency, suicidal or homicidal thoughts you must seek medical attention immediately by calling 911 or calling your MD immediately  if symptoms less severe.   You must read complete instructions/literature along with all the possible adverse reactions/side effects for all the Medicines you take and that have been prescribed to you. Take any new Medicines after you have completely understood and accpet all the possible adverse reactions/side effects.    Do not drive when taking Pain medications or sleeping medications (Benzodaizepines)   Do not take more than prescribed Pain, Sleep and Anxiety Medications. It is not advisable to combine anxiety,sleep and pain medications without talking with your primary care practitioner   Special Instructions: If you have smoked or chewed Tobacco  in the last 2 yrs please stop smoking, stop any regular Alcohol  and or any Recreational drug use.   Wear Seat belts while driving.   Please note: You were cared for by a hospitalist during your hospital stay. Once you are discharged, your primary care physician will handle any further medical issues. Please note that NO REFILLS for any discharge medications will be authorized once you are discharged, as it is imperative that you return to your primary care physician (or establish a relationship with a primary care physician if you do not have one) for your post hospital discharge needs so that they can reassess your need for medications and monitor your lab values  Time coordinating discharge: Over 30 minutes  SIGNED:   Hughie Closs, MD  [facility] 09/10/2023, 1:53 PM *Please note that this is a verbal dictation therefore any spelling or grammatical errors are due to the "Dragon Medical One" system interpretation. If 7PM-7AM, please contact night-coverage [email or web address]

## 2023-09-11 ENCOUNTER — Other Ambulatory Visit (HOSPITAL_COMMUNITY): Payer: Self-pay

## 2023-09-11 LAB — CULTURE, BLOOD (ROUTINE X 2)
Culture: NO GROWTH
Culture: NO GROWTH
Special Requests: ADEQUATE

## 2023-10-16 ENCOUNTER — Inpatient Hospital Stay (HOSPITAL_COMMUNITY)
Admission: EM | Admit: 2023-10-16 | Discharge: 2023-10-24 | DRG: 872 | Disposition: A | Discharge status: Skilled Nursing Facility | Payer: Medicaid Other | Attending: Internal Medicine | Admitting: Internal Medicine

## 2023-10-16 DIAGNOSIS — Z79890 Hormone replacement therapy (postmenopausal): Secondary | ICD-10-CM

## 2023-10-16 DIAGNOSIS — Z1611 Resistance to penicillins: Secondary | ICD-10-CM | POA: Diagnosis present

## 2023-10-16 DIAGNOSIS — F419 Anxiety disorder, unspecified: Secondary | ICD-10-CM | POA: Diagnosis present

## 2023-10-16 DIAGNOSIS — F319 Bipolar disorder, unspecified: Secondary | ICD-10-CM | POA: Diagnosis present

## 2023-10-16 DIAGNOSIS — Z8619 Personal history of other infectious and parasitic diseases: Secondary | ICD-10-CM

## 2023-10-16 DIAGNOSIS — Z7985 Long-term (current) use of injectable non-insulin antidiabetic drugs: Secondary | ICD-10-CM

## 2023-10-16 DIAGNOSIS — R6520 Severe sepsis without septic shock: Secondary | ICD-10-CM | POA: Diagnosis present

## 2023-10-16 DIAGNOSIS — N390 Urinary tract infection, site not specified: Secondary | ICD-10-CM | POA: Diagnosis present

## 2023-10-16 DIAGNOSIS — A419 Sepsis, unspecified organism: Secondary | ICD-10-CM | POA: Diagnosis present

## 2023-10-16 DIAGNOSIS — R9431 Abnormal electrocardiogram [ECG] [EKG]: Secondary | ICD-10-CM | POA: Diagnosis present

## 2023-10-16 DIAGNOSIS — I8290 Acute embolism and thrombosis of unspecified vein: Secondary | ICD-10-CM | POA: Diagnosis present

## 2023-10-16 DIAGNOSIS — E872 Acidosis, unspecified: Secondary | ICD-10-CM | POA: Diagnosis present

## 2023-10-16 DIAGNOSIS — Z1152 Encounter for screening for COVID-19: Secondary | ICD-10-CM

## 2023-10-16 DIAGNOSIS — J4489 Other specified chronic obstructive pulmonary disease: Secondary | ICD-10-CM | POA: Diagnosis present

## 2023-10-16 DIAGNOSIS — Z7982 Long term (current) use of aspirin: Secondary | ICD-10-CM

## 2023-10-16 DIAGNOSIS — G8929 Other chronic pain: Secondary | ICD-10-CM | POA: Diagnosis present

## 2023-10-16 DIAGNOSIS — I10 Essential (primary) hypertension: Secondary | ICD-10-CM | POA: Diagnosis present

## 2023-10-16 DIAGNOSIS — N2889 Other specified disorders of kidney and ureter: Secondary | ICD-10-CM | POA: Diagnosis present

## 2023-10-16 DIAGNOSIS — Z7951 Long term (current) use of inhaled steroids: Secondary | ICD-10-CM

## 2023-10-16 DIAGNOSIS — Z881 Allergy status to other antibiotic agents status: Secondary | ICD-10-CM

## 2023-10-16 DIAGNOSIS — E1169 Type 2 diabetes mellitus with other specified complication: Secondary | ICD-10-CM | POA: Diagnosis present

## 2023-10-16 DIAGNOSIS — Z9981 Dependence on supplemental oxygen: Secondary | ICD-10-CM

## 2023-10-16 DIAGNOSIS — Z7901 Long term (current) use of anticoagulants: Secondary | ICD-10-CM

## 2023-10-16 DIAGNOSIS — F32A Depression, unspecified: Secondary | ICD-10-CM | POA: Diagnosis present

## 2023-10-16 DIAGNOSIS — E66811 Obesity, class 1: Secondary | ICD-10-CM | POA: Diagnosis present

## 2023-10-16 DIAGNOSIS — Z87891 Personal history of nicotine dependence: Secondary | ICD-10-CM

## 2023-10-16 DIAGNOSIS — Z8249 Family history of ischemic heart disease and other diseases of the circulatory system: Secondary | ICD-10-CM

## 2023-10-16 DIAGNOSIS — Z86711 Personal history of pulmonary embolism: Secondary | ICD-10-CM

## 2023-10-16 DIAGNOSIS — E782 Mixed hyperlipidemia: Secondary | ICD-10-CM | POA: Diagnosis present

## 2023-10-16 DIAGNOSIS — I5032 Chronic diastolic (congestive) heart failure: Secondary | ICD-10-CM | POA: Diagnosis present

## 2023-10-16 DIAGNOSIS — Z794 Long term (current) use of insulin: Secondary | ICD-10-CM

## 2023-10-16 DIAGNOSIS — E1140 Type 2 diabetes mellitus with diabetic neuropathy, unspecified: Secondary | ICD-10-CM | POA: Diagnosis present

## 2023-10-16 DIAGNOSIS — N1831 Chronic kidney disease, stage 3a: Secondary | ICD-10-CM | POA: Diagnosis present

## 2023-10-16 DIAGNOSIS — Z86718 Personal history of other venous thrombosis and embolism: Secondary | ICD-10-CM

## 2023-10-16 DIAGNOSIS — K5909 Other constipation: Secondary | ICD-10-CM | POA: Diagnosis present

## 2023-10-16 DIAGNOSIS — B957 Other staphylococcus as the cause of diseases classified elsewhere: Secondary | ICD-10-CM | POA: Diagnosis present

## 2023-10-16 DIAGNOSIS — Z833 Family history of diabetes mellitus: Secondary | ICD-10-CM

## 2023-10-16 DIAGNOSIS — M545 Low back pain, unspecified: Secondary | ICD-10-CM | POA: Diagnosis present

## 2023-10-16 DIAGNOSIS — Z91018 Allergy to other foods: Secondary | ICD-10-CM

## 2023-10-16 DIAGNOSIS — I130 Hypertensive heart and chronic kidney disease with heart failure and stage 1 through stage 4 chronic kidney disease, or unspecified chronic kidney disease: Secondary | ICD-10-CM | POA: Diagnosis present

## 2023-10-16 DIAGNOSIS — K219 Gastro-esophageal reflux disease without esophagitis: Secondary | ICD-10-CM | POA: Diagnosis present

## 2023-10-16 DIAGNOSIS — E1122 Type 2 diabetes mellitus with diabetic chronic kidney disease: Secondary | ICD-10-CM | POA: Diagnosis present

## 2023-10-16 DIAGNOSIS — B377 Candidal sepsis: Principal | ICD-10-CM | POA: Diagnosis present

## 2023-10-16 DIAGNOSIS — I69354 Hemiplegia and hemiparesis following cerebral infarction affecting left non-dominant side: Secondary | ICD-10-CM

## 2023-10-16 DIAGNOSIS — Z6832 Body mass index (BMI) 32.0-32.9, adult: Secondary | ICD-10-CM

## 2023-10-16 DIAGNOSIS — E1165 Type 2 diabetes mellitus with hyperglycemia: Secondary | ICD-10-CM | POA: Diagnosis present

## 2023-10-16 DIAGNOSIS — Z888 Allergy status to other drugs, medicaments and biological substances status: Secondary | ICD-10-CM

## 2023-10-16 DIAGNOSIS — Z79899 Other long term (current) drug therapy: Secondary | ICD-10-CM

## 2023-10-16 DIAGNOSIS — I5189 Other ill-defined heart diseases: Secondary | ICD-10-CM

## 2023-10-16 DIAGNOSIS — N3001 Acute cystitis with hematuria: Secondary | ICD-10-CM | POA: Diagnosis present

## 2023-10-16 DIAGNOSIS — N179 Acute kidney failure, unspecified: Principal | ICD-10-CM | POA: Diagnosis present

## 2023-10-16 DIAGNOSIS — Z825 Family history of asthma and other chronic lower respiratory diseases: Secondary | ICD-10-CM

## 2023-10-16 DIAGNOSIS — E785 Hyperlipidemia, unspecified: Secondary | ICD-10-CM | POA: Diagnosis present

## 2023-10-16 DIAGNOSIS — E039 Hypothyroidism, unspecified: Secondary | ICD-10-CM | POA: Diagnosis present

## 2023-10-16 DIAGNOSIS — Z87440 Personal history of urinary (tract) infections: Secondary | ICD-10-CM

## 2023-10-16 DIAGNOSIS — E6609 Other obesity due to excess calories: Secondary | ICD-10-CM | POA: Diagnosis present

## 2023-10-16 DIAGNOSIS — J9611 Chronic respiratory failure with hypoxia: Secondary | ICD-10-CM | POA: Diagnosis present

## 2023-10-16 HISTORY — DX: Allergy, unspecified, initial encounter: T78.40XA

## 2023-10-16 NOTE — ED Triage Notes (Signed)
 Patient arrived from facility with complaints of headache, N/V, chills, and fever.

## 2023-10-17 ENCOUNTER — Emergency Department (HOSPITAL_COMMUNITY): Payer: Medicaid Other

## 2023-10-17 ENCOUNTER — Encounter (HOSPITAL_COMMUNITY): Payer: Self-pay | Admitting: Internal Medicine

## 2023-10-17 ENCOUNTER — Other Ambulatory Visit: Payer: Self-pay

## 2023-10-17 DIAGNOSIS — E039 Hypothyroidism, unspecified: Secondary | ICD-10-CM | POA: Diagnosis present

## 2023-10-17 DIAGNOSIS — E872 Acidosis, unspecified: Secondary | ICD-10-CM | POA: Diagnosis present

## 2023-10-17 DIAGNOSIS — I130 Hypertensive heart and chronic kidney disease with heart failure and stage 1 through stage 4 chronic kidney disease, or unspecified chronic kidney disease: Secondary | ICD-10-CM | POA: Diagnosis present

## 2023-10-17 DIAGNOSIS — F319 Bipolar disorder, unspecified: Secondary | ICD-10-CM | POA: Diagnosis present

## 2023-10-17 DIAGNOSIS — I38 Endocarditis, valve unspecified: Secondary | ICD-10-CM | POA: Diagnosis not present

## 2023-10-17 DIAGNOSIS — R4689 Other symptoms and signs involving appearance and behavior: Secondary | ICD-10-CM | POA: Insufficient documentation

## 2023-10-17 DIAGNOSIS — F419 Anxiety disorder, unspecified: Secondary | ICD-10-CM | POA: Diagnosis present

## 2023-10-17 DIAGNOSIS — K219 Gastro-esophageal reflux disease without esophagitis: Secondary | ICD-10-CM | POA: Diagnosis present

## 2023-10-17 DIAGNOSIS — N179 Acute kidney failure, unspecified: Secondary | ICD-10-CM | POA: Diagnosis present

## 2023-10-17 DIAGNOSIS — I10 Essential (primary) hypertension: Secondary | ICD-10-CM | POA: Diagnosis not present

## 2023-10-17 DIAGNOSIS — I5032 Chronic diastolic (congestive) heart failure: Secondary | ICD-10-CM | POA: Diagnosis present

## 2023-10-17 DIAGNOSIS — B377 Candidal sepsis: Secondary | ICD-10-CM | POA: Diagnosis present

## 2023-10-17 DIAGNOSIS — A419 Sepsis, unspecified organism: Secondary | ICD-10-CM

## 2023-10-17 DIAGNOSIS — E1165 Type 2 diabetes mellitus with hyperglycemia: Secondary | ICD-10-CM | POA: Diagnosis present

## 2023-10-17 DIAGNOSIS — R112 Nausea with vomiting, unspecified: Secondary | ICD-10-CM | POA: Diagnosis present

## 2023-10-17 DIAGNOSIS — N390 Urinary tract infection, site not specified: Secondary | ICD-10-CM | POA: Diagnosis not present

## 2023-10-17 DIAGNOSIS — N3001 Acute cystitis with hematuria: Secondary | ICD-10-CM | POA: Diagnosis present

## 2023-10-17 DIAGNOSIS — E66811 Obesity, class 1: Secondary | ICD-10-CM | POA: Diagnosis present

## 2023-10-17 DIAGNOSIS — R9431 Abnormal electrocardiogram [ECG] [EKG]: Secondary | ICD-10-CM | POA: Diagnosis present

## 2023-10-17 DIAGNOSIS — Z87891 Personal history of nicotine dependence: Secondary | ICD-10-CM | POA: Diagnosis not present

## 2023-10-17 DIAGNOSIS — J4489 Other specified chronic obstructive pulmonary disease: Secondary | ICD-10-CM | POA: Diagnosis present

## 2023-10-17 DIAGNOSIS — Z1611 Resistance to penicillins: Secondary | ICD-10-CM | POA: Diagnosis present

## 2023-10-17 DIAGNOSIS — E1122 Type 2 diabetes mellitus with diabetic chronic kidney disease: Secondary | ICD-10-CM | POA: Diagnosis present

## 2023-10-17 DIAGNOSIS — R6520 Severe sepsis without septic shock: Secondary | ICD-10-CM | POA: Diagnosis present

## 2023-10-17 DIAGNOSIS — I340 Nonrheumatic mitral (valve) insufficiency: Secondary | ICD-10-CM | POA: Diagnosis not present

## 2023-10-17 DIAGNOSIS — B49 Unspecified mycosis: Secondary | ICD-10-CM | POA: Diagnosis not present

## 2023-10-17 DIAGNOSIS — E1140 Type 2 diabetes mellitus with diabetic neuropathy, unspecified: Secondary | ICD-10-CM | POA: Diagnosis present

## 2023-10-17 DIAGNOSIS — Z794 Long term (current) use of insulin: Secondary | ICD-10-CM | POA: Diagnosis not present

## 2023-10-17 DIAGNOSIS — N1831 Chronic kidney disease, stage 3a: Secondary | ICD-10-CM | POA: Diagnosis present

## 2023-10-17 DIAGNOSIS — J9611 Chronic respiratory failure with hypoxia: Secondary | ICD-10-CM | POA: Diagnosis present

## 2023-10-17 DIAGNOSIS — I129 Hypertensive chronic kidney disease with stage 1 through stage 4 chronic kidney disease, or unspecified chronic kidney disease: Secondary | ICD-10-CM | POA: Diagnosis not present

## 2023-10-17 DIAGNOSIS — I69354 Hemiplegia and hemiparesis following cerebral infarction affecting left non-dominant side: Secondary | ICD-10-CM | POA: Diagnosis not present

## 2023-10-17 DIAGNOSIS — Z1152 Encounter for screening for COVID-19: Secondary | ICD-10-CM | POA: Diagnosis not present

## 2023-10-17 DIAGNOSIS — I8290 Acute embolism and thrombosis of unspecified vein: Secondary | ICD-10-CM | POA: Diagnosis not present

## 2023-10-17 DIAGNOSIS — E782 Mixed hyperlipidemia: Secondary | ICD-10-CM | POA: Diagnosis present

## 2023-10-17 DIAGNOSIS — E1169 Type 2 diabetes mellitus with other specified complication: Secondary | ICD-10-CM | POA: Diagnosis present

## 2023-10-17 LAB — CBC
HCT: 41.1 % (ref 36.0–46.0)
Hemoglobin: 13.2 g/dL (ref 12.0–15.0)
MCH: 27.7 pg (ref 26.0–34.0)
MCHC: 32.1 g/dL (ref 30.0–36.0)
MCV: 86.3 fL (ref 80.0–100.0)
Platelets: 232 10*3/uL (ref 150–400)
RBC: 4.76 MIL/uL (ref 3.87–5.11)
RDW: 14 % (ref 11.5–15.5)
WBC: 17 10*3/uL — ABNORMAL HIGH (ref 4.0–10.5)
nRBC: 0 % (ref 0.0–0.2)

## 2023-10-17 LAB — URINALYSIS, W/ REFLEX TO CULTURE (INFECTION SUSPECTED)
Bilirubin Urine: NEGATIVE
Glucose, UA: 50 mg/dL — AB
Ketones, ur: NEGATIVE mg/dL
Leukocytes,Ua: NEGATIVE
Nitrite: NEGATIVE
Protein, ur: >=300 mg/dL — AB
RBC / HPF: >50 RBC/hpf (ref 0–5)
Specific Gravity, Urine: 1.017 (ref 1.005–1.030)
pH: 5 (ref 5.0–8.0)

## 2023-10-17 LAB — RESP PANEL BY RT-PCR (RSV, FLU A&B, COVID)  RVPGX2
Influenza A by PCR: NEGATIVE
Influenza B by PCR: NEGATIVE
Resp Syncytial Virus by PCR: NEGATIVE
SARS Coronavirus 2 by RT PCR: NEGATIVE

## 2023-10-17 LAB — LIPASE, BLOOD: Lipase: 22 U/L (ref 11–51)

## 2023-10-17 LAB — I-STAT CG4 LACTIC ACID, ED
Lactic Acid, Venous: 2.2 mmol/L (ref 0.5–1.9)
Lactic Acid, Venous: 2.2 mmol/L (ref 0.5–1.9)

## 2023-10-17 LAB — COMPREHENSIVE METABOLIC PANEL
ALT: 14 U/L (ref 0–44)
AST: 22 U/L (ref 15–41)
Albumin: 4 g/dL (ref 3.5–5.0)
Alkaline Phosphatase: 64 U/L (ref 38–126)
Anion gap: 9 (ref 5–15)
BUN: 25 mg/dL — ABNORMAL HIGH (ref 6–20)
CO2: 25 mmol/L (ref 22–32)
Calcium: 8.9 mg/dL (ref 8.9–10.3)
Chloride: 96 mmol/L — ABNORMAL LOW (ref 98–111)
Creatinine, Ser: 1.44 mg/dL — ABNORMAL HIGH (ref 0.44–1.00)
GFR, Estimated: 44 mL/min — ABNORMAL LOW (ref >60)
Glucose, Bld: 271 mg/dL — ABNORMAL HIGH (ref 70–99)
Potassium: 5 mmol/L (ref 3.5–5.1)
Sodium: 130 mmol/L — ABNORMAL LOW (ref 135–145)
Total Bilirubin: 0.7 mg/dL (ref 0.0–1.2)
Total Protein: 7.6 g/dL (ref 6.5–8.1)

## 2023-10-17 LAB — CBG MONITORING, ED
Glucose-Capillary: 206 mg/dL — ABNORMAL HIGH (ref 70–99)
Glucose-Capillary: 176 mg/dL — ABNORMAL HIGH (ref 70–99)

## 2023-10-17 LAB — PROTIME-INR
INR: 1.6 — ABNORMAL HIGH (ref 0.8–1.2)
Prothrombin Time: 18.9 s — ABNORMAL HIGH (ref 11.4–15.2)

## 2023-10-17 LAB — GLUCOSE, CAPILLARY
Glucose-Capillary: 232 mg/dL — ABNORMAL HIGH (ref 70–99)
Glucose-Capillary: 150 mg/dL — ABNORMAL HIGH (ref 70–99)

## 2023-10-17 LAB — APTT: aPTT: 38 s — ABNORMAL HIGH (ref 24–36)

## 2023-10-17 LAB — HEMOGLOBIN AND HEMATOCRIT, BLOOD
HCT: 34.8 % — ABNORMAL LOW (ref 36.0–46.0)
Hemoglobin: 11.4 g/dL — ABNORMAL LOW (ref 12.0–15.0)

## 2023-10-17 LAB — LACTIC ACID, PLASMA: Lactic Acid, Venous: 0.9 mmol/L (ref 0.5–1.9)

## 2023-10-17 MED ORDER — PANTOPRAZOLE SODIUM 40 MG IV SOLR
40.0000 mg | Freq: Once | INTRAVENOUS | Status: AC
  Administered 2023-10-17: 40 mg via INTRAVENOUS
  Filled 2023-10-17: qty 10

## 2023-10-17 MED ORDER — EZETIMIBE 10 MG PO TABS
10.0000 mg | ORAL_TABLET | Freq: Every day | ORAL | Status: DC
Start: 2023-10-17 — End: 2023-10-25
  Administered 2023-10-17 – 2023-10-24 (×8): 10 mg via ORAL
  Filled 2023-10-17 (×8): qty 1

## 2023-10-17 MED ORDER — LACTATED RINGERS IV BOLUS (SEPSIS)
1000.0000 mL | Freq: Once | INTRAVENOUS | Status: AC
  Administered 2023-10-17: 1000 mL via INTRAVENOUS

## 2023-10-17 MED ORDER — ONDANSETRON HCL 4 MG/2ML IJ SOLN: 4.0000 mg | Freq: Four times a day (QID) | INTRAMUSCULAR | Status: DC | PRN

## 2023-10-17 MED ORDER — MELATONIN 5 MG PO TABS
5.0000 mg | ORAL_TABLET | Freq: Every day | ORAL | Status: DC
Start: 2023-10-17 — End: 2023-10-25
  Administered 2023-10-17 – 2023-10-24 (×8): 5 mg via ORAL
  Filled 2023-10-17 (×8): qty 1

## 2023-10-17 MED ORDER — ACETAMINOPHEN 325 MG PO TABS
650.0000 mg | ORAL_TABLET | Freq: Four times a day (QID) | ORAL | Status: DC | PRN
  Administered 2023-10-17 – 2023-10-24 (×7): 650 mg via ORAL
  Filled 2023-10-17 (×7): qty 2

## 2023-10-17 MED ORDER — ATENOLOL 50 MG PO TABS
50.0000 mg | ORAL_TABLET | Freq: Two times a day (BID) | ORAL | Status: DC
Start: 2023-10-17 — End: 2023-10-25
  Administered 2023-10-17 – 2023-10-24 (×15): 50 mg via ORAL
  Filled 2023-10-17 (×15): qty 1

## 2023-10-17 MED ORDER — FENTANYL CITRATE PF 50 MCG/ML IJ SOSY
25.0000 ug | PREFILLED_SYRINGE | INTRAMUSCULAR | Status: AC | PRN
  Administered 2023-10-18 – 2023-10-19 (×3): 25 ug via INTRAVENOUS
  Filled 2023-10-17 (×3): qty 1

## 2023-10-17 MED ORDER — MOMETASONE FURO-FORMOTEROL FUM 100-5 MCG/ACT IN AERO
2.0000 | INHALATION_SPRAY | Freq: Two times a day (BID) | RESPIRATORY_TRACT | Status: DC
  Administered 2023-10-18 – 2023-10-24 (×14): 2 via RESPIRATORY_TRACT
  Filled 2023-10-17: qty 8.8

## 2023-10-17 MED ORDER — LEVOTHYROXINE SODIUM 88 MCG PO TABS
88.0000 ug | ORAL_TABLET | Freq: Every day | ORAL | Status: DC
Start: 2023-10-18 — End: 2023-10-25
  Administered 2023-10-18 – 2023-10-24 (×6): 88 ug via ORAL
  Filled 2023-10-17 (×7): qty 1

## 2023-10-17 MED ORDER — QUETIAPINE FUMARATE 25 MG PO TABS
25.0000 mg | ORAL_TABLET | Freq: Every day | ORAL | Status: DC
Start: 2023-10-17 — End: 2023-10-25
  Administered 2023-10-17 – 2023-10-24 (×8): 25 mg via ORAL
  Filled 2023-10-17 (×8): qty 1

## 2023-10-17 MED ORDER — INSULIN GLARGINE-YFGN 100 UNIT/ML ~~LOC~~ SOLN
10.0000 [IU] | Freq: Every day | SUBCUTANEOUS | Status: DC
  Administered 2023-10-17 – 2023-10-24 (×8): 10 [IU] via SUBCUTANEOUS
  Filled 2023-10-17 (×9): qty 0.1

## 2023-10-17 MED ORDER — GABAPENTIN 400 MG PO CAPS
400.0000 mg | ORAL_CAPSULE | Freq: Three times a day (TID) | ORAL | Status: DC
  Administered 2023-10-17 – 2023-10-24 (×22): 400 mg via ORAL
  Filled 2023-10-17 (×22): qty 1

## 2023-10-17 MED ORDER — PANTOPRAZOLE SODIUM 20 MG PO TBEC
20.0000 mg | DELAYED_RELEASE_TABLET | Freq: Every day | ORAL | Status: DC
  Administered 2023-10-17 – 2023-10-24 (×8): 20 mg via ORAL
  Filled 2023-10-17 (×8): qty 1

## 2023-10-17 MED ORDER — INSULIN ASPART 100 UNIT/ML IJ SOLN
0.0000 [IU] | Freq: Three times a day (TID) | INTRAMUSCULAR | Status: DC
  Administered 2023-10-17 (×2): 5 [IU] via SUBCUTANEOUS
  Administered 2023-10-17 – 2023-10-18 (×2): 3 [IU] via SUBCUTANEOUS
  Administered 2023-10-18: 2 [IU] via SUBCUTANEOUS
  Administered 2023-10-18: 3 [IU] via SUBCUTANEOUS
  Administered 2023-10-19: 2 [IU] via SUBCUTANEOUS
  Administered 2023-10-19 (×2): 3 [IU] via SUBCUTANEOUS
  Administered 2023-10-20: 2 [IU] via SUBCUTANEOUS
  Administered 2023-10-20 (×2): 3 [IU] via SUBCUTANEOUS
  Administered 2023-10-21 (×2): 2 [IU] via SUBCUTANEOUS
  Administered 2023-10-22: 5 [IU] via SUBCUTANEOUS
  Administered 2023-10-22: 3 [IU] via SUBCUTANEOUS
  Administered 2023-10-22: 4 [IU] via SUBCUTANEOUS
  Administered 2023-10-23: 2 [IU] via SUBCUTANEOUS
  Administered 2023-10-23 – 2023-10-24 (×3): 3 [IU] via SUBCUTANEOUS
  Administered 2023-10-24 (×2): 5 [IU] via SUBCUTANEOUS
  Filled 2023-10-17: qty 0.15

## 2023-10-17 MED ORDER — ACETAMINOPHEN 650 MG RE SUPP: 650.0000 mg | Freq: Four times a day (QID) | RECTAL | Status: DC | PRN

## 2023-10-17 MED ORDER — FENTANYL CITRATE PF 50 MCG/ML IJ SOSY
PREFILLED_SYRINGE | INTRAMUSCULAR | Status: AC
  Administered 2023-10-17: 50 ug via INTRAVENOUS
  Filled 2023-10-17: qty 1

## 2023-10-17 MED ORDER — SODIUM CHLORIDE 0.9 % IV SOLN
2.0000 g | Freq: Once | INTRAVENOUS | Status: AC
  Administered 2023-10-17: 2 g via INTRAVENOUS
  Filled 2023-10-17: qty 20

## 2023-10-17 MED ORDER — FLUOXETINE HCL 20 MG PO CAPS
20.0000 mg | ORAL_CAPSULE | Freq: Every day | ORAL | Status: DC
  Administered 2023-10-17 – 2023-10-24 (×8): 20 mg via ORAL
  Filled 2023-10-17 (×8): qty 1

## 2023-10-17 MED ORDER — FENTANYL CITRATE PF 50 MCG/ML IJ SOSY: 50.0000 ug | PREFILLED_SYRINGE | Freq: Once | INTRAMUSCULAR | Status: AC

## 2023-10-17 MED ORDER — SODIUM CHLORIDE 0.9 % IV SOLN
1.0000 g | Freq: Three times a day (TID) | INTRAVENOUS | Status: DC
  Administered 2023-10-17 – 2023-10-20 (×9): 1 g via INTRAVENOUS
  Filled 2023-10-17 (×12): qty 20

## 2023-10-17 MED ORDER — LACTATED RINGERS IV SOLN: INTRAVENOUS | Status: DC

## 2023-10-17 MED ORDER — TIOTROPIUM BROMIDE MONOHYDRATE 2.5 MCG/ACT IN AERS: 2.0000 | INHALATION_SPRAY | Freq: Every morning | RESPIRATORY_TRACT | Status: DC

## 2023-10-17 MED ORDER — APIXABAN 5 MG PO TABS: 5.0000 mg | ORAL_TABLET | Freq: Two times a day (BID) | ORAL | Status: DC

## 2023-10-17 MED ORDER — ONDANSETRON HCL 4 MG PO TABS: 4.0000 mg | ORAL_TABLET | Freq: Four times a day (QID) | ORAL | Status: DC | PRN

## 2023-10-17 MED ORDER — LACTATED RINGERS IV BOLUS
250.0000 mL | Freq: Once | INTRAVENOUS | Status: AC
  Administered 2023-10-17: 250 mL via INTRAVENOUS

## 2023-10-17 MED ORDER — ROPINIROLE HCL 0.25 MG PO TABS
0.2500 mg | ORAL_TABLET | Freq: Every day | ORAL | Status: DC
Start: 2023-10-17 — End: 2023-10-25
  Administered 2023-10-17 – 2023-10-24 (×8): 0.25 mg via ORAL
  Filled 2023-10-17 (×8): qty 1

## 2023-10-17 MED ORDER — LACTATED RINGERS IV SOLN: INTRAVENOUS | Status: AC

## 2023-10-17 MED ORDER — PROCHLORPERAZINE EDISYLATE 10 MG/2ML IJ SOLN: 10.0000 mg | Freq: Four times a day (QID) | INTRAMUSCULAR | Status: DC | PRN

## 2023-10-17 MED ORDER — ACETAMINOPHEN 325 MG PO TABS
650.0000 mg | ORAL_TABLET | Freq: Once | ORAL | Status: AC
  Administered 2023-10-17: 650 mg via ORAL
  Filled 2023-10-17: qty 2

## 2023-10-17 MED ORDER — UMECLIDINIUM BROMIDE 62.5 MCG/ACT IN AEPB
1.0000 | INHALATION_SPRAY | Freq: Every day | RESPIRATORY_TRACT | Status: DC
  Administered 2023-10-18 – 2023-10-24 (×6): 1 via RESPIRATORY_TRACT
  Filled 2023-10-17 (×2): qty 7

## 2023-10-17 MED ORDER — SODIUM CHLORIDE 0.9 % IV SOLN: 2.0000 g | INTRAVENOUS | Status: DC

## 2023-10-17 MED ORDER — KETOROLAC TROMETHAMINE 15 MG/ML IJ SOLN
15.0000 mg | Freq: Once | INTRAMUSCULAR | Status: AC
  Administered 2023-10-17: 15 mg via INTRAVENOUS
  Filled 2023-10-17: qty 1

## 2023-10-17 MED ORDER — ATORVASTATIN CALCIUM 40 MG PO TABS
80.0000 mg | ORAL_TABLET | Freq: Every day | ORAL | Status: DC
Start: 2023-10-17 — End: 2023-10-25
  Administered 2023-10-17 – 2023-10-24 (×8): 80 mg via ORAL
  Filled 2023-10-17 (×8): qty 2

## 2023-10-17 MED ORDER — PROCHLORPERAZINE EDISYLATE 10 MG/2ML IJ SOLN
10.0000 mg | Freq: Once | INTRAMUSCULAR | Status: AC
  Administered 2023-10-17: 10 mg via INTRAVENOUS
  Filled 2023-10-17: qty 2

## 2023-10-17 MED ORDER — FENTANYL CITRATE PF 50 MCG/ML IJ SOSY
50.0000 ug | PREFILLED_SYRINGE | Freq: Once | INTRAMUSCULAR | Status: AC
  Administered 2023-10-17: 50 ug via INTRAVENOUS
  Filled 2023-10-17: qty 1

## 2023-10-17 MED ORDER — MAGNESIUM OXIDE -MG SUPPLEMENT 400 (240 MG) MG PO TABS
400.0000 mg | ORAL_TABLET | ORAL | Status: DC
Start: 2023-10-18 — End: 2023-10-25
  Administered 2023-10-18 – 2023-10-24 (×4): 400 mg via ORAL
  Filled 2023-10-17 (×4): qty 1

## 2023-10-17 NOTE — Plan of Care (Signed)
  Problem: Fluid Volume: Goal: Ability to maintain a balanced intake and output will improve Outcome: Progressing   Problem: Nutritional: Goal: Maintenance of adequate nutrition will improve Outcome: Progressing   Problem: Skin Integrity: Goal: Risk for impaired skin integrity will decrease Outcome: Progressing   Problem: Clinical Measurements: Goal: Respiratory complications will improve Outcome: Progressing   Problem: Clinical Measurements: Goal: Cardiovascular complication will be avoided Outcome: Progressing

## 2023-10-17 NOTE — Progress Notes (Signed)
 Pharmacy Antibiotic Note  Karla Hoover is a 53 y.o. female admitted on 10/16/2023 with sepsis.  Initially started on Rocephin  in ED.  Pharmacy has been consulted for meropenem  dosing for this patient with suspected UTI and history of ESBL E Coli UTI.  Plan: Discontinue Rocephin  Meropenem  1 g IV every 8 hours Monitor clinical progress, renal function F/U C&S, abx deescalation / LOT      Temp (24hrs), Avg:100.2 F (37.9 C), Min:98.6 F (37 C), Max:101.6 F (38.7 C)  Recent Labs  Lab 10/17/23 0107 10/17/23 0125 10/17/23 0302  WBC 17.0*  --   --   CREATININE 1.44*  --   --   LATICACIDVEN  --  2.2* 2.2*    CrCl cannot be calculated (Unknown ideal weight.).    Allergies  Allergen Reactions   Cipro  [Ciprofloxacin  Hcl] Hives   Guaifenesin  Anaphylaxis   Kiwi Extract Anaphylaxis and Rash   Levaquin [Levofloxacin] Shortness Of Breath and Rash   Strawberry Extract Anaphylaxis and Rash   Lioresal [Baclofen] Other (See Comments)    Near syncope/ fall    Antimicrobials this admission: 1/10 Rocephin  2 g IV x 1 1/10 Meropenem  >>   Microbiology results: 1/10 BCx: sent 10/10 UCx: sent   Thank you for allowing pharmacy to be a part of this patient's care.  Eleanor EMERSON Agent, PharmD, BCPS Clinical Pharmacist [place] 10/17/2023 9:08 AM

## 2023-10-17 NOTE — Progress Notes (Signed)
 YELLOW MEWS protocol initiated for patient. MD made aware. Interventions ordered and executed. Will continue to monitor patient.    10/17/23 1628  Vitals  Temp (!) 102.6 F (39.2 C)  Temp Source Oral  BP (!) 166/81  MAP (mmHg) 106  BP Location Right Arm  BP Method Automatic  Patient Position (if appropriate) Lying  Pulse Rate (!) 109  MEWS COLOR  MEWS Score Color Yellow  Oxygen  Therapy  SpO2 94 %  O2 Device Nasal Cannula  O2 Flow Rate (L/min) 3 L/min  Pain Assessment  Pain Scale 0-10  Pain Score 8  Pain Location Generalized  Pain Descriptors / Indicators Aching  Pain Frequency Constant  Pain Onset On-going  Patients Stated Pain Goal 0  Pain Intervention(s) Medication (See eMAR)  Height and Weight  Height 5' 7.5 (1.715 m)  Weight 94 kg  Type of Scale Used Bed  Type of Weight Actual  BSA (Calculated - sq m) 2.12 sq meters  BMI (Calculated) 31.95  Weight in (lb) to have BMI = 25 161.7  ECG Monitoring  PR interval 315  QRS interval 0.09  QT interval 0.33  QTc interval 0.44  CV Strip Heart Rate 109  Cardiac Rhythm ST  MEWS Score  MEWS Temp 2  MEWS Systolic 0  MEWS Pulse 1  MEWS RR 0  MEWS LOC 0  MEWS Score 3

## 2023-10-17 NOTE — ED Provider Notes (Signed)
 [place] EMERGENCY DEPARTMENT AT [facility] Provider Note   CSN: [id number] Arrival date & time: 10/16/23  2250     History  Chief Complaint  Patient presents with   Emesis    Karla Hoover is a 53 y.o. female.  The history is provided by the patient.  Patient with extensive history including previous PE, chronic kidney disease, COPD with chronic respiratory failure, diabetes, previous CVA with left-sided hemiparesis presents with chills, headache and concerns she is septic. Patient reports several hours ago she started feeling cold throughout her body and having chills and shakes.  She also reports headaches and abdominal pain.  She reports this feels similar to prior episodes when she has been admitted for sepsis    Home Medications Prior to Admission medications   Medication Sig Start Date End Date Taking? Authorizing Provider  acetaminophen  (TYLENOL ) 500 MG tablet Take 1,000 mg by mouth in the morning, at noon, and at bedtime.    [provider]  albuterol  (VENTOLIN  HFA) 108 (90 Base) MCG/ACT inhaler Inhale 2 puffs into the lungs every 6 (six) hours as needed for wheezing or shortness of breath. 10/04/19   Celestia Rosaline SQUIBB, NP  apixaban  (ELIQUIS ) 5 MG TABS tablet Take 1 tablet (5mg ) twice daily Patient taking differently: Take 5 mg by mouth 2 (two) times daily. 09/10/22   Cindy Garnette POUR, MD  aspirin  81 MG chewable tablet Chew 81 mg by mouth in the morning.    [provider]  atenolol  (TENORMIN ) 25 MG tablet Take 50 mg by mouth 2 (two) times daily.    [provider]  atorvastatin  (LIPITOR ) 80 MG tablet Take 1 tablet (80 mg total) by mouth daily. Patient taking differently: Take 80 mg by mouth at bedtime. 10/04/19   Celestia Rosaline SQUIBB, NP  B Complex-Biotin-FA TABS Take 1 tablet by mouth in the morning.    [provider]  chlorhexidine  (PERIDEX ) 0.12 % solution Use as directed 15 mLs in the mouth or throat every morning.  Rinse and spit after oral care. Do not swallow.    [provider]  clonazePAM  (KLONOPIN ) 0.5 MG tablet Take 1 tablet (0.5 mg total) by mouth 2 (two) times daily. 09/10/23   Vernon Ranks, MD  cyclobenzaprine  (FLEXERIL ) 10 MG tablet Take 1 tablet (10 mg total) by mouth 3 (three) times daily as needed for muscle spasms. Patient taking differently: Take 10 mg by mouth in the morning and at bedtime. 10/04/19   Celestia Rosaline SQUIBB, NP  Dulaglutide  (TRULICITY ) 0.75 MG/0.5ML SOPN Inject 0.75 mg into the skin every Friday.    [provider]  EPINEPHrinesnap 1 MG/ML KIT Inject 1 mg as directed as needed (allergic reaction).    [provider]  ergocalciferol  (VITAMIN D2) 1.25 MG (50000 UT) capsule Take 50,000 Units by mouth every Thursday.    [provider]  ezetimibe  (ZETIA ) 10 MG tablet Take 10 mg by mouth at bedtime.    [provider]  FLUoxetine  (PROZAC ) 20 MG capsule Take 20 mg by mouth daily.    [provider]  gabapentin  (NEURONTIN ) 400 MG capsule Take 1 capsule (400 mg total) by mouth 3 (three) times daily. 09/10/22   Cindy Garnette POUR, MD  hydrALAZINE  (APRESOLINE ) 10 MG tablet Take 10 mg by mouth every 12 (twelve) hours as needed (BP>160 or dBP >95).    [provider]  insulin  glargine (LANTUS ) 100 UNIT/ML injection Inject 0.1 mLs (10 Units total) into the skin 2 (two)  times daily. (discard vial 28 days after first use) Patient taking differently: Inject 38 Units into the skin 2 (two) times daily. 12/18/22   Barbarann Nest, MD  insulin  lispro (HUMALOG ) 100 UNIT/ML injection Inject 0-14 Units into the skin See admin instructions. Inject 0-14 units twice daily as per sliding scale : CBG 0-200 : 0 units CBG 201-250 : 2 units CBG 251-300 : 4 units CBG 301-350 : 6 units CBG 351-400 : 8 units CBG 401-450 : 10 units CBG 451-500 : 12 units (if BP reads high, inject 14 units)    [provider]  LACTOBACILLUS PO Take 1 capsule by  mouth daily.    [provider]  lansoprazole (PREVACID) 30 MG capsule Take 30 mg by mouth every morning.    [provider]  levothyroxine  (SYNTHROID ) 88 MCG tablet Take 88 mcg by mouth daily before breakfast.    [provider]  losartan  (COZAAR ) 25 MG tablet Take 37.5 mg by mouth daily.    [provider]  magnesium  oxide (MAG-OX) 400 (240 Mg) MG tablet Take 400 mg by mouth every other day.    [provider]  Melatonin 5 MG TBDP Take 5 mg by mouth at bedtime.    [provider]  Menthol , Topical Analgesic, 5 % GEL Apply 1 application  topically in the morning. To neck    [provider]  Omega-3 Fatty Acids (FISH OIL) 1000 MG CAPS Take 1,000 mg by mouth 3 (three) times daily.    [provider]  ondansetron  (ZOFRAN ) 4 MG tablet Take 4 mg by mouth 2 (two) times daily.    [provider]  OXYGEN  Inhale 2 L/min into the lungs as needed (for shortness of breath).    [provider]  Propyl Glycol-Hydroxyethylcell (NASAL MOIST) GEL Place 1 application  into both nostrils at bedtime.    [provider]  QUEtiapine  (SEROQUEL ) 25 MG tablet Take 25 mg by mouth at bedtime.    [provider]  rOPINIRole  (REQUIP ) 0.25 MG tablet Take 0.25 mg by mouth at bedtime.    [provider]  Skin Protectants, Misc. (EUCERIN) cream Apply 1 Application topically daily as needed for dry skin.    [provider]  SYMBICORT  80-4.5 MCG/ACT inhaler Inhale 2 puffs into the lungs 2 (two) times daily.    [provider]  Tiotropium Bromide  Monohydrate (SPIRIVA  RESPIMAT) 2.5 MCG/ACT AERS Inhale 2 puffs into the lungs in the morning.    [provider]  witch hazel-glycerin (TUCKS) pad Apply 1 Application topically every 4 (four) hours as needed for hemorrhoids.    [provider]      Allergies    Cipro  [ciprofloxacin  hcl], Guaifenesin , Kiwi extract, Levaquin [levofloxacin],  Strawberry extract, and Lioresal [baclofen]    Review of Systems   Review of Systems  Constitutional:  Positive for chills.  Respiratory:  Negative for cough.   Musculoskeletal:  Positive for myalgias.    Physical Exam Updated Vital Signs BP 95/74   Pulse 83   Temp (!) 101.3 F (38.5 C) (Rectal)   Resp 19   LMP 10/20/2019   SpO2 91%  Physical Exam CONSTITUTIONAL: Chronically ill-appearing HEAD: Normocephalic/atraumatic EYES: EOMI/PERRL ENMT: Mucous membranes moist NECK: supple no meningeal signs CV: S1/S2 noted, no murmurs/rubs/gallops noted LUNGS: Lungs are clear to auscultation bilaterally, no apparent distress ABDOMEN: soft, mild upper abdominal tenderness, no rebound or guarding, bowel sounds noted throughout abdomen NEURO: Pt is awake/alert, chronic left-sided hemiparesis noted EXTREMITIES:  pulses normal/equal, full ROM, scattered rash to her lower extremities, but no evidence of abscess or cellulitis SKIN: warm, color normal   ED Results / Procedures / Treatments   Labs (all labs ordered are listed, but only abnormal results are displayed) Labs Reviewed  COMPREHENSIVE METABOLIC PANEL - Abnormal; Notable for the following components:      Result Value   Sodium 130 (*)    Chloride 96 (*)    Glucose, Bld 271 (*)    BUN 25 (*)    Creatinine, Ser 1.44 (*)    GFR, Estimated 44 (*)    All other components within normal limits  CBC - Abnormal; Notable for the following components:   WBC 17.0 (*)    All other components within normal limits  PROTIME-INR - Abnormal; Notable for the following components:   Prothrombin Time 18.9 (*)    INR 1.6 (*)    All other components within normal limits  APTT - Abnormal; Notable for the following components:   aPTT 38 (*)    All other components within normal limits  URINALYSIS, W/ REFLEX TO CULTURE (INFECTION SUSPECTED) - Abnormal; Notable for the following components:   Color, Urine AMBER (*)    APPearance CLOUDY (*)     Glucose, UA 50 (*)    Hgb urine dipstick LARGE (*)    Protein, ur >=300 (*)    Bacteria, UA MANY (*)    All other components within normal limits  I-STAT CG4 LACTIC ACID, ED - Abnormal; Notable for the following components:   Lactic Acid, Venous 2.2 (*)    All other components within normal limits  I-STAT CG4 LACTIC ACID, ED - Abnormal; Notable for the following components:   Lactic Acid, Venous 2.2 (*)    All other components within normal limits  RESP PANEL BY RT-PCR (RSV, FLU A&B, COVID)  [id number]  CULTURE, BLOOD (ROUTINE X 2)  CULTURE, BLOOD (ROUTINE X 2)  URINE CULTURE  LIPASE, BLOOD    EKG EKG Interpretation Date/Time:  Friday October 17 2023 02:52:44 EST Ventricular Rate:  81 PR Interval:  180 QRS Duration:  96 QT Interval:  419 QTC Calculation: 487 R Axis:   63  Text Interpretation: Sinus rhythm Low voltage, precordial leads Borderline prolonged QT interval Confirmed by Midge Golas ([id number]) on 10/17/2023 4:41:42 AM  Radiology CT Renal Stone Study Result Date: 10/17/2023 CLINICAL DATA:  Abdominal/flank pain.  Stone suspected. EXAM: CT ABDOMEN AND PELVIS WITHOUT CONTRAST TECHNIQUE: Multidetector CT imaging of the abdomen and pelvis was performed following the standard protocol without IV contrast. RADIATION DOSE REDUCTION: This exam was performed according to the departmental dose-optimization program which includes automated exposure control, adjustment of the mA and/or kV according to patient size and/or use of iterative reconstruction technique. COMPARISON:  Renal ultrasound 09/07/2023, CT abdomen pelvis without contrast 04/08/2023 and 09/07/2022. FINDINGS: Lower chest: There is mild bronchial thickening in the lower lobes without infiltrates. Scattered linear scar-like opacities. The cardiac size is normal. Hepatobiliary: The liver is 21 cm length and mildly steatotic. Without contrast is otherwise unremarkable. The gallbladder and bile ducts are unremarkable. Pancreas:  Partially atrophic otherwise unremarkable without contrast. Spleen: Unremarkable without contrast.  No splenomegaly. Adrenals/Urinary Tract: There is no adrenal mass. Right kidney is small and chronically atrophic. Again noted is a Bosniak 1 cyst in the superior pole of the right kidney measuring 1.3 cm, Hounsfield density is 15.2. This is unchanged requiring no follow-up. No intrarenal stones are seen either side. There is no  hydronephrosis or ureteral stones no contour deforming abnormality of the unenhanced kidneys. The bladder is unremarkable for the degree of distention. Stomach/Bowel: No dilatation or wall thickening including the appendix. There is sigmoid diverticulosis without focal diverticulitis. Vascular/Lymphatic: Aortic atherosclerosis. No enlarged abdominal or pelvic lymph nodes. Reproductive: Uterus and bilateral adnexa are unremarkable. Other: Small umbilical and bilateral inguinal fat hernias. No incarcerated hernia. No free air or free fluid. Musculoskeletal: No acute or significant osseous findings. IMPRESSION: 1. No acute noncontrast CT findings in the abdomen or pelvis. 2. No nephrolithiasis or obstructive uropathy. 3. Chronically atrophic right kidney. 4. Mildly enlarged steatotic liver. 5. Sigmoid diverticulosis without evidence of diverticulitis. 6. Aortic atherosclerosis. 7. Mild bronchial thickening in the lower lobes. Aortic Atherosclerosis (ICD10-I70.0). Electronically Signed   By: Francis Quam M.D.   On: 10/17/2023 03:12   DG Chest Port 1 View Result Date: 10/17/2023 CLINICAL DATA:  Nausea, vomiting, chills EXAM: PORTABLE CHEST 1 VIEW COMPARISON:  09/08/2023 FINDINGS: Stable cardiomediastinal silhouette. No focal consolidation, pleural effusion, or pneumothorax. No displaced rib fractures. IMPRESSION: No active disease. Electronically Signed   By: Norman Gatlin M.D.   On: 10/17/2023 00:24    Procedures .Critical Care  Performed by: Midge Golas, MD Authorized by:  Midge Golas, MD   Critical care provider statement:    Critical care time (minutes):  60   Critical care start time:  10/17/2023 1:50 AM   Critical care end time:  10/17/2023 2:50 AM   Critical care time was exclusive of:  Separately billable procedures and treating other patients   Critical care was necessary to treat or prevent imminent or life-threatening deterioration of the following conditions:  Sepsis, metabolic crisis and dehydration   Critical care was time spent personally by me on the following activities:  Examination of patient, development of treatment plan with patient or surrogate, re-evaluation of patient's condition, pulse oximetry, ordering and review of laboratory studies, ordering and review of radiographic studies, ordering and performing treatments and interventions, review of old charts and evaluation of patient's response to treatment   I assumed direction of critical care for this patient from another provider in my specialty: no     Care discussed with: admitting provider       Medications Ordered in ED Medications  lactated ringers  infusion ( Intravenous New Bag/Given 10/17/23 0157)  lactated ringers  bolus 1,000 mL (1,000 mLs Intravenous New Bag/Given 10/17/23 0152)    And  lactated ringers  bolus 1,000 mL (1,000 mLs Intravenous New Bag/Given 10/17/23 0301)    And  lactated ringers  bolus 1,000 mL (1,000 mLs Intravenous New Bag/Given 10/17/23 0304)  cefTRIAXone  (ROCEPHIN ) 2 g in sodium chloride  0.9 % 100 mL IVPB (0 g Intravenous Stopped 10/17/23 0305)  acetaminophen  (TYLENOL ) tablet 650 mg (650 mg Oral Given 10/17/23 0148)    ED Course/ Medical Decision Making/ A&P Clinical Course as of 10/17/23 0441  Fri Oct 17, 2023  0022 I called her nursing facility, they report she had abrupt onset of chills and shaking and reported that she felt she might be septic again. Sepsis workup has been initiated [DW]  0154 Patient febrile, tachycardic.  Code sepsis has been called  IV fluids and antibiotics have been ordered.  Patient reports continued abdominal pain, will obtain CT imaging [DW]  0225 Glucose(!): 271 Hyperglycemia [DW]  0225 Creatinine(!): 1.44 Acute kidney injury [DW]  0439 Overall patient reports feeling improved. Will treat for urosepsis. Discussed with Dr. Alfornia for admission [DW]    Clinical Course User Index [  DW] Midge Golas, MD                                 Medical Decision Making Amount and/or Complexity of Data Reviewed Labs: ordered. Decision-making details documented in ED Course. Radiology: ordered.  Risk OTC drugs. Prescription drug management. Decision regarding hospitalization.   This patient presents to the ED for concern of weakness/chills, this involves an extensive number of treatment options, and is a complaint that carries with it a high risk of complications and morbidity.  The differential diagnosis includes but is not limited to CVA, intracranial hemorrhage, acute coronary syndrome, renal failure, urinary tract infection, electrolyte disturbance, pneumonia Sepsis  Comorbidities that complicate the patient evaluation: Patient's presentation is complicated by their history of CVA  Social Determinants of Health: Patient's  poor mobility and nursing home placement   increases the complexity of managing their presentation  Additional history obtained: Additional history obtained from nursing home/care facility Records reviewed previous admission documents  Lab Tests: I Ordered, and personally interpreted labs.  The pertinent results include: Leukocytosis, UTI  Imaging Studies ordered: I ordered imaging studies including X-ray chest   I independently visualized and interpreted imaging which showed no acute findings I agree with the radiologist interpretation  Cardiac Monitoring: The patient was maintained on a cardiac monitor.  I personally viewed and interpreted the cardiac monitor which showed an  underlying rhythm of:  sinus tachycardia  Medicines ordered and prescription drug management: I ordered medication including IV fluids and antibiotics for presumed sepsis Reevaluation of the patient after these medicines showed that the patient    improved   Critical Interventions:   IV fluids and antibiotics  Consultations Obtained: I requested consultation with the admitting physician [facility] , and discussed  findings as well as pertinent plan - they recommend: Admit  Reevaluation: After the interventions noted above, I reevaluated the patient and found that they have :improved  Complexity of problems addressed: Patient's presentation is most consistent with  acute presentation with potential threat to life or bodily function  Disposition: After consideration of the diagnostic results and the patient's response to treatment,  I feel that the patent would benefit from admission   .           Final Clinical Impression(s) / ED Diagnoses Final diagnoses:  AKI (acute kidney injury) (HCC)  Acute cystitis with hematuria    Rx / DC Orders ED Discharge Orders     None         Midge Golas, MD 10/17/23 [phone]

## 2023-10-17 NOTE — H&P (Signed)
 History and Physical    Patient: Karla Hoover FMW:[id number] DOB: 10-18-1970 DOA: 10/16/2023 DOS: the patient was seen and examined on 10/17/2023 PCP: Pcp, No  Patient coming from: Home  Chief Complaint:  Chief Complaint  Patient presents with   Emesis   HPI: Karla Hoover is a 53 y.o. female with medical history significant of asthma, seasonal allergies, stage 3a CKD, bipolar disorder, class I obesity, tobacco abuse, COPD with chronic bronchitis and chronic respiratory failure with hypoxia, type II DM, hypertension, GERD, grade 1 diastolic dysfunction, hypothyroidism, hyperlipidemia, bilateral pulmonary embolism, DVT, history of other non-hemorrhagic stroke with residual left-sided hemiparesis who presented to the emergency department complaints of having a very large volume emesis associated with nausea, fever, headache, body aches, fatigue, malaise, dysuria, suprapubic and bilateral flank pain.  She was discharged earlier last month due to urinary tract infection with urine culture growing ESBL E. coli and Klebsiella that was only resistant to ampicillin/nitrofurantoin .  She denied rhinorrhea, sore throat, wheezing or hemoptysis.  No chest pain, palpitations, diaphoresis, PND, orthopnea or pitting edema of the lower extremities.  No melena or hematochezia.  No flank pain, dysuria, frequency or hematuria.  No polyuria, polydipsia, polyphagia or blurred vision.   Lab work: Urinalysis was amber and cloudy in appearance.  Glucose was 50 and protein greater than 300 mg/dL.  There was a large hemoglobin.  Negative nitrites and leukocyte esterase.  Greater than 50 RBC, 21-50 WBC, many bacteria on microscopic examination.  CBC showed a white count of 17.0, hemoglobin 13.2 g/dL and platelets 767.  PT 18.9, INR 1.6 and PTT 38.  Lipase was normal.  CMP showed normal electrolytes after sodium correction, glucose 271, BUN 25 creatinine 1.44 mg/dL (creatinine baseline higher than baseline, but does not  meet criteria for AKI).  LFTs were normal.  Lactic acid is 2.2 twice.  Imaging: Portable 1 view chest radiograph with no active disease.  CT renal stone with no acute contra CT findings in the abdomen or pelvis.  No nephrolithiasis or obstructive uropathy.  Chronically atrophic right kidney.  Mildly enlarged steatotic liver.  Seemly diverticulosis without evidence of diverticulitis.  Aortic atherosclerosis.  Mild bronchial thickening in the lower lobes.   ED course: Initial vital signs were temperature 99.3 F, pulse 100, respiration 18, BP 134/80 mmHg O2 sat 94% on nasal cannula oxygen  at 2 LPM.  The patient received acetaminophen  650 mg p.o., ceftriaxone  2 g IVPB and 3000 mL of LR bolus followed by LR at 150 mL/h.  Review of Systems: As mentioned in the history of present illness. All other systems reviewed and are negative. Past Medical History:  Diagnosis Date   Asthma    Bilateral pulmonary embolism (HCC) 11/15/2019   Bipolar disorder (HCC)    Chronic kidney disease, stage 3a (HCC) 02/09/2021   Chronic respiratory failure with hypoxia (HCC) 02/09/2021   COPD (chronic obstructive pulmonary disease) (HCC)    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   Former smoker 36 pack year smoking history     Diabetes mellitus without complication (HCC)    Essential hypertension 05/04/2009   Qualifier: Diagnosis of  By: Lang CMA, Jessica     GERD without esophagitis 02/09/2021   Grade I diastolic dysfunction 12/02/2021   Hypertension    Hypothyroidism 02/09/2021   Mixed 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 (HCC) 02/09/2021   Pulmonary embolism (HCC) 11/16/2019   Stroke (HCC)    Type 2 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 disease,  with long-term current use of insulin  (HCC) 02/09/2021   No past surgical history on file. Social History:  reports that she quit smoking about 3 years ago. Her smoking use included cigarettes. She started smoking about 40  years ago. She has a 18.1 pack-year smoking history. She has never used smokeless tobacco. She reports that she does not drink alcohol and does not use drugs.  Allergies  Allergen Reactions   Cipro  [Ciprofloxacin  Hcl] Hives   Guaifenesin  Anaphylaxis   Kiwi Extract Anaphylaxis and Rash   Levaquin [Levofloxacin] Shortness Of Breath and Rash   Strawberry Extract Anaphylaxis and Rash   Lioresal [Baclofen] Other (See Comments)    Near syncope/ fall    Family History  Problem Relation Age of Onset   Diabetes Mother    COPD Sister    Heart failure Sister    Breast cancer Sister     Prior to Admission medications   Medication Sig Start Date End Date Taking? Authorizing Provider  acetaminophen  (TYLENOL ) 500 MG tablet Take 1,000 mg by mouth in the morning, at noon, and at bedtime.    [provider]  albuterol  (VENTOLIN  HFA) 108 (90 Base) MCG/ACT inhaler Inhale 2 puffs into the lungs every 6 (six) hours as needed for wheezing or shortness of breath. 10/04/19   Celestia Rosaline SQUIBB, NP  apixaban  (ELIQUIS ) 5 MG TABS tablet Take 1 tablet (5mg ) twice daily Patient taking differently: Take 5 mg by mouth 2 (two) times daily. 09/10/22   Cindy Garnette POUR, MD  aspirin  81 MG chewable tablet Chew 81 mg by mouth in the morning.    [provider]  atenolol  (TENORMIN ) 25 MG tablet Take 50 mg by mouth 2 (two) times daily.    [provider]  atorvastatin  (LIPITOR ) 80 MG tablet Take 1 tablet (80 mg total) by mouth daily. Patient taking differently: Take 80 mg by mouth at bedtime. 10/04/19   Celestia Rosaline SQUIBB, NP  B Complex-Biotin-FA TABS Take 1 tablet by mouth in the morning.    [provider]  chlorhexidine  (PERIDEX ) 0.12 % solution Use as directed 15 mLs in the mouth or throat every morning. Rinse and spit after oral care. Do not swallow.    [provider]  clonazePAM  (KLONOPIN ) 0.5 MG tablet Take 1 tablet (0.5 mg total) by mouth 2 (two) times daily. 09/10/23    Vernon Ranks, MD  cyclobenzaprine  (FLEXERIL ) 10 MG tablet Take 1 tablet (10 mg total) by mouth 3 (three) times daily as needed for muscle spasms. Patient taking differently: Take 10 mg by mouth in the morning and at bedtime. 10/04/19   Celestia Rosaline SQUIBB, NP  Dulaglutide  (TRULICITY ) 0.75 MG/0.5ML SOPN Inject 0.75 mg into the skin every Friday.    [provider]  EPINEPHrinesnap 1 MG/ML KIT Inject 1 mg as directed as needed (allergic reaction).    [provider]  ergocalciferol  (VITAMIN D2) 1.25 MG (50000 UT) capsule Take 50,000 Units by mouth every Thursday.    [provider]  ezetimibe  (ZETIA ) 10 MG tablet Take 10 mg by mouth at bedtime.    [provider]  FLUoxetine  (PROZAC ) 20 MG capsule Take 20 mg by mouth daily.    [provider]  gabapentin  (NEURONTIN ) 400 MG capsule Take 1 capsule (400 mg total) by mouth 3 (three) times daily. 09/10/22   Cindy Garnette POUR, MD  hydrALAZINE  (APRESOLINE ) 10 MG tablet Take 10 mg by mouth every 12 (twelve) hours as needed (BP>160 or dBP >95).  [provider]  insulin  glargine (LANTUS ) 100 UNIT/ML injection Inject 0.1 mLs (10 Units total) into the skin 2 (two) times daily. (discard vial 28 days after first use) Patient taking differently: Inject 38 Units into the skin 2 (two) times daily. 12/18/22   Barbarann Nest, MD  insulin  lispro (HUMALOG ) 100 UNIT/ML injection Inject 0-14 Units into the skin See admin instructions. Inject 0-14 units twice daily as per sliding scale : CBG 0-200 : 0 units CBG 201-250 : 2 units CBG 251-300 : 4 units CBG 301-350 : 6 units CBG 351-400 : 8 units CBG 401-450 : 10 units CBG 451-500 : 12 units (if BP reads high, inject 14 units)    [provider]  LACTOBACILLUS PO Take 1 capsule by mouth daily.    [provider]  lansoprazole (PREVACID) 30 MG capsule Take 30 mg by mouth every morning.    [provider]  levothyroxine  (SYNTHROID ) 88 MCG  tablet Take 88 mcg by mouth daily before breakfast.    [provider]  losartan  (COZAAR ) 25 MG tablet Take 37.5 mg by mouth daily.    [provider]  magnesium  oxide (MAG-OX) 400 (240 Mg) MG tablet Take 400 mg by mouth every other day.    [provider]  Melatonin 5 MG TBDP Take 5 mg by mouth at bedtime.    [provider]  Menthol , Topical Analgesic, 5 % GEL Apply 1 application  topically in the morning. To neck    [provider]  Omega-3 Fatty Acids (FISH OIL) 1000 MG CAPS Take 1,000 mg by mouth 3 (three) times daily.    [provider]  ondansetron  (ZOFRAN ) 4 MG tablet Take 4 mg by mouth 2 (two) times daily.    [provider]  OXYGEN  Inhale 2 L/min into the lungs as needed (for shortness of breath).    [provider]  Propyl Glycol-Hydroxyethylcell (NASAL MOIST) GEL Place 1 application  into both nostrils at bedtime.    [provider]  QUEtiapine  (SEROQUEL ) 25 MG tablet Take 25 mg by mouth at bedtime.    [provider]  rOPINIRole  (REQUIP ) 0.25 MG tablet Take 0.25 mg by mouth at bedtime.    [provider]  Skin Protectants, Misc. (EUCERIN) cream Apply 1 Application topically daily as needed for dry skin.    [provider]  SYMBICORT  80-4.5 MCG/ACT inhaler Inhale 2 puffs into the lungs 2 (two) times daily.    [provider]  Tiotropium Bromide  Monohydrate (SPIRIVA  RESPIMAT) 2.5 MCG/ACT AERS Inhale 2 puffs into the lungs in the morning.    [provider]  witch hazel-glycerin (TUCKS) pad Apply 1 Application topically every 4 (four) hours as needed for hemorrhoids.    [provider]    Physical Exam: Vitals:   10/17/23 0235 10/17/23 0330 10/17/23 0605 10/17/23 0740  BP: 95/74 (!) 97/55 (!) 176/90 (!) 188/111  Pulse: 83 78 88 (!) 101  Resp: 19 19 (!) 22 (!) 22  Temp:   98.6 F (37 C)   TempSrc:      SpO2: 91% 98% 95% 98%   Physical Exam Vitals  and nursing note reviewed.  Constitutional:      General: She is awake. She is not in acute distress.    Appearance: Normal appearance. She is obese. She is ill-appearing.     Interventions: Nasal cannula in place.  HENT:     Head: Normocephalic.     Nose: No rhinorrhea.  Mouth/Throat:     Mouth: Mucous membranes are dry.  Eyes:     General: No scleral icterus.    Pupils: Pupils are equal, round, and reactive to light.  Neck:     Vascular: No JVD.  Cardiovascular:     Rate and Rhythm: Regular rhythm. Tachycardia present.     Heart sounds: S1 normal and S2 normal.  Pulmonary:     Effort: Pulmonary effort is normal.     Breath sounds: Normal breath sounds. No wheezing, rhonchi or rales.  Abdominal:     General: Bowel sounds are normal. There is no distension.     Palpations: Abdomen is soft.     Tenderness: There is abdominal tenderness. There is no right CVA tenderness, left CVA tenderness, guarding or rebound.  Musculoskeletal:     Cervical back: Neck supple.     Right lower leg: No edema.     Left lower leg: No edema.  Skin:    General: Skin is warm and dry.  Neurological:     General: No focal deficit present.     Mental Status: She is alert and oriented to person, place, and time.  Psychiatric:        Mood and Affect: Mood normal.        Behavior: Behavior normal. Behavior is cooperative.     Data Reviewed:  Results are pending, will review when available. 09/08/2023 echocardiogram complete. IMPRESSIONS:   1. Left ventricular ejection fraction, by estimation, is 60 to 65%. The  left ventricle has normal function. The left ventricle has no regional  wall motion abnormalities. There is mild left ventricular hypertrophy.  Left ventricular diastolic parameters  are consistent with Grade I diastolic dysfunction (impaired relaxation).   2. Right ventricular systolic function is normal. The right ventricular  size is normal.   3. The mitral valve is normal in  structure. No evidence of mitral valve  regurgitation.   4. The aortic valve is tricuspid. Aortic valve regurgitation is not  visualized.    EKG: Vent. rate 81 BPM PR interval 180 ms QRS duration 96 ms QT/QTcB 419/487 ms P-R-T axes 58 63 71 Sinus rhythm Low voltage, precordial leads Borderline prolonged QT interval  Assessment and Plan: Principal Problem:   Sepsis secondary to UTI (HCC) Admit to telemetry/inpatient. Continue IV fluids. Received ceftriaxone  2 g IVPB earlier. -However, have recent ESBL E. coli on UC&S. -Meropenem  for pharmacy requested. Follow-up urine culture and sensitivity. Follow-up blood culture and sensitivity Follow CBC and CMP in a.m.  Active Problems:   Type 2 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 disease,   with long-term current use of insulin  (HCC) Carbohydrate modified diet. Semglee  10 units SQ daily. CBG monitoring with RI SS. Hemoglobin A1c 10.5% about 6 weeks ago. Check hemoglobin A1c in 2-3 months. Creatinine slightly higher than normal. -Holding losartan  for now. -Follow-up renal function and electrolytes in AM. Continue gabapentin  400 mg p.o. 3 times daily for peripheral neuropathy.    Chronic respiratory failure with hypoxia (HCC) Secondary to:   COPD with chronic bronchitis (HCC) Continue home O2 at usual rate.    Essential hypertension Hold losartan . Resume atenolol  50 mg p.o. twice daily.    Hemiparesis affecting left side as l ate effect of cerebrovascular accident (CVA) (HCC) Supportive care. Continue DOAC, statin and blood pressure control.    hx of PE/DVT On apixaban  5 mg p.o. twice daily..    Grade I diastolic dysfunction Continue beta-blocker. Hold losartan  and monitor renal function.  Resume losartan  once creatinine around baseline.    GERD without esophagitis Continue lansoprazole 30 mg p.o. daily or formulary equivalent.    Hypothyroidism Continue levothyroxine  88 mcg p.o. daily.    Class 1  obesity due to excess calories  with body mass index (BMI) of 32.0 to 32.9 in adult Current weight and BMI still pending.   Lifestyle modifications. Follow-up with closely PCP and/or bariatric clinic.    Dyslipidemia Continue atorvastatin  80 mg p.o. daily.    Anxiety   Depression Continue fluoxetine  20 mg p.o. daily.    Prolonged QT interval (borderline prolonged). Avoid QT prolonging meds as possible. Continue mag oxide 3 times weekly. Keep electrolytes optimized. Check EKG in the morning.    Advance Care Planning:   Code Status: Full Code   Consults:   Family Communication: Her mother Sharlet was at bedside.  Severity of Illness: The appropriate patient status for this patient is INPATIENT. Inpatient status is judged to be reasonable and necessary in order to provide the required intensity of service to ensure the patient's safety. The patient's presenting symptoms, physical exam findings, and initial radiographic and laboratory data in the context of their chronic comorbidities is felt to place them at high risk for further clinical deterioration. Furthermore, it is not anticipated that the patient will be medically stable for discharge from the hospital within 2 midnights of admission.   * I certify that at the point of admission it is my clinical judgment that the patient will require inpatient hospital care spanning beyond 2 midnights from the point of admission due to high intensity of service, high risk for further deterioration and high frequency of surveillance required.*  Author: Alm Dorn Castor, MD 10/17/2023 7:45 AM  For on call review [email or web address].   This document was prepared using Dragon voice recognition software and may contain some unintended transcription errors.

## 2023-10-17 NOTE — ED Notes (Signed)
 Pt attempted to use restroom but could not. Pt is currently getting fluids will try again once they are done.

## 2023-10-17 NOTE — ED Notes (Signed)
 Pt. I-stat CG4 Lactic Acid results 2.2, Wickline,MD. made aware.

## 2023-10-17 NOTE — Progress Notes (Signed)
 Elink monitoring for the code sepsis protocol.

## 2023-10-18 DIAGNOSIS — A419 Sepsis, unspecified organism: Secondary | ICD-10-CM | POA: Diagnosis not present

## 2023-10-18 DIAGNOSIS — N390 Urinary tract infection, site not specified: Secondary | ICD-10-CM | POA: Diagnosis not present

## 2023-10-18 LAB — BLOOD CULTURE ID PANEL (REFLEXED) - BCID2

## 2023-10-18 LAB — CBC WITH DIFFERENTIAL/PLATELET
Abs Immature Granulocytes: 0.04 10*3/uL (ref 0.00–0.07)
Basophils Absolute: 0 10*3/uL (ref 0.0–0.1)
Basophils Relative: 0 %
Eosinophils Absolute: 0.2 10*3/uL (ref 0.0–0.5)
Eosinophils Relative: 2 %
HCT: 34.6 % — ABNORMAL LOW (ref 36.0–46.0)
Hemoglobin: 10.7 g/dL — ABNORMAL LOW (ref 12.0–15.0)
Immature Granulocytes: 0 %
Lymphocytes Relative: 27 %
Lymphs Abs: 2.7 10*3/uL (ref 0.7–4.0)
MCH: 27.4 pg (ref 26.0–34.0)
MCHC: 30.9 g/dL (ref 30.0–36.0)
MCV: 88.7 fL (ref 80.0–100.0)
Monocytes Absolute: 1 10*3/uL (ref 0.1–1.0)
Monocytes Relative: 10 %
Neutro Abs: 6.2 10*3/uL (ref 1.7–7.7)
Neutrophils Relative %: 61 %
Platelets: 166 10*3/uL (ref 150–400)
RBC: 3.9 MIL/uL (ref 3.87–5.11)
RDW: 14.2 % (ref 11.5–15.5)
WBC: 10.1 10*3/uL (ref 4.0–10.5)
nRBC: 0 % (ref 0.0–0.2)

## 2023-10-18 LAB — GLUCOSE, CAPILLARY
Glucose-Capillary: 165 mg/dL — ABNORMAL HIGH (ref 70–99)
Glucose-Capillary: 176 mg/dL — ABNORMAL HIGH (ref 70–99)
Glucose-Capillary: 133 mg/dL — ABNORMAL HIGH (ref 70–99)
Glucose-Capillary: 165 mg/dL — ABNORMAL HIGH (ref 70–99)

## 2023-10-18 LAB — TYPE AND SCREEN
ABO/RH(D): A NEG
Antibody Screen: NEGATIVE

## 2023-10-18 LAB — COMPREHENSIVE METABOLIC PANEL
ALT: 11 U/L (ref 0–44)
AST: 14 U/L — ABNORMAL LOW (ref 15–41)
Albumin: 2.6 g/dL — ABNORMAL LOW (ref 3.5–5.0)
Alkaline Phosphatase: 47 U/L (ref 38–126)
Anion gap: 8 (ref 5–15)
BUN: 21 mg/dL — ABNORMAL HIGH (ref 6–20)
CO2: 28 mmol/L (ref 22–32)
Calcium: 8.7 mg/dL — ABNORMAL LOW (ref 8.9–10.3)
Chloride: 100 mmol/L (ref 98–111)
Creatinine, Ser: 1.41 mg/dL — ABNORMAL HIGH (ref 0.44–1.00)
GFR, Estimated: 45 mL/min — ABNORMAL LOW (ref >60)
Glucose, Bld: 163 mg/dL — ABNORMAL HIGH (ref 70–99)
Potassium: 4.3 mmol/L (ref 3.5–5.1)
Sodium: 136 mmol/L (ref 135–145)
Total Bilirubin: 0.8 mg/dL (ref 0.0–1.2)
Total Protein: 5.9 g/dL — ABNORMAL LOW (ref 6.5–8.1)

## 2023-10-18 LAB — URINE CULTURE: Culture: <10000 — AB

## 2023-10-18 MED ORDER — LACTATED RINGERS IV SOLN: INTRAVENOUS | Status: DC

## 2023-10-18 MED ORDER — APIXABAN 5 MG PO TABS
5.0000 mg | ORAL_TABLET | Freq: Two times a day (BID) | ORAL | Status: DC
  Administered 2023-10-18 – 2023-10-24 (×14): 5 mg via ORAL
  Filled 2023-10-18 (×14): qty 1

## 2023-10-18 NOTE — Plan of Care (Addendum)
 Patient alert and oriented, VSS. Able to transfer 1-2 assistance to [facility] and chair at bedside. Patient endorses neck and back pain. CBGs are ACHS. Contact precaution continued.  Problem: Education: Goal: Ability to describe self-care measures that may prevent or decrease complications (Diabetes Survival Skills Education) will improve Outcome: Progressing Goal: Individualized Educational Video(s) Outcome: Progressing   Problem: Coping: Goal: Ability to adjust to condition or change in health will improve Outcome: Progressing   Problem: Fluid Volume: Goal: Ability to maintain a balanced intake and output will improve Outcome: Progressing   Problem: Health Behavior/Discharge Planning: Goal: Ability to identify and utilize available resources and services will improve Outcome: Progressing Goal: Ability to manage health-related needs will improve Outcome: Progressing   Problem: Metabolic: Goal: Ability to maintain appropriate glucose levels will improve Outcome: Progressing   Problem: Nutritional: Goal: Maintenance of adequate nutrition will improve Outcome: Progressing Goal: Progress toward achieving an optimal weight will improve Outcome: Progressing   Problem: Skin Integrity: Goal: Risk for impaired skin integrity will decrease Outcome: Progressing   Problem: Tissue Perfusion: Goal: Adequacy of tissue perfusion will improve Outcome: Progressing   Problem: Fluid Volume: Goal: Hemodynamic stability will improve Outcome: Progressing   Problem: Clinical Measurements: Goal: Diagnostic test results will improve Outcome: Progressing Goal: Signs and symptoms of infection will decrease Outcome: Progressing   Problem: Respiratory: Goal: Ability to maintain adequate ventilation will improve Outcome: Progressing   Problem: Education: Goal: Knowledge of General Education information will improve Description: Including pain rating scale, medication(s)/side effects and  non-pharmacologic comfort measures Outcome: Progressing   Problem: Health Behavior/Discharge Planning: Goal: Ability to manage health-related needs will improve Outcome: Progressing   Problem: Clinical Measurements: Goal: Ability to maintain clinical measurements within normal limits will improve Outcome: Progressing Goal: Will remain free from infection Outcome: Progressing Goal: Diagnostic test results will improve Outcome: Progressing Goal: Respiratory complications will improve Outcome: Progressing Goal: Cardiovascular complication will be avoided Outcome: Progressing   Problem: Activity: Goal: Risk for activity intolerance will decrease Outcome: Progressing   Problem: Nutrition: Goal: Adequate nutrition will be maintained Outcome: Progressing   Problem: Coping: Goal: Level of anxiety will decrease Outcome: Progressing   Problem: Elimination: Goal: Will not experience complications related to bowel motility Outcome: Progressing Goal: Will not experience complications related to urinary retention Outcome: Progressing   Problem: Pain Management: Goal: General experience of comfort will improve Outcome: Progressing   Problem: Safety: Goal: Ability to remain free from injury will improve Outcome: Progressing   Problem: Skin Integrity: Goal: Risk for impaired skin integrity will decrease Outcome: Progressing

## 2023-10-18 NOTE — Plan of Care (Signed)

## 2023-10-18 NOTE — Plan of Care (Signed)

## 2023-10-18 NOTE — Progress Notes (Signed)
 PROGRESS NOTE    Karla Hoover  FMW:[id number] DOB: Apr 21, 1971 DOA: 10/16/2023 PCP: Pcp, No   Brief Narrative:  53 y.o. female with medical history significant of asthma, seasonal allergies, stage 3a CKD, bipolar disorder, class I obesity, tobacco abuse, COPD with chronic bronchitis and chronic respiratory failure with hypoxia, type II DM, hypertension, GERD, grade 1 diastolic dysfunction, hypothyroidism, hyperlipidemia, bilateral pulmonary embolism, DVT, history of other non-hemorrhagic stroke with residual left-sided hemiparesis and recent admission last month for ESBL E. coli and Klebsiella UTI treated with antibiotics presented with nausea, vomiting, fever, dysuria and suprapubic/flank pain.  On presentation, UA was suggestive of UTI; WBCs of 17, creatinine of 1.44 and lactic acid of 2.2.  Chest x-ray showed no active disease.  CT renal stone study showed no acute findings in the abdomen or pelvis with no nephrolithiasis or obstructive uropathy.  She was started on IV fluids and broad-spectrum antibiotics.  Assessment & Plan:   Sepsis: Present on admission UTI: Present on admission Lactic acidosis: Resolved -Presented with fever, lactic acidosis, leukocytosis with possible UTI -Had ESBL E. coli during last admission.  Continue meropenem .  Follow cultures.  Leukocytosis -Resolved  Diabetes mellitus type 2 with hyperglycemia Peripheral neuropathy -Carb modified diet.  Continue long-acting insulin  along with CBGs with SSI.  Continue gabapentin   Chronic respite failure with hypoxia COPD -Stable.  Continue supplemental oxygen .  Continue current inhaled therapy.  Outpatient follow-up with pulmonary  Essential hypertension--losartan  on hold.  Continue atenolol   History of unspecified CVA with left-sided hemiparesis Hyperlipidemia -Continue supportive care.  Outpatient follow-up with neurology.  Continue ezetimibe /statin.  PT eval  History of PE/DVT -Resume Eliquis   Grade 1  diastolic dysfunction -Continue beta-blocker.  Losartan  on hold for now.  CKD stage IIIa -Creatinine currently stable.  Monitor  Anxiety/depression -Continue fluoxetine   Prolonged QT -Monitor on telemetry.  Avoid QT prolonging meds as much as possible  Obesity -Outpatient follow-up    DVT prophylaxis: Start Eliquis  Code Status: Full Family Communication: None at bedside Disposition Plan: Status is: Inpatient Remains inpatient appropriate because: Of severity of illness    Consultants: None  Procedures: None  Antimicrobials:  Anti-infectives (From admission, onward)    Start     Dose/Rate Route Frequency Ordered Stop   10/18/23 0600  cefTRIAXone  (ROCEPHIN ) 2 g in sodium chloride  0.9 % 100 mL IVPB  Status:  Discontinued        2 g 200 mL/hr over 30 Minutes Intravenous Every 24 hours 10/17/23 0747 10/17/23 0902   10/17/23 0900  meropenem  (MERREM ) 1 g in sodium chloride  0.9 % 100 mL IVPB        1 g 200 mL/hr over 30 Minutes Intravenous Every 8 hours 10/17/23 0858     10/17/23 0145  cefTRIAXone  (ROCEPHIN ) 2 g in sodium chloride  0.9 % 100 mL IVPB        2 g 200 mL/hr over 30 Minutes Intravenous Once 10/17/23 0132 10/17/23 0305      '  Subjective: Patient seen and examined at bedside.  No fever, seizures, agitation or vomiting reported.  Feels slightly better but still feels weak.  Objective: Vitals:   10/17/23 2346 10/18/23 0158 10/18/23 0359 10/18/23 0557  BP: (!) 100/54 91/60 104/63 122/71  Pulse: 75 81 80 86  Resp:   18 19  Temp:   99.3 F (37.4 C) 99.1 F (37.3 C)  TempSrc:   Oral Oral  SpO2:  93% 97% 94%  Weight:      Height:  Intake/Output Summary (Last 24 hours) at 10/18/2023 0803 Last data filed at 10/18/2023 0729 Gross per 24 hour  Intake 4300 ml  Output 500 ml  Net 3800 ml   Filed Weights   10/17/23 1628  Weight: 94 kg    Examination:  General exam: Appears calm and comfortable.  Chronically ill and deconditioned.   Respiratory  system: Bilateral decreased breath sounds at bases with scattered crackles Cardiovascular system: S1 & S2 heard, Rate controlled Gastrointestinal system: Abdomen is obese, nondistended, soft and nontender. Normal bowel sounds heard. Extremities: No cyanosis, clubbing, edema  Central nervous system: Awake, slow to respond, poor historian.  Left-sided weakness present  skin: No rashes, lesions or ulcers Psychiatry: Flat affect.  Not agitated.  Data Reviewed: I have personally reviewed following labs and imaging studies  CBC: Recent Labs  Lab 10/17/23 0107 10/17/23 1948 10/18/23 0521  WBC 17.0*  --  10.1  NEUTROABS  --   --  6.2  HGB 13.2 11.4* 10.7*  HCT 41.1 34.8* 34.6*  MCV 86.3  --  88.7  PLT 232  --  166   Basic Metabolic Panel: Recent Labs  Lab 10/17/23 0107 10/18/23 0521  NA 130* 136  K 5.0 4.3  CL 96* 100  CO2 25 28  GLUCOSE 271* 163*  BUN 25* 21*  CREATININE 1.44* 1.41*  CALCIUM  8.9 8.7*   GFR: Estimated Creatinine Clearance: 56.1 mL/min (A) (by C-G formula based on SCr of 1.41 mg/dL (H)). Liver Function Tests: Recent Labs  Lab 10/17/23 0107 10/18/23 0521  AST 22 14*  ALT 14 11  ALKPHOS 64 47  BILITOT 0.7 0.8  PROT 7.6 5.9*  ALBUMIN 4.0 2.6*   Recent Labs  Lab 10/17/23 0107  LIPASE 22   No results for input(s): AMMONIA in the last 168 hours. Coagulation Profile: Recent Labs  Lab 10/17/23 0107  INR 1.6*   Cardiac Enzymes: No results for input(s): CKTOTAL, CKMB, CKMBINDEX, TROPONINI in the last 168 hours. BNP (last 3 results) No results for input(s): PROBNP in the last 8760 hours. HbA1C: No results for input(s): HGBA1C in the last 72 hours. CBG: Recent Labs  Lab 10/17/23 0753 10/17/23 1148 10/17/23 1808 10/17/23 2129 10/18/23 0727  GLUCAP 206* 176* 232* 150* 165*   Lipid Profile: No results for input(s): CHOL, HDL, LDLCALC, TRIG, CHOLHDL, LDLDIRECT in the last 72 hours. Thyroid Function Tests: No results  for input(s): TSH, T4TOTAL, FREET4, T3FREE, THYROIDAB in the last 72 hours. Anemia Panel: No results for input(s): VITAMINB12, FOLATE, FERRITIN, TIBC, IRON, RETICCTPCT in the last 72 hours. Sepsis Labs: Recent Labs  Lab 10/17/23 0125 10/17/23 0302 10/17/23 2230  LATICACIDVEN 2.2* 2.2* 0.9    Recent Results (from the past 240 hours)  Resp panel by RT-PCR (RSV, Flu A&B, Covid) Anterior Nasal Swab     Status: None   Collection Time: 10/16/23 11:17 PM   Specimen: Anterior Nasal Swab  Result Value Ref Range Status   SARS Coronavirus 2 by RT PCR NEGATIVE NEGATIVE Final    Comment: (NOTE) SARS-CoV-2 target nucleic acids are NOT DETECTED.  The SARS-CoV-2 RNA is generally detectable in upper respiratory specimens during the acute phase of infection. The lowest concentration of SARS-CoV-2 viral copies this assay can detect is 138 copies/mL. A negative result does not preclude SARS-Cov-2 infection and should not be used as the sole basis for treatment or other patient management decisions. A negative result may occur with  improper specimen collection/handling, submission of specimen other than nasopharyngeal swab, presence  of viral mutation(s) within the areas targeted by this assay, and inadequate number of viral copies(<138 copies/mL). A negative result must be combined with clinical observations, patient history, and epidemiological information. The expected result is Negative.  Fact Sheet for Patients:  bloggercourse.com  Fact Sheet for Healthcare Providers:  seriousbroker.it  This test is no t yet approved or cleared by the [place]  FDA and  has been authorized for detection and/or diagnosis of SARS-CoV-2 by FDA under an Emergency Use Authorization (EUA). This EUA will remain  in effect (meaning this test can be used) for the duration of the COVID-19 declaration under Section 564(b)(1) of the Act,  21 U.S.C.section 360bbb-3(b)(1), unless the authorization is terminated  or revoked sooner.       Influenza A by PCR NEGATIVE NEGATIVE Final   Influenza B by PCR NEGATIVE NEGATIVE Final    Comment: (NOTE) The Xpert Xpress SARS-CoV-2/FLU/RSV plus assay is intended as an aid in the diagnosis of influenza from Nasopharyngeal swab specimens and should not be used as a sole basis for treatment. Nasal washings and aspirates are unacceptable for Xpert Xpress SARS-CoV-2/FLU/RSV testing.  Fact Sheet for Patients: bloggercourse.com  Fact Sheet for Healthcare Providers: seriousbroker.it  This test is not yet approved or cleared by the [place]  FDA and has been authorized for detection and/or diagnosis of SARS-CoV-2 by FDA under an Emergency Use Authorization (EUA). This EUA will remain in effect (meaning this test can be used) for the duration of the COVID-19 declaration under Section 564(b)(1) of the Act, 21 U.S.C. section 360bbb-3(b)(1), unless the authorization is terminated or revoked.     Resp Syncytial Virus by PCR NEGATIVE NEGATIVE Final    Comment: (NOTE) Fact Sheet for Patients: bloggercourse.com  Fact Sheet for Healthcare Providers: seriousbroker.it  This test is not yet approved or cleared by the [place]  FDA and has been authorized for detection and/or diagnosis of SARS-CoV-2 by FDA under an Emergency Use Authorization (EUA). This EUA will remain in effect (meaning this test can be used) for the duration of the COVID-19 declaration under Section 564(b)(1) of the Act, 21 U.S.C. section 360bbb-3(b)(1), unless the authorization is terminated or revoked.  Performed at [facility], [address], [place], [place] [address]   Blood Culture (routine x 2)     Status: None (Preliminary result)   Collection Time: 10/17/23  1:18 AM   Specimen: BLOOD  LEFT FOREARM  Result Value Ref Range Status   Specimen Description   Final    BLOOD LEFT FOREARM Performed at [facility], [address], [place], [place] [address]    Special Requests   Final    BOTTLES DRAWN AEROBIC AND ANAEROBIC Blood Culture adequate volume Performed at [facility], [address] [place], [place] [address]    Culture  Setup Time   Final    GRAM POSITIVE COCCI ANAEROBIC BOTTLE ONLY CRITICAL RESULT CALLED TO, READ BACK BY AND VERIFIED WITH: PHARMD E JACKSON 10/18/2023 @ 0003 BY AB Performed at [facility], [address] [place], [place] [address]    Culture GRAM POSITIVE COCCI  Final   Report Status PENDING  Incomplete  Blood Culture ID Panel (Reflexed)     Status: Abnormal   Collection Time: 10/17/23  1:18 AM  Result Value Ref Range Status   Enterococcus faecalis NOT DETECTED NOT DETECTED Final   Enterococcus Faecium NOT DETECTED NOT DETECTED Final   Listeria monocytogenes NOT DETECTED NOT DETECTED Final  Staphylococcus species DETECTED (A) NOT DETECTED Final    Comment: CRITICAL RESULT CALLED TO, READ BACK BY AND VERIFIED WITH: PHARMD E JACKSON 10/18/2023 @ 0003 BY AB    Staphylococcus aureus (BCID) NOT DETECTED NOT DETECTED Final   Staphylococcus epidermidis NOT DETECTED NOT DETECTED Final   Staphylococcus lugdunensis NOT DETECTED NOT DETECTED Final   Streptococcus species NOT DETECTED NOT DETECTED Final   Streptococcus agalactiae NOT DETECTED NOT DETECTED Final   Streptococcus pneumoniae NOT DETECTED NOT DETECTED Final   Streptococcus pyogenes NOT DETECTED NOT DETECTED Final   A.calcoaceticus-baumannii NOT DETECTED NOT DETECTED Final   Bacteroides fragilis NOT DETECTED NOT DETECTED Final   Enterobacterales NOT DETECTED NOT DETECTED Final   Enterobacter cloacae complex NOT DETECTED NOT DETECTED Final   Escherichia coli NOT DETECTED NOT DETECTED Final   Klebsiella aerogenes NOT DETECTED NOT DETECTED Final    Klebsiella oxytoca NOT DETECTED NOT DETECTED Final   Klebsiella pneumoniae NOT DETECTED NOT DETECTED Final   Proteus species NOT DETECTED NOT DETECTED Final   Salmonella species NOT DETECTED NOT DETECTED Final   Serratia marcescens NOT DETECTED NOT DETECTED Final   Haemophilus influenzae NOT DETECTED NOT DETECTED Final   Neisseria meningitidis NOT DETECTED NOT DETECTED Final   Pseudomonas aeruginosa NOT DETECTED NOT DETECTED Final   Stenotrophomonas maltophilia NOT DETECTED NOT DETECTED Final   Candida albicans NOT DETECTED NOT DETECTED Final   Candida auris NOT DETECTED NOT DETECTED Final   Candida glabrata NOT DETECTED NOT DETECTED Final   Candida krusei NOT DETECTED NOT DETECTED Final   Candida parapsilosis NOT DETECTED NOT DETECTED Final   Candida tropicalis NOT DETECTED NOT DETECTED Final   Cryptococcus neoformans/gattii NOT DETECTED NOT DETECTED Final    Comment: Performed at [facility], [address], [place], [place] [address]         Radiology Studies: CT Renal Stone Study Result Date: 10/17/2023 CLINICAL DATA:  Abdominal/flank pain.  Stone suspected. EXAM: CT ABDOMEN AND PELVIS WITHOUT CONTRAST TECHNIQUE: Multidetector CT imaging of the abdomen and pelvis was performed following the standard protocol without IV contrast. RADIATION DOSE REDUCTION: This exam was performed according to the departmental dose-optimization program which includes automated exposure control, adjustment of the mA and/or kV according to patient size and/or use of iterative reconstruction technique. COMPARISON:  Renal ultrasound 09/07/2023, CT abdomen pelvis without contrast 04/08/2023 and 09/07/2022. FINDINGS: Lower chest: There is mild bronchial thickening in the lower lobes without infiltrates. Scattered linear scar-like opacities. The cardiac size is normal. Hepatobiliary: The liver is 21 cm length and mildly steatotic. Without contrast is otherwise unremarkable. The gallbladder and bile ducts  are unremarkable. Pancreas: Partially atrophic otherwise unremarkable without contrast. Spleen: Unremarkable without contrast.  No splenomegaly. Adrenals/Urinary Tract: There is no adrenal mass. Right kidney is small and chronically atrophic. Again noted is a Bosniak 1 cyst in the superior pole of the right kidney measuring 1.3 cm, Hounsfield density is 15.2. This is unchanged requiring no follow-up. No intrarenal stones are seen either side. There is no hydronephrosis or ureteral stones no contour deforming abnormality of the unenhanced kidneys. The bladder is unremarkable for the degree of distention. Stomach/Bowel: No dilatation or wall thickening including the appendix. There is sigmoid diverticulosis without focal diverticulitis. Vascular/Lymphatic: Aortic atherosclerosis. No enlarged abdominal or pelvic lymph nodes. Reproductive: Uterus and bilateral adnexa are unremarkable. Other: Small umbilical and bilateral inguinal fat hernias. No incarcerated hernia. No free air or free fluid. Musculoskeletal: No acute or significant osseous findings. IMPRESSION: 1. No acute  noncontrast CT findings in the abdomen or pelvis. 2. No nephrolithiasis or obstructive uropathy. 3. Chronically atrophic right kidney. 4. Mildly enlarged steatotic liver. 5. Sigmoid diverticulosis without evidence of diverticulitis. 6. Aortic atherosclerosis. 7. Mild bronchial thickening in the lower lobes. Aortic Atherosclerosis (ICD10-I70.0). Electronically Signed   By: Francis Quam M.D.   On: 10/17/2023 03:12   DG Chest Port 1 View Result Date: 10/17/2023 CLINICAL DATA:  Nausea, vomiting, chills EXAM: PORTABLE CHEST 1 VIEW COMPARISON:  09/08/2023 FINDINGS: Stable cardiomediastinal silhouette. No focal consolidation, pleural effusion, or pneumothorax. No displaced rib fractures. IMPRESSION: No active disease. Electronically Signed   By: Norman Gatlin M.D.   On: 10/17/2023 00:24        Scheduled Meds:  atenolol   50 mg Oral BID    atorvastatin   80 mg Oral QHS   ezetimibe   10 mg Oral QHS   FLUoxetine   20 mg Oral Daily   gabapentin   400 mg Oral TID   insulin  aspart  0-15 Units Subcutaneous TID WC   insulin  glargine-yfgn  10 Units Subcutaneous QHS   levothyroxine   88 mcg Oral Q0600   magnesium  oxide  400 mg Oral QODAY   melatonin  5 mg Oral QHS   mometasone -formoterol   2 puff Inhalation BID   pantoprazole   20 mg Oral Daily   QUEtiapine   25 mg Oral QHS   rOPINIRole   0.25 mg Oral QHS   umeclidinium bromide   1 puff Inhalation Daily   Continuous Infusions:  lactated ringers  150 mL/hr at 10/18/23 0729   meropenem  (MERREM ) IV 200 mL/hr at 10/18/23 0729          Sophie Mao, MD [facility] 10/18/2023, 8:03 AM

## 2023-10-18 NOTE — Progress Notes (Signed)
    Patient Name: TAMMY WICKLIFFE           DOB: July 02, 1971  MRN: [id number]      Admission Date: 10/16/2023  Attending Provider: Celinda Alm Lot, MD  Primary Diagnosis: Sepsis secondary to UTI [facility])   Level of care: Telemetry    CROSS COVER NOTE   Date of Service   10/18/2023   JERILYN GILLASPIE, 53 y.o. female, was admitted on 10/16/2023 for Sepsis secondary to UTI [facility]).    HPI/Events of Note   Borderline BP, 80/50.   Asymptomatic.  All other vital stable.  Afebrile.  Check lactic acid.   No large volume loss or obvious bleeding reported. Patient recently received atenolol , unclear as to what BP was like then.  However at 1849, BP was 107/56.  Hold atenolol  if SBP less than 120.    Will order 250cc fluid bolus, continue monitoring BP.  Obtain lactic acid.  Addendum: Lactic acid negative BP improving, continue to monitor.    Interventions/ Plan   Fluid bolus Hold antihypertensive meds, opioids, sedating agents. Monitor BP every 2 hours x 2        Deavon Podgorski, DNP, ACNPC- AG [facility] [place]

## 2023-10-18 NOTE — Progress Notes (Signed)
 PHARMACY - PHYSICIAN COMMUNICATION CRITICAL VALUE ALERT - BLOOD CULTURE IDENTIFICATION (BCID)  Karla Hoover is an 53 y.o. female who presented to [facility] on 10/16/2023 with a chief complaint of sepsis secondary to UTI  Assessment:  1/4 staph species, no resistance  Name of physician (or Provider) Contacted: Andrez  Current antibiotics: merrem   Changes to prescribed antibiotics recommended:  None, probable contaminant  Results for orders placed or performed during the hospital encounter of 10/16/23  Blood Culture ID Panel (Reflexed) (Collected: 10/17/2023  1:18 AM)  Result Value Ref Range   Enterococcus faecalis NOT DETECTED NOT DETECTED   Enterococcus Faecium NOT DETECTED NOT DETECTED   Listeria monocytogenes NOT DETECTED NOT DETECTED   Staphylococcus species DETECTED (A) NOT DETECTED   Staphylococcus aureus (BCID) NOT DETECTED NOT DETECTED   Staphylococcus epidermidis NOT DETECTED NOT DETECTED   Staphylococcus lugdunensis NOT DETECTED NOT DETECTED   Streptococcus species NOT DETECTED NOT DETECTED   Streptococcus agalactiae NOT DETECTED NOT DETECTED   Streptococcus pneumoniae NOT DETECTED NOT DETECTED   Streptococcus pyogenes NOT DETECTED NOT DETECTED   A.calcoaceticus-baumannii NOT DETECTED NOT DETECTED   Bacteroides fragilis NOT DETECTED NOT DETECTED   Enterobacterales NOT DETECTED NOT DETECTED   Enterobacter cloacae complex NOT DETECTED NOT DETECTED   Escherichia coli NOT DETECTED NOT DETECTED   Klebsiella aerogenes NOT DETECTED NOT DETECTED   Klebsiella oxytoca NOT DETECTED NOT DETECTED   Klebsiella pneumoniae NOT DETECTED NOT DETECTED   Proteus species NOT DETECTED NOT DETECTED   Salmonella species NOT DETECTED NOT DETECTED   Serratia marcescens NOT DETECTED NOT DETECTED   Haemophilus influenzae NOT DETECTED NOT DETECTED   Neisseria meningitidis NOT DETECTED NOT DETECTED   Pseudomonas aeruginosa NOT DETECTED NOT DETECTED   Stenotrophomonas maltophilia NOT DETECTED  NOT DETECTED   Candida albicans NOT DETECTED NOT DETECTED   Candida auris NOT DETECTED NOT DETECTED   Candida glabrata NOT DETECTED NOT DETECTED   Candida krusei NOT DETECTED NOT DETECTED   Candida parapsilosis NOT DETECTED NOT DETECTED   Candida tropicalis NOT DETECTED NOT DETECTED   Cryptococcus neoformans/gattii NOT DETECTED NOT DETECTED    Leeroy Mace RPh 10/18/2023, 12:22 AM

## 2023-10-18 NOTE — Evaluation (Signed)
 Physical Therapy Evaluation Patient Details Name: Karla Hoover MRN: [id number] DOB: 24-Jul-1971 Today's Date: 10/18/2023  History of Present Illness  Karla Hoover is a 53 y.o. female with medical history significant of asthma, seasonal allergies, stage 3a CKD, bipolar disorder, class I obesity, tobacco abuse, COPD with chronic bronchitis and chronic respiratory failure with hypoxia, type II DM, hypertension, GERD, grade 1 diastolic dysfunction, hypothyroidism, hyperlipidemia, bilateral pulmonary embolism, DVT, history of other non-hemorrhagic stroke with residual left-sided hemiparesis who presented to the emergency department complaints of having a very large volume emesis associated with nausea, fever, headache, body aches, fatigue, malaise, dysuria, suprapubic and bilateral flank pain.  Clinical Impression  Pt admitted with above diagnosis. Pt is at baseline level and able to transfer bed > chair at MOD I level. Recommend returning to her LTC facility at time of d/c.        If plan is discharge home, recommend the following:     Can travel by private vehicle        Equipment Recommendations    Recommendations for Other Services       Functional Status Assessment Patient has not had a recent decline in their functional status     Precautions / Restrictions Precautions Precautions: Fall Restrictions Weight Bearing Restrictions Per Provider Order: No      Mobility  Bed Mobility Overal bed mobility: Modified Independent                  Transfers Overall transfer level: Modified independent Equipment used: None               General transfer comment: Performed squat pivot transfer with MOD I.    Ambulation/Gait                  Stairs            Wheelchair Mobility     Tilt Bed    Modified Rankin (Stroke Patients Only)       Balance Overall balance assessment: Mild deficits observed, not formally tested                                            Pertinent Vitals/Pain Pain Assessment Pain Assessment: 0-10 Pain Score: 7  Pain Location: neck Pain Descriptors / Indicators: Discomfort Pain Intervention(s): Monitored during session, Repositioned    Home Living Family/patient expects to be discharged to:: Skilled nursing facility                   Additional Comments: [facility]    Prior Function               Mobility Comments: Pt reports performing transfers mod I, reports gets in [facility] independently. Propels manual w/c with R UE and B legs ADLs Comments: MOD I with toileting, A with showers     Extremity/Trunk Assessment   Upper Extremity Assessment Upper Extremity Assessment: LUE deficits/detail LUE Deficits / Details: hemiparesis    Lower Extremity Assessment Lower Extremity Assessment: Overall WFL for tasks assessed       Communication   Communication Communication: No apparent difficulties  Cognition Arousal: Alert Behavior During Therapy: WFL for tasks assessed/performed Overall Cognitive Status: Within Functional Limits for tasks assessed  General Comments      Exercises     Assessment/Plan    PT Assessment Patient does not need any further PT services  PT Problem List         PT Treatment Interventions      PT Goals (Current goals can be found in the Care Plan section)  Acute Rehab PT Goals PT Goal Formulation: All assessment and education complete, DC therapy    Frequency       Co-evaluation               AM-PAC PT 6 Clicks Mobility  Outcome Measure Help needed turning from your back to your side while in a flat bed without using bedrails?: A Little Help needed moving from lying on your back to sitting on the side of a flat bed without using bedrails?: A Little Help needed moving to and from a bed to a chair (including a wheelchair)?: None Help needed standing up  from a chair using your arms (e.g., wheelchair or bedside chair)?: None Help needed to walk in hospital room?: Total Help needed climbing 3-5 steps with a railing? : Total 6 Click Score: 16    End of Session Equipment Utilized During Treatment: Gait belt Activity Tolerance: Patient tolerated treatment well Patient left: in chair Nurse Communication: Mobility status;Other (comment) (neck pain) PT Visit Diagnosis: Other abnormalities of gait and mobility (R26.89)    Time: 8894-8874 PT Time Calculation (min) (ACUTE ONLY): 20 min   Charges:   PT Evaluation $PT Eval Low Complexity: 1 Low   PT General Charges $$ ACUTE PT VISIT: 1 Visit         Karla RAMAN., PT Office [phone] Acute Rehab 10/18/2023   Karla Hoover 10/18/2023, 12:40 PM

## 2023-10-19 DIAGNOSIS — A419 Sepsis, unspecified organism: Secondary | ICD-10-CM | POA: Diagnosis not present

## 2023-10-19 DIAGNOSIS — N390 Urinary tract infection, site not specified: Secondary | ICD-10-CM | POA: Diagnosis not present

## 2023-10-19 LAB — GLUCOSE, CAPILLARY
Glucose-Capillary: 171 mg/dL — ABNORMAL HIGH (ref 70–99)
Glucose-Capillary: 148 mg/dL — ABNORMAL HIGH (ref 70–99)
Glucose-Capillary: 183 mg/dL — ABNORMAL HIGH (ref 70–99)
Glucose-Capillary: 154 mg/dL — ABNORMAL HIGH (ref 70–99)

## 2023-10-19 LAB — BASIC METABOLIC PANEL
Anion gap: 9 (ref 5–15)
BUN: 15 mg/dL (ref 6–20)
CO2: 28 mmol/L (ref 22–32)
Calcium: 8.8 mg/dL — ABNORMAL LOW (ref 8.9–10.3)
Chloride: 100 mmol/L (ref 98–111)
Creatinine, Ser: 1.1 mg/dL — ABNORMAL HIGH (ref 0.44–1.00)
GFR, Estimated: >60 mL/min (ref >60)
Glucose, Bld: 181 mg/dL — ABNORMAL HIGH (ref 70–99)
Potassium: 4.2 mmol/L (ref 3.5–5.1)
Sodium: 137 mmol/L (ref 135–145)

## 2023-10-19 LAB — CBC WITH DIFFERENTIAL/PLATELET
Abs Immature Granulocytes: 0.02 10*3/uL (ref 0.00–0.07)
Basophils Absolute: 0 10*3/uL (ref 0.0–0.1)
Basophils Relative: 0 %
Eosinophils Absolute: 0.2 10*3/uL (ref 0.0–0.5)
Eosinophils Relative: 2 %
HCT: 32.3 % — ABNORMAL LOW (ref 36.0–46.0)
Hemoglobin: 10.2 g/dL — ABNORMAL LOW (ref 12.0–15.0)
Immature Granulocytes: 0 %
Lymphocytes Relative: 35 %
Lymphs Abs: 2.5 10*3/uL (ref 0.7–4.0)
MCH: 27.6 pg (ref 26.0–34.0)
MCHC: 31.6 g/dL (ref 30.0–36.0)
MCV: 87.5 fL (ref 80.0–100.0)
Monocytes Absolute: 0.7 10*3/uL (ref 0.1–1.0)
Monocytes Relative: 9 %
Neutro Abs: 3.8 10*3/uL (ref 1.7–7.7)
Neutrophils Relative %: 54 %
Platelets: 180 10*3/uL (ref 150–400)
RBC: 3.69 MIL/uL — ABNORMAL LOW (ref 3.87–5.11)
RDW: 13.8 % (ref 11.5–15.5)
WBC: 7.2 10*3/uL (ref 4.0–10.5)
nRBC: 0 % (ref 0.0–0.2)

## 2023-10-19 LAB — MAGNESIUM: Magnesium: 1.9 mg/dL (ref 1.7–2.4)

## 2023-10-19 LAB — C-REACTIVE PROTEIN: CRP: 11 mg/dL — ABNORMAL HIGH (ref <1.0)

## 2023-10-19 NOTE — Plan of Care (Signed)
  Problem: Skin Integrity: Goal: Risk for impaired skin integrity will decrease Outcome: Progressing   Problem: Clinical Measurements: Goal: Diagnostic test results will improve Outcome: Progressing   Problem: Education: Goal: Knowledge of General Education information will improve Description: Including pain rating scale, medication(s)/side effects and non-pharmacologic comfort measures Outcome: Progressing   Problem: Clinical Measurements: Goal: Cardiovascular complication will be avoided Outcome: Progressing   Problem: Nutrition: Goal: Adequate nutrition will be maintained Outcome: Progressing   Problem: Coping: Goal: Level of anxiety will decrease Outcome: Progressing

## 2023-10-19 NOTE — Progress Notes (Signed)
 PROGRESS NOTE    Karla Hoover  FMW:[id number] DOB: Jul 01, 1971 DOA: 10/16/2023 PCP: Pcp, No   Brief Narrative:  53 y.o. female with medical history significant of asthma, seasonal allergies, stage 3a CKD, bipolar disorder, class I obesity, tobacco abuse, COPD with chronic bronchitis and chronic respiratory failure with hypoxia, type II DM, hypertension, GERD, grade 1 diastolic dysfunction, hypothyroidism, hyperlipidemia, bilateral pulmonary embolism, DVT, history of other non-hemorrhagic stroke with residual left-sided hemiparesis and recent admission last month for ESBL E. coli and Klebsiella UTI treated with antibiotics presented with nausea, vomiting, fever, dysuria and suprapubic/flank pain.  On presentation, UA was suggestive of UTI; WBCs of 17, creatinine of 1.44 and lactic acid of 2.2.  Chest x-ray showed no active disease.  CT renal stone study showed no acute findings in the abdomen or pelvis with no nephrolithiasis or obstructive uropathy.  She was started on IV fluids and broad-spectrum antibiotics.  Assessment & Plan:   Sepsis: Present on admission UTI: Present on admission Lactic acidosis: Resolved -Presented with fever, lactic acidosis, leukocytosis with possible UTI -Had ESBL E. coli during last admission.  Continue meropenem .  Urine cultures had insignificant growth.  Blood culture growing gram-positive cocci in anaerobic bottle only and 1 set, possibly contaminant. -Currently afebrile and hemodynamically stable.  Leukocytosis -Resolved  Diabetes mellitus type 2 with hyperglycemia Peripheral neuropathy -Carb modified diet.  Continue long-acting insulin  along with CBGs with SSI.  Continue gabapentin   Chronic respiratory failure with hypoxia COPD -Stable.  Continue supplemental oxygen .  Continue current inhaled therapy.  Outpatient follow-up with pulmonary  Essential hypertension--losartan  on hold.  Continue atenolol   History of unspecified CVA with left-sided  hemiparesis Hyperlipidemia -Continue supportive care.  Outpatient follow-up with neurology.  Continue ezetimibe /statin.  PT eval  History of PE/DVT -Continue Eliquis   Grade 1 diastolic dysfunction -Continue beta-blocker.  Losartan  on hold for now.  CKD stage IIIa -Creatinine currently stable.  Monitor  Anxiety/depression -Continue fluoxetine   Prolonged QT -Monitor on telemetry.  Avoid QT prolonging meds as much as possible  Obesity -Outpatient follow-up    DVT prophylaxis:  Eliquis  Code Status: Full Family Communication: None at bedside Disposition Plan: Status is: Inpatient Remains inpatient appropriate because: Of severity of illness    Consultants: None  Procedures: None  Antimicrobials:  Anti-infectives (From admission, onward)    Start     Dose/Rate Route Frequency Ordered Stop   10/18/23 0600  cefTRIAXone  (ROCEPHIN ) 2 g in sodium chloride  0.9 % 100 mL IVPB  Status:  Discontinued        2 g 200 mL/hr over 30 Minutes Intravenous Every 24 hours 10/17/23 0747 10/17/23 0902   10/17/23 0900  meropenem  (MERREM ) 1 g in sodium chloride  0.9 % 100 mL IVPB        1 g 200 mL/hr over 30 Minutes Intravenous Every 8 hours 10/17/23 0858     10/17/23 0145  cefTRIAXone  (ROCEPHIN ) 2 g in sodium chloride  0.9 % 100 mL IVPB        2 g 200 mL/hr over 30 Minutes Intravenous Once 10/17/23 0132 10/17/23 0305      '  Subjective: Patient seen and examined at bedside.  Denies worsening abdominal pain, fever or vomiting.  Feeling slightly better.  Objective: Vitals:   10/18/23 1138 10/18/23 1820 10/18/23 2100 10/19/23 0410  BP: 128/73  134/64 (!) 118/56  Pulse: 85  85 77  Resp:    14  Temp: 99.3 F (37.4 C)  99.6 F (37.6 C) 99.2 F (37.3 C)  TempSrc: Oral  Oral Oral  SpO2: (!) 87% 94%  94%  Weight:      Height:        Intake/Output Summary (Last 24 hours) at 10/19/2023 0711 Last data filed at 10/19/2023 0413 Gross per 24 hour  Intake 2814.05 ml  Output 1000 ml   Net 1814.05 ml   Filed Weights   10/17/23 1628  Weight: 94 kg    Examination:  General: On room air.  No distress.  Looks chronically ill and deconditioned. ENT/neck: No thyromegaly.  JVD is not elevated  respiratory: Decreased breath sounds at bases bilaterally with some crackles; no wheezing  CVS: S1-S2 heard, rate controlled currently Abdominal: Soft, obese, nontender, slightly distended; no organomegaly, bowel sounds are heard Extremities: Trace lower extremity edema; no cyanosis  CNS: Awake and alert.  Left-sided weakness present.   Lymph: No obvious lymphadenopathy Skin: No obvious ecchymosis/lesions  psych: Affect is mostly flat.  Not agitated currently.   Musculoskeletal: No obvious joint swelling/deformity   Data Reviewed: I have personally reviewed following labs and imaging studies  CBC: Recent Labs  Lab 10/17/23 0107 10/17/23 1948 10/18/23 0521 10/19/23 0448  WBC 17.0*  --  10.1 7.2  NEUTROABS  --   --  6.2 3.8  HGB 13.2 11.4* 10.7* 10.2*  HCT 41.1 34.8* 34.6* 32.3*  MCV 86.3  --  88.7 87.5  PLT 232  --  166 180   Basic Metabolic Panel: Recent Labs  Lab 10/17/23 0107 10/18/23 0521 10/19/23 0448  NA 130* 136 137  K 5.0 4.3 4.2  CL 96* 100 100  CO2 25 28 28   GLUCOSE 271* 163* 181*  BUN 25* 21* 15  CREATININE 1.44* 1.41* 1.10*  CALCIUM  8.9 8.7* 8.8*  MG  --   --  1.9   GFR: Estimated Creatinine Clearance: 71.9 mL/min (A) (by C-G formula based on SCr of 1.1 mg/dL (H)). Liver Function Tests: Recent Labs  Lab 10/17/23 0107 10/18/23 0521  AST 22 14*  ALT 14 11  ALKPHOS 64 47  BILITOT 0.7 0.8  PROT 7.6 5.9*  ALBUMIN 4.0 2.6*   Recent Labs  Lab 10/17/23 0107  LIPASE 22   No results for input(s): AMMONIA in the last 168 hours. Coagulation Profile: Recent Labs  Lab 10/17/23 0107  INR 1.6*   Cardiac Enzymes: No results for input(s): CKTOTAL, CKMB, CKMBINDEX, TROPONINI in the last 168 hours. BNP (last 3 results) No results  for input(s): PROBNP in the last 8760 hours. HbA1C: No results for input(s): HGBA1C in the last 72 hours. CBG: Recent Labs  Lab 10/17/23 2129 10/18/23 0727 10/18/23 1201 10/18/23 1619 10/18/23 2129  GLUCAP 150* 165* 176* 133* 165*   Lipid Profile: No results for input(s): CHOL, HDL, LDLCALC, TRIG, CHOLHDL, LDLDIRECT in the last 72 hours. Thyroid Function Tests: No results for input(s): TSH, T4TOTAL, FREET4, T3FREE, THYROIDAB in the last 72 hours. Anemia Panel: No results for input(s): VITAMINB12, FOLATE, FERRITIN, TIBC, IRON, RETICCTPCT in the last 72 hours. Sepsis Labs: Recent Labs  Lab 10/17/23 0125 10/17/23 0302 10/17/23 2230  LATICACIDVEN 2.2* 2.2* 0.9    Recent Results (from the past 240 hours)  Resp panel by RT-PCR (RSV, Flu A&B, Covid) Anterior Nasal Swab     Status: None   Collection Time: 10/16/23 11:17 PM   Specimen: Anterior Nasal Swab  Result Value Ref Range Status   SARS Coronavirus 2 by RT PCR NEGATIVE NEGATIVE Final    Comment: (NOTE) SARS-CoV-2 target nucleic acids are NOT  DETECTED.  The SARS-CoV-2 RNA is generally detectable in upper respiratory specimens during the acute phase of infection. The lowest concentration of SARS-CoV-2 viral copies this assay can detect is 138 copies/mL. A negative result does not preclude SARS-Cov-2 infection and should not be used as the sole basis for treatment or other patient management decisions. A negative result may occur with  improper specimen collection/handling, submission of specimen other than nasopharyngeal swab, presence of viral mutation(s) within the areas targeted by this assay, and inadequate number of viral copies(<138 copies/mL). A negative result must be combined with clinical observations, patient history, and epidemiological information. The expected result is Negative.  Fact Sheet for Patients:  bloggercourse.com  Fact Sheet for  Healthcare Providers:  seriousbroker.it  This test is no t yet approved or cleared by the [place]  FDA and  has been authorized for detection and/or diagnosis of SARS-CoV-2 by FDA under an Emergency Use Authorization (EUA). This EUA will remain  in effect (meaning this test can be used) for the duration of the COVID-19 declaration under Section 564(b)(1) of the Act, 21 U.S.C.section 360bbb-3(b)(1), unless the authorization is terminated  or revoked sooner.       Influenza A by PCR NEGATIVE NEGATIVE Final   Influenza B by PCR NEGATIVE NEGATIVE Final    Comment: (NOTE) The Xpert Xpress SARS-CoV-2/FLU/RSV plus assay is intended as an aid in the diagnosis of influenza from Nasopharyngeal swab specimens and should not be used as a sole basis for treatment. Nasal washings and aspirates are unacceptable for Xpert Xpress SARS-CoV-2/FLU/RSV testing.  Fact Sheet for Patients: bloggercourse.com  Fact Sheet for Healthcare Providers: seriousbroker.it  This test is not yet approved or cleared by the [place]  FDA and has been authorized for detection and/or diagnosis of SARS-CoV-2 by FDA under an Emergency Use Authorization (EUA). This EUA will remain in effect (meaning this test can be used) for the duration of the COVID-19 declaration under Section 564(b)(1) of the Act, 21 U.S.C. section 360bbb-3(b)(1), unless the authorization is terminated or revoked.     Resp Syncytial Virus by PCR NEGATIVE NEGATIVE Final    Comment: (NOTE) Fact Sheet for Patients: bloggercourse.com  Fact Sheet for Healthcare Providers: seriousbroker.it  This test is not yet approved or cleared by the [place]  FDA and has been authorized for detection and/or diagnosis of SARS-CoV-2 by FDA under an Emergency Use Authorization (EUA). This EUA will remain in effect (meaning this  test can be used) for the duration of the COVID-19 declaration under Section 564(b)(1) of the Act, 21 U.S.C. section 360bbb-3(b)(1), unless the authorization is terminated or revoked.  Performed at [facility], [address], [place], [place] [address]   Urine Culture     Status: Abnormal   Collection Time: 10/17/23  1:07 AM   Specimen: Urine, Random  Result Value Ref Range Status   Specimen Description   Final    URINE, RANDOM Performed at [facility], [address], [place], [place] [address]    Special Requests   Final    NONE Reflexed from [id number] Performed at [facility], [address] [place], [place] [address]    Culture (A)  Final    <10,000 COLONIES/mL INSIGNIFICANT GROWTH Performed at [facility], [address] [place], [place] [address]    Report Status 10/18/2023 FINAL  Final  Blood Culture (routine x 2)     Status: None (Preliminary result)   Collection Time: 10/17/23  1:18 AM  Specimen: BLOOD LEFT FOREARM  Result Value Ref Range Status   Specimen Description   Final    BLOOD LEFT FOREARM Performed at [facility], [address] [place], [place] [address]    Special Requests   Final    BOTTLES DRAWN AEROBIC AND ANAEROBIC Blood Culture adequate volume Performed at [facility], [address] [place], [place] [address]    Culture  Setup Time   Final    GRAM POSITIVE COCCI ANAEROBIC BOTTLE ONLY CRITICAL RESULT CALLED TO, READ BACK BY AND VERIFIED WITH: PHARMD E JACKSON 10/18/2023 @ 0003 BY AB    Culture   Final    GRAM POSITIVE COCCI TOO YOUNG TO READ Performed at [facility], [address], [place], [place] [address]    Report Status PENDING  Incomplete  Blood Culture (routine x 2)     Status: None (Preliminary result)   Collection Time: 10/17/23  1:18 AM   Specimen: BLOOD RIGHT FOREARM  Result Value Ref Range Status   Specimen  Description   Final    BLOOD RIGHT FOREARM Performed at [facility], [address] [place], [place] [address]    Special Requests   Final    BOTTLES DRAWN AEROBIC AND ANAEROBIC Blood Culture adequate volume Performed at [facility], [address] [place], [place] [address]    Culture   Final    NO GROWTH 1 DAY Performed at [facility], [address], [place], [place] [address]    Report Status PENDING  Incomplete  Blood Culture ID Panel (Reflexed)     Status: Abnormal   Collection Time: 10/17/23  1:18 AM  Result Value Ref Range Status   Enterococcus faecalis NOT DETECTED NOT DETECTED Final   Enterococcus Faecium NOT DETECTED NOT DETECTED Final   Listeria monocytogenes NOT DETECTED NOT DETECTED Final   Staphylococcus species DETECTED (A) NOT DETECTED Final    Comment: CRITICAL RESULT CALLED TO, READ BACK BY AND VERIFIED WITH: PHARMD E JACKSON 10/18/2023 @ 0003 BY AB    Staphylococcus aureus (BCID) NOT DETECTED NOT DETECTED Final   Staphylococcus epidermidis NOT DETECTED NOT DETECTED Final   Staphylococcus lugdunensis NOT DETECTED NOT DETECTED Final   Streptococcus species NOT DETECTED NOT DETECTED Final   Streptococcus agalactiae NOT DETECTED NOT DETECTED Final   Streptococcus pneumoniae NOT DETECTED NOT DETECTED Final   Streptococcus pyogenes NOT DETECTED NOT DETECTED Final   A.calcoaceticus-baumannii NOT DETECTED NOT DETECTED Final   Bacteroides fragilis NOT DETECTED NOT DETECTED Final   Enterobacterales NOT DETECTED NOT DETECTED Final   Enterobacter cloacae complex NOT DETECTED NOT DETECTED Final   Escherichia coli NOT DETECTED NOT DETECTED Final   Klebsiella aerogenes NOT DETECTED NOT DETECTED Final   Klebsiella oxytoca NOT DETECTED NOT DETECTED Final   Klebsiella pneumoniae NOT DETECTED NOT DETECTED Final   Proteus species NOT DETECTED NOT DETECTED Final   Salmonella species NOT DETECTED NOT DETECTED Final   Serratia  marcescens NOT DETECTED NOT DETECTED Final   Haemophilus influenzae NOT DETECTED NOT DETECTED Final   Neisseria meningitidis NOT DETECTED NOT DETECTED Final   Pseudomonas aeruginosa NOT DETECTED NOT DETECTED Final   Stenotrophomonas maltophilia NOT DETECTED NOT DETECTED Final   Candida albicans NOT DETECTED NOT DETECTED Final   Candida auris NOT DETECTED NOT DETECTED Final   Candida glabrata NOT DETECTED NOT DETECTED Final   Candida krusei NOT DETECTED NOT DETECTED Final   Candida parapsilosis NOT DETECTED NOT  DETECTED Final   Candida tropicalis NOT DETECTED NOT DETECTED Final   Cryptococcus neoformans/gattii NOT DETECTED NOT DETECTED Final    Comment: Performed at [facility], [address], [place], [place] [address]         Radiology Studies: No results found.       Scheduled Meds:  apixaban   5 mg Oral BID   atenolol   50 mg Oral BID   atorvastatin   80 mg Oral QHS   ezetimibe   10 mg Oral QHS   FLUoxetine   20 mg Oral Daily   gabapentin   400 mg Oral TID   insulin  aspart  0-15 Units Subcutaneous TID WC   insulin  glargine-yfgn  10 Units Subcutaneous QHS   levothyroxine   88 mcg Oral Q0600   magnesium  oxide  400 mg Oral QODAY   melatonin  5 mg Oral QHS   mometasone -formoterol   2 puff Inhalation BID   pantoprazole   20 mg Oral Daily   QUEtiapine   25 mg Oral QHS   rOPINIRole   0.25 mg Oral QHS   umeclidinium bromide   1 puff Inhalation Daily   Continuous Infusions:  meropenem  (MERREM ) IV 1 g (10/19/23 0608)          Sophie Mao, MD [facility] 10/19/2023, 7:11 AM

## 2023-10-20 ENCOUNTER — Inpatient Hospital Stay (HOSPITAL_COMMUNITY): Payer: Medicaid Other

## 2023-10-20 DIAGNOSIS — N390 Urinary tract infection, site not specified: Secondary | ICD-10-CM | POA: Diagnosis not present

## 2023-10-20 DIAGNOSIS — I38 Endocarditis, valve unspecified: Secondary | ICD-10-CM

## 2023-10-20 DIAGNOSIS — A419 Sepsis, unspecified organism: Secondary | ICD-10-CM | POA: Diagnosis not present

## 2023-10-20 LAB — BLOOD CULTURE ID PANEL (REFLEXED) - BCID2

## 2023-10-20 LAB — GLUCOSE, CAPILLARY
Glucose-Capillary: 155 mg/dL — ABNORMAL HIGH (ref 70–99)
Glucose-Capillary: 134 mg/dL — ABNORMAL HIGH (ref 70–99)
Glucose-Capillary: 178 mg/dL — ABNORMAL HIGH (ref 70–99)
Glucose-Capillary: 174 mg/dL — ABNORMAL HIGH (ref 70–99)

## 2023-10-20 LAB — C-REACTIVE PROTEIN: CRP: 7.6 mg/dL — ABNORMAL HIGH (ref <1.0)

## 2023-10-20 LAB — BASIC METABOLIC PANEL
Anion gap: 12 (ref 5–15)
BUN: 12 mg/dL (ref 6–20)
CO2: 29 mmol/L (ref 22–32)
Calcium: 9.4 mg/dL (ref 8.9–10.3)
Chloride: 96 mmol/L — ABNORMAL LOW (ref 98–111)
Creatinine, Ser: 1.02 mg/dL — ABNORMAL HIGH (ref 0.44–1.00)
GFR, Estimated: >60 mL/min (ref >60)
Glucose, Bld: 152 mg/dL — ABNORMAL HIGH (ref 70–99)
Potassium: 4.3 mmol/L (ref 3.5–5.1)
Sodium: 137 mmol/L (ref 135–145)

## 2023-10-20 LAB — ECHOCARDIOGRAM COMPLETE
AR max vel: 1.89 cm2
AV Area VTI: 1.76 cm2
AV Area mean vel: 1.83 cm2
AV Mean grad: 5 mm[Hg]
AV Peak grad: 9.7 mm[Hg]
Ao pk vel: 1.56 m/s
Area-P 1/2: 3.68 cm2
S' Lateral: 3.1 cm

## 2023-10-20 LAB — MAGNESIUM: Magnesium: 1.8 mg/dL (ref 1.7–2.4)

## 2023-10-20 MED ORDER — IOHEXOL 300 MG/ML  SOLN
15.0000 mL | INTRAMUSCULAR | Status: AC
  Administered 2023-10-20 (×2): 15 mL via ORAL

## 2023-10-20 MED ORDER — NITROFURANTOIN MONOHYD MACRO 100 MG PO CAPS
100.0000 mg | ORAL_CAPSULE | Freq: Every day | ORAL | Status: DC
Start: 2023-10-20 — End: 2023-10-25
  Administered 2023-10-20 – 2023-10-24 (×5): 100 mg via ORAL
  Filled 2023-10-20 (×5): qty 1

## 2023-10-20 MED ORDER — SODIUM CHLORIDE 0.9 % IV SOLN
100.0000 mg | Freq: Every day | INTRAVENOUS | Status: DC
  Administered 2023-10-20 – 2023-10-24 (×5): 100 mg via INTRAVENOUS
  Filled 2023-10-20 (×5): qty 5

## 2023-10-20 MED ORDER — LOSARTAN POTASSIUM 25 MG PO TABS
37.5000 mg | ORAL_TABLET | Freq: Every day | ORAL | Status: DC
  Administered 2023-10-20 – 2023-10-24 (×5): 37.5 mg via ORAL
  Filled 2023-10-20 (×5): qty 2

## 2023-10-20 MED ORDER — IOHEXOL 300 MG/ML  SOLN
100.0000 mL | Freq: Once | INTRAMUSCULAR | Status: AC | PRN
  Administered 2023-10-20: 100 mL via INTRAVENOUS

## 2023-10-20 NOTE — Consult Note (Signed)
 [facility]    Date of Admission:  10/16/2023   Total days of inpatient antibiotics 2        Reason for Consult: fungemia     Principal Problem:   Sepsis secondary to UTI [facility]) Active Problems:   Essential hypertension   COPD with chronic bronchitis (HCC)   hx of PE/DVT   Chronic respiratory failure with hypoxia (HCC)   Type 2 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 disease, with long-term current use of insulin  (HCC)   GERD without esophagitis   Hypothyroidism   Grade I diastolic dysfunction   Hemiparesis affecting left side as late effect of cerebrovascular accident (CVA) (HCC)   Class 1 obesity due to excess calories with body mass index (BMI) of 32.0 to 32.9 in adult   Dyslipidemia   Anxiety   Depression   Prolonged QT interval   Assessment: 53 year old female with history of ESBL E. coli UTI on suppressive Macrobid , diabetes mellitus, CKD, tobacco abuse admitted with sepsis secondary to presumed UTI, ID engaged for: #Candida glabrata fungemia - 1/10 blood culture growing yeast 1/2 set, Staph hominis out of 1 out of 2 sets contaminant.  BC ID positive for Candida glabrata - Urine cultures less than 10,000 colony - CT renal study showed no nephrolithiasis, obstruction uropathy no acute findings in noncontrast CT abdomen pelvis Recommendations:  -Start micafungin  -D/C merrem  -Follow blood Cx -Repeat blood Cx to ensure clearance -Given abdominal pain although improved and unclear source of yeast will get CT with contrast  #History of recurrent UTI - Continue suppressive Macrobid  #Diabetes mellitus #Tobacco abuse Microbiology:   Antibiotics: Ceftriaxone  1/9 Merrem  1/10-  Cultures: Blood 1/10 1/2 yeast and Staph hominis Urine 1/10 less than 10,000 colonies Other   HPI: Karla ARMENTEROS is a 53 y.o. female significant for asthma, seasonal allergy, CKD stage III, bipolar disorder, class I obesity, tobacco abuse with  COPD chronic bronchitis, diabetes, hypertension, GERD, grade 1 diastolic heart failure, hypothyroidism, hyperlipidemia, bilateral pulmonary embolism/DVT with history of heart nonhemorrhagic stroke and residual left-sided hemiparesis,  ESBL E. coli and Klebsiella UTI treated with Merrem  x 3 days then continued on suppressive Macrobid  presented with nausea, vomiting, fever, dysuria and suprapubic/flank pain.  On arrival patient had a temp of 101.3, WBC 17K.  Mated for sepsis secondary to UTI.  Started on meropenem .  ID engaged with blood cultures positive for yeast.   Review of Systems: Review of Systems  All other systems reviewed and are negative.   Past Medical History:  Diagnosis Date   Allergies    Asthma    Bilateral pulmonary embolism (HCC) 11/15/2019   Bipolar disorder (HCC)    Chronic kidney disease, stage 3a (HCC) 02/09/2021   Chronic respiratory failure with hypoxia (HCC) 02/09/2021   COPD (chronic obstructive pulmonary disease) (HCC)    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   Former smoker 36 pack year smoking history     Diabetes mellitus without complication (HCC)    Essential hypertension 05/04/2009   Qualifier: Diagnosis of  By: Lang CMA, Jessica     GERD without esophagitis 02/09/2021   Grade I diastolic dysfunction 12/02/2021   Hypertension    Hypothyroidism 02/09/2021   Mixed 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 (HCC) 02/09/2021   Pulmonary embolism (HCC) 11/16/2019   Stroke (HCC)    Type 2 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 disease, with long-term current use of insulin  (HCC)  02/09/2021    Social History   Tobacco Use   Smoking status: Former    Current packs/day: 0.00    Average packs/day: 0.5 packs/day for 36.1 years (18.1 ttl pk-yrs)    Types: Cigarettes    Start date: 10/08/1983    Quit date: 11/22/2019    Years since quitting: 3.9   Smokeless tobacco: Never  Vaping Use   Vaping status: Never Used   Substance Use Topics   Alcohol use: No   Drug use: No    Family History  Problem Relation Age of Onset   Diabetes Mother    COPD Sister    Heart failure Sister    Breast cancer Sister    Scheduled Meds:  apixaban   5 mg Oral BID   atenolol   50 mg Oral BID   atorvastatin   80 mg Oral QHS   ezetimibe   10 mg Oral QHS   FLUoxetine   20 mg Oral Daily   gabapentin   400 mg Oral TID   insulin  aspart  0-15 Units Subcutaneous TID WC   insulin  glargine-yfgn  10 Units Subcutaneous QHS   levothyroxine   88 mcg Oral Q0600   losartan   37.5 mg Oral Daily   magnesium  oxide  400 mg Oral QODAY   melatonin  5 mg Oral QHS   mometasone -formoterol   2 puff Inhalation BID   pantoprazole   20 mg Oral Daily   QUEtiapine   25 mg Oral QHS   rOPINIRole   0.25 mg Oral QHS   umeclidinium bromide   1 puff Inhalation Daily   Continuous Infusions:  micafungin  (MYCAMINE ) 100 mg in sodium chloride  0.9 % 100 mL IVPB 100 mg (10/20/23 1118)   PRN Meds:.acetaminophen  **OR** acetaminophen , prochlorperazine  Allergies  Allergen Reactions   Cipro  [Ciprofloxacin  Hcl] Hives   Guaifenesin  Anaphylaxis   Kiwi Extract Anaphylaxis and Rash   Levaquin [Levofloxacin] Shortness Of Breath and Rash   Strawberry Extract Anaphylaxis and Rash   Lioresal [Baclofen] Other (See Comments)    Near syncope/ fall    OBJECTIVE: Blood pressure (!) 158/78, pulse 85, temperature 99.8 F (37.7 C), temperature source Oral, resp. rate 19, height 5' 7.5 (1.715 m), weight 94 kg, last menstrual period 10/20/2019, SpO2 92%.  Physical Exam Constitutional:      Appearance: Normal appearance.  HENT:     Head: Normocephalic and atraumatic.     Right Ear: Tympanic membrane normal.     Left Ear: Tympanic membrane normal.     Nose: Nose normal.     Mouth/Throat:     Mouth: Mucous membranes are moist.  Eyes:     Extraocular Movements: Extraocular movements intact.     Conjunctiva/sclera: Conjunctivae normal.     Pupils: Pupils are equal,  round, and reactive to light.  Cardiovascular:     Rate and Rhythm: Normal rate and regular rhythm.     Heart sounds: No murmur heard.    No friction rub. No gallop.  Pulmonary:     Effort: Pulmonary effort is normal.     Breath sounds: Normal breath sounds.  Abdominal:     General: Abdomen is flat.     Palpations: Abdomen is soft.  Musculoskeletal:        General: Normal range of motion.  Skin:    General: Skin is warm and dry.  Neurological:     General: No focal deficit present.     Mental Status: She is alert and oriented to person, place, and time.  Psychiatric:  Mood and Affect: Mood normal.     Lab Results Lab Results  Component Value Date   WBC 7.2 10/19/2023   HGB 10.2 (L) 10/19/2023   HCT 32.3 (L) 10/19/2023   MCV 87.5 10/19/2023   PLT 180 10/19/2023    Lab Results  Component Value Date   CREATININE 1.02 (H) 10/20/2023   BUN 12 10/20/2023   NA 137 10/20/2023   K 4.3 10/20/2023   CL 96 (L) 10/20/2023   CO2 29 10/20/2023    Lab Results  Component Value Date   ALT 11 10/18/2023   AST 14 (L) 10/18/2023   GGT 173 (H) 11/21/2019   ALKPHOS 47 10/18/2023   BILITOT 0.8 10/18/2023       Loney Stank, MD [facility] 10/20/2023, 1:03 PM I have personally spent 82 minutes involved in face-to-face and non-face-to-face activities for this patient on the day of the visit. Professional time spent includes the following activities: Preparing to see the patient (review of tests), Obtaining and/or reviewing separately obtained history (admission/discharge record), Performing a medically appropriate examination and/or evaluation , Ordering medications/tests/procedures, referring and communicating with other health care professionals, Documenting clinical information in the EMR, Independently interpreting results (not separately reported), Communicating results to the patient/family/caregiver, Counseling and  educating the patient/family/caregiver and Care coordination (not separately reported).

## 2023-10-20 NOTE — Plan of Care (Signed)

## 2023-10-20 NOTE — Plan of Care (Addendum)
 Patient AO X 4, VSS, remains on contact precaution. Some right leg pain treated with PRN tylenol . IV antibiotics adjusted today. Up to [facility].    Problem: Education: Goal: Ability to describe self-care measures that may prevent or decrease complications (Diabetes Survival Skills Education) will improve Outcome: Progressing Goal: Individualized Educational Video(s) Outcome: Progressing   Problem: Coping: Goal: Ability to adjust to condition or change in health will improve Outcome: Progressing   Problem: Fluid Volume: Goal: Ability to maintain a balanced intake and output will improve Outcome: Progressing   Problem: Health Behavior/Discharge Planning: Goal: Ability to identify and utilize available resources and services will improve Outcome: Progressing Goal: Ability to manage health-related needs will improve Outcome: Progressing   Problem: Metabolic: Goal: Ability to maintain appropriate glucose levels will improve Outcome: Progressing   Problem: Nutritional: Goal: Maintenance of adequate nutrition will improve Outcome: Progressing Goal: Progress toward achieving an optimal weight will improve Outcome: Progressing   Problem: Skin Integrity: Goal: Risk for impaired skin integrity will decrease Outcome: Progressing   Problem: Tissue Perfusion: Goal: Adequacy of tissue perfusion will improve Outcome: Progressing   Problem: Fluid Volume: Goal: Hemodynamic stability will improve Outcome: Progressing   Problem: Clinical Measurements: Goal: Diagnostic test results will improve Outcome: Progressing Goal: Signs and symptoms of infection will decrease Outcome: Progressing   Problem: Respiratory: Goal: Ability to maintain adequate ventilation will improve Outcome: Progressing   Problem: Education: Goal: Knowledge of General Education information will improve Description: Including pain rating scale, medication(s)/side effects and non-pharmacologic comfort  measures Outcome: Progressing   Problem: Health Behavior/Discharge Planning: Goal: Ability to manage health-related needs will improve Outcome: Progressing   Problem: Clinical Measurements: Goal: Ability to maintain clinical measurements within normal limits will improve Outcome: Progressing Goal: Will remain free from infection Outcome: Progressing Goal: Diagnostic test results will improve Outcome: Progressing Goal: Respiratory complications will improve Outcome: Progressing Goal: Cardiovascular complication will be avoided Outcome: Progressing   Problem: Activity: Goal: Risk for activity intolerance will decrease Outcome: Progressing   Problem: Nutrition: Goal: Adequate nutrition will be maintained Outcome: Progressing   Problem: Coping: Goal: Level of anxiety will decrease Outcome: Progressing   Problem: Elimination: Goal: Will not experience complications related to bowel motility Outcome: Progressing Goal: Will not experience complications related to urinary retention Outcome: Progressing   Problem: Pain Management: Goal: General experience of comfort will improve Outcome: Progressing   Problem: Safety: Goal: Ability to remain free from injury will improve Outcome: Progressing   Problem: Skin Integrity: Goal: Risk for impaired skin integrity will decrease Outcome: Progressing

## 2023-10-20 NOTE — Progress Notes (Signed)
 2D Echocardiogram has been performed.  Karla Hoover Delker 10/20/2023, 2:49 PM

## 2023-10-20 NOTE — Progress Notes (Signed)
 PHARMACY - PHYSICIAN COMMUNICATION CRITICAL VALUE ALERT - BLOOD CULTURE IDENTIFICATION (BCID)  Karla Hoover is an 53 y.o. female who presented to [facility] on 10/16/2023 with a chief complaint of N/V, fever, HA, body aches, dysuria, suprapubic and bilateral flank pain.  Assessment: 46 YOF being covered empirically for concern for urinary source of infection now found to have 1 blood culture set growing candida glabrata and staph hominis which will require further evaluation.   Name of physician (or Provider) Contacted: Cheryle + Dennise (automatic ID consult)  Current antibiotics: Micafungin   Changes to prescribed antibiotics recommended:  - Continue Micafungin  100 mg IV every 24 hours - Meropenem  was already discontinued earlier today  Results for orders placed or performed during the hospital encounter of 10/16/23  Blood Culture ID Panel (Reflexed) (Collected: 10/17/2023  1:18 AM)  Result Value Ref Range   Enterococcus faecalis NOT DETECTED NOT DETECTED   Enterococcus Faecium NOT DETECTED NOT DETECTED   Listeria monocytogenes NOT DETECTED NOT DETECTED   Staphylococcus species NOT DETECTED NOT DETECTED   Staphylococcus aureus (BCID) NOT DETECTED NOT DETECTED   Staphylococcus epidermidis NOT DETECTED NOT DETECTED   Staphylococcus lugdunensis NOT DETECTED NOT DETECTED   Streptococcus species NOT DETECTED NOT DETECTED   Streptococcus agalactiae NOT DETECTED NOT DETECTED   Streptococcus pneumoniae NOT DETECTED NOT DETECTED   Streptococcus pyogenes NOT DETECTED NOT DETECTED   A.calcoaceticus-baumannii NOT DETECTED NOT DETECTED   Bacteroides fragilis NOT DETECTED NOT DETECTED   Enterobacterales NOT DETECTED NOT DETECTED   Enterobacter cloacae complex NOT DETECTED NOT DETECTED   Escherichia coli NOT DETECTED NOT DETECTED   Klebsiella aerogenes NOT DETECTED NOT DETECTED   Klebsiella oxytoca NOT DETECTED NOT DETECTED   Klebsiella pneumoniae NOT DETECTED NOT DETECTED   Proteus species NOT  DETECTED NOT DETECTED   Salmonella species NOT DETECTED NOT DETECTED   Serratia marcescens NOT DETECTED NOT DETECTED   Haemophilus influenzae NOT DETECTED NOT DETECTED   Neisseria meningitidis NOT DETECTED NOT DETECTED   Pseudomonas aeruginosa NOT DETECTED NOT DETECTED   Stenotrophomonas maltophilia NOT DETECTED NOT DETECTED   Candida albicans NOT DETECTED NOT DETECTED   Candida auris NOT DETECTED NOT DETECTED   Candida glabrata DETECTED (A) NOT DETECTED   Candida krusei NOT DETECTED NOT DETECTED   Candida parapsilosis NOT DETECTED NOT DETECTED   Candida tropicalis NOT DETECTED NOT DETECTED   Cryptococcus neoformans/gattii NOT DETECTED NOT DETECTED    Thank you for allowing pharmacy to be a part of this patient's care.  Almarie Lunger, PharmD, BCPS, BCIDP Infectious Diseases Clinical Pharmacist 10/20/2023 11:45 AM   **Pharmacist phone directory can now be found on [email or web address] ([email or web address]).  Listed under [facility].

## 2023-10-20 NOTE — TOC CM/SW Note (Signed)
 Transition of Care [facility]) - Inpatient Brief Assessment   Patient Details  Name: Karla Hoover MRN: [id number] Date of Birth: Mar 02, 1971  Transition of Care [facility]) CM/SW Contact:    Tawni CHRISTELLA Eva, LCSW Phone Number: 10/20/2023, 2:58 PM   Clinical Narrative:  Pt was provided with [facility] PCP list , no SDOH concerns.  Transition of Care Asessment: Insurance and Status: Insurance coverage has been reviewed Patient has primary care physician: No (provided List of [facility] providers) Home environment has been reviewed: home with self Prior level of function:: indepednent Prior/Current Home Services: No current home services Social Drivers of Health Review: SDOH reviewed no interventions necessary Readmission risk has been reviewed: Yes Transition of care needs: no transition of care needs at this time

## 2023-10-20 NOTE — Progress Notes (Signed)
 PROGRESS NOTE    Karla Hoover  FMW:[id number] DOB: [birth year] DOA: 10/16/2023 PCP: Pcp, No   Brief Narrative:  53 y.o. female with medical history significant of asthma, seasonal allergies, stage 3a CKD, bipolar disorder, class I obesity, tobacco abuse, COPD with chronic bronchitis and chronic respiratory failure with hypoxia, type II DM, hypertension, GERD, grade 1 diastolic dysfunction, hypothyroidism, hyperlipidemia, bilateral pulmonary embolism, DVT, history of other non-hemorrhagic stroke with residual left-sided hemiparesis and recent admission last month for ESBL E. coli and Klebsiella UTI treated with antibiotics presented with nausea, vomiting, fever, dysuria and suprapubic/flank pain.  On presentation, UA was suggestive of UTI; WBCs of 17, creatinine of 1.44 and lactic acid of 2.2.  Chest x-ray showed no active disease.  CT renal stone study showed no acute findings in the abdomen or pelvis with no nephrolithiasis or obstructive uropathy.  She was started on IV fluids and broad-spectrum antibiotics.  Assessment & Plan:   Sepsis: Present on admission UTI: Present on admission Lactic acidosis: Resolved -Presented with fever, lactic acidosis, leukocytosis with possible UTI -Had ESBL E. coli during last admission.  Continue meropenem .  Urine cultures had insignificant growth.  Blood culture growing Staph hominis in 1 bottle: Possibly contaminant -Currently afebrile and hemodynamically stable.  Leukocytosis -Resolved  Diabetes mellitus type 2 with hyperglycemia Peripheral neuropathy -Carb modified diet.  Continue long-acting insulin  along with CBGs with SSI.  Continue gabapentin   Chronic respiratory failure with hypoxia COPD -Stable.  Continue supplemental oxygen .  Continue current inhaled therapy.  Outpatient follow-up with pulmonary  Essential hypertension--losartan  on hold.  Will resume losartan  since blood pressure is trending upwards.  Continue atenolol   History of  unspecified CVA with left-sided hemiparesis Hyperlipidemia -Continue supportive care.  Outpatient follow-up with neurology.  Continue ezetimibe /statin.  PT eval  History of PE/DVT -Continue Eliquis   Grade 1 diastolic dysfunction -Continue beta-blocker.  Losartan  plan as above.  CKD stage IIIa -Creatinine currently stable.  Monitor  Anxiety/depression -Continue fluoxetine   Prolonged QT -Monitor on telemetry.  Avoid QT prolonging meds as much as possible  Obesity -Outpatient follow-up    DVT prophylaxis:  Eliquis  Code Status: Full Family Communication: None at bedside Disposition Plan: Status is: Inpatient Remains inpatient appropriate because: Of severity of illness    Consultants: None  Procedures: None  Antimicrobials:  Anti-infectives (From admission, onward)    Start     Dose/Rate Route Frequency Ordered Stop   10/18/23 0600  cefTRIAXone  (ROCEPHIN ) 2 g in sodium chloride  0.9 % 100 mL IVPB  Status:  Discontinued        2 g 200 mL/hr over 30 Minutes Intravenous Every 24 hours 10/17/23 0747 10/17/23 0902   10/17/23 0900  meropenem  (MERREM ) 1 g in sodium chloride  0.9 % 100 mL IVPB        1 g 200 mL/hr over 30 Minutes Intravenous Every 8 hours 10/17/23 0858     10/17/23 0145  cefTRIAXone  (ROCEPHIN ) 2 g in sodium chloride  0.9 % 100 mL IVPB        2 g 200 mL/hr over 30 Minutes Intravenous Once 10/17/23 0132 10/17/23 0305      Subjective: Patient seen and examined at bedside.  No fever, vomiting, chest pain reported. Objective: Vitals:   10/19/23 0920 10/19/23 1241 10/19/23 2125 10/20/23 0400  BP:  (!) 148/72 (!) 168/91 (!) 158/78  Pulse:  76 86 85  Resp:  18 18 19   Temp:  98.3 F (36.8 C) 98.5 F (36.9 C) 99.8 F (37.7 C)  TempSrc:  Oral Oral Oral  SpO2: 94% 90% 91% 92%  Weight:      Height:        Intake/Output Summary (Last 24 hours) at 10/20/2023 0742 Last data filed at 10/20/2023 0401 Gross per 24 hour  Intake 480 ml  Output 2000 ml  Net -1520  ml   Filed Weights   10/17/23 1628  Weight: 94 kg    Examination:  General: No acute distress.  Remains on room air.  Looks chronically ill and deconditioned. ENT/neck: No elevation or palpable neck masses noted respiratory: Bilateral decreased breath sounds at bases with scattered crackles CVS: Rate mostly controlled; S1 and S2 are heard Abdominal: Soft, obese, nontender, distended mild; no organomegaly, bowel sounds are heard normally Extremities: No clubbing; mild lower extremity edema present  CNS: Alert and oriented.  Left-sided weakness present.   Lymph: No obvious palpable lymphadenopathy Skin: No obvious petechia/lesions psych: No signs of agitation.  Flat affect mostly Musculoskeletal: No obvious joint tenderness/erythema  Data Reviewed: I have personally reviewed following labs and imaging studies  CBC: Recent Labs  Lab 10/17/23 0107 10/17/23 1948 10/18/23 0521 10/19/23 0448  WBC 17.0*  --  10.1 7.2  NEUTROABS  --   --  6.2 3.8  HGB 13.2 11.4* 10.7* 10.2*  HCT 41.1 34.8* 34.6* 32.3*  MCV 86.3  --  88.7 87.5  PLT 232  --  166 180   Basic Metabolic Panel: Recent Labs  Lab 10/17/23 0107 10/18/23 0521 10/19/23 0448 10/20/23 0422  NA 130* 136 137 137  K 5.0 4.3 4.2 4.3  CL 96* 100 100 96*  CO2 25 28 28 29   GLUCOSE 271* 163* 181* 152*  BUN 25* 21* 15 12  CREATININE 1.44* 1.41* 1.10* 1.02*  CALCIUM  8.9 8.7* 8.8* 9.4  MG  --   --  1.9 1.8   GFR: Estimated Creatinine Clearance: 77.6 mL/min (A) (by C-G formula based on SCr of 1.02 mg/dL (H)). Liver Function Tests: Recent Labs  Lab 10/17/23 0107 10/18/23 0521  AST 22 14*  ALT 14 11  ALKPHOS 64 47  BILITOT 0.7 0.8  PROT 7.6 5.9*  ALBUMIN 4.0 2.6*   Recent Labs  Lab 10/17/23 0107  LIPASE 22   No results for input(s): AMMONIA in the last 168 hours. Coagulation Profile: Recent Labs  Lab 10/17/23 0107  INR 1.6*   Cardiac Enzymes: No results for input(s): CKTOTAL, CKMB, CKMBINDEX,  TROPONINI in the last 168 hours. BNP (last 3 results) No results for input(s): PROBNP in the last 8760 hours. HbA1C: No results for input(s): HGBA1C in the last 72 hours. CBG: Recent Labs  Lab 10/18/23 2129 10/19/23 0745 10/19/23 1116 10/19/23 1651 10/19/23 2122  GLUCAP 165* 171* 148* 183* 154*   Lipid Profile: No results for input(s): CHOL, HDL, LDLCALC, TRIG, CHOLHDL, LDLDIRECT in the last 72 hours. Thyroid Function Tests: No results for input(s): TSH, T4TOTAL, FREET4, T3FREE, THYROIDAB in the last 72 hours. Anemia Panel: No results for input(s): VITAMINB12, FOLATE, FERRITIN, TIBC, IRON, RETICCTPCT in the last 72 hours. Sepsis Labs: Recent Labs  Lab 10/17/23 0125 10/17/23 0302 10/17/23 2230  LATICACIDVEN 2.2* 2.2* 0.9    Recent Results (from the past 240 hours)  Resp panel by RT-PCR (RSV, Flu A&B, Covid) Anterior Nasal Swab     Status: None   Collection Time: 10/16/23 11:17 PM   Specimen: Anterior Nasal Swab  Result Value Ref Range Status   SARS Coronavirus 2 by RT PCR NEGATIVE NEGATIVE Final  Comment: (NOTE) SARS-CoV-2 target nucleic acids are NOT DETECTED.  The SARS-CoV-2 RNA is generally detectable in upper respiratory specimens during the acute phase of infection. The lowest concentration of SARS-CoV-2 viral copies this assay can detect is 138 copies/mL. A negative result does not preclude SARS-Cov-2 infection and should not be used as the sole basis for treatment or other patient management decisions. A negative result may occur with  improper specimen collection/handling, submission of specimen other than nasopharyngeal swab, presence of viral mutation(s) within the areas targeted by this assay, and inadequate number of viral copies(<138 copies/mL). A negative result must be combined with clinical observations, patient history, and epidemiological information. The expected result is Negative.  Fact Sheet for  Patients:  bloggercourse.com  Fact Sheet for Healthcare Providers:  seriousbroker.it  This test is no t yet approved or cleared by the [place]  FDA and  has been authorized for detection and/or diagnosis of SARS-CoV-2 by FDA under an Emergency Use Authorization (EUA). This EUA will remain  in effect (meaning this test can be used) for the duration of the COVID-19 declaration under Section 564(b)(1) of the Act, 21 U.S.C.section 360bbb-3(b)(1), unless the authorization is terminated  or revoked sooner.       Influenza A by PCR NEGATIVE NEGATIVE Final   Influenza B by PCR NEGATIVE NEGATIVE Final    Comment: (NOTE) The Xpert Xpress SARS-CoV-2/FLU/RSV plus assay is intended as an aid in the diagnosis of influenza from Nasopharyngeal swab specimens and should not be used as a sole basis for treatment. Nasal washings and aspirates are unacceptable for Xpert Xpress SARS-CoV-2/FLU/RSV testing.  Fact Sheet for Patients: bloggercourse.com  Fact Sheet for Healthcare Providers: seriousbroker.it  This test is not yet approved or cleared by the [place]  FDA and has been authorized for detection and/or diagnosis of SARS-CoV-2 by FDA under an Emergency Use Authorization (EUA). This EUA will remain in effect (meaning this test can be used) for the duration of the COVID-19 declaration under Section 564(b)(1) of the Act, 21 U.S.C. section 360bbb-3(b)(1), unless the authorization is terminated or revoked.     Resp Syncytial Virus by PCR NEGATIVE NEGATIVE Final    Comment: (NOTE) Fact Sheet for Patients: bloggercourse.com  Fact Sheet for Healthcare Providers: seriousbroker.it  This test is not yet approved or cleared by the [place]  FDA and has been authorized for detection and/or diagnosis of SARS-CoV-2 by FDA under an Emergency  Use Authorization (EUA). This EUA will remain in effect (meaning this test can be used) for the duration of the COVID-19 declaration under Section 564(b)(1) of the Act, 21 U.S.C. section 360bbb-3(b)(1), unless the authorization is terminated or revoked.  Performed at [facility], [address], [place], [place] [address]   Urine Culture     Status: Abnormal   Collection Time: 10/17/23  1:07 AM   Specimen: Urine, Random  Result Value Ref Range Status   Specimen Description   Final    URINE, RANDOM Performed at [facility], [address], [place], [place] [address]    Special Requests   Final    NONE Reflexed from [id number] Performed at [facility], [address], [place], [place] [address]    Culture (A)  Final    <10,000 COLONIES/mL INSIGNIFICANT GROWTH Performed at [facility], [address] [place], [place] [address]    Report Status 10/18/2023 FINAL  Final  Blood Culture (routine x 2)     Status: Abnormal  Collection Time: 10/17/23  1:18 AM   Specimen: BLOOD LEFT FOREARM  Result Value Ref Range Status   Specimen Description   Final    BLOOD LEFT FOREARM Performed at [facility], [address], [place], [place] [address]    Special Requests   Final    BOTTLES DRAWN AEROBIC AND ANAEROBIC Blood Culture adequate volume Performed at [facility], [address], [place], [place] [address]    Culture  Setup Time   Final    GRAM POSITIVE COCCI ANAEROBIC BOTTLE ONLY CRITICAL RESULT CALLED TO, READ BACK BY AND VERIFIED WITH: PHARMD E JACKSON 10/18/2023 @ 0003 BY AB    Culture (A)  Final    STAPHYLOCOCCUS HOMINIS THE SIGNIFICANCE OF ISOLATING THIS ORGANISM FROM A SINGLE SET OF BLOOD CULTURES WHEN MULTIPLE SETS ARE DRAWN IS UNCERTAIN. PLEASE NOTIFY THE MICROBIOLOGY DEPARTMENT WITHIN ONE WEEK IF SPECIATION AND SENSITIVITIES ARE REQUIRED. Performed at [facility], [address], [place], [place] [address]    Report Status 10/19/2023 FINAL  Final  Blood Culture (routine x 2)     Status: None (Preliminary result)   Collection Time: 10/17/23  1:18 AM   Specimen: BLOOD RIGHT FOREARM  Result Value Ref Range Status   Specimen Description   Final    BLOOD RIGHT FOREARM Performed at [facility], [address], [place], [place] [address]    Special Requests   Final    BOTTLES DRAWN AEROBIC AND ANAEROBIC Blood Culture adequate volume Performed at [facility], [address] [place], [place] [address]    Culture   Final    NO GROWTH 3 DAYS Performed at [facility], [address], [place], [place] [address]    Report Status PENDING  Incomplete  Blood Culture ID Panel (Reflexed)     Status: Abnormal   Collection Time: 10/17/23  1:18 AM  Result Value Ref Range Status   Enterococcus faecalis NOT DETECTED NOT DETECTED Final   Enterococcus Faecium NOT DETECTED NOT DETECTED Final   Listeria monocytogenes NOT DETECTED NOT DETECTED Final   Staphylococcus species DETECTED (A) NOT DETECTED Final    Comment: CRITICAL RESULT CALLED TO, READ BACK BY AND VERIFIED WITH: PHARMD E JACKSON 10/18/2023 @ 0003 BY AB    Staphylococcus aureus (BCID) NOT DETECTED NOT DETECTED Final   Staphylococcus epidermidis NOT DETECTED NOT DETECTED Final   Staphylococcus lugdunensis NOT DETECTED NOT DETECTED Final   Streptococcus species NOT DETECTED NOT DETECTED Final   Streptococcus agalactiae NOT DETECTED NOT DETECTED Final   Streptococcus pneumoniae NOT DETECTED NOT DETECTED Final   Streptococcus pyogenes NOT DETECTED NOT DETECTED Final   A.calcoaceticus-baumannii NOT DETECTED NOT DETECTED Final   Bacteroides fragilis NOT DETECTED NOT DETECTED Final   Enterobacterales NOT DETECTED NOT DETECTED Final   Enterobacter cloacae complex NOT DETECTED NOT DETECTED Final   Escherichia coli NOT DETECTED NOT DETECTED Final   Klebsiella  aerogenes NOT DETECTED NOT DETECTED Final   Klebsiella oxytoca NOT DETECTED NOT DETECTED Final   Klebsiella pneumoniae NOT DETECTED NOT DETECTED Final   Proteus species NOT DETECTED NOT DETECTED Final   Salmonella species NOT DETECTED NOT DETECTED Final   Serratia marcescens NOT DETECTED NOT DETECTED Final   Haemophilus influenzae NOT DETECTED NOT DETECTED Final   Neisseria meningitidis NOT DETECTED NOT DETECTED Final   Pseudomonas aeruginosa NOT DETECTED NOT DETECTED Final   Stenotrophomonas maltophilia NOT DETECTED NOT DETECTED Final   Candida albicans NOT  DETECTED NOT DETECTED Final   Candida auris NOT DETECTED NOT DETECTED Final   Candida glabrata NOT DETECTED NOT DETECTED Final   Candida krusei NOT DETECTED NOT DETECTED Final   Candida parapsilosis NOT DETECTED NOT DETECTED Final   Candida tropicalis NOT DETECTED NOT DETECTED Final   Cryptococcus neoformans/gattii NOT DETECTED NOT DETECTED Final    Comment: Performed at [facility], [address], [place], [place] [address]         Radiology Studies: No results found.       Scheduled Meds:  apixaban   5 mg Oral BID   atenolol   50 mg Oral BID   atorvastatin   80 mg Oral QHS   ezetimibe   10 mg Oral QHS   FLUoxetine   20 mg Oral Daily   gabapentin   400 mg Oral TID   insulin  aspart  0-15 Units Subcutaneous TID WC   insulin  glargine-yfgn  10 Units Subcutaneous QHS   levothyroxine   88 mcg Oral Q0600   magnesium  oxide  400 mg Oral QODAY   melatonin  5 mg Oral QHS   mometasone -formoterol   2 puff Inhalation BID   pantoprazole   20 mg Oral Daily   QUEtiapine   25 mg Oral QHS   rOPINIRole   0.25 mg Oral QHS   umeclidinium bromide   1 puff Inhalation Daily   Continuous Infusions:  meropenem  (MERREM ) IV 1 g (10/20/23 0629)          Sophie Mao, MD [facility] 10/20/2023, 7:42 AM

## 2023-10-21 ENCOUNTER — Inpatient Hospital Stay (HOSPITAL_COMMUNITY): Payer: Medicaid Other

## 2023-10-21 DIAGNOSIS — N390 Urinary tract infection, site not specified: Secondary | ICD-10-CM | POA: Diagnosis not present

## 2023-10-21 DIAGNOSIS — A419 Sepsis, unspecified organism: Secondary | ICD-10-CM | POA: Diagnosis not present

## 2023-10-21 LAB — GLUCOSE, CAPILLARY
Glucose-Capillary: 147 mg/dL — ABNORMAL HIGH (ref 70–99)
Glucose-Capillary: 147 mg/dL — ABNORMAL HIGH (ref 70–99)
Glucose-Capillary: 149 mg/dL — ABNORMAL HIGH (ref 70–99)
Glucose-Capillary: 172 mg/dL — ABNORMAL HIGH (ref 70–99)

## 2023-10-21 MED ORDER — BISACODYL 10 MG RE SUPP: 10.0000 mg | Freq: Every day | RECTAL | Status: DC | PRN

## 2023-10-21 MED ORDER — TRAMADOL HCL 50 MG PO TABS
50.0000 mg | ORAL_TABLET | Freq: Four times a day (QID) | ORAL | Status: DC | PRN
  Administered 2023-10-21 – 2023-10-24 (×8): 50 mg via ORAL
  Filled 2023-10-21 (×8): qty 1

## 2023-10-21 MED ORDER — SENNOSIDES-DOCUSATE SODIUM 8.6-50 MG PO TABS
1.0000 | ORAL_TABLET | Freq: Two times a day (BID) | ORAL | Status: DC
  Administered 2023-10-21 – 2023-10-24 (×8): 1 via ORAL
  Filled 2023-10-21 (×8): qty 1

## 2023-10-21 MED ORDER — POLYETHYLENE GLYCOL 3350 17 G PO PACK: 17.0000 g | PACK | Freq: Every day | ORAL | Status: DC | PRN

## 2023-10-21 MED ORDER — LORAZEPAM 0.5 MG PO TABS
0.5000 mg | ORAL_TABLET | Freq: Once | ORAL | Status: AC
  Administered 2023-10-21: 0.5 mg via ORAL
  Filled 2023-10-21: qty 1

## 2023-10-21 MED ORDER — GADOBUTROL 1 MMOL/ML IV SOLN
9.0000 mL | Freq: Once | INTRAVENOUS | Status: AC | PRN
  Administered 2023-10-21: 9 mL via INTRAVENOUS

## 2023-10-21 NOTE — Plan of Care (Signed)
  Problem: Education: Goal: Ability to describe self-care measures that may prevent or decrease complications (Diabetes Survival Skills Education) will improve Outcome: Progressing   Problem: Coping: Goal: Ability to adjust to condition or change in health will improve Outcome: Progressing   Problem: Nutritional: Goal: Maintenance of adequate nutrition will improve Outcome: Progressing   Problem: Skin Integrity: Goal: Risk for impaired skin integrity will decrease Outcome: Progressing   Problem: Respiratory: Goal: Ability to maintain adequate ventilation will improve Outcome: Progressing

## 2023-10-21 NOTE — Progress Notes (Signed)
 PROGRESS NOTE    Karla Hoover  FMW:[id number] DOB: 1971-03-04 DOA: 10/16/2023 PCP: Pcp, No   Brief Narrative:  52 y.o. female with medical history significant of asthma, seasonal allergies, stage 3a CKD, bipolar disorder, class I obesity, tobacco abuse, COPD with chronic bronchitis and chronic respiratory failure with hypoxia, type II DM, hypertension, GERD, grade 1 diastolic dysfunction, hypothyroidism, hyperlipidemia, bilateral pulmonary embolism, DVT, history of other non-hemorrhagic stroke with residual left-sided hemiparesis and recent admission last month for ESBL E. coli and Klebsiella UTI treated with antibiotics presented with nausea, vomiting, fever, dysuria and suprapubic/flank pain.  On presentation, UA was suggestive of UTI; WBCs of 17, creatinine of 1.44 and lactic acid of 2.2.  Chest x-ray showed no active disease.  CT renal stone study showed no acute findings in the abdomen or pelvis with no nephrolithiasis or obstructive uropathy.  She was started on IV fluids and broad-spectrum antibiotics.  Blood cultures turned positive for Candida glabrata on 10/20/2023: ID was auto consulted; meropenem  discontinued and Mycamine  was started.  Assessment & Plan:   Sepsis: Present on admission UTI: Present on admission Lactic acidosis: Resolved Fungemia -Presented with fever, lactic acidosis, leukocytosis with possible UTI -Had ESBL E. coli during last admission.  Initially started on meropenem .  Urine cultures had insignificant growth.  Blood culture growing Staph hominis in 1 bottle: Possibly contaminant; blood cultures turned positive for Candida glabrata on 10/20/2023: ID was auto consulted; meropenem  discontinued and Mycamine  was started.  Follow sensitivities.  Follow repeat blood cultures from 10/20/2023.  2D echo pending.  CT of abdomen and pelvis with contrast done on 10/20/2023 showed right renal mass concerning for malignancy.  Will check MRI renal protocol. -Currently afebrile and  hemodynamically stable.  Leukocytosis -Resolved  Diabetes mellitus type 2 with hyperglycemia Peripheral neuropathy -Carb modified diet.  Continue long-acting insulin  along with CBGs with SSI.  Continue gabapentin   Chronic respiratory failure with hypoxia COPD -Stable.  Continue supplemental oxygen .  Continue current inhaled therapy.  Outpatient follow-up with pulmonary  Essential hypertension--blood pressure intermittently elevated.  Continue losartan  and atenolol   History of unspecified CVA with left-sided hemiparesis Hyperlipidemia -Continue supportive care.  Outpatient follow-up with neurology.  Continue ezetimibe /statin.  PT recommended no PT follow-up  History of PE/DVT -Continue Eliquis   Grade 1 diastolic dysfunction -Continue beta-blocker and losartan    CKD stage IIIa -Creatinine currently stable.  Monitor  Anxiety/depression -Continue fluoxetine   Prolonged QT -Monitor on telemetry.  Avoid QT prolonging meds as much as possible  Obesity -Outpatient follow-up    DVT prophylaxis:  Eliquis  Code Status: Full Family Communication: None at bedside Disposition Plan: Status is: Inpatient Remains inpatient appropriate because: Of severity of illness    Consultants: ID  Procedures: None  Antimicrobials:  Anti-infectives (From admission, onward)    Start     Dose/Rate Route Frequency Ordered Stop   10/20/23 2200  nitrofurantoin  (macrocrystal-monohydrate) (MACROBID ) capsule 100 mg        100 mg Oral Daily at bedtime 10/20/23 1340     10/20/23 1115  micafungin  (MYCAMINE ) 100 mg in sodium chloride  0.9 % 100 mL IVPB        100 mg 105 mL/hr over 1 Hours Intravenous Daily 10/20/23 1020     10/18/23 0600  cefTRIAXone  (ROCEPHIN ) 2 g in sodium chloride  0.9 % 100 mL IVPB  Status:  Discontinued        2 g 200 mL/hr over 30 Minutes Intravenous Every 24 hours 10/17/23 0747 10/17/23 0902   10/17/23 0900  meropenem  (  MERREM ) 1 g in sodium chloride  0.9 % 100 mL IVPB   Status:  Discontinued        1 g 200 mL/hr over 30 Minutes Intravenous Every 8 hours 10/17/23 0858 10/20/23 1043   10/17/23 0145  cefTRIAXone  (ROCEPHIN ) 2 g in sodium chloride  0.9 % 100 mL IVPB        2 g 200 mL/hr over 30 Minutes Intravenous Once 10/17/23 0132 10/17/23 0305      Subjective: Patient seen and examined at bedside.  Denies worsening shortness of breath, chest pain, fever or vomiting.   Objective: Vitals:   10/20/23 0400 10/20/23 1938 10/20/23 2111 10/21/23 0447  BP: (!) 158/78  (!) 158/101 (!) 142/79  Pulse: 85  78 66  Resp: 19  20 18   Temp: 99.8 F (37.7 C)  98.5 F (36.9 C) 97.8 F (36.6 C)  TempSrc: Oral  Oral Oral  SpO2: 92% 95% 96% 94%  Weight:      Height:        Intake/Output Summary (Last 24 hours) at 10/21/2023 0739 Last data filed at 10/21/2023 0451 Gross per 24 hour  Intake 1400.07 ml  Output 1250 ml  Net 150.07 ml   Filed Weights   10/17/23 1628  Weight: 94 kg    Examination:  General: Currently on room air.  No distress.  Looks chronically ill and deconditioned. ENT/neck: No obvious JVD elevation or palpable thyromegaly noted  respiratory: Decreased breath sounds at bases bilaterally with some crackles  CVS: S1-S2 heard; currently rate controlled  abdominal: Soft, obese, nontender, distended slightly; no organomegaly, normal bowel sounds heard Extremities: No cyanosis; trace lower extremity edema present CNS: Awake and oriented.  Left-sided weakness is present.   Lymph: No lymphadenopathy palpable Skin: No obvious rashes/ecchymosis psych: Mostly flat affect.  Showing no signs of agitation musculoskeletal: No obvious joint swelling/tenderness  Data Reviewed: I have personally reviewed following labs and imaging studies  CBC: Recent Labs  Lab 10/17/23 0107 10/17/23 1948 10/18/23 0521 10/19/23 0448  WBC 17.0*  --  10.1 7.2  NEUTROABS  --   --  6.2 3.8  HGB 13.2 11.4* 10.7* 10.2*  HCT 41.1 34.8* 34.6* 32.3*  MCV 86.3  --  88.7 87.5   PLT 232  --  166 180   Basic Metabolic Panel: Recent Labs  Lab 10/17/23 0107 10/18/23 0521 10/19/23 0448 10/20/23 0422  NA 130* 136 137 137  K 5.0 4.3 4.2 4.3  CL 96* 100 100 96*  CO2 25 28 28 29   GLUCOSE 271* 163* 181* 152*  BUN 25* 21* 15 12  CREATININE 1.44* 1.41* 1.10* 1.02*  CALCIUM  8.9 8.7* 8.8* 9.4  MG  --   --  1.9 1.8   GFR: Estimated Creatinine Clearance: 77.6 mL/min (A) (by C-G formula based on SCr of 1.02 mg/dL (H)). Liver Function Tests: Recent Labs  Lab 10/17/23 0107 10/18/23 0521  AST 22 14*  ALT 14 11  ALKPHOS 64 47  BILITOT 0.7 0.8  PROT 7.6 5.9*  ALBUMIN 4.0 2.6*   Recent Labs  Lab 10/17/23 0107  LIPASE 22   No results for input(s): AMMONIA in the last 168 hours. Coagulation Profile: Recent Labs  Lab 10/17/23 0107  INR 1.6*   Cardiac Enzymes: No results for input(s): CKTOTAL, CKMB, CKMBINDEX, TROPONINI in the last 168 hours. BNP (last 3 results) No results for input(s): PROBNP in the last 8760 hours. HbA1C: No results for input(s): HGBA1C in the last 72 hours. CBG: Recent Labs  Lab 10/19/23 2122 10/20/23 0743 10/20/23 1134 10/20/23 1653 10/20/23 2103  GLUCAP 154* 155* 134* 178* 174*   Lipid Profile: No results for input(s): CHOL, HDL, LDLCALC, TRIG, CHOLHDL, LDLDIRECT in the last 72 hours. Thyroid Function Tests: No results for input(s): TSH, T4TOTAL, FREET4, T3FREE, THYROIDAB in the last 72 hours. Anemia Panel: No results for input(s): VITAMINB12, FOLATE, FERRITIN, TIBC, IRON, RETICCTPCT in the last 72 hours. Sepsis Labs: Recent Labs  Lab 10/17/23 0125 10/17/23 0302 10/17/23 2230  LATICACIDVEN 2.2* 2.2* 0.9    Recent Results (from the past 240 hours)  Resp panel by RT-PCR (RSV, Flu A&B, Covid) Anterior Nasal Swab     Status: None   Collection Time: 10/16/23 11:17 PM   Specimen: Anterior Nasal Swab  Result Value Ref Range Status   SARS Coronavirus 2 by RT PCR NEGATIVE  NEGATIVE Final    Comment: (NOTE) SARS-CoV-2 target nucleic acids are NOT DETECTED.  The SARS-CoV-2 RNA is generally detectable in upper respiratory specimens during the acute phase of infection. The lowest concentration of SARS-CoV-2 viral copies this assay can detect is 138 copies/mL. A negative result does not preclude SARS-Cov-2 infection and should not be used as the sole basis for treatment or other patient management decisions. A negative result may occur with  improper specimen collection/handling, submission of specimen other than nasopharyngeal swab, presence of viral mutation(s) within the areas targeted by this assay, and inadequate number of viral copies(<138 copies/mL). A negative result must be combined with clinical observations, patient history, and epidemiological information. The expected result is Negative.  Fact Sheet for Patients:  bloggercourse.com  Fact Sheet for Healthcare Providers:  seriousbroker.it  This test is no t yet approved or cleared by the [place]  FDA and  has been authorized for detection and/or diagnosis of SARS-CoV-2 by FDA under an Emergency Use Authorization (EUA). This EUA will remain  in effect (meaning this test can be used) for the duration of the COVID-19 declaration under Section 564(b)(1) of the Act, 21 U.S.C.section 360bbb-3(b)(1), unless the authorization is terminated  or revoked sooner.       Influenza A by PCR NEGATIVE NEGATIVE Final   Influenza B by PCR NEGATIVE NEGATIVE Final    Comment: (NOTE) The Xpert Xpress SARS-CoV-2/FLU/RSV plus assay is intended as an aid in the diagnosis of influenza from Nasopharyngeal swab specimens and should not be used as a sole basis for treatment. Nasal washings and aspirates are unacceptable for Xpert Xpress SARS-CoV-2/FLU/RSV testing.  Fact Sheet for Patients: bloggercourse.com  Fact Sheet for Healthcare  Providers: seriousbroker.it  This test is not yet approved or cleared by the [place]  FDA and has been authorized for detection and/or diagnosis of SARS-CoV-2 by FDA under an Emergency Use Authorization (EUA). This EUA will remain in effect (meaning this test can be used) for the duration of the COVID-19 declaration under Section 564(b)(1) of the Act, 21 U.S.C. section 360bbb-3(b)(1), unless the authorization is terminated or revoked.     Resp Syncytial Virus by PCR NEGATIVE NEGATIVE Final    Comment: (NOTE) Fact Sheet for Patients: bloggercourse.com  Fact Sheet for Healthcare Providers: seriousbroker.it  This test is not yet approved or cleared by the [place]  FDA and has been authorized for detection and/or diagnosis of SARS-CoV-2 by FDA under an Emergency Use Authorization (EUA). This EUA will remain in effect (meaning this test can be used) for the duration of the COVID-19 declaration under Section 564(b)(1) of the Act, 21 U.S.C. section 360bbb-3(b)(1), unless the authorization is  terminated or revoked.  Performed at [facility], [address] [place], [place] [address]   Urine Culture     Status: Abnormal   Collection Time: 10/17/23  1:07 AM   Specimen: Urine, Random  Result Value Ref Range Status   Specimen Description   Final    URINE, RANDOM Performed at [facility], [address] [place], [place] [address]    Special Requests   Final    NONE Reflexed from [id number] Performed at [facility], [address], [place], [place] [address]    Culture (A)  Final    <10,000 COLONIES/mL INSIGNIFICANT GROWTH Performed at [facility], [address] [place], [place] [address]    Report Status 10/18/2023 FINAL  Final  Blood Culture (routine x 2)     Status: Abnormal (Preliminary result)   Collection Time: 10/17/23  1:18  AM   Specimen: BLOOD LEFT FOREARM  Result Value Ref Range Status   Specimen Description   Final    BLOOD LEFT FOREARM Performed at [facility] [address] [place], [place] [address]    Special Requests   Final    BOTTLES DRAWN AEROBIC AND ANAEROBIC Blood Culture adequate volume Performed at [facility], [address] [place], [place] [address]    Culture  Setup Time   Final    GRAM POSITIVE COCCI ANAEROBIC BOTTLE ONLY CRITICAL RESULT CALLED TO, READ BACK BY AND VERIFIED WITH: PHARMD E JACKSON 10/18/2023 @ 0003 BY AB YEAST AEROBIC BOTTLE ONLY CRITICAL RESULT CALLED TO, READ BACK BY AND VERIFIED WITH: PHARMD C DAVIS [id number] AT 1137 BY CM    Culture (A)  Final    STAPHYLOCOCCUS HOMINIS THE SIGNIFICANCE OF ISOLATING THIS ORGANISM FROM A SINGLE SET OF BLOOD CULTURES WHEN MULTIPLE SETS ARE DRAWN IS UNCERTAIN. PLEASE NOTIFY THE MICROBIOLOGY DEPARTMENT WITHIN ONE WEEK IF SPECIATION AND SENSITIVITIES ARE REQUIRED. CANDIDA GLABRATA Sent to [facility] for further susceptibility testing. Performed at [facility], [address], [place], [place] [address]    Report Status PENDING  Incomplete  Blood Culture (routine x 2)     Status: None (Preliminary result)   Collection Time: 10/17/23  1:18 AM   Specimen: BLOOD RIGHT FOREARM  Result Value Ref Range Status   Specimen Description   Final    BLOOD RIGHT FOREARM Performed at [facility], [address], [place], [place] [address]    Special Requests   Final    BOTTLES DRAWN AEROBIC AND ANAEROBIC Blood Culture adequate volume Performed at [facility], [address], [place], [place] [address]    Culture   Final    NO GROWTH 4 DAYS Performed at [facility], [address] [place], [place] [address]    Report Status PENDING  Incomplete  Blood Culture ID Panel (Reflexed)     Status: Abnormal   Collection Time: 10/17/23  1:18 AM  Result Value  Ref Range Status   Enterococcus faecalis NOT DETECTED NOT DETECTED Final   Enterococcus Faecium NOT DETECTED NOT DETECTED Final   Listeria monocytogenes NOT DETECTED NOT DETECTED Final   Staphylococcus species DETECTED (A) NOT DETECTED Final    Comment: CRITICAL RESULT CALLED TO, READ BACK BY AND VERIFIED WITH: PHARMD E JACKSON 10/18/2023 @ 0003 BY AB    Staphylococcus aureus (BCID) NOT DETECTED NOT DETECTED Final   Staphylococcus epidermidis NOT DETECTED NOT DETECTED Final   Staphylococcus  lugdunensis NOT DETECTED NOT DETECTED Final   Streptococcus species NOT DETECTED NOT DETECTED Final   Streptococcus agalactiae NOT DETECTED NOT DETECTED Final   Streptococcus pneumoniae NOT DETECTED NOT DETECTED Final   Streptococcus pyogenes NOT DETECTED NOT DETECTED Final   A.calcoaceticus-baumannii NOT DETECTED NOT DETECTED Final   Bacteroides fragilis NOT DETECTED NOT DETECTED Final   Enterobacterales NOT DETECTED NOT DETECTED Final   Enterobacter cloacae complex NOT DETECTED NOT DETECTED Final   Escherichia coli NOT DETECTED NOT DETECTED Final   Klebsiella aerogenes NOT DETECTED NOT DETECTED Final   Klebsiella oxytoca NOT DETECTED NOT DETECTED Final   Klebsiella pneumoniae NOT DETECTED NOT DETECTED Final   Proteus species NOT DETECTED NOT DETECTED Final   Salmonella species NOT DETECTED NOT DETECTED Final   Serratia marcescens NOT DETECTED NOT DETECTED Final   Haemophilus influenzae NOT DETECTED NOT DETECTED Final   Neisseria meningitidis NOT DETECTED NOT DETECTED Final   Pseudomonas aeruginosa NOT DETECTED NOT DETECTED Final   Stenotrophomonas maltophilia NOT DETECTED NOT DETECTED Final   Candida albicans NOT DETECTED NOT DETECTED Final   Candida auris NOT DETECTED NOT DETECTED Final   Candida glabrata NOT DETECTED NOT DETECTED Final   Candida krusei NOT DETECTED NOT DETECTED Final   Candida parapsilosis NOT DETECTED NOT DETECTED Final   Candida tropicalis NOT DETECTED NOT DETECTED Final    Cryptococcus neoformans/gattii NOT DETECTED NOT DETECTED Final    Comment: Performed at [facility], [address], [place], [place] [address]  Blood Culture ID Panel (Reflexed)     Status: Abnormal   Collection Time: 10/17/23  1:18 AM  Result Value Ref Range Status   Enterococcus faecalis NOT DETECTED NOT DETECTED Final   Enterococcus Faecium NOT DETECTED NOT DETECTED Final   Listeria monocytogenes NOT DETECTED NOT DETECTED Final   Staphylococcus species NOT DETECTED NOT DETECTED Final   Staphylococcus aureus (BCID) NOT DETECTED NOT DETECTED Final   Staphylococcus epidermidis NOT DETECTED NOT DETECTED Final   Staphylococcus lugdunensis NOT DETECTED NOT DETECTED Final   Streptococcus species NOT DETECTED NOT DETECTED Final   Streptococcus agalactiae NOT DETECTED NOT DETECTED Final   Streptococcus pneumoniae NOT DETECTED NOT DETECTED Final   Streptococcus pyogenes NOT DETECTED NOT DETECTED Final   A.calcoaceticus-baumannii NOT DETECTED NOT DETECTED Final   Bacteroides fragilis NOT DETECTED NOT DETECTED Final   Enterobacterales NOT DETECTED NOT DETECTED Final   Enterobacter cloacae complex NOT DETECTED NOT DETECTED Final   Escherichia coli NOT DETECTED NOT DETECTED Final   Klebsiella aerogenes NOT DETECTED NOT DETECTED Final   Klebsiella oxytoca NOT DETECTED NOT DETECTED Final   Klebsiella pneumoniae NOT DETECTED NOT DETECTED Final   Proteus species NOT DETECTED NOT DETECTED Final   Salmonella species NOT DETECTED NOT DETECTED Final   Serratia marcescens NOT DETECTED NOT DETECTED Final   Haemophilus influenzae NOT DETECTED NOT DETECTED Final   Neisseria meningitidis NOT DETECTED NOT DETECTED Final   Pseudomonas aeruginosa NOT DETECTED NOT DETECTED Final   Stenotrophomonas maltophilia NOT DETECTED NOT DETECTED Final   Candida albicans NOT DETECTED NOT DETECTED Final   Candida auris NOT DETECTED NOT DETECTED Final   Candida glabrata DETECTED (A) NOT DETECTED Final    Comment:  CRITICAL RESULT CALLED TO, READ BACK BY AND VERIFIED WITH: PHARMD C DAVIS [id number] AT 1137 BY CM    Candida krusei NOT DETECTED NOT DETECTED Final   Candida parapsilosis NOT DETECTED NOT DETECTED Final   Candida tropicalis NOT DETECTED NOT DETECTED Final   Cryptococcus neoformans/gattii NOT  DETECTED NOT DETECTED Final    Comment: Performed at [facility], [address] [place], [place] [address]  Culture, blood (Routine X 2) w Reflex to ID Panel     Status: None (Preliminary result)   Collection Time: 10/20/23  2:06 PM   Specimen: BLOOD  Result Value Ref Range Status   Specimen Description   Final    BLOOD SITE NOT SPECIFIED Performed at [facility], [address], [place], [place] [address]    Special Requests   Final    BOTTLES DRAWN AEROBIC ONLY Blood Culture results may not be optimal due to an inadequate volume of blood received in culture bottles Performed at [facility], [address], [place], [place] [address]    Culture   Final    NO GROWTH < 24 HOURS Performed at [facility], [address], [place], [place] [address]    Report Status PENDING  Incomplete  Culture, blood (Routine X 2) w Reflex to ID Panel     Status: None (Preliminary result)   Collection Time: 10/20/23  2:14 PM   Specimen: BLOOD  Result Value Ref Range Status   Specimen Description   Final    BLOOD SITE NOT SPECIFIED Performed at [facility], [address], [place], [place] [address]    Special Requests   Final    BOTTLES DRAWN AEROBIC ONLY Blood Culture results may not be optimal due to an inadequate volume of blood received in culture bottles Performed at [facility], [address], [place], [place] [address]    Culture   Final    NO GROWTH < 24 HOURS Performed at [facility], [address], [place], [place] [address]    Report Status PENDING  Incomplete         Radiology Studies: CT  ABDOMEN PELVIS W CONTRAST Result Date: 10/20/2023 CLINICAL DATA:  infection.  Sepsis secondary to UTI. EXAM: CT ABDOMEN AND PELVIS WITH CONTRAST TECHNIQUE: Multidetector CT imaging of the abdomen and pelvis was performed using the standard protocol following bolus administration of intravenous contrast. RADIATION DOSE REDUCTION: This exam was performed according to the departmental dose-optimization program which includes automated exposure control, adjustment of the mA and/or kV according to patient size and/or use of iterative reconstruction technique. CONTRAST:  OMNIPAQUE  IOHEXOL  300 MG/ML  SOLN COMPARISON:  CT abdomen pelvis 12/23/2021, CT abdomen pelvis 01/02/2022, CT abdomen pelvis 04/08/2023, CT renal 10/17/2023 FINDINGS: Lower chest: Bilateral trace pleural effusions. Hepatobiliary: The hepatic parenchyma is diffusely hypodense compared to the splenic parenchyma consistent with fatty infiltration. No focal liver abnormality. No gallstones, gallbladder wall thickening, or pericholecystic fluid. No biliary dilatation. Pancreas: No focal lesion. Normal pancreatic contour. No surrounding inflammatory changes. No main pancreatic ductal dilatation. Spleen: Normal in size without focal abnormality. Adrenals/Urinary Tract: No adrenal nodule bilaterally. Bilateral kidneys enhance symmetrically. Right renal cortical scarring. Possible partial nephrectomy. Fluid dense lesion within the right kidney likely represents a simple renal cyst. Simple renal cysts, in the absence of clinically indicated signs/symptoms, require no independent follow-up. There is a 1.6 cm heterogeneous enhancing right renal mass better evaluated on coronal imaging (5:76). No hydronephrosis. No hydroureter.  No nephroureterolithiasis. The urinary bladder is unremarkable. Stomach/Bowel: Stomach is within normal limits. No evidence of bowel wall thickening or dilatation. Stool throughout the colon appendix appears normal. Vascular/Lymphatic:  No abdominal aorta or iliac aneurysm. Severe atherosclerotic plaque of the aorta and its branches.  Interval development of a borderline enlarged 1.1 cm retroperitoneal left periaortic lymph node (2:46). No pelvic or inguinal lymphadenopathy. Reproductive: Uterus and bilateral adnexa are unremarkable. Other: No intraperitoneal free fluid. No intraperitoneal free gas. No organized fluid collection. Musculoskeletal: No abdominal wall hernia or abnormality. No suspicious lytic or blastic osseous lesions. No acute displaced fracture. IMPRESSION: 1. A 1.6 cm heterogeneous enhancing right renal mass. Finding concerning for malignancy. When the patient is clinically stable and able to follow directions and hold their breath (preferably as an outpatient) further evaluation with dedicated MRI renal protocol should be considered. 2. Bilateral trace pleural effusions. 3. Hepatic steatosis. 4. Stool throughout the colon-correlate for constipation. 5.  Aortic Atherosclerosis (ICD10-I70.0). Electronically Signed   By: Morgane  Naveau M.D.   On: 10/20/2023 20:51   ECHOCARDIOGRAM COMPLETE Result Date: 10/20/2023    ECHOCARDIOGRAM REPORT   Patient Name:   MYSHA PEELER Date of Exam: 10/20/2023 Medical Rec #:  [id number]         Height:       67.5 in Accession #:    [id number]        Weight:       207.2 lb Date of Birth:  [birth year]         BSA:          2.064 m Patient Age:    52 years          BP:           158/78 mmHg Patient Gender: F                 HR:           75 bpm. Exam Location:  Inpatient Procedure: 2D Echo, Cardiac Doppler and Color Doppler Indications:    Endocarditis  History:        Patient has prior history of Echocardiogram examinations, most                 recent 09/08/2023. Risk Factors:Hypertension, Diabetes,                 Dyslipidemia and Former Smoker.  Sonographer:    Ozell Free Referring Phys: [id number] [facility] IMPRESSIONS  1. Left ventricular ejection fraction, by estimation, is 55 to 60%. The  left ventricle has normal function. The left ventricle has no regional wall motion abnormalities. There is mild concentric left ventricular hypertrophy. Left ventricular diastolic parameters are consistent with Grade I diastolic dysfunction (impaired relaxation).  2. Right ventricular systolic function is normal. The right ventricular size is normal.  3. No evidence of mitral valve regurgitation.  4. The aortic valve was not well visualized. Aortic valve regurgitation is not visualized.  5. The inferior vena cava is normal in size with greater than 50% respiratory variability, suggesting right atrial pressure of 3 mmHg. FINDINGS  Left Ventricle: Left ventricular ejection fraction, by estimation, is 55 to 60%. The left ventricle has normal function. The left ventricle has no regional wall motion abnormalities. The left ventricular internal cavity size was normal in size. There is  mild concentric left ventricular hypertrophy. Left ventricular diastolic parameters are consistent with Grade I diastolic dysfunction (impaired relaxation). Right Ventricle: The right ventricular size is normal. Right ventricular systolic function is normal. Left Atrium: Left atrial size was normal in size. Right Atrium: Right atrial size was normal in size. Pericardium: There is no evidence of pericardial effusion. Mitral Valve: No evidence of mitral valve regurgitation. Tricuspid Valve: Tricuspid valve regurgitation is not  demonstrated. Aortic Valve: The aortic valve was not well visualized. Aortic valve regurgitation is not visualized. Aortic valve mean gradient measures 5.0 mmHg. Aortic valve peak gradient measures 9.7 mmHg. Aortic valve area, by VTI measures 1.76 cm. Pulmonic Valve: Pulmonic valve regurgitation is not visualized. Aorta: The aortic root and ascending aorta are structurally normal, with no evidence of dilitation. Venous: The inferior vena cava is normal in size with greater than 50% respiratory variability, suggesting  right atrial pressure of 3 mmHg. IAS/Shunts: The interatrial septum was not well visualized.  LEFT VENTRICLE PLAX 2D LVIDd:         4.40 cm   Diastology LVIDs:         3.10 cm   LV e' medial:    6.53 cm/s LV PW:         1.20 cm   LV E/e' medial:  10.4 LV IVS:        1.20 cm   LV e' lateral:   7.07 cm/s LVOT diam:     2.00 cm   LV E/e' lateral: 9.6 LV SV:         52 LV SV Index:   25 LVOT Area:     3.14 cm  RIGHT VENTRICLE            IVC RV Basal diam:  3.30 cm    IVC diam: 1.70 cm RV S prime:     9.68 cm/s TAPSE (M-mode): 3.0 cm LEFT ATRIUM             Index        RIGHT ATRIUM           Index LA diam:        4.10 cm 1.99 cm/m   RA Area:     16.10 cm LA Vol (A2C):   58.8 ml 28.49 ml/m  RA Volume:   43.40 ml  21.02 ml/m LA Vol (A4C):   43.0 ml 20.83 ml/m LA Biplane Vol: 50.3 ml 24.37 ml/m  AORTIC VALVE AV Area (Vmax):    1.89 cm AV Area (Vmean):   1.83 cm AV Area (VTI):     1.76 cm AV Vmax:           156.00 cm/s AV Vmean:          106.000 cm/s AV VTI:            0.298 m AV Peak Grad:      9.7 mmHg AV Mean Grad:      5.0 mmHg LVOT Vmax:         94.00 cm/s LVOT Vmean:        61.600 cm/s LVOT VTI:          0.167 m LVOT/AV VTI ratio: 0.56  AORTA Ao Root diam: 2.90 cm Ao Asc diam:  2.30 cm MITRAL VALVE MV Area (PHT): 3.68 cm    SHUNTS MV Decel Time: 206 msec    Systemic VTI:  0.17 m MV E velocity: 68.10 cm/s  Systemic Diam: 2.00 cm MV A velocity: 77.60 cm/s MV E/A ratio:  0.88 Photographer signed by Ronal Ross Signature Date/Time: 10/20/2023/3:01:36 PM    Final          Scheduled Meds:  apixaban   5 mg Oral BID   atenolol   50 mg Oral BID   atorvastatin   80 mg Oral QHS   ezetimibe   10 mg Oral QHS   FLUoxetine   20 mg Oral Daily   gabapentin   400  mg Oral TID   insulin  aspart  0-15 Units Subcutaneous TID WC   insulin  glargine-yfgn  10 Units Subcutaneous QHS   levothyroxine   88 mcg Oral Q0600   losartan   37.5 mg Oral Daily   magnesium  oxide  400 mg Oral QODAY   melatonin  5 mg Oral  QHS   mometasone -formoterol   2 puff Inhalation BID   nitrofurantoin  (macrocrystal-monohydrate)  100 mg Oral QHS   pantoprazole   20 mg Oral Daily   QUEtiapine   25 mg Oral QHS   rOPINIRole   0.25 mg Oral QHS   umeclidinium bromide   1 puff Inhalation Daily   Continuous Infusions:  micafungin  (MYCAMINE ) 100 mg in sodium chloride  0.9 % 100 mL IVPB Stopped (10/20/23 1144)          Sophie Mao, MD [facility] 10/21/2023, 7:39 AM

## 2023-10-22 DIAGNOSIS — I8290 Acute embolism and thrombosis of unspecified vein: Secondary | ICD-10-CM

## 2023-10-22 DIAGNOSIS — B49 Unspecified mycosis: Secondary | ICD-10-CM | POA: Diagnosis not present

## 2023-10-22 DIAGNOSIS — I10 Essential (primary) hypertension: Secondary | ICD-10-CM

## 2023-10-22 DIAGNOSIS — J4489 Other specified chronic obstructive pulmonary disease: Secondary | ICD-10-CM | POA: Diagnosis not present

## 2023-10-22 LAB — MAGNESIUM: Magnesium: 2.4 mg/dL (ref 1.7–2.4)

## 2023-10-22 LAB — CBC WITH DIFFERENTIAL/PLATELET
Abs Immature Granulocytes: 0.03 10*3/uL (ref 0.00–0.07)
Basophils Absolute: 0 10*3/uL (ref 0.0–0.1)
Basophils Relative: 1 %
Eosinophils Absolute: 0.2 10*3/uL (ref 0.0–0.5)
Eosinophils Relative: 3 %
HCT: 36.9 % (ref 36.0–46.0)
Hemoglobin: 11.7 g/dL — ABNORMAL LOW (ref 12.0–15.0)
Immature Granulocytes: 0 %
Lymphocytes Relative: 39 %
Lymphs Abs: 2.8 10*3/uL (ref 0.7–4.0)
MCH: 27.1 pg (ref 26.0–34.0)
MCHC: 31.7 g/dL (ref 30.0–36.0)
MCV: 85.4 fL (ref 80.0–100.0)
Monocytes Absolute: 0.6 10*3/uL (ref 0.1–1.0)
Monocytes Relative: 8 %
Neutro Abs: 3.6 10*3/uL (ref 1.7–7.7)
Neutrophils Relative %: 49 %
Platelets: 249 10*3/uL (ref 150–400)
RBC: 4.32 MIL/uL (ref 3.87–5.11)
RDW: 13.3 % (ref 11.5–15.5)
WBC: 7.3 10*3/uL (ref 4.0–10.5)
nRBC: 0 % (ref 0.0–0.2)

## 2023-10-22 LAB — GLUCOSE, CAPILLARY
Glucose-Capillary: 151 mg/dL — ABNORMAL HIGH (ref 70–99)
Glucose-Capillary: 162 mg/dL — ABNORMAL HIGH (ref 70–99)
Glucose-Capillary: 240 mg/dL — ABNORMAL HIGH (ref 70–99)
Glucose-Capillary: 220 mg/dL — ABNORMAL HIGH (ref 70–99)

## 2023-10-22 LAB — BASIC METABOLIC PANEL
Anion gap: 11 (ref 5–15)
BUN: 18 mg/dL (ref 6–20)
CO2: 28 mmol/L (ref 22–32)
Calcium: 9.2 mg/dL (ref 8.9–10.3)
Chloride: 98 mmol/L (ref 98–111)
Creatinine, Ser: 0.93 mg/dL (ref 0.44–1.00)
GFR, Estimated: >60 mL/min (ref >60)
Glucose, Bld: 206 mg/dL — ABNORMAL HIGH (ref 70–99)
Potassium: 4 mmol/L (ref 3.5–5.1)
Sodium: 137 mmol/L (ref 135–145)

## 2023-10-22 LAB — CULTURE, BLOOD (ROUTINE X 2)
Culture: NO GROWTH
Special Requests: ADEQUATE

## 2023-10-22 LAB — C-REACTIVE PROTEIN: CRP: 2.6 mg/dL — ABNORMAL HIGH (ref <1.0)

## 2023-10-22 MED ORDER — PHENYLEPHRINE HCL 2.5 % OP SOLN
1.0000 [drp] | OPHTHALMIC | Status: DC
  Filled 2023-10-22: qty 2

## 2023-10-22 MED ORDER — TROPICAMIDE 1 % OP SOLN
1.0000 [drp] | OPHTHALMIC | Status: DC
  Administered 2023-10-22: 1 [drp] via OPHTHALMIC

## 2023-10-22 MED ORDER — PHENYLEPHRINE HCL 2.5 % OP SOLN: 1.0000 [drp] | OPHTHALMIC | Status: DC

## 2023-10-22 MED ORDER — PROPARACAINE HCL 0.5 % OP SOLN
1.0000 [drp] | OPHTHALMIC | Status: AC
  Administered 2023-10-22: 1 [drp] via OPHTHALMIC
  Filled 2023-10-22: qty 15

## 2023-10-22 MED ORDER — TROPICAMIDE 1 % OP SOLN
1.0000 [drp] | OPHTHALMIC | Status: DC
  Filled 2023-10-22: qty 3

## 2023-10-22 MED ORDER — PHENYLEPHRINE HCL 2.5 % OP SOLN
1.0000 [drp] | OPHTHALMIC | Status: DC
  Administered 2023-10-22: 1 [drp] via OPHTHALMIC

## 2023-10-22 MED ORDER — TROPICAMIDE 1 % OP SOLN: 1.0000 [drp] | OPHTHALMIC | Status: DC

## 2023-10-22 NOTE — Progress Notes (Addendum)
 [facility]  Date of Admission:  10/16/2023   Total days of inpatient antibiotics 5  Principal Problem:   Sepsis secondary to UTI [facility]) Active Problems:   Essential hypertension   COPD with chronic bronchitis (HCC)   hx of PE/DVT   Chronic respiratory failure with hypoxia (HCC)   Type 2 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 disease, with long-term current use of insulin  (HCC)   GERD without esophagitis   Hypothyroidism   Grade I diastolic dysfunction   Hemiparesis affecting left side as late effect of cerebrovascular accident (CVA) (HCC)   Class 1 obesity due to excess calories with body mass index (BMI) of 32.0 to 32.9 in adult   Dyslipidemia   Anxiety   Depression   Prolonged QT interval          Assessment: 53 year old female with history of ESBL E. coli UTI on suppressive Macrobid , diabetes mellitus, CKD, tobacco abuse admitted with sepsis secondary to presumed UTI, ID engaged for:  #Candida glabrata fungemia 2/2 unclear source - 1/10 blood culture growing yeast 1/2 set, Staph hominis out of 1 out of 2 sets contaminant.  BC ID positive for Candida glabrata - Urine cultures less than 10,000 colony - CT renal study showed no nephrolithiasis, obstruction uropathy no acute findings in noncontrast CT abdomen pelvis -CT w/ con showed right renal mass, recc mri renal protocol -TTE no veg, will get TEE as source is unclear Recommendations:  -Continue micafungin , plan on 2 weeks abx from negative Cx. -Follow repeat  blood Cx to ensure clearance -MRI renal protocol motion degraded recc CT or MRI in 4-6 weeks.   -engage optho given fungemia  #History of recurrent UTI - Continue suppressive Macrobid  #Diabetes mellitus #Tobacco abuse   Microbiology:   Antibiotics: Ceftriaxone  1/9 Merrem  1/10-1/12 Nuca 1/13- Macrobid  1/13-   Cultures: Blood 1/10 1/2 candida g and Staph hominis 1/13 Urine 1/10 less than 10,000  colonies Other  SUBJECTIVE: Resting in bed.  Discussed  plan as above.  Interval: afebrile overnight  Review of Systems: Review of Systems  All other systems reviewed and are negative.    Scheduled Meds:  apixaban   5 mg Oral BID   atenolol   50 mg Oral BID   atorvastatin   80 mg Oral QHS   ezetimibe   10 mg Oral QHS   FLUoxetine   20 mg Oral Daily   gabapentin   400 mg Oral TID   insulin  aspart  0-15 Units Subcutaneous TID WC   insulin  glargine-yfgn  10 Units Subcutaneous QHS   levothyroxine   88 mcg Oral Q0600   losartan   37.5 mg Oral Daily   magnesium  oxide  400 mg Oral QODAY   melatonin  5 mg Oral QHS   mometasone -formoterol   2 puff Inhalation BID   nitrofurantoin  (macrocrystal-monohydrate)  100 mg Oral QHS   pantoprazole   20 mg Oral Daily   QUEtiapine   25 mg Oral QHS   rOPINIRole   0.25 mg Oral QHS   senna-docusate  1 tablet Oral BID   umeclidinium bromide   1 puff Inhalation Daily   Continuous Infusions:  micafungin  (MYCAMINE ) 100 mg in sodium chloride  0.9 % 100 mL IVPB 100 mg (10/21/23 1048)   PRN Meds:.acetaminophen  **OR** acetaminophen , bisacodyl , polyethylene glycol, prochlorperazine , traMADol  Allergies  Allergen Reactions   Cipro  [Ciprofloxacin  Hcl] Hives   Guaifenesin  Anaphylaxis   Kiwi Extract Anaphylaxis and Rash   Levaquin [Levofloxacin] Shortness Of Breath and Rash   Strawberry Extract  Anaphylaxis and Rash   Lioresal [Baclofen] Other (See Comments)    Near syncope/ fall    OBJECTIVE: Vitals:   10/21/23 1131 10/21/23 2046 10/21/23 2106 10/22/23 0538  BP: (!) 148/84  (!) 154/78 120/87  Pulse: 73  74 75  Resp: 20  18 20   Temp: 98.3 F (36.8 C)  98.6 F (37 C) 98.8 F (37.1 C)  TempSrc: Oral  Oral Oral  SpO2: 95% 100% 96% 93%  Weight:      Height:       Body mass index is 31.97 kg/m.  Physical Exam Constitutional:      Appearance: Normal appearance.  HENT:     Head: Normocephalic and atraumatic.     Right Ear: Tympanic membrane normal.      Left Ear: Tympanic membrane normal.     Nose: Nose normal.     Mouth/Throat:     Mouth: Mucous membranes are moist.  Eyes:     Extraocular Movements: Extraocular movements intact.     Conjunctiva/sclera: Conjunctivae normal.     Pupils: Pupils are equal, round, and reactive to light.  Cardiovascular:     Rate and Rhythm: Normal rate and regular rhythm.     Heart sounds: No murmur heard.    No friction rub. No gallop.  Pulmonary:     Effort: Pulmonary effort is normal.     Breath sounds: Normal breath sounds.  Abdominal:     General: Abdomen is flat.     Palpations: Abdomen is soft.  Musculoskeletal:        General: Normal range of motion.  Skin:    General: Skin is warm and dry.  Neurological:     General: No focal deficit present.     Mental Status: She is alert and oriented to person, place, and time.  Psychiatric:        Mood and Affect: Mood normal.       Lab Results Lab Results  Component Value Date   WBC 7.3 10/22/2023   HGB 11.7 (L) 10/22/2023   HCT 36.9 10/22/2023   MCV 85.4 10/22/2023   PLT 249 10/22/2023    Lab Results  Component Value Date   CREATININE 0.93 10/22/2023   BUN 18 10/22/2023   NA 137 10/22/2023   K 4.0 10/22/2023   CL 98 10/22/2023   CO2 28 10/22/2023    Lab Results  Component Value Date   ALT 11 10/18/2023   AST 14 (L) 10/18/2023   GGT 173 (H) 11/21/2019   ALKPHOS 47 10/18/2023   BILITOT 0.8 10/18/2023        Loney Stank, MD [facility] 10/22/2023, 6:01 AM  I have personally spent 52 minutes involved in face-to-face and non-face-to-face activities for this patient on the day of the visit. Professional time spent includes the following activities: Preparing to see the patient (review of tests), Obtaining and/or reviewing separately obtained history (admission/discharge record), Performing a medically appropriate examination and/or evaluation , Ordering  medications/tests/procedures, referring and communicating with other health care professionals, Documenting clinical information in the EMR, Independently interpreting results (not separately reported), Communicating results to the patient/family/caregiver, Counseling and educating the patient/family/caregiver and Care coordination (not separately reported).  e

## 2023-10-22 NOTE — Progress Notes (Signed)
   [facility] has been requested to perform a transesophageal echocardiogram on Karla Hoover for bacteremia.     The patient does NOT have any absolute or relative contraindications to a Transesophageal Echocardiogram (TEE). She is currently on [place]@3L  which she reports using intermittently at her facility as well. Follow up in am to ensure no increased O2 requirement   The patient has: Current Oxygen  Requirement  After careful review of history and examination, the risks and benefits of transesophageal echocardiogram have been explained including risks of esophageal damage, perforation (1:10,000 risk), bleeding, pharyngeal hematoma as well as other potential complications associated with conscious sedation including aspiration, arrhythmia, respiratory failure and death. Alternatives to treatment were discussed, questions were answered. Patient is willing to proceed.   Signed, Johnie Nailer, NP  10/22/2023 3:15 PM

## 2023-10-22 NOTE — Progress Notes (Signed)
 PROGRESS NOTE    Karla Hoover  JWJ:[id number] DOB: 27-Nov-1970 DOA: 10/16/2023 PCP: Pcp, No   Brief Narrative:  53 y.o. female with medical history significant of asthma, seasonal allergies, stage 3a CKD, bipolar disorder, class I obesity, tobacco abuse, COPD with chronic bronchitis and chronic respiratory failure with hypoxia, type II DM, hypertension, GERD, grade 1 diastolic dysfunction, hypothyroidism, hyperlipidemia, bilateral pulmonary embolism, DVT, history of other non-hemorrhagic stroke with residual left-sided hemiparesis and recent admission last month for ESBL E. coli and Klebsiella UTI treated with antibiotics presented with nausea, vomiting, fever, dysuria and suprapubic/flank pain.  On presentation, UA was suggestive of UTI; WBCs of 17, creatinine of 1.44 and lactic acid of 2.2.  Chest x-ray showed no active disease.  CT renal stone study showed no acute findings in the abdomen or pelvis with no nephrolithiasis or obstructive uropathy.  She was started on IV fluids and broad-spectrum antibiotics.  Blood cultures turned positive for Candida glabrata on 10/20/2023: ID was auto consulted; meropenem  discontinued and Mycamine  was started.  Assessment & Plan:  Fungemia  Sepsis: Present on admission UTI: Present on admission Lactic acidosis: Resolved -Presented with fever, lactic acidosis, leukocytosis with possible UTI -Had ESBL E. coli during last admission.  Initially started on meropenem .  Urine cultures had insignificant growth.  Blood culture growing Staph hominis in 1 bottle: Possibly contaminant Blood cultures turned positive for Candida glabrata on 10/20/2023. ID was consulted.  Repeat blood cultures were ordered. Patient was started on Mycamine . Echocardiogram did not show any concerning findings. Considering fungemia ophthalmology evaluation needs to be pursued.  Right renal mass Noted on CT scan.  Underwent MRI in which the renal lesion was poorly evaluated.   Recommendation is to repeat imaging study in a few weeks.  Can be pursued in the outpatient setting.  Leukocytosis -Resolved  Diabetes mellitus type 2 with hyperglycemia Peripheral neuropathy -Carb modified diet.  Continue long-acting insulin  along with CBGs with SSI.  Continue gabapentin .  CBGs are reasonably well-controlled.  Chronic respiratory failure with hypoxia COPD -Stable.  Continue supplemental oxygen .  Continue current inhaled therapy.  Outpatient follow-up with pulmonary  Essential hypertension Continue losartan  and atenolol   History of unspecified CVA with left-sided hemiparesis Hyperlipidemia -Continue supportive care.  Outpatient follow-up with neurology.  Continue ezetimibe /statin.  PT recommended no PT follow-up  History of PE/DVT -Continue Eliquis   Grade 1 diastolic dysfunction -Continue beta-blocker and losartan    CKD stage IIIa -Creatinine currently stable.  Monitor  Anxiety/depression -Continue fluoxetine   Prolonged QT -Monitor on telemetry.  Avoid QT prolonging meds as much as possible  Obesity -Outpatient follow-up  DVT prophylaxis:  Eliquis  Code Status: Full Family Communication: None at bedside Disposition Plan: From SNF  Consultants: ID  Procedures: None  Antimicrobials:  Anti-infectives (From admission, onward)    Start     Dose/Rate Route Frequency Ordered Stop   10/20/23 2200  nitrofurantoin  (macrocrystal-monohydrate) (MACROBID ) capsule 100 mg        100 mg Oral Daily at bedtime 10/20/23 1340     10/20/23 1115  micafungin  (MYCAMINE ) 100 mg in sodium chloride  0.9 % 100 mL IVPB        100 mg 105 mL/hr over 1 Hours Intravenous Daily 10/20/23 1020     10/18/23 0600  cefTRIAXone  (ROCEPHIN ) 2 g in sodium chloride  0.9 % 100 mL IVPB  Status:  Discontinued        2 g 200 mL/hr over 30 Minutes Intravenous Every 24 hours 10/17/23 0747 10/17/23 0902   10/17/23 0900  meropenem  (MERREM ) 1 g in sodium chloride  0.9 % 100 mL IVPB  Status:   Discontinued        1 g 200 mL/hr over 30 Minutes Intravenous Every 8 hours 10/17/23 0858 10/20/23 1043   10/17/23 0145  cefTRIAXone  (ROCEPHIN ) 2 g in sodium chloride  0.9 % 100 mL IVPB        2 g 200 mL/hr over 30 Minutes Intravenous Once 10/17/23 0132 10/17/23 0305      Subjective: Patient's mother is at bedside.  Patient complains of lower back pain which is chronic for her.  Denies any vision difficulties at this time.  Objective: Vitals:   10/21/23 1131 10/21/23 2046 10/21/23 2106 10/22/23 0538  BP: (!) 148/84  (!) 154/78 120/87  Pulse: 73  74 75  Resp: 20  18 20   Temp: 98.3 F (36.8 C)  98.6 F (37 C) 98.8 F (37.1 C)  TempSrc: Oral  Oral Oral  SpO2: 95% 100% 96% 93%  Weight:      Height:        Intake/Output Summary (Last 24 hours) at 10/22/2023 1026 Last data filed at 10/21/2023 2217 Gross per 24 hour  Intake 445 ml  Output 800 ml  Net -355 ml   Filed Weights   10/17/23 1628  Weight: 94 kg    Examination:   General appearance: Awake alert.  In no distress Resp: Clear to auscultation bilaterally.  Normal effort Cardio: S1-S2 is normal regular.  No S3-S4.  No rubs murmurs or bruit GI: Abdomen is soft.  Nontender nondistended.  Bowel sounds are present normal.  No masses organomegaly Extremities: No edema.  Full range of motion of lower extremities. Neurologic: Has chronic left hemiparesis No lesions identified in the back.  Data Reviewed: I have personally reviewed following labs and imaging studies  CBC: Recent Labs  Lab 10/17/23 0107 10/17/23 1948 10/18/23 0521 10/19/23 0448 10/22/23 0411  WBC 17.0*  --  10.1 7.2 7.3  NEUTROABS  --   --  6.2 3.8 3.6  HGB 13.2 11.4* 10.7* 10.2* 11.7*  HCT 41.1 34.8* 34.6* 32.3* 36.9  MCV 86.3  --  88.7 87.5 85.4  PLT 232  --  166 180 249   Basic Metabolic Panel: Recent Labs  Lab 10/17/23 0107 10/18/23 0521 10/19/23 0448 10/20/23 0422 10/22/23 0411  NA 130* 136 137 137 137  K 5.0 4.3 4.2 4.3 4.0  CL 96*  100 100 96* 98  CO2 25 28 28 29 28   GLUCOSE 271* 163* 181* 152* 206*  BUN 25* 21* 15 12 18   CREATININE 1.44* 1.41* 1.10* 1.02* 0.93  CALCIUM  8.9 8.7* 8.8* 9.4 9.2  MG  --   --  1.9 1.8 2.4   GFR: Estimated Creatinine Clearance: 85.1 mL/min (by C-G formula based on SCr of 0.93 mg/dL). Liver Function Tests: Recent Labs  Lab 10/17/23 0107 10/18/23 0521  AST 22 14*  ALT 14 11  ALKPHOS 64 47  BILITOT 0.7 0.8  PROT 7.6 5.9*  ALBUMIN 4.0 2.6*   Recent Labs  Lab 10/17/23 0107  LIPASE 22    Coagulation Profile: Recent Labs  Lab 10/17/23 0107  INR 1.6*    CBG: Recent Labs  Lab 10/21/23 0739 10/21/23 1127 10/21/23 1625 10/21/23 2101 10/22/23 0739  GLUCAP 147* 147* 149* 172* 151*    Sepsis Labs: Recent Labs  Lab 10/17/23 0125 10/17/23 0302 10/17/23 2230  LATICACIDVEN 2.2* 2.2* 0.9    Recent Results (from the past 240 hours)  Resp  panel by RT-PCR (RSV, Flu A&B, Covid) Anterior Nasal Swab     Status: None   Collection Time: 10/16/23 11:17 PM   Specimen: Anterior Nasal Swab  Result Value Ref Range Status   SARS Coronavirus 2 by RT PCR NEGATIVE NEGATIVE Final    Comment: (NOTE) SARS-CoV-2 target nucleic acids are NOT DETECTED.  The SARS-CoV-2 RNA is generally detectable in upper respiratory specimens during the acute phase of infection. The lowest concentration of SARS-CoV-2 viral copies this assay can detect is 138 copies/mL. A negative result does not preclude SARS-Cov-2 infection and should not be used as the sole basis for treatment or other patient management decisions. A negative result may occur with  improper specimen collection/handling, submission of specimen other than nasopharyngeal swab, presence of viral mutation(s) within the areas targeted by this assay, and inadequate number of viral copies(<138 copies/mL). A negative result must be combined with clinical observations, patient history, and epidemiological information. The expected result is  Negative.  Fact Sheet for Patients:  BloggerCourse.com  Fact Sheet for Healthcare Providers:  SeriousBroker.it  This test is no t yet approved or cleared by the [place]  FDA and  has been authorized for detection and/or diagnosis of SARS-CoV-2 by FDA under an Emergency Use Authorization (EUA). This EUA will remain  in effect (meaning this test can be used) for the duration of the COVID-19 declaration under Section 564(b)(1) of the Act, 21 U.S.C.section 360bbb-3(b)(1), unless the authorization is terminated  or revoked sooner.       Influenza A by PCR NEGATIVE NEGATIVE Final   Influenza B by PCR NEGATIVE NEGATIVE Final    Comment: (NOTE) The Xpert Xpress SARS-CoV-2/FLU/RSV plus assay is intended as an aid in the diagnosis of influenza from Nasopharyngeal swab specimens and should not be used as a sole basis for treatment. Nasal washings and aspirates are unacceptable for Xpert Xpress SARS-CoV-2/FLU/RSV testing.  Fact Sheet for Patients: BloggerCourse.com  Fact Sheet for Healthcare Providers: SeriousBroker.it  This test is not yet approved or cleared by the [place]  FDA and has been authorized for detection and/or diagnosis of SARS-CoV-2 by FDA under an Emergency Use Authorization (EUA). This EUA will remain in effect (meaning this test can be used) for the duration of the COVID-19 declaration under Section 564(b)(1) of the Act, 21 U.S.C. section 360bbb-3(b)(1), unless the authorization is terminated or revoked.     Resp Syncytial Virus by PCR NEGATIVE NEGATIVE Final    Comment: (NOTE) Fact Sheet for Patients: BloggerCourse.com  Fact Sheet for Healthcare Providers: SeriousBroker.it  This test is not yet approved or cleared by the [place]  FDA and has been authorized for detection and/or diagnosis of  SARS-CoV-2 by FDA under an Emergency Use Authorization (EUA). This EUA will remain in effect (meaning this test can be used) for the duration of the COVID-19 declaration under Section 564(b)(1) of the Act, 21 U.S.C. section 360bbb-3(b)(1), unless the authorization is terminated or revoked.  Performed at [facility], [address], [place], [place] [address]   Urine Culture     Status: Abnormal   Collection Time: 10/17/23  1:07 AM   Specimen: Urine, Random  Result Value Ref Range Status   Specimen Description   Final    URINE, RANDOM Performed at [facility], [address] [place], [place] [address]    Special Requests   Final    NONE Reflexed from [id number] Performed at [facility], [address], [place], [place] [address]  Culture (A)  Final    <10,000 COLONIES/mL INSIGNIFICANT GROWTH Performed at [facility], [address], [place], [place] [address]    Report Status 10/18/2023 FINAL  Final  Blood Culture (routine x 2)     Status: Abnormal (Preliminary result)   Collection Time: 10/17/23  1:18 AM   Specimen: BLOOD LEFT FOREARM  Result Value Ref Range Status   Specimen Description   Final    BLOOD LEFT FOREARM Performed at [facility], [address] [place], [place] [address]    Special Requests   Final    BOTTLES DRAWN AEROBIC AND ANAEROBIC Blood Culture adequate volume Performed at [facility], [address], [place], [place] [address]    Culture  Setup Time   Final    GRAM POSITIVE COCCI ANAEROBIC BOTTLE ONLY CRITICAL RESULT CALLED TO, READ BACK BY AND VERIFIED WITH: PHARMD E JACKSON 10/18/2023 @ 0003 BY AB YEAST AEROBIC BOTTLE ONLY CRITICAL RESULT CALLED TO, READ BACK BY AND VERIFIED WITH: PHARMD C DAVIS [id number] AT 1137 BY CM    Culture (A)  Final    STAPHYLOCOCCUS HOMINIS THE SIGNIFICANCE OF ISOLATING THIS ORGANISM FROM A SINGLE SET OF BLOOD  CULTURES WHEN MULTIPLE SETS ARE DRAWN IS UNCERTAIN. PLEASE NOTIFY THE MICROBIOLOGY DEPARTMENT WITHIN ONE WEEK IF SPECIATION AND SENSITIVITIES ARE REQUIRED. CANDIDA GLABRATA Sent to [facility] for further susceptibility testing. Performed at [facility], [address] [place], [place] [address]    Report Status PENDING  Incomplete  Blood Culture (routine x 2)     Status: None   Collection Time: 10/17/23  1:18 AM   Specimen: BLOOD RIGHT FOREARM  Result Value Ref Range Status   Specimen Description   Final    BLOOD RIGHT FOREARM Performed at [facility], [address] [place], [place] [address]    Special Requests   Final    BOTTLES DRAWN AEROBIC AND ANAEROBIC Blood Culture adequate volume Performed at [facility], [address], [place], [place] [address]    Culture   Final    NO GROWTH 5 DAYS Performed at [facility], [address], [place], [place] [address]    Report Status 10/22/2023 FINAL  Final  Blood Culture ID Panel (Reflexed)     Status: Abnormal   Collection Time: 10/17/23  1:18 AM  Result Value Ref Range Status   Enterococcus faecalis NOT DETECTED NOT DETECTED Final   Enterococcus Faecium NOT DETECTED NOT DETECTED Final   Listeria monocytogenes NOT DETECTED NOT DETECTED Final   Staphylococcus species DETECTED (A) NOT DETECTED Final    Comment: CRITICAL RESULT CALLED TO, READ BACK BY AND VERIFIED WITH: PHARMD E JACKSON 10/18/2023 @ 0003 BY AB    Staphylococcus aureus (BCID) NOT DETECTED NOT DETECTED Final   Staphylococcus epidermidis NOT DETECTED NOT DETECTED Final   Staphylococcus lugdunensis NOT DETECTED NOT DETECTED Final   Streptococcus species NOT DETECTED NOT DETECTED Final   Streptococcus agalactiae NOT DETECTED NOT DETECTED Final   Streptococcus pneumoniae NOT DETECTED NOT DETECTED Final   Streptococcus pyogenes NOT DETECTED NOT DETECTED Final   A.calcoaceticus-baumannii NOT DETECTED NOT DETECTED Final    Bacteroides fragilis NOT DETECTED NOT DETECTED Final   Enterobacterales NOT DETECTED NOT DETECTED Final   Enterobacter cloacae complex NOT DETECTED NOT DETECTED Final   Escherichia coli NOT DETECTED NOT DETECTED Final   Klebsiella aerogenes NOT DETECTED NOT DETECTED Final   Klebsiella oxytoca NOT DETECTED NOT DETECTED  Final   Klebsiella pneumoniae NOT DETECTED NOT DETECTED Final   Proteus species NOT DETECTED NOT DETECTED Final   Salmonella species NOT DETECTED NOT DETECTED Final   Serratia marcescens NOT DETECTED NOT DETECTED Final   Haemophilus influenzae NOT DETECTED NOT DETECTED Final   Neisseria meningitidis NOT DETECTED NOT DETECTED Final   Pseudomonas aeruginosa NOT DETECTED NOT DETECTED Final   Stenotrophomonas maltophilia NOT DETECTED NOT DETECTED Final   Candida albicans NOT DETECTED NOT DETECTED Final   Candida auris NOT DETECTED NOT DETECTED Final   Candida glabrata NOT DETECTED NOT DETECTED Final   Candida krusei NOT DETECTED NOT DETECTED Final   Candida parapsilosis NOT DETECTED NOT DETECTED Final   Candida tropicalis NOT DETECTED NOT DETECTED Final   Cryptococcus neoformans/gattii NOT DETECTED NOT DETECTED Final    Comment: Performed at [facility], [address] [place], [place] [address]  Blood Culture ID Panel (Reflexed)     Status: Abnormal   Collection Time: 10/17/23  1:18 AM  Result Value Ref Range Status   Enterococcus faecalis NOT DETECTED NOT DETECTED Final   Enterococcus Faecium NOT DETECTED NOT DETECTED Final   Listeria monocytogenes NOT DETECTED NOT DETECTED Final   Staphylococcus species NOT DETECTED NOT DETECTED Final   Staphylococcus aureus (BCID) NOT DETECTED NOT DETECTED Final   Staphylococcus epidermidis NOT DETECTED NOT DETECTED Final   Staphylococcus lugdunensis NOT DETECTED NOT DETECTED Final   Streptococcus species NOT DETECTED NOT DETECTED Final   Streptococcus agalactiae NOT DETECTED NOT DETECTED Final   Streptococcus pneumoniae NOT  DETECTED NOT DETECTED Final   Streptococcus pyogenes NOT DETECTED NOT DETECTED Final   A.calcoaceticus-baumannii NOT DETECTED NOT DETECTED Final   Bacteroides fragilis NOT DETECTED NOT DETECTED Final   Enterobacterales NOT DETECTED NOT DETECTED Final   Enterobacter cloacae complex NOT DETECTED NOT DETECTED Final   Escherichia coli NOT DETECTED NOT DETECTED Final   Klebsiella aerogenes NOT DETECTED NOT DETECTED Final   Klebsiella oxytoca NOT DETECTED NOT DETECTED Final   Klebsiella pneumoniae NOT DETECTED NOT DETECTED Final   Proteus species NOT DETECTED NOT DETECTED Final   Salmonella species NOT DETECTED NOT DETECTED Final   Serratia marcescens NOT DETECTED NOT DETECTED Final   Haemophilus influenzae NOT DETECTED NOT DETECTED Final   Neisseria meningitidis NOT DETECTED NOT DETECTED Final   Pseudomonas aeruginosa NOT DETECTED NOT DETECTED Final   Stenotrophomonas maltophilia NOT DETECTED NOT DETECTED Final   Candida albicans NOT DETECTED NOT DETECTED Final   Candida auris NOT DETECTED NOT DETECTED Final   Candida glabrata DETECTED (A) NOT DETECTED Final    Comment: CRITICAL RESULT CALLED TO, READ BACK BY AND VERIFIED WITH: PHARMD C DAVIS [id number] AT 1137 BY CM    Candida krusei NOT DETECTED NOT DETECTED Final   Candida parapsilosis NOT DETECTED NOT DETECTED Final   Candida tropicalis NOT DETECTED NOT DETECTED Final   Cryptococcus neoformans/gattii NOT DETECTED NOT DETECTED Final    Comment: Performed at [facility], [address], [place], [place] [address]  Culture, blood (Routine X 2) w Reflex to ID Panel     Status: None (Preliminary result)   Collection Time: 10/20/23  2:06 PM   Specimen: BLOOD  Result Value Ref Range Status   Specimen Description   Final    BLOOD SITE NOT SPECIFIED Performed at [facility], [address], [place], [place] [address]    Special Requests   Final    BOTTLES DRAWN AEROBIC ONLY Blood Culture results may  not be optimal  due to an inadequate volume of blood received in culture bottles Performed at [facility], [address] [place], [place] [address]    Culture   Final    NO GROWTH 2 DAYS Performed at [facility], [address], [place], [place] [address]    Report Status PENDING  Incomplete  Culture, blood (Routine X 2) w Reflex to ID Panel     Status: None (Preliminary result)   Collection Time: 10/20/23  2:14 PM   Specimen: BLOOD  Result Value Ref Range Status   Specimen Description   Final    BLOOD SITE NOT SPECIFIED Performed at [facility], [address], [place], [place] [address]    Special Requests   Final    BOTTLES DRAWN AEROBIC ONLY Blood Culture results may not be optimal due to an inadequate volume of blood received in culture bottles Performed at [facility], [address], [place], [place] [address]    Culture   Final    NO GROWTH 2 DAYS Performed at [facility], [address], [place], [place] [address]    Report Status PENDING  Incomplete         Radiology Studies: MR ABDOMEN W WO CONTRAST Result Date: 10/21/2023 CLINICAL DATA:  Renal mass on CT EXAM: MRI ABDOMEN WITHOUT AND WITH CONTRAST TECHNIQUE: Multiplanar multisequence MR imaging of the abdomen was performed both before and after the administration of intravenous contrast. CONTRAST:  9mL GADAVIST  GADOBUTROL  1 MMOL/ML IV SOLN COMPARISON:  CT abdomen/pelvis dated 10/20/2023 FINDINGS: Motion degraded images. Lower chest: Lung bases are clear. Hepatobiliary: Mild hepatic steatosis. Liver is otherwise within normal limits. Gallbladder is unremarkable. No intrahepatic or extrahepatic duct dilatation. Pancreas:  Within normal limits. Spleen:  Within normal limits. Adrenals/Urinary Tract:  Adrenal glands are within normal limits. Left kidney is within normal limits. Right renal cortical scarring/lobulation. Given motion degradation, the rounded area on CT  is poorly evaluated, but mild restricted diffusion is suspected (series 6/image 22) with subtle differential enhancement (series 11/image 50). As such, small solid renal neoplasm is difficult to exclude, but not well characterized on the current study. For reference, this lesion measures approximately 13 mm on MR (series 3/image 24). No hydronephrosis. Stomach/Bowel: Stomach is within normal limits. Visualized bowel is unremarkable. Vascular/Lymphatic:  No evidence of abdominal aortic aneurysm. No suspicious abdominal lymphadenopathy. Other:  No abdominal ascites. Musculoskeletal: No focal osseous lesions. IMPRESSION: Motion degraded images. 13 mm right renal lesion is poorly evaluated, but small solid renal neoplasm is difficult to exclude, although not well characterized on the current study. Consider follow-up CT or MRI abdomen with/without contrast in 4-6 weeks. Mild hepatic steatosis. Electronically Signed   By: Zadie Herter M.D.   On: 10/21/2023 23:26   CT ABDOMEN PELVIS W CONTRAST Result Date: 10/20/2023 CLINICAL DATA:  infection.  Sepsis secondary to UTI. EXAM: CT ABDOMEN AND PELVIS WITH CONTRAST TECHNIQUE: Multidetector CT imaging of the abdomen and pelvis was performed using the standard protocol following bolus administration of intravenous contrast. RADIATION DOSE REDUCTION: This exam was performed according to the departmental dose-optimization program which includes automated exposure control, adjustment of the mA and/or kV according to patient size and/or use of iterative reconstruction technique. CONTRAST:  OMNIPAQUE  IOHEXOL  300 MG/ML  SOLN COMPARISON:  CT abdomen pelvis 12/23/2021, CT abdomen pelvis 01/02/2022, CT abdomen pelvis 04/08/2023, CT renal 10/17/2023 FINDINGS: Lower chest: Bilateral trace pleural effusions. Hepatobiliary: The hepatic parenchyma  is diffusely hypodense compared to the splenic parenchyma consistent with fatty infiltration. No focal liver abnormality. No  gallstones, gallbladder wall thickening, or pericholecystic fluid. No biliary dilatation. Pancreas: No focal lesion. Normal pancreatic contour. No surrounding inflammatory changes. No main pancreatic ductal dilatation. Spleen: Normal in size without focal abnormality. Adrenals/Urinary Tract: No adrenal nodule bilaterally. Bilateral kidneys enhance symmetrically. Right renal cortical scarring. Possible partial nephrectomy. Fluid dense lesion within the right kidney likely represents a simple renal cyst. Simple renal cysts, in the absence of clinically indicated signs/symptoms, require no independent follow-up. There is a 1.6 cm heterogeneous enhancing right renal mass better evaluated on coronal imaging (5:76). No hydronephrosis. No hydroureter.  No nephroureterolithiasis. The urinary bladder is unremarkable. Stomach/Bowel: Stomach is within normal limits. No evidence of bowel wall thickening or dilatation. Stool throughout the colon appendix appears normal. Vascular/Lymphatic: No abdominal aorta or iliac aneurysm. Severe atherosclerotic plaque of the aorta and its branches. Interval development of a borderline enlarged 1.1 cm retroperitoneal left periaortic lymph node (2:46). No pelvic or inguinal lymphadenopathy. Reproductive: Uterus and bilateral adnexa are unremarkable. Other: No intraperitoneal free fluid. No intraperitoneal free gas. No organized fluid collection. Musculoskeletal: No abdominal wall hernia or abnormality. No suspicious lytic or blastic osseous lesions. No acute displaced fracture. IMPRESSION: 1. A 1.6 cm heterogeneous enhancing right renal mass. Finding concerning for malignancy. When the patient is clinically stable and able to follow directions and hold their breath (preferably as an outpatient) further evaluation with dedicated MRI renal protocol should be considered. 2. Bilateral trace pleural effusions. 3. Hepatic steatosis. 4. Stool throughout the colon-correlate for constipation. 5.   Aortic Atherosclerosis (ICD10-I70.0). Electronically Signed   By: Morgane  Naveau M.D.   On: 10/20/2023 20:51   ECHOCARDIOGRAM COMPLETE Result Date: 10/20/2023    ECHOCARDIOGRAM REPORT   Patient Name:   RUBYANN GRIEB Date of Exam: 10/20/2023 Medical Rec #:  [id number]         Height:       67.5 in Accession #:    [id number]        Weight:       207.2 lb Date of Birth:  1971-02-27         BSA:          2.064 m Patient Age:    52 years          BP:           158/78 mmHg Patient Gender: F                 HR:           75 bpm. Exam Location:  Inpatient Procedure: 2D Echo, Cardiac Doppler and Color Doppler Indications:    Endocarditis  History:        Patient has prior history of Echocardiogram examinations, most                 recent 09/08/2023. Risk Factors:Hypertension, Diabetes,                 Dyslipidemia and Former Smoker.  Sonographer:    Reta Cassis Referring Phys: [id number] [facility] IMPRESSIONS  1. Left ventricular ejection fraction, by estimation, is 55 to 60%. The left ventricle has normal function. The left ventricle has no regional wall motion abnormalities. There is mild concentric left ventricular hypertrophy. Left ventricular diastolic parameters are consistent with Grade I diastolic dysfunction (impaired relaxation).  2. Right ventricular systolic function is normal. The right ventricular size is normal.  3. No evidence of mitral valve regurgitation.  4. The aortic valve was not well visualized. Aortic valve regurgitation is not visualized.  5. The inferior vena cava is normal in size with greater than 50% respiratory variability, suggesting right atrial pressure of 3 mmHg. FINDINGS  Left Ventricle: Left ventricular ejection fraction, by estimation, is 55 to 60%. The left ventricle has normal function. The left ventricle has no regional wall motion abnormalities. The left ventricular internal cavity size was normal in size. There is  mild concentric left ventricular hypertrophy. Left ventricular  diastolic parameters are consistent with Grade I diastolic dysfunction (impaired relaxation). Right Ventricle: The right ventricular size is normal. Right ventricular systolic function is normal. Left Atrium: Left atrial size was normal in size. Right Atrium: Right atrial size was normal in size. Pericardium: There is no evidence of pericardial effusion. Mitral Valve: No evidence of mitral valve regurgitation. Tricuspid Valve: Tricuspid valve regurgitation is not demonstrated. Aortic Valve: The aortic valve was not well visualized. Aortic valve regurgitation is not visualized. Aortic valve mean gradient measures 5.0 mmHg. Aortic valve peak gradient measures 9.7 mmHg. Aortic valve area, by VTI measures 1.76 cm. Pulmonic Valve: Pulmonic valve regurgitation is not visualized. Aorta: The aortic root and ascending aorta are structurally normal, with no evidence of dilitation. Venous: The inferior vena cava is normal in size with greater than 50% respiratory variability, suggesting right atrial pressure of 3 mmHg. IAS/Shunts: The interatrial septum was not well visualized.  LEFT VENTRICLE PLAX 2D LVIDd:         4.40 cm   Diastology LVIDs:         3.10 cm   LV e' medial:    6.53 cm/s LV PW:         1.20 cm   LV E/e' medial:  10.4 LV IVS:        1.20 cm   LV e' lateral:   7.07 cm/s LVOT diam:     2.00 cm   LV E/e' lateral: 9.6 LV SV:         52 LV SV Index:   25 LVOT Area:     3.14 cm  RIGHT VENTRICLE            IVC RV Basal diam:  3.30 cm    IVC diam: 1.70 cm RV S prime:     9.68 cm/s TAPSE (M-mode): 3.0 cm LEFT ATRIUM             Index        RIGHT ATRIUM           Index LA diam:        4.10 cm 1.99 cm/m   RA Area:     16.10 cm LA Vol (A2C):   58.8 ml 28.49 ml/m  RA Volume:   43.40 ml  21.02 ml/m LA Vol (A4C):   43.0 ml 20.83 ml/m LA Biplane Vol: 50.3 ml 24.37 ml/m  AORTIC VALVE AV Area (Vmax):    1.89 cm AV Area (Vmean):   1.83 cm AV Area (VTI):     1.76 cm AV Vmax:           156.00 cm/s AV Vmean:           106.000 cm/s AV VTI:            0.298 m AV Peak Grad:      9.7 mmHg AV Mean Grad:      5.0 mmHg LVOT Vmax:  94.00 cm/s LVOT Vmean:        61.600 cm/s LVOT VTI:          0.167 m LVOT/AV VTI ratio: 0.56  AORTA Ao Root diam: 2.90 cm Ao Asc diam:  2.30 cm MITRAL VALVE MV Area (PHT): 3.68 cm    SHUNTS MV Decel Time: 206 msec    Systemic VTI:  0.17 m MV E velocity: 68.10 cm/s  Systemic Diam: 2.00 cm MV A velocity: 77.60 cm/s MV E/A ratio:  0.88 Photographer signed by Alois Arnt Signature Date/Time: 10/20/2023/3:01:36 PM    Final      Scheduled Meds:  apixaban   5 mg Oral BID   atenolol   50 mg Oral BID   atorvastatin   80 mg Oral QHS   ezetimibe   10 mg Oral QHS   FLUoxetine   20 mg Oral Daily   gabapentin   400 mg Oral TID   insulin  aspart  0-15 Units Subcutaneous TID WC   insulin  glargine-yfgn  10 Units Subcutaneous QHS   levothyroxine   88 mcg Oral Q0600   losartan   37.5 mg Oral Daily   magnesium  oxide  400 mg Oral QODAY   melatonin  5 mg Oral QHS   mometasone -formoterol   2 puff Inhalation BID   nitrofurantoin  (macrocrystal-monohydrate)  100 mg Oral QHS   pantoprazole   20 mg Oral Daily   QUEtiapine   25 mg Oral QHS   rOPINIRole   0.25 mg Oral QHS   senna-docusate  1 tablet Oral BID   umeclidinium bromide   1 puff Inhalation Daily   Continuous Infusions:  micafungin  (MYCAMINE ) 100 mg in sodium chloride  0.9 % 100 mL IVPB 100 mg (10/21/23 1048)    Maylene Spear, MD [facility] 10/22/2023, 10:26 AM

## 2023-10-22 NOTE — Consult Note (Signed)
 EVELINE DUMAN                                                                               10/22/2023                                               Ophthalmology Consultation                                         Consult requested by: Marcie Sever  Reason for consultation:  Candida fungemia  HPI: 53 y.o. female with asthma, stage 3a CKD, bipolar disorder, tobacco abuse, COPD - chronic bronchiti/respiratory failure, DM II, HTN, GERD, grade 1 diastolic dysfunction, hypothyroidism, hyperlipidemia, bilateral pulmonary embolism, and DVT.  Recent admission for stroke with L side hemiparesis and again for UTI with nausea, vomiting, fever, dysuria and flank pain.  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  On  Mycamine  per ID.   Pertinent Medical History:   Active Ambulatory Problems    Diagnosis Date Noted   Essential hypertension 05/04/2009   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   DM (diabetes mellitus), type 2 (HCC) 11/15/2019   History of stroke 11/15/2019   Tobacco abuse 11/16/2019   Overweight (BMI 25.0-29.9) 11/22/2019   hx of PE/DVT 12/01/2019   Lymphadenopathy 12/28/2020   Sepsis secondary to UTI (HCC) 02/08/2021   Acute renal failure superimposed on stage 3a chronic kidney disease (HCC) 02/09/2021   Chronic respiratory failure with hypoxia (HCC) 02/09/2021   History of thromboembolism 02/09/2021   Type 2 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 disease, with long-term current use of insulin  (HCC) 02/09/2021   Mixed 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 (HCC) 02/09/2021   GERD without esophagitis 02/09/2021   Hypothyroidism 02/09/2021   Grade I diastolic dysfunction 12/02/2021   Mild protein-calorie malnutrition (HCC) 12/02/2021   COPD with acute exacerbation (HCC) 12/02/2021   Allergies    AKI (acute kidney injury) (HCC) 08/30/2022   History of pulmonary embolism 08/30/2022   Hemiparesis affecting left  side as late effect of cerebrovascular accident (CVA) (HCC) 08/30/2022   Sepsis (HCC) 09/07/2022   Sepsis due to urinary tract infection (HCC) 12/13/2022   Hyponatremia 12/13/2022   CKD (chronic kidney disease) stage 3, GFR 30-59 ml/min (HCC) 12/13/2022   Class 1 obesity due to excess calories with body mass index (BMI) of 32.0 to 32.9 in adult 12/18/2022   Dyslipidemia 12/18/2022   UTI (urinary tract infection) 01/21/2023   Leukocytosis 01/21/2023   UTI due to extended-spectrum beta lactamase (ESBL) producing Escherichia coli 09/06/2023   Acute metabolic encephalopathy 09/06/2023   Acute on chronic respiratory failure with hypoxia and hypercapnia (HCC) 09/06/2023   Hypotension 09/06/2023   Aggressive behavior 10/17/2023   Resolved Ambulatory Problems    Diagnosis Date Noted   Bilateral pulmonary embolism (HCC) 11/15/2019   Acute renal failure superimposed on stage 3a chronic kidney disease (HCC) 11/15/2019  Elevated troponin 11/16/2019   Chronic constipation 11/22/2019   Pyelonephritis of right kidney 02/09/2021   Chest pain 05/29/2021   Sepsis due to undetermined organism (HCC) 12/02/2021   Sepsis (HCC) 01/02/2022   Acute pyelonephritis 01/02/2022   UTI (urinary tract infection) 08/30/2022   COPD (chronic obstructive pulmonary disease) (HCC) 09/07/2022   Past Medical History:  Diagnosis Date   Asthma    Bipolar disorder (HCC)    Chronic kidney disease, stage 3a (HCC) 02/09/2021   Diabetes mellitus without complication (HCC)    Hypertension    Pulmonary embolism (HCC) 11/16/2019   Stroke [facility])      Pertinent Ophthalmic History: None     Current Eye Medications: none  Systemic medications on admission:   Medications Prior to Admission  Medication Sig Dispense Refill   acetaminophen  (TYLENOL ) 500 MG tablet Take 1,000 mg by mouth 3 (three) times daily.     albuterol  (VENTOLIN  HFA) 108 (90 Base) MCG/ACT inhaler Inhale 2 puffs into the lungs every 6 (six) hours as needed  for wheezing or shortness of breath. 8 g 1   apixaban  (ELIQUIS ) 5 MG TABS tablet Take 1 tablet (5mg ) twice daily (Patient taking differently: Take 5 mg by mouth 2 (two) times daily.) 60 tablet 0   aspirin  81 MG chewable tablet Chew 81 mg by mouth in the morning.     atenolol  (TENORMIN ) 50 MG tablet Take 50 mg by mouth 2 (two) times daily.     atorvastatin  (LIPITOR ) 80 MG tablet Take 1 tablet (80 mg total) by mouth daily. (Patient taking differently: Take 80 mg by mouth at bedtime.) 30 tablet 5   B Complex-Biotin-FA TABS Take 1 tablet by mouth in the morning.     barrier cream (NON-SPECIFIED) CREA Apply 1 Application topically See admin instructions. Apply to affected areas 2 times a day     chlorhexidine  (PERIDEX ) 0.12 % solution Use as directed 15 mLs in the mouth or throat See admin instructions. Rinse the mouth with 15 ml's once a day as directed, then spit out     Cholecalciferol (VITAMIN D3) 1.25 MG (50000 UT) CAPS Take 50,000 Units by mouth every Thursday.     clonazePAM  (KLONOPIN ) 0.5 MG tablet Take 1 tablet (0.5 mg total) by mouth 2 (two) times daily. 30 tablet 0   cyclobenzaprine  (FLEXERIL ) 10 MG tablet Take 1 tablet (10 mg total) by mouth 3 (three) times daily as needed for muscle spasms. 30 tablet 5   EPINEPHrine (EPIPEN 2-PAK) 0.3 mg/0.3 mL IJ SOAJ injection Inject 0.3 mg into the muscle daily as needed for anaphylaxis.     ezetimibe  (ZETIA ) 10 MG tablet Take 10 mg by mouth at bedtime.     FLUoxetine  (PROZAC ) 20 MG capsule Take 20 mg by mouth daily.     gabapentin  (NEURONTIN ) 400 MG capsule Take 1 capsule (400 mg total) by mouth 3 (three) times daily. 5 capsule 0   hydrALAZINE  (APRESOLINE ) 10 MG tablet Take 10 mg by mouth every 12 (twelve) hours as needed (for a Systolic reading >784 or Diastolic reading >69).     INCRUSE ELLIPTA  62.5 MCG/ACT AEPB Inhale 1 puff into the lungs daily.     insulin  glargine (LANTUS ) 100 UNIT/ML injection Inject 0.1 mLs (10 Units total) into the skin 2 (two)  times daily. (discard vial 28 days after first use) (Patient taking differently: Inject 16 Units into the skin 2 (two) times daily.) 10 mL 11   insulin  lispro (HUMALOG ) 100 UNIT/ML injection Inject 0-14 Units  into the skin See admin instructions. Inject 0-14 units twice daily as per sliding scale : CBG 0-200 : 0 units CBG 201-250 : 2 units CBG 251-300 : 4 units CBG 301-350 : 6 units CBG 351-400 : 8 units CBG 401-450 : 10 units CBG 451-500 : 12 units (if BP reads high, inject 14 units)     LACTOBACILLUS PO Take 1 capsule by mouth daily.     levothyroxine  (SYNTHROID ) 88 MCG tablet Take 88 mcg by mouth in the morning.     losartan  (COZAAR ) 25 MG tablet Take 37.5 mg by mouth daily.     MACROBID  100 MG capsule Take 100 mg by mouth in the morning.     magnesium  oxide (MAG-OX) 400 (240 Mg) MG tablet Take 400 mg by mouth every other day.     Melatonin 5 MG TBDP Take 5 mg by mouth at bedtime.     Omega-3 Fatty Acids (FISH OIL) 1000 MG CAPS Take 1,000 mg by mouth 3 (three) times daily.     omeprazole  (PRILOSEC) 20 MG capsule Take 20 mg by mouth in the morning.     ondansetron  (ZOFRAN ) 4 MG tablet Take 4 mg by mouth 2 (two) times daily.     OXYGEN  Inhale 2 L/min into the lungs as needed (for shortness of breath).     polyethylene glycol powder (GLYCOLAX /MIRALAX ) 17 GM/SCOOP powder Take 17 g by mouth daily.     Propyl Glycol-Hydroxyethylcell (NASAL MOIST) GEL Place 1 application  into both nostrils at bedtime.     QUEtiapine  (SEROQUEL ) 25 MG tablet Take 25 mg by mouth at bedtime.     rOPINIRole  (REQUIP ) 0.25 MG tablet Take 0.25 mg by mouth at bedtime.     SYMBICORT  80-4.5 MCG/ACT inhaler Inhale 2 puffs into the lungs 2 (two) times daily.     TRULICITY  1.5 MG/0.5ML SOAJ Inject 1.5 mg into the skin every Friday.     VOLTAREN  1 % GEL Apply 2 g topically See admin instructions. Apply 2 grams to the posterior neck  2 times a day     witch hazel-glycerin (TUCKS) pad Apply 1 Application topically every 4  (four) hours as needed for hemorrhoids.         ROS: + GU, + resp - COPD, + Neuro - stroke, + Heme - C. Glab in Cx  Visual Fields: FTC OU      Pupils:  Pharmacologically dilated at my direction before exam     Near acuity:  20/40 OD; 20/40 OS estimated     TA:         Normal to palpation OU      Dilation:  both eyes        Medication used  [x  ] NS 2.5% [ x ]Tropicamide   External:   OD:  Normal       OS:  Normal      Anterior segment exam:  By penlight   By slit lap      Conjunctiva:  OD:  Quiet      OS:  Quiet     Cornea:    OD: Clear, no fluorescein stain       OS: Clear, no fluorescein stain      Anterior Chamber:   OD:  Deep/quiet      OS:  Deep/quiet     Iris:    OD:  Normal       OS:  Normal      Lens:    OD:  Clear         OS:  Clear          Optic disc:  OD:  Flat, sharp, pink, healthy      OS:  Flat, sharp, pink, healthy      Central retina--examined with indirect ophthalmoscope:  OD:  Macula and vessels normal; media clear      OS:  Macula and vessels normal; media clear      Peripheral retina--examined with indirect ophthalmoscope with lid speculum and scleral depression:   OD:  Normal, no evidence of fungemia noted in vitreous or retina      OS:  Normal, no evidence of fungemia noted in vitreous or retina    Impression/plan:   1) 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 - Continue treatment regimen according to ID.  2) DMII without retinopathy - annual eye exam   I've discussed these findings with the nurse and/or resident. Please contact our office with any questions or concerns at [phone].   Jearline Minder, MD

## 2023-10-22 NOTE — Plan of Care (Signed)
   Problem: Coping: Goal: Ability to adjust to condition or change in health will improve Outcome: Progressing   Problem: Nutritional: Goal: Maintenance of adequate nutrition will improve Outcome: Progressing   Problem: Skin Integrity: Goal: Risk for impaired skin integrity will decrease Outcome: Progressing   Problem: Tissue Perfusion: Goal: Adequacy of tissue perfusion will improve Outcome: Progressing

## 2023-10-23 ENCOUNTER — Encounter (HOSPITAL_COMMUNITY)
Admission: EM | Disposition: A | Discharge status: Skilled Nursing Facility | Payer: Self-pay | Source: Home / Self Care | Attending: Internal Medicine

## 2023-10-23 ENCOUNTER — Inpatient Hospital Stay (HOSPITAL_COMMUNITY): Payer: Medicaid Other | Admitting: Anesthesiology

## 2023-10-23 ENCOUNTER — Encounter (HOSPITAL_COMMUNITY): Payer: Self-pay | Admitting: Anesthesiology

## 2023-10-23 ENCOUNTER — Inpatient Hospital Stay (HOSPITAL_COMMUNITY): Payer: Medicaid Other

## 2023-10-23 DIAGNOSIS — I340 Nonrheumatic mitral (valve) insufficiency: Secondary | ICD-10-CM

## 2023-10-23 DIAGNOSIS — I8290 Acute embolism and thrombosis of unspecified vein: Secondary | ICD-10-CM | POA: Diagnosis not present

## 2023-10-23 DIAGNOSIS — Z87891 Personal history of nicotine dependence: Secondary | ICD-10-CM

## 2023-10-23 DIAGNOSIS — I10 Essential (primary) hypertension: Secondary | ICD-10-CM | POA: Diagnosis not present

## 2023-10-23 DIAGNOSIS — B49 Unspecified mycosis: Secondary | ICD-10-CM | POA: Diagnosis not present

## 2023-10-23 DIAGNOSIS — I129 Hypertensive chronic kidney disease with stage 1 through stage 4 chronic kidney disease, or unspecified chronic kidney disease: Secondary | ICD-10-CM | POA: Diagnosis not present

## 2023-10-23 DIAGNOSIS — N1831 Chronic kidney disease, stage 3a: Secondary | ICD-10-CM

## 2023-10-23 HISTORY — PX: TRANSESOPHAGEAL ECHOCARDIOGRAM (CATH LAB): EP1270

## 2023-10-23 LAB — GLUCOSE, CAPILLARY
Glucose-Capillary: 176 mg/dL — ABNORMAL HIGH (ref 70–99)
Glucose-Capillary: 151 mg/dL — ABNORMAL HIGH (ref 70–99)
Glucose-Capillary: 128 mg/dL — ABNORMAL HIGH (ref 70–99)
Glucose-Capillary: 243 mg/dL — ABNORMAL HIGH (ref 70–99)

## 2023-10-23 LAB — ECHO TEE

## 2023-10-23 SURGERY — TRANSESOPHAGEAL ECHOCARDIOGRAM (TEE) (CATHLAB)
Anesthesia: Monitor Anesthesia Care

## 2023-10-23 MED ORDER — CYCLOBENZAPRINE HCL 10 MG PO TABS: 10.0000 mg | ORAL_TABLET | Freq: Three times a day (TID) | ORAL | Status: DC | PRN

## 2023-10-23 MED ORDER — PHENYLEPHRINE HCL-NACL 20-0.9 MG/250ML-% IV SOLN
INTRAVENOUS | Status: DC | PRN
  Administered 2023-10-23: 40 ug/min via INTRAVENOUS

## 2023-10-23 MED ORDER — CLONAZEPAM 0.5 MG PO TABS
0.5000 mg | ORAL_TABLET | Freq: Two times a day (BID) | ORAL | Status: DC
  Administered 2023-10-23 – 2023-10-24 (×4): 0.5 mg via ORAL
  Filled 2023-10-23 (×4): qty 1

## 2023-10-23 MED ORDER — PROPOFOL 500 MG/50ML IV EMUL
INTRAVENOUS | Status: DC | PRN
  Administered 2023-10-23: 100 ug/kg/min via INTRAVENOUS

## 2023-10-23 MED ORDER — LIDOCAINE HCL URETHRAL/MUCOSAL 2 % EX GEL
CUTANEOUS | Status: DC | PRN
  Administered 2023-10-23: 1 via TOPICAL

## 2023-10-23 MED ORDER — LIDOCAINE VISCOUS HCL 2 % MT SOLN
OROMUCOSAL | Status: AC
  Filled 2023-10-23: qty 15

## 2023-10-23 MED ORDER — SODIUM CHLORIDE 0.9% FLUSH: 3.0000 mL | Freq: Two times a day (BID) | INTRAVENOUS | Status: DC

## 2023-10-23 MED ORDER — METOCLOPRAMIDE HCL 5 MG/ML IJ SOLN
10.0000 mg | Freq: Once | INTRAMUSCULAR | Status: AC
  Administered 2023-10-23: 10 mg via INTRAVENOUS
  Filled 2023-10-23: qty 2

## 2023-10-23 MED ORDER — SODIUM CHLORIDE 0.9% FLUSH: 3.0000 mL | INTRAVENOUS | Status: DC | PRN

## 2023-10-23 MED ORDER — PROPOFOL 10 MG/ML IV BOLUS
INTRAVENOUS | Status: DC | PRN
  Administered 2023-10-23: 60 mg via INTRAVENOUS

## 2023-10-23 MED ORDER — LIDOCAINE 2% (20 MG/ML) 5 ML SYRINGE
INTRAMUSCULAR | Status: DC | PRN
  Administered 2023-10-23: 100 mg via INTRAVENOUS

## 2023-10-23 MED ORDER — EPHEDRINE SULFATE-NACL 50-0.9 MG/10ML-% IV SOSY
PREFILLED_SYRINGE | INTRAVENOUS | Status: DC | PRN
  Administered 2023-10-23: 5 mg via INTRAVENOUS

## 2023-10-23 MED ORDER — METOCLOPRAMIDE HCL 5 MG PO TABS: 10.0000 mg | ORAL_TABLET | Freq: Three times a day (TID) | ORAL | Status: DC

## 2023-10-23 MED ORDER — SODIUM CHLORIDE 0.9 % IV SOLN: INTRAVENOUS | Status: DC | PRN

## 2023-10-23 NOTE — Progress Notes (Signed)
PROGRESS NOTE    RIONA GELFAND  MVH:[id number] DOB: 07-11-1971 DOA: 10/16/2023 PCP: Pcp, No   Brief Narrative:  53 y.o. female with medical history significant of asthma, seasonal allergies, stage 3a CKD, bipolar disorder, class I obesity, tobacco abuse, COPD with chronic bronchitis and chronic respiratory failure with hypoxia, type II DM, hypertension, GERD, grade 1 diastolic dysfunction, hypothyroidism, hyperlipidemia, bilateral pulmonary embolism, DVT, history of other non-hemorrhagic stroke with residual left-sided hemiparesis and recent admission last month for ESBL E. coli and Klebsiella UTI treated with antibiotics presented with nausea, vomiting, fever, dysuria and suprapubic/flank pain.  On presentation, UA was suggestive of UTI; WBCs of 17, creatinine of 1.44 and lactic acid of 2.2.  Chest x-ray showed no active disease.  CT renal stone study showed no acute findings in the abdomen or pelvis with no nephrolithiasis or obstructive uropathy.  She was started on IV fluids and broad-spectrum antibiotics.  Blood cultures turned positive for Candida glabrata on 10/20/2023: ID was auto consulted; meropenem discontinued and Mycamine was started.  Assessment & Plan:  Fungemia/Sepsis: Present on admission/Lactic acidosis -Presented with fever, lactic acidosis, leukocytosis with possible UTI -Had ESBL E. coli during last admission.  Initially started on meropenem.  Urine cultures had insignificant growth.   Blood culture growing Staph hominis in 1 bottle: Possibly contaminant Blood cultures turned positive for Candida glabrata on 10/20/2023. ID was consulted.  Repeat blood cultures were ordered on 1/13 and negative so far. Patient was started on Mycamine. Echocardiogram did not show any concerning findings.  TEE is being pursued. Seen by ophthalmology and there is no evidence for ocular infection at this time. ID to determine further course of action.  Right renal mass Noted on CT scan.   Underwent MRI in which the renal lesion was poorly evaluated.  Recommendation is to repeat imaging study in a few weeks.  Can be pursued in the outpatient setting.  Diabetes mellitus type 2 with hyperglycemia Peripheral neuropathy -Carb modified diet.  Continue long-acting insulin along with CBGs with SSI.  Continue gabapentin.  CBGs are reasonably well-controlled.  HbA1c 10.3 in November 2024.  Chronic respiratory failure with hypoxia/COPD On home oxygen.  Stable. Outpatient follow-up with pulmonary  Essential hypertension Continue losartan and atenolol.  Blood pressure is reasonably well-controlled with occasional high readings.  History of unspecified CVA with left-sided hemiparesis/Hyperlipidemia -Continue supportive care.  Outpatient follow-up with neurology.  Continue ezetimibe/statin.  PT recommended no PT follow-up  History of PE/DVT -Continue Eliquis  Grade 1 diastolic dysfunction -Continue beta-blocker and losartan   CKD stage IIIa -Creatinine currently stable.  Monitor  Anxiety/depression -Continue fluoxetine  Prolonged QT -Monitor on telemetry.  Avoid QT prolonging meds as much as possible  Obesity Estimated body mass index is 31.97 kg/m as calculated from the following:   Height as of this encounter: 5' 7.5" (1.715 m).   Weight as of this encounter: 94 kg.  Leukocytosis -Resolved  DVT prophylaxis:  Eliquis Code Status: Full Family Communication: None at bedside Disposition Plan: From SNF.  Discharge when cleared by infectious diseases.  Consultants: ID  Procedures: None  Antimicrobials:  Anti-infectives (From admission, onward)    Start     Dose/Rate Route Frequency Ordered Stop   10/20/23 2200  nitrofurantoin (macrocrystal-monohydrate) (MACROBID) capsule 100 mg        100 mg Oral Daily at bedtime 10/20/23 1340     10/20/23 1115  micafungin (MYCAMINE) 100 mg in sodium chloride 0.9 % 100 mL IVPB  100 mg 105 mL/hr over 1 Hours Intravenous Daily  10/20/23 1020     10/18/23 0600  cefTRIAXone (ROCEPHIN) 2 g in sodium chloride 0.9 % 100 mL IVPB  Status:  Discontinued        2 g 200 mL/hr over 30 Minutes Intravenous Every 24 hours 10/17/23 0747 10/17/23 0902   10/17/23 0900  meropenem (MERREM) 1 g in sodium chloride 0.9 % 100 mL IVPB  Status:  Discontinued        1 g 200 mL/hr over 30 Minutes Intravenous Every 8 hours 10/17/23 0858 10/20/23 1043   10/17/23 0145  cefTRIAXone (ROCEPHIN) 2 g in sodium chloride 0.9 % 100 mL IVPB        2 g 200 mL/hr over 30 Minutes Intravenous Once 10/17/23 0132 10/17/23 0305      Subjective: Patient has usual complains of back pain which is chronic for her.  No new complaints offered.  Objective: Vitals:   10/22/23 2100 10/23/23 0619 10/23/23 0810 10/23/23 0931  BP: 136/76 (!) 157/95  (!) 161/88  Pulse: 77 66  66  Resp: 18 20    Temp: 98.2 F (36.8 C) 97.9 F (36.6 C)    TempSrc: Oral Oral    SpO2: 95% 92% 92% 93%  Weight:      Height:       No intake or output data in the 24 hours ending 10/23/23 1126  Filed Weights   10/17/23 1628  Weight: 94 kg     Examination:  General appearance: Awake alert.  In no distress Resp: Clear to auscultation bilaterally.  Normal effort Cardio: S1-S2 is normal regular.  No S3-S4.  No rubs murmurs or bruit GI: Abdomen is soft.  Nontender nondistended.  Bowel sounds are present normal.  No masses organomegaly   Data Reviewed: I have personally reviewed following labs and imaging studies  CBC: Recent Labs  Lab 10/17/23 0107 10/17/23 1948 10/18/23 0521 10/19/23 0448 10/22/23 0411  WBC 17.0*  --  10.1 7.2 7.3  NEUTROABS  --   --  6.2 3.8 3.6  HGB 13.2 11.4* 10.7* 10.2* 11.7*  HCT 41.1 34.8* 34.6* 32.3* 36.9  MCV 86.3  --  88.7 87.5 85.4  PLT 232  --  166 180 249   Basic Metabolic Panel: Recent Labs  Lab 10/17/23 0107 10/18/23 0521 10/19/23 0448 10/20/23 0422 10/22/23 0411  NA 130* 136 137 137 137  K 5.0 4.3 4.2 4.3 4.0  CL 96* 100  100 96* 98  CO2 25 28 28 29 28   GLUCOSE 271* 163* 181* 152* 206*  BUN 25* 21* 15 12 18   CREATININE 1.44* 1.41* 1.10* 1.02* 0.93  CALCIUM 8.9 8.7* 8.8* 9.4 9.2  MG  --   --  1.9 1.8 2.4   GFR: Estimated Creatinine Clearance: 85.1 mL/min (by C-G formula based on SCr of 0.93 mg/dL).  Liver Function Tests: Recent Labs  Lab 10/17/23 0107 10/18/23 0521  AST 22 14*  ALT 14 11  ALKPHOS 64 47  BILITOT 0.7 0.8  PROT 7.6 5.9*  ALBUMIN 4.0 2.6*   Recent Labs  Lab 10/17/23 0107  LIPASE 22    Coagulation Profile: Recent Labs  Lab 10/17/23 0107  INR 1.6*    CBG: Recent Labs  Lab 10/22/23 0739 10/22/23 1208 10/22/23 1714 10/22/23 2034 10/23/23 0741  GLUCAP 151* 162* 240* 220* 176*    Sepsis Labs: Recent Labs  Lab 10/17/23 0125 10/17/23 0302 10/17/23 2230  LATICACIDVEN 2.2* 2.2* 0.9  Recent Results (from the past 240 hours)  Resp panel by RT-PCR (RSV, Flu A&B, Covid) Anterior Nasal Swab     Status: None   Collection Time: 10/16/23 11:17 PM   Specimen: Anterior Nasal Swab  Result Value Ref Range Status   SARS Coronavirus 2 by RT PCR NEGATIVE NEGATIVE Final    Comment: (NOTE) SARS-CoV-2 target nucleic acids are NOT DETECTED.  The SARS-CoV-2 RNA is generally detectable in upper respiratory specimens during the acute phase of infection. The lowest concentration of SARS-CoV-2 viral copies this assay can detect is 138 copies/mL. A negative result does not preclude SARS-Cov-2 infection and should not be used as the sole basis for treatment or other patient management decisions. A negative result may occur with  improper specimen collection/handling, submission of specimen other than nasopharyngeal swab, presence of viral mutation(s) within the areas targeted by this assay, and inadequate number of viral copies(<138 copies/mL). A negative result must be combined with clinical observations, patient history, and epidemiological information. The expected result is  Negative.  Fact Sheet for Patients:  BloggerCourse.com  Fact Sheet for Healthcare Providers:  SeriousBroker.it  This test is no t yet approved or cleared by the [place] FDA and  has been authorized for detection and/or diagnosis of SARS-CoV-2 by FDA under an Emergency Use Authorization (EUA). This EUA will remain  in effect (meaning this test can be used) for the duration of the COVID-19 declaration under Section 564(b)(1) of the Act, 21 U.S.C.section 360bbb-3(b)(1), unless the authorization is terminated  or revoked sooner.       Influenza A by PCR NEGATIVE NEGATIVE Final   Influenza B by PCR NEGATIVE NEGATIVE Final    Comment: (NOTE) The Xpert Xpress SARS-CoV-2/FLU/RSV plus assay is intended as an aid in the diagnosis of influenza from Nasopharyngeal swab specimens and should not be used as a sole basis for treatment. Nasal washings and aspirates are unacceptable for Xpert Xpress SARS-CoV-2/FLU/RSV testing.  Fact Sheet for Patients: BloggerCourse.com  Fact Sheet for Healthcare Providers: SeriousBroker.it  This test is not yet approved or cleared by the [place] FDA and has been authorized for detection and/or diagnosis of SARS-CoV-2 by FDA under an Emergency Use Authorization (EUA). This EUA will remain in effect (meaning this test can be used) for the duration of the COVID-19 declaration under Section 564(b)(1) of the Act, 21 U.S.C. section 360bbb-3(b)(1), unless the authorization is terminated or revoked.     Resp Syncytial Virus by PCR NEGATIVE NEGATIVE Final    Comment: (NOTE) Fact Sheet for Patients: BloggerCourse.com  Fact Sheet for Healthcare Providers: SeriousBroker.it  This test is not yet approved or cleared by the [place] FDA and has been authorized for detection and/or diagnosis of  SARS-CoV-2 by FDA under an Emergency Use Authorization (EUA). This EUA will remain in effect (meaning this test can be used) for the duration of the COVID-19 declaration under Section 564(b)(1) of the Act, 21 U.S.C. section 360bbb-3(b)(1), unless the authorization is terminated or revoked.  Performed at [facility], [address], [place], [place] [address]   Urine Culture     Status: Abnormal   Collection Time: 10/17/23  1:07 AM   Specimen: Urine, Random  Result Value Ref Range Status   Specimen Description   Final    URINE, RANDOM Performed at [facility], [address], [place], [place] [address]    Special Requests   Final    NONE Reflexed from [id number] Performed at [facility], [address], [place], [place] [address]    Culture (A)  Final    <10,000 COLONIES/mL INSIGNIFICANT GROWTH Performed at [facility], [address] [place], [place] [address]    Report Status 10/18/2023 FINAL  Final  Blood Culture (routine x 2)     Status: Abnormal (Preliminary result)   Collection Time: 10/17/23  1:18 AM   Specimen: BLOOD LEFT FOREARM  Result Value Ref Range Status   Specimen Description   Final    BLOOD LEFT FOREARM Performed at [facility], [address], [place], [place] [address]    Special Requests   Final    BOTTLES DRAWN AEROBIC AND ANAEROBIC Blood Culture adequate volume Performed at [facility], [address] [place], [place] [address]    Culture  Setup Time   Final    GRAM POSITIVE COCCI ANAEROBIC BOTTLE ONLY CRITICAL RESULT CALLED TO, READ BACK BY AND VERIFIED WITH: PHARMD E JACKSON 10/18/2023 @ 0003 BY AB YEAST AEROBIC BOTTLE ONLY CRITICAL RESULT CALLED TO, READ BACK BY AND VERIFIED WITH: PHARMD C DAVIS [id number] AT 1137 BY CM    Culture (A)  Final    STAPHYLOCOCCUS HOMINIS THE SIGNIFICANCE OF ISOLATING THIS ORGANISM FROM A SINGLE SET OF BLOOD  CULTURES WHEN MULTIPLE SETS ARE DRAWN IS UNCERTAIN. PLEASE NOTIFY THE MICROBIOLOGY DEPARTMENT WITHIN ONE WEEK IF SPECIATION AND SENSITIVITIES ARE REQUIRED. CANDIDA GLABRATA Sent to [facility] for further susceptibility testing. Performed at [facility], [address] [place], [place] [address]    Report Status PENDING  Incomplete  Blood Culture (routine x 2)     Status: None   Collection Time: 10/17/23  1:18 AM   Specimen: BLOOD RIGHT FOREARM  Result Value Ref Range Status   Specimen Description   Final    BLOOD RIGHT FOREARM Performed at [facility], [address] [place], [place] [address]    Special Requests   Final    BOTTLES DRAWN AEROBIC AND ANAEROBIC Blood Culture adequate volume Performed at [facility], [address] [place], [place] [address]    Culture   Final    NO GROWTH 5 DAYS Performed at [facility], [address] [place], [place] [address]    Report Status 10/22/2023 FINAL  Final  Blood Culture ID Panel (Reflexed)     Status: Abnormal   Collection Time: 10/17/23  1:18 AM  Result Value Ref Range Status   Enterococcus faecalis NOT DETECTED NOT DETECTED Final   Enterococcus Faecium NOT DETECTED NOT DETECTED Final   Listeria monocytogenes NOT DETECTED NOT DETECTED Final   Staphylococcus species DETECTED (A) NOT DETECTED Final    Comment: CRITICAL RESULT CALLED TO, READ BACK BY AND VERIFIED WITH: PHARMD E JACKSON 10/18/2023 @ 0003 BY AB    Staphylococcus aureus (BCID) NOT DETECTED NOT DETECTED Final   Staphylococcus epidermidis NOT DETECTED NOT DETECTED Final   Staphylococcus lugdunensis NOT DETECTED NOT DETECTED Final   Streptococcus species NOT DETECTED NOT DETECTED Final   Streptococcus agalactiae NOT DETECTED NOT DETECTED Final   Streptococcus pneumoniae NOT DETECTED NOT DETECTED Final   Streptococcus pyogenes NOT DETECTED NOT DETECTED Final   A.calcoaceticus-baumannii NOT DETECTED NOT DETECTED Final    Bacteroides fragilis NOT DETECTED NOT DETECTED Final   Enterobacterales NOT DETECTED NOT DETECTED Final   Enterobacter cloacae complex NOT DETECTED NOT DETECTED Final   Escherichia coli NOT DETECTED NOT DETECTED Final   Klebsiella aerogenes NOT DETECTED  NOT DETECTED Final   Klebsiella oxytoca NOT DETECTED NOT DETECTED Final   Klebsiella pneumoniae NOT DETECTED NOT DETECTED Final   Proteus species NOT DETECTED NOT DETECTED Final   Salmonella species NOT DETECTED NOT DETECTED Final   Serratia marcescens NOT DETECTED NOT DETECTED Final   Haemophilus influenzae NOT DETECTED NOT DETECTED Final   Neisseria meningitidis NOT DETECTED NOT DETECTED Final   Pseudomonas aeruginosa NOT DETECTED NOT DETECTED Final   Stenotrophomonas maltophilia NOT DETECTED NOT DETECTED Final   Candida albicans NOT DETECTED NOT DETECTED Final   Candida auris NOT DETECTED NOT DETECTED Final   Candida glabrata NOT DETECTED NOT DETECTED Final   Candida krusei NOT DETECTED NOT DETECTED Final   Candida parapsilosis NOT DETECTED NOT DETECTED Final   Candida tropicalis NOT DETECTED NOT DETECTED Final   Cryptococcus neoformans/gattii NOT DETECTED NOT DETECTED Final    Comment: Performed at [facility], [address], [place], [place] [address]  Blood Culture ID Panel (Reflexed)     Status: Abnormal   Collection Time: 10/17/23  1:18 AM  Result Value Ref Range Status   Enterococcus faecalis NOT DETECTED NOT DETECTED Final   Enterococcus Faecium NOT DETECTED NOT DETECTED Final   Listeria monocytogenes NOT DETECTED NOT DETECTED Final   Staphylococcus species NOT DETECTED NOT DETECTED Final   Staphylococcus aureus (BCID) NOT DETECTED NOT DETECTED Final   Staphylococcus epidermidis NOT DETECTED NOT DETECTED Final   Staphylococcus lugdunensis NOT DETECTED NOT DETECTED Final   Streptococcus species NOT DETECTED NOT DETECTED Final   Streptococcus agalactiae NOT DETECTED NOT DETECTED Final   Streptococcus pneumoniae NOT  DETECTED NOT DETECTED Final   Streptococcus pyogenes NOT DETECTED NOT DETECTED Final   A.calcoaceticus-baumannii NOT DETECTED NOT DETECTED Final   Bacteroides fragilis NOT DETECTED NOT DETECTED Final   Enterobacterales NOT DETECTED NOT DETECTED Final   Enterobacter cloacae complex NOT DETECTED NOT DETECTED Final   Escherichia coli NOT DETECTED NOT DETECTED Final   Klebsiella aerogenes NOT DETECTED NOT DETECTED Final   Klebsiella oxytoca NOT DETECTED NOT DETECTED Final   Klebsiella pneumoniae NOT DETECTED NOT DETECTED Final   Proteus species NOT DETECTED NOT DETECTED Final   Salmonella species NOT DETECTED NOT DETECTED Final   Serratia marcescens NOT DETECTED NOT DETECTED Final   Haemophilus influenzae NOT DETECTED NOT DETECTED Final   Neisseria meningitidis NOT DETECTED NOT DETECTED Final   Pseudomonas aeruginosa NOT DETECTED NOT DETECTED Final   Stenotrophomonas maltophilia NOT DETECTED NOT DETECTED Final   Candida albicans NOT DETECTED NOT DETECTED Final   Candida auris NOT DETECTED NOT DETECTED Final   Candida glabrata DETECTED (A) NOT DETECTED Final    Comment: CRITICAL RESULT CALLED TO, READ BACK BY AND VERIFIED WITH: PHARMD C DAVIS [id number] AT 1137 BY CM    Candida krusei NOT DETECTED NOT DETECTED Final   Candida parapsilosis NOT DETECTED NOT DETECTED Final   Candida tropicalis NOT DETECTED NOT DETECTED Final   Cryptococcus neoformans/gattii NOT DETECTED NOT DETECTED Final    Comment: Performed at [facility], [address], [place], [place] [address]  Culture, blood (Routine X 2) w Reflex to ID Panel     Status: None (Preliminary result)   Collection Time: 10/20/23  2:06 PM   Specimen: BLOOD  Result Value Ref Range Status   Specimen Description   Final    BLOOD SITE NOT SPECIFIED Performed at [facility], [address], [place], [place] [address]    Special Requests   Final  BOTTLES DRAWN AEROBIC ONLY Blood Culture results may not be optimal  due to an inadequate volume of blood received in culture bottles Performed at [facility], [address] [place], [place] [address]    Culture   Final    NO GROWTH 3 DAYS Performed at [facility], [address] [place], [place] [address]    Report Status PENDING  Incomplete  Culture, blood (Routine X 2) w Reflex to ID Panel     Status: None (Preliminary result)   Collection Time: 10/20/23  2:14 PM   Specimen: BLOOD  Result Value Ref Range Status   Specimen Description   Final    BLOOD SITE NOT SPECIFIED Performed at [facility], [address], [place], [place] [address]    Special Requests   Final    BOTTLES DRAWN AEROBIC ONLY Blood Culture results may not be optimal due to an inadequate volume of blood received in culture bottles Performed at [facility], [address], [place], [place] [address]    Culture   Final    NO GROWTH 3 DAYS Performed at [facility], [address] [place], [place] [address]    Report Status PENDING  Incomplete         Radiology Studies: MR ABDOMEN W WO CONTRAST Result Date: 10/21/2023 CLINICAL DATA:  Renal mass on CT EXAM: MRI ABDOMEN WITHOUT AND WITH CONTRAST TECHNIQUE: Multiplanar multisequence MR imaging of the abdomen was performed both before and after the administration of intravenous contrast. CONTRAST:  9mL GADAVIST GADOBUTROL 1 MMOL/ML IV SOLN COMPARISON:  CT abdomen/pelvis dated 10/20/2023 FINDINGS: Motion degraded images. Lower chest: Lung bases are clear. Hepatobiliary: Mild hepatic steatosis. Liver is otherwise within normal limits. Gallbladder is unremarkable. No intrahepatic or extrahepatic duct dilatation. Pancreas:  Within normal limits. Spleen:  Within normal limits. Adrenals/Urinary Tract:  Adrenal glands are within normal limits. Left kidney is within normal limits. Right renal cortical scarring/lobulation. Given motion degradation, the rounded area on CT  is poorly evaluated, but mild restricted diffusion is suspected (series 6/image 22) with subtle differential enhancement (series 11/image 50). As such, small solid renal neoplasm is difficult to exclude, but not well characterized on the current study. For reference, this lesion measures approximately 13 mm on MR (series 3/image 24). No hydronephrosis. Stomach/Bowel: Stomach is within normal limits. Visualized bowel is unremarkable. Vascular/Lymphatic:  No evidence of abdominal aortic aneurysm. No suspicious abdominal lymphadenopathy. Other:  No abdominal ascites. Musculoskeletal: No focal osseous lesions. IMPRESSION: Motion degraded images. 13 mm right renal lesion is poorly evaluated, but small solid renal neoplasm is difficult to exclude, although not well characterized on the current study. Consider follow-up CT or MRI abdomen with/without contrast in 4-6 weeks. Mild hepatic steatosis. Electronically Signed   By: Charline Bills M.D.   On: 10/21/2023 23:26     Scheduled Meds:  apixaban  5 mg Oral BID   atenolol  50 mg Oral BID   atorvastatin  80 mg Oral QHS   clonazePAM  0.5 mg Oral BID   ezetimibe  10 mg Oral QHS   FLUoxetine  20 mg Oral Daily   gabapentin  400 mg Oral TID   insulin aspart  0-15 Units Subcutaneous TID WC   insulin glargine-yfgn  10 Units Subcutaneous QHS   levothyroxine  88 mcg Oral Q0600   losartan  37.5 mg Oral Daily   magnesium oxide  400 mg Oral QODAY   melatonin  5  mg Oral QHS   metoCLOPramide  10 mg Oral TID AC & HS   mometasone-formoterol  2 puff Inhalation BID   nitrofurantoin (macrocrystal-monohydrate)  100 mg Oral QHS   pantoprazole  20 mg Oral Daily   phenylephrine  1 drop Both Eyes 3 times per day on Wednesday   QUEtiapine  25 mg Oral QHS   rOPINIRole  0.25 mg Oral QHS   senna-docusate  1 tablet Oral BID   tropicamide  1 drop Both Eyes 3 times per day on Wednesday   umeclidinium bromide  1 puff Inhalation Daily   Continuous Infusions:  micafungin  (MYCAMINE) 100 mg in sodium chloride 0.9 % 100 mL IVPB 100 mg (10/23/23 0950)    Osvaldo Shipper, MD [facility] 10/23/2023, 11:26 AM

## 2023-10-23 NOTE — H&P (View-Only) (Signed)
PROGRESS NOTE    RIONA GELFAND  MVH:[id number] DOB: 07-11-1971 DOA: 10/16/2023 PCP: Pcp, No   Brief Narrative:  53 y.o. female with medical history significant of asthma, seasonal allergies, stage 3a CKD, bipolar disorder, class I obesity, tobacco abuse, COPD with chronic bronchitis and chronic respiratory failure with hypoxia, type II DM, hypertension, GERD, grade 1 diastolic dysfunction, hypothyroidism, hyperlipidemia, bilateral pulmonary embolism, DVT, history of other non-hemorrhagic stroke with residual left-sided hemiparesis and recent admission last month for ESBL E. coli and Klebsiella UTI treated with antibiotics presented with nausea, vomiting, fever, dysuria and suprapubic/flank pain.  On presentation, UA was suggestive of UTI; WBCs of 17, creatinine of 1.44 and lactic acid of 2.2.  Chest x-ray showed no active disease.  CT renal stone study showed no acute findings in the abdomen or pelvis with no nephrolithiasis or obstructive uropathy.  She was started on IV fluids and broad-spectrum antibiotics.  Blood cultures turned positive for Candida glabrata on 10/20/2023: ID was auto consulted; meropenem discontinued and Mycamine was started.  Assessment & Plan:  Fungemia/Sepsis: Present on admission/Lactic acidosis -Presented with fever, lactic acidosis, leukocytosis with possible UTI -Had ESBL E. coli during last admission.  Initially started on meropenem.  Urine cultures had insignificant growth.   Blood culture growing Staph hominis in 1 bottle: Possibly contaminant Blood cultures turned positive for Candida glabrata on 10/20/2023. ID was consulted.  Repeat blood cultures were ordered on 1/13 and negative so far. Patient was started on Mycamine. Echocardiogram did not show any concerning findings.  TEE is being pursued. Seen by ophthalmology and there is no evidence for ocular infection at this time. ID to determine further course of action.  Right renal mass Noted on CT scan.   Underwent MRI in which the renal lesion was poorly evaluated.  Recommendation is to repeat imaging study in a few weeks.  Can be pursued in the outpatient setting.  Diabetes mellitus type 2 with hyperglycemia Peripheral neuropathy -Carb modified diet.  Continue long-acting insulin along with CBGs with SSI.  Continue gabapentin.  CBGs are reasonably well-controlled.  HbA1c 10.3 in November 2024.  Chronic respiratory failure with hypoxia/COPD On home oxygen.  Stable. Outpatient follow-up with pulmonary  Essential hypertension Continue losartan and atenolol.  Blood pressure is reasonably well-controlled with occasional high readings.  History of unspecified CVA with left-sided hemiparesis/Hyperlipidemia -Continue supportive care.  Outpatient follow-up with neurology.  Continue ezetimibe/statin.  PT recommended no PT follow-up  History of PE/DVT -Continue Eliquis  Grade 1 diastolic dysfunction -Continue beta-blocker and losartan   CKD stage IIIa -Creatinine currently stable.  Monitor  Anxiety/depression -Continue fluoxetine  Prolonged QT -Monitor on telemetry.  Avoid QT prolonging meds as much as possible  Obesity Estimated body mass index is 31.97 kg/m as calculated from the following:   Height as of this encounter: 5' 7.5" (1.715 m).   Weight as of this encounter: 94 kg.  Leukocytosis -Resolved  DVT prophylaxis:  Eliquis Code Status: Full Family Communication: None at bedside Disposition Plan: From SNF.  Discharge when cleared by infectious diseases.  Consultants: ID  Procedures: None  Antimicrobials:  Anti-infectives (From admission, onward)    Start     Dose/Rate Route Frequency Ordered Stop   10/20/23 2200  nitrofurantoin (macrocrystal-monohydrate) (MACROBID) capsule 100 mg        100 mg Oral Daily at bedtime 10/20/23 1340     10/20/23 1115  micafungin (MYCAMINE) 100 mg in sodium chloride 0.9 % 100 mL IVPB  100 mg 105 mL/hr over 1 Hours Intravenous Daily  10/20/23 1020     10/18/23 0600  cefTRIAXone (ROCEPHIN) 2 g in sodium chloride 0.9 % 100 mL IVPB  Status:  Discontinued        2 g 200 mL/hr over 30 Minutes Intravenous Every 24 hours 10/17/23 0747 10/17/23 0902   10/17/23 0900  meropenem (MERREM) 1 g in sodium chloride 0.9 % 100 mL IVPB  Status:  Discontinued        1 g 200 mL/hr over 30 Minutes Intravenous Every 8 hours 10/17/23 0858 10/20/23 1043   10/17/23 0145  cefTRIAXone (ROCEPHIN) 2 g in sodium chloride 0.9 % 100 mL IVPB        2 g 200 mL/hr over 30 Minutes Intravenous Once 10/17/23 0132 10/17/23 0305      Subjective: Patient has usual complains of back pain which is chronic for her.  No new complaints offered.  Objective: Vitals:   10/22/23 2100 10/23/23 0619 10/23/23 0810 10/23/23 0931  BP: 136/76 (!) 157/95  (!) 161/88  Pulse: 77 66  66  Resp: 18 20    Temp: 98.2 F (36.8 C) 97.9 F (36.6 C)    TempSrc: Oral Oral    SpO2: 95% 92% 92% 93%  Weight:      Height:       No intake or output data in the 24 hours ending 10/23/23 1126  Filed Weights   10/17/23 1628  Weight: 94 kg     Examination:  General appearance: Awake alert.  In no distress Resp: Clear to auscultation bilaterally.  Normal effort Cardio: S1-S2 is normal regular.  No S3-S4.  No rubs murmurs or bruit GI: Abdomen is soft.  Nontender nondistended.  Bowel sounds are present normal.  No masses organomegaly   Data Reviewed: I have personally reviewed following labs and imaging studies  CBC: Recent Labs  Lab 10/17/23 0107 10/17/23 1948 10/18/23 0521 10/19/23 0448 10/22/23 0411  WBC 17.0*  --  10.1 7.2 7.3  NEUTROABS  --   --  6.2 3.8 3.6  HGB 13.2 11.4* 10.7* 10.2* 11.7*  HCT 41.1 34.8* 34.6* 32.3* 36.9  MCV 86.3  --  88.7 87.5 85.4  PLT 232  --  166 180 249   Basic Metabolic Panel: Recent Labs  Lab 10/17/23 0107 10/18/23 0521 10/19/23 0448 10/20/23 0422 10/22/23 0411  NA 130* 136 137 137 137  K 5.0 4.3 4.2 4.3 4.0  CL 96* 100  100 96* 98  CO2 25 28 28 29 28   GLUCOSE 271* 163* 181* 152* 206*  BUN 25* 21* 15 12 18   CREATININE 1.44* 1.41* 1.10* 1.02* 0.93  CALCIUM 8.9 8.7* 8.8* 9.4 9.2  MG  --   --  1.9 1.8 2.4   GFR: Estimated Creatinine Clearance: 85.1 mL/min (by C-G formula based on SCr of 0.93 mg/dL).  Liver Function Tests: Recent Labs  Lab 10/17/23 0107 10/18/23 0521  AST 22 14*  ALT 14 11  ALKPHOS 64 47  BILITOT 0.7 0.8  PROT 7.6 5.9*  ALBUMIN 4.0 2.6*   Recent Labs  Lab 10/17/23 0107  LIPASE 22    Coagulation Profile: Recent Labs  Lab 10/17/23 0107  INR 1.6*    CBG: Recent Labs  Lab 10/22/23 0739 10/22/23 1208 10/22/23 1714 10/22/23 2034 10/23/23 0741  GLUCAP 151* 162* 240* 220* 176*    Sepsis Labs: Recent Labs  Lab 10/17/23 0125 10/17/23 0302 10/17/23 2230  LATICACIDVEN 2.2* 2.2* 0.9  Recent Results (from the past 240 hours)  Resp panel by RT-PCR (RSV, Flu A&B, Covid) Anterior Nasal Swab     Status: None   Collection Time: 10/16/23 11:17 PM   Specimen: Anterior Nasal Swab  Result Value Ref Range Status   SARS Coronavirus 2 by RT PCR NEGATIVE NEGATIVE Final    Comment: (NOTE) SARS-CoV-2 target nucleic acids are NOT DETECTED.  The SARS-CoV-2 RNA is generally detectable in upper respiratory specimens during the acute phase of infection. The lowest concentration of SARS-CoV-2 viral copies this assay can detect is 138 copies/mL. A negative result does not preclude SARS-Cov-2 infection and should not be used as the sole basis for treatment or other patient management decisions. A negative result may occur with  improper specimen collection/handling, submission of specimen other than nasopharyngeal swab, presence of viral mutation(s) within the areas targeted by this assay, and inadequate number of viral copies(<138 copies/mL). A negative result must be combined with clinical observations, patient history, and epidemiological information. The expected result is  Negative.  Fact Sheet for Patients:  BloggerCourse.com  Fact Sheet for Healthcare Providers:  SeriousBroker.it  This test is no t yet approved or cleared by the [place] FDA and  has been authorized for detection and/or diagnosis of SARS-CoV-2 by FDA under an Emergency Use Authorization (EUA). This EUA will remain  in effect (meaning this test can be used) for the duration of the COVID-19 declaration under Section 564(b)(1) of the Act, 21 U.S.C.section 360bbb-3(b)(1), unless the authorization is terminated  or revoked sooner.       Influenza A by PCR NEGATIVE NEGATIVE Final   Influenza B by PCR NEGATIVE NEGATIVE Final    Comment: (NOTE) The Xpert Xpress SARS-CoV-2/FLU/RSV plus assay is intended as an aid in the diagnosis of influenza from Nasopharyngeal swab specimens and should not be used as a sole basis for treatment. Nasal washings and aspirates are unacceptable for Xpert Xpress SARS-CoV-2/FLU/RSV testing.  Fact Sheet for Patients: BloggerCourse.com  Fact Sheet for Healthcare Providers: SeriousBroker.it  This test is not yet approved or cleared by the [place] FDA and has been authorized for detection and/or diagnosis of SARS-CoV-2 by FDA under an Emergency Use Authorization (EUA). This EUA will remain in effect (meaning this test can be used) for the duration of the COVID-19 declaration under Section 564(b)(1) of the Act, 21 U.S.C. section 360bbb-3(b)(1), unless the authorization is terminated or revoked.     Resp Syncytial Virus by PCR NEGATIVE NEGATIVE Final    Comment: (NOTE) Fact Sheet for Patients: BloggerCourse.com  Fact Sheet for Healthcare Providers: SeriousBroker.it  This test is not yet approved or cleared by the [place] FDA and has been authorized for detection and/or diagnosis of  SARS-CoV-2 by FDA under an Emergency Use Authorization (EUA). This EUA will remain in effect (meaning this test can be used) for the duration of the COVID-19 declaration under Section 564(b)(1) of the Act, 21 U.S.C. section 360bbb-3(b)(1), unless the authorization is terminated or revoked.  Performed at [facility], [address], [place], [place] [address]   Urine Culture     Status: Abnormal   Collection Time: 10/17/23  1:07 AM   Specimen: Urine, Random  Result Value Ref Range Status   Specimen Description   Final    URINE, RANDOM Performed at [facility], [address], [place], [place] [address]    Special Requests   Final    NONE Reflexed from [id number] Performed at [facility], [address], [place], [place] [address]    Culture (A)  Final    <10,000 COLONIES/mL INSIGNIFICANT GROWTH Performed at [facility], [address] [place], [place] [address]    Report Status 10/18/2023 FINAL  Final  Blood Culture (routine x 2)     Status: Abnormal (Preliminary result)   Collection Time: 10/17/23  1:18 AM   Specimen: BLOOD LEFT FOREARM  Result Value Ref Range Status   Specimen Description   Final    BLOOD LEFT FOREARM Performed at [facility], [address], [place], [place] [address]    Special Requests   Final    BOTTLES DRAWN AEROBIC AND ANAEROBIC Blood Culture adequate volume Performed at [facility], [address] [place], [place] [address]    Culture  Setup Time   Final    GRAM POSITIVE COCCI ANAEROBIC BOTTLE ONLY CRITICAL RESULT CALLED TO, READ BACK BY AND VERIFIED WITH: PHARMD E JACKSON 10/18/2023 @ 0003 BY AB YEAST AEROBIC BOTTLE ONLY CRITICAL RESULT CALLED TO, READ BACK BY AND VERIFIED WITH: PHARMD C DAVIS [id number] AT 1137 BY CM    Culture (A)  Final    STAPHYLOCOCCUS HOMINIS THE SIGNIFICANCE OF ISOLATING THIS ORGANISM FROM A SINGLE SET OF BLOOD  CULTURES WHEN MULTIPLE SETS ARE DRAWN IS UNCERTAIN. PLEASE NOTIFY THE MICROBIOLOGY DEPARTMENT WITHIN ONE WEEK IF SPECIATION AND SENSITIVITIES ARE REQUIRED. CANDIDA GLABRATA Sent to [facility] for further susceptibility testing. Performed at [facility], [address] [place], [place] [address]    Report Status PENDING  Incomplete  Blood Culture (routine x 2)     Status: None   Collection Time: 10/17/23  1:18 AM   Specimen: BLOOD RIGHT FOREARM  Result Value Ref Range Status   Specimen Description   Final    BLOOD RIGHT FOREARM Performed at [facility], [address] [place], [place] [address]    Special Requests   Final    BOTTLES DRAWN AEROBIC AND ANAEROBIC Blood Culture adequate volume Performed at [facility], [address] [place], [place] [address]    Culture   Final    NO GROWTH 5 DAYS Performed at [facility], [address] [place], [place] [address]    Report Status 10/22/2023 FINAL  Final  Blood Culture ID Panel (Reflexed)     Status: Abnormal   Collection Time: 10/17/23  1:18 AM  Result Value Ref Range Status   Enterococcus faecalis NOT DETECTED NOT DETECTED Final   Enterococcus Faecium NOT DETECTED NOT DETECTED Final   Listeria monocytogenes NOT DETECTED NOT DETECTED Final   Staphylococcus species DETECTED (A) NOT DETECTED Final    Comment: CRITICAL RESULT CALLED TO, READ BACK BY AND VERIFIED WITH: PHARMD E JACKSON 10/18/2023 @ 0003 BY AB    Staphylococcus aureus (BCID) NOT DETECTED NOT DETECTED Final   Staphylococcus epidermidis NOT DETECTED NOT DETECTED Final   Staphylococcus lugdunensis NOT DETECTED NOT DETECTED Final   Streptococcus species NOT DETECTED NOT DETECTED Final   Streptococcus agalactiae NOT DETECTED NOT DETECTED Final   Streptococcus pneumoniae NOT DETECTED NOT DETECTED Final   Streptococcus pyogenes NOT DETECTED NOT DETECTED Final   A.calcoaceticus-baumannii NOT DETECTED NOT DETECTED Final    Bacteroides fragilis NOT DETECTED NOT DETECTED Final   Enterobacterales NOT DETECTED NOT DETECTED Final   Enterobacter cloacae complex NOT DETECTED NOT DETECTED Final   Escherichia coli NOT DETECTED NOT DETECTED Final   Klebsiella aerogenes NOT DETECTED  NOT DETECTED Final   Klebsiella oxytoca NOT DETECTED NOT DETECTED Final   Klebsiella pneumoniae NOT DETECTED NOT DETECTED Final   Proteus species NOT DETECTED NOT DETECTED Final   Salmonella species NOT DETECTED NOT DETECTED Final   Serratia marcescens NOT DETECTED NOT DETECTED Final   Haemophilus influenzae NOT DETECTED NOT DETECTED Final   Neisseria meningitidis NOT DETECTED NOT DETECTED Final   Pseudomonas aeruginosa NOT DETECTED NOT DETECTED Final   Stenotrophomonas maltophilia NOT DETECTED NOT DETECTED Final   Candida albicans NOT DETECTED NOT DETECTED Final   Candida auris NOT DETECTED NOT DETECTED Final   Candida glabrata NOT DETECTED NOT DETECTED Final   Candida krusei NOT DETECTED NOT DETECTED Final   Candida parapsilosis NOT DETECTED NOT DETECTED Final   Candida tropicalis NOT DETECTED NOT DETECTED Final   Cryptococcus neoformans/gattii NOT DETECTED NOT DETECTED Final    Comment: Performed at [facility], [address], [place], [place] [address]  Blood Culture ID Panel (Reflexed)     Status: Abnormal   Collection Time: 10/17/23  1:18 AM  Result Value Ref Range Status   Enterococcus faecalis NOT DETECTED NOT DETECTED Final   Enterococcus Faecium NOT DETECTED NOT DETECTED Final   Listeria monocytogenes NOT DETECTED NOT DETECTED Final   Staphylococcus species NOT DETECTED NOT DETECTED Final   Staphylococcus aureus (BCID) NOT DETECTED NOT DETECTED Final   Staphylococcus epidermidis NOT DETECTED NOT DETECTED Final   Staphylococcus lugdunensis NOT DETECTED NOT DETECTED Final   Streptococcus species NOT DETECTED NOT DETECTED Final   Streptococcus agalactiae NOT DETECTED NOT DETECTED Final   Streptococcus pneumoniae NOT  DETECTED NOT DETECTED Final   Streptococcus pyogenes NOT DETECTED NOT DETECTED Final   A.calcoaceticus-baumannii NOT DETECTED NOT DETECTED Final   Bacteroides fragilis NOT DETECTED NOT DETECTED Final   Enterobacterales NOT DETECTED NOT DETECTED Final   Enterobacter cloacae complex NOT DETECTED NOT DETECTED Final   Escherichia coli NOT DETECTED NOT DETECTED Final   Klebsiella aerogenes NOT DETECTED NOT DETECTED Final   Klebsiella oxytoca NOT DETECTED NOT DETECTED Final   Klebsiella pneumoniae NOT DETECTED NOT DETECTED Final   Proteus species NOT DETECTED NOT DETECTED Final   Salmonella species NOT DETECTED NOT DETECTED Final   Serratia marcescens NOT DETECTED NOT DETECTED Final   Haemophilus influenzae NOT DETECTED NOT DETECTED Final   Neisseria meningitidis NOT DETECTED NOT DETECTED Final   Pseudomonas aeruginosa NOT DETECTED NOT DETECTED Final   Stenotrophomonas maltophilia NOT DETECTED NOT DETECTED Final   Candida albicans NOT DETECTED NOT DETECTED Final   Candida auris NOT DETECTED NOT DETECTED Final   Candida glabrata DETECTED (A) NOT DETECTED Final    Comment: CRITICAL RESULT CALLED TO, READ BACK BY AND VERIFIED WITH: PHARMD C DAVIS [id number] AT 1137 BY CM    Candida krusei NOT DETECTED NOT DETECTED Final   Candida parapsilosis NOT DETECTED NOT DETECTED Final   Candida tropicalis NOT DETECTED NOT DETECTED Final   Cryptococcus neoformans/gattii NOT DETECTED NOT DETECTED Final    Comment: Performed at [facility], [address], [place], [place] [address]  Culture, blood (Routine X 2) w Reflex to ID Panel     Status: None (Preliminary result)   Collection Time: 10/20/23  2:06 PM   Specimen: BLOOD  Result Value Ref Range Status   Specimen Description   Final    BLOOD SITE NOT SPECIFIED Performed at [facility], [address], [place], [place] [address]    Special Requests   Final  BOTTLES DRAWN AEROBIC ONLY Blood Culture results may not be optimal  due to an inadequate volume of blood received in culture bottles Performed at [facility], [address] [place], [place] [address]    Culture   Final    NO GROWTH 3 DAYS Performed at [facility], [address] [place], [place] [address]    Report Status PENDING  Incomplete  Culture, blood (Routine X 2) w Reflex to ID Panel     Status: None (Preliminary result)   Collection Time: 10/20/23  2:14 PM   Specimen: BLOOD  Result Value Ref Range Status   Specimen Description   Final    BLOOD SITE NOT SPECIFIED Performed at [facility], [address], [place], [place] [address]    Special Requests   Final    BOTTLES DRAWN AEROBIC ONLY Blood Culture results may not be optimal due to an inadequate volume of blood received in culture bottles Performed at [facility], [address], [place], [place] [address]    Culture   Final    NO GROWTH 3 DAYS Performed at [facility], [address] [place], [place] [address]    Report Status PENDING  Incomplete         Radiology Studies: MR ABDOMEN W WO CONTRAST Result Date: 10/21/2023 CLINICAL DATA:  Renal mass on CT EXAM: MRI ABDOMEN WITHOUT AND WITH CONTRAST TECHNIQUE: Multiplanar multisequence MR imaging of the abdomen was performed both before and after the administration of intravenous contrast. CONTRAST:  9mL GADAVIST GADOBUTROL 1 MMOL/ML IV SOLN COMPARISON:  CT abdomen/pelvis dated 10/20/2023 FINDINGS: Motion degraded images. Lower chest: Lung bases are clear. Hepatobiliary: Mild hepatic steatosis. Liver is otherwise within normal limits. Gallbladder is unremarkable. No intrahepatic or extrahepatic duct dilatation. Pancreas:  Within normal limits. Spleen:  Within normal limits. Adrenals/Urinary Tract:  Adrenal glands are within normal limits. Left kidney is within normal limits. Right renal cortical scarring/lobulation. Given motion degradation, the rounded area on CT  is poorly evaluated, but mild restricted diffusion is suspected (series 6/image 22) with subtle differential enhancement (series 11/image 50). As such, small solid renal neoplasm is difficult to exclude, but not well characterized on the current study. For reference, this lesion measures approximately 13 mm on MR (series 3/image 24). No hydronephrosis. Stomach/Bowel: Stomach is within normal limits. Visualized bowel is unremarkable. Vascular/Lymphatic:  No evidence of abdominal aortic aneurysm. No suspicious abdominal lymphadenopathy. Other:  No abdominal ascites. Musculoskeletal: No focal osseous lesions. IMPRESSION: Motion degraded images. 13 mm right renal lesion is poorly evaluated, but small solid renal neoplasm is difficult to exclude, although not well characterized on the current study. Consider follow-up CT or MRI abdomen with/without contrast in 4-6 weeks. Mild hepatic steatosis. Electronically Signed   By: Charline Bills M.D.   On: 10/21/2023 23:26     Scheduled Meds:  apixaban  5 mg Oral BID   atenolol  50 mg Oral BID   atorvastatin  80 mg Oral QHS   clonazePAM  0.5 mg Oral BID   ezetimibe  10 mg Oral QHS   FLUoxetine  20 mg Oral Daily   gabapentin  400 mg Oral TID   insulin aspart  0-15 Units Subcutaneous TID WC   insulin glargine-yfgn  10 Units Subcutaneous QHS   levothyroxine  88 mcg Oral Q0600   losartan  37.5 mg Oral Daily   magnesium oxide  400 mg Oral QODAY   melatonin  5  mg Oral QHS   metoCLOPramide  10 mg Oral TID AC & HS   mometasone-formoterol  2 puff Inhalation BID   nitrofurantoin (macrocrystal-monohydrate)  100 mg Oral QHS   pantoprazole  20 mg Oral Daily   phenylephrine  1 drop Both Eyes 3 times per day on Wednesday   QUEtiapine  25 mg Oral QHS   rOPINIRole  0.25 mg Oral QHS   senna-docusate  1 tablet Oral BID   tropicamide  1 drop Both Eyes 3 times per day on Wednesday   umeclidinium bromide  1 puff Inhalation Daily   Continuous Infusions:  micafungin  (MYCAMINE) 100 mg in sodium chloride 0.9 % 100 mL IVPB 100 mg (10/23/23 0950)    Osvaldo Shipper, MD [facility] 10/23/2023, 11:26 AM

## 2023-10-23 NOTE — Progress Notes (Signed)
Patient left the Unit to have TEE procedure done at [facility]. Transported via [facility]

## 2023-10-23 NOTE — Anesthesia Postprocedure Evaluation (Signed)
Anesthesia Post Note  Patient: Karla Hoover  Procedure(s) Performed: TRANSESOPHAGEAL ECHOCARDIOGRAM     Patient location during evaluation: PACU Anesthesia Type: MAC Level of consciousness: awake and alert Pain management: pain level controlled Vital Signs Assessment: post-procedure vital signs reviewed and stable Respiratory status: spontaneous breathing, nonlabored ventilation and respiratory function stable Cardiovascular status: blood pressure returned to baseline and stable Postop Assessment: no apparent nausea or vomiting Anesthetic complications: no   No notable events documented.  Last Vitals:  Vitals:   10/23/23 1440 10/23/23 1500  BP: 125/66 (!) 148/77  Pulse: 66 63  Resp: 14 12  Temp: 36.4 C 36.8 C  SpO2: 94% 95%    Last Pain:  Vitals:   10/23/23 1500  TempSrc: Oral  PainSc: 0-No pain                 Lannie Fields

## 2023-10-23 NOTE — Anesthesia Preprocedure Evaluation (Addendum)
Anesthesia Evaluation  Patient identified by MRN, date of birth, ID band Patient awake    Reviewed: Allergy & Precautions, NPO status , Patient's Chart, lab work & pertinent test results  Airway Mallampati: II  TM Distance: >3 FB Neck ROM: Full    Dental  (+) Poor Dentition, Missing, Dental Advisory Given   Pulmonary asthma , COPD,  COPD inhaler, Patient abstained from smoking., former smoker   breath sounds clear to auscultation       Cardiovascular hypertension, Pt. on home beta blockers and Pt. on medications  Rhythm:Regular Rate:Normal     Neuro/Psych  PSYCHIATRIC DISORDERS Anxiety Depression Bipolar Disorder   CVA    GI/Hepatic Neg liver ROS,GERD  Medicated,,  Endo/Other  diabetesHypothyroidism    Renal/GU Renal disease     Musculoskeletal negative musculoskeletal ROS (+)    Abdominal   Peds  Hematology negative hematology ROS (+)   Anesthesia Other Findings   Reproductive/Obstetrics                             Anesthesia Physical Anesthesia Plan  ASA: 3  Anesthesia Plan: MAC   Post-op Pain Management: Minimal or no pain anticipated   Induction: Intravenous  PONV Risk Score and Plan: 0  Airway Management Planned: Natural Airway and Nasal Cannula  Additional Equipment: None  Intra-op Plan:   Post-operative Plan:   Informed Consent: I have reviewed the patients History and Physical, chart, labs and discussed the procedure including the risks, benefits and alternatives for the proposed anesthesia with the patient or authorized representative who has indicated his/her understanding and acceptance.       Plan Discussed with: CRNA  Anesthesia Plan Comments:        Anesthesia Quick Evaluation

## 2023-10-23 NOTE — Plan of Care (Signed)

## 2023-10-23 NOTE — Interval H&P Note (Signed)
History and Physical Interval Note:  10/23/2023 1:15 PM  Karla Hoover  has presented today for surgery, with the diagnosis of fungemia.  The various methods of treatment have been discussed with the patient and family. After consideration of risks, benefits and other options for treatment, the patient has consented to  Procedure(s): TRANSESOPHAGEAL ECHOCARDIOGRAM (N/A) as a surgical intervention.  The patient's history has been reviewed, patient examined, no change in status, stable for surgery.  I have reviewed the patient's chart and labs.  Questions were answered to the patient's satisfaction.     Shylah Dossantos

## 2023-10-23 NOTE — CV Procedure (Signed)
    TRANSESOPHAGEAL ECHOCARDIOGRAM   NAME:  Karla Hoover    MRN: [id number] DOB:  1971/02/01    ADMIT DATE: 10/16/2023  INDICATIONS: Bacteremia   PROCEDURE:   Informed consent was obtained prior to the procedure. The risks, benefits and alternatives for the procedure were discussed and the patient comprehended these risks.  Risks include, but are not limited to, cough, sore throat, vomiting, nausea, somnolence, esophageal and stomach trauma or perforation, bleeding, low blood pressure, aspiration, pneumonia, infection, trauma to the teeth and death.    Procedural time out performed. The oropharynx was anesthetized with viscous lidocaine.  Anesthesia was administered by the anaesthesilogy team.  The patient was administered to achieve and maintain moderate to deep conscious sedation.  The patient's heart rate, blood pressure, and oxygen saturation were monitored continuously during the procedure.  The transesophageal probe was inserted in the esophagus and stomach without difficulty and multiple views were obtained.   The patient tolerated the procedure well.  COMPLICATIONS:    There were no immediate complications.  KEY FINDINGS:  Normal EF,trivial mitral regurgitation, vegetations not appreciated.  Full report to follow. Further management per primary team.   Thomasene Ripple, DO [facility]  [facility]  2:15 PM

## 2023-10-23 NOTE — Transfer of Care (Signed)
Immediate Anesthesia Transfer of Care Note  Patient: Azalee Course  Procedure(s) Performed: TRANSESOPHAGEAL ECHOCARDIOGRAM  Patient Location: PACU and Cath Lab  Anesthesia Type:MAC  Level of Consciousness: awake, alert , and oriented  Airway & Oxygen Therapy: Patient Spontanous Breathing and Patient connected to nasal cannula oxygen  Post-op Assessment: Report given to RN and Post -op Vital signs reviewed and stable  Post vital signs: Reviewed and stable  Last Vitals:  Vitals Value Taken Time  BP    Temp    Pulse    Resp    SpO2      Last Pain:  Vitals:   10/23/23 1301  TempSrc:   PainSc: 0-No pain      Patients Stated Pain Goal: 3 (10/22/23 1103)  Complications: No notable events documented.

## 2023-10-23 NOTE — Plan of Care (Signed)
  Problem: Activity: Goal: Risk for activity intolerance will decrease Outcome: Progressing   Problem: Coping: Goal: Level of anxiety will decrease Outcome: Progressing   Problem: Safety: Goal: Ability to remain free from injury will improve Outcome: Progressing   Problem: Skin Integrity: Goal: Risk for impaired skin integrity will decrease Outcome: Progressing   

## 2023-10-24 DIAGNOSIS — N390 Urinary tract infection, site not specified: Secondary | ICD-10-CM | POA: Diagnosis not present

## 2023-10-24 DIAGNOSIS — E039 Hypothyroidism, unspecified: Secondary | ICD-10-CM | POA: Diagnosis not present

## 2023-10-24 DIAGNOSIS — A419 Sepsis, unspecified organism: Secondary | ICD-10-CM | POA: Diagnosis not present

## 2023-10-24 LAB — COMPREHENSIVE METABOLIC PANEL
ALT: 13 U/L (ref 0–44)
AST: 20 U/L (ref 15–41)
Albumin: 3.3 g/dL — ABNORMAL LOW (ref 3.5–5.0)
Alkaline Phosphatase: 54 U/L (ref 38–126)
Anion gap: 11 (ref 5–15)
BUN: 19 mg/dL (ref 6–20)
CO2: 26 mmol/L (ref 22–32)
Calcium: 9.2 mg/dL (ref 8.9–10.3)
Chloride: 98 mmol/L (ref 98–111)
Creatinine, Ser: 0.94 mg/dL (ref 0.44–1.00)
GFR, Estimated: >60 mL/min (ref >60)
Glucose, Bld: 171 mg/dL — ABNORMAL HIGH (ref 70–99)
Potassium: 4 mmol/L (ref 3.5–5.1)
Sodium: 135 mmol/L (ref 135–145)
Total Bilirubin: 0.5 mg/dL (ref 0.0–1.2)
Total Protein: 6.6 g/dL (ref 6.5–8.1)

## 2023-10-24 LAB — CBC
HCT: 36.1 % (ref 36.0–46.0)
Hemoglobin: 11.4 g/dL — ABNORMAL LOW (ref 12.0–15.0)
MCH: 27.2 pg (ref 26.0–34.0)
MCHC: 31.6 g/dL (ref 30.0–36.0)
MCV: 86.2 fL (ref 80.0–100.0)
Platelets: 267 10*3/uL (ref 150–400)
RBC: 4.19 MIL/uL (ref 3.87–5.11)
RDW: 13.2 % (ref 11.5–15.5)
WBC: 8 10*3/uL (ref 4.0–10.5)
nRBC: 0 % (ref 0.0–0.2)

## 2023-10-24 LAB — GLUCOSE, CAPILLARY
Glucose-Capillary: 155 mg/dL — ABNORMAL HIGH (ref 70–99)
Glucose-Capillary: 234 mg/dL — ABNORMAL HIGH (ref 70–99)
Glucose-Capillary: 237 mg/dL — ABNORMAL HIGH (ref 70–99)
Glucose-Capillary: 188 mg/dL — ABNORMAL HIGH (ref 70–99)

## 2023-10-24 LAB — MAGNESIUM: Magnesium: 2.4 mg/dL (ref 1.7–2.4)

## 2023-10-24 MED ORDER — INSULIN GLARGINE 100 UNIT/ML ~~LOC~~ SOLN: 12.0000 [IU] | Freq: Every day | SUBCUTANEOUS | Status: DC

## 2023-10-24 MED ORDER — ROPINIROLE HCL 0.25 MG PO TABS: 0.2500 mg | ORAL_TABLET | Freq: Every day | ORAL | 0 refills | Status: AC

## 2023-10-24 MED ORDER — SODIUM CHLORIDE 0.9% FLUSH
10.0000 mL | Freq: Two times a day (BID) | INTRAVENOUS | Status: DC
  Administered 2023-10-24: 10 mL

## 2023-10-24 MED ORDER — GABAPENTIN 400 MG PO CAPS: 400.0000 mg | ORAL_CAPSULE | Freq: Three times a day (TID) | ORAL | 0 refills | Status: AC

## 2023-10-24 MED ORDER — MICAFUNGIN SODIUM 50 MG IV SOLR: 100.0000 mg | INTRAVENOUS | 0 refills | Status: AC

## 2023-10-24 MED ORDER — QUETIAPINE FUMARATE 25 MG PO TABS: 25.0000 mg | ORAL_TABLET | Freq: Every day | ORAL | 0 refills | Status: AC

## 2023-10-24 MED ORDER — MICAFUNGIN SODIUM 50 MG IV SOLR: 100.0000 mg | INTRAVENOUS | Status: DC

## 2023-10-24 MED ORDER — CLONAZEPAM 0.5 MG PO TABS: 0.5000 mg | ORAL_TABLET | Freq: Two times a day (BID) | ORAL | 0 refills | Status: DC

## 2023-10-24 MED ORDER — SODIUM CHLORIDE 0.9% FLUSH
10.0000 mL | INTRAVENOUS | Status: DC | PRN
Start: 2023-10-24 — End: 2023-10-25

## 2023-10-24 MED ORDER — SENNOSIDES-DOCUSATE SODIUM 8.6-50 MG PO TABS: 1.0000 | ORAL_TABLET | Freq: Two times a day (BID) | ORAL | Status: AC

## 2023-10-24 NOTE — Plan of Care (Signed)
  Problem: Fluid Volume: Goal: Ability to maintain a balanced intake and output will improve Outcome: Progressing   Problem: Metabolic: Goal: Ability to maintain appropriate glucose levels will improve Outcome: Progressing   Problem: Clinical Measurements: Goal: Diagnostic test results will improve Outcome: Progressing   Problem: Clinical Measurements: Goal: Signs and symptoms of infection will decrease Outcome: Progressing   Problem: Respiratory: Goal: Ability to maintain adequate ventilation will improve Outcome: Progressing

## 2023-10-24 NOTE — Plan of Care (Signed)
  Problem: Coping: Goal: Ability to adjust to condition or change in health will improve Outcome: Progressing   Problem: Fluid Volume: Goal: Ability to maintain a balanced intake and output will improve Outcome: Progressing   Problem: Clinical Measurements: Goal: Diagnostic test results will improve Outcome: Progressing Goal: Signs and symptoms of infection will decrease Outcome: Progressing   Problem: Respiratory: Goal: Ability to maintain adequate ventilation will improve Outcome: Progressing   Problem: Clinical Measurements: Goal: Diagnostic test results will improve Outcome: Progressing

## 2023-10-24 NOTE — Progress Notes (Signed)
Writer attempted to call nurse report to [facility] @ [phone]. Unsuccessful attempts at [phone], [phone], and [phone]. Phone rings multiple times with no response.

## 2023-10-24 NOTE — Progress Notes (Signed)
PHARMACY CONSULT NOTE FOR:  OUTPATIENT  PARENTERAL ANTIBIOTIC THERAPY (OPAT)  Informational only as the patient plans to discharge back to her facility  Indication: Candida glabrata fungemia Regimen: Micafungin 100 mg IV every 24 hours End date: 11/02/23 (2 weeks from neg BCx 1/13)  IV antibiotic discharge orders are pended. To discharging provider:  please sign these orders via discharge navigator,  Select New Orders & click on the button choice - Manage This Unsigned Work.     Thank you for allowing pharmacy to be a part of this patient's care.  Georgina Pillion, PharmD, BCPS, BCIDP Infectious Diseases Clinical Pharmacist 10/24/2023 11:11 AM   **Pharmacist phone directory can now be found on [email or web address] ([email or web address]).  Listed under [facility].

## 2023-10-24 NOTE — TOC Transition Note (Signed)
Transition of Care [facility]) - Discharge Note   Patient Details  Name: Karla Hoover MRN: [id number] Date of Birth: 1971-02-02  Transition of Care (TOC) CM/SW Contact:  Armanda Heritage, RN Phone Number: 10/24/2023, 3:26 PM   Clinical Narrative:     Noted patient is ready for discharge today.  Patient is LTC resident at [facility], spoke with facility rep who reports patient can return to room 305, report number [phone].  DC summary sent via HUB.  [facility] transport arranged. No further TOC needs identified.   Final next level of care: Skilled Nursing Facility Barriers to Discharge: No Barriers Identified   Patient Goals and CMS Choice Patient states their goals for this hospitalization and ongoing recovery are:: to return to [place] at discharge CMS Medicare.gov Compare Post Acute Care list provided to:: Patient Choice offered to / list presented to : Patient      Discharge Placement                       Discharge Plan and Services Additional resources added to the After Visit Summary for       Post Acute Care Choice: Resumption of Svcs/PTA Provider                               Social Drivers of Health (SDOH) Interventions SDOH Screenings   Food Insecurity: No Food Insecurity (10/17/2023)  Housing: Low Risk  (10/17/2023)  Transportation Needs: No Transportation Needs (10/17/2023)  Utilities: Not At Risk (10/17/2023)  Depression (PHQ2-9): Medium Risk (10/04/2019)  Financial Resource Strain: Not on File (08/17/2018)   Received from [place], [place]  Physical Activity: Not on File (08/17/2018)   Received from [place], [place]  Social Connections: Not on File (06/28/2023)   Received from [facility]  Stress: Not on File (08/17/2018)   Received from [place], [place]  Tobacco Use: Medium Risk (10/23/2023)     Readmission Risk Interventions    09/10/2023   10:01 AM 01/21/2023   12:08 PM 12/18/2022   11:10 AM  Readmission Risk Prevention Plan  Transportation  Screening Complete Complete Complete  PCP or Specialist Appt within 5-7 Days  Complete   PCP or Specialist Appt within 3-5 Days Complete  Complete  Home Care Screening  Complete   Medication Review (RN CM)  Complete   HRI or Home Care Consult Complete  Complete  Social Work Consult for Recovery Care Planning/Counseling Complete  Complete  Palliative Care Screening Not Applicable  Not Applicable  Medication Review Oceanographer) Complete  Complete

## 2023-10-24 NOTE — Progress Notes (Signed)
PROGRESS NOTE    Karla Hoover  ZOX:[id number] DOB: [birth year] DOA: 10/16/2023 PCP: Pcp, No   Brief Narrative:  53 y.o. female with medical history significant of asthma, seasonal allergies, stage 3a CKD, bipolar disorder, class I obesity, tobacco abuse, COPD with chronic bronchitis and chronic respiratory failure with hypoxia, type II DM, hypertension, GERD, grade 1 diastolic dysfunction, hypothyroidism, hyperlipidemia, bilateral pulmonary embolism, DVT, history of other non-hemorrhagic stroke with residual left-sided hemiparesis and recent admission last month for ESBL E. coli and Klebsiella UTI treated with antibiotics presented with nausea, vomiting, fever, dysuria and suprapubic/flank pain.  On presentation, UA was suggestive of UTI; WBCs of 17, creatinine of 1.44 and lactic acid of 2.2.  Chest x-ray showed no active disease.  CT renal stone study showed no acute findings in the abdomen or pelvis with no nephrolithiasis or obstructive uropathy.  She was started on IV fluids and broad-spectrum antibiotics.  Blood cultures turned positive for Candida glabrata on 10/20/2023: ID was auto consulted; meropenem discontinued and Mycamine was started.  Assessment & Plan:  Fungemia/Sepsis: Present on admission/Lactic acidosis -Presented with fever, lactic acidosis, leukocytosis with possible UTI -Had ESBL E. coli during last admission.  Initially started on meropenem.  Urine cultures had insignificant growth.   Blood culture growing Staph hominis in 1 bottle: Possibly contaminant Blood cultures turned positive for Candida glabrata on 10/20/2023. ID was consulted.  Repeat blood cultures were ordered on 1/13 and negative so far. Patient was started on Mycamine. Echocardiogram did not show any concerning findings.  Subsequently she underwent TEE which also did not reveal any endocarditis. Seen by ophthalmology and there is no evidence for ocular infection at this time. ID to determine further course of  action.  Right renal mass Noted on CT scan.  Underwent MRI in which the renal lesion was poorly evaluated.  Recommendation is to repeat imaging study in a few weeks.  Can be pursued in the outpatient setting.  Diabetes mellitus type 2 with hyperglycemia Peripheral neuropathy -Carb modified diet.  Continue long-acting insulin along with CBGs with SSI.  Continue gabapentin.  CBGs are reasonably well-controlled.  HbA1c 10.3 in November 2024.  Chronic respiratory failure with hypoxia/COPD On home oxygen.  Stable. Outpatient follow-up with pulmonary  Essential hypertension Continue losartan and atenolol.  Blood pressure is reasonably well-controlled with occasional high readings.  History of unspecified CVA with left-sided hemiparesis/Hyperlipidemia -Continue supportive care.  Outpatient follow-up with neurology.  Continue ezetimibe/statin.  PT recommended no PT follow-up  History of PE/DVT -Continue Eliquis  Grade 1 diastolic dysfunction -Continue beta-blocker and losartan   CKD stage IIIa -Creatinine currently stable.  Monitor  Anxiety/depression -Continue fluoxetine  Prolonged QT -Monitor on telemetry.  Avoid QT prolonging meds as much as possible  Obesity Estimated body mass index is 31.97 kg/m as calculated from the following:   Height as of this encounter: 5' 7.5" (1.715 m).   Weight as of this encounter: 94 kg.  Leukocytosis -Resolved  DVT prophylaxis:  Eliquis Code Status: Full Family Communication: None at bedside Disposition Plan: From SNF.  Discharge when cleared by infectious diseases.  Consultants: ID  Procedures: None  Antimicrobials:  Anti-infectives (From admission, onward)    Start     Dose/Rate Route Frequency Ordered Stop   10/20/23 2200  nitrofurantoin (macrocrystal-monohydrate) (MACROBID) capsule 100 mg        100 mg Oral Daily at bedtime 10/20/23 1340     10/20/23 1115  micafungin (MYCAMINE) 100 mg in sodium chloride 0.9 % 100  mL IVPB         100 mg 105 mL/hr over 1 Hours Intravenous Daily 10/20/23 1020     10/18/23 0600  cefTRIAXone (ROCEPHIN) 2 g in sodium chloride 0.9 % 100 mL IVPB  Status:  Discontinued        2 g 200 mL/hr over 30 Minutes Intravenous Every 24 hours 10/17/23 0747 10/17/23 0902   10/17/23 0900  meropenem (MERREM) 1 g in sodium chloride 0.9 % 100 mL IVPB  Status:  Discontinued        1 g 200 mL/hr over 30 Minutes Intravenous Every 8 hours 10/17/23 0858 10/20/23 1043   10/17/23 0145  cefTRIAXone (ROCEPHIN) 2 g in sodium chloride 0.9 % 100 mL IVPB        2 g 200 mL/hr over 30 Minutes Intravenous Once 10/17/23 0132 10/17/23 0305      Subjective: Feels well for the most part.  Has had chronic aches and pains.  Objective: Vitals:   10/23/23 1951 10/23/23 2118 10/24/23 0441 10/24/23 0753  BP:  (!) 147/71 133/72   Pulse:  70 70   Resp:  16 18   Temp:  97.8 F (36.6 C) 97.8 F (36.6 C)   TempSrc:  Oral Oral   SpO2: 90% 95% 91% 95%  Weight:      Height:        Intake/Output Summary (Last 24 hours) at 10/24/2023 1055 Last data filed at 10/23/2023 1800 Gross per 24 hour  Intake 600 ml  Output 200 ml  Net 400 ml    Filed Weights   10/17/23 1628  Weight: 94 kg     Examination:  General appearance: Awake alert.  In no distress Resp: Clear to auscultation bilaterally.  Normal effort Cardio: S1-S2 is normal regular.  No S3-S4.  No rubs murmurs or bruit GI: Abdomen is soft.  Nontender nondistended.  Bowel sounds are present normal.  No masses organomegaly    Data Reviewed: I have personally reviewed following labs and imaging studies  CBC: Recent Labs  Lab 10/17/23 1948 10/18/23 0521 10/19/23 0448 10/22/23 0411 10/24/23 0410  WBC  --  10.1 7.2 7.3 8.0  NEUTROABS  --  6.2 3.8 3.6  --   HGB 11.4* 10.7* 10.2* 11.7* 11.4*  HCT 34.8* 34.6* 32.3* 36.9 36.1  MCV  --  88.7 87.5 85.4 86.2  PLT  --  166 180 249 267   Basic Metabolic Panel: Recent Labs  Lab 10/18/23 0521 10/19/23 0448  10/20/23 0422 10/22/23 0411 10/24/23 0410  NA 136 137 137 137 135  K 4.3 4.2 4.3 4.0 4.0  CL 100 100 96* 98 98  CO2 28 28 29 28 26   GLUCOSE 163* 181* 152* 206* 171*  BUN 21* 15 12 18 19   CREATININE 1.41* 1.10* 1.02* 0.93 0.94  CALCIUM 8.7* 8.8* 9.4 9.2 9.2  MG  --  1.9 1.8 2.4 2.4   GFR: Estimated Creatinine Clearance: 84.2 mL/min (by C-G formula based on SCr of 0.94 mg/dL).  Liver Function Tests: Recent Labs  Lab 10/18/23 0521 10/24/23 0410  AST 14* 20  ALT 11 13  ALKPHOS 47 54  BILITOT 0.8 0.5  PROT 5.9* 6.6  ALBUMIN 2.6* 3.3*    CBG: Recent Labs  Lab 10/23/23 0741 10/23/23 1211 10/23/23 1659 10/23/23 2112 10/24/23 0738  GLUCAP 176* 151* 128* 243* 155*    Sepsis Labs: Recent Labs  Lab 10/17/23 2230  LATICACIDVEN 0.9    Recent Results (from the past 240  hours)  Resp panel by RT-PCR (RSV, Flu A&B, Covid) Anterior Nasal Swab     Status: None   Collection Time: 10/16/23 11:17 PM   Specimen: Anterior Nasal Swab  Result Value Ref Range Status   SARS Coronavirus 2 by RT PCR NEGATIVE NEGATIVE Final    Comment: (NOTE) SARS-CoV-2 target nucleic acids are NOT DETECTED.  The SARS-CoV-2 RNA is generally detectable in upper respiratory specimens during the acute phase of infection. The lowest concentration of SARS-CoV-2 viral copies this assay can detect is 138 copies/mL. A negative result does not preclude SARS-Cov-2 infection and should not be used as the sole basis for treatment or other patient management decisions. A negative result may occur with  improper specimen collection/handling, submission of specimen other than nasopharyngeal swab, presence of viral mutation(s) within the areas targeted by this assay, and inadequate number of viral copies(<138 copies/mL). A negative result must be combined with clinical observations, patient history, and epidemiological information. The expected result is Negative.  Fact Sheet for Patients:   BloggerCourse.com  Fact Sheet for Healthcare Providers:  SeriousBroker.it  This test is no t yet approved or cleared by the [place] FDA and  has been authorized for detection and/or diagnosis of SARS-CoV-2 by FDA under an Emergency Use Authorization (EUA). This EUA will remain  in effect (meaning this test can be used) for the duration of the COVID-19 declaration under Section 564(b)(1) of the Act, 21 U.S.C.section 360bbb-3(b)(1), unless the authorization is terminated  or revoked sooner.       Influenza A by PCR NEGATIVE NEGATIVE Final   Influenza B by PCR NEGATIVE NEGATIVE Final    Comment: (NOTE) The Xpert Xpress SARS-CoV-2/FLU/RSV plus assay is intended as an aid in the diagnosis of influenza from Nasopharyngeal swab specimens and should not be used as a sole basis for treatment. Nasal washings and aspirates are unacceptable for Xpert Xpress SARS-CoV-2/FLU/RSV testing.  Fact Sheet for Patients: BloggerCourse.com  Fact Sheet for Healthcare Providers: SeriousBroker.it  This test is not yet approved or cleared by the [place] FDA and has been authorized for detection and/or diagnosis of SARS-CoV-2 by FDA under an Emergency Use Authorization (EUA). This EUA will remain in effect (meaning this test can be used) for the duration of the COVID-19 declaration under Section 564(b)(1) of the Act, 21 U.S.C. section 360bbb-3(b)(1), unless the authorization is terminated or revoked.     Resp Syncytial Virus by PCR NEGATIVE NEGATIVE Final    Comment: (NOTE) Fact Sheet for Patients: BloggerCourse.com  Fact Sheet for Healthcare Providers: SeriousBroker.it  This test is not yet approved or cleared by the [place] FDA and has been authorized for detection and/or diagnosis of SARS-CoV-2 by FDA under an Emergency Use  Authorization (EUA). This EUA will remain in effect (meaning this test can be used) for the duration of the COVID-19 declaration under Section 564(b)(1) of the Act, 21 U.S.C. section 360bbb-3(b)(1), unless the authorization is terminated or revoked.  Performed at [facility], [address], [place], [place] [address]   Urine Culture     Status: Abnormal   Collection Time: 10/17/23  1:07 AM   Specimen: Urine, Random  Result Value Ref Range Status   Specimen Description   Final    URINE, RANDOM Performed at [facility] [address] [place], [place] [address]    Special Requests   Final    NONE Reflexed from [id number] Performed at [facility], [address] [place],  [place] [address]    Culture (A)  Final    <10,000 COLONIES/mL INSIGNIFICANT GROWTH Performed at [facility], [address] [place], [place] [address]    Report Status 10/18/2023 FINAL  Final  Blood Culture (routine x 2)     Status: Abnormal (Preliminary result)   Collection Time: 10/17/23  1:18 AM   Specimen: BLOOD LEFT FOREARM  Result Value Ref Range Status   Specimen Description   Final    BLOOD LEFT FOREARM Performed at [facility] [address], [place], [place] [address]    Special Requests   Final    BOTTLES DRAWN AEROBIC AND ANAEROBIC Blood Culture adequate volume Performed at [facility], [address] [place], [place] [address]    Culture  Setup Time   Final    GRAM POSITIVE COCCI ANAEROBIC BOTTLE ONLY CRITICAL RESULT CALLED TO, READ BACK BY AND VERIFIED WITH: PHARMD E JACKSON 10/18/2023 @ 0003 BY AB YEAST AEROBIC BOTTLE ONLY CRITICAL RESULT CALLED TO, READ BACK BY AND VERIFIED WITH: PHARMD C DAVIS [id number] AT 1137 BY CM    Culture (A)  Final    STAPHYLOCOCCUS HOMINIS THE SIGNIFICANCE OF ISOLATING THIS ORGANISM FROM A SINGLE SET OF BLOOD CULTURES WHEN MULTIPLE SETS ARE DRAWN IS  UNCERTAIN. PLEASE NOTIFY THE MICROBIOLOGY DEPARTMENT WITHIN ONE WEEK IF SPECIATION AND SENSITIVITIES ARE REQUIRED. CANDIDA GLABRATA Sent to [facility] for further susceptibility testing. Performed at [facility], [address] [place], [place] [address]    Report Status PENDING  Incomplete  Blood Culture (routine x 2)     Status: None   Collection Time: 10/17/23  1:18 AM   Specimen: BLOOD RIGHT FOREARM  Result Value Ref Range Status   Specimen Description   Final    BLOOD RIGHT FOREARM Performed at [facility], [address] [place], [place] [address]    Special Requests   Final    BOTTLES DRAWN AEROBIC AND ANAEROBIC Blood Culture adequate volume Performed at [facility], [address] [place], [place] [address]    Culture   Final    NO GROWTH 5 DAYS Performed at [facility], [address] [place], [place] [address]    Report Status 10/22/2023 FINAL  Final  Blood Culture ID Panel (Reflexed)     Status: Abnormal   Collection Time: 10/17/23  1:18 AM  Result Value Ref Range Status   Enterococcus faecalis NOT DETECTED NOT DETECTED Final   Enterococcus Faecium NOT DETECTED NOT DETECTED Final   Listeria monocytogenes NOT DETECTED NOT DETECTED Final   Staphylococcus species DETECTED (A) NOT DETECTED Final    Comment: CRITICAL RESULT CALLED TO, READ BACK BY AND VERIFIED WITH: PHARMD E JACKSON 10/18/2023 @ 0003 BY AB    Staphylococcus aureus (BCID) NOT DETECTED NOT DETECTED Final   Staphylococcus epidermidis NOT DETECTED NOT DETECTED Final   Staphylococcus lugdunensis NOT DETECTED NOT DETECTED Final   Streptococcus species NOT DETECTED NOT DETECTED Final   Streptococcus agalactiae NOT DETECTED NOT DETECTED Final   Streptococcus pneumoniae NOT DETECTED NOT DETECTED Final   Streptococcus pyogenes NOT DETECTED NOT DETECTED Final   A.calcoaceticus-baumannii NOT DETECTED NOT DETECTED Final   Bacteroides fragilis NOT DETECTED NOT  DETECTED Final   Enterobacterales NOT DETECTED NOT DETECTED Final   Enterobacter cloacae complex NOT DETECTED NOT DETECTED Final   Escherichia coli NOT DETECTED NOT DETECTED Final   Klebsiella aerogenes NOT DETECTED NOT DETECTED Final   Klebsiella  oxytoca NOT DETECTED NOT DETECTED Final   Klebsiella pneumoniae NOT DETECTED NOT DETECTED Final   Proteus species NOT DETECTED NOT DETECTED Final   Salmonella species NOT DETECTED NOT DETECTED Final   Serratia marcescens NOT DETECTED NOT DETECTED Final   Haemophilus influenzae NOT DETECTED NOT DETECTED Final   Neisseria meningitidis NOT DETECTED NOT DETECTED Final   Pseudomonas aeruginosa NOT DETECTED NOT DETECTED Final   Stenotrophomonas maltophilia NOT DETECTED NOT DETECTED Final   Candida albicans NOT DETECTED NOT DETECTED Final   Candida auris NOT DETECTED NOT DETECTED Final   Candida glabrata NOT DETECTED NOT DETECTED Final   Candida krusei NOT DETECTED NOT DETECTED Final   Candida parapsilosis NOT DETECTED NOT DETECTED Final   Candida tropicalis NOT DETECTED NOT DETECTED Final   Cryptococcus neoformans/gattii NOT DETECTED NOT DETECTED Final    Comment: Performed at [facility], [address], [place], [place] [address]  Blood Culture ID Panel (Reflexed)     Status: Abnormal   Collection Time: 10/17/23  1:18 AM  Result Value Ref Range Status   Enterococcus faecalis NOT DETECTED NOT DETECTED Final   Enterococcus Faecium NOT DETECTED NOT DETECTED Final   Listeria monocytogenes NOT DETECTED NOT DETECTED Final   Staphylococcus species NOT DETECTED NOT DETECTED Final   Staphylococcus aureus (BCID) NOT DETECTED NOT DETECTED Final   Staphylococcus epidermidis NOT DETECTED NOT DETECTED Final   Staphylococcus lugdunensis NOT DETECTED NOT DETECTED Final   Streptococcus species NOT DETECTED NOT DETECTED Final   Streptococcus agalactiae NOT DETECTED NOT DETECTED Final   Streptococcus pneumoniae NOT DETECTED NOT DETECTED Final    Streptococcus pyogenes NOT DETECTED NOT DETECTED Final   A.calcoaceticus-baumannii NOT DETECTED NOT DETECTED Final   Bacteroides fragilis NOT DETECTED NOT DETECTED Final   Enterobacterales NOT DETECTED NOT DETECTED Final   Enterobacter cloacae complex NOT DETECTED NOT DETECTED Final   Escherichia coli NOT DETECTED NOT DETECTED Final   Klebsiella aerogenes NOT DETECTED NOT DETECTED Final   Klebsiella oxytoca NOT DETECTED NOT DETECTED Final   Klebsiella pneumoniae NOT DETECTED NOT DETECTED Final   Proteus species NOT DETECTED NOT DETECTED Final   Salmonella species NOT DETECTED NOT DETECTED Final   Serratia marcescens NOT DETECTED NOT DETECTED Final   Haemophilus influenzae NOT DETECTED NOT DETECTED Final   Neisseria meningitidis NOT DETECTED NOT DETECTED Final   Pseudomonas aeruginosa NOT DETECTED NOT DETECTED Final   Stenotrophomonas maltophilia NOT DETECTED NOT DETECTED Final   Candida albicans NOT DETECTED NOT DETECTED Final   Candida auris NOT DETECTED NOT DETECTED Final   Candida glabrata DETECTED (A) NOT DETECTED Final    Comment: CRITICAL RESULT CALLED TO, READ BACK BY AND VERIFIED WITH: PHARMD C DAVIS [id number] AT 1137 BY CM    Candida krusei NOT DETECTED NOT DETECTED Final   Candida parapsilosis NOT DETECTED NOT DETECTED Final   Candida tropicalis NOT DETECTED NOT DETECTED Final   Cryptococcus neoformans/gattii NOT DETECTED NOT DETECTED Final    Comment: Performed at [facility], [address] [place], [place] [address]  Culture, blood (Routine X 2) w Reflex to ID Panel     Status: None (Preliminary result)   Collection Time: 10/20/23  2:06 PM   Specimen: BLOOD  Result Value Ref Range Status   Specimen Description   Final    BLOOD SITE NOT SPECIFIED Performed at [facility], [address] [place], [place] [address]    Special Requests   Final    BOTTLES DRAWN AEROBIC  ONLY Blood Culture results may not be optimal due to an inadequate volume of  blood received in culture bottles Performed at [facility], [address], [place], [place] [address]    Culture   Final    NO GROWTH 4 DAYS Performed at [facility], [address] [place] [address], [place], [place] [address]    Report Status PENDING  Incomplete  Culture, blood (Routine X 2) w Reflex to ID Panel     Status: None (Preliminary result)   Collection Time: 10/20/23  2:14 PM   Specimen: BLOOD  Result Value Ref Range Status   Specimen Description   Final    BLOOD SITE NOT SPECIFIED Performed at [facility], [address] [place], [place] [address]    Special Requests   Final    BOTTLES DRAWN AEROBIC ONLY Blood Culture results may not be optimal due to an inadequate volume of blood received in culture bottles Performed at [facility], [address] [place], [place] [address]    Culture   Final    NO GROWTH 4 DAYS Performed at [facility], [address], [place], [place] [address]    Report Status PENDING  Incomplete         Radiology Studies: ECHO TEE Result Date: 10/23/2023    TRANSESOPHOGEAL ECHO REPORT   Patient Name:   Karla Hoover Date of Exam: 10/23/2023 Medical Rec #:  [id number]         Height:       67.5 in Accession #:    [id number]        Weight:       207.2 lb Date of Birth:  [birth year]         BSA:          2.064 m Patient Age:    52 years          BP:           108/71 mmHg Patient Gender: F                 HR:           60 bpm. Exam Location:  Inpatient Procedure: Transesophageal Echo, Color Doppler and Cardiac Doppler Indications:    Bacteremia  History:        Patient has prior history of Echocardiogram examinations, most                 recent 10/20/2023.  Sonographer:    Harriette Bouillon RDCS Referring Phys: [id number] [facility] PROCEDURE: The transesophogeal probe was passed without difficulty through the esophogus of the patient. Sedation performed by different physician. The patient's vital  signs; including heart rate, blood pressure, and oxygen saturation; remained stable throughout the procedure. The patient developed no complications during the procedure.  IMPRESSIONS  1. Left ventricular ejection fraction, by estimation, is 60 to 65%. The left ventricle has normal function. The left ventricle has no regional wall motion abnormalities.  2. Right ventricular systolic function is normal. The right ventricular size is normal.  3. No left atrial/left atrial appendage thrombus was detected. The LAA emptying velocity was 34 cm/s.  4. The mitral valve is abnormal. Mild mitral valve regurgitation. No evidence of mitral stenosis. 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  5. The aortic valve is tricuspid. Aortic valve regurgitation is not visualized. No aortic stenosis is present.  6. The inferior vena cava is normal in  size with greater than 50% respiratory variability, suggesting right atrial pressure of 3 mmHg. Conclusion(s)/Recommendation(s): Normal biventricular function without evidence of hemodynamically significant valvular heart disease. FINDINGS  Left Ventricle: Left ventricular ejection fraction, by estimation, is 60 to 65%. The left ventricle has normal function. The left ventricle has no regional wall motion abnormalities. The left ventricular internal cavity size was normal in size. There is  no left ventricular hypertrophy. Right Ventricle: The right ventricular size is normal. No increase in right ventricular wall thickness. Right ventricular systolic function is normal. Left Atrium: Left atrial size was normal in size. No left atrial/left atrial appendage thrombus was detected. The LAA emptying velocity was 34 cm/s. Right Atrium: Right atrial size was normal in size. Pericardium: There is no evidence of pericardial effusion. Mitral Valve: The mitral valve is abnormal. 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 Mild mitral valve regurgitation. No evidence  of mitral valve stenosis. Tricuspid Valve: The tricuspid valve is normal in structure. Tricuspid valve regurgitation is mild . No evidence of tricuspid stenosis. Aortic Valve: The aortic valve is tricuspid. Aortic valve regurgitation is not visualized. No aortic stenosis is present. Pulmonic Valve: The pulmonic valve was normal in structure. Pulmonic valve regurgitation is not visualized. No evidence of pulmonic stenosis. Aorta: The aortic root is normal in size and structure. Pulmonary Artery: The pulmonary artery is of normal size. Venous: The right upper pulmonary vein, right lower pulmonary vein, left lower pulmonary vein and left upper pulmonary vein are normal. A normal flow pattern is recorded from the left lower pulmonary vein. The inferior vena cava is normal in size with greater than 50% respiratory variability, suggesting right atrial pressure of 3 mmHg. IAS/Shunts: No atrial level shunt detected by color flow Doppler.   AORTA Ao Asc diam: 3.30 cm Lavona Mound Tobb DO Electronically signed by Thomasene Ripple DO Signature Date/Time: 10/23/2023/3:26:57 PM    Final    EP STUDY Result Date: 10/23/2023 See surgical note for result.    Scheduled Meds:  apixaban  5 mg Oral BID   atenolol  50 mg Oral BID   atorvastatin  80 mg Oral QHS   clonazePAM  0.5 mg Oral BID   ezetimibe  10 mg Oral QHS   FLUoxetine  20 mg Oral Daily   gabapentin  400 mg Oral TID   insulin aspart  0-15 Units Subcutaneous TID WC   insulin glargine-yfgn  10 Units Subcutaneous QHS   levothyroxine  88 mcg Oral Q0600   losartan  37.5 mg Oral Daily   magnesium oxide  400 mg Oral QODAY   melatonin  5 mg Oral QHS   mometasone-formoterol  2 puff Inhalation BID   nitrofurantoin (macrocrystal-monohydrate)  100 mg Oral QHS   pantoprazole  20 mg Oral Daily   phenylephrine  1 drop Both Eyes 3 times per day on Wednesday   QUEtiapine  25 mg Oral QHS   rOPINIRole  0.25 mg Oral QHS   senna-docusate  1 tablet Oral BID   tropicamide  1 drop  Both Eyes 3 times per day on Wednesday   umeclidinium bromide  1 puff Inhalation Daily   Continuous Infusions:  micafungin (MYCAMINE) 100 mg in sodium chloride 0.9 % 100 mL IVPB 100 mg (10/24/23 1024)    Osvaldo Shipper, MD [facility] 10/24/2023, 10:55 AM

## 2023-10-24 NOTE — Progress Notes (Signed)
Pt discharged at this time in no acute episodes. Alert and oriented. VS stable. Night med administered, tolerated well.

## 2023-10-24 NOTE — Progress Notes (Addendum)
[facility]  Date of Admission:  10/16/2023   Total days of inpatient antibiotics 6  Principal Problem:   Sepsis secondary to UTI [facility]) Active Problems:   Essential hypertension   COPD with chronic bronchitis (HCC)   hx of PE/DVT   Chronic respiratory failure with hypoxia (HCC)   Type 2 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 disease, with long-term current use of insulin (HCC)   GERD without esophagitis   Hypothyroidism   Grade I diastolic dysfunction   Hemiparesis affecting left side as late effect of cerebrovascular accident (CVA) (HCC)   Class 1 obesity due to excess calories with body mass index (BMI) of 32.0 to 32.9 in adult   Dyslipidemia   Anxiety   Depression   Prolonged QT interval          Assessment: 53 year old female with history of ESBL E. coli UTI on suppressive Macrobid, diabetes mellitus, CKD, tobacco abuse admitted with sepsis secondary to presumed UTI, ID engaged for:  #Candida glabrata fungemia 2/2 unclear source - 1/10 blood culture growing yeast 1/2 set, Staph hominis out of 1 out of 2 sets contaminant.  BC ID positive for Candida glabrata - Urine cultures less than 10,000 colony - CT renal study showed no nephrolithiasis, obstruction uropathy no acute findings in noncontrast CT abdomen pelvis -CT w/ con showed right renal mass, recc mri renal protocol -TTE no veg, will get TEE no veg -Optho eval beign Recommendations:  -Continue micafungin, plan on 2 weeks abx from negative Cx. EOT 1/26. Source is unclear. She does have possible renal mass, but no concern for infection on imaging.  -Midline orders placed -Follow repeat  blood Cx to ensure clearance -MRI renal protocol motion degraded recc CT or MRI in 4-6 weeks. Can be done outpatient with priamary.     #History of recurrent UTI - Continue suppressive Macrobid #Diabetes mellitus #Tobacco abuse   ID will sign off  OPAT ORDERS:  Diagnosis: candida  glabrata fungemia  Allergies  Allergen Reactions   Cipro [Ciprofloxacin Hcl] Hives   Guaifenesin Anaphylaxis   Kiwi Extract Anaphylaxis and Rash   Levaquin [Levofloxacin] Shortness Of Breath and Rash   Strawberry Extract Anaphylaxis and Rash   Lioresal [Baclofen] Other (See Comments)    Near syncope/ fall     Discharge antibiotics to be given via PICC line:  Per pharmacy protocol micafungin 175m g IV qd   Duration: 2 weeks End Date: 1/26  [facility] Care Per Protocol with [employer] Use: Home health RN for IV administration and teaching, line care and labs.    Labs weekly while on IV antibiotics: _x_ CBC with differential __ BMP **TWICE WEEKLY ON VANCOMYCIN  __x CMP __x CRP _x_ ESR __ Vancomycin trough TWICE WEEKLY __ CK  _x_ Please pull PIC at completion of IV antibiotics __ Please leave PIC in place until doctor has seen patient or been notified  Fax weekly labs to [phone]  Clinic Follow Up Appt: 1/27  @ [facility] with Dr. Thedore Mins  Microbiology:   Antibiotics: Ceftriaxone 1/9 Merrem 1/10-1/12 Nuca 1/13- Macrobid 1/13-   Cultures: Blood 1/10 1/2 candida g and Staph hominis 1/13 Urine 1/10 less than 10,000 colonies Other  SUBJECTIVE: Eating breakfast this AM Interval: afebrile overnight  Review of Systems: Review of Systems  All other systems reviewed and are negative.    Scheduled Meds:  apixaban  5 mg Oral BID   atenolol  50 mg Oral  BID   atorvastatin  80 mg Oral QHS   clonazePAM  0.5 mg Oral BID   ezetimibe  10 mg Oral QHS   FLUoxetine  20 mg Oral Daily   gabapentin  400 mg Oral TID   insulin aspart  0-15 Units Subcutaneous TID WC   insulin glargine-yfgn  10 Units Subcutaneous QHS   levothyroxine  88 mcg Oral Q0600   losartan  37.5 mg Oral Daily   magnesium oxide  400 mg Oral QODAY   melatonin  5 mg Oral QHS   mometasone-formoterol  2 puff Inhalation BID   nitrofurantoin (macrocrystal-monohydrate)  100 mg Oral QHS   pantoprazole  20 mg  Oral Daily   phenylephrine  1 drop Both Eyes 3 times per day on Wednesday   QUEtiapine  25 mg Oral QHS   rOPINIRole  0.25 mg Oral QHS   senna-docusate  1 tablet Oral BID   tropicamide  1 drop Both Eyes 3 times per day on Wednesday   umeclidinium bromide  1 puff Inhalation Daily   Continuous Infusions:  micafungin (MYCAMINE) 100 mg in sodium chloride 0.9 % 100 mL IVPB 100 mg (10/24/23 1024)   PRN Meds:.acetaminophen **OR** acetaminophen, bisacodyl, cyclobenzaprine, polyethylene glycol, prochlorperazine, traMADol Allergies  Allergen Reactions   Cipro [Ciprofloxacin Hcl] Hives   Guaifenesin Anaphylaxis   Kiwi Extract Anaphylaxis and Rash   Levaquin [Levofloxacin] Shortness Of Breath and Rash   Strawberry Extract Anaphylaxis and Rash   Lioresal [Baclofen] Other (See Comments)    Near syncope/ fall    OBJECTIVE: Vitals:   10/23/23 1951 10/23/23 2118 10/24/23 0441 10/24/23 0753  BP:  (!) 147/71 133/72   Pulse:  70 70   Resp:  16 18   Temp:  97.8 F (36.6 C) 97.8 F (36.6 C)   TempSrc:  Oral Oral   SpO2: 90% 95% 91% 95%  Weight:      Height:       Body mass index is 31.97 kg/m.  Physical Exam Constitutional:      Appearance: Normal appearance.  HENT:     Head: Normocephalic and atraumatic.     Right Ear: Tympanic membrane normal.     Left Ear: Tympanic membrane normal.     Nose: Nose normal.     Mouth/Throat:     Mouth: Mucous membranes are moist.  Eyes:     Extraocular Movements: Extraocular movements intact.     Conjunctiva/sclera: Conjunctivae normal.     Pupils: Pupils are equal, round, and reactive to light.  Cardiovascular:     Rate and Rhythm: Normal rate and regular rhythm.     Heart sounds: No murmur heard.    No friction rub. No gallop.  Pulmonary:     Effort: Pulmonary effort is normal.     Breath sounds: Normal breath sounds.  Abdominal:     General: Abdomen is flat.     Palpations: Abdomen is soft.  Musculoskeletal:        General: Normal range  of motion.  Skin:    General: Skin is warm and dry.  Neurological:     General: No focal deficit present.     Mental Status: She is alert and oriented to person, place, and time.  Psychiatric:        Mood and Affect: Mood normal.       Lab Results Lab Results  Component Value Date   WBC 8.0 10/24/2023   HGB 11.4 (L) 10/24/2023   HCT 36.1 10/24/2023  MCV 86.2 10/24/2023   PLT 267 10/24/2023    Lab Results  Component Value Date   CREATININE 0.94 10/24/2023   BUN 19 10/24/2023   NA 135 10/24/2023   K 4.0 10/24/2023   CL 98 10/24/2023   CO2 26 10/24/2023    Lab Results  Component Value Date   ALT 13 10/24/2023   AST 20 10/24/2023   GGT 173 (H) 11/21/2019   ALKPHOS 54 10/24/2023   BILITOT 0.5 10/24/2023        Danelle Earthly, MD [facility] 10/24/2023, 11:02 AM  I have personally spent 54 minutes involved in face-to-face and non-face-to-face activities for this patient on the day of the visit. Professional time spent includes the following activities: Preparing to see the patient (review of tests), Obtaining and/or reviewing separately obtained history (admission/discharge record), Performing a medically appropriate examination and/or evaluation , Ordering medications/tests/procedures, referring and communicating with other health care professionals, Documenting clinical information in the EMR, Independently interpreting results (not separately reported), Communicating results to the patient/family/caregiver, Counseling and educating the patient/family/caregiver and Care coordination (not separately reported).  e

## 2023-10-24 NOTE — Discharge Summary (Signed)
[facility]  Physician Discharge Summary   Patient ID: Karla Hoover MRN: [id number] DOB/AGE: 1971/02/14 53 y.o.  Admit date: 10/16/2023 Discharge date:   10/24/2023   PCP: Pcp, No  DISCHARGE DIAGNOSES:  Candidemia   Type 2 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 disease, with long-term current use of insulin (HCC)   Chronic respiratory failure with hypoxia (HCC)   COPD with chronic bronchitis (HCC)   Essential hypertension   Hemiparesis affecting left side as late effect of cerebrovascular accident (CVA) (HCC)   GERD without esophagitis   Hypothyroidism   Dyslipidemia    RECOMMENDATIONS FOR OUTPATIENT FOLLOW UP: Needs MRI abdomen with and without contrast in 4 weeks to follow up on renal mass Micafungin daily as mentioned below.   Home Health:Back to SNF  Equipment/Devices:None   CODE STATUS:Full Code   DISCHARGE CONDITION: fair  Diet recommendation: Carb modified  INITIAL HISTORY: 53 y.o. female with medical history significant of asthma, seasonal allergies, stage 3a CKD, bipolar disorder, class I obesity, tobacco abuse, COPD with chronic bronchitis and chronic respiratory failure with hypoxia, type II DM, hypertension, GERD, grade 1 diastolic dysfunction, hypothyroidism, hyperlipidemia, bilateral pulmonary embolism, DVT, history of other non-hemorrhagic stroke with residual left-sided hemiparesis and recent admission last month for ESBL E. coli and Klebsiella UTI treated with antibiotics presented with nausea, vomiting, fever, dysuria and suprapubic/flank pain.  On presentation, UA was suggestive of UTI; WBCs of 17, creatinine of 1.44 and lactic acid of 2.2.  Chest x-ray showed no active disease.  CT renal stone study showed no acute findings in the abdomen or pelvis with no nephrolithiasis or obstructive uropathy.  She was started on IV fluids and broad-spectrum antibiotics.  Blood cultures turned positive for Candida glabrata on 10/20/2023: ID was auto  consulted; meropenem discontinued and Mycamine was started.    HOSPITAL COURSE:   Fungemia/Sepsis: Present on admission/Lactic acidosis -Presented with fever, lactic acidosis, leukocytosis with possible UTI -Had ESBL E. coli during last admission.  Initially started on meropenem.  Urine cultures had insignificant growth.   Blood culture growing Staph hominis in 1 bottle: Possibly contaminant Blood cultures turned positive for Candida glabrata on 10/20/2023. ID was consulted.  Repeat blood cultures were ordered on 1/13 and negative so far. Patient was started on Mycamine. Echocardiogram did not show any concerning findings.  Subsequently she underwent TEE which also did not reveal any endocarditis. Seen by ophthalmology and there is no evidence for ocular infection at this time. ID recommends daily Micafungin until 11/02/23   Right renal mass Noted on CT scan.  Underwent MRI in which the renal lesion was poorly evaluated.  Recommendation is to repeat imaging study in a few weeks.  Can be pursued in the outpatient setting.   Diabetes mellitus type 2 with hyperglycemia Peripheral neuropathy -Carb modified diet. Continue insulin regimen. Dose of lantus adjusted. Continue gabapentin.    HbA1c 10.3 in November 2024.   Chronic respiratory failure with hypoxia/COPD On home oxygen.  Stable. Outpatient follow-up with pulmonary   Essential hypertension Continue losartan and atenolol.  Blood pressure is reasonably well-controlled with occasional high readings.   History of unspecified CVA with left-sided hemiparesis/Hyperlipidemia -Continue supportive care.  Outpatient follow-up with neurology.  Continue ezetimibe/statin.  PT recommended no PT follow-up   History of PE/DVT -Continue Eliquis   Grade 1 diastolic dysfunction -Continue beta-blocker and losartan    CKD stage IIIa -Creatinine currently stable.  Monitor   Anxiety/depression -Continue fluoxetine   Prolonged QT -Monitor on  telemetry.  Avoid QT prolonging meds as much as possible   Obesity Estimated body mass index is 31.97 kg/m as calculated from the following:   Height as of this encounter: 5' 7.5" (1.715 m).   Weight as of this encounter: 94 kg.   Leukocytosis -Resolved   Patient is stable. Ok for discharge.   PERTINENT LABS:  The results of significant diagnostics from this hospitalization (including imaging, microbiology, ancillary and laboratory) are listed below for reference.    Microbiology: Recent Results (from the past 240 hours)  Resp panel by RT-PCR (RSV, Flu A&B, Covid) Anterior Nasal Swab     Status: None   Collection Time: 10/16/23 11:17 PM   Specimen: Anterior Nasal Swab  Result Value Ref Range Status   SARS Coronavirus 2 by RT PCR NEGATIVE NEGATIVE Final    Comment: (NOTE) SARS-CoV-2 target nucleic acids are NOT DETECTED.  The SARS-CoV-2 RNA is generally detectable in upper respiratory specimens during the acute phase of infection. The lowest concentration of SARS-CoV-2 viral copies this assay can detect is 138 copies/mL. A negative result does not preclude SARS-Cov-2 infection and should not be used as the sole basis for treatment or other patient management decisions. A negative result may occur with  improper specimen collection/handling, submission of specimen other than nasopharyngeal swab, presence of viral mutation(s) within the areas targeted by this assay, and inadequate number of viral copies(<138 copies/mL). A negative result must be combined with clinical observations, patient history, and epidemiological information. The expected result is Negative.  Fact Sheet for Patients:  BloggerCourse.com  Fact Sheet for Healthcare Providers:  SeriousBroker.it  This test is no t yet approved or cleared by the [place] FDA and  has been authorized for detection and/or diagnosis of SARS-CoV-2 by FDA under an Emergency  Use Authorization (EUA). This EUA will remain  in effect (meaning this test can be used) for the duration of the COVID-19 declaration under Section 564(b)(1) of the Act, 21 U.S.C.section 360bbb-3(b)(1), unless the authorization is terminated  or revoked sooner.       Influenza A by PCR NEGATIVE NEGATIVE Final   Influenza B by PCR NEGATIVE NEGATIVE Final    Comment: (NOTE) The Xpert Xpress SARS-CoV-2/FLU/RSV plus assay is intended as an aid in the diagnosis of influenza from Nasopharyngeal swab specimens and should not be used as a sole basis for treatment. Nasal washings and aspirates are unacceptable for Xpert Xpress SARS-CoV-2/FLU/RSV testing.  Fact Sheet for Patients: BloggerCourse.com  Fact Sheet for Healthcare Providers: SeriousBroker.it  This test is not yet approved or cleared by the [place] FDA and has been authorized for detection and/or diagnosis of SARS-CoV-2 by FDA under an Emergency Use Authorization (EUA). This EUA will remain in effect (meaning this test can be used) for the duration of the COVID-19 declaration under Section 564(b)(1) of the Act, 21 U.S.C. section 360bbb-3(b)(1), unless the authorization is terminated or revoked.     Resp Syncytial Virus by PCR NEGATIVE NEGATIVE Final    Comment: (NOTE) Fact Sheet for Patients: BloggerCourse.com  Fact Sheet for Healthcare Providers: SeriousBroker.it  This test is not yet approved or cleared by the [place] FDA and has been authorized for detection and/or diagnosis of SARS-CoV-2 by FDA under an Emergency Use Authorization (EUA). This EUA will remain in effect (meaning this test can be used) for the duration of the COVID-19 declaration under Section 564(b)(1) of the Act, 21 U.S.C. section 360bbb-3(b)(1), unless the authorization is terminated or revoked.  Performed at [facility],  [address] [place], [place] [address]   Urine Culture     Status: Abnormal   Collection Time: 10/17/23  1:07 AM   Specimen: Urine, Random  Result Value Ref Range Status   Specimen Description   Final    URINE, RANDOM Performed at [facility], [address] [place], [place] [address]    Special Requests   Final    NONE Reflexed from [id number] Performed at [facility], [address], [place], [place] [address]    Culture (A)  Final    <10,000 COLONIES/mL INSIGNIFICANT GROWTH Performed at [facility], [address], [place], [place] [address]    Report Status 10/18/2023 FINAL  Final  Blood Culture (routine x 2)     Status: Abnormal (Preliminary result)   Collection Time: 10/17/23  1:18 AM   Specimen: BLOOD LEFT FOREARM  Result Value Ref Range Status   Specimen Description   Final    BLOOD LEFT FOREARM Performed at [facility], [address] [place], [place] [address]    Special Requests   Final    BOTTLES DRAWN AEROBIC AND ANAEROBIC Blood Culture adequate volume Performed at [facility], [address] [place], [place] [address]    Culture  Setup Time   Final    GRAM POSITIVE COCCI ANAEROBIC BOTTLE ONLY CRITICAL RESULT CALLED TO, READ BACK BY AND VERIFIED WITH: PHARMD E JACKSON 10/18/2023 @ 0003 BY AB YEAST AEROBIC BOTTLE ONLY CRITICAL RESULT CALLED TO, READ BACK BY AND VERIFIED WITH: PHARMD C DAVIS [id number] AT 1137 BY CM    Culture (A)  Final    STAPHYLOCOCCUS HOMINIS THE SIGNIFICANCE OF ISOLATING THIS ORGANISM FROM A SINGLE SET OF BLOOD CULTURES WHEN MULTIPLE SETS ARE DRAWN IS UNCERTAIN. PLEASE NOTIFY THE MICROBIOLOGY DEPARTMENT WITHIN ONE WEEK IF SPECIATION AND SENSITIVITIES ARE REQUIRED. CANDIDA GLABRATA Sent to [facility] for further susceptibility testing. Performed at [facility], [address] [place], [place] [address]    Report Status PENDING  Incomplete  Blood  Culture (routine x 2)     Status: None   Collection Time: 10/17/23  1:18 AM   Specimen: BLOOD RIGHT FOREARM  Result Value Ref Range Status   Specimen Description   Final    BLOOD RIGHT FOREARM Performed at [facility], [address] [place], [place] [address]    Special Requests   Final    BOTTLES DRAWN AEROBIC AND ANAEROBIC Blood Culture adequate volume Performed at [facility], [address], [place], [place] [address]    Culture   Final    NO GROWTH 5 DAYS Performed at [facility], [address], [place], [place] [address]    Report Status 10/22/2023 FINAL  Final  Blood Culture ID Panel (Reflexed)     Status: Abnormal   Collection Time: 10/17/23  1:18 AM  Result Value Ref Range Status   Enterococcus faecalis NOT DETECTED NOT DETECTED Final   Enterococcus Faecium NOT DETECTED NOT DETECTED Final   Listeria monocytogenes NOT DETECTED NOT DETECTED Final   Staphylococcus species DETECTED (A) NOT DETECTED Final    Comment: CRITICAL RESULT CALLED TO, READ BACK BY AND VERIFIED WITH: PHARMD E JACKSON 10/18/2023 @ 0003 BY AB    Staphylococcus aureus (BCID) NOT DETECTED NOT DETECTED Final   Staphylococcus epidermidis NOT DETECTED NOT DETECTED Final   Staphylococcus lugdunensis NOT DETECTED NOT DETECTED Final  Streptococcus species NOT DETECTED NOT DETECTED Final   Streptococcus agalactiae NOT DETECTED NOT DETECTED Final   Streptococcus pneumoniae NOT DETECTED NOT DETECTED Final   Streptococcus pyogenes NOT DETECTED NOT DETECTED Final   A.calcoaceticus-baumannii NOT DETECTED NOT DETECTED Final   Bacteroides fragilis NOT DETECTED NOT DETECTED Final   Enterobacterales NOT DETECTED NOT DETECTED Final   Enterobacter cloacae complex NOT DETECTED NOT DETECTED Final   Escherichia coli NOT DETECTED NOT DETECTED Final   Klebsiella aerogenes NOT DETECTED NOT DETECTED Final   Klebsiella oxytoca NOT DETECTED NOT DETECTED Final   Klebsiella  pneumoniae NOT DETECTED NOT DETECTED Final   Proteus species NOT DETECTED NOT DETECTED Final   Salmonella species NOT DETECTED NOT DETECTED Final   Serratia marcescens NOT DETECTED NOT DETECTED Final   Haemophilus influenzae NOT DETECTED NOT DETECTED Final   Neisseria meningitidis NOT DETECTED NOT DETECTED Final   Pseudomonas aeruginosa NOT DETECTED NOT DETECTED Final   Stenotrophomonas maltophilia NOT DETECTED NOT DETECTED Final   Candida albicans NOT DETECTED NOT DETECTED Final   Candida auris NOT DETECTED NOT DETECTED Final   Candida glabrata NOT DETECTED NOT DETECTED Final   Candida krusei NOT DETECTED NOT DETECTED Final   Candida parapsilosis NOT DETECTED NOT DETECTED Final   Candida tropicalis NOT DETECTED NOT DETECTED Final   Cryptococcus neoformans/gattii NOT DETECTED NOT DETECTED Final    Comment: Performed at [facility], [address] [place], [place] [address]  Blood Culture ID Panel (Reflexed)     Status: Abnormal   Collection Time: 10/17/23  1:18 AM  Result Value Ref Range Status   Enterococcus faecalis NOT DETECTED NOT DETECTED Final   Enterococcus Faecium NOT DETECTED NOT DETECTED Final   Listeria monocytogenes NOT DETECTED NOT DETECTED Final   Staphylococcus species NOT DETECTED NOT DETECTED Final   Staphylococcus aureus (BCID) NOT DETECTED NOT DETECTED Final   Staphylococcus epidermidis NOT DETECTED NOT DETECTED Final   Staphylococcus lugdunensis NOT DETECTED NOT DETECTED Final   Streptococcus species NOT DETECTED NOT DETECTED Final   Streptococcus agalactiae NOT DETECTED NOT DETECTED Final   Streptococcus pneumoniae NOT DETECTED NOT DETECTED Final   Streptococcus pyogenes NOT DETECTED NOT DETECTED Final   A.calcoaceticus-baumannii NOT DETECTED NOT DETECTED Final   Bacteroides fragilis NOT DETECTED NOT DETECTED Final   Enterobacterales NOT DETECTED NOT DETECTED Final   Enterobacter cloacae complex NOT DETECTED NOT DETECTED Final   Escherichia coli NOT  DETECTED NOT DETECTED Final   Klebsiella aerogenes NOT DETECTED NOT DETECTED Final   Klebsiella oxytoca NOT DETECTED NOT DETECTED Final   Klebsiella pneumoniae NOT DETECTED NOT DETECTED Final   Proteus species NOT DETECTED NOT DETECTED Final   Salmonella species NOT DETECTED NOT DETECTED Final   Serratia marcescens NOT DETECTED NOT DETECTED Final   Haemophilus influenzae NOT DETECTED NOT DETECTED Final   Neisseria meningitidis NOT DETECTED NOT DETECTED Final   Pseudomonas aeruginosa NOT DETECTED NOT DETECTED Final   Stenotrophomonas maltophilia NOT DETECTED NOT DETECTED Final   Candida albicans NOT DETECTED NOT DETECTED Final   Candida auris NOT DETECTED NOT DETECTED Final   Candida glabrata DETECTED (A) NOT DETECTED Final    Comment: CRITICAL RESULT CALLED TO, READ BACK BY AND VERIFIED WITH: PHARMD C DAVIS [id number] AT 1137 BY CM    Candida krusei NOT DETECTED NOT DETECTED Final   Candida parapsilosis NOT DETECTED NOT DETECTED Final   Candida tropicalis NOT DETECTED NOT DETECTED Final   Cryptococcus neoformans/gattii NOT DETECTED NOT DETECTED Final    Comment:  Performed at [facility], [address] [place], [place] [address]  Culture, blood (Routine X 2) w Reflex to ID Panel     Status: None (Preliminary result)   Collection Time: 10/20/23  2:06 PM   Specimen: BLOOD  Result Value Ref Range Status   Specimen Description   Final    BLOOD SITE NOT SPECIFIED Performed at [facility], [address] [place] [address] [place], [place] [address]    Special Requests   Final    BOTTLES DRAWN AEROBIC ONLY Blood Culture results may not be optimal due to an inadequate volume of blood received in culture bottles Performed at [facility], [address] [place], [place] [address]    Culture   Final    NO GROWTH 4 DAYS Performed at [facility], [address] [place], [place] [address]    Report Status PENDING  Incomplete  Culture, blood (Routine X 2) w  Reflex to ID Panel     Status: None (Preliminary result)   Collection Time: 10/20/23  2:14 PM   Specimen: BLOOD  Result Value Ref Range Status   Specimen Description   Final    BLOOD SITE NOT SPECIFIED Performed at [facility], [address] [place], [place] [address]    Special Requests   Final    BOTTLES DRAWN AEROBIC ONLY Blood Culture results may not be optimal due to an inadequate volume of blood received in culture bottles Performed at [facility], [address], [place], [place] [address]    Culture   Final    NO GROWTH 4 DAYS Performed at [facility], [address] [place], [place] [address]    Report Status PENDING  Incomplete     Labs:   Basic Metabolic Panel: Recent Labs  Lab 10/18/23 0521 10/19/23 0448 10/20/23 0422 10/22/23 0411 10/24/23 0410  NA 136 137 137 137 135  K 4.3 4.2 4.3 4.0 4.0  CL 100 100 96* 98 98  CO2 28 28 29 28 26   GLUCOSE 163* 181* 152* 206* 171*  BUN 21* 15 12 18 19   CREATININE 1.41* 1.10* 1.02* 0.93 0.94  CALCIUM 8.7* 8.8* 9.4 9.2 9.2  MG  --  1.9 1.8 2.4 2.4   Liver Function Tests: Recent Labs  Lab 10/18/23 0521 10/24/23 0410  AST 14* 20  ALT 11 13  ALKPHOS 47 54  BILITOT 0.8 0.5  PROT 5.9* 6.6  ALBUMIN 2.6* 3.3*    CBC: Recent Labs  Lab 10/17/23 1948 10/18/23 0521 10/19/23 0448 10/22/23 0411 10/24/23 0410  WBC  --  10.1 7.2 7.3 8.0  NEUTROABS  --  6.2 3.8 3.6  --   HGB 11.4* 10.7* 10.2* 11.7* 11.4*  HCT 34.8* 34.6* 32.3* 36.9 36.1  MCV  --  88.7 87.5 85.4 86.2  PLT  --  166 180 249 267    CBG: Recent Labs  Lab 10/23/23 1211 10/23/23 1659 10/23/23 2112 10/24/23 0738 10/24/23 1142  GLUCAP 151* 128* 243* 155* 234*     IMAGING STUDIES ECHO TEE Result Date: 10/23/2023    TRANSESOPHOGEAL ECHO REPORT   Patient Name:   INDIKA COLTRAIN Date of Exam: 10/23/2023 Medical Rec #:  [id number]         Height:       67.5 in Accession #:    [id number]        Weight:        207.2 lb  Date of Birth:  Sep 04, 1971         BSA:          2.064 m Patient Age:    52 years          BP:           108/71 mmHg Patient Gender: F                 HR:           60 bpm. Exam Location:  Inpatient Procedure: Transesophageal Echo, Color Doppler and Cardiac Doppler Indications:    Bacteremia  History:        Patient has prior history of Echocardiogram examinations, most                 recent 10/20/2023.  Sonographer:    Harriette Bouillon RDCS Referring Phys: [id number] [facility] PROCEDURE: The transesophogeal probe was passed without difficulty through the esophogus of the patient. Sedation performed by different physician. The patient's vital signs; including heart rate, blood pressure, and oxygen saturation; remained stable throughout the procedure. The patient developed no complications during the procedure.  IMPRESSIONS  1. Left ventricular ejection fraction, by estimation, is 60 to 65%. The left ventricle has normal function. The left ventricle has no regional wall motion abnormalities.  2. Right ventricular systolic function is normal. The right ventricular size is normal.  3. No left atrial/left atrial appendage thrombus was detected. The LAA emptying velocity was 34 cm/s.  4. The mitral valve is abnormal. Mild mitral valve regurgitation. No evidence of mitral stenosis. 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  5. The aortic valve is tricuspid. Aortic valve regurgitation is not visualized. No aortic stenosis is present.  6. The inferior vena cava is normal in size with greater than 50% respiratory variability, suggesting right atrial pressure of 3 mmHg. Conclusion(s)/Recommendation(s): Normal biventricular function without evidence of hemodynamically significant valvular heart disease. FINDINGS  Left Ventricle: Left ventricular ejection fraction, by estimation, is 60 to 65%. The left ventricle has normal function. The left ventricle has no regional wall motion abnormalities. The  left ventricular internal cavity size was normal in size. There is  no left ventricular hypertrophy. Right Ventricle: The right ventricular size is normal. No increase in right ventricular wall thickness. Right ventricular systolic function is normal. Left Atrium: Left atrial size was normal in size. No left atrial/left atrial appendage thrombus was detected. The LAA emptying velocity was 34 cm/s. Right Atrium: Right atrial size was normal in size. Pericardium: There is no evidence of pericardial effusion. Mitral Valve: The mitral valve is abnormal. 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 Mild mitral valve regurgitation. No evidence of mitral valve stenosis. Tricuspid Valve: The tricuspid valve is normal in structure. Tricuspid valve regurgitation is mild . No evidence of tricuspid stenosis. Aortic Valve: The aortic valve is tricuspid. Aortic valve regurgitation is not visualized. No aortic stenosis is present. Pulmonic Valve: The pulmonic valve was normal in structure. Pulmonic valve regurgitation is not visualized. No evidence of pulmonic stenosis. Aorta: The aortic root is normal in size and structure. Pulmonary Artery: The pulmonary artery is of normal size. Venous: The right upper pulmonary vein, right lower pulmonary vein, left lower pulmonary vein and left upper pulmonary vein are normal. A normal flow pattern is recorded from the left lower pulmonary vein. The inferior vena cava is normal in size with greater than 50% respiratory variability, suggesting right atrial pressure of 3  mmHg. IAS/Shunts: No atrial level shunt detected by color flow Doppler.   AORTA Ao Asc diam: 3.30 cm Lavona Mound Tobb DO Electronically signed by Thomasene Ripple DO Signature Date/Time: 10/23/2023/3:26:57 PM    Final    EP STUDY Result Date: 10/23/2023 See surgical note for result.  MR ABDOMEN W WO CONTRAST Result Date: 10/21/2023 CLINICAL DATA:  Renal mass on CT EXAM: MRI ABDOMEN WITHOUT AND WITH CONTRAST  TECHNIQUE: Multiplanar multisequence MR imaging of the abdomen was performed both before and after the administration of intravenous contrast. CONTRAST:  9mL GADAVIST GADOBUTROL 1 MMOL/ML IV SOLN COMPARISON:  CT abdomen/pelvis dated 10/20/2023 FINDINGS: Motion degraded images. Lower chest: Lung bases are clear. Hepatobiliary: Mild hepatic steatosis. Liver is otherwise within normal limits. Gallbladder is unremarkable. No intrahepatic or extrahepatic duct dilatation. Pancreas:  Within normal limits. Spleen:  Within normal limits. Adrenals/Urinary Tract:  Adrenal glands are within normal limits. Left kidney is within normal limits. Right renal cortical scarring/lobulation. Given motion degradation, the rounded area on CT is poorly evaluated, but mild restricted diffusion is suspected (series 6/image 22) with subtle differential enhancement (series 11/image 50). As such, small solid renal neoplasm is difficult to exclude, but not well characterized on the current study. For reference, this lesion measures approximately 13 mm on MR (series 3/image 24). No hydronephrosis. Stomach/Bowel: Stomach is within normal limits. Visualized bowel is unremarkable. Vascular/Lymphatic:  No evidence of abdominal aortic aneurysm. No suspicious abdominal lymphadenopathy. Other:  No abdominal ascites. Musculoskeletal: No focal osseous lesions. IMPRESSION: Motion degraded images. 13 mm right renal lesion is poorly evaluated, but small solid renal neoplasm is difficult to exclude, although not well characterized on the current study. Consider follow-up CT or MRI abdomen with/without contrast in 4-6 weeks. Mild hepatic steatosis. Electronically Signed   By: Charline Bills M.D.   On: 10/21/2023 23:26   CT ABDOMEN PELVIS W CONTRAST Result Date: 10/20/2023 CLINICAL DATA:  infection.  Sepsis secondary to UTI. EXAM: CT ABDOMEN AND PELVIS WITH CONTRAST TECHNIQUE: Multidetector CT imaging of the abdomen and pelvis was performed using the  standard protocol following bolus administration of intravenous contrast. RADIATION DOSE REDUCTION: This exam was performed according to the departmental dose-optimization program which includes automated exposure control, adjustment of the mA and/or kV according to patient size and/or use of iterative reconstruction technique. CONTRAST:  OMNIPAQUE IOHEXOL 300 MG/ML  SOLN COMPARISON:  CT abdomen pelvis 12/23/2021, CT abdomen pelvis 01/02/2022, CT abdomen pelvis 04/08/2023, CT renal 10/17/2023 FINDINGS: Lower chest: Bilateral trace pleural effusions. Hepatobiliary: The hepatic parenchyma is diffusely hypodense compared to the splenic parenchyma consistent with fatty infiltration. No focal liver abnormality. No gallstones, gallbladder wall thickening, or pericholecystic fluid. No biliary dilatation. Pancreas: No focal lesion. Normal pancreatic contour. No surrounding inflammatory changes. No main pancreatic ductal dilatation. Spleen: Normal in size without focal abnormality. Adrenals/Urinary Tract: No adrenal nodule bilaterally. Bilateral kidneys enhance symmetrically. Right renal cortical scarring. Possible partial nephrectomy. Fluid dense lesion within the right kidney likely represents a simple renal cyst. Simple renal cysts, in the absence of clinically indicated signs/symptoms, require no independent follow-up. There is a 1.6 cm heterogeneous enhancing right renal mass better evaluated on coronal imaging (5:76). No hydronephrosis. No hydroureter.  No nephroureterolithiasis. The urinary bladder is unremarkable. Stomach/Bowel: Stomach is within normal limits. No evidence of bowel wall thickening or dilatation. Stool throughout the colon appendix appears normal. Vascular/Lymphatic: No abdominal aorta or iliac aneurysm. Severe atherosclerotic plaque of the aorta and its branches. Interval development of a borderline enlarged 1.1 cm  retroperitoneal left periaortic lymph node (2:46). No pelvic or inguinal  lymphadenopathy. Reproductive: Uterus and bilateral adnexa are unremarkable. Other: No intraperitoneal free fluid. No intraperitoneal free gas. No organized fluid collection. Musculoskeletal: No abdominal wall hernia or abnormality. No suspicious lytic or blastic osseous lesions. No acute displaced fracture. IMPRESSION: 1. A 1.6 cm heterogeneous enhancing right renal mass. Finding concerning for malignancy. When the patient is clinically stable and able to follow directions and hold their breath (preferably as an outpatient) further evaluation with dedicated MRI renal protocol should be considered. 2. Bilateral trace pleural effusions. 3. Hepatic steatosis. 4. Stool throughout the colon-correlate for constipation. 5.  Aortic Atherosclerosis (ICD10-I70.0). Electronically Signed   By: Tish Frederickson M.D.   On: 10/20/2023 20:51   ECHOCARDIOGRAM COMPLETE Result Date: 10/20/2023    ECHOCARDIOGRAM REPORT   Patient Name:   JAMAIYAH RECHNER Date of Exam: 10/20/2023 Medical Rec #:  [id number]         Height:       67.5 in Accession #:    [id number]        Weight:       207.2 lb Date of Birth:  07-18-1971         BSA:          2.064 m Patient Age:    52 years          BP:           158/78 mmHg Patient Gender: F                 HR:           75 bpm. Exam Location:  Inpatient Procedure: 2D Echo, Cardiac Doppler and Color Doppler Indications:    Endocarditis  History:        Patient has prior history of Echocardiogram examinations, most                 recent 09/08/2023. Risk Factors:Hypertension, Diabetes,                 Dyslipidemia and Former Smoker.  Sonographer:    Karma Ganja Referring Phys: [id number] [facility] IMPRESSIONS  1. Left ventricular ejection fraction, by estimation, is 55 to 60%. The left ventricle has normal function. The left ventricle has no regional wall motion abnormalities. There is mild concentric left ventricular hypertrophy. Left ventricular diastolic parameters are consistent with Grade I  diastolic dysfunction (impaired relaxation).  2. Right ventricular systolic function is normal. The right ventricular size is normal.  3. No evidence of mitral valve regurgitation.  4. The aortic valve was not well visualized. Aortic valve regurgitation is not visualized.  5. The inferior vena cava is normal in size with greater than 50% respiratory variability, suggesting right atrial pressure of 3 mmHg. FINDINGS  Left Ventricle: Left ventricular ejection fraction, by estimation, is 55 to 60%. The left ventricle has normal function. The left ventricle has no regional wall motion abnormalities. The left ventricular internal cavity size was normal in size. There is  mild concentric left ventricular hypertrophy. Left ventricular diastolic parameters are consistent with Grade I diastolic dysfunction (impaired relaxation). Right Ventricle: The right ventricular size is normal. Right ventricular systolic function is normal. Left Atrium: Left atrial size was normal in size. Right Atrium: Right atrial size was normal in size. Pericardium: There is no evidence of pericardial effusion. Mitral Valve: No evidence of mitral valve regurgitation. Tricuspid Valve: Tricuspid valve regurgitation is not demonstrated. Aortic Valve: The aortic valve was  not well visualized. Aortic valve regurgitation is not visualized. Aortic valve mean gradient measures 5.0 mmHg. Aortic valve peak gradient measures 9.7 mmHg. Aortic valve area, by VTI measures 1.76 cm. Pulmonic Valve: Pulmonic valve regurgitation is not visualized. Aorta: The aortic root and ascending aorta are structurally normal, with no evidence of dilitation. Venous: The inferior vena cava is normal in size with greater than 50% respiratory variability, suggesting right atrial pressure of 3 mmHg. IAS/Shunts: The interatrial septum was not well visualized.  LEFT VENTRICLE PLAX 2D LVIDd:         4.40 cm   Diastology LVIDs:         3.10 cm   LV e' medial:    6.53 cm/s LV PW:          1.20 cm   LV E/e' medial:  10.4 LV IVS:        1.20 cm   LV e' lateral:   7.07 cm/s LVOT diam:     2.00 cm   LV E/e' lateral: 9.6 LV SV:         52 LV SV Index:   25 LVOT Area:     3.14 cm  RIGHT VENTRICLE            IVC RV Basal diam:  3.30 cm    IVC diam: 1.70 cm RV S prime:     9.68 cm/s TAPSE (M-mode): 3.0 cm LEFT ATRIUM             Index        RIGHT ATRIUM           Index LA diam:        4.10 cm 1.99 cm/m   RA Area:     16.10 cm LA Vol (A2C):   58.8 ml 28.49 ml/m  RA Volume:   43.40 ml  21.02 ml/m LA Vol (A4C):   43.0 ml 20.83 ml/m LA Biplane Vol: 50.3 ml 24.37 ml/m  AORTIC VALVE AV Area (Vmax):    1.89 cm AV Area (Vmean):   1.83 cm AV Area (VTI):     1.76 cm AV Vmax:           156.00 cm/s AV Vmean:          106.000 cm/s AV VTI:            0.298 m AV Peak Grad:      9.7 mmHg AV Mean Grad:      5.0 mmHg LVOT Vmax:         94.00 cm/s LVOT Vmean:        61.600 cm/s LVOT VTI:          0.167 m LVOT/AV VTI ratio: 0.56  AORTA Ao Root diam: 2.90 cm Ao Asc diam:  2.30 cm MITRAL VALVE MV Area (PHT): 3.68 cm    SHUNTS MV Decel Time: 206 msec    Systemic VTI:  0.17 m MV E velocity: 68.10 cm/s  Systemic Diam: 2.00 cm MV A velocity: 77.60 cm/s MV E/A ratio:  0.88 [place] signed by Carolan Clines Signature Date/Time: 10/20/2023/3:01:36 PM    Final    CT Renal Stone Study Result Date: 10/17/2023 CLINICAL DATA:  Abdominal/flank pain.  Stone suspected. EXAM: CT ABDOMEN AND PELVIS WITHOUT CONTRAST TECHNIQUE: Multidetector CT imaging of the abdomen and pelvis was performed following the standard protocol without IV contrast. RADIATION DOSE REDUCTION: This exam was performed according to the departmental dose-optimization program which includes automated exposure control, adjustment  of the mA and/or kV according to patient size and/or use of iterative reconstruction technique. COMPARISON:  Renal ultrasound 09/07/2023, CT abdomen pelvis without contrast 04/08/2023 and 09/07/2022. FINDINGS: Lower chest:  There is mild bronchial thickening in the lower lobes without infiltrates. Scattered linear scar-like opacities. The cardiac size is normal. Hepatobiliary: The liver is 21 cm length and mildly steatotic. Without contrast is otherwise unremarkable. The gallbladder and bile ducts are unremarkable. Pancreas: Partially atrophic otherwise unremarkable without contrast. Spleen: Unremarkable without contrast.  No splenomegaly. Adrenals/Urinary Tract: There is no adrenal mass. Right kidney is small and chronically atrophic. Again noted is a Bosniak 1 cyst in the superior pole of the right kidney measuring 1.3 cm, Hounsfield density is 15.2. This is unchanged requiring no follow-up. No intrarenal stones are seen either side. There is no hydronephrosis or ureteral stones no contour deforming abnormality of the unenhanced kidneys. The bladder is unremarkable for the degree of distention. Stomach/Bowel: No dilatation or wall thickening including the appendix. There is sigmoid diverticulosis without focal diverticulitis. Vascular/Lymphatic: Aortic atherosclerosis. No enlarged abdominal or pelvic lymph nodes. Reproductive: Uterus and bilateral adnexa are unremarkable. Other: Small umbilical and bilateral inguinal fat hernias. No incarcerated hernia. No free air or free fluid. Musculoskeletal: No acute or significant osseous findings. IMPRESSION: 1. No acute noncontrast CT findings in the abdomen or pelvis. 2. No nephrolithiasis or obstructive uropathy. 3. Chronically atrophic right kidney. 4. Mildly enlarged steatotic liver. 5. Sigmoid diverticulosis without evidence of diverticulitis. 6. Aortic atherosclerosis. 7. Mild bronchial thickening in the lower lobes. Aortic Atherosclerosis (ICD10-I70.0). Electronically Signed   By: Almira Bar M.D.   On: 10/17/2023 03:12   DG Chest Port 1 View Result Date: 10/17/2023 CLINICAL DATA:  Nausea, vomiting, chills EXAM: PORTABLE CHEST 1 VIEW COMPARISON:  09/08/2023 FINDINGS: Stable  cardiomediastinal silhouette. No focal consolidation, pleural effusion, or pneumothorax. No displaced rib fractures. IMPRESSION: No active disease. Electronically Signed   By: Minerva Fester M.D.   On: 10/17/2023 00:24    DISCHARGE EXAMINATION: Please see progress note from earlier today.  DISPOSITION: SNF  Discharge Instructions     Call MD for:  difficulty breathing, headache or visual disturbances   Complete by: As directed    Call MD for:  extreme fatigue   Complete by: As directed    Call MD for:  persistant dizziness or light-headedness   Complete by: As directed    Call MD for:  persistant nausea and vomiting   Complete by: As directed    Call MD for:  severe uncontrolled pain   Complete by: As directed    Call MD for:  temperature >100.4   Complete by: As directed    Discharge instructions   Complete by: As directed    Please review instructions on DC summary.  You were cared for by a hospitalist during your hospital stay. If you have any questions about your discharge medications or the care you received while you were in the hospital after you are discharged, you can call the unit and asked to speak with the hospitalist on call if the hospitalist that took care of you is not available. Once you are discharged, your primary care physician will handle any further medical issues. Please note that NO REFILLS for any discharge medications will be authorized once you are discharged, as it is imperative that you return to your primary care physician (or establish a relationship with a primary care physician if you do not have one) for your aftercare needs so  that they can reassess your need for medications and monitor your lab values. If you do not have a primary care physician, you can call [phone] for a physician referral.   Home infusion instructions   Complete by: As directed    Micafungin to be given at SNF per SNF protocol   Instructions: Flushing of vascular access device:  0.9% NaCl pre/post medication administration and prn patency; Heparin 100 u/ml, 5ml for implanted ports and Heparin 10u/ml, 5ml for all other central venous catheters.   Increase activity slowly   Complete by: As directed         Allergies as of 10/24/2023       Reactions   Cipro [ciprofloxacin Hcl] Hives   Guaifenesin Anaphylaxis   Kiwi Extract Anaphylaxis, Rash   Levaquin [levofloxacin] Shortness Of Breath, Rash   Strawberry Extract Anaphylaxis, Rash   Lioresal [baclofen] Other (See Comments)   Near syncope/ fall        Medication List     TAKE these medications    acetaminophen 500 MG tablet Commonly known as: TYLENOL Take 1,000 mg by mouth 3 (three) times daily.   albuterol 108 (90 Base) MCG/ACT inhaler Commonly known as: VENTOLIN HFA Inhale 2 puffs into the lungs every 6 (six) hours as needed for wheezing or shortness of breath.   apixaban 5 MG Tabs tablet Commonly known as: Eliquis Take 1 tablet (5mg ) twice daily What changed:  how much to take how to take this when to take this additional instructions   aspirin 81 MG chewable tablet Chew 81 mg by mouth in the morning.   atenolol 50 MG tablet Commonly known as: TENORMIN Take 50 mg by mouth 2 (two) times daily.   atorvastatin 80 MG tablet Commonly known as: LIPITOR Take 1 tablet (80 mg total) by mouth daily. What changed: when to take this   B Complex-Biotin-FA Tabs Take 1 tablet by mouth in the morning.   barrier cream Crea Commonly known as: non-specified Apply 1 Application topically See admin instructions. Apply to affected areas 2 times a day   chlorhexidine 0.12 % solution Commonly known as: PERIDEX Use as directed 15 mLs in the mouth or throat See admin instructions. Rinse the mouth with 15 ml's once a day as directed, then spit out   clonazePAM 0.5 MG tablet Commonly known as: KLONOPIN Take 1 tablet (0.5 mg total) by mouth 2 (two) times daily.   cyclobenzaprine 10 MG tablet Commonly  known as: FLEXERIL Take 1 tablet (10 mg total) by mouth 3 (three) times daily as needed for muscle spasms.   EpiPen 2-Pak 0.3 mg/0.3 mL Soaj injection Generic drug: EPINEPHrine Inject 0.3 mg into the muscle daily as needed for anaphylaxis.   ezetimibe 10 MG tablet Commonly known as: ZETIA Take 10 mg by mouth at bedtime.   Fish Oil 1000 MG Caps Take 1,000 mg by mouth 3 (three) times daily.   FLUoxetine 20 MG capsule Commonly known as: PROZAC Take 20 mg by mouth daily.   gabapentin 400 MG capsule Commonly known as: NEURONTIN Take 1 capsule (400 mg total) by mouth 3 (three) times daily.   hydrALAZINE 10 MG tablet Commonly known as: APRESOLINE Take 10 mg by mouth every 12 (twelve) hours as needed (for a Systolic reading >130 or Diastolic reading >86).   Incruse Ellipta 62.5 MCG/ACT Aepb Generic drug: umeclidinium bromide Inhale 1 puff into the lungs daily.   insulin glargine 100 UNIT/ML injection Commonly known as: LANTUS Inject 0.12 mLs (12  Units total) into the skin at bedtime. What changed:  how much to take when to take this   insulin lispro 100 UNIT/ML injection Commonly known as: HUMALOG Inject 0-14 Units into the skin See admin instructions. Inject 0-14 units twice daily as per sliding scale : CBG 0-200 : 0 units CBG 201-250 : 2 units CBG 251-300 : 4 units CBG 301-350 : 6 units CBG 351-400 : 8 units CBG 401-450 : 10 units CBG 451-500 : 12 units (if BP reads high, inject 14 units)   LACTOBACILLUS PO Take 1 capsule by mouth daily.   levothyroxine 88 MCG tablet Commonly known as: SYNTHROID Take 88 mcg by mouth in the morning.   losartan 25 MG tablet Commonly known as: COZAAR Take 37.5 mg by mouth daily.   Macrobid 100 MG capsule Generic drug: nitrofurantoin (macrocrystal-monohydrate) Take 100 mg by mouth in the morning.   magnesium oxide 400 (240 Mg) MG tablet Commonly known as: MAG-OX Take 400 mg by mouth every other day.   Melatonin 5 MG Tbdp Take  5 mg by mouth at bedtime.   micafungin 50 MG injection Commonly known as: MYCAMINE Inject 100 mg into the vein daily for 9 days. Indication:  Candida glabrata fungemia First Dose: Received inpatient Last Day of Therapy:  11/02/23 Labs - Once weekly:  CBC/D and BMP, Labs - Once weekly: ESR and CRP Method of administration: Elastomeric or per SNF protocol   Nasal Moist Gel Place 1 application  into both nostrils at bedtime.   omeprazole 20 MG capsule Commonly known as: PRILOSEC Take 20 mg by mouth in the morning.   ondansetron 4 MG tablet Commonly known as: ZOFRAN Take 4 mg by mouth 2 (two) times daily.   OXYGEN Inhale 2 L/min into the lungs as needed (for shortness of breath).   polyethylene glycol powder 17 GM/SCOOP powder Commonly known as: GLYCOLAX/MIRALAX Take 17 g by mouth daily.   QUEtiapine 25 MG tablet Commonly known as: SEROQUEL Take 1 tablet (25 mg total) by mouth at bedtime.   rOPINIRole 0.25 MG tablet Commonly known as: REQUIP Take 1 tablet (0.25 mg total) by mouth at bedtime.   senna-docusate 8.6-50 MG tablet Commonly known as: Senokot-S Take 1 tablet by mouth 2 (two) times daily.   Symbicort 80-4.5 MCG/ACT inhaler Generic drug: budesonide-formoterol Inhale 2 puffs into the lungs 2 (two) times daily.   Trulicity 1.5 MG/0.5ML Soaj Generic drug: Dulaglutide Inject 1.5 mg into the skin every Friday.   Vitamin D3 1.25 MG (50000 UT) Caps Take 50,000 Units by mouth every Thursday.   Voltaren 1 % Gel Generic drug: diclofenac Sodium Apply 2 g topically See admin instructions. Apply 2 grams to the posterior neck  2 times a day   witch hazel-glycerin pad Commonly known as: TUCKS Apply 1 Application topically every 4 (four) hours as needed for hemorrhoids.               Home Infusion Instuctions  (From admission, onward)           Start     Ordered   10/24/23 0000  Home infusion instructions       Comments: Micafungin to be given at SNF per  SNF protocol  Question:  Instructions  Answer:  Flushing of vascular access device: 0.9% NaCl pre/post medication administration and prn patency; Heparin 100 u/ml, 5ml for implanted ports and Heparin 10u/ml, 5ml for all other central venous catheters.   10/24/23 1115  TOTAL DISCHARGE TIME: 35 mins  Jameisha Stofko [employer] on [email or web address]  10/24/2023, 3:18 PM

## 2023-10-25 LAB — CULTURE, BLOOD (ROUTINE X 2)
Culture: NO GROWTH
Culture: NO GROWTH

## 2023-10-27 ENCOUNTER — Encounter: Payer: Self-pay | Admitting: Physical Medicine and Rehabilitation

## 2023-10-27 LAB — MISC LABCORP TEST (SEND OUT): Labcorp test code: 183257

## 2023-10-29 LAB — ANTIFUNGAL AST 9 DRUG PANEL

## 2023-10-30 LAB — CULTURE, BLOOD (ROUTINE X 2): Special Requests: ADEQUATE

## 2023-11-03 ENCOUNTER — Inpatient Hospital Stay: Payer: Medicaid Other | Admitting: Internal Medicine

## 2023-11-03 NOTE — Progress Notes (Deleted)
 Patient: Karla Hoover  DOB: [birth year] MRN: [id number] PCP: Pcp, No  Referring Provider: ***  No chief complaint on file.    Patient Active Problem List   Diagnosis Date Noted   Aggressive behavior 10/17/2023   Prolonged QT interval 10/17/2023   UTI due to extended-spectrum beta lactamase (ESBL) producing Escherichia coli 09/06/2023   Acute metabolic encephalopathy 09/06/2023   Acute on chronic respiratory failure with hypoxia and hypercapnia (HCC) 09/06/2023   Hypotension 09/06/2023   UTI (urinary tract infection) 01/21/2023   Leukocytosis 01/21/2023   Class 1 obesity due to excess calories with body mass index (BMI) of 32.0 to 32.9 in adult 12/18/2022   Dyslipidemia 12/18/2022   Sepsis due to urinary tract infection (HCC) 12/13/2022   Hyponatremia 12/13/2022   CKD (chronic kidney disease) stage 3, GFR 30-59 ml/min (HCC) 12/13/2022   Sepsis (HCC) 09/07/2022   AKI (acute kidney injury) (HCC) 08/30/2022   History of pulmonary embolism 08/30/2022   Hemiparesis affecting left side as late effect of cerebrovascular accident (CVA) (HCC) 08/30/2022   Allergies    Grade I diastolic dysfunction 12/02/2021   Mild protein-calorie malnutrition (HCC) 12/02/2021   COPD with acute exacerbation (HCC) 12/02/2021   Acute renal failure superimposed on stage 3a chronic kidney disease (HCC) 02/09/2021   Chronic respiratory failure with hypoxia (HCC) 02/09/2021   History of thromboembolism 02/09/2021   Type 2 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 disease, with long-term current use of insulin (HCC) 02/09/2021   Mixed 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 (HCC) 02/09/2021   GERD without esophagitis 02/09/2021   Hypothyroidism 02/09/2021   Sepsis secondary to UTI (HCC) 02/08/2021   Lymphadenopathy 12/28/2020   hx of PE/DVT 12/01/2019   Overweight (BMI 25.0-29.9) 11/22/2019   Tobacco abuse 11/16/2019   DM (diabetes mellitus), type 2 (HCC) 11/15/2019    History of stroke 11/15/2019   Depression 02/15/2018   Anxiety 01/24/2017   Essential hypertension 05/04/2009   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 coli UTI on suppressive Macrobid, diabetes mellitus, CKD, tobacco abuse presents for hospital follow-up of Candida glabrata fungemia secondary unclear source.  On admission blood cultures grew Candida glabrata 1/2 sets.  Urine cultures did not grow Candida.  CT renal study showed no nephrolithiasis, obstruction uropathy and no acute findings.  CT with con showed right renal mass recommend MRI renal study.  MRI renal protocol was motion degraded, repeat CT MRI in 4 to 6 weeks recommended.  Ophtho eval was benign.  TTE and TEE without vegetation.  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   Former smoker 36 pack year smoking history     Diabetes mellitus without complication (HCC)    Essential hypertension 05/04/2009   Qualifier: Diagnosis of  By: Roxan Hockey CMA, Jessica     GERD without esophagitis 02/09/2021   Grade I diastolic dysfunction 12/02/2021   Hypertension    Hypothyroidism 02/09/2021   Mixed 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 (HCC) 02/09/2021  Pulmonary embolism (HCC) 11/16/2019   Stroke (HCC)    Type 2 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 disease, with long-term current use of insulin (HCC) 02/09/2021    Outpatient Medications Prior to Visit  Medication Sig Dispense Refill   acetaminophen (TYLENOL) 500 MG tablet Take  1,000 mg by mouth 3 (three) times daily.     albuterol (VENTOLIN HFA) 108 (90 Base) MCG/ACT inhaler Inhale 2 puffs into the lungs every 6 (six) hours as needed for wheezing or shortness of breath. 8 g 1   apixaban (ELIQUIS) 5 MG TABS tablet Take 1 tablet (5mg ) twice daily (Patient taking differently: Take 5 mg by mouth 2 (two) times daily.) 60 tablet 0   aspirin 81 MG chewable tablet Chew 81 mg by mouth in the morning.     atenolol (TENORMIN) 50 MG tablet Take 50 mg by mouth 2 (two) times daily.     atorvastatin (LIPITOR) 80 MG tablet Take 1 tablet (80 mg total) by mouth daily. (Patient taking differently: Take 80 mg by mouth at bedtime.) 30 tablet 5   B Complex-Biotin-FA TABS Take 1 tablet by mouth in the morning.     barrier cream (NON-SPECIFIED) CREA Apply 1 Application topically See admin instructions. Apply to affected areas 2 times a day     chlorhexidine (PERIDEX) 0.12 % solution Use as directed 15 mLs in the mouth or throat See admin instructions. Rinse the mouth with 15 ml's once a day as directed, then spit out     Cholecalciferol (VITAMIN D3) 1.25 MG (50000 UT) CAPS Take 50,000 Units by mouth every Thursday.     clonazePAM (KLONOPIN) 0.5 MG tablet Take 1 tablet (0.5 mg total) by mouth 2 (two) times daily. 10 tablet 0   cyclobenzaprine (FLEXERIL) 10 MG tablet Take 1 tablet (10 mg total) by mouth 3 (three) times daily as needed for muscle spasms. 30 tablet 5   EPINEPHrine (EPIPEN 2-PAK) 0.3 mg/0.3 mL IJ SOAJ injection Inject 0.3 mg into the muscle daily as needed for anaphylaxis.     ezetimibe (ZETIA) 10 MG tablet Take 10 mg by mouth at bedtime.     FLUoxetine (PROZAC) 20 MG capsule Take 20 mg by mouth daily.     gabapentin (NEURONTIN) 400 MG capsule Take 1 capsule (400 mg total) by mouth 3 (three) times daily. 30 capsule 0   hydrALAZINE (APRESOLINE) 10 MG tablet Take 10 mg by mouth every 12 (twelve) hours as needed (for a Systolic reading >540 or Diastolic reading >98).     INCRUSE ELLIPTA  62.5 MCG/ACT AEPB Inhale 1 puff into the lungs daily.     insulin glargine (LANTUS) 100 UNIT/ML injection Inject 0.12 mLs (12 Units total) into the skin at bedtime.     insulin lispro (HUMALOG) 100 UNIT/ML injection Inject 0-14 Units into the skin See admin instructions. Inject 0-14 units twice daily as per sliding scale : CBG 0-200 : 0 units CBG 201-250 : 2 units CBG 251-300 : 4 units CBG 301-350 : 6 units CBG 351-400 : 8 units CBG 401-450 : 10 units CBG 451-500 : 12 units (if BP reads high, inject 14 units)     LACTOBACILLUS PO Take 1 capsule by mouth daily.     levothyroxine (SYNTHROID) 88 MCG tablet Take 88 mcg by mouth in the morning.     losartan (COZAAR) 25 MG tablet Take 37.5 mg by mouth daily.     MACROBID 100 MG capsule Take 100 mg by mouth in the morning.  magnesium oxide (MAG-OX) 400 (240 Mg) MG tablet Take 400 mg by mouth every other day.     Melatonin 5 MG TBDP Take 5 mg by mouth at bedtime.     Omega-3 Fatty Acids (FISH OIL) 1000 MG CAPS Take 1,000 mg by mouth 3 (three) times daily.     omeprazole (PRILOSEC) 20 MG capsule Take 20 mg by mouth in the morning.     ondansetron (ZOFRAN) 4 MG tablet Take 4 mg by mouth 2 (two) times daily.     OXYGEN Inhale 2 L/min into the lungs as needed (for shortness of breath).     polyethylene glycol powder (GLYCOLAX/MIRALAX) 17 GM/SCOOP powder Take 17 g by mouth daily.     Propyl Glycol-Hydroxyethylcell (NASAL MOIST) GEL Place 1 application  into both nostrils at bedtime.     QUEtiapine (SEROQUEL) 25 MG tablet Take 1 tablet (25 mg total) by mouth at bedtime. 10 tablet 0   rOPINIRole (REQUIP) 0.25 MG tablet Take 1 tablet (0.25 mg total) by mouth at bedtime. 30 tablet 0   senna-docusate (SENOKOT-S) 8.6-50 MG tablet Take 1 tablet by mouth 2 (two) times daily.     SYMBICORT 80-4.5 MCG/ACT inhaler Inhale 2 puffs into the lungs 2 (two) times daily.     TRULICITY 1.5 MG/0.5ML SOAJ Inject 1.5 mg into the skin every Friday.     VOLTAREN 1 % GEL  Apply 2 g topically See admin instructions. Apply 2 grams to the posterior neck  2 times a day     witch hazel-glycerin (TUCKS) pad Apply 1 Application topically every 4 (four) hours as needed for hemorrhoids.     No facility-administered medications prior to visit.     Allergies  Allergen Reactions   Cipro [Ciprofloxacin Hcl] Hives   Guaifenesin Anaphylaxis   Kiwi Extract Anaphylaxis and Rash   Levaquin [Levofloxacin] Shortness Of Breath and Rash   Strawberry Extract Anaphylaxis and Rash   Lioresal [Baclofen] Other (See Comments)    Near syncope/ fall    Social History   Tobacco Use   Smoking status: Former    Current packs/day: 0.00    Average packs/day: 0.5 packs/day for 36.1 years (18.1 ttl pk-yrs)    Types: Cigarettes    Start date: 10/08/1983    Quit date: 11/22/2019    Years since quitting: 3.9   Smokeless tobacco: Never  Vaping Use   Vaping status: Never Used  Substance Use Topics   Alcohol use: No   Drug use: No    Family History  Problem Relation Age of Onset   Diabetes Mother    COPD Sister    Heart failure Sister    Breast cancer Sister     Objective:  There were no vitals filed for this visit. There is no height or weight on file to calculate BMI.  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 - CT renal study showed no acute  abnormalities.  CT with contrast showed renal mass.  MRI was motion degraded/recommended 4 to 6-week imaging with CT or MRI.  TTE and TEE without vegetation.  Optho eval for line. - Patient discharged on micafungin to complete 2 weeks from negative blood cultures EOT 11/01/2022  *** will return to clinic in *** {weeks/months} for follow up  Danelle Earthly, MD [facility]   11/03/23  8:52 AM

## 2023-11-10 ENCOUNTER — Encounter (HOSPITAL_COMMUNITY): Payer: Self-pay | Admitting: Emergency Medicine

## 2023-11-10 ENCOUNTER — Other Ambulatory Visit: Payer: Self-pay

## 2023-11-10 ENCOUNTER — Emergency Department (HOSPITAL_COMMUNITY): Payer: Medicaid Other

## 2023-11-10 ENCOUNTER — Inpatient Hospital Stay (HOSPITAL_COMMUNITY)
Admission: EM | Admit: 2023-11-10 | Discharge: 2023-11-14 | DRG: 683 | Disposition: A | Discharge status: Skilled Nursing Facility | Payer: Medicaid Other | Source: Skilled Nursing Facility | Attending: Internal Medicine | Admitting: Internal Medicine

## 2023-11-10 DIAGNOSIS — Z881 Allergy status to other antibiotic agents status: Secondary | ICD-10-CM

## 2023-11-10 DIAGNOSIS — Z86711 Personal history of pulmonary embolism: Secondary | ICD-10-CM

## 2023-11-10 DIAGNOSIS — Z1152 Encounter for screening for COVID-19: Secondary | ICD-10-CM

## 2023-11-10 DIAGNOSIS — C641 Malignant neoplasm of right kidney, except renal pelvis: Secondary | ICD-10-CM | POA: Diagnosis present

## 2023-11-10 DIAGNOSIS — Z888 Allergy status to other drugs, medicaments and biological substances status: Secondary | ICD-10-CM

## 2023-11-10 DIAGNOSIS — J4489 Other specified chronic obstructive pulmonary disease: Secondary | ICD-10-CM | POA: Diagnosis present

## 2023-11-10 DIAGNOSIS — Z825 Family history of asthma and other chronic lower respiratory diseases: Secondary | ICD-10-CM

## 2023-11-10 DIAGNOSIS — Z8673 Personal history of transient ischemic attack (TIA), and cerebral infarction without residual deficits: Secondary | ICD-10-CM

## 2023-11-10 DIAGNOSIS — E1165 Type 2 diabetes mellitus with hyperglycemia: Secondary | ICD-10-CM | POA: Diagnosis present

## 2023-11-10 DIAGNOSIS — Z7951 Long term (current) use of inhaled steroids: Secondary | ICD-10-CM

## 2023-11-10 DIAGNOSIS — Z87440 Personal history of urinary (tract) infections: Secondary | ICD-10-CM

## 2023-11-10 DIAGNOSIS — N179 Acute kidney failure, unspecified: Principal | ICD-10-CM | POA: Diagnosis present

## 2023-11-10 DIAGNOSIS — Z79899 Other long term (current) drug therapy: Secondary | ICD-10-CM

## 2023-11-10 DIAGNOSIS — I10 Essential (primary) hypertension: Secondary | ICD-10-CM | POA: Diagnosis present

## 2023-11-10 DIAGNOSIS — Z7901 Long term (current) use of anticoagulants: Secondary | ICD-10-CM

## 2023-11-10 DIAGNOSIS — Z7985 Long-term (current) use of injectable non-insulin antidiabetic drugs: Secondary | ICD-10-CM

## 2023-11-10 DIAGNOSIS — N1831 Chronic kidney disease, stage 3a: Secondary | ICD-10-CM | POA: Diagnosis present

## 2023-11-10 DIAGNOSIS — Z8249 Family history of ischemic heart disease and other diseases of the circulatory system: Secondary | ICD-10-CM

## 2023-11-10 DIAGNOSIS — A419 Sepsis, unspecified organism: Principal | ICD-10-CM | POA: Diagnosis present

## 2023-11-10 DIAGNOSIS — Z720 Tobacco use: Secondary | ICD-10-CM | POA: Diagnosis present

## 2023-11-10 DIAGNOSIS — N390 Urinary tract infection, site not specified: Secondary | ICD-10-CM | POA: Diagnosis present

## 2023-11-10 DIAGNOSIS — Z833 Family history of diabetes mellitus: Secondary | ICD-10-CM

## 2023-11-10 DIAGNOSIS — I5189 Other ill-defined heart diseases: Secondary | ICD-10-CM

## 2023-11-10 DIAGNOSIS — I69354 Hemiplegia and hemiparesis following cerebral infarction affecting left non-dominant side: Secondary | ICD-10-CM

## 2023-11-10 DIAGNOSIS — E1140 Type 2 diabetes mellitus with diabetic neuropathy, unspecified: Secondary | ICD-10-CM | POA: Diagnosis present

## 2023-11-10 DIAGNOSIS — G2581 Restless legs syndrome: Secondary | ICD-10-CM | POA: Diagnosis present

## 2023-11-10 DIAGNOSIS — Z91018 Allergy to other foods: Secondary | ICD-10-CM

## 2023-11-10 DIAGNOSIS — E1122 Type 2 diabetes mellitus with diabetic chronic kidney disease: Secondary | ICD-10-CM | POA: Diagnosis present

## 2023-11-10 DIAGNOSIS — I129 Hypertensive chronic kidney disease with stage 1 through stage 4 chronic kidney disease, or unspecified chronic kidney disease: Secondary | ICD-10-CM | POA: Diagnosis present

## 2023-11-10 DIAGNOSIS — K219 Gastro-esophageal reflux disease without esophagitis: Secondary | ICD-10-CM | POA: Diagnosis present

## 2023-11-10 DIAGNOSIS — Z794 Long term (current) use of insulin: Secondary | ICD-10-CM

## 2023-11-10 DIAGNOSIS — Z6831 Body mass index (BMI) 31.0-31.9, adult: Secondary | ICD-10-CM

## 2023-11-10 DIAGNOSIS — E785 Hyperlipidemia, unspecified: Secondary | ICD-10-CM | POA: Diagnosis present

## 2023-11-10 DIAGNOSIS — Z7982 Long term (current) use of aspirin: Secondary | ICD-10-CM

## 2023-11-10 DIAGNOSIS — E1169 Type 2 diabetes mellitus with other specified complication: Secondary | ICD-10-CM | POA: Diagnosis present

## 2023-11-10 DIAGNOSIS — E119 Type 2 diabetes mellitus without complications: Secondary | ICD-10-CM

## 2023-11-10 DIAGNOSIS — E039 Hypothyroidism, unspecified: Secondary | ICD-10-CM | POA: Diagnosis present

## 2023-11-10 DIAGNOSIS — F32A Depression, unspecified: Secondary | ICD-10-CM | POA: Diagnosis present

## 2023-11-10 DIAGNOSIS — Z86718 Personal history of other venous thrombosis and embolism: Secondary | ICD-10-CM

## 2023-11-10 DIAGNOSIS — Z803 Family history of malignant neoplasm of breast: Secondary | ICD-10-CM

## 2023-11-10 DIAGNOSIS — Z79890 Hormone replacement therapy (postmenopausal): Secondary | ICD-10-CM

## 2023-11-10 DIAGNOSIS — E669 Obesity, unspecified: Secondary | ICD-10-CM | POA: Diagnosis present

## 2023-11-10 LAB — CBC WITH DIFFERENTIAL/PLATELET
Abs Immature Granulocytes: 0.05 10*3/uL (ref 0.00–0.07)
Basophils Absolute: 0 10*3/uL (ref 0.0–0.1)
Basophils Relative: 0 %
Eosinophils Absolute: 0 10*3/uL (ref 0.0–0.5)
Eosinophils Relative: 0 %
HCT: 43.7 % (ref 36.0–46.0)
Hemoglobin: 13.6 g/dL (ref 12.0–15.0)
Immature Granulocytes: 0 %
Lymphocytes Relative: 10 %
Lymphs Abs: 1.5 10*3/uL (ref 0.7–4.0)
MCH: 26.7 pg (ref 26.0–34.0)
MCHC: 31.1 g/dL (ref 30.0–36.0)
MCV: 85.9 fL (ref 80.0–100.0)
Monocytes Absolute: 1 10*3/uL (ref 0.1–1.0)
Monocytes Relative: 7 %
Neutro Abs: 12.4 10*3/uL — ABNORMAL HIGH (ref 1.7–7.7)
Neutrophils Relative %: 83 %
Platelets: 186 10*3/uL (ref 150–400)
RBC: 5.09 MIL/uL (ref 3.87–5.11)
RDW: 13.7 % (ref 11.5–15.5)
WBC: 15.1 10*3/uL — ABNORMAL HIGH (ref 4.0–10.5)
nRBC: 0 % (ref 0.0–0.2)

## 2023-11-10 LAB — COMPREHENSIVE METABOLIC PANEL
ALT: 14 U/L (ref 0–44)
AST: 23 U/L (ref 15–41)
Albumin: 3.5 g/dL (ref 3.5–5.0)
Alkaline Phosphatase: 64 U/L (ref 38–126)
Anion gap: 15 (ref 5–15)
BUN: 21 mg/dL — ABNORMAL HIGH (ref 6–20)
CO2: 24 mmol/L (ref 22–32)
Calcium: 9 mg/dL (ref 8.9–10.3)
Chloride: 96 mmol/L — ABNORMAL LOW (ref 98–111)
Creatinine, Ser: 1.48 mg/dL — ABNORMAL HIGH (ref 0.44–1.00)
GFR, Estimated: 43 mL/min — ABNORMAL LOW (ref >60)
Glucose, Bld: 273 mg/dL — ABNORMAL HIGH (ref 70–99)
Potassium: 4.1 mmol/L (ref 3.5–5.1)
Sodium: 135 mmol/L (ref 135–145)
Total Bilirubin: 1 mg/dL (ref 0.0–1.2)
Total Protein: 7.7 g/dL (ref 6.5–8.1)

## 2023-11-10 LAB — PROTIME-INR
INR: 1.7 — ABNORMAL HIGH (ref 0.8–1.2)
Prothrombin Time: 20.1 s — ABNORMAL HIGH (ref 11.4–15.2)

## 2023-11-10 LAB — I-STAT CG4 LACTIC ACID, ED: Lactic Acid, Venous: 2.3 mmol/L (ref 0.5–1.9)

## 2023-11-10 LAB — RESP PANEL BY RT-PCR (RSV, FLU A&B, COVID)  RVPGX2
Influenza A by PCR: NEGATIVE
Influenza B by PCR: NEGATIVE
Resp Syncytial Virus by PCR: NEGATIVE
SARS Coronavirus 2 by RT PCR: NEGATIVE

## 2023-11-10 MED ORDER — ACETAMINOPHEN 325 MG PO TABS
650.0000 mg | ORAL_TABLET | Freq: Once | ORAL | Status: AC
  Administered 2023-11-10: 650 mg via ORAL

## 2023-11-10 MED ORDER — LACTATED RINGERS IV BOLUS (SEPSIS): 1000.0000 mL | Freq: Once | INTRAVENOUS | Status: DC

## 2023-11-10 MED ORDER — ACETAMINOPHEN 650 MG RE SUPP: 650.0000 mg | Freq: Once | RECTAL | Status: DC

## 2023-11-10 MED ORDER — LACTATED RINGERS IV BOLUS (SEPSIS)
1000.0000 mL | Freq: Once | INTRAVENOUS | Status: AC
  Administered 2023-11-10: 1000 mL via INTRAVENOUS

## 2023-11-10 MED ORDER — ACETAMINOPHEN 325 MG PO TABS
ORAL_TABLET | ORAL | Status: AC
  Filled 2023-11-10: qty 2

## 2023-11-10 MED ORDER — VANCOMYCIN HCL IN DEXTROSE 1-5 GM/200ML-% IV SOLN
1000.0000 mg | Freq: Once | INTRAVENOUS | Status: AC
  Administered 2023-11-11: 1000 mg via INTRAVENOUS
  Filled 2023-11-10: qty 200

## 2023-11-10 MED ORDER — SODIUM CHLORIDE 0.9 % IV SOLN
2.0000 g | Freq: Once | INTRAVENOUS | Status: AC
  Administered 2023-11-11: 2 g via INTRAVENOUS
  Filled 2023-11-10: qty 12.5

## 2023-11-10 MED ORDER — LACTATED RINGERS IV BOLUS (SEPSIS)
1000.0000 mL | Freq: Once | INTRAVENOUS | Status: AC
  Administered 2023-11-11: 1000 mL via INTRAVENOUS

## 2023-11-10 MED ORDER — METRONIDAZOLE 500 MG/100ML IV SOLN
500.0000 mg | Freq: Once | INTRAVENOUS | Status: AC
  Administered 2023-11-11: 500 mg via INTRAVENOUS
  Filled 2023-11-10: qty 100

## 2023-11-10 NOTE — ED Provider Notes (Signed)
[place] EMERGENCY DEPARTMENT AT [facility] Provider Note   CSN: [id number] Arrival date & time: 11/10/23  2238     History {Add pertinent medical, surgical, social history, OB history to HPI:1} Chief Complaint  Patient presents with   Tachycardia    SEMONE ORLOV is a 53 y.o. female.  The history is provided by the patient.  She has history of hypertension, diabetes, hyperlipidemia, chronic kidney disease, stroke with residual left-sided weakness, COPD, pulmonary embolism anticoagulated on apixaban and comes in from a skilled nursing facility where she developed chills tonight.  She endorses mild sore throat and some nausea.  She denies cough, vomiting, diarrhea, dysuria.  She was brought in by ambulance where she was noted to have fever, tachycardia, hypoxia (oxygen saturation 88% on room air improved to 98% on 2 L/min.   Home Medications Prior to Admission medications   Medication Sig Start Date End Date Taking? Authorizing Provider  acetaminophen (TYLENOL) 500 MG tablet Take 1,000 mg by mouth 3 (three) times daily.    [provider]  albuterol (VENTOLIN HFA) 108 (90 Base) MCG/ACT inhaler Inhale 2 puffs into the lungs every 6 (six) hours as needed for wheezing or shortness of breath. 10/04/19   Grayce Sessions, NP  apixaban (ELIQUIS) 5 MG TABS tablet Take 1 tablet (5mg ) twice daily Patient taking differently: Take 5 mg by mouth 2 (two) times daily. 09/10/22   Jerald Kief, MD  aspirin 81 MG chewable tablet Chew 81 mg by mouth in the morning.    [provider]  atenolol (TENORMIN) 50 MG tablet Take 50 mg by mouth 2 (two) times daily.    [provider]  atorvastatin (LIPITOR) 80 MG tablet Take 1 tablet (80 mg total) by mouth daily. Patient taking differently: Take 80 mg by mouth at bedtime. 10/04/19   Grayce Sessions, NP  B Complex-Biotin-FA TABS Take 1 tablet by mouth in the morning.    [provider]  barrier cream  (NON-SPECIFIED) CREA Apply 1 Application topically See admin instructions. Apply to affected areas 2 times a day    [provider]  chlorhexidine (PERIDEX) 0.12 % solution Use as directed 15 mLs in the mouth or throat See admin instructions. Rinse the mouth with 15 ml's once a day as directed, then spit out    [provider]  Cholecalciferol (VITAMIN D3) 1.25 MG (50000 UT) CAPS Take 50,000 Units by mouth every Thursday.    [provider]  clonazePAM (KLONOPIN) 0.5 MG tablet Take 1 tablet (0.5 mg total) by mouth 2 (two) times daily. 10/24/23   Osvaldo Shipper, MD  cyclobenzaprine (FLEXERIL) 10 MG tablet Take 1 tablet (10 mg total) by mouth 3 (three) times daily as needed for muscle spasms. 10/04/19   Grayce Sessions, NP  EPINEPHrine (EPIPEN 2-PAK) 0.3 mg/0.3 mL IJ SOAJ injection Inject 0.3 mg into the muscle daily as needed for anaphylaxis.    [provider]  ezetimibe (ZETIA) 10 MG tablet Take 10 mg by mouth at bedtime.    [provider]  FLUoxetine (PROZAC) 20 MG capsule Take 20 mg by mouth daily.    [provider]  gabapentin (NEURONTIN) 400 MG capsule Take 1 capsule (400 mg total) by mouth 3 (three) times daily. 10/24/23   Osvaldo Shipper, MD  hydrALAZINE (APRESOLINE) 10 MG tablet Take 10 mg by mouth every 12 (twelve) hours as needed (for a Systolic reading >829 or Diastolic reading >56).    [provider]  INCRUSE ELLIPTA 62.5 MCG/ACT AEPB Inhale 1 puff into the lungs daily.    [provider]  insulin glargine (LANTUS) 100 UNIT/ML injection Inject 0.12 mLs (12 Units total) into the skin at bedtime. 10/24/23   Osvaldo Shipper, MD  insulin lispro (HUMALOG) 100 UNIT/ML injection Inject 0-14 Units into the skin See admin instructions. Inject 0-14 units twice daily as per sliding scale : CBG 0-200 : 0 units CBG 201-250 : 2 units CBG 251-300 : 4 units CBG 301-350 : 6 units CBG 351-400 : 8 units CBG 401-450 : 10  units CBG 451-500 : 12 units (if BP reads high, inject 14 units)    [provider]  LACTOBACILLUS PO Take 1 capsule by mouth daily.    [provider]  levothyroxine (SYNTHROID) 88 MCG tablet Take 88 mcg by mouth in the morning.    [provider]  losartan (COZAAR) 25 MG tablet Take 37.5 mg by mouth daily.    [provider]  MACROBID 100 MG capsule Take 100 mg by mouth in the morning.    [provider]  magnesium oxide (MAG-OX) 400 (240 Mg) MG tablet Take 400 mg by mouth every other day.    [provider]  Melatonin 5 MG TBDP Take 5 mg by mouth at bedtime.    [provider]  Omega-3 Fatty Acids (FISH OIL) 1000 MG CAPS Take 1,000 mg by mouth 3 (three) times daily.    [provider]  omeprazole (PRILOSEC) 20 MG capsule Take 20 mg by mouth in the morning.    [provider]  ondansetron (ZOFRAN) 4 MG tablet Take 4 mg by mouth 2 (two) times daily.    [provider]  OXYGEN Inhale 2 L/min into the lungs as needed (for shortness of breath).    [provider]  polyethylene glycol powder (GLYCOLAX/MIRALAX) 17 GM/SCOOP powder Take 17 g by mouth daily.    [provider]  Propyl Glycol-Hydroxyethylcell (NASAL MOIST) GEL Place 1 application  into both nostrils at bedtime.    [provider]  QUEtiapine (SEROQUEL) 25 MG tablet Take 1 tablet (25 mg total) by mouth at bedtime. 10/24/23   Osvaldo Shipper, MD  rOPINIRole (REQUIP) 0.25 MG tablet Take 1 tablet (0.25 mg total) by mouth at bedtime. 10/24/23   Osvaldo Shipper, MD  senna-docusate (SENOKOT-S) 8.6-50 MG tablet Take 1 tablet by mouth 2 (two) times daily. 10/24/23   Osvaldo Shipper, MD  SYMBICORT 80-4.5 MCG/ACT inhaler Inhale 2 puffs into the lungs 2 (two) times daily.    [provider]  TRULICITY 1.5 MG/0.5ML SOAJ Inject 1.5 mg into the skin every Friday.    [provider]  VOLTAREN 1 % GEL Apply 2 g topically See  admin instructions. Apply 2 grams to the posterior neck  2 times a day    [provider]  witch hazel-glycerin (TUCKS) pad Apply 1 Application topically every 4 (four) hours as needed for hemorrhoids.    [provider]      Allergies    Cipro [ciprofloxacin hcl], Guaifenesin, Kiwi extract, Levaquin [levofloxacin], Strawberry extract, and Lioresal [baclofen]    Review of Systems   Review of Systems  All other systems reviewed and are negative.   Physical Exam Updated Vital Signs BP (!) 146/88 (BP Location: Right Arm)   Pulse (!) 114   Temp (!) 102.5 F (39.2 C) (Oral)   Resp 18   Ht 5' 7.5" (1.715 m)  Wt 93 kg   LMP 10/20/2019   SpO2 93%   BMI 31.63 kg/m  Physical Exam Vitals and nursing note reviewed.   53 year old female, resting comfortably and in no acute distress. Vital signs are significant for elevated temperature, heart rate, systolic blood pressure. Oxygen saturation is 93%, which is normal. Head is normocephalic and atraumatic. PERRLA, EOMI. Oropharynx is clear. Neck is nontender and supple without adenopathy. Lungs are clear without rales, wheezes, or rhonchi. Chest is nontender. Heart has regular rate and rhythm without murmur. Abdomen is soft, flat, nontender. Extremities have no cyanosis or edema, full range of motion is present. Skin is warm and dry without rash. Neurologic: Mental status is normal, cranial nerves are intact, left hemiparesis present.  ED Results / Procedures / Treatments   Labs (all labs ordered are listed, but only abnormal results are displayed) Labs Reviewed  CULTURE, BLOOD (ROUTINE X 2)  CULTURE, BLOOD (ROUTINE X 2)  RESP PANEL BY RT-PCR (RSV, FLU A&B, COVID)  RVPGX2  COMPREHENSIVE METABOLIC PANEL  CBC WITH DIFFERENTIAL/PLATELET  PROTIME-INR  URINALYSIS, W/ REFLEX TO CULTURE (INFECTION SUSPECTED)  I-STAT CG4 LACTIC ACID, ED    EKG None  Radiology No results found.  Procedures Procedures  Cardiac  monitor shows sinus tachycardia, per my interpretation.  Medications Ordered in ED Medications  acetaminophen (TYLENOL) suppository 650 mg (has no administration in time range)  lactated ringers bolus 1,000 mL (has no administration in time range)    ED Course/ Medical Decision Making/ A&P   {   Click here for ABCD2, HEART and other calculatorsREFRESH Note before signing :1}                              Medical Decision Making  Fever and chills concerning for sepsis, no obvious site on initial exam.  Consider influenza or other viral illness.  I have initiated the code sepsis pathway, ordered acetaminophen for fever.  In addition to standard sepsis labs, I have also ordered respiratory pathogen PCR panel.  I have reviewed her past records, and she has several hospital admissions for sepsis, most recently 09/06/2023-09/10/2023 at which time she had a urinary tract infection.  {Document critical care time when appropriate:1} {Document review of labs and clinical decision tools ie heart score, Chads2Vasc2 etc:1}  {Document your independent review of radiology images, and any outside records:1} {Document your discussion with family members, caretakers, and with consultants:1} {Document social determinants of health affecting pt's care:1} {Document your decision making why or why not admission, treatments were needed:1} Final Clinical Impression(s) / ED Diagnoses Final diagnoses:  None    Rx / DC Orders ED Discharge Orders     None

## 2023-11-10 NOTE — ED Triage Notes (Addendum)
Pt BIB [facility] from [place] with tachycardia, possible fever x 1-2 hours ago; concerned for sepsis, v/ 126 palp, RR 18, HR 119, 98% 2L% 88% RA, CBG 327, hx of stroke with left sided deficits

## 2023-11-11 ENCOUNTER — Encounter (HOSPITAL_COMMUNITY): Payer: Self-pay | Admitting: Internal Medicine

## 2023-11-11 DIAGNOSIS — Z794 Long term (current) use of insulin: Secondary | ICD-10-CM

## 2023-11-11 DIAGNOSIS — I69354 Hemiplegia and hemiparesis following cerebral infarction affecting left non-dominant side: Secondary | ICD-10-CM | POA: Diagnosis not present

## 2023-11-11 DIAGNOSIS — N390 Urinary tract infection, site not specified: Secondary | ICD-10-CM | POA: Diagnosis present

## 2023-11-11 DIAGNOSIS — N1832 Chronic kidney disease, stage 3b: Secondary | ICD-10-CM | POA: Diagnosis not present

## 2023-11-11 DIAGNOSIS — I10 Essential (primary) hypertension: Secondary | ICD-10-CM

## 2023-11-11 DIAGNOSIS — E785 Hyperlipidemia, unspecified: Secondary | ICD-10-CM | POA: Diagnosis present

## 2023-11-11 DIAGNOSIS — N1831 Chronic kidney disease, stage 3a: Secondary | ICD-10-CM | POA: Diagnosis present

## 2023-11-11 DIAGNOSIS — E1165 Type 2 diabetes mellitus with hyperglycemia: Secondary | ICD-10-CM | POA: Diagnosis present

## 2023-11-11 DIAGNOSIS — Z7901 Long term (current) use of anticoagulants: Secondary | ICD-10-CM | POA: Diagnosis not present

## 2023-11-11 DIAGNOSIS — I129 Hypertensive chronic kidney disease with stage 1 through stage 4 chronic kidney disease, or unspecified chronic kidney disease: Secondary | ICD-10-CM | POA: Diagnosis present

## 2023-11-11 DIAGNOSIS — Z8249 Family history of ischemic heart disease and other diseases of the circulatory system: Secondary | ICD-10-CM | POA: Diagnosis not present

## 2023-11-11 DIAGNOSIS — E1140 Type 2 diabetes mellitus with diabetic neuropathy, unspecified: Secondary | ICD-10-CM | POA: Diagnosis present

## 2023-11-11 DIAGNOSIS — E1122 Type 2 diabetes mellitus with diabetic chronic kidney disease: Secondary | ICD-10-CM

## 2023-11-11 DIAGNOSIS — J4489 Other specified chronic obstructive pulmonary disease: Secondary | ICD-10-CM | POA: Diagnosis present

## 2023-11-11 DIAGNOSIS — Z86718 Personal history of other venous thrombosis and embolism: Secondary | ICD-10-CM | POA: Diagnosis not present

## 2023-11-11 DIAGNOSIS — Z7951 Long term (current) use of inhaled steroids: Secondary | ICD-10-CM | POA: Diagnosis not present

## 2023-11-11 DIAGNOSIS — A419 Sepsis, unspecified organism: Secondary | ICD-10-CM

## 2023-11-11 DIAGNOSIS — Z1152 Encounter for screening for COVID-19: Secondary | ICD-10-CM | POA: Diagnosis not present

## 2023-11-11 DIAGNOSIS — C641 Malignant neoplasm of right kidney, except renal pelvis: Secondary | ICD-10-CM | POA: Diagnosis present

## 2023-11-11 DIAGNOSIS — E039 Hypothyroidism, unspecified: Secondary | ICD-10-CM | POA: Diagnosis present

## 2023-11-11 DIAGNOSIS — N179 Acute kidney failure, unspecified: Secondary | ICD-10-CM | POA: Diagnosis present

## 2023-11-11 DIAGNOSIS — Z79890 Hormone replacement therapy (postmenopausal): Secondary | ICD-10-CM | POA: Diagnosis not present

## 2023-11-11 DIAGNOSIS — F32A Depression, unspecified: Secondary | ICD-10-CM | POA: Diagnosis present

## 2023-11-11 DIAGNOSIS — E1169 Type 2 diabetes mellitus with other specified complication: Secondary | ICD-10-CM | POA: Diagnosis present

## 2023-11-11 DIAGNOSIS — E669 Obesity, unspecified: Secondary | ICD-10-CM | POA: Diagnosis present

## 2023-11-11 DIAGNOSIS — G2581 Restless legs syndrome: Secondary | ICD-10-CM | POA: Diagnosis present

## 2023-11-11 DIAGNOSIS — Z7982 Long term (current) use of aspirin: Secondary | ICD-10-CM | POA: Diagnosis not present

## 2023-11-11 LAB — URINALYSIS, W/ REFLEX TO CULTURE (INFECTION SUSPECTED)
Bilirubin Urine: NEGATIVE
Glucose, UA: NEGATIVE mg/dL
Ketones, ur: NEGATIVE mg/dL
Nitrite: NEGATIVE
Protein, ur: 100 mg/dL — AB
Specific Gravity, Urine: 1.008 (ref 1.005–1.030)
pH: 6 (ref 5.0–8.0)

## 2023-11-11 LAB — GLUCOSE, CAPILLARY: Glucose-Capillary: 129 mg/dL — ABNORMAL HIGH (ref 70–99)

## 2023-11-11 LAB — CBC WITH DIFFERENTIAL/PLATELET
Abs Immature Granulocytes: 0.09 10*3/uL — ABNORMAL HIGH (ref 0.00–0.07)
Basophils Absolute: 0 10*3/uL (ref 0.0–0.1)
Basophils Relative: 0 %
Eosinophils Absolute: 0 10*3/uL (ref 0.0–0.5)
Eosinophils Relative: 0 %
HCT: 42.6 % (ref 36.0–46.0)
Hemoglobin: 13 g/dL (ref 12.0–15.0)
Immature Granulocytes: 1 %
Lymphocytes Relative: 16 %
Lymphs Abs: 2.3 10*3/uL (ref 0.7–4.0)
MCH: 26.6 pg (ref 26.0–34.0)
MCHC: 30.5 g/dL (ref 30.0–36.0)
MCV: 87.3 fL (ref 80.0–100.0)
Monocytes Absolute: 1.1 10*3/uL — ABNORMAL HIGH (ref 0.1–1.0)
Monocytes Relative: 8 %
Neutro Abs: 10.9 10*3/uL — ABNORMAL HIGH (ref 1.7–7.7)
Neutrophils Relative %: 75 %
Platelets: 167 10*3/uL (ref 150–400)
RBC: 4.88 MIL/uL (ref 3.87–5.11)
RDW: 13.7 % (ref 11.5–15.5)
WBC: 14.5 10*3/uL — ABNORMAL HIGH (ref 4.0–10.5)
nRBC: 0 % (ref 0.0–0.2)

## 2023-11-11 LAB — BASIC METABOLIC PANEL
Anion gap: 11 (ref 5–15)
BUN: 19 mg/dL (ref 6–20)
CO2: 24 mmol/L (ref 22–32)
Calcium: 9 mg/dL (ref 8.9–10.3)
Chloride: 99 mmol/L (ref 98–111)
Creatinine, Ser: 1.37 mg/dL — ABNORMAL HIGH (ref 0.44–1.00)
GFR, Estimated: 47 mL/min — ABNORMAL LOW (ref >60)
Glucose, Bld: 189 mg/dL — ABNORMAL HIGH (ref 70–99)
Potassium: 4.8 mmol/L (ref 3.5–5.1)
Sodium: 134 mmol/L — ABNORMAL LOW (ref 135–145)

## 2023-11-11 LAB — HEPATIC FUNCTION PANEL
ALT: 11 U/L (ref 0–44)
AST: 26 U/L (ref 15–41)
Albumin: 3.2 g/dL — ABNORMAL LOW (ref 3.5–5.0)
Alkaline Phosphatase: 64 U/L (ref 38–126)
Bilirubin, Direct: 0.3 mg/dL — ABNORMAL HIGH (ref 0.0–0.2)
Indirect Bilirubin: 0.9 mg/dL (ref 0.3–0.9)
Total Bilirubin: 1.2 mg/dL (ref 0.0–1.2)
Total Protein: 7.5 g/dL (ref 6.5–8.1)

## 2023-11-11 LAB — LACTIC ACID, PLASMA
Lactic Acid, Venous: 2.7 mmol/L (ref 0.5–1.9)
Lactic Acid, Venous: 1.1 mmol/L (ref 0.5–1.9)

## 2023-11-11 LAB — CBG MONITORING, ED
Glucose-Capillary: 213 mg/dL — ABNORMAL HIGH (ref 70–99)
Glucose-Capillary: 136 mg/dL — ABNORMAL HIGH (ref 70–99)
Glucose-Capillary: 160 mg/dL — ABNORMAL HIGH (ref 70–99)

## 2023-11-11 MED ORDER — EZETIMIBE 10 MG PO TABS
10.0000 mg | ORAL_TABLET | Freq: Every day | ORAL | Status: DC
  Administered 2023-11-11 – 2023-11-13 (×3): 10 mg via ORAL
  Filled 2023-11-11 (×4): qty 1

## 2023-11-11 MED ORDER — ACETAMINOPHEN 325 MG PO TABS
650.0000 mg | ORAL_TABLET | Freq: Four times a day (QID) | ORAL | Status: DC | PRN
  Administered 2023-11-11 (×3): 650 mg via ORAL
  Filled 2023-11-11 (×3): qty 2

## 2023-11-11 MED ORDER — ATORVASTATIN CALCIUM 40 MG PO TABS
80.0000 mg | ORAL_TABLET | Freq: Every day | ORAL | Status: DC
  Administered 2023-11-11 – 2023-11-13 (×3): 80 mg via ORAL
  Filled 2023-11-11 (×3): qty 2

## 2023-11-11 MED ORDER — GABAPENTIN 400 MG PO CAPS
400.0000 mg | ORAL_CAPSULE | Freq: Three times a day (TID) | ORAL | Status: DC
  Administered 2023-11-11 – 2023-11-14 (×10): 400 mg via ORAL
  Filled 2023-11-11 (×10): qty 1

## 2023-11-11 MED ORDER — POLYETHYLENE GLYCOL 3350 17 GM/SCOOP PO POWD
17.0000 g | Freq: Every day | ORAL | Status: DC
  Filled 2023-11-11: qty 255

## 2023-11-11 MED ORDER — VITAMIN D3 1.25 MG (50000 UT) PO CAPS
50000.0000 [IU] | ORAL_CAPSULE | ORAL | Status: DC
  Filled 2023-11-11: qty 1

## 2023-11-11 MED ORDER — PANTOPRAZOLE SODIUM 40 MG PO TBEC
40.0000 mg | DELAYED_RELEASE_TABLET | Freq: Every day | ORAL | Status: DC
  Administered 2023-11-11 – 2023-11-14 (×4): 40 mg via ORAL
  Filled 2023-11-11 (×4): qty 1

## 2023-11-11 MED ORDER — UMECLIDINIUM BROMIDE 62.5 MCG/ACT IN AEPB
1.0000 | INHALATION_SPRAY | Freq: Every day | RESPIRATORY_TRACT | Status: DC
  Filled 2023-11-11: qty 7

## 2023-11-11 MED ORDER — LACTATED RINGERS IV SOLN: INTRAVENOUS | Status: AC

## 2023-11-11 MED ORDER — CYCLOBENZAPRINE HCL 10 MG PO TABS
10.0000 mg | ORAL_TABLET | Freq: Three times a day (TID) | ORAL | Status: DC | PRN
  Administered 2023-11-11: 10 mg via ORAL
  Filled 2023-11-11: qty 1

## 2023-11-11 MED ORDER — LOSARTAN POTASSIUM 25 MG PO TABS: 37.5000 mg | ORAL_TABLET | Freq: Every day | ORAL | Status: DC

## 2023-11-11 MED ORDER — QUETIAPINE FUMARATE 25 MG PO TABS
25.0000 mg | ORAL_TABLET | Freq: Every day | ORAL | Status: DC
  Administered 2023-11-11 – 2023-11-13 (×3): 25 mg via ORAL
  Filled 2023-11-11 (×3): qty 1

## 2023-11-11 MED ORDER — INSULIN ASPART 100 UNIT/ML IJ SOLN
0.0000 [IU] | Freq: Three times a day (TID) | INTRAMUSCULAR | Status: DC
Start: 2023-11-11 — End: 2023-11-14
  Administered 2023-11-11: 3 [IU] via SUBCUTANEOUS
  Administered 2023-11-11: 1 [IU] via SUBCUTANEOUS
  Administered 2023-11-11: 2 [IU] via SUBCUTANEOUS
  Administered 2023-11-12: 1 [IU] via SUBCUTANEOUS
  Administered 2023-11-12: 2 [IU] via SUBCUTANEOUS
  Administered 2023-11-12: 3 [IU] via SUBCUTANEOUS
  Administered 2023-11-13: 2 [IU] via SUBCUTANEOUS
  Administered 2023-11-13: 1 [IU] via SUBCUTANEOUS
  Administered 2023-11-13: 2 [IU] via SUBCUTANEOUS
  Administered 2023-11-14: 1 [IU] via SUBCUTANEOUS
  Administered 2023-11-14: 3 [IU] via SUBCUTANEOUS
  Filled 2023-11-11: qty 0.09

## 2023-11-11 MED ORDER — VANCOMYCIN HCL IN DEXTROSE 1-5 GM/200ML-% IV SOLN
1000.0000 mg | INTRAVENOUS | Status: DC
  Administered 2023-11-11: 1000 mg via INTRAVENOUS
  Filled 2023-11-11: qty 200

## 2023-11-11 MED ORDER — CLONAZEPAM 0.5 MG PO TABS
0.5000 mg | ORAL_TABLET | Freq: Two times a day (BID) | ORAL | Status: DC
  Administered 2023-11-11 – 2023-11-14 (×7): 0.5 mg via ORAL
  Filled 2023-11-11 (×7): qty 1

## 2023-11-11 MED ORDER — INSULIN GLARGINE-YFGN 100 UNIT/ML ~~LOC~~ SOLN
10.0000 [IU] | Freq: Every day | SUBCUTANEOUS | Status: DC
  Administered 2023-11-11 – 2023-11-13 (×3): 10 [IU] via SUBCUTANEOUS
  Filled 2023-11-11 (×4): qty 0.1

## 2023-11-11 MED ORDER — NASAL MOIST NA GEL: 1.0000 | Freq: Every day | NASAL | Status: DC

## 2023-11-11 MED ORDER — TRAMADOL HCL 50 MG PO TABS
50.0000 mg | ORAL_TABLET | Freq: Two times a day (BID) | ORAL | Status: DC | PRN
  Administered 2023-11-11 – 2023-11-14 (×6): 50 mg via ORAL
  Filled 2023-11-11 (×6): qty 1

## 2023-11-11 MED ORDER — MOMETASONE FURO-FORMOTEROL FUM 100-5 MCG/ACT IN AERO
2.0000 | INHALATION_SPRAY | Freq: Two times a day (BID) | RESPIRATORY_TRACT | Status: DC
  Administered 2023-11-14: 2 via RESPIRATORY_TRACT
  Filled 2023-11-11 (×2): qty 8.8

## 2023-11-11 MED ORDER — AYR SALINE NASAL NA GEL
1.0000 | Freq: Every day | NASAL | Status: DC
  Filled 2023-11-11: qty 14.1

## 2023-11-11 MED ORDER — SENNOSIDES-DOCUSATE SODIUM 8.6-50 MG PO TABS
1.0000 | ORAL_TABLET | Freq: Two times a day (BID) | ORAL | Status: DC
Start: 2023-11-11 — End: 2023-11-14
  Administered 2023-11-11 – 2023-11-14 (×6): 1 via ORAL
  Filled 2023-11-11 (×7): qty 1

## 2023-11-11 MED ORDER — ROPINIROLE HCL 0.25 MG PO TABS
0.2500 mg | ORAL_TABLET | Freq: Every day | ORAL | Status: DC
  Administered 2023-11-11 – 2023-11-13 (×3): 0.25 mg via ORAL
  Filled 2023-11-11 (×4): qty 1

## 2023-11-11 MED ORDER — ASPIRIN 81 MG PO CHEW
81.0000 mg | CHEWABLE_TABLET | Freq: Every day | ORAL | Status: DC
  Administered 2023-11-11 – 2023-11-14 (×4): 81 mg via ORAL
  Filled 2023-11-11 (×4): qty 1

## 2023-11-11 MED ORDER — METOCLOPRAMIDE HCL 5 MG/ML IJ SOLN
5.0000 mg | Freq: Once | INTRAMUSCULAR | Status: AC
  Administered 2023-11-11: 5 mg via INTRAVENOUS
  Filled 2023-11-11: qty 2

## 2023-11-11 MED ORDER — SODIUM CHLORIDE 0.9 % IV SOLN
1.0000 g | Freq: Three times a day (TID) | INTRAVENOUS | Status: DC
  Administered 2023-11-11 – 2023-11-14 (×9): 1 g via INTRAVENOUS
  Filled 2023-11-11 (×10): qty 20

## 2023-11-11 MED ORDER — ACETAMINOPHEN 650 MG RE SUPP: 650.0000 mg | Freq: Four times a day (QID) | RECTAL | Status: DC | PRN

## 2023-11-11 MED ORDER — APIXABAN 5 MG PO TABS
5.0000 mg | ORAL_TABLET | Freq: Two times a day (BID) | ORAL | Status: DC
  Administered 2023-11-11 – 2023-11-14 (×7): 5 mg via ORAL
  Filled 2023-11-11 (×7): qty 1

## 2023-11-11 MED ORDER — OMEGA-3-ACID ETHYL ESTERS 1 G PO CAPS
1.0000 g | ORAL_CAPSULE | Freq: Every day | ORAL | Status: DC
  Administered 2023-11-11 – 2023-11-14 (×4): 1 g via ORAL
  Filled 2023-11-11 (×4): qty 1

## 2023-11-11 MED ORDER — FLUOXETINE HCL 20 MG PO CAPS
20.0000 mg | ORAL_CAPSULE | Freq: Every day | ORAL | Status: DC
  Administered 2023-11-11 – 2023-11-14 (×4): 20 mg via ORAL
  Filled 2023-11-11 (×4): qty 1

## 2023-11-11 MED ORDER — POLYETHYLENE GLYCOL 3350 17 G PO PACK
17.0000 g | PACK | Freq: Every day | ORAL | Status: DC
  Filled 2023-11-11 (×3): qty 1

## 2023-11-11 MED ORDER — ATENOLOL 50 MG PO TABS: 50.0000 mg | ORAL_TABLET | Freq: Two times a day (BID) | ORAL | Status: DC

## 2023-11-11 MED ORDER — MELATONIN 5 MG PO TABS
5.0000 mg | ORAL_TABLET | Freq: Every day | ORAL | Status: DC
Start: 2023-11-11 — End: 2023-11-14
  Administered 2023-11-11 – 2023-11-13 (×3): 5 mg via ORAL
  Filled 2023-11-11 (×3): qty 1

## 2023-11-11 MED ORDER — LEVOTHYROXINE SODIUM 88 MCG PO TABS
88.0000 ug | ORAL_TABLET | Freq: Every day | ORAL | Status: DC
  Administered 2023-11-11 – 2023-11-14 (×4): 88 ug via ORAL
  Filled 2023-11-11 (×4): qty 1

## 2023-11-11 MED ORDER — SODIUM CHLORIDE 0.9 % IV BOLUS
500.0000 mL | Freq: Once | INTRAVENOUS | Status: AC
  Administered 2023-11-11: 500 mL via INTRAVENOUS

## 2023-11-11 NOTE — Progress Notes (Signed)
 Pharmacy Antibiotic Note  Karla Hoover is a 53 y.o. female admitted on 11/10/2023 with sepsis.  Pharmacy has been consulted for vanc and merrem   dosing.  Plan: Vancomycin  1gm IV q24h (AUC 519.8, Scr 1.48) Merrem  1gm IV q8h Follow renal function, cultures and clinical course  Height: 5' 7.5 (171.5 cm) Weight: 93 kg (205 lb) IBW/kg (Calculated) : 62.75  Temp (24hrs), Avg:99.6 F (37.6 C), Min:97.6 F (36.4 C), Max:102.5 F (39.2 C)  Recent Labs  Lab 11/10/23 2254 11/10/23 2322  WBC 15.1*  --   CREATININE 1.48*  --   LATICACIDVEN  --  2.3*    Estimated Creatinine Clearance: 53.2 mL/min (A) (by C-G formula based on SCr of 1.48 mg/dL (H)).    Allergies  Allergen Reactions   Cipro  [Ciprofloxacin  Hcl] Hives   Guaifenesin  Anaphylaxis   Kiwi Extract Anaphylaxis and Rash   Levaquin [Levofloxacin] Shortness Of Breath and Rash   Strawberry Extract Anaphylaxis and Rash   Lioresal [Baclofen] Other (See Comments)    Near syncope/ fall    Antimicrobials this admission: 2/4 cefepime  x 1 2/4 vanc >> 2/4 merrem >>  Dose adjustments this admission:   Microbiology results: 2/3 BCx:  2/3 UCx:   Thank you for allowing pharmacy to be a part of this patient's care. Leeroy Mace RPh 11/11/2023, 6:15 AM

## 2023-11-11 NOTE — H&P (Addendum)
 History and Physical    Karla Hoover FMW:[id number] DOB: November 10, 1970 DOA: 11/10/2023  Patient coming from: Skilled nursing facility.  Chief Complaint: Fever chills.  HPI: Karla Hoover is a 53 y.o. female with history of diabetes mellitus type 2, COPD with ongoing tobacco abuse, grade 1 diastolic function, history of PE DVT and history of stroke with left-sided hemiplegia ESBL UTI recently admitted and discharged on 10/24/2023 after patient was septic and had grown Candida glabrata and was treated with micafungin  until 11/02/2023 following which patient's PICC line was removed from right upper extremity started to experience sudden onset of fever chills and low blood pressure at the living facility and was brought to the ER.  Patient had one episode of nausea vomiting.  Denies chest pain or shortness of breath abdominal pain or diarrhea.  ED Course: In the ER patient's blood pressure was in the low normal was given fluid bolus per sepsis protocol was febrile with temperature of 100 F tachycardic with elevated lactic acid COVID and flu test were negative WBC count was 15.1.  Cultures were sent and started on empiric antibiotic for sepsis.  UA is concerning for UTI.  Patient's blood pressure improved with fluids and stabilized.  Review of Systems: As per HPI, rest all negative.   Past Medical History:  Diagnosis Date   Allergies    Asthma    Bilateral pulmonary embolism (HCC) 11/15/2019   Bipolar disorder (HCC)    Chronic kidney disease, stage 3a (HCC) 02/09/2021   Chronic respiratory failure with hypoxia (HCC) 02/09/2021   COPD (chronic obstructive pulmonary disease) (HCC)    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   Former smoker 36 pack year smoking history     Diabetes mellitus without complication (HCC)    Essential hypertension 05/04/2009   Qualifier: Diagnosis of  By: Lang CMA, Jessica     GERD without esophagitis 02/09/2021   Grade I diastolic dysfunction  12/02/2021   Hypertension    Hypothyroidism 02/09/2021   Mixed 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 (HCC) 02/09/2021   Pulmonary embolism (HCC) 11/16/2019   Stroke (HCC)    Type 2 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 disease, with long-term current use of insulin  (HCC) 02/09/2021    Past Surgical History:  Procedure Laterality Date   TRANSESOPHAGEAL ECHOCARDIOGRAM (CATH LAB) N/A 10/23/2023   Procedure: TRANSESOPHAGEAL ECHOCARDIOGRAM;  Surgeon: Sheena Pugh, DO;  Location: [facility];  Service: Cardiovascular;  Laterality: N/A;     reports that she quit smoking about 3 years ago. Her smoking use included cigarettes. She started smoking about 40 years ago. She has a 18.1 pack-year smoking history. She has never used smokeless tobacco. She reports that she does not drink alcohol and does not use drugs.  Allergies  Allergen Reactions   Cipro  [Ciprofloxacin  Hcl] Hives   Guaifenesin  Anaphylaxis   Kiwi Extract Anaphylaxis and Rash   Levaquin [Levofloxacin] Shortness Of Breath and Rash   Strawberry Extract Anaphylaxis and Rash   Lioresal [Baclofen] Other (See Comments)    Near syncope/ fall    Family History  Problem Relation Age of Onset   Diabetes Mother    COPD Sister    Heart failure Sister    Breast cancer Sister     Prior to Admission medications   Medication Sig Start Date End Date Taking? Authorizing Provider  acetaminophen  (TYLENOL ) 500 MG tablet Take 1,000 mg by mouth 3 (three) times daily.   Yes [provider]  apixaban  (ELIQUIS ) 5 MG TABS tablet Take 1 tablet (5mg ) twice daily Patient taking differently: Take 5 mg by mouth 2 (two) times daily. 09/10/22  Yes Cindy Garnette POUR, MD  aspirin  81 MG chewable tablet Chew 81 mg by mouth in the morning.   Yes [provider]  atenolol  (TENORMIN ) 50 MG tablet Take 50 mg by mouth 2 (two) times daily.   Yes [provider]  atorvastatin  (LIPITOR ) 80 MG tablet  Take 1 tablet (80 mg total) by mouth daily. Patient taking differently: Take 80 mg by mouth at bedtime. 10/04/19  Yes Celestia Rosaline SQUIBB, NP  barrier cream (NON-SPECIFIED) CREA Apply 1 Application topically See admin instructions. Apply to affected areas 2 times a day   Yes [provider]  chlorhexidine  (PERIDEX ) 0.12 % solution Use as directed 15 mLs in the mouth or throat See admin instructions. Rinse the mouth with 15 ml's once a day as directed, then spit out   Yes [provider]  Cholecalciferol  (VITAMIN D3) 1.25 MG (50000 UT) CAPS Take 50,000 Units by mouth every Thursday.   Yes [provider]  clonazePAM  (KLONOPIN ) 0.5 MG tablet Take 1 tablet (0.5 mg total) by mouth 2 (two) times daily. 10/24/23  Yes Krishnan, Gokul, MD  cyclobenzaprine  (FLEXERIL ) 10 MG tablet Take 1 tablet (10 mg total) by mouth 3 (three) times daily as needed for muscle spasms. 10/04/19  Yes Celestia Rosaline SQUIBB, NP  EPINEPHrine (EPIPEN 2-PAK) 0.3 mg/0.3 mL IJ SOAJ injection Inject 0.3 mg into the muscle daily as needed for anaphylaxis.   Yes [provider]  ezetimibe  (ZETIA ) 10 MG tablet Take 10 mg by mouth at bedtime.   Yes [provider]  FLUoxetine  (PROZAC ) 20 MG capsule Take 20 mg by mouth daily.   Yes [provider]  fluticasone -salmeterol (ADVAIR) 100-50 MCG/ACT AEPB Inhale 1 puff into the lungs 2 (two) times daily.   Yes [provider]  gabapentin  (NEURONTIN ) 400 MG capsule Take 1 capsule (400 mg total) by mouth 3 (three) times daily. 10/24/23  Yes Krishnan, Gokul, MD  hydrALAZINE  (APRESOLINE ) 10 MG tablet Take 10 mg by mouth every 12 (twelve) hours as needed (for a Systolic reading >839 or Diastolic reading >04).   Yes [provider]  INCRUSE ELLIPTA  62.5 MCG/ACT AEPB Inhale 1 puff into the lungs daily.   Yes [provider]  insulin  glargine (LANTUS  SOLOSTAR) 100 UNIT/ML Solostar Pen Inject 15 Units into the skin at bedtime.   Yes  [provider]  insulin  lispro (HUMALOG ) 100 UNIT/ML injection Inject 0-14 Units into the skin See admin instructions. Inject 0-14 units twice daily as per sliding scale : CBG 0-200 : 0 units CBG 201-250 : 2 units CBG 251-300 : 4 units CBG 301-350 : 6 units CBG 351-400 : 8 units CBG 401-450 : 10 units CBG 451-500 : 12 units (if BP reads high, inject 14 units)   Yes [provider]  LACTOBACILLUS PO Take 1 capsule by mouth in the morning and at bedtime.   Yes [provider]  levothyroxine  (SYNTHROID ) 88 MCG tablet Take 88 mcg by mouth in the morning.   Yes [provider]  losartan  (COZAAR ) 25 MG tablet Take 37.5 mg by mouth daily.   Yes [provider]  MACROBID  100 MG capsule Take 100 mg by mouth in the morning.   Yes [provider]  Melatonin 5 MG TBDP Take 5 mg by mouth at bedtime.   Yes [provider]  Omega-3 Fatty Acids (FISH OIL) 1000 MG CAPS Take 1,000 mg by mouth at bedtime.   Yes [provider]  omeprazole  (PRILOSEC) 20 MG capsule Take 20 mg by mouth in the morning.   Yes [provider]  ondansetron  (ZOFRAN ) 4 MG tablet Take 4 mg by mouth every 8 (eight) hours as needed for nausea or vomiting.   Yes [provider]  polyethylene glycol powder (GLYCOLAX /MIRALAX ) 17 GM/SCOOP powder Take 17 g by mouth daily.   Yes [provider]  Propyl Glycol-Hydroxyethylcell (NASAL MOIST) GEL Place 1 application  into both nostrils at bedtime.   Yes [provider]  QUEtiapine  (SEROQUEL ) 25 MG tablet Take 1 tablet (25 mg total) by mouth at bedtime. 10/24/23  Yes Krishnan, Gokul, MD  rOPINIRole  (REQUIP ) 0.25 MG tablet Take 1 tablet (0.25 mg total) by mouth at bedtime. 10/24/23  Yes Verdene Purchase, MD  senna-docusate (SENOKOT-S) 8.6-50 MG tablet Take 1 tablet by mouth 2 (two) times daily. 10/24/23  Yes Krishnan, Gokul, MD  TRULICITY  1.5 MG/0.5ML SOAJ Inject 1.5 mg into the skin every Friday.    Yes [provider]  VOLTAREN  1 % GEL Apply 2 g topically See admin instructions. Apply 2 grams to the posterior neck  2 times a day   Yes [provider]  witch hazel-glycerin (TUCKS) pad Apply 1 Application topically every 4 (four) hours as needed for hemorrhoids.   Yes [provider]  albuterol  (VENTOLIN  HFA) 108 (90 Base) MCG/ACT inhaler Inhale 2 puffs into the lungs every 6 (six) hours as needed for wheezing or shortness of breath. Patient not taking: Reported on 11/11/2023 10/04/19   Celestia Rosaline SQUIBB, NP  OXYGEN  Inhale 2 L/min into the lungs as needed (for shortness of breath).    [provider]    Physical Exam: Constitutional: Moderately built and nourished. Vitals:   11/11/23 0140 11/11/23 0303 11/11/23 0330 11/11/23 0422  BP: 104/65  116/72   Pulse: 95  87 88  Resp: 18  20 20   Temp: 97.6 F (36.4 C) 98.8 F (37.1 C)    TempSrc: Oral Oral    SpO2: 96%  96% 100%  Weight:      Height:       Eyes: Anicteric no pallor. ENMT: No discharge from the ears eyes nose or mouth. Neck: No mass felt.  No neck rigidity. Respiratory: No rhonchi or crepitations. Cardiovascular: S1-S2 heard. Abdomen: Soft nontender bowel sound present. Musculoskeletal: No edema. Skin: No rash.  Has some scratch marks on the lower extremity on the left side. Neurologic: Alert awake oriented time place and person.  Has left side hemiplegia from previous stroke. Psychiatric: Appears normal.  Normal affect.   Labs on Admission: I have personally reviewed following labs and imaging studies  CBC: Recent Labs  Lab 11/10/23 2254  WBC 15.1*  NEUTROABS 12.4*  HGB 13.6  HCT 43.7  MCV 85.9  PLT 186   Basic Metabolic Panel: Recent Labs  Lab 11/10/23 2254  NA 135  K 4.1  CL 96*  CO2 24  GLUCOSE 273*  BUN 21*  CREATININE 1.48*  CALCIUM  9.0   GFR: Estimated Creatinine Clearance: 53.2 mL/min (A) (by C-G formula based on SCr of 1.48 mg/dL (H)). Liver Function  Tests: Recent Labs  Lab 11/10/23 2254  AST 23  ALT 14  ALKPHOS 64  BILITOT 1.0  PROT 7.7  ALBUMIN 3.5   No results for input(s): LIPASE, AMYLASE in the last 168 hours. No results for input(s):  AMMONIA in the last 168 hours. Coagulation Profile: Recent Labs  Lab 11/10/23 2254  INR 1.7*   Cardiac Enzymes: No results for input(s): CKTOTAL, CKMB, CKMBINDEX, TROPONINI in the last 168 hours. BNP (last 3 results) No results for input(s): PROBNP in the last 8760 hours. HbA1C: No results for input(s): HGBA1C in the last 72 hours. CBG: No results for input(s): GLUCAP in the last 168 hours. Lipid Profile: No results for input(s): CHOL, HDL, LDLCALC, TRIG, CHOLHDL, LDLDIRECT in the last 72 hours. Thyroid Function Tests: No results for input(s): TSH, T4TOTAL, FREET4, T3FREE, THYROIDAB in the last 72 hours. Anemia Panel: No results for input(s): VITAMINB12, FOLATE, FERRITIN, TIBC, IRON, RETICCTPCT in the last 72 hours. Urine analysis:    Component Value Date/Time   COLORURINE YELLOW 11/11/2023 0440   APPEARANCEUR CLEAR 11/11/2023 0440   APPEARANCEUR Clear 11/24/2012 1238   LABSPEC 1.008 11/11/2023 0440   LABSPEC 1.000 11/24/2012 1238   PHURINE 6.0 11/11/2023 0440   GLUCOSEU NEGATIVE 11/11/2023 0440   GLUCOSEU >=500 11/24/2012 1238   HGBUR LARGE (A) 11/11/2023 0440   BILIRUBINUR NEGATIVE 11/11/2023 0440   BILIRUBINUR Negative 11/24/2012 1238   KETONESUR NEGATIVE 11/11/2023 0440   PROTEINUR 100 (A) 11/11/2023 0440   UROBILINOGEN 1.0 04/30/2010 0902   NITRITE NEGATIVE 11/11/2023 0440   LEUKOCYTESUR SMALL (A) 11/11/2023 0440   LEUKOCYTESUR Negative 11/24/2012 1238   Sepsis Labs: @LABRCNTIP (procalcitonin:4,lacticidven:4) ) Recent Results (from the past 240 hours)  Resp panel by RT-PCR (RSV, Flu A&B, Covid) Anterior Nasal Swab     Status: None   Collection Time: 11/10/23 10:56 PM   Specimen: Anterior Nasal Swab  Result  Value Ref Range Status   SARS Coronavirus 2 by RT PCR NEGATIVE NEGATIVE Final    Comment: (NOTE) SARS-CoV-2 target nucleic acids are NOT DETECTED.  The SARS-CoV-2 RNA is generally detectable in upper respiratory specimens during the acute phase of infection. The lowest concentration of SARS-CoV-2 viral copies this assay can detect is 138 copies/mL. A negative result does not preclude SARS-Cov-2 infection and should not be used as the sole basis for treatment or other patient management decisions. A negative result may occur with  improper specimen collection/handling, submission of specimen other than nasopharyngeal swab, presence of viral mutation(s) within the areas targeted by this assay, and inadequate number of viral copies(<138 copies/mL). A negative result must be combined with clinical observations, patient history, and epidemiological information. The expected result is Negative.  Fact Sheet for Patients:  bloggercourse.com  Fact Sheet for Healthcare Providers:  seriousbroker.it  This test is no t yet approved or cleared by the [place]  FDA and  has been authorized for detection and/or diagnosis of SARS-CoV-2 by FDA under an Emergency Use Authorization (EUA). This EUA will remain  in effect (meaning this test can be used) for the duration of the COVID-19 declaration under Section 564(b)(1) of the Act, 21 U.S.C.section 360bbb-3(b)(1), unless the authorization is terminated  or revoked sooner.       Influenza A by PCR NEGATIVE NEGATIVE Final   Influenza B by PCR NEGATIVE NEGATIVE Final    Comment: (NOTE) The Xpert Xpress SARS-CoV-2/FLU/RSV plus assay is intended as an aid in the diagnosis of influenza from Nasopharyngeal swab specimens and should not be used as a sole basis for treatment. Nasal washings and aspirates are unacceptable for Xpert Xpress SARS-CoV-2/FLU/RSV testing.  Fact Sheet for  Patients: bloggercourse.com  Fact Sheet for Healthcare Providers: seriousbroker.it  This test is not yet approved or cleared by the [place]   FDA and has been authorized for detection and/or diagnosis of SARS-CoV-2 by FDA under an Emergency Use Authorization (EUA). This EUA will remain in effect (meaning this test can be used) for the duration of the COVID-19 declaration under Section 564(b)(1) of the Act, 21 U.S.C. section 360bbb-3(b)(1), unless the authorization is terminated or revoked.     Resp Syncytial Virus by PCR NEGATIVE NEGATIVE Final    Comment: (NOTE) Fact Sheet for Patients: bloggercourse.com  Fact Sheet for Healthcare Providers: seriousbroker.it  This test is not yet approved or cleared by the [place]  FDA and has been authorized for detection and/or diagnosis of SARS-CoV-2 by FDA under an Emergency Use Authorization (EUA). This EUA will remain in effect (meaning this test can be used) for the duration of the COVID-19 declaration under Section 564(b)(1) of the Act, 21 U.S.C. section 360bbb-3(b)(1), unless the authorization is terminated or revoked.  Performed at [facility], [address], [place], [place] [address]      Radiological Exams on Admission: DG Chest Port 1 View Result Date: 11/10/2023 CLINICAL DATA:  Questionable sepsis - evaluate for abnormality EXAM: PORTABLE CHEST 1 VIEW COMPARISON:  Chest x-ray 10/17/2023 FINDINGS: The heart and mediastinal contours are within normal limits. No focal consolidation. No pulmonary edema. No pleural effusion. No pneumothorax. No acute osseous abnormality. IMPRESSION: No active disease. Electronically Signed   By: Morgane  Naveau M.D.   On: 11/10/2023 23:38    EKG: Independently reviewed.  Sinus tachycardia.  Assessment/Plan Active Problems:   Acute renal failure superimposed on stage 3a  chronic kidney disease (HCC)   History of thromboembolism   COPD with chronic bronchitis (HCC)   Essential hypertension   DM (diabetes mellitus), type 2 (HCC)   Grade I diastolic dysfunction   Hypothyroidism   History of stroke   Tobacco abuse   Sepsis (HCC)    Sepsis -   patient was having low normal blood pressure with tachycardia fever leukocytosis concerning for sepsis physiology.  Source could be UTI.  For now keep patient on empiric antibiotics vancomycin  and meropenem  given history of ESBL UTI.  Patient has recently been admitted for Candida sepsis. Diabetes mellitus type 2 takes insulin  glargine 10 units at bedtime.  Last hemoglobin A1c 2 months ago was 10.3. Hypertension will hold Cozaar  due to patient having low normal blood pressure initially.  Will continue atenolol .  Follow blood pressure trends. COPD not actively wheezing advised about quitting.  On Dulera  and Incruse. History of PE and DVT on Eliquis . Hypothyroidism on Synthroid . History of stroke with left-sided hemiplegia on statins Zetia  and aspirin . History of depression on fluoxetine  quetiapine  and takes as needed Klonopin . Restless leg syndrome on Requip . Neuropathy on gabapentin . History of recurrent UTI on suppressive Macrobid . Hyperlipidemia on statins. Chronic kidney disease stage III.  Since patient has sepsis physiology at presentation will need close monitoring and more than 2 midnight stay and inpatient status.   DVT prophylaxis: Eliquis . Code Status: Full code. Family Communication: Patient's mother at the bedside. Disposition Plan: Monitored bed. Consults called: None. Admission status: Inpatient.

## 2023-11-11 NOTE — Progress Notes (Signed)
Patient seen and examined personally, I reviewed the chart, history and physical and admission note, done by admitting physician this morning and agree with the same with following addendum.  Please refer to the morning admission note for more detailed plan of care.  Briefly,  53 y.o.f w/ T2DM, COPD with ongoing tobacco abuse, grade 1 diastolic function, history of PE DVT and history of stroke with left-sided hemiplegia ESBL UTI recently admitted and discharged on 10/24/2023 after patient was septic and had grown Candida glabrata and was treated with micafungin until 11/02/2023 following which patient's PICC line was removed,started to experience sudden onset of fever chills and low blood pressure at the living facility and was brought to the ER.Patient had one episode of nausea vomiting.  She lives in [place] [facility] at baseline can trasfer to [facility] and able to alk few steps. Can't use the left arm 2/2 stroke ED Course: In the ER patient's blood pressure was in the low normal was given fluid bolus per sepsis protocol was febrile with temperature of 100 F tachycardic with elevated lactic acid COVID and flu test were negative WBC count was 15.1.  Cultures were sent and started on empiric antibiotic for sepsis.  UA is concerning for UTI.  Patient's blood pressure improved with fluids and stabilized.     Seen and examined this morning  C/o  low back and flank pain dysuria bp stable, no other complaints This morning fever up to 101 with mild tachycardia 106 vitals otherwise stable. Labs with lactic acidosis 2.7 from 2.3 creatinine down to 1.3 from 1.4 WBC 14.5 from 15.1, Last echo showed EF 60 to 65%, on 10/23/2023  A/P: Sepsis POA Source likely UTI History of ESBL UTI Recent sepsis with Candida glabrata treated with micafungin eot 11/02/23: Presenting with with fever tachycardia leukocytosis and soft BP.has mild lactic acidosis, patient received appropriate fluid bolus, rechecking lactic acid after additional  half liter bolus.  Continue on IV fluid hydration, Source likely UTI will keep on empiric vancomycin, meropenem.  Follow-up urine and blood culture to guide further therapy. Recent Labs  Lab 11/10/23 2254 11/10/23 2322 11/11/23 0600 11/11/23 0624  WBC 15.1*  --  14.5*  --   LATICACIDVEN  --  2.3*  --  2.7*    Diabetes mellitus type 2 w/ hyperglycemia: takes insulin glargine 10 units at bedtime. Last hemoglobin A1c 2 months ago was 10.3.  Keep on SSI for now  Hypertension: Hold home Cozaar and atenolol in the setting of sepsis.    COPD: Currently not wheezing, continue Dulera and Incruse   History of PE and DVT: Cont on Eliquis.  Rest of the seizure chronic medical issues: Continue home meds as below Hypothyroidism on Synthroid. History of stroke with left-sided hemiplegia on statins Zetia and aspirin. History of depression on fluoxetine quetiapine and takes as needed Klonopin. Restless leg syndrome on Requip. Neuropathy on gabapentin. History of recurrent UTI on suppressive Macrobid. Hyperlipidemia on statins. Chronic kidney disease stage III.

## 2023-11-11 NOTE — Sepsis Progress Note (Signed)
 Elink monitoring for the code sepsis protocol.

## 2023-11-11 NOTE — Hospital Course (Addendum)
 52 y.o.f w/ T2DM, COPD with ongoing tobacco abuse, grade 1 diastolic function, history of PE DVT and history of stroke with left-sided hemiplegia ESBL UTI recently admitted and discharged on 10/24/2023 after patient was septic and had grown Candida glabrata and was treated with micafungin  until 11/02/2023 following which patient's PICC line was removed,started to experience sudden onset of fever chills and low blood pressure at the living facility and was brought to the ER.Patient had one episode of nausea vomiting.  She lives in [place] [facility] at baseline can trasfer to [facility] and able to alk few steps. Can't use the left arm 2/2 stroke ED Course: In the ER patient's blood pressure was in the low normal was given fluid bolus per sepsis protocol was febrile with temperature of 100 F tachycardic with elevated lactic acid COVID and flu test were negative WBC count was 15.1.  Cultures were sent and started on empiric antibiotic for sepsis.  UA is concerning for UTI.  Patient's blood pressure improved with fluids and stabilized.  Patient continued on IV antibiotics IV fluids AKI improved SIRS criteria resolved.  Blood cultures remain negative since 2/3 and urine culture insignificant growth.  She does have chronic dysuria and flank pain.  Recent CT abdomen pelvis 1/13 right renal mass concerning for malignancy was noticed> subsequently had MRI on 1/14 showing 13 mm right renal lesion poorly evaluated advised follow-up with CT or MRI in 4 to 6 weeks.  Suspect renal most likely causing symptoms.  She will need outpatient neurology follow-up, sepsis rule out, she is stable for discharge back to facility.

## 2023-11-11 NOTE — ED Notes (Signed)
Call placed to lab to add urine culture to previous collection  

## 2023-11-12 DIAGNOSIS — A419 Sepsis, unspecified organism: Secondary | ICD-10-CM | POA: Diagnosis not present

## 2023-11-12 LAB — CBC
HCT: 37 % (ref 36.0–46.0)
Hemoglobin: 11.5 g/dL — ABNORMAL LOW (ref 12.0–15.0)
MCH: 26.8 pg (ref 26.0–34.0)
MCHC: 31.1 g/dL (ref 30.0–36.0)
MCV: 86.2 fL (ref 80.0–100.0)
Platelets: 129 10*3/uL — ABNORMAL LOW (ref 150–400)
RBC: 4.29 MIL/uL (ref 3.87–5.11)
RDW: 13.5 % (ref 11.5–15.5)
WBC: 7 10*3/uL (ref 4.0–10.5)
nRBC: 0 % (ref 0.0–0.2)

## 2023-11-12 LAB — RESPIRATORY PANEL BY PCR

## 2023-11-12 LAB — GLUCOSE, CAPILLARY
Glucose-Capillary: 157 mg/dL — ABNORMAL HIGH (ref 70–99)
Glucose-Capillary: 140 mg/dL — ABNORMAL HIGH (ref 70–99)
Glucose-Capillary: 217 mg/dL — ABNORMAL HIGH (ref 70–99)
Glucose-Capillary: 161 mg/dL — ABNORMAL HIGH (ref 70–99)

## 2023-11-12 LAB — BASIC METABOLIC PANEL
Anion gap: 10 (ref 5–15)
BUN: 14 mg/dL (ref 6–20)
CO2: 29 mmol/L (ref 22–32)
Calcium: 8.9 mg/dL (ref 8.9–10.3)
Chloride: 98 mmol/L (ref 98–111)
Creatinine, Ser: 1.2 mg/dL — ABNORMAL HIGH (ref 0.44–1.00)
GFR, Estimated: 55 mL/min — ABNORMAL LOW (ref >60)
Glucose, Bld: 165 mg/dL — ABNORMAL HIGH (ref 70–99)
Potassium: 3.7 mmol/L (ref 3.5–5.1)
Sodium: 137 mmol/L (ref 135–145)

## 2023-11-12 LAB — URINE CULTURE: Culture: <10000 — AB

## 2023-11-12 MED ORDER — VANCOMYCIN HCL 1250 MG/250ML IV SOLN
1250.0000 mg | INTRAVENOUS | Status: DC
  Administered 2023-11-12: 1250 mg via INTRAVENOUS
  Filled 2023-11-12: qty 250

## 2023-11-12 MED ORDER — LACTATED RINGERS IV SOLN: INTRAVENOUS | Status: DC

## 2023-11-12 NOTE — Evaluation (Signed)
 Physical Therapy Evaluation Patient Details Name: Karla Hoover MRN: [id number] DOB: [birth year] Today's Date: 11/12/2023  History of Present Illness  53 y.o.f  recently admitted and discharged on 10/24/2023 after patient was septic and had grown Candida glabrata and was treated with micafungin  until 11/02/2023 following which patient's PICC line was removed,started to experience sudden onset of fever chills and low blood pressure at the living facility and was brought to the ER. Dx of acute renal failure on CKD. Pt with h/o T2DM, COPD with ongoing tobacco abuse, grade 1 diastolic function, history of PE DVT and history of stroke with left-sided hemiplegia ESBL UTI  Clinical Impression  Pt admitted with above diagnosis. Pt ambulates short distances with a hemiwalker and self propels a wheelchair at baseline. Today she ambulated 71' with RUE on RW (hemiwalker not available), distance limited by fatigue, no loss of balance, SpO2 90% on room air walking. Pt currently with functional limitations due to the deficits listed below (see PT Problem List). Pt will benefit from acute skilled PT to increase their independence and safety with mobility to allow discharge.           If plan is discharge home, recommend the following: A little help with bathing/dressing/bathroom;A little help with walking and/or transfers;Assistance with cooking/housework;Assist for transportation;Help with stairs or ramp for entrance;Direct supervision/assist for medications management   Can travel by private vehicle        Equipment Recommendations None recommended by PT  Recommendations for Other Services       Functional Status Assessment Patient has had a recent decline in their functional status and demonstrates the ability to make significant improvements in function in a reasonable and predictable amount of time.     Precautions / Restrictions Precautions Precautions: Fall Precaution Comments: monitor O2, h/o L  HP Restrictions Weight Bearing Restrictions Per Provider Order: No      Mobility  Bed Mobility Overal bed mobility: Needs Assistance Bed Mobility: Sit to Supine       Sit to supine: Min assist   General bed mobility comments: assist for BLEs into bed    Transfers Overall transfer level: Needs assistance Equipment used: None Transfers: Sit to/from Stand Sit to Stand: Contact guard assist           General transfer comment: VCs hand placement    Ambulation/Gait Ambulation/Gait assistance: Contact guard assist Gait Distance (Feet): 30 Feet Assistive device: Rolling walker (2 wheels) (hemiwalker not available, will bring one for next session) Gait Pattern/deviations: Step-to pattern, Decreased step length - right, Decreased step length - left, Wide base of support Gait velocity: decr     General Gait Details: 25' with RUE on RW (LUE not functional), no loss of balance, SpO2 90% on room air  Stairs            Wheelchair Mobility     Tilt Bed    Modified Rankin (Stroke Patients Only)       Balance                                             Pertinent Vitals/Pain Pain Assessment Pain Assessment: No/denies pain    Home Living Family/patient expects to be discharged to:: Skilled nursing facility                   Additional Comments: [facility]  Prior Function               Mobility Comments: Pt reports performing transfers mod I, reports gets in [facility] independently. Propels manual w/c with R UE and B legs. Walks short distances with hemi walker ADLs Comments: MOD I with toileting, A with showers     Extremity/Trunk Assessment   Upper Extremity Assessment Upper Extremity Assessment: Defer to OT evaluation LUE Deficits / Details: hemiparesis 2* CVA 4 years ago per pt, not able to functionally use LUE    Lower Extremity Assessment Lower Extremity Assessment: Overall WFL for tasks assessed    Cervical /  Trunk Assessment Cervical / Trunk Assessment: Normal  Communication   Communication Communication: No apparent difficulties  Cognition Arousal: Alert Behavior During Therapy: WFL for tasks assessed/performed Overall Cognitive Status: Within Functional Limits for tasks assessed                                          General Comments      Exercises     Assessment/Plan    PT Assessment Patient needs continued PT services  PT Problem List Decreased activity tolerance;Decreased strength;Decreased mobility       PT Treatment Interventions Functional mobility training;Gait training;Therapeutic activities;Therapeutic exercise;Balance training    PT Goals (Current goals can be found in the Care Plan section)  Acute Rehab PT Goals Patient Stated Goal: to work on strengthening L arm; be able to walk farther PT Goal Formulation: With patient Time For Goal Achievement: 11/26/23 Potential to Achieve Goals: Good    Frequency Min 1X/week     Co-evaluation               AM-PAC PT 6 Clicks Mobility  Outcome Measure Help needed turning from your back to your side while in a flat bed without using bedrails?: A Little Help needed moving from lying on your back to sitting on the side of a flat bed without using bedrails?: A Little Help needed moving to and from a bed to a chair (including a wheelchair)?: A Little Help needed standing up from a chair using your arms (e.g., wheelchair or bedside chair)?: A Little Help needed to walk in hospital room?: A Little Help needed climbing 3-5 steps with a railing? : Total 6 Click Score: 16    End of Session Equipment Utilized During Treatment: Gait belt Activity Tolerance: Patient tolerated treatment well Patient left: in bed;with bed alarm set;with call bell/phone within reach Nurse Communication: Mobility status PT Visit Diagnosis: Other abnormalities of gait and mobility (R26.89);Hemiplegia and  hemiparesis;Difficulty in walking, not elsewhere classified (R26.2) Hemiplegia - Right/Left: Left Hemiplegia - dominant/non-dominant: Non-dominant    Time: 1420-1443 PT Time Calculation (min) (ACUTE ONLY): 23 min   Charges:   PT Evaluation $PT Eval Moderate Complexity: 1 Mod PT Treatments $Gait Training: 8-22 mins PT General Charges $$ ACUTE PT VISIT: 1 Visit         Sylvan Delon Copp PT 11/12/2023  Acute Rehabilitation Services  Office [phone]

## 2023-11-12 NOTE — Progress Notes (Signed)
 Pharmacy Antibiotic Note  Karla Hoover is a 53 y.o. female admitted on 11/10/2023 with  sepsis, UTI .  Pharmacy has been consulted for Vanco dosing.  ID: sepsis, R/O UTI,   H/o ESBL UTI, C/o  low back and flank pain and dysuria  Tmax 102, WBC 7, Scr 1.2 LA 2.3>2.7>1.1 down  Antimicrobials this admission: 2/4 cefepime  x 1 2/4 vanc >> 2/4 merrem >>   Dose adjustments this admission: 2/5: -Vanc 1gm IV q24h (AUC 493, Scr 1.37)>>Vanco 1250mg /24h (AUC 537, Scr 1.2)    Microbiology results: 2/3 BCx: ngtd 2/3 UCx:  2/3: COVID/Flu/RSV: neg  Plan: Con't Merrem  1g IV q8hr Increase Vancomycin  1250 mg IV Q 24 hrs. Goal AUC 400-550. Expected AUC: 537 SCr used: 1.2  Height: 5' 7.5 (171.5 cm) Weight: 93 kg (205 lb) IBW/kg (Calculated) : 62.75  Temp (24hrs), Avg:99.6 F (37.6 C), Min:97.8 F (36.6 C), Max:102 F (38.9 C)  Recent Labs  Lab 11/10/23 2254 11/10/23 2322 11/11/23 0600 11/11/23 0624 11/11/23 0959 11/12/23 0526  WBC 15.1*  --  14.5*  --   --  7.0  CREATININE 1.48*  --  1.37*  --   --  1.20*  LATICACIDVEN  --  2.3*  --  2.7* 1.1  --     Estimated Creatinine Clearance: 65.6 mL/min (A) (by C-G formula based on SCr of 1.2 mg/dL (H)).    Allergies  Allergen Reactions   Cipro  [Ciprofloxacin  Hcl] Hives   Guaifenesin  Anaphylaxis   Kiwi Extract Anaphylaxis and Rash   Levaquin [Levofloxacin] Shortness Of Breath and Rash   Strawberry Extract Anaphylaxis and Rash   Lioresal [Baclofen] Other (See Comments)    Near syncope/ fall    Karla Hoover, PharmD, BCPS Clinical Staff Pharmacist Karla Hoover 11/12/2023 9:22 AM

## 2023-11-12 NOTE — Plan of Care (Signed)

## 2023-11-12 NOTE — Progress Notes (Signed)
 PROGRESS NOTE Karla Hoover  FMW:[id number] DOB: 02-23-1971 DOA: 11/10/2023 PCP: Pcp, No  Brief Narrative/Hospital Course: 53 y.o.f w/ T2DM, COPD with ongoing tobacco abuse, grade 1 diastolic function, history of PE DVT and history of stroke with left-sided hemiplegia ESBL UTI recently admitted and discharged on 10/24/2023 after patient was septic and had grown Candida glabrata and was treated with micafungin  until 11/02/2023 following which patient's PICC line was removed,started to experience sudden onset of fever chills and low blood pressure at the living facility and was brought to the ER.Patient had one episode of nausea vomiting.  She lives in [facility] at baseline can trasfer to [facility] and able to alk few steps. Can't use the left arm 2/2 stroke ED Course: In the ER patient's blood pressure was in the low normal was given fluid bolus per sepsis protocol was febrile with temperature of 100 F tachycardic with elevated lactic acid COVID and flu test were negative WBC count was 15.1.  Cultures were sent and started on empiric antibiotic for sepsis.  UA is concerning for UTI.  Patient's blood pressure improved with fluids and stabilized.      Subjective: Seen this am Still having nausea dysurea and b/l flank pain and body aches Overnight patient had fever up to 102 at 10:50 PM Labs shows leukocytosis resolved, creatinine further down 1.2 Blood culture no growth so far, Urine culture in process  Assessment and Plan: Active Problems:   Acute renal failure superimposed on stage 3a chronic kidney disease (HCC)   History of thromboembolism   COPD with chronic bronchitis (HCC)   Essential hypertension   DM (diabetes mellitus), type 2 (HCC)   Grade I diastolic dysfunction   Hypothyroidism   History of stroke   Tobacco abuse   Sepsis (HCC)   Sepsis POA Source likely UTI History of ESBL UTI Recent sepsis with Candida glabrata treated with micafungin  eot 11/02/23: Presentedwith with fever  tachycardia leukocytosis and soft BP and w/ mild lactic acidosis> overall hemodynamically stable lactic acid leukocytosis resolved-given recent UTI and Candida infection concerning for sepsis, urine culture blood culture pending. patient having ongoing nausea flank pain dysuria.Influenza COVID negative.   Continue with empiric vancomycin , meropenem , continue symptomatic management Check respiratory virus panel. Recent Labs  Lab 11/10/23 2254 11/10/23 2322 11/11/23 0600 11/11/23 0624 11/11/23 0959 11/12/23 0526  WBC 15.1*  --  14.5*  --   --  7.0  LATICACIDVEN  --  2.3*  --  2.7* 1.1  --     Nausea/dysuria/flank pain: Continue to address as #1 also with antiemetics, pain medication and muscle relaxant.  Continue on gentle IV fluids RL  Diabetes mellitus type 2 w/ hyperglycemia: takes insulin  glargine 10 units at bedtime. Last hemoglobin A1c 2 months ago was 10.3.  Fairly controlled on sliding scale. Recent Labs  Lab 11/11/23 0801 11/11/23 1232 11/11/23 1638 11/11/23 2031 11/12/23 0720  GLUCAP 213* 136* 160* 129* 157*    Hypertension: BP remains stable.  Holding home Cozaar  and atenolol -hopefully can resume nitinol stone  COPD: Mild wheezing-continue Dulera  and Incruse  and supplemental o2. She uses oxygen  at home intermittently  History of PE and DVT: Stable, Cont on Eliquis .  Hypothyroidism cont on Synthroid .  History of stroke with left-sided hemiplegia: Continue her home statins Zetia  and aspirin .  History of depression: Pulmonary stable.  Continue home fuoxetine quetiapine  and PRN Klonopin .  Restless leg syndrome: cont Requip .  Neuropathy: cont gabapentin .  History of recurrent UTI: on suppressive Macrobid  PTA.  Hyperlipidemia:  cont statins.  Chronic kidney disease stage III a. stable at baseline, monitor  Obesity  Body mass index is 31.63 kg/m.  Will benefit with PCP follow-up, weight loss  healthy lifestyle   DVT prophylaxis:  Code Status:    Code Status: Full Code Family Communication: plan of care discussed with patient at bedside. Patient status is: Remains hospitalized because of severity of illness Level of care: Telemetry   Dispo: The patient is from: SNF            Anticipated disposition: TBD, will request PT OT evaluation today Objective: Vitals last 24 hrs: Vitals:   11/12/23 0001 11/12/23 0200 11/12/23 0437 11/12/23 0757  BP: 130/82  106/77 137/77  Pulse: 92  85 87  Resp:      Temp: 98.9 F (37.2 C) 97.8 F (36.6 C) 98.9 F (37.2 C) 98.2 F (36.8 C)  TempSrc: Oral Oral Oral Axillary  SpO2: 95%  91% 92%  Weight:      Height:       Weight change:   Physical Examination: General exam: alert awake, oriented  HEENT:Oral mucosa moist, Ear/Nose WNL grossly Respiratory system: Bilaterally clear BS,no use of accessory muscle Cardiovascular system: S1 & S2 +, No JVD. Gastrointestinal system: Abdomen soft,NT,ND, BS+ Nervous System: Alert, awake, moving all extremities,and following commands. Extremities: LE edema neg,distal peripheral pulses palpable and warm.  Skin: No rashes,no icterus. MSK: Normal muscle bulk,tone, power   Medications reviewed:  Scheduled Meds:  apixaban   5 mg Oral BID   aspirin   81 mg Oral Daily   atorvastatin   80 mg Oral QHS   clonazePAM   0.5 mg Oral BID   ezetimibe   10 mg Oral QHS   FLUoxetine   20 mg Oral Daily   gabapentin   400 mg Oral TID   insulin  aspart  0-9 Units Subcutaneous TID WC   insulin  glargine-yfgn  10 Units Subcutaneous QHS   levothyroxine   88 mcg Oral Q0600   melatonin  5 mg Oral QHS   mometasone -formoterol   2 puff Inhalation BID   omega-3 acid ethyl esters  1 g Oral Daily   pantoprazole   40 mg Oral Daily   polyethylene glycol  17 g Oral Daily   QUEtiapine   25 mg Oral QHS   rOPINIRole   0.25 mg Oral QHS   saline  1 Application Each Nare QHS   senna-docusate  1 tablet Oral BID   umeclidinium bromide   1 puff Inhalation Daily   [START ON 11/13/2023] Vitamin D3   50,000 Units Oral Q Thu   Continuous Infusions:  meropenem  (MERREM ) IV 1 g (11/12/23 9062)   vancomycin       Diet Order             Diet heart healthy/carb modified Room service appropriate? Yes; Fluid consistency: Thin  Diet effective now                  Intake/Output Summary (Last 24 hours) at 11/12/2023 0945 Last data filed at 11/12/2023 9062 Gross per 24 hour  Intake 2549.64 ml  Output 1100 ml  Net 1449.64 ml   Net IO Since Admission: 1,449.64 mL [11/12/23 0945]  Wt Readings from Last 3 Encounters:  11/10/23 93 kg  10/17/23 94 kg  09/06/23 98.9 kg     Unresulted Labs (From admission, onward)     Start     Ordered   11/12/23 0500  Basic metabolic panel  Daily,   R      11/11/23 0941  11/12/23 0500  CBC  Daily,   R      11/11/23 0941          Data Reviewed: I have personally reviewed following labs and imaging studies CBC: Recent Labs  Lab 11/10/23 2254 11/11/23 0600 11/12/23 0526  WBC 15.1* 14.5* 7.0  NEUTROABS 12.4* 10.9*  --   HGB 13.6 13.0 11.5*  HCT 43.7 42.6 37.0  MCV 85.9 87.3 86.2  PLT 186 167 129*   Basic Metabolic Panel:  Recent Labs  Lab 11/10/23 2254 11/11/23 0600 11/12/23 0526  NA 135 134* 137  K 4.1 4.8 3.7  CL 96* 99 98  CO2 24 24 29   GLUCOSE 273* 189* 165*  BUN 21* 19 14  CREATININE 1.48* 1.37* 1.20*  CALCIUM  9.0 9.0 8.9   GFR: Estimated Creatinine Clearance: 65.6 mL/min (A) (by C-G formula based on SCr of 1.2 mg/dL (H)). Liver Function Tests:  Recent Labs  Lab 11/10/23 2254 11/11/23 0600  AST 23 26  ALT 14 11  ALKPHOS 64 64  BILITOT 1.0 1.2  PROT 7.7 7.5  ALBUMIN 3.5 3.2*   No results for input(s): LIPASE, AMYLASE in the last 168 hours. No results for input(s): AMMONIA in the last 168 hours. Coagulation Profile:  Recent Labs  Lab 11/10/23 2254  INR 1.7*   Recent Results (from the past 240 hours)  Resp panel by RT-PCR (RSV, Flu A&B, Covid) Anterior Nasal Swab     Status: None   Collection Time:  11/10/23 10:56 PM   Specimen: Anterior Nasal Swab  Result Value Ref Range Status   SARS Coronavirus 2 by RT PCR NEGATIVE NEGATIVE Final    Comment: (NOTE) SARS-CoV-2 target nucleic acids are NOT DETECTED.  The SARS-CoV-2 RNA is generally detectable in upper respiratory specimens during the acute phase of infection. The lowest concentration of SARS-CoV-2 viral copies this assay can detect is 138 copies/mL. A negative result does not preclude SARS-Cov-2 infection and should not be used as the sole basis for treatment or other patient management decisions. A negative result may occur with  improper specimen collection/handling, submission of specimen other than nasopharyngeal swab, presence of viral mutation(s) within the areas targeted by this assay, and inadequate number of viral copies(<138 copies/mL). A negative result must be combined with clinical observations, patient history, and epidemiological information. The expected result is Negative.  Fact Sheet for Patients:  bloggercourse.com  Fact Sheet for Healthcare Providers:  seriousbroker.it  This test is no t yet approved or cleared by the [place]  FDA and  has been authorized for detection and/or diagnosis of SARS-CoV-2 by FDA under an Emergency Use Authorization (EUA). This EUA will remain  in effect (meaning this test can be used) for the duration of the COVID-19 declaration under Section 564(b)(1) of the Act, 21 U.S.C.section 360bbb-3(b)(1), unless the authorization is terminated  or revoked sooner.       Influenza A by PCR NEGATIVE NEGATIVE Final   Influenza B by PCR NEGATIVE NEGATIVE Final    Comment: (NOTE) The Xpert Xpress SARS-CoV-2/FLU/RSV plus assay is intended as an aid in the diagnosis of influenza from Nasopharyngeal swab specimens and should not be used as a sole basis for treatment. Nasal washings and aspirates are unacceptable for Xpert Xpress  SARS-CoV-2/FLU/RSV testing.  Fact Sheet for Patients: bloggercourse.com  Fact Sheet for Healthcare Providers: seriousbroker.it  This test is not yet approved or cleared by the [place]  FDA and has been authorized for detection and/or diagnosis of SARS-CoV-2 by FDA  under an Emergency Use Authorization (EUA). This EUA will remain in effect (meaning this test can be used) for the duration of the COVID-19 declaration under Section 564(b)(1) of the Act, 21 U.S.C. section 360bbb-3(b)(1), unless the authorization is terminated or revoked.     Resp Syncytial Virus by PCR NEGATIVE NEGATIVE Final    Comment: (NOTE) Fact Sheet for Patients: bloggercourse.com  Fact Sheet for Healthcare Providers: seriousbroker.it  This test is not yet approved or cleared by the [place]  FDA and has been authorized for detection and/or diagnosis of SARS-CoV-2 by FDA under an Emergency Use Authorization (EUA). This EUA will remain in effect (meaning this test can be used) for the duration of the COVID-19 declaration under Section 564(b)(1) of the Act, 21 U.S.C. section 360bbb-3(b)(1), unless the authorization is terminated or revoked.  Performed at [facility], [address], [place], [place] [address]   Blood Culture (routine x 2)     Status: None (Preliminary result)   Collection Time: 11/10/23 11:10 PM   Specimen: BLOOD RIGHT WRIST  Result Value Ref Range Status   Specimen Description   Final    BLOOD RIGHT WRIST Performed at [facility], [address] [place], [place] [address]    Special Requests   Final    BOTTLES DRAWN AEROBIC AND ANAEROBIC Blood Culture results may not be optimal due to an inadequate volume of blood received in culture bottles Performed at [facility], [address], [place], [place] [address]    Culture    Final    NO GROWTH 1 DAY Performed at [facility], [address], [place], [place] [address]    Report Status PENDING  Incomplete  Blood Culture (routine x 2)     Status: None (Preliminary result)   Collection Time: 11/11/23 12:10 AM   Specimen: BLOOD LEFT HAND  Result Value Ref Range Status   Specimen Description   Final    BLOOD LEFT HAND Performed at [facility], [address], [place], [place] [address]    Special Requests   Final    BOTTLES DRAWN AEROBIC AND ANAEROBIC Blood Culture results may not be optimal due to an inadequate volume of blood received in culture bottles Performed at [facility], [address], [place], [place] [address]    Culture   Final    NO GROWTH 1 DAY Performed at [facility], [address], [place], [place] [address]    Report Status PENDING  Incomplete  Urine Culture     Status: Abnormal   Collection Time: 11/11/23  4:40 AM   Specimen: Urine, Clean Catch  Result Value Ref Range Status   Specimen Description   Final    URINE, CLEAN CATCH Performed at [facility], [address] [place], [place] [address]    Special Requests   Final    NONE Performed at [facility], [address], [place], [place] [address]    Culture (A)  Final    <10,000 COLONIES/mL INSIGNIFICANT GROWTH Performed at [facility], [address] [place], [place] [address]    Report Status 11/12/2023 FINAL  Final    Antimicrobials/Microbiology: Anti-infectives (From admission, onward)    Start     Dose/Rate Route Frequency Ordered Stop   11/12/23 2000  vancomycin  (VANCOREADY) IVPB 1250 mg/250 mL        1,250 mg 166.7 mL/hr over 90 Minutes Intravenous  Every 24 hours 11/12/23 0922     11/11/23 2000  vancomycin  (VANCOCIN ) IVPB 1000 mg/200 mL premix  Status:  Discontinued        1,000 mg 200 mL/hr over 60 Minutes Intravenous Every 24 hours 11/11/23 0617 11/12/23 0922    11/11/23 0800  meropenem  (MERREM ) 1 g in sodium chloride  0.9 % 100 mL IVPB        1 g 200 mL/hr over 30 Minutes Intravenous Every 8 hours 11/11/23 0617     11/10/23 2345  ceFEPIme  (MAXIPIME ) 2 g in sodium chloride  0.9 % 100 mL IVPB        2 g 200 mL/hr over 30 Minutes Intravenous  Once 11/10/23 2344 11/11/23 0051   11/10/23 2345  metroNIDAZOLE  (FLAGYL ) IVPB 500 mg        500 mg 100 mL/hr over 60 Minutes Intravenous  Once 11/10/23 2344 11/11/23 0202   11/10/23 2345  vancomycin  (VANCOCIN ) IVPB 1000 mg/200 mL premix        1,000 mg 200 mL/hr over 60 Minutes Intravenous  Once 11/10/23 2344 11/11/23 0202         Component Value Date/Time   SDES  11/11/2023 0440    URINE, CLEAN CATCH Performed at [facility], [address], [place], [place] [address]    SPECREQUEST  11/11/2023 0440    NONE Performed at [facility] [address], [place], [place] [address]    CULT (A) 11/11/2023 0440    <10,000 COLONIES/mL INSIGNIFICANT GROWTH Performed at [facility], [address], [place], [place] [address]    REPTSTATUS 11/12/2023 FINAL 11/11/2023 0440     Radiology Studies: [facility] Chest Port 1 View Result Date: 11/10/2023 CLINICAL DATA:  Questionable sepsis - evaluate for abnormality EXAM: PORTABLE CHEST 1 VIEW COMPARISON:  Chest x-ray 10/17/2023 FINDINGS: The heart and mediastinal contours are within normal limits. No focal consolidation. No pulmonary edema. No pleural effusion. No pneumothorax. No acute osseous abnormality. IMPRESSION: No active disease. Electronically Signed   By: Morgane  Naveau M.D.   On: 11/10/2023 23:38     LOS: 1 day   Total time spent in review of labs and imaging, patient evaluation, formulation of plan, documentation and communication with family: 50 minutes  Mennie LAMY, MD  [facility]  11/12/2023, 9:45 AM

## 2023-11-13 DIAGNOSIS — N1832 Chronic kidney disease, stage 3b: Secondary | ICD-10-CM | POA: Diagnosis not present

## 2023-11-13 DIAGNOSIS — Z794 Long term (current) use of insulin: Secondary | ICD-10-CM | POA: Diagnosis not present

## 2023-11-13 DIAGNOSIS — E1122 Type 2 diabetes mellitus with diabetic chronic kidney disease: Secondary | ICD-10-CM | POA: Diagnosis not present

## 2023-11-13 LAB — BASIC METABOLIC PANEL
Anion gap: 12 (ref 5–15)
BUN: 12 mg/dL (ref 6–20)
CO2: 29 mmol/L (ref 22–32)
Calcium: 9.1 mg/dL (ref 8.9–10.3)
Chloride: 94 mmol/L — ABNORMAL LOW (ref 98–111)
Creatinine, Ser: 1.07 mg/dL — ABNORMAL HIGH (ref 0.44–1.00)
GFR, Estimated: >60 mL/min (ref >60)
Glucose, Bld: 189 mg/dL — ABNORMAL HIGH (ref 70–99)
Potassium: 4.2 mmol/L (ref 3.5–5.1)
Sodium: 135 mmol/L (ref 135–145)

## 2023-11-13 LAB — CBC
HCT: 34.7 % — ABNORMAL LOW (ref 36.0–46.0)
Hemoglobin: 10.8 g/dL — ABNORMAL LOW (ref 12.0–15.0)
MCH: 26.4 pg (ref 26.0–34.0)
MCHC: 31.1 g/dL (ref 30.0–36.0)
MCV: 84.8 fL (ref 80.0–100.0)
Platelets: 157 10*3/uL (ref 150–400)
RBC: 4.09 MIL/uL (ref 3.87–5.11)
RDW: 13.2 % (ref 11.5–15.5)
WBC: 5.3 10*3/uL (ref 4.0–10.5)
nRBC: 0 % (ref 0.0–0.2)

## 2023-11-13 LAB — GLUCOSE, CAPILLARY
Glucose-Capillary: 180 mg/dL — ABNORMAL HIGH (ref 70–99)
Glucose-Capillary: 162 mg/dL — ABNORMAL HIGH (ref 70–99)
Glucose-Capillary: 143 mg/dL — ABNORMAL HIGH (ref 70–99)
Glucose-Capillary: 85 mg/dL (ref 70–99)

## 2023-11-13 MED ORDER — VITAMIN D3 1.25 MG (50000 UT) PO CAPS: 50000.0000 [IU] | ORAL_CAPSULE | ORAL | Status: DC

## 2023-11-13 MED ORDER — VITAMIN D (ERGOCALCIFEROL) 1.25 MG (50000 UNIT) PO CAPS
50000.0000 [IU] | ORAL_CAPSULE | ORAL | Status: DC
  Administered 2023-11-13: 50000 [IU] via ORAL
  Filled 2023-11-13: qty 1

## 2023-11-13 NOTE — Progress Notes (Signed)
 PROGRESS NOTE Karla Hoover  FMW:[id number] DOB: Feb 23, 1971 DOA: 11/10/2023 PCP: Pcp, No  Brief Narrative/Hospital Course: 53 y.o.f w/ T2DM, COPD with ongoing tobacco abuse, grade 1 diastolic function, history of PE DVT and history of stroke with left-sided hemiplegia ESBL UTI recently admitted and discharged on 10/24/2023 after patient was septic and had grown Candida glabrata and was treated with micafungin  until 11/02/2023 following which patient's PICC line was removed,started to experience sudden onset of fever chills and low blood pressure at the living facility and was brought to the ER.Patient had one episode of nausea vomiting.  She lives in [place] [facility] at baseline can trasfer to [facility] and able to alk few steps. Can't use the left arm 2/2 stroke ED Course: In the ER patient's blood pressure was in the low normal was given fluid bolus per sepsis protocol was febrile with temperature of 100 F tachycardic with elevated lactic acid COVID and flu test were negative WBC count was 15.1.  Cultures were sent and started on empiric antibiotic for sepsis.  UA is concerning for UTI.  Patient's blood pressure improved with fluids and stabilized.      Subjective:  Overnight afebrile BP trending up  Reports nausea is improved, still have some dysuria and flank pain but overall better -the symptoms are chronic as per her  Laboratory stable WBC count at 5.3 Creat down to 1.2 Some itching after vancomycin  and discontinued  Assessment and Plan: Active Problems:   Acute renal failure superimposed on stage 3a chronic kidney disease (HCC)   History of thromboembolism   COPD with chronic bronchitis (HCC)   Essential hypertension   DM (diabetes mellitus), type 2 (HCC)   Grade I diastolic dysfunction   Hypothyroidism   History of stroke   Tobacco abuse   Sepsis (HCC)   SIRS Initially suspected sepsis due to UTI History of ESBL UTI Recent sepsis with Candida glabrata treated with micafungin  eot  11/02/23: Presentedwith with fever tachycardia leukocytosis and soft BP and w/ mild lactic acidosis> patient admitted for possible sepsis placed on meropenem  and vancomycin , workup so far shows blood culture no growth so far, urine culture less than 10,000 colonies Overall patient is clinically improved.  Unclear etiology-does have chronic dysuria flank pain. She had recent Candida infection and had completed antifungal outpatient. Respiratory virus panel, COVID-19 influenza negative.  Keep on meropenem  for 1 more days off vancomycin  Recent Labs  Lab 11/10/23 2254 11/10/23 2322 11/11/23 0600 11/11/23 0624 11/11/23 0959 11/12/23 0526 11/13/23 0553  WBC 15.1*  --  14.5*  --   --  7.0 5.3  LATICACIDVEN  --  2.3*  --  2.7* 1.1  --   --     Nausea/dysuria/flank pain: Likely chronic, nausea improved, continue symptomatic management muscle relaxant   Diabetes mellitus type 2 w/ hyperglycemia: takes insulin  glargine 10 units at bedtime. Last hemoglobin A1c 2 months ago was 10.3.  Fairly controlled on sliding scale. Recent Labs  Lab 11/12/23 1118 11/12/23 1626 11/12/23 2108 11/13/23 0723 11/13/23 1134  GLUCAP 140* 217* 161* 180* 162*   AKI: Likely from nausea and poor oral intake, creatinine 1.4 on admission improved with IV fluids encourage p.o.  Hypertension: BP remains stable.  Holding home Cozaar  and atenolol -hopefully can resume nitinol stone  COPD: Mild wheezing-continue Dulera  and Incruse  and supplemental o2. She uses oxygen  at home intermittently  History of PE and DVT: Stable, Cont on Eliquis .  Hypothyroidism cont on Synthroid .  History of stroke with left-sided hemiplegia: Continue  her home statins Zetia  and aspirin .  History of depression: Pulmonary stable.  Continue home fuoxetine quetiapine  and PRN Klonopin .  Restless leg syndrome: cont Requip .  Neuropathy: cont gabapentin .  History of recurrent UTI: on suppressive Macrobid  PTA.  Hyperlipidemia: cont  statins.  Chronic kidney disease stage III a. stable at baseline, monitor  Obesity  Body mass index is 31.63 kg/m.  Will benefit with PCP follow-up, weight loss  healthy lifestyle   DVT prophylaxis:  Code Status:   Code Status: Full Code Family Communication: plan of care discussed with patient at bedside. Patient status is: Remains hospitalized because of severity of illness Level of care: Telemetry   Dispo: The patient is from: SNF            Anticipated disposition: snf In 24 hrs  Objective: Vitals last 24 hrs: Vitals:   11/12/23 0757 11/12/23 1241 11/12/23 2015 11/13/23 0513  BP: 137/77 118/74 (!) 159/97 (!) 156/95  Pulse: 87 88 94 96  Resp:  20 16 16   Temp: 98.2 F (36.8 C) 98.3 F (36.8 C) 99.1 F (37.3 C) 99 F (37.2 C)  TempSrc: Axillary Oral Oral Oral  SpO2: 92% 95% 95% 91%  Weight:      Height:       Weight change:   Physical Examination: General exam: alert awake, oriented at baseline, older than stated age HEENT:Oral mucosa moist, Ear/Nose WNL grossly Respiratory system: Bilaterally clear BS,no use of accessory muscle Cardiovascular system: S1 & S2 +, No JVD. Gastrointestinal system: Abdomen soft,NT,ND, BS+ Nervous System: Alert, awake, moving all extremities,and following commands. Extremities: LE edema neg,distal peripheral pulses palpable and warm.  Skin: No rashes,no icterus. MSK: Normal muscle bulk,tone, power   Medications reviewed:  Scheduled Meds:  apixaban   5 mg Oral BID   aspirin   81 mg Oral Daily   atorvastatin   80 mg Oral QHS   clonazePAM   0.5 mg Oral BID   ezetimibe   10 mg Oral QHS   FLUoxetine   20 mg Oral Daily   gabapentin   400 mg Oral TID   insulin  aspart  0-9 Units Subcutaneous TID WC   insulin  glargine-yfgn  10 Units Subcutaneous QHS   levothyroxine   88 mcg Oral Q0600   melatonin  5 mg Oral QHS   mometasone -formoterol   2 puff Inhalation BID   omega-3 acid ethyl esters  1 g Oral Daily   pantoprazole   40 mg Oral Daily    polyethylene glycol  17 g Oral Daily   QUEtiapine   25 mg Oral QHS   rOPINIRole   0.25 mg Oral QHS   saline  1 Application Each Nare QHS   senna-docusate  1 tablet Oral BID   umeclidinium bromide   1 puff Inhalation Daily   Vitamin D  (Ergocalciferol )  50,000 Units Oral Q7 days   Continuous Infusions:  lactated ringers  50 mL/hr at 11/13/23 9378   meropenem  (MERREM ) IV 1 g (11/13/23 0924)    Diet Order             Diet heart healthy/carb modified Room service appropriate? Yes; Fluid consistency: Thin  Diet effective now                  Intake/Output Summary (Last 24 hours) at 11/13/2023 1208 Last data filed at 11/13/2023 9374 Gross per 24 hour  Intake 1721.69 ml  Output 1450 ml  Net 271.69 ml   Net IO Since Admission: 1,821.33 mL [11/13/23 1208]  Wt Readings from Last 3 Encounters:  11/10/23 93 kg  10/17/23 94 kg  09/06/23 98.9 kg     Unresulted Labs (From admission, onward)     Start     Ordered   11/12/23 0500  Basic metabolic panel  Daily,   R      11/11/23 0941   11/12/23 0500  CBC  Daily,   R      11/11/23 0941          Data Reviewed: I have personally reviewed following labs and imaging studies CBC: Recent Labs  Lab 11/10/23 2254 11/11/23 0600 11/12/23 0526 11/13/23 0553  WBC 15.1* 14.5* 7.0 5.3  NEUTROABS 12.4* 10.9*  --   --   HGB 13.6 13.0 11.5* 10.8*  HCT 43.7 42.6 37.0 34.7*  MCV 85.9 87.3 86.2 84.8  PLT 186 167 129* 157   Basic Metabolic Panel:  Recent Labs  Lab 11/10/23 2254 11/11/23 0600 11/12/23 0526 11/13/23 0553  NA 135 134* 137 135  K 4.1 4.8 3.7 4.2  CL 96* 99 98 94*  CO2 24 24 29 29   GLUCOSE 273* 189* 165* 189*  BUN 21* 19 14 12   CREATININE 1.48* 1.37* 1.20* 1.07*  CALCIUM  9.0 9.0 8.9 9.1   GFR: Estimated Creatinine Clearance: 73.5 mL/min (A) (by C-G formula based on SCr of 1.07 mg/dL (H)). Liver Function Tests:  Recent Labs  Lab 11/10/23 2254 11/11/23 0600  AST 23 26  ALT 14 11  ALKPHOS 64 64  BILITOT 1.0 1.2  PROT  7.7 7.5  ALBUMIN 3.5 3.2*   No results for input(s): LIPASE, AMYLASE in the last 168 hours. No results for input(s): AMMONIA in the last 168 hours. Coagulation Profile:  Recent Labs  Lab 11/10/23 2254  INR 1.7*   Recent Results (from the past 240 hours)  Resp panel by RT-PCR (RSV, Flu A&B, Covid) Anterior Nasal Swab     Status: None   Collection Time: 11/10/23 10:56 PM   Specimen: Anterior Nasal Swab  Result Value Ref Range Status   SARS Coronavirus 2 by RT PCR NEGATIVE NEGATIVE Final    Comment: (NOTE) SARS-CoV-2 target nucleic acids are NOT DETECTED.  The SARS-CoV-2 RNA is generally detectable in upper respiratory specimens during the acute phase of infection. The lowest concentration of SARS-CoV-2 viral copies this assay can detect is 138 copies/mL. A negative result does not preclude SARS-Cov-2 infection and should not be used as the sole basis for treatment or other patient management decisions. A negative result may occur with  improper specimen collection/handling, submission of specimen other than nasopharyngeal swab, presence of viral mutation(s) within the areas targeted by this assay, and inadequate number of viral copies(<138 copies/mL). A negative result must be combined with clinical observations, patient history, and epidemiological information. The expected result is Negative.  Fact Sheet for Patients:  bloggercourse.com  Fact Sheet for Healthcare Providers:  seriousbroker.it  This test is no t yet approved or cleared by the [place]  FDA and  has been authorized for detection and/or diagnosis of SARS-CoV-2 by FDA under an Emergency Use Authorization (EUA). This EUA will remain  in effect (meaning this test can be used) for the duration of the COVID-19 declaration under Section 564(b)(1) of the Act, 21 U.S.C.section 360bbb-3(b)(1), unless the authorization is terminated  or revoked sooner.        Influenza A by PCR NEGATIVE NEGATIVE Final   Influenza B by PCR NEGATIVE NEGATIVE Final    Comment: (NOTE) The Xpert Xpress SARS-CoV-2/FLU/RSV plus assay is intended as an aid in the  diagnosis of influenza from Nasopharyngeal swab specimens and should not be used as a sole basis for treatment. Nasal washings and aspirates are unacceptable for Xpert Xpress SARS-CoV-2/FLU/RSV testing.  Fact Sheet for Patients: bloggercourse.com  Fact Sheet for Healthcare Providers: seriousbroker.it  This test is not yet approved or cleared by the [place]  FDA and has been authorized for detection and/or diagnosis of SARS-CoV-2 by FDA under an Emergency Use Authorization (EUA). This EUA will remain in effect (meaning this test can be used) for the duration of the COVID-19 declaration under Section 564(b)(1) of the Act, 21 U.S.C. section 360bbb-3(b)(1), unless the authorization is terminated or revoked.     Resp Syncytial Virus by PCR NEGATIVE NEGATIVE Final    Comment: (NOTE) Fact Sheet for Patients: bloggercourse.com  Fact Sheet for Healthcare Providers: seriousbroker.it  This test is not yet approved or cleared by the [place]  FDA and has been authorized for detection and/or diagnosis of SARS-CoV-2 by FDA under an Emergency Use Authorization (EUA). This EUA will remain in effect (meaning this test can be used) for the duration of the COVID-19 declaration under Section 564(b)(1) of the Act, 21 U.S.C. section 360bbb-3(b)(1), unless the authorization is terminated or revoked.  Performed at [facility], [address] [place], [place] [address]   Blood Culture (routine x 2)     Status: None (Preliminary result)   Collection Time: 11/10/23 11:10 PM   Specimen: BLOOD RIGHT WRIST  Result Value Ref Range Status   Specimen Description   Final    BLOOD RIGHT  WRIST Performed at [facility], [address], [place], [place] [address]    Special Requests   Final    BOTTLES DRAWN AEROBIC AND ANAEROBIC Blood Culture results may not be optimal due to an inadequate volume of blood received in culture bottles Performed at [facility], [address] [place], [place] [address]    Culture   Final    NO GROWTH 2 DAYS Performed at [facility], [address], [place], [place] [address]    Report Status PENDING  Incomplete  Blood Culture (routine x 2)     Status: None (Preliminary result)   Collection Time: 11/11/23 12:10 AM   Specimen: BLOOD LEFT HAND  Result Value Ref Range Status   Specimen Description   Final    BLOOD LEFT HAND Performed at [facility], [address] [place], [place] [address]    Special Requests   Final    BOTTLES DRAWN AEROBIC AND ANAEROBIC Blood Culture results may not be optimal due to an inadequate volume of blood received in culture bottles Performed at [facility], [address], [place], [place] [address]    Culture   Final    NO GROWTH 2 DAYS Performed at [facility], [address] [place], [place] [address]    Report Status PENDING  Incomplete  Urine Culture     Status: Abnormal   Collection Time: 11/11/23  4:40 AM   Specimen: Urine, Clean Catch  Result Value Ref Range Status   Specimen Description   Final    URINE, CLEAN CATCH Performed at [facility], [address], [place], [place] [address]    Special Requests   Final    NONE Performed at [facility], [address] [place], [place] [address]    Culture (A)  Final    <10,000 COLONIES/mL INSIGNIFICANT GROWTH Performed  at [facility], [address] [place], [place] [address]    Report Status 11/12/2023 FINAL  Final  Respiratory (~20 pathogens) panel by PCR     Status: None   Collection Time: 11/12/23 12:14  PM   Specimen: Nasopharyngeal Swab; Respiratory  Result Value Ref Range Status   Adenovirus NOT DETECTED NOT DETECTED Final   Coronavirus 229E NOT DETECTED NOT DETECTED Final    Comment: (NOTE) The Coronavirus on the Respiratory Panel, DOES NOT test for the novel  Coronavirus (2019 nCoV)    Coronavirus HKU1 NOT DETECTED NOT DETECTED Final   Coronavirus NL63 NOT DETECTED NOT DETECTED Final   Coronavirus OC43 NOT DETECTED NOT DETECTED Final   Metapneumovirus NOT DETECTED NOT DETECTED Final   Rhinovirus / Enterovirus NOT DETECTED NOT DETECTED Final   Influenza A NOT DETECTED NOT DETECTED Final   Influenza B NOT DETECTED NOT DETECTED Final   Parainfluenza Virus 1 NOT DETECTED NOT DETECTED Final   Parainfluenza Virus 2 NOT DETECTED NOT DETECTED Final   Parainfluenza Virus 3 NOT DETECTED NOT DETECTED Final   Parainfluenza Virus 4 NOT DETECTED NOT DETECTED Final   Respiratory Syncytial Virus NOT DETECTED NOT DETECTED Final   Bordetella pertussis NOT DETECTED NOT DETECTED Final   Bordetella Parapertussis NOT DETECTED NOT DETECTED Final   Chlamydophila pneumoniae NOT DETECTED NOT DETECTED Final   Mycoplasma pneumoniae NOT DETECTED NOT DETECTED Final    Comment: Performed at [facility], [address], [place], [place] [address]    Antimicrobials/Microbiology: Anti-infectives (From admission, onward)    Start     Dose/Rate Route Frequency Ordered Stop   11/12/23 2000  vancomycin  (VANCOREADY) IVPB 1250 mg/250 mL  Status:  Discontinued        1,250 mg 166.7 mL/hr over 90 Minutes Intravenous Every 24 hours 11/12/23 0922 11/12/23 2052   11/11/23 2000  vancomycin  (VANCOCIN ) IVPB 1000 mg/200 mL premix  Status:  Discontinued        1,000 mg 200 mL/hr over 60 Minutes Intravenous Every 24 hours 11/11/23 0617 11/12/23 0922   11/11/23 0800  meropenem  (MERREM ) 1 g in sodium chloride  0.9 % 100 mL IVPB        1 g 200 mL/hr over 30 Minutes Intravenous Every 8 hours 11/11/23 0617     11/10/23  2345  ceFEPIme  (MAXIPIME ) 2 g in sodium chloride  0.9 % 100 mL IVPB        2 g 200 mL/hr over 30 Minutes Intravenous  Once 11/10/23 2344 11/11/23 0051   11/10/23 2345  metroNIDAZOLE  (FLAGYL ) IVPB 500 mg        500 mg 100 mL/hr over 60 Minutes Intravenous  Once 11/10/23 2344 11/11/23 0202   11/10/23 2345  vancomycin  (VANCOCIN ) IVPB 1000 mg/200 mL premix        1,000 mg 200 mL/hr over 60 Minutes Intravenous  Once 11/10/23 2344 11/11/23 0202         Component Value Date/Time   SDES  11/11/2023 0440    URINE, CLEAN CATCH Performed at [facility], [address] [place], [place] [address]    SPECREQUEST  11/11/2023 0440    NONE Performed at [facility], [address], [place], [place] [address]    CULT (A) 11/11/2023 0440    <10,000 COLONIES/mL INSIGNIFICANT GROWTH Performed at [facility], [address] [place], [place] [address]    REPTSTATUS 11/12/2023 FINAL 11/11/2023 0440     Radiology Studies: No results  found.    LOS: 2 days   Total time spent in review of labs and imaging, patient evaluation, formulation of plan, documentation and communication with family: 35 minutes  Mennie LAMY, MD  [facility]  11/13/2023, 12:08 PM

## 2023-11-13 NOTE — Plan of Care (Signed)
 PRN pain meds given for chronic pain. Ambulated in hallway with PT. Provider to bedside this am to discuss no growth from cultures. IV abx cont'd  Problem: Education: Goal: Ability to describe self-care measures that may prevent or decrease complications (Diabetes Survival Skills Education) will improve Outcome: Progressing   Problem: Fluid Volume: Goal: Ability to maintain a balanced intake and output will improve Outcome: Progressing

## 2023-11-13 NOTE — Progress Notes (Signed)
 Orthopedic Tech Progress Note Patient Details:  Karla Hoover 1971-04-18 [id number]  Patient ID: [name], female   DOB: 04/15/1971, 53 y.o.   MRN: [id number]  [name] 11/13/2023, 3:33 PM Resting hand splint ordered from [facility] @1530  on 11/13/23.

## 2023-11-13 NOTE — Progress Notes (Signed)
 Patient reports itching and redness at the IV site after about 10 mins following administration of Vancomycin . IV stopped immediately and pharmacy was contacted. No additional symptoms such as pain, shortness of breath, swelling, or rash reported. Symptoms subsided about 5 mins after stopping Vanc. IV flushed without complaints. On call provider and charge nurse aware.

## 2023-11-13 NOTE — [place] FL2 (Signed)
 [place]  MEDICAID FL2 LEVEL OF CARE FORM     IDENTIFICATION  Patient Name: Karla Hoover Birthdate: 1971-08-16 Sex: female Admission Date (Current Location): 11/10/2023  [facility] and [place] Number:  Producer, Television/film/video and Address:  [facility],  [address] [place] [address]      Provider Number: [id number]  Attending Physician Name and Address:  Christobal Guadalajara, MD  Relative Name and Phone Number:  Sharlet Freeman(mother)[phone]    Current Level of Care: Hospital Recommended Level of Care: Nursing Facility Prior Approval Number:    Date Approved/Denied:   PASRR Number:    Discharge Plan: Other (Comment) (LTC)    Current Diagnoses: Patient Active Problem List   Diagnosis Date Noted   Aggressive behavior 10/17/2023   Prolonged QT interval 10/17/2023   UTI due to extended-spectrum beta lactamase (ESBL) producing Escherichia coli 09/06/2023   Acute metabolic encephalopathy 09/06/2023   Acute on chronic respiratory failure with hypoxia and hypercapnia (HCC) 09/06/2023   Hypotension 09/06/2023   UTI (urinary tract infection) 01/21/2023   Leukocytosis 01/21/2023   Class 1 obesity due to excess calories with body mass index (BMI) of 32.0 to 32.9 in adult 12/18/2022   Dyslipidemia 12/18/2022   Sepsis due to urinary tract infection (HCC) 12/13/2022   Hyponatremia 12/13/2022   CKD (chronic kidney disease) stage 3, GFR 30-59 ml/min (HCC) 12/13/2022   Sepsis (HCC) 09/07/2022   AKI (acute kidney injury) (HCC) 08/30/2022   History of pulmonary embolism 08/30/2022   Hemiparesis affecting left side as late effect of cerebrovascular accident (CVA) (HCC) 08/30/2022   Allergies    Grade I diastolic dysfunction 12/02/2021   Mild protein-calorie malnutrition (HCC) 12/02/2021   COPD with acute exacerbation (HCC) 12/02/2021   Acute renal failure superimposed on stage 3a chronic kidney disease (HCC) 02/09/2021   Chronic respiratory failure with hypoxia (HCC)  02/09/2021   History of thromboembolism 02/09/2021   Type 2 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 disease, with long-term current use of insulin  (HCC) 02/09/2021   Mixed 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 (HCC) 02/09/2021   GERD without esophagitis 02/09/2021   Hypothyroidism 02/09/2021   Sepsis secondary to UTI (HCC) 02/08/2021   Lymphadenopathy 12/28/2020   hx of PE/DVT 12/01/2019   Overweight (BMI 25.0-29.9) 11/22/2019   Tobacco abuse 11/16/2019   DM (diabetes mellitus), type 2 (HCC) 11/15/2019   History of stroke 11/15/2019   Depression 02/15/2018   Anxiety 01/24/2017   Essential hypertension 05/04/2009   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    Orientation RESPIRATION BLADDER Height & Weight     Self, Time, Situation, Place  O2 Incontinent Weight: 93 kg Height:  5' 7.5 (171.5 cm)  BEHAVIORAL SYMPTOMS/MOOD NEUROLOGICAL BOWEL NUTRITION STATUS      Incontinent Diet (CHO MOD)  AMBULATORY STATUS COMMUNICATION OF NEEDS Skin   Limited Assist Verbally Normal                       Personal Care Assistance Level of Assistance  Bathing, Feeding, Dressing Bathing Assistance: Limited assistance Feeding assistance: Limited assistance Dressing Assistance: Limited assistance     Functional Limitations Info  Sight, Hearing, Speech Sight Info: Impaired (readers) Hearing Info: Adequate Speech Info: Adequate    SPECIAL CARE FACTORS FREQUENCY  OT (By licensed OT), PT (By licensed PT)     PT Frequency: 2x wk OT Frequency: 2x wk            Contractures Contractures Info:  Not present    Additional Factors Info  Code Status, Allergies Code Status Info: Full Allergies Info: : Cipro  (Ciprofloxacin  Hcl), Guaifenesin , Kiwi Extract, Levaquin (Levofloxacin), Strawberry Extract, Lioresal (Baclofen), Vancomycin            Current Medications (11/13/2023):  This is the current hospital active medication list Current  Facility-Administered Medications  Medication Dose Route Frequency Provider Last Rate Last Admin   acetaminophen  (TYLENOL ) tablet 650 mg  650 mg Oral Q6H PRN Franky Redia SAILOR, MD   650 mg at 11/11/23 2256   Or   acetaminophen  (TYLENOL ) suppository 650 mg  650 mg Rectal Q6H PRN Franky Redia SAILOR, MD       apixaban  (ELIQUIS ) tablet 5 mg  5 mg Oral BID Franky Redia SAILOR, MD   5 mg at 11/13/23 9080   aspirin  chewable tablet 81 mg  81 mg Oral Daily Franky Redia SAILOR, MD   81 mg at 11/13/23 9081   atorvastatin  (LIPITOR ) tablet 80 mg  80 mg Oral QHS Franky Redia SAILOR, MD   80 mg at 11/12/23 2300   clonazePAM  (KLONOPIN ) tablet 0.5 mg  0.5 mg Oral BID Franky Redia SAILOR, MD   0.5 mg at 11/13/23 9080   cyclobenzaprine  (FLEXERIL ) tablet 10 mg  10 mg Oral TID PRN Kakrakandy, Arshad N, MD   10 mg at 11/11/23 2255   ezetimibe  (ZETIA ) tablet 10 mg  10 mg Oral QHS Franky Redia SAILOR, MD   10 mg at 11/12/23 2300   FLUoxetine  (PROZAC ) capsule 20 mg  20 mg Oral Daily Franky Redia SAILOR, MD   20 mg at 11/13/23 9081   gabapentin  (NEURONTIN ) capsule 400 mg  400 mg Oral TID Kakrakandy, Arshad N, MD   400 mg at 11/13/23 9081   insulin  aspart (novoLOG ) injection 0-9 Units  0-9 Units Subcutaneous TID WC Kakrakandy, Arshad N, MD   2 Units at 11/13/23 1137   insulin  glargine-yfgn (SEMGLEE ) injection 10 Units  10 Units Subcutaneous QHS Kakrakandy, Arshad N, MD   10 Units at 11/12/23 2300   levothyroxine  (SYNTHROID ) tablet 88 mcg  88 mcg Oral Q0600 Kakrakandy, Arshad N, MD   88 mcg at 11/13/23 9380   melatonin tablet 5 mg  5 mg Oral QHS Kakrakandy, Arshad N, MD   5 mg at 11/12/23 2300   meropenem  (MERREM ) 1 g in sodium chloride  0.9 % 100 mL IVPB  1 g Intravenous Q8H Leonce Vernell BRAVO, RPH 200 mL/hr at 11/13/23 9075 1 g at 11/13/23 9075   mometasone -formoterol  (DULERA ) 100-5 MCG/ACT inhaler 2 puff  2 puff Inhalation BID Franky Redia SAILOR, MD       omega-3 acid ethyl esters (LOVAZA ) capsule 1 g  1 g Oral  Daily Kakrakandy, Arshad N, MD   1 g at 11/13/23 9081   pantoprazole  (PROTONIX ) EC tablet 40 mg  40 mg Oral Daily Kakrakandy, Arshad N, MD   40 mg at 11/13/23 9077   polyethylene glycol (MIRALAX  / GLYCOLAX ) packet 17 g  17 g Oral Daily Franky Redia SAILOR, MD       QUEtiapine  (SEROQUEL ) tablet 25 mg  25 mg Oral QHS Kakrakandy, Arshad N, MD   25 mg at 11/12/23 2300   rOPINIRole  (REQUIP ) tablet 0.25 mg  0.25 mg Oral QHS Kakrakandy, Arshad N, MD   0.25 mg at 11/12/23 2300   saline (AYR) nasal gel with aloe 1 Application  1 Application Each Nare QHS Kakrakandy, Arshad N, MD       senna-docusate (Senokot-S) tablet 1  tablet  1 tablet Oral BID Franky Redia SAILOR, MD   1 tablet at 11/13/23 9081   traMADol  (ULTRAM ) tablet 50 mg  50 mg Oral Q12H PRN Christobal Guadalajara, MD   50 mg at 11/13/23 0920   umeclidinium bromide  (INCRUSE ELLIPTA ) 62.5 MCG/ACT 1 puff  1 puff Inhalation Daily Franky Redia SAILOR, MD       Vitamin D  (Ergocalciferol ) (DRISDOL ) 1.25 MG (50000 UNIT) capsule 50,000 Units  50,000 Units Oral Q7 days Christobal Guadalajara, MD   50,000 Units at 11/13/23 1138     Discharge Medications: Please see discharge summary for a list of discharge medications.  Relevant Imaging Results:  Relevant Lab Results:   Additional Information ss#[id number]  Jacory Kamel, [place], [place]

## 2023-11-13 NOTE — Progress Notes (Signed)
     Patient Name: Karla Hoover           DOB: 16-Jul-1971  MRN: [id number]      Admission Date: 11/10/2023  Attending Provider: Christobal Guadalajara, MD  Primary Diagnosis: <principal problem not specified>   Level of care: Telemetry   OVERNIGHT PROGRESS REPORT   allergy to vanc?   Excessive itching and arm redness.  Symptoms localized to extremity. After stopping vanc, symptoms quickly relieved without requiring medical intervention.  Pt refusing vanc at this time.     Momo Braun, DNP, ACNPC- AG [facility]

## 2023-11-13 NOTE — Evaluation (Signed)
 Occupational Therapy Evaluation Patient Details Name: Karla Hoover MRN: [id number] DOB: Jul 03, 1971 Today's Date: 11/13/2023   History of Present Illness 53 y.o.f  recently admitted and discharged on 10/24/2023 after patient was septic and had grown Candida glabrata and was treated with micafungin  until 11/02/2023 following which patient's PICC line was removed,started to experience sudden onset of fever chills and low blood pressure at the living facility and was brought to the ER. Dx of acute renal failure on CKD. Pt with h/o T2DM, COPD with ongoing tobacco abuse, grade 1 diastolic function, history of PE DVT and history of stroke with left-sided hemiplegia ESBL UTI   Clinical Impression   Patient admitted for the diagnosis above.  Patient is very motivated to work with rehab, wants to continue mobility, and OT able to provide prolonged stretch to spastic LUE for tone reduction and ROM at each pivot.  OT will contact MD regarding possible L resting hand splint for nighttime use.  Recommend discharge to facility when medically cleared.         If plan is discharge home, recommend the following:      Functional Status Assessment  Patient has had a recent decline in their functional status and demonstrates the ability to make significant improvements in function in a reasonable and predictable amount of time.  Equipment Recommendations  None recommended by OT    Recommendations for Other Services       Precautions / Restrictions Precautions Precautions: Fall Precaution Comments: monitor O2, h/o L HP Restrictions Weight Bearing Restrictions Per Provider Order: No      Mobility Bed Mobility Overal bed mobility: Needs Assistance Bed Mobility: Sit to Supine       Sit to supine: Min assist     Patient Response: Cooperative  Transfers Overall transfer level: Needs assistance Equipment used: Hemi-walker Transfers: Sit to/from Stand Sit to Stand: Contact guard assist                   Balance Overall balance assessment: Mild deficits observed, not formally tested                                         ADL either performed or assessed with clinical judgement   ADL       Grooming: Wash/dry hands;Wash/dry face;Contact guard assist;Sitting   Upper Body Bathing: Moderate assistance;Minimal assistance;Sitting       Upper Body Dressing : Minimal assistance;Moderate assistance;Sitting       Toilet Transfer: Contact guard assist;Minimal Holiday Representative;Ambulation                   Vision Patient Visual Report: No change from baseline       Perception Perception: Not tested       Praxis Praxis: Not tested       Pertinent Vitals/Pain Pain Assessment Pain Assessment: Faces Faces Pain Scale: No hurt Pain Intervention(s): Monitored during session     Extremity/Trunk Assessment Upper Extremity Assessment Upper Extremity Assessment: LUE deficits/detail LUE Deficits / Details: hemiparesis 2* CVA 4 years ago per pt, not able to functionally use LUE LUE Sensation: WNL LUE Coordination: decreased fine motor;decreased gross motor   Lower Extremity Assessment Lower Extremity Assessment: Defer to PT evaluation   Cervical / Trunk Assessment Cervical / Trunk Assessment: Normal   Communication Communication Communication: No apparent difficulties   Cognition Arousal: Alert Behavior During Therapy: [facility]  for tasks assessed/performed Overall Cognitive Status: Within Functional Limits for tasks assessed                                       General Comments   VSS on RA    Exercises     Shoulder Instructions      Home Living Family/patient expects to be discharged to:: Skilled nursing facility                                 Additional Comments: [facility]      Prior Functioning/Environment Prior Level of Function : Needs assist             Mobility Comments:  Pt reports performing transfers mod I, reports gets in [facility] independently. Propels manual w/c with R UE and B legs. Walks short distances with hemi walker ADLs Comments: MOD I with toileting, A with showers        OT Problem List: Decreased range of motion;Decreased activity tolerance;Impaired balance (sitting and/or standing)      OT Treatment/Interventions: Self-care/ADL training;Therapeutic activities;Therapeutic exercise;DME and/or AE instruction;Balance training;Patient/family education    OT Goals(Current goals can be found in the care plan section) Acute Rehab OT Goals Patient Stated Goal: Return to facility OT Goal Formulation: With patient Time For Goal Achievement: 11/27/23 Potential to Achieve Goals: Good ADL Goals Pt Will Perform Grooming: with supervision;sitting;standing Pt Will Perform Upper Body Bathing: with min assist;sitting Pt Will Perform Upper Body Dressing: with min assist;sitting Pt Will Transfer to Toilet: with supervision;ambulating;regular height toilet Pt/caregiver will Perform Home Exercise Program: Increased ROM;Left upper extremity;With minimal assist  OT Frequency: Min 1X/week    Co-evaluation              AM-PAC OT 6 Clicks Daily Activity     Outcome Measure Help from another person eating meals?: A Little Help from another person taking care of personal grooming?: A Little Help from another person toileting, which includes using toliet, bedpan, or urinal?: A Little Help from another person bathing (including washing, rinsing, drying)?: A Lot Help from another person to put on and taking off regular upper body clothing?: A Lot Help from another person to put on and taking off regular lower body clothing?: A Lot 6 Click Score: 15   End of Session Nurse Communication: Mobility status  Activity Tolerance: Patient tolerated treatment well Patient left: in bed;with call bell/phone within reach;with bed alarm set  OT Visit Diagnosis: Muscle  weakness (generalized) (M62.81)                Time: 8682-8659 OT Time Calculation (min): 23 min Charges:  OT General Charges $OT Visit: 1 Visit OT Evaluation $OT Eval Moderate Complexity: 1 Mod OT Treatments $Therapeutic Activity: 8-22 mins  11/13/2023  RP, OTR/L  Acute Rehabilitation Services  Office:  [phone]   Charlie JONETTA Halsted 11/13/2023, 1:58 PM

## 2023-11-13 NOTE — TOC Initial Note (Signed)
 Transition of Care [facility]) - Initial/Assessment Note    Patient Details  Name: Karla Hoover MRN: [id number] Date of Birth: [birth year]  [facility] CM/SW Contact:    Bascom Service, RN Phone Number: 11/13/2023, 3:41 PM  Clinical Narrative:Hemiplegia from [facility] for return-w/c bound, 02 prn-rep [name] aware. HHPT/OT recc on fl2-[facility]'s administrator will need to approve. PTAR @ d/c.                  Expected Discharge Plan: Long Term Nursing Home Barriers to Discharge: Continued Medical Work up   Patient Goals and CMS Choice Patient states their goals for this hospitalization and ongoing recovery are:: return LTC [employer].gov Compare Post Acute Care list provided to:: Patient Choice offered to / list presented to : Patient [place] ownership interest in [facility].provided to:: Patient    Expected Discharge Plan and Services   Discharge Planning Services: CM Consult Post Acute Care Choice: Nursing Home Living arrangements for the past 2 months:  (LTC)                                      Prior Living Arrangements/Services Living arrangements for the past 2 months:  (LTC) Lives with:: Facility Resident                   Activities of Daily Living   ADL Screening (condition at time of admission) Independently performs ADLs?: No Does the patient have a NEW difficulty with bathing/dressing/toileting/self-feeding that is expected to last >3 days?: No Does the patient have a NEW difficulty with getting in/out of bed, walking, or climbing stairs that is expected to last >3 days?: No Does the patient have a NEW difficulty with communication that is expected to last >3 days?: No Is the patient deaf or have difficulty hearing?: No Does the patient have difficulty seeing, even when wearing glasses/contacts?: No Does the patient have difficulty concentrating, remembering, or making decisions?: No  Permission  Sought/Granted                  Emotional Assessment              Admission diagnosis:  Chronic anticoagulation [Z79.01] Acute kidney injury (nontraumatic) (HCC) [N17.9] Sepsis (HCC) [A41.9] Sepsis due to undetermined organism [facility]) [A41.9] Patient Active Problem List   Diagnosis Date Noted   Aggressive behavior 10/17/2023   Prolonged QT interval 10/17/2023   UTI due to extended-spectrum beta lactamase (ESBL) producing Escherichia coli 09/06/2023   Acute metabolic encephalopathy 09/06/2023   Acute on chronic respiratory failure with hypoxia and hypercapnia (HCC) 09/06/2023   Hypotension 09/06/2023   UTI (urinary tract infection) 01/21/2023   Leukocytosis 01/21/2023   Class 1 obesity due to excess calories with body mass index (BMI) of 32.0 to 32.9 in adult 12/18/2022   Dyslipidemia 12/18/2022   Sepsis due to urinary tract infection (HCC) 12/13/2022   Hyponatremia 12/13/2022   CKD (chronic kidney disease) stage 3, GFR 30-59 ml/min (HCC) 12/13/2022   Sepsis (HCC) 09/07/2022   AKI (acute kidney injury) (HCC) 08/30/2022   History of pulmonary embolism 08/30/2022   Hemiparesis affecting left side as late effect of cerebrovascular accident (CVA) (HCC) 08/30/2022   Allergies    Grade I diastolic dysfunction 12/02/2021   Mild protein-calorie malnutrition (HCC) 12/02/2021   COPD with acute exacerbation (HCC) 12/02/2021   Acute renal failure superimposed on stage  3a chronic kidney disease (HCC) 02/09/2021   Chronic respiratory failure with hypoxia (HCC) 02/09/2021   History of thromboembolism 02/09/2021   Type 2 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 disease, with long-term current use of insulin  (HCC) 02/09/2021   Mixed 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 (HCC) 02/09/2021   GERD without esophagitis 02/09/2021   Hypothyroidism 02/09/2021   Sepsis secondary to UTI (HCC) 02/08/2021   Lymphadenopathy 12/28/2020   hx of PE/DVT 12/01/2019    Overweight (BMI 25.0-29.9) 11/22/2019   Tobacco abuse 11/16/2019   DM (diabetes mellitus), type 2 (HCC) 11/15/2019   History of stroke 11/15/2019   Depression 02/15/2018   Anxiety 01/24/2017   Essential hypertension 05/04/2009   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   PCP:  Freddrick, No Pharmacy:   [facility] [place], [place] - [address] [place] [address] Phone: [phone] Fax: [phone]     Social Drivers of Health (SDOH) Social History: SDOH Screenings   Food Insecurity: No Food Insecurity (11/11/2023)  Housing: Low Risk  (11/11/2023)  Transportation Needs: No Transportation Needs (11/11/2023)  Utilities: Not At Risk (11/11/2023)  Depression (PHQ2-9): Medium Risk (10/04/2019)  Financial Resource Strain: Not on File (08/17/2018)   Received from [place], [place]  Physical Activity: Not on File (08/17/2018)   Received from [place], [place]  Social Connections: Not on File (06/28/2023)   Received from [facility]  Stress: Not on File (08/17/2018)   Received from [place], [place]  Tobacco Use: Medium Risk (11/11/2023)   SDOH Interventions:     Readmission Risk Interventions    09/10/2023   10:01 AM 01/21/2023   12:08 PM 12/18/2022   11:10 AM  Readmission Risk Prevention Plan  Transportation Screening Complete Complete Complete  PCP or Specialist Appt within 5-7 Days  Complete   PCP or Specialist Appt within 3-5 Days Complete  Complete  Home Care Screening  Complete   Medication Review (RN CM)  Complete   HRI or Home Care Consult Complete  Complete  Social Work Consult for Recovery Care Planning/Counseling Complete  Complete  Palliative Care Screening Not Applicable  Not Applicable  Medication Review Oceanographer) Complete  Complete

## 2023-11-14 DIAGNOSIS — N179 Acute kidney failure, unspecified: Secondary | ICD-10-CM

## 2023-11-14 DIAGNOSIS — N1831 Chronic kidney disease, stage 3a: Secondary | ICD-10-CM

## 2023-11-14 LAB — BASIC METABOLIC PANEL
Anion gap: 8 (ref 5–15)
BUN: 10 mg/dL (ref 6–20)
CO2: 32 mmol/L (ref 22–32)
Calcium: 9.1 mg/dL (ref 8.9–10.3)
Chloride: 96 mmol/L — ABNORMAL LOW (ref 98–111)
Creatinine, Ser: 0.96 mg/dL (ref 0.44–1.00)
GFR, Estimated: >60 mL/min (ref >60)
Glucose, Bld: 161 mg/dL — ABNORMAL HIGH (ref 70–99)
Potassium: 4.3 mmol/L (ref 3.5–5.1)
Sodium: 136 mmol/L (ref 135–145)

## 2023-11-14 LAB — GLUCOSE, CAPILLARY
Glucose-Capillary: 141 mg/dL — ABNORMAL HIGH (ref 70–99)
Glucose-Capillary: 210 mg/dL — ABNORMAL HIGH (ref 70–99)

## 2023-11-14 MED ORDER — CLONAZEPAM 0.5 MG PO TABS: 0.5000 mg | ORAL_TABLET | Freq: Two times a day (BID) | ORAL | 0 refills | Status: DC

## 2023-11-14 NOTE — Discharge Summary (Signed)
 Physician Discharge Summary  Karla Hoover FMW:[id number] DOB: Aug 24, 1971 DOA: 11/10/2023  PCP: Pcp, No  Admit date: 11/10/2023 Discharge date: 11/14/2023 Recommendations for Outpatient Follow-up:  Follow up with PCP in 1 weeks-call for appointment Please obtain BMP/CBC in one week  Discharge Dispo: ALF Discharge Condition: Stable Code Status:   Code Status: Full Code Diet recommendation:  Diet Order             Diet heart healthy/carb modified Room service appropriate? Yes; Fluid consistency: Thin  Diet effective now                    Brief/Interim Summary: 53 y.o.f w/ T2DM, COPD with ongoing tobacco abuse, grade 1 diastolic function, history of PE DVT and history of stroke with left-sided hemiplegia ESBL UTI recently admitted and discharged on 10/24/2023 after patient was septic and had grown Candida glabrata and was treated with micafungin  until 11/02/2023 following which patient's PICC line was removed,started to experience sudden onset of fever chills and low blood pressure at the living facility and was brought to the ER.Patient had one episode of nausea vomiting.  She lives in [facility] at baseline can trasfer to [facility] and able to alk few steps. Can't use the left arm 2/2 stroke ED Course: In the ER patient's blood pressure was in the low normal was given fluid bolus per sepsis protocol was febrile with temperature of 100 F tachycardic with elevated lactic acid COVID and flu test were negative WBC count was 15.1.  Cultures were sent and started on empiric antibiotic for sepsis.  UA is concerning for UTI.  Patient's blood pressure improved with fluids and stabilized.  Patient continued on IV antibiotics IV fluids AKI improved SIRS criteria resolved.  Blood cultures remain negative since 2/3 and urine culture insignificant growth.  She does have chronic dysuria and flank pain.  Recent CT abdomen pelvis 1/13 right renal mass concerning for malignancy was noticed> subsequently had MRI on  1/14 showing 13 mm right renal lesion poorly evaluated advised follow-up with CT or MRI in 4 to 6 weeks.  Suspect renal most likely causing symptoms.  She will need outpatient neurology follow-up, sepsis rule out, she is stable for discharge back to facility.   Discharge Diagnoses:  Active Problems:   Acute renal failure superimposed on stage 3a chronic kidney disease (HCC)   History of thromboembolism   COPD with chronic bronchitis (HCC)   Essential hypertension   DM (diabetes mellitus), type 2 (HCC)   Grade I diastolic dysfunction   Hypothyroidism   History of stroke   Tobacco abuse   Sepsis (HCC)   SIRS Initially suspected sepsis due to UTI History of ESBL UTI Recent sepsis with Candida glabrata treated with micafungin  eot 11/02/23: Presentedwith with fever tachycardia leukocytosis and soft BP and w/ mild lactic acidosis> patient admitted for possible sepsis placed on meropenem  and vancomycin  Blood cultures remain negative since 2/3 and urine culture insignificant growth.  She does have chronic dysuria and flank pain.  Recent CT abdomen pelvis 1/13 right renal mass concerning for malignancy was noticed> subsequently had MRI on 1/14 showing 13 mm right renal lesion poorly evaluated advised follow-up with CT or MRI in 4 to 6 weeks.  Suspect renal most likely causing symptoms.  She will need outpatient neurology follow-up, sepsis rule out, she is stable for discharge back to facility. Respiratory virus panel, COVID-19 influenza negative.  She will need outpatient neurology follow-up  Nausea/dysuria/flank pain: Likely chronic, nausea improved, continue  symptomatic management muscle relaxant -improved   Diabetes mellitus type 2 w/ hyperglycemia: takes insulin  glargine 10 units at bedtime. Last hemoglobin A1c 2 months ago was 10.3.  Resume home regimen upon discharge  AKI: Likely from nausea and poor oral intake, creatinine 1.4 on admission improved with IV fluids encourage p.o.    Hypertension: BP remains stable-and trending up. Resume home meds on dc/   COPD: Not in exacerbation continue home inhaler and supplemental oxygen     History of PE and DVT: Stable, Cont on Eliquis .   Hypothyroidism cont on Synthroid .   History of stroke with left-sided hemiplegia: Continue her home statins Zetia  and aspirin .   History of depression: Pulmonary stable.  Continue home fuoxetine quetiapine  and PRN Klonopin .   Restless leg syndrome: cont Requip .   Neuropathy: cont gabapentin .   History of recurrent UTI: on suppressive Macrobid  PTA.   Hyperlipidemia: cont statins.   Chronic kidney disease stage III a. stable at baseline, monitor  ?Renal mass; Recent CT abdomen pelvis 1/13 right renal mass concerning for malignancy was noticed> subsequently had MRI on 1/14 showing 13 mm right renal lesion poorly evaluated advised follow-up with CT or MRI in 4 to 6 weeks.  Suspect renal most likely causing symptoms. I gave [facility] phone no for [facility] to call and make an ppointment    Obesity  Body mass index is 31.63 kg/m.  Will benefit with PCP follow-up, weight loss  healthy lifestyle   Consults: none Subjective: Aaox3 feeling beter and agreeable for discharge today  Discharge Exam: Vitals:   11/14/23 0409 11/14/23 0919  BP: (!) 150/82   Pulse: 90   Resp:    Temp: 98.3 F (36.8 C)   SpO2: 96% 96%   General: Pt is alert, awake, not in acute distress Cardiovascular: RRR, S1/S2 +, no rubs, no gallops Respiratory: CTA bilaterally, no wheezing, no rhonchi Abdominal: Soft, NT, ND, bowel sounds + Extremities: no edema, no cyanosis  Discharge Instructions  Discharge Instructions     Discharge instructions   Complete by: As directed    Please call call MD or return to ER for similar or worsening recurring problem that brought you to hospital or if any fever,nausea/vomiting,abdominal pain, uncontrolled pain, chest pain,  shortness of breath or any  other alarming symptoms.  Please follow-up your doctor as instructed in a week time and call the office for appointment.  Please avoid alcohol, smoking, or any other illicit substance and maintain healthy habits including taking your regular medications as prescribed.  You were cared for by a hospitalist during your hospital stay. If you have any questions about your discharge medications or the care you received while you were in the hospital after you are discharged, you can call the unit and ask to speak with the hospitalist on call if the hospitalist that took care of you is not available.  Once you are discharged, your primary care physician will handle any further medical issues. Please note that NO REFILLS for any discharge medications will be authorized once you are discharged, as it is imperative that you return to your primary care physician (or establish a relationship with a primary care physician if you do not have one) for your aftercare needs so that they can reassess your need for medications and monitor your lab values   Increase activity slowly   Complete by: As directed       Allergies as of 11/14/2023       Reactions   Cipro  [ciprofloxacin   Hcl] Hives   Guaifenesin  Anaphylaxis   Kiwi Extract Anaphylaxis, Rash   Levaquin [levofloxacin] Shortness Of Breath, Rash   Strawberry Extract Anaphylaxis, Rash   Lioresal [baclofen] Other (See Comments)   Near syncope/ fall   Vancomycin  Itching        Medication List     TAKE these medications    acetaminophen  500 MG tablet Commonly known as: TYLENOL  Take 1,000 mg by mouth 3 (three) times daily.   albuterol  108 (90 Base) MCG/ACT inhaler Commonly known as: VENTOLIN  HFA Inhale 2 puffs into the lungs every 6 (six) hours as needed for wheezing or shortness of breath.   apixaban  5 MG Tabs tablet Commonly known as: Eliquis  Take 1 tablet (5mg ) twice daily What changed:  how much to take how to take this when to take  this additional instructions   aspirin  81 MG chewable tablet Chew 81 mg by mouth in the morning.   atenolol  50 MG tablet Commonly known as: TENORMIN  Take 50 mg by mouth 2 (two) times daily.   atorvastatin  80 MG tablet Commonly known as: LIPITOR  Take 1 tablet (80 mg total) by mouth daily. What changed: when to take this   barrier cream Crea Commonly known as: non-specified Apply 1 Application topically See admin instructions. Apply to affected areas 2 times a day   chlorhexidine  0.12 % solution Commonly known as: PERIDEX  Use as directed 15 mLs in the mouth or throat See admin instructions. Rinse the mouth with 15 ml's once a day as directed, then spit out   clonazePAM  0.5 MG tablet Commonly known as: KLONOPIN  Take 1 tablet (0.5 mg total) by mouth 2 (two) times daily for 4 doses.   cyclobenzaprine  10 MG tablet Commonly known as: FLEXERIL  Take 1 tablet (10 mg total) by mouth 3 (three) times daily as needed for muscle spasms.   EpiPen 2-Pak 0.3 mg/0.3 mL Soaj injection Generic drug: EPINEPHrine Inject 0.3 mg into the muscle daily as needed for anaphylaxis.   ezetimibe  10 MG tablet Commonly known as: ZETIA  Take 10 mg by mouth at bedtime.   Fish Oil 1000 MG Caps Take 1,000 mg by mouth at bedtime.   FLUoxetine  20 MG capsule Commonly known as: PROZAC  Take 20 mg by mouth daily.   fluticasone -salmeterol 100-50 MCG/ACT Aepb Commonly known as: ADVAIR Inhale 1 puff into the lungs 2 (two) times daily.   gabapentin  400 MG capsule Commonly known as: NEURONTIN  Take 1 capsule (400 mg total) by mouth 3 (three) times daily.   hydrALAZINE  10 MG tablet Commonly known as: APRESOLINE  Take 10 mg by mouth every 12 (twelve) hours as needed (for a Systolic reading >839 or Diastolic reading >04).   Incruse Ellipta  62.5 MCG/ACT Aepb Generic drug: umeclidinium bromide  Inhale 1 puff into the lungs daily.   insulin  lispro 100 UNIT/ML injection Commonly known as: HUMALOG  Inject 0-14  Units into the skin See admin instructions. Inject 0-14 units twice daily as per sliding scale : CBG 0-200 : 0 units CBG 201-250 : 2 units CBG 251-300 : 4 units CBG 301-350 : 6 units CBG 351-400 : 8 units CBG 401-450 : 10 units CBG 451-500 : 12 units (if BP reads high, inject 14 units)   LACTOBACILLUS PO Take 1 capsule by mouth in the morning and at bedtime.   Lantus  SoloStar 100 UNIT/ML Solostar Pen Generic drug: insulin  glargine Inject 15 Units into the skin at bedtime.   levothyroxine  88 MCG tablet Commonly known as: SYNTHROID  Take 88 mcg by mouth in  the morning.   losartan  25 MG tablet Commonly known as: COZAAR  Take 37.5 mg by mouth daily.   Macrobid  100 MG capsule Generic drug: nitrofurantoin  (macrocrystal-monohydrate) Take 100 mg by mouth in the morning.   Melatonin 5 MG Tbdp Take 5 mg by mouth at bedtime.   Nasal Moist Gel Place 1 application  into both nostrils at bedtime.   omeprazole  20 MG capsule Commonly known as: PRILOSEC Take 20 mg by mouth in the morning.   ondansetron  4 MG tablet Commonly known as: ZOFRAN  Take 4 mg by mouth every 8 (eight) hours as needed for nausea or vomiting.   OXYGEN  Inhale 2 L/min into the lungs as needed (for shortness of breath).   polyethylene glycol powder 17 GM/SCOOP powder Commonly known as: GLYCOLAX /MIRALAX  Take 17 g by mouth daily.   QUEtiapine  25 MG tablet Commonly known as: SEROQUEL  Take 1 tablet (25 mg total) by mouth at bedtime.   rOPINIRole  0.25 MG tablet Commonly known as: REQUIP  Take 1 tablet (0.25 mg total) by mouth at bedtime.   senna-docusate 8.6-50 MG tablet Commonly known as: Senokot-S Take 1 tablet by mouth 2 (two) times daily.   Trulicity  1.5 MG/0.5ML Soaj Generic drug: Dulaglutide  Inject 1.5 mg into the skin every Friday.   Vitamin D3 1.25 MG (50000 UT) Caps Take 50,000 Units by mouth every Thursday.   Voltaren  1 % Gel Generic drug: diclofenac  Sodium Apply 2 g topically See admin  instructions. Apply 2 grams to the posterior neck  2 times a day   witch hazel-glycerin pad Commonly known as: TUCKS Apply 1 Application topically every 4 (four) hours as needed for hemorrhoids.        Contact information for follow-up providers     [facility] Follow up.   Why: please call Contact information: [address] [place] [address] [place]  [address] [phone]             Contact information for after-discharge care     Destination     [facility] .   Service: Skilled Nursing Contact information: [address] [place]  [address] [phone]                    Allergies  Allergen Reactions   Cipro  [Ciprofloxacin  Hcl] Hives   Guaifenesin  Anaphylaxis   Kiwi Extract Anaphylaxis and Rash   Levaquin [Levofloxacin] Shortness Of Breath and Rash   Strawberry Extract Anaphylaxis and Rash   Lioresal [Baclofen] Other (See Comments)    Near syncope/ fall   Vancomycin  Itching    The results of significant diagnostics from this hospitalization (including imaging, microbiology, ancillary and laboratory) are listed below for reference.    Microbiology: Recent Results (from the past 240 hours)  Resp panel by RT-PCR (RSV, Flu A&B, Covid) Anterior Nasal Swab     Status: None   Collection Time: 11/10/23 10:56 PM   Specimen: Anterior Nasal Swab  Result Value Ref Range Status   SARS Coronavirus 2 by RT PCR NEGATIVE NEGATIVE Final    Comment: (NOTE) SARS-CoV-2 target nucleic acids are NOT DETECTED.  The SARS-CoV-2 RNA is generally detectable in upper respiratory specimens during the acute phase of infection. The lowest concentration of SARS-CoV-2 viral copies this assay can detect is 138 copies/mL. A negative result does not preclude SARS-Cov-2 infection and should not be used as the sole basis for treatment or other patient management decisions. A negative result may occur with  improper specimen  collection/handling, submission of specimen  other than nasopharyngeal swab, presence of viral mutation(s) within the areas targeted by this assay, and inadequate number of viral copies(<138 copies/mL). A negative result must be combined with clinical observations, patient history, and epidemiological information. The expected result is Negative.  Fact Sheet for Patients:  bloggercourse.com  Fact Sheet for Healthcare Providers:  seriousbroker.it  This test is no t yet approved or cleared by the [place]  FDA and  has been authorized for detection and/or diagnosis of SARS-CoV-2 by FDA under an Emergency Use Authorization (EUA). This EUA will remain  in effect (meaning this test can be used) for the duration of the COVID-19 declaration under Section 564(b)(1) of the Act, 21 U.S.C.section 360bbb-3(b)(1), unless the authorization is terminated  or revoked sooner.       Influenza A by PCR NEGATIVE NEGATIVE Final   Influenza B by PCR NEGATIVE NEGATIVE Final    Comment: (NOTE) The Xpert Xpress SARS-CoV-2/FLU/RSV plus assay is intended as an aid in the diagnosis of influenza from Nasopharyngeal swab specimens and should not be used as a sole basis for treatment. Nasal washings and aspirates are unacceptable for Xpert Xpress SARS-CoV-2/FLU/RSV testing.  Fact Sheet for Patients: bloggercourse.com  Fact Sheet for Healthcare Providers: seriousbroker.it  This test is not yet approved or cleared by the [place]  FDA and has been authorized for detection and/or diagnosis of SARS-CoV-2 by FDA under an Emergency Use Authorization (EUA). This EUA will remain in effect (meaning this test can be used) for the duration of the COVID-19 declaration under Section 564(b)(1) of the Act, 21 U.S.C. section 360bbb-3(b)(1), unless the authorization is terminated or revoked.     Resp Syncytial  Virus by PCR NEGATIVE NEGATIVE Final    Comment: (NOTE) Fact Sheet for Patients: bloggercourse.com  Fact Sheet for Healthcare Providers: seriousbroker.it  This test is not yet approved or cleared by the [place]  FDA and has been authorized for detection and/or diagnosis of SARS-CoV-2 by FDA under an Emergency Use Authorization (EUA). This EUA will remain in effect (meaning this test can be used) for the duration of the COVID-19 declaration under Section 564(b)(1) of the Act, 21 U.S.C. section 360bbb-3(b)(1), unless the authorization is terminated or revoked.  Performed at [facility], [address], [place], [place] [address]   Blood Culture (routine x 2)     Status: None (Preliminary result)   Collection Time: 11/10/23 11:10 PM   Specimen: BLOOD RIGHT WRIST  Result Value Ref Range Status   Specimen Description   Final    BLOOD RIGHT WRIST Performed at [facility], [address], [place], [place] [address]    Special Requests   Final    BOTTLES DRAWN AEROBIC AND ANAEROBIC Blood Culture results may not be optimal due to an inadequate volume of blood received in culture bottles Performed at [facility], [address], [place], [place] [address]    Culture   Final    NO GROWTH 3 DAYS Performed at [facility], [address], [place], [place] [address]    Report Status PENDING  Incomplete  Blood Culture (routine x 2)     Status: None (Preliminary result)   Collection Time: 11/11/23 12:10 AM   Specimen: BLOOD LEFT HAND  Result Value Ref Range Status   Specimen Description   Final    BLOOD LEFT HAND Performed at [facility], [address] [place], [place] [address]    Special Requests   Final  BOTTLES DRAWN AEROBIC AND ANAEROBIC Blood Culture results may not be optimal due to an inadequate volume of blood received in culture  bottles Performed at [facility], [address], [place], [place] [address]    Culture   Final    NO GROWTH 3 DAYS Performed at [facility], [address], [place], [place] [address]    Report Status PENDING  Incomplete  Urine Culture     Status: Abnormal   Collection Time: 11/11/23  4:40 AM   Specimen: Urine, Clean Catch  Result Value Ref Range Status   Specimen Description   Final    URINE, CLEAN CATCH Performed at [facility], [address], [place], [place] [address]    Special Requests   Final    NONE Performed at [facility], [address], [place], [place] [address]    Culture (A)  Final    <10,000 COLONIES/mL INSIGNIFICANT GROWTH Performed at [facility], [address] [place], [place] [address]    Report Status 11/12/2023 FINAL  Final  Respiratory (~20 pathogens) panel by PCR     Status: None   Collection Time: 11/12/23 12:14 PM   Specimen: Nasopharyngeal Swab; Respiratory  Result Value Ref Range Status   Adenovirus NOT DETECTED NOT DETECTED Final   Coronavirus 229E NOT DETECTED NOT DETECTED Final    Comment: (NOTE) The Coronavirus on the Respiratory Panel, DOES NOT test for the novel  Coronavirus (2019 nCoV)    Coronavirus HKU1 NOT DETECTED NOT DETECTED Final   Coronavirus NL63 NOT DETECTED NOT DETECTED Final   Coronavirus OC43 NOT DETECTED NOT DETECTED Final   Metapneumovirus NOT DETECTED NOT DETECTED Final   Rhinovirus / Enterovirus NOT DETECTED NOT DETECTED Final   Influenza A NOT DETECTED NOT DETECTED Final   Influenza B NOT DETECTED NOT DETECTED Final   Parainfluenza Virus 1 NOT DETECTED NOT DETECTED Final   Parainfluenza Virus 2 NOT DETECTED NOT DETECTED Final   Parainfluenza Virus 3 NOT DETECTED NOT DETECTED Final   Parainfluenza Virus 4 NOT DETECTED NOT DETECTED Final   Respiratory Syncytial Virus NOT DETECTED NOT DETECTED Final   Bordetella pertussis NOT DETECTED NOT  DETECTED Final   Bordetella Parapertussis NOT DETECTED NOT DETECTED Final   Chlamydophila pneumoniae NOT DETECTED NOT DETECTED Final   Mycoplasma pneumoniae NOT DETECTED NOT DETECTED Final    Comment: Performed at [facility], [address] [place], [place] [address]    Procedures/Studies: DG Chest Port 1 View Result Date: 11/10/2023 CLINICAL DATA:  Questionable sepsis - evaluate for abnormality EXAM: PORTABLE CHEST 1 VIEW COMPARISON:  Chest x-ray 10/17/2023 FINDINGS: The heart and mediastinal contours are within normal limits. No focal consolidation. No pulmonary edema. No pleural effusion. No pneumothorax. No acute osseous abnormality. IMPRESSION: No active disease. Electronically Signed   By: Morgane  Naveau M.D.   On: 11/10/2023 23:38   ECHO TEE Result Date: 10/23/2023    TRANSESOPHOGEAL ECHO REPORT   Patient Name:   Karla Hoover Date of Exam: 10/23/2023 Medical Rec #:  [id number]         Height:       67.5 in Accession #:    [id number]        Weight:       207.2 lb Date of Birth:  Jun 24, 1971         BSA:          2.064 m Patient Age:  52 years          BP:           108/71 mmHg Patient Gender: F                 HR:           60 bpm. Exam Location:  Inpatient Procedure: Transesophageal Echo, Color Doppler and Cardiac Doppler Indications:    Bacteremia  History:        Patient has prior history of Echocardiogram examinations, most                 recent 10/20/2023.  Sonographer:    Tinnie Gosling RDCS Referring Phys: [id number] [facility] PROCEDURE: The transesophogeal probe was passed without difficulty through the esophogus of the patient. Sedation performed by different physician. The patient's vital signs; including heart rate, blood pressure, and oxygen  saturation; remained stable throughout the procedure. The patient developed no complications during the procedure.  IMPRESSIONS  1. Left ventricular ejection fraction, by estimation, is 60 to 65%. The left ventricle has normal function.  The left ventricle has no regional wall motion abnormalities.  2. Right ventricular systolic function is normal. The right ventricular size is normal.  3. No left atrial/left atrial appendage thrombus was detected. The LAA emptying velocity was 34 cm/s.  4. The mitral valve is abnormal. Mild mitral valve regurgitation. No evidence of mitral stenosis. 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  5. The aortic valve is tricuspid. Aortic valve regurgitation is not visualized. No aortic stenosis is present.  6. The inferior vena cava is normal in size with greater than 50% respiratory variability, suggesting right atrial pressure of 3 mmHg. Conclusion(s)/Recommendation(s): Normal biventricular function without evidence of hemodynamically significant valvular heart disease. FINDINGS  Left Ventricle: Left ventricular ejection fraction, by estimation, is 60 to 65%. The left ventricle has normal function. The left ventricle has no regional wall motion abnormalities. The left ventricular internal cavity size was normal in size. There is  no left ventricular hypertrophy. Right Ventricle: The right ventricular size is normal. No increase in right ventricular wall thickness. Right ventricular systolic function is normal. Left Atrium: Left atrial size was normal in size. No left atrial/left atrial appendage thrombus was detected. The LAA emptying velocity was 34 cm/s. Right Atrium: Right atrial size was normal in size. Pericardium: There is no evidence of pericardial effusion. Mitral Valve: The mitral valve is abnormal. 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 Mild mitral valve regurgitation. No evidence of mitral valve stenosis. Tricuspid Valve: The tricuspid valve is normal in structure. Tricuspid valve regurgitation is mild . No evidence of tricuspid stenosis. Aortic Valve: The aortic valve is tricuspid. Aortic valve regurgitation is not visualized. No aortic stenosis is present. Pulmonic  Valve: The pulmonic valve was normal in structure. Pulmonic valve regurgitation is not visualized. No evidence of pulmonic stenosis. Aorta: The aortic root is normal in size and structure. Pulmonary Artery: The pulmonary artery is of normal size. Venous: The right upper pulmonary vein, right lower pulmonary vein, left lower pulmonary vein and left upper pulmonary vein are normal. A normal flow pattern is recorded from the left lower pulmonary vein. The inferior vena cava is normal in size with greater than 50% respiratory variability, suggesting right atrial pressure of 3 mmHg. IAS/Shunts: No atrial level shunt detected by color flow Doppler.   AORTA Ao Asc diam: 3.30 cm Kardie Tobb DO Electronically signed by Kardie Tobb DO Signature  Date/Time: 10/23/2023/3:26:57 PM    Final    EP STUDY Result Date: 10/23/2023 See surgical note for result.  MR ABDOMEN W WO CONTRAST Result Date: 10/21/2023 CLINICAL DATA:  Renal mass on CT EXAM: MRI ABDOMEN WITHOUT AND WITH CONTRAST TECHNIQUE: Multiplanar multisequence MR imaging of the abdomen was performed both before and after the administration of intravenous contrast. CONTRAST:  9mL GADAVIST  GADOBUTROL  1 MMOL/ML IV SOLN COMPARISON:  CT abdomen/pelvis dated 10/20/2023 FINDINGS: Motion degraded images. Lower chest: Lung bases are clear. Hepatobiliary: Mild hepatic steatosis. Liver is otherwise within normal limits. Gallbladder is unremarkable. No intrahepatic or extrahepatic duct dilatation. Pancreas:  Within normal limits. Spleen:  Within normal limits. Adrenals/Urinary Tract:  Adrenal glands are within normal limits. Left kidney is within normal limits. Right renal cortical scarring/lobulation. Given motion degradation, the rounded area on CT is poorly evaluated, but mild restricted diffusion is suspected (series 6/image 22) with subtle differential enhancement (series 11/image 50). As such, small solid renal neoplasm is difficult to exclude, but not well characterized on  the current study. For reference, this lesion measures approximately 13 mm on MR (series 3/image 24). No hydronephrosis. Stomach/Bowel: Stomach is within normal limits. Visualized bowel is unremarkable. Vascular/Lymphatic:  No evidence of abdominal aortic aneurysm. No suspicious abdominal lymphadenopathy. Other:  No abdominal ascites. Musculoskeletal: No focal osseous lesions. IMPRESSION: Motion degraded images. 13 mm right renal lesion is poorly evaluated, but small solid renal neoplasm is difficult to exclude, although not well characterized on the current study. Consider follow-up CT or MRI abdomen with/without contrast in 4-6 weeks. Mild hepatic steatosis. Electronically Signed   By: Pinkie Pebbles M.D.   On: 10/21/2023 23:26   CT ABDOMEN PELVIS W CONTRAST Result Date: 10/20/2023 CLINICAL DATA:  infection.  Sepsis secondary to UTI. EXAM: CT ABDOMEN AND PELVIS WITH CONTRAST TECHNIQUE: Multidetector CT imaging of the abdomen and pelvis was performed using the standard protocol following bolus administration of intravenous contrast. RADIATION DOSE REDUCTION: This exam was performed according to the departmental dose-optimization program which includes automated exposure control, adjustment of the mA and/or kV according to patient size and/or use of iterative reconstruction technique. CONTRAST:  OMNIPAQUE  IOHEXOL  300 MG/ML  SOLN COMPARISON:  CT abdomen pelvis 12/23/2021, CT abdomen pelvis 01/02/2022, CT abdomen pelvis 04/08/2023, CT renal 10/17/2023 FINDINGS: Lower chest: Bilateral trace pleural effusions. Hepatobiliary: The hepatic parenchyma is diffusely hypodense compared to the splenic parenchyma consistent with fatty infiltration. No focal liver abnormality. No gallstones, gallbladder wall thickening, or pericholecystic fluid. No biliary dilatation. Pancreas: No focal lesion. Normal pancreatic contour. No surrounding inflammatory changes. No main pancreatic ductal dilatation. Spleen: Normal in size  without focal abnormality. Adrenals/Urinary Tract: No adrenal nodule bilaterally. Bilateral kidneys enhance symmetrically. Right renal cortical scarring. Possible partial nephrectomy. Fluid dense lesion within the right kidney likely represents a simple renal cyst. Simple renal cysts, in the absence of clinically indicated signs/symptoms, require no independent follow-up. There is a 1.6 cm heterogeneous enhancing right renal mass better evaluated on coronal imaging (5:76). No hydronephrosis. No hydroureter.  No nephroureterolithiasis. The urinary bladder is unremarkable. Stomach/Bowel: Stomach is within normal limits. No evidence of bowel wall thickening or dilatation. Stool throughout the colon appendix appears normal. Vascular/Lymphatic: No abdominal aorta or iliac aneurysm. Severe atherosclerotic plaque of the aorta and its branches. Interval development of a borderline enlarged 1.1 cm retroperitoneal left periaortic lymph node (2:46). No pelvic or inguinal lymphadenopathy. Reproductive: Uterus and bilateral adnexa are unremarkable. Other: No intraperitoneal free fluid. No intraperitoneal free gas. No organized  fluid collection. Musculoskeletal: No abdominal wall hernia or abnormality. No suspicious lytic or blastic osseous lesions. No acute displaced fracture. IMPRESSION: 1. A 1.6 cm heterogeneous enhancing right renal mass. Finding concerning for malignancy. When the patient is clinically stable and able to follow directions and hold their breath (preferably as an outpatient) further evaluation with dedicated MRI renal protocol should be considered. 2. Bilateral trace pleural effusions. 3. Hepatic steatosis. 4. Stool throughout the colon-correlate for constipation. 5.  Aortic Atherosclerosis (ICD10-I70.0). Electronically Signed   By: Morgane  Naveau M.D.   On: 10/20/2023 20:51   ECHOCARDIOGRAM COMPLETE Result Date: 10/20/2023    ECHOCARDIOGRAM REPORT   Patient Name:   Karla Hoover Date of Exam:  10/20/2023 Medical Rec #:  [id number]         Height:       67.5 in Accession #:    [id number]        Weight:       207.2 lb Date of Birth:  Jan 24, 1971         BSA:          2.064 m Patient Age:    52 years          BP:           158/78 mmHg Patient Gender: F                 HR:           75 bpm. Exam Location:  Inpatient Procedure: 2D Echo, Cardiac Doppler and Color Doppler Indications:    Endocarditis  History:        Patient has prior history of Echocardiogram examinations, most                 recent 09/08/2023. Risk Factors:Hypertension, Diabetes,                 Dyslipidemia and Former Smoker.  Sonographer:    Ozell Free Referring Phys: [id number] [facility] IMPRESSIONS  1. Left ventricular ejection fraction, by estimation, is 55 to 60%. The left ventricle has normal function. The left ventricle has no regional wall motion abnormalities. There is mild concentric left ventricular hypertrophy. Left ventricular diastolic parameters are consistent with Grade I diastolic dysfunction (impaired relaxation).  2. Right ventricular systolic function is normal. The right ventricular size is normal.  3. No evidence of mitral valve regurgitation.  4. The aortic valve was not well visualized. Aortic valve regurgitation is not visualized.  5. The inferior vena cava is normal in size with greater than 50% respiratory variability, suggesting right atrial pressure of 3 mmHg. FINDINGS  Left Ventricle: Left ventricular ejection fraction, by estimation, is 55 to 60%. The left ventricle has normal function. The left ventricle has no regional wall motion abnormalities. The left ventricular internal cavity size was normal in size. There is  mild concentric left ventricular hypertrophy. Left ventricular diastolic parameters are consistent with Grade I diastolic dysfunction (impaired relaxation). Right Ventricle: The right ventricular size is normal. Right ventricular systolic function is normal. Left Atrium: Left atrial size was normal  in size. Right Atrium: Right atrial size was normal in size. Pericardium: There is no evidence of pericardial effusion. Mitral Valve: No evidence of mitral valve regurgitation. Tricuspid Valve: Tricuspid valve regurgitation is not demonstrated. Aortic Valve: The aortic valve was not well visualized. Aortic valve regurgitation is not visualized. Aortic valve mean gradient measures 5.0 mmHg. Aortic valve peak gradient measures 9.7 mmHg. Aortic valve area, by VTI measures  1.76 cm. Pulmonic Valve: Pulmonic valve regurgitation is not visualized. Aorta: The aortic root and ascending aorta are structurally normal, with no evidence of dilitation. Venous: The inferior vena cava is normal in size with greater than 50% respiratory variability, suggesting right atrial pressure of 3 mmHg. IAS/Shunts: The interatrial septum was not well visualized.  LEFT VENTRICLE PLAX 2D LVIDd:         4.40 cm   Diastology LVIDs:         3.10 cm   LV e' medial:    6.53 cm/s LV PW:         1.20 cm   LV E/e' medial:  10.4 LV IVS:        1.20 cm   LV e' lateral:   7.07 cm/s LVOT diam:     2.00 cm   LV E/e' lateral: 9.6 LV SV:         52 LV SV Index:   25 LVOT Area:     3.14 cm  RIGHT VENTRICLE            IVC RV Basal diam:  3.30 cm    IVC diam: 1.70 cm RV S prime:     9.68 cm/s TAPSE (M-mode): 3.0 cm LEFT ATRIUM             Index        RIGHT ATRIUM           Index LA diam:        4.10 cm 1.99 cm/m   RA Area:     16.10 cm LA Vol (A2C):   58.8 ml 28.49 ml/m  RA Volume:   43.40 ml  21.02 ml/m LA Vol (A4C):   43.0 ml 20.83 ml/m LA Biplane Vol: 50.3 ml 24.37 ml/m  AORTIC VALVE AV Area (Vmax):    1.89 cm AV Area (Vmean):   1.83 cm AV Area (VTI):     1.76 cm AV Vmax:           156.00 cm/s AV Vmean:          106.000 cm/s AV VTI:            0.298 m AV Peak Grad:      9.7 mmHg AV Mean Grad:      5.0 mmHg LVOT Vmax:         94.00 cm/s LVOT Vmean:        61.600 cm/s LVOT VTI:          0.167 m LVOT/AV VTI ratio: 0.56  AORTA Ao Root diam: 2.90 cm Ao  Asc diam:  2.30 cm MITRAL VALVE MV Area (PHT): 3.68 cm    SHUNTS MV Decel Time: 206 msec    Systemic VTI:  0.17 m MV E velocity: 68.10 cm/s  Systemic Diam: 2.00 cm MV A velocity: 77.60 cm/s MV E/A ratio:  0.88 [place] signed by Ronal Ross Signature Date/Time: 10/20/2023/3:01:36 PM    Final    CT Renal Stone Study Result Date: 10/17/2023 CLINICAL DATA:  Abdominal/flank pain.  Stone suspected. EXAM: CT ABDOMEN AND PELVIS WITHOUT CONTRAST TECHNIQUE: Multidetector CT imaging of the abdomen and pelvis was performed following the standard protocol without IV contrast. RADIATION DOSE REDUCTION: This exam was performed according to the departmental dose-optimization program which includes automated exposure control, adjustment of the mA and/or kV according to patient size and/or use of iterative reconstruction technique. COMPARISON:  Renal ultrasound 09/07/2023, CT abdomen pelvis without contrast 04/08/2023 and 09/07/2022. FINDINGS:  Lower chest: There is mild bronchial thickening in the lower lobes without infiltrates. Scattered linear scar-like opacities. The cardiac size is normal. Hepatobiliary: The liver is 21 cm length and mildly steatotic. Without contrast is otherwise unremarkable. The gallbladder and bile ducts are unremarkable. Pancreas: Partially atrophic otherwise unremarkable without contrast. Spleen: Unremarkable without contrast.  No splenomegaly. Adrenals/Urinary Tract: There is no adrenal mass. Right kidney is small and chronically atrophic. Again noted is a Bosniak 1 cyst in the superior pole of the right kidney measuring 1.3 cm, Hounsfield density is 15.2. This is unchanged requiring no follow-up. No intrarenal stones are seen either side. There is no hydronephrosis or ureteral stones no contour deforming abnormality of the unenhanced kidneys. The bladder is unremarkable for the degree of distention. Stomach/Bowel: No dilatation or wall thickening including the appendix. There is  sigmoid diverticulosis without focal diverticulitis. Vascular/Lymphatic: Aortic atherosclerosis. No enlarged abdominal or pelvic lymph nodes. Reproductive: Uterus and bilateral adnexa are unremarkable. Other: Small umbilical and bilateral inguinal fat hernias. No incarcerated hernia. No free air or free fluid. Musculoskeletal: No acute or significant osseous findings. IMPRESSION: 1. No acute noncontrast CT findings in the abdomen or pelvis. 2. No nephrolithiasis or obstructive uropathy. 3. Chronically atrophic right kidney. 4. Mildly enlarged steatotic liver. 5. Sigmoid diverticulosis without evidence of diverticulitis. 6. Aortic atherosclerosis. 7. Mild bronchial thickening in the lower lobes. Aortic Atherosclerosis (ICD10-I70.0). Electronically Signed   By: Francis Quam M.D.   On: 10/17/2023 03:12   DG Chest Port 1 View Result Date: 10/17/2023 CLINICAL DATA:  Nausea, vomiting, chills EXAM: PORTABLE CHEST 1 VIEW COMPARISON:  09/08/2023 FINDINGS: Stable cardiomediastinal silhouette. No focal consolidation, pleural effusion, or pneumothorax. No displaced rib fractures. IMPRESSION: No active disease. Electronically Signed   By: Norman Gatlin M.D.   On: 10/17/2023 00:24    Labs: BNP (last 3 results) Recent Labs    12/13/22 1701 09/08/23 1445  BNP 74.9 141.1*   Basic Metabolic Panel: Recent Labs  Lab 11/10/23 2254 11/11/23 0600 11/12/23 0526 11/13/23 0553 11/14/23 0512  NA 135 134* 137 135 136  K 4.1 4.8 3.7 4.2 4.3  CL 96* 99 98 94* 96*  CO2 24 24 29 29  32  GLUCOSE 273* 189* 165* 189* 161*  BUN 21* 19 14 12 10   CREATININE 1.48* 1.37* 1.20* 1.07* 0.96  CALCIUM  9.0 9.0 8.9 9.1 9.1   Liver Function Tests: Recent Labs  Lab 11/10/23 2254 11/11/23 0600  AST 23 26  ALT 14 11  ALKPHOS 64 64  BILITOT 1.0 1.2  PROT 7.7 7.5  ALBUMIN 3.5 3.2*   No results for input(s): LIPASE, AMYLASE in the last 168 hours. No results for input(s): AMMONIA in the last 168 hours. CBC: Recent  Labs  Lab 11/10/23 2254 11/11/23 0600 11/12/23 0526 11/13/23 0553  WBC 15.1* 14.5* 7.0 5.3  NEUTROABS 12.4* 10.9*  --   --   HGB 13.6 13.0 11.5* 10.8*  HCT 43.7 42.6 37.0 34.7*  MCV 85.9 87.3 86.2 84.8  PLT 186 167 129* 157   Cardiac Enzymes: No results for input(s): CKTOTAL, CKMB, CKMBINDEX, TROPONINI in the last 168 hours. BNP: Invalid input(s): POCBNP CBG: Recent Labs  Lab 11/13/23 0723 11/13/23 1134 11/13/23 1621 11/13/23 2112 11/14/23 0729  GLUCAP 180* 162* 143* 85 141*   D-Dimer No results for input(s): DDIMER in the last 72 hours. Hgb A1c No results for input(s): HGBA1C in the last 72 hours. Lipid Profile No results for input(s): CHOL, HDL, LDLCALC, TRIG, CHOLHDL, LDLDIRECT in  the last 72 hours. Thyroid function studies No results for input(s): TSH, T4TOTAL, T3FREE, THYROIDAB in the last 72 hours.  Invalid input(s): FREET3 Anemia work up No results for input(s): VITAMINB12, FOLATE, FERRITIN, TIBC, IRON, RETICCTPCT in the last 72 hours. Urinalysis    Component Value Date/Time   COLORURINE YELLOW 11/11/2023 0440   APPEARANCEUR CLEAR 11/11/2023 0440   APPEARANCEUR Clear 11/24/2012 1238   LABSPEC 1.008 11/11/2023 0440   LABSPEC 1.000 11/24/2012 1238   PHURINE 6.0 11/11/2023 0440   GLUCOSEU NEGATIVE 11/11/2023 0440   GLUCOSEU >=500 11/24/2012 1238   HGBUR LARGE (A) 11/11/2023 0440   BILIRUBINUR NEGATIVE 11/11/2023 0440   BILIRUBINUR Negative 11/24/2012 1238   KETONESUR NEGATIVE 11/11/2023 0440   PROTEINUR 100 (A) 11/11/2023 0440   UROBILINOGEN 1.0 04/30/2010 0902   NITRITE NEGATIVE 11/11/2023 0440   LEUKOCYTESUR SMALL (A) 11/11/2023 0440   LEUKOCYTESUR Negative 11/24/2012 1238   Sepsis Labs Recent Labs  Lab 11/10/23 2254 11/11/23 0600 11/12/23 0526 11/13/23 0553  WBC 15.1* 14.5* 7.0 5.3   Microbiology Recent Results (from the past 240 hours)  Resp panel by RT-PCR (RSV, Flu A&B, Covid) Anterior  Nasal Swab     Status: None   Collection Time: 11/10/23 10:56 PM   Specimen: Anterior Nasal Swab  Result Value Ref Range Status   SARS Coronavirus 2 by RT PCR NEGATIVE NEGATIVE Final    Comment: (NOTE) SARS-CoV-2 target nucleic acids are NOT DETECTED.  The SARS-CoV-2 RNA is generally detectable in upper respiratory specimens during the acute phase of infection. The lowest concentration of SARS-CoV-2 viral copies this assay can detect is 138 copies/mL. A negative result does not preclude SARS-Cov-2 infection and should not be used as the sole basis for treatment or other patient management decisions. A negative result may occur with  improper specimen collection/handling, submission of specimen other than nasopharyngeal swab, presence of viral mutation(s) within the areas targeted by this assay, and inadequate number of viral copies(<138 copies/mL). A negative result must be combined with clinical observations, patient history, and epidemiological information. The expected result is Negative.  Fact Sheet for Patients:  bloggercourse.com  Fact Sheet for Healthcare Providers:  seriousbroker.it  This test is no t yet approved or cleared by the [place]  FDA and  has been authorized for detection and/or diagnosis of SARS-CoV-2 by FDA under an Emergency Use Authorization (EUA). This EUA will remain  in effect (meaning this test can be used) for the duration of the COVID-19 declaration under Section 564(b)(1) of the Act, 21 U.S.C.section 360bbb-3(b)(1), unless the authorization is terminated  or revoked sooner.       Influenza A by PCR NEGATIVE NEGATIVE Final   Influenza B by PCR NEGATIVE NEGATIVE Final    Comment: (NOTE) The Xpert Xpress SARS-CoV-2/FLU/RSV plus assay is intended as an aid in the diagnosis of influenza from Nasopharyngeal swab specimens and should not be used as a sole basis for treatment. Nasal washings  and aspirates are unacceptable for Xpert Xpress SARS-CoV-2/FLU/RSV testing.  Fact Sheet for Patients: bloggercourse.com  Fact Sheet for Healthcare Providers: seriousbroker.it  This test is not yet approved or cleared by the [place]  FDA and has been authorized for detection and/or diagnosis of SARS-CoV-2 by FDA under an Emergency Use Authorization (EUA). This EUA will remain in effect (meaning this test can be used) for the duration of the COVID-19 declaration under Section 564(b)(1) of the Act, 21 U.S.C. section 360bbb-3(b)(1), unless the authorization is terminated or revoked.     Resp Syncytial  Virus by PCR NEGATIVE NEGATIVE Final    Comment: (NOTE) Fact Sheet for Patients: bloggercourse.com  Fact Sheet for Healthcare Providers: seriousbroker.it  This test is not yet approved or cleared by the [place]  FDA and has been authorized for detection and/or diagnosis of SARS-CoV-2 by FDA under an Emergency Use Authorization (EUA). This EUA will remain in effect (meaning this test can be used) for the duration of the COVID-19 declaration under Section 564(b)(1) of the Act, 21 U.S.C. section 360bbb-3(b)(1), unless the authorization is terminated or revoked.  Performed at [facility], [address], [place], [place] [address]   Blood Culture (routine x 2)     Status: None (Preliminary result)   Collection Time: 11/10/23 11:10 PM   Specimen: BLOOD RIGHT WRIST  Result Value Ref Range Status   Specimen Description   Final    BLOOD RIGHT WRIST Performed at [facility], [address] [place], [place] [address]    Special Requests   Final    BOTTLES DRAWN AEROBIC AND ANAEROBIC Blood Culture results may not be optimal due to an inadequate volume of blood received in culture bottles Performed at [facility], [address], [place], [place] [address]    Culture   Final    NO GROWTH 3 DAYS Performed at [facility], [address] [place], [place] [address]    Report Status PENDING  Incomplete  Blood Culture (routine x 2)     Status: None (Preliminary result)   Collection Time: 11/11/23 12:10 AM   Specimen: BLOOD LEFT HAND  Result Value Ref Range Status   Specimen Description   Final    BLOOD LEFT HAND Performed at [facility], [address] [place], [place] [address]    Special Requests   Final    BOTTLES DRAWN AEROBIC AND ANAEROBIC Blood Culture results may not be optimal due to an inadequate volume of blood received in culture bottles Performed at [facility], [address] [place], [place] [address]    Culture   Final    NO GROWTH 3 DAYS Performed at [facility], [address] [place], [place] [address]    Report Status PENDING  Incomplete  Urine Culture     Status: Abnormal   Collection Time: 11/11/23  4:40 AM   Specimen: Urine, Clean Catch  Result Value Ref Range Status   Specimen Description   Final    URINE, CLEAN CATCH Performed at [facility], [address] [place], [place] [address]    Special Requests   Final    NONE Performed at [facility], [address] [place], [place] [address]    Culture (A)  Final    <10,000 COLONIES/mL INSIGNIFICANT GROWTH Performed at [facility], [address], [place], [place] [address]    Report Status 11/12/2023 FINAL  Final  Respiratory (~20 pathogens) panel by PCR     Status: None   Collection Time: 11/12/23 12:14 PM   Specimen: Nasopharyngeal Swab; Respiratory  Result Value Ref Range Status   Adenovirus NOT DETECTED NOT DETECTED Final   Coronavirus 229E NOT DETECTED NOT DETECTED Final    Comment: (NOTE) The Coronavirus on the Respiratory Panel, DOES NOT test for the novel  Coronavirus (2019 nCoV)    Coronavirus HKU1 NOT  DETECTED NOT DETECTED Final   Coronavirus NL63 NOT DETECTED NOT DETECTED Final   Coronavirus OC43 NOT  DETECTED NOT DETECTED Final   Metapneumovirus NOT DETECTED NOT DETECTED Final   Rhinovirus / Enterovirus NOT DETECTED NOT DETECTED Final   Influenza A NOT DETECTED NOT DETECTED Final   Influenza B NOT DETECTED NOT DETECTED Final   Parainfluenza Virus 1 NOT DETECTED NOT DETECTED Final   Parainfluenza Virus 2 NOT DETECTED NOT DETECTED Final   Parainfluenza Virus 3 NOT DETECTED NOT DETECTED Final   Parainfluenza Virus 4 NOT DETECTED NOT DETECTED Final   Respiratory Syncytial Virus NOT DETECTED NOT DETECTED Final   Bordetella pertussis NOT DETECTED NOT DETECTED Final   Bordetella Parapertussis NOT DETECTED NOT DETECTED Final   Chlamydophila pneumoniae NOT DETECTED NOT DETECTED Final   Mycoplasma pneumoniae NOT DETECTED NOT DETECTED Final    Comment: Performed at [facility], [address], [place], [place] [address]     Time coordinating discharge: 25 minutes  SIGNED: Mennie LAMY, MD  [facility] 11/14/2023, 10:37 AM  If 7PM-7AM, please contact night-coverage [email or web address]

## 2023-11-14 NOTE — Progress Notes (Addendum)
 Tried Calling report  x2.  Report called successfully

## 2023-11-14 NOTE — TOC Transition Note (Signed)
 Transition of Care [facility]) - Discharge Note   Patient Details  Name: Karla Hoover MRN: [id number] Date of Birth: [birth year]  Transition of Care [facility]) CM/SW Contact:  Bascom Service, RN Phone Number: 11/14/2023, 11:30 AM   Clinical Narrative: d/c back to [facility] LTC rep [name] aware going to rm#[id number], report #[id number]. PTAR called. No further CM needs.      Final next level of care: Long Term Nursing Home Barriers to Discharge: No Barriers Identified   Patient Goals and CMS Choice Patient states their goals for this hospitalization and ongoing recovery are:: return LTC [facility].gov Compare Post Acute Care list provided to:: Patient Choice offered to / list presented to : Patient [place] ownership interest in [facility].provided to:: Patient    Discharge Placement                       Discharge Plan and Services Additional resources added to the After Visit Summary for     Discharge Planning Services: CM Consult Post Acute Care Choice: Nursing Home                               Social Drivers of Health (SDOH) Interventions SDOH Screenings   Food Insecurity: No Food Insecurity (11/11/2023)  Housing: Low Risk  (11/11/2023)  Transportation Needs: No Transportation Needs (11/11/2023)  Utilities: Not At Risk (11/11/2023)  Depression (PHQ2-9): Medium Risk (10/04/2019)  Financial Resource Strain: Not on File (08/17/2018)   Received from [place], [place]  Physical Activity: Not on File (08/17/2018)   Received from [place], [place]  Social Connections: Not on File (06/28/2023)   Received from [facility]  Stress: Not on File (08/17/2018)   Received from [place], [place]  Tobacco Use: Medium Risk (11/11/2023)     Readmission Risk Interventions    09/10/2023   10:01 AM 01/21/2023   12:08 PM 12/18/2022   11:10 AM  Readmission Risk Prevention Plan  Transportation Screening Complete Complete Complete  PCP or Specialist Appt within 5-7 Days  Complete    PCP or Specialist Appt within 3-5 Days Complete  Complete  Home Care Screening  Complete   Medication Review (RN CM)  Complete   HRI or Home Care Consult Complete  Complete  Social Work Consult for Recovery Care Planning/Counseling Complete  Complete  Palliative Care Screening Not Applicable  Not Applicable  Medication Review Oceanographer) Complete  Complete

## 2023-11-14 NOTE — Progress Notes (Signed)
 Physical Therapy Treatment Patient Details Name: Karla Hoover MRN: [id number] DOB: Jul 25, 1971 Today's Date: 11/14/2023   History of Present Illness 53 y.o.f  recently admitted and discharged on 10/24/2023 after patient was septic and had grown Candida glabrata and was treated with micafungin  until 11/02/2023 following which patient's PICC line was removed,started to experience sudden onset of fever chills and low blood pressure at the living facility and was brought to the ER. Dx of acute renal failure on CKD. Pt with h/o T2DM, COPD with ongoing tobacco abuse, grade 1 diastolic function, history of PE DVT and history of stroke with left-sided hemiplegia ESBL UTI    PT Comments  General Comments: AxO x 3 very pleasant Lady and highly motivated. Assisted OOB to amb in hallway.  General transfer comment: VCs hand placement and safety with turns.  General Gait Details: tolerated amb 22 feet with Hemi Walker at [employer] and increased time. Assisted back to bed per pt request. Pt plans to return to [place]   If plan is discharge home, recommend the following: A little help with bathing/dressing/bathroom;A little help with walking and/or transfers;Assistance with cooking/housework;Assist for transportation;Help with stairs or ramp for entrance;Direct supervision/assist for medications management   Can travel by private vehicle        Equipment Recommendations  None recommended by PT    Recommendations for Other Services       Precautions / Restrictions Precautions Precautions: Fall Precaution Comments: CVA L Hemi (4 years ago) Restrictions Weight Bearing Restrictions Per Provider Order: No     Mobility  Bed Mobility Overal bed mobility: Needs Assistance Bed Mobility: Supine to Sit, Sit to Supine     Supine to sit: Min assist Sit to supine: Min assist   General bed mobility comments: assist for BLEs into bed plus increased time to scoot    Transfers Overall  transfer level: Needs assistance Equipment used: Hemi-walker Transfers: Sit to/from Stand Sit to Stand: Contact guard assist           General transfer comment: VCs hand placement and safety with turns    Ambulation/Gait Ambulation/Gait assistance: Min assist, Contact guard assist Gait Distance (Feet): 22 Feet Assistive device: Hemi-walker Gait Pattern/deviations: Step-to pattern, Decreased step length - right, Decreased step length - left, Wide base of support Gait velocity: decr     General Gait Details: tolerated amb 22 feet with Hemi Walker at [employer] and increased time.   Stairs             Wheelchair Mobility     Tilt Bed    Modified Rankin (Stroke Patients Only)       Balance                                            Cognition Arousal: Alert Behavior During Therapy: WFL for tasks assessed/performed Overall Cognitive Status: Within Functional Limits for tasks assessed                                 General Comments: AxO x 3 very pleasant Lady and highly motivated.        Exercises      General Comments        Pertinent Vitals/Pain Pain Assessment Pain Assessment: No/denies pain    Home Living  Prior Function            PT Goals (current goals can now be found in the care plan section) Progress towards PT goals: Progressing toward goals    Frequency    Min 1X/week      PT Plan      Co-evaluation              AM-PAC PT 6 Clicks Mobility   Outcome Measure  Help needed turning from your back to your side while in a flat bed without using bedrails?: A Little Help needed moving from lying on your back to sitting on the side of a flat bed without using bedrails?: A Little Help needed moving to and from a bed to a chair (including a wheelchair)?: A Little Help needed standing up from a chair using your arms (e.g., wheelchair or  bedside chair)?: A Little Help needed to walk in hospital room?: A Little Help needed climbing 3-5 steps with a railing? : A Lot 6 Click Score: 17    End of Session Equipment Utilized During Treatment: Gait belt Activity Tolerance: Patient tolerated treatment well   Nurse Communication: Mobility status PT Visit Diagnosis: Other abnormalities of gait and mobility (R26.89);Hemiplegia and hemiparesis;Difficulty in walking, not elsewhere classified (R26.2) Hemiplegia - Right/Left: Left Hemiplegia - dominant/non-dominant: Non-dominant     Time: 8863-8797 PT Time Calculation (min) (ACUTE ONLY): 26 min  Charges:    $Gait Training: 8-22 mins $Therapeutic Activity: 8-22 mins PT General Charges $$ ACUTE PT VISIT: 1 Visit                     Katheryn Leap  PTA Acute  Rehabilitation Services Office M-F          [phone]

## 2023-11-16 LAB — CULTURE, BLOOD (ROUTINE X 2)
Culture: NO GROWTH
Culture: NO GROWTH

## 2023-11-20 ENCOUNTER — Other Ambulatory Visit: Payer: Self-pay | Admitting: Urology

## 2023-11-20 DIAGNOSIS — D49511 Neoplasm of unspecified behavior of right kidney: Secondary | ICD-10-CM

## 2023-11-21 ENCOUNTER — Encounter: Payer: Self-pay | Admitting: Urology

## 2023-12-02 ENCOUNTER — Encounter: Payer: Medicaid Other | Admitting: Physical Medicine and Rehabilitation

## 2024-02-17 ENCOUNTER — Ambulatory Visit
Admission: RE | Admit: 2024-02-17 | Discharge: 2024-02-17 | Disposition: A | Discharge status: Home / Self Care | Payer: Medicaid Other | Source: Ambulatory Visit | Attending: Urology

## 2024-02-17 DIAGNOSIS — D49511 Neoplasm of unspecified behavior of right kidney: Secondary | ICD-10-CM

## 2024-02-17 MED ORDER — GADOPICLENOL 0.5 MMOL/ML IV SOLN
9.0000 mL | Freq: Once | INTRAVENOUS | Status: AC | PRN
  Administered 2024-02-17: 9 mL via INTRAVENOUS

## 2024-06-01 ENCOUNTER — Emergency Department (HOSPITAL_COMMUNITY)
Admission: EM | Admit: 2024-06-01 | Discharge: 2024-06-02 | Disposition: A | Discharge status: Home / Self Care | Attending: Emergency Medicine | Admitting: Emergency Medicine

## 2024-06-01 DIAGNOSIS — Z794 Long term (current) use of insulin: Secondary | ICD-10-CM | POA: Diagnosis not present

## 2024-06-01 DIAGNOSIS — Z7982 Long term (current) use of aspirin: Secondary | ICD-10-CM | POA: Diagnosis not present

## 2024-06-01 DIAGNOSIS — R531 Weakness: Secondary | ICD-10-CM | POA: Diagnosis not present

## 2024-06-01 DIAGNOSIS — N189 Chronic kidney disease, unspecified: Secondary | ICD-10-CM | POA: Insufficient documentation

## 2024-06-01 DIAGNOSIS — Z7901 Long term (current) use of anticoagulants: Secondary | ICD-10-CM | POA: Diagnosis not present

## 2024-06-01 DIAGNOSIS — N390 Urinary tract infection, site not specified: Secondary | ICD-10-CM | POA: Diagnosis not present

## 2024-06-01 DIAGNOSIS — E1122 Type 2 diabetes mellitus with diabetic chronic kidney disease: Secondary | ICD-10-CM | POA: Insufficient documentation

## 2024-06-01 DIAGNOSIS — R300 Dysuria: Secondary | ICD-10-CM | POA: Diagnosis present

## 2024-06-02 ENCOUNTER — Emergency Department (HOSPITAL_COMMUNITY)

## 2024-06-02 ENCOUNTER — Encounter (HOSPITAL_COMMUNITY): Payer: Self-pay

## 2024-06-02 ENCOUNTER — Other Ambulatory Visit: Payer: Self-pay

## 2024-06-02 LAB — COMPREHENSIVE METABOLIC PANEL WITH GFR
ALT: 7 U/L (ref 0–44)
AST: 17 U/L (ref 15–41)
Albumin: 3.8 g/dL (ref 3.5–5.0)
Alkaline Phosphatase: 79 U/L (ref 38–126)
Anion gap: 13 (ref 5–15)
BUN: 17 mg/dL (ref 6–20)
CO2: 26 mmol/L (ref 22–32)
Calcium: 9.8 mg/dL (ref 8.9–10.3)
Chloride: 101 mmol/L (ref 98–111)
Creatinine, Ser: 1.1 mg/dL — ABNORMAL HIGH (ref 0.44–1.00)
GFR, Estimated: >60 mL/min (ref >60)
Glucose, Bld: 159 mg/dL — ABNORMAL HIGH (ref 70–99)
Potassium: 4.4 mmol/L (ref 3.5–5.1)
Sodium: 139 mmol/L (ref 135–145)
Total Bilirubin: 0.3 mg/dL (ref 0.0–1.2)
Total Protein: 6.9 g/dL (ref 6.5–8.1)

## 2024-06-02 LAB — URINALYSIS, W/ REFLEX TO CULTURE (INFECTION SUSPECTED)
Bilirubin Urine: NEGATIVE
Glucose, UA: NEGATIVE mg/dL
Hgb urine dipstick: NEGATIVE
Ketones, ur: NEGATIVE mg/dL
Nitrite: NEGATIVE
Protein, ur: 30 mg/dL — AB
Specific Gravity, Urine: 1.017 (ref 1.005–1.030)
pH: 5 (ref 5.0–8.0)

## 2024-06-02 LAB — CBC WITH DIFFERENTIAL/PLATELET
Abs Immature Granulocytes: 0.02 K/uL (ref 0.00–0.07)
Basophils Absolute: 0.1 K/uL (ref 0.0–0.1)
Basophils Relative: 1 %
Eosinophils Absolute: 0.2 K/uL (ref 0.0–0.5)
Eosinophils Relative: 2 %
HCT: 41.3 % (ref 36.0–46.0)
Hemoglobin: 12.5 g/dL (ref 12.0–15.0)
Immature Granulocytes: 0 %
Lymphocytes Relative: 44 %
Lymphs Abs: 4.7 K/uL — ABNORMAL HIGH (ref 0.7–4.0)
MCH: 26.1 pg (ref 26.0–34.0)
MCHC: 30.3 g/dL (ref 30.0–36.0)
MCV: 86.2 fL (ref 80.0–100.0)
Monocytes Absolute: 0.6 K/uL (ref 0.1–1.0)
Monocytes Relative: 6 %
Neutro Abs: 5.1 K/uL (ref 1.7–7.7)
Neutrophils Relative %: 47 %
Platelets: 251 K/uL (ref 150–400)
RBC: 4.79 MIL/uL (ref 3.87–5.11)
RDW: 13.5 % (ref 11.5–15.5)
WBC: 10.6 K/uL — ABNORMAL HIGH (ref 4.0–10.5)
nRBC: 0 % (ref 0.0–0.2)

## 2024-06-02 LAB — RESP PANEL BY RT-PCR (RSV, FLU A&B, COVID)  RVPGX2
Influenza A by PCR: NEGATIVE
Influenza B by PCR: NEGATIVE
Resp Syncytial Virus by PCR: NEGATIVE
SARS Coronavirus 2 by RT PCR: NEGATIVE

## 2024-06-02 LAB — PROTIME-INR
INR: 1.3 — ABNORMAL HIGH (ref 0.8–1.2)
Prothrombin Time: 16.4 s — ABNORMAL HIGH (ref 11.4–15.2)

## 2024-06-02 LAB — I-STAT CG4 LACTIC ACID, ED
Lactic Acid, Venous: 1.4 mmol/L (ref 0.5–1.9)
Lactic Acid, Venous: 1.1 mmol/L (ref 0.5–1.9)

## 2024-06-02 LAB — TROPONIN T, HIGH SENSITIVITY
Troponin T High Sensitivity: 23 ng/L — ABNORMAL HIGH (ref 0–19)
Troponin T High Sensitivity: 23 ng/L — ABNORMAL HIGH (ref 0–19)

## 2024-06-02 MED ORDER — CEFDINIR 300 MG PO CAPS: 300.0000 mg | ORAL_CAPSULE | Freq: Two times a day (BID) | ORAL | 0 refills | Status: DC

## 2024-06-02 MED ORDER — SODIUM CHLORIDE 0.9 % IV SOLN
1.0000 g | Freq: Once | INTRAVENOUS | Status: AC
  Administered 2024-06-02: 1 g via INTRAVENOUS
  Filled 2024-06-02: qty 10

## 2024-06-02 MED ORDER — SODIUM CHLORIDE 0.9 % IV BOLUS
1000.0000 mL | Freq: Once | INTRAVENOUS | Status: AC
  Administered 2024-06-02: 1000 mL via INTRAVENOUS

## 2024-06-02 MED ORDER — IOHEXOL 350 MG/ML SOLN
75.0000 mL | Freq: Once | INTRAVENOUS | Status: AC | PRN
  Administered 2024-06-02: 75 mL via INTRAVENOUS

## 2024-06-02 NOTE — Discharge Instructions (Addendum)
 Your heart enzymes or slightly high today. Please follow up with your family doctor or cardiologist for further evaluation. Get rechecked if you have new or worsening symptoms.

## 2024-06-02 NOTE — ED Provider Notes (Signed)
 [place] EMERGENCY DEPARTMENT AT [facility] Provider Note   CSN: [id number] Arrival date & time: 06/01/24  2348     Patient presents with: Weakness   Karla Hoover is a 53 y.o. female.   The history is provided by the patient, the EMS personnel and medical records.  Weakness Karla Hoover is a 53 y.o. female who presents to the Emergency Department complaining of weakness.  She presents to the ED by EMS from [facility] for feeling unwell that started today.  Has generalized weakness, feels cold with sob, heartburn and dysuria.  Reports similar episodes related to sepsis.   Hx/o DM, CVA with persistent left sided weakness, PE on anticoagulation, CKD     Prior to Admission medications   Medication Sig Start Date End Date Taking? Authorizing Provider  cefdinir  (OMNICEF ) 300 MG capsule Take 1 capsule (300 mg total) by mouth 2 (two) times daily. 06/02/24  Yes Griselda Norris, MD  acetaminophen  (TYLENOL ) 500 MG tablet Take 1,000 mg by mouth 3 (three) times daily.    [provider]  albuterol  (VENTOLIN  HFA) 108 (90 Base) MCG/ACT inhaler Inhale 2 puffs into the lungs every 6 (six) hours as needed for wheezing or shortness of breath. Patient not taking: Reported on 11/11/2023 10/04/19   Celestia Rosaline SQUIBB, NP  apixaban  (ELIQUIS ) 5 MG TABS tablet Take 1 tablet (5mg ) twice daily Patient taking differently: Take 5 mg by mouth 2 (two) times daily. 09/10/22   Cindy Garnette POUR, MD  aspirin  81 MG chewable tablet Chew 81 mg by mouth in the morning.    [provider]  atenolol  (TENORMIN ) 50 MG tablet Take 50 mg by mouth 2 (two) times daily.    [provider]  atorvastatin  (LIPITOR ) 80 MG tablet Take 1 tablet (80 mg total) by mouth daily. Patient taking differently: Take 80 mg by mouth at bedtime. 10/04/19   Celestia Rosaline SQUIBB, NP  barrier cream (NON-SPECIFIED) CREA Apply 1 Application topically See admin instructions. Apply to affected  areas 2 times a day    [provider]  chlorhexidine  (PERIDEX ) 0.12 % solution Use as directed 15 mLs in the mouth or throat See admin instructions. Rinse the mouth with 15 ml's once a day as directed, then spit out    [provider]  Cholecalciferol  (VITAMIN D3) 1.25 MG (50000 UT) CAPS Take 50,000 Units by mouth every Thursday.    [provider]  clonazePAM  (KLONOPIN ) 0.5 MG tablet Take 1 tablet (0.5 mg total) by mouth 2 (two) times daily for 4 doses. 11/14/23 11/16/23  Christobal Guadalajara, MD  cyclobenzaprine  (FLEXERIL ) 10 MG tablet Take 1 tablet (10 mg total) by mouth 3 (three) times daily as needed for muscle spasms. 10/04/19   Celestia Rosaline SQUIBB, NP  EPINEPHrine (EPIPEN 2-PAK) 0.3 mg/0.3 mL IJ SOAJ injection Inject 0.3 mg into the muscle daily as needed for anaphylaxis.    [provider]  ezetimibe  (ZETIA ) 10 MG tablet Take 10 mg by mouth at bedtime.    [provider]  FLUoxetine  (PROZAC ) 20 MG capsule Take 20 mg by mouth daily.    [provider]  fluticasone -salmeterol (ADVAIR) 100-50 MCG/ACT AEPB Inhale 1 puff into the lungs 2 (two) times daily.    [provider]  gabapentin  (NEURONTIN ) 400 MG capsule Take 1 capsule (400 mg total) by mouth 3 (three) times daily. 10/24/23   Krishnan, Gokul, MD  hydrALAZINE  (APRESOLINE ) 10 MG tablet Take 10 mg by mouth every  12 (twelve) hours as needed (for a Systolic reading >839 or Diastolic reading >04).    [provider]  INCRUSE ELLIPTA  62.5 MCG/ACT AEPB Inhale 1 puff into the lungs daily.    [provider]  insulin  glargine (LANTUS  SOLOSTAR) 100 UNIT/ML Solostar Pen Inject 15 Units into the skin at bedtime.    [provider]  insulin  lispro (HUMALOG ) 100 UNIT/ML injection Inject 0-14 Units into the skin See admin instructions. Inject 0-14 units twice daily as per sliding scale : CBG 0-200 : 0 units CBG 201-250 : 2 units CBG 251-300 : 4 units CBG 301-350 : 6 units CBG  351-400 : 8 units CBG 401-450 : 10 units CBG 451-500 : 12 units (if BP reads high, inject 14 units)    [provider]  LACTOBACILLUS PO Take 1 capsule by mouth in the morning and at bedtime.    [provider]  levothyroxine  (SYNTHROID ) 88 MCG tablet Take 88 mcg by mouth in the morning.    [provider]  losartan  (COZAAR ) 25 MG tablet Take 37.5 mg by mouth daily.    [provider]  MACROBID  100 MG capsule Take 100 mg by mouth in the morning.    [provider]  Melatonin 5 MG TBDP Take 5 mg by mouth at bedtime.    [provider]  Omega-3 Fatty Acids (FISH OIL) 1000 MG CAPS Take 1,000 mg by mouth at bedtime.    [provider]  omeprazole  (PRILOSEC) 20 MG capsule Take 20 mg by mouth in the morning.    [provider]  ondansetron  (ZOFRAN ) 4 MG tablet Take 4 mg by mouth every 8 (eight) hours as needed for nausea or vomiting.    [provider]  OXYGEN  Inhale 2 L/min into the lungs as needed (for shortness of breath).    [provider]  polyethylene glycol powder (GLYCOLAX /MIRALAX ) 17 GM/SCOOP powder Take 17 g by mouth daily.    [provider]  Propyl Glycol-Hydroxyethylcell (NASAL MOIST) GEL Place 1 application  into both nostrils at bedtime.    [provider]  QUEtiapine  (SEROQUEL ) 25 MG tablet Take 1 tablet (25 mg total) by mouth at bedtime. 10/24/23   Krishnan, Gokul, MD  rOPINIRole  (REQUIP ) 0.25 MG tablet Take 1 tablet (0.25 mg total) by mouth at bedtime. 10/24/23   Krishnan, Gokul, MD  senna-docusate (SENOKOT-S) 8.6-50 MG tablet Take 1 tablet by mouth 2 (two) times daily. 10/24/23   Krishnan, Gokul, MD  TRULICITY  1.5 MG/0.5ML EMMANUEL Inject 1.5 mg into the skin every Friday.    [provider]  VOLTAREN  1 % GEL Apply 2 g topically See admin instructions. Apply 2 grams to the posterior neck  2 times a day    [provider]  witch hazel-glycerin (TUCKS) pad Apply 1  Application topically every 4 (four) hours as needed for hemorrhoids.    [provider]    Allergies: Cipro  [ciprofloxacin  hcl], Guaifenesin , Kiwi extract, Levaquin [levofloxacin], Strawberry extract, Lioresal [baclofen], and Vancomycin     Review of Systems  Neurological:  Positive for weakness.  All other systems reviewed and are negative.   Updated Vital Signs BP 130/76   Pulse 73   Temp 97.7 F (36.5 C) (Oral)   Resp 16   Ht 5' 7 (1.702 m)   Wt 88 kg   LMP 10/20/2019   SpO2 99%   BMI 30.38 kg/m   Physical Exam Vitals and nursing note reviewed.  Constitutional:  Appearance: She is well-developed.  HENT:     Head: Normocephalic and atraumatic.  Cardiovascular:     Rate and Rhythm: Normal rate and regular rhythm.     Heart sounds: No murmur heard. Pulmonary:     Effort: Pulmonary effort is normal. No respiratory distress.     Breath sounds: Normal breath sounds.  Abdominal:     Palpations: Abdomen is soft.     Tenderness: There is no abdominal tenderness. There is no guarding or rebound.  Musculoskeletal:        General: No tenderness.     Comments: No wounds to feet.  2+ DP pulses.  Erythematous patches to feet bilaterally c/w chronic fungal infection  Skin:    General: Skin is warm and dry.  Neurological:     Mental Status: She is alert and oriented to person, place, and time.     Comments: Five out of five strength and right upper extremity, 4+ out of five strength in the left upper extremity, lives bilateral lower extremities off the stretcher  Psychiatric:        Behavior: Behavior normal.     (all labs ordered are listed, but only abnormal results are displayed) Labs Reviewed  COMPREHENSIVE METABOLIC PANEL WITH GFR - Abnormal; Notable for the following components:      Result Value   Glucose, Bld 159 (*)    Creatinine, Ser 1.10 (*)    All other components within normal limits  CBC WITH DIFFERENTIAL/PLATELET - Abnormal; Notable for the  following components:   WBC 10.6 (*)    Lymphs Abs 4.7 (*)    All other components within normal limits  PROTIME-INR - Abnormal; Notable for the following components:   Prothrombin Time 16.4 (*)    INR 1.3 (*)    All other components within normal limits  URINALYSIS, W/ REFLEX TO CULTURE (INFECTION SUSPECTED) - Abnormal; Notable for the following components:   APPearance HAZY (*)    Protein, ur 30 (*)    Leukocytes,Ua SMALL (*)    Bacteria, UA MANY (*)    All other components within normal limits  TROPONIN T, HIGH SENSITIVITY - Abnormal; Notable for the following components:   Troponin T High Sensitivity 23 (*)    All other components within normal limits  TROPONIN T, HIGH SENSITIVITY - Abnormal; Notable for the following components:   Troponin T High Sensitivity 23 (*)    All other components within normal limits  RESP PANEL BY RT-PCR (RSV, FLU A&B, COVID)  RVPGX2  CULTURE, BLOOD (ROUTINE X 2)  CULTURE, BLOOD (ROUTINE X 2)  URINE CULTURE  I-STAT CG4 LACTIC ACID, ED  I-STAT CG4 LACTIC ACID, ED    EKG: EKG Interpretation Date/Time:  Wednesday June 02 2024 00:26:37 EDT Ventricular Rate:  77 PR Interval:  183 QRS Duration:  104 QT Interval:  416 QTC Calculation: 471 R Axis:   73  Text Interpretation: Sinus rhythm Low voltage, precordial leads Nonspecific T abnormalities, lateral leads Confirmed by Griselda Norris [phone]) on 06/02/2024 1:23:52 AM  Radiology: CT Angio Chest PE W/Cm &/Or Wo Cm Result Date: 06/02/2024 CLINICAL DATA:  Shortness of breath and weakness EXAM: CT ANGIOGRAPHY CHEST WITH CONTRAST TECHNIQUE: Multidetector CT imaging of the chest was performed using the standard protocol during bolus administration of intravenous contrast. Multiplanar CT image reconstructions and MIPs were obtained to evaluate the vascular anatomy. RADIATION DOSE REDUCTION: This exam was performed according to the departmental dose-optimization program which includes automated exposure  control, adjustment of  the mA and/or kV according to patient size and/or use of iterative reconstruction technique. CONTRAST:  75mL OMNIPAQUE  IOHEXOL  350 MG/ML SOLN COMPARISON:  Chest x-ray from earlier in the same day. FINDINGS: Cardiovascular: Atherosclerotic calcifications of the thoracic aorta are noted. No aneurysmal dilatation is seen. Pulmonary artery shows a normal branching pattern bilaterally. No intraluminal filling defect to suggest pulmonary embolism is noted. Scattered coronary calcifications are noted. Mediastinum/Nodes: Thoracic inlet is within normal limits. No hilar or mediastinal adenopathy is noted. The esophagus is within normal limits. Lungs/Pleura: Lungs are well aerated bilaterally. No focal infiltrate or sizable effusion is seen. Upper Abdomen: Mild atrophy of the right kidney is noted stable from prior exams. The remainder of the upper abdomen is within normal limits. Musculoskeletal: No chest wall abnormality. No acute or significant osseous findings. Review of the MIP images confirms the above findings. IMPRESSION: No evidence of pulmonary emboli. Right renal atrophy. Electronically Signed   By: Oneil Devonshire M.D.   On: 06/02/2024 04:01   DG Chest Port 1 View Result Date: 06/02/2024 CLINICAL DATA:  Possible sepsis, shortness of breath EXAM: PORTABLE CHEST 1 VIEW COMPARISON:  11/10/2023 FINDINGS: Cardiac shadow is within normal limits. The lungs are well aerated bilaterally. No focal infiltrate or effusion is seen. No bony abnormality is noted. IMPRESSION: No active disease. Electronically Signed   By: Oneil Devonshire M.D.   On: 06/02/2024 01:13     Procedures   Medications Ordered in the ED  sodium chloride  0.9 % bolus 1,000 mL (0 mLs Intravenous Stopped 06/02/24 0413)  iohexol  (OMNIPAQUE ) 350 MG/ML injection 75 mL (75 mLs Intravenous Contrast Given 06/02/24 0333)  cefTRIAXone  (ROCEPHIN ) 1 g in sodium chloride  0.9 % 100 mL IVPB (0 g Intravenous Stopped 06/02/24 0533)                                     Medical Decision Making Amount and/or Complexity of Data Reviewed Labs: ordered. Radiology: ordered.  Risk Prescription drug management.   Patient with history of diabetes, PE on anticoagulation, CVA here for evaluation of feeling hot and cold, concern for developing sepsis. She also does have some chest discomfort described as indigestion for the last week in dysuria. No evidence of sepsis on labs. She is negative for Covid. UA is concerning for UTI - Wilson culture and started on antibiotics. Given her respiratory symptoms a CTA was obtained, which is negative for PE or pneumonia. Troponins are minimally elevated but flat, picture is not consistent with ACS. Feel patient is stable for discharge back to facility with close outpatient follow-up with return precautions     Final diagnoses:  Generalized weakness  Acute UTI    ED Discharge Orders          Ordered    cefdinir  (OMNICEF ) 300 MG capsule  2 times daily        06/02/24 0523               Griselda Norris, MD 06/02/24 [phone]

## 2024-06-02 NOTE — ED Notes (Signed)
 Pt returning to [facility] via [place].   All paperwork handed off.

## 2024-06-02 NOTE — ED Triage Notes (Signed)
 Pt BIB EMS coming from [facility]. C/O of feeling cold, shob, has weakness and is unsteady on her feet. Symptoms started tonight. Denies N/V/D or any fever. Fever like when she was septic before.  EMS: BP 110/64, Spo2 92%, RR 16 CBG 166, HR 83

## 2024-06-04 LAB — URINE CULTURE: Culture: >=100000 — AB

## 2024-06-05 ENCOUNTER — Telehealth (HOSPITAL_BASED_OUTPATIENT_CLINIC_OR_DEPARTMENT_OTHER): Payer: Self-pay | Admitting: *Deleted

## 2024-06-05 NOTE — Telephone Encounter (Signed)
 Post ED Visit - Positive Culture Follow-up  Culture report reviewed by antimicrobial stewardship pharmacist: [facility] []  Rankin Dee, Pharm.D. []  Venetia Gully, [address].D., BCPS AQ-ID []  Garrel Crews, Pharm.D., BCPS []  Almarie Lunger, Pharm.D., BCPS []  Franks Field, [place].D., BCPS, AAHIVP []  Rosaline Bihari, Pharm.D., BCPS, AAHIVP []  Vernell Meier, PharmD, BCPS []  Latanya Hint, PharmD, BCPS []  Donald Medley, PharmD, BCPS []  Rocky Bold, PharmD []  Dorothyann Alert, PharmD, BCPS []  Morene Babe, PharmD  [facility] []  Rosaline Edison, PharmD []  Romona Bliss, PharmD []  Dolphus Roller, PharmD []  Veva Seip, Rph []  Vernell Daunt) Leonce, PharmD []  Eva Allis, PharmD []  Rosaline Millet, PharmD []  Iantha Batch, PharmD []  Arvin Gauss, PharmD []  Wanda Hasting, PharmD []  Ronal Rav, PharmD []  Rocky Slade, PharmD [x]  Eleanor Agent, PharmD   Positive urine culture Pt treated with Cefdinir .   Called facility and nurse at [facility] stated pt is afebrile and feeling better. No additional treatment needed per Steinl, Kevin. Faxed copy of culture report to Leonidas Frost, Alan Novak 06/05/2024, 10:23 AM

## 2024-06-05 NOTE — Progress Notes (Signed)
 ED Antimicrobial Stewardship Positive Culture Follow Up   Karla Hoover is an 52 y.o. female who presented to [facility] on 06/01/2024 with a chief complaint of weakness. Chief Complaint  Patient presents with   Weakness    Recent Results (from the past 720 hours)  Urine Culture     Status: Abnormal   Collection Time: 06/02/24 12:19 AM   Specimen: Urine, Random  Result Value Ref Range Status   Specimen Description   Final    URINE, RANDOM Performed at [facility], [address], [place], [place] [address]    Special Requests   Final    NONE Reflexed from [id number] Performed at [facility], [address] [place], [place] [address]    Culture (A)  Final    >=100,000 COLONIES/mL ESCHERICHIA COLI Confirmed Extended Spectrum Beta-Lactamase Producer (ESBL).  In bloodstream infections from ESBL organisms, carbapenems are preferred over piperacillin/tazobactam. They are shown to have a lower risk of mortality.    Report Status 06/04/2024 FINAL  Final   Organism ID, Bacteria ESCHERICHIA COLI (A)  Final      Susceptibility   Escherichia coli - MIC*    AMPICILLIN >=32 RESISTANT Resistant     CEFAZOLIN (URINE) Value in next row Resistant      >=32 RESISTANTThis is a modified FDA-approved test that has been validated and its performance characteristics determined by the reporting laboratory.  This laboratory is certified under the Clinical Laboratory Improvement Amendments CLIA as qualified to perform high complexity clinical laboratory testing.    CEFEPIME  Value in next row Resistant      >=32 RESISTANTThis is a modified FDA-approved test that has been validated and its performance characteristics determined by the reporting laboratory.  This laboratory is certified under the Clinical Laboratory Improvement Amendments CLIA as qualified to perform high complexity clinical laboratory testing.    ERTAPENEM  Value in next row Sensitive      >=32  RESISTANTThis is a modified FDA-approved test that has been validated and its performance characteristics determined by the reporting laboratory.  This laboratory is certified under the Clinical Laboratory Improvement Amendments CLIA as qualified to perform high complexity clinical laboratory testing.    CEFTRIAXONE  Value in next row Resistant      >=32 RESISTANTThis is a modified FDA-approved test that has been validated and its performance characteristics determined by the reporting laboratory.  This laboratory is certified under the Clinical Laboratory Improvement Amendments CLIA as qualified to perform high complexity clinical laboratory testing.    CIPROFLOXACIN  Value in next row Resistant      >=32 RESISTANTThis is a modified FDA-approved test that has been validated and its performance characteristics determined by the reporting laboratory.  This laboratory is certified under the Clinical Laboratory Improvement Amendments CLIA as qualified to perform high complexity clinical laboratory testing.    GENTAMICIN Value in next row Resistant      >=32 RESISTANTThis is a modified FDA-approved test that has been validated and its performance characteristics determined by the reporting laboratory.  This laboratory is certified under the Clinical Laboratory Improvement Amendments CLIA as qualified to perform high complexity clinical laboratory testing.    NITROFURANTOIN  Value in next row Resistant      >=32 RESISTANTThis is a modified FDA-approved test that has been validated and its performance characteristics determined by the reporting laboratory.  This laboratory is certified under the Clinical Laboratory Improvement Amendments CLIA as qualified to perform high complexity clinical laboratory testing.  TRIMETH /SULFA  Value in next row Resistant      >=32 RESISTANTThis is a modified FDA-approved test that has been validated and its performance characteristics determined by the reporting laboratory.  This  laboratory is certified under the Clinical Laboratory Improvement Amendments CLIA as qualified to perform high complexity clinical laboratory testing.    AMPICILLIN/SULBACTAM Value in next row Resistant      >=32 RESISTANTThis is a modified FDA-approved test that has been validated and its performance characteristics determined by the reporting laboratory.  This laboratory is certified under the Clinical Laboratory Improvement Amendments CLIA as qualified to perform high complexity clinical laboratory testing.    PIP/TAZO Value in next row Sensitive ug/mL     <=4 SENSITIVEThis is a modified FDA-approved test that has been validated and its performance characteristics determined by the reporting laboratory.  This laboratory is certified under the Clinical Laboratory Improvement Amendments CLIA as qualified to perform high complexity clinical laboratory testing.    MEROPENEM  Value in next row Sensitive      <=4 SENSITIVEThis is a modified FDA-approved test that has been validated and its performance characteristics determined by the reporting laboratory.  This laboratory is certified under the Clinical Laboratory Improvement Amendments CLIA as qualified to perform high complexity clinical laboratory testing.    * >=100,000 COLONIES/mL ESCHERICHIA COLI  Resp panel by RT-PCR (RSV, Flu A&B, Covid) Anterior Nasal Swab     Status: None   Collection Time: 06/02/24 12:32 AM   Specimen: Anterior Nasal Swab  Result Value Ref Range Status   SARS Coronavirus 2 by RT PCR NEGATIVE NEGATIVE Final    Comment: (NOTE) SARS-CoV-2 target nucleic acids are NOT DETECTED.  The SARS-CoV-2 RNA is generally detectable in upper respiratory specimens during the acute phase of infection. The lowest concentration of SARS-CoV-2 viral copies this assay can detect is 138 copies/mL. A negative result does not preclude SARS-Cov-2 infection and should not be used as the sole basis for treatment or other patient management  decisions. A negative result may occur with  improper specimen collection/handling, submission of specimen other than nasopharyngeal swab, presence of viral mutation(s) within the areas targeted by this assay, and inadequate number of viral copies(<138 copies/mL). A negative result must be combined with clinical observations, patient history, and epidemiological information. The expected result is Negative.  Fact Sheet for Patients:  BloggerCourse.com  Fact Sheet for Healthcare Providers:  SeriousBroker.it  This test is no t yet approved or cleared by the [place]  FDA and  has been authorized for detection and/or diagnosis of SARS-CoV-2 by FDA under an Emergency Use Authorization (EUA). This EUA will remain  in effect (meaning this test can be used) for the duration of the COVID-19 declaration under Section 564(b)(1) of the Act, 21 U.S.C.section 360bbb-3(b)(1), unless the authorization is terminated  or revoked sooner.       Influenza A by PCR NEGATIVE NEGATIVE Final   Influenza B by PCR NEGATIVE NEGATIVE Final    Comment: (NOTE) The Xpert Xpress SARS-CoV-2/FLU/RSV plus assay is intended as an aid in the diagnosis of influenza from Nasopharyngeal swab specimens and should not be used as a sole basis for treatment. Nasal washings and aspirates are unacceptable for Xpert Xpress SARS-CoV-2/FLU/RSV testing.  Fact Sheet for Patients: BloggerCourse.com  Fact Sheet for Healthcare Providers: SeriousBroker.it  This test is not yet approved or cleared by the [place]  FDA and has been authorized for detection and/or diagnosis of SARS-CoV-2 by FDA under an Emergency Use Authorization (EUA). This EUA will  remain in effect (meaning this test can be used) for the duration of the COVID-19 declaration under Section 564(b)(1) of the Act, 21 U.S.C. section 360bbb-3(b)(1), unless the  authorization is terminated or revoked.     Resp Syncytial Virus by PCR NEGATIVE NEGATIVE Final    Comment: (NOTE) Fact Sheet for Patients: BloggerCourse.com  Fact Sheet for Healthcare Providers: SeriousBroker.it  This test is not yet approved or cleared by the [place]  FDA and has been authorized for detection and/or diagnosis of SARS-CoV-2 by FDA under an Emergency Use Authorization (EUA). This EUA will remain in effect (meaning this test can be used) for the duration of the COVID-19 declaration under Section 564(b)(1) of the Act, 21 U.S.C. section 360bbb-3(b)(1), unless the authorization is terminated or revoked.  Performed at [facility], [address], [place], [place] [address]   Blood Culture (routine x 2)     Status: None (Preliminary result)   Collection Time: 06/02/24 12:51 AM   Specimen: BLOOD  Result Value Ref Range Status   Specimen Description   Final    BLOOD BLOOD RIGHT FOREARM Performed at [facility], [address] [place], [place] [address]    Special Requests   Final    BOTTLES DRAWN AEROBIC AND ANAEROBIC Blood Culture results may not be optimal due to an inadequate volume of blood received in culture bottles Performed at [facility], [address], [place], [place] [address]    Culture   Final    NO GROWTH 3 DAYS Performed at [facility], [address], [place], [place] [address]    Report Status PENDING  Incomplete    [x]  Treated with cefdinir , organism resistant to prescribed antimicrobial []  Patient discharged originally without antimicrobial agent and treatment is now indicated  New antibiotic prescription:  None currently due to urine culture showing suspected colonization.  Call facility and inquire on patient's status.  If no fever and clinically improved then no additional treatment (finish course of cefdinir ).  Return  to ED if not improved.  ED Provider: Steinl, Kevin   Blayne Frankie, PharmD, BCPS 06/05/2024, 9:37 AM Clinical Pharmacist Monday - Friday phone -  [phone] Saturday - Sunday phone - [phone]

## 2024-06-07 LAB — CULTURE, BLOOD (ROUTINE X 2): Culture: NO GROWTH

## 2024-07-02 ENCOUNTER — Other Ambulatory Visit: Payer: Self-pay | Admitting: Urology

## 2024-07-02 DIAGNOSIS — D49511 Neoplasm of unspecified behavior of right kidney: Secondary | ICD-10-CM

## 2024-09-22 ENCOUNTER — Other Ambulatory Visit: Payer: Self-pay | Admitting: Family Medicine

## 2024-09-22 DIAGNOSIS — Z1231 Encounter for screening mammogram for malignant neoplasm of breast: Secondary | ICD-10-CM

## 2024-10-09 ENCOUNTER — Other Ambulatory Visit: Payer: Self-pay

## 2024-10-09 ENCOUNTER — Emergency Department (HOSPITAL_COMMUNITY)

## 2024-10-09 ENCOUNTER — Encounter (HOSPITAL_COMMUNITY): Payer: Self-pay

## 2024-10-09 ENCOUNTER — Inpatient Hospital Stay (HOSPITAL_COMMUNITY)
Admission: EM | Admit: 2024-10-09 | Discharge: 2024-10-14 | DRG: 193 | Disposition: A | Discharge status: Skilled Nursing Facility | Source: Skilled Nursing Facility

## 2024-10-09 DIAGNOSIS — Z7901 Long term (current) use of anticoagulants: Secondary | ICD-10-CM

## 2024-10-09 DIAGNOSIS — Z1152 Encounter for screening for COVID-19: Secondary | ICD-10-CM

## 2024-10-09 DIAGNOSIS — Z1612 Extended spectrum beta lactamase (ESBL) resistance: Secondary | ICD-10-CM | POA: Diagnosis present

## 2024-10-09 DIAGNOSIS — Z87891 Personal history of nicotine dependence: Secondary | ICD-10-CM

## 2024-10-09 DIAGNOSIS — K219 Gastro-esophageal reflux disease without esophagitis: Secondary | ICD-10-CM | POA: Diagnosis present

## 2024-10-09 DIAGNOSIS — J189 Pneumonia, unspecified organism: Secondary | ICD-10-CM | POA: Diagnosis not present

## 2024-10-09 DIAGNOSIS — I5189 Other ill-defined heart diseases: Secondary | ICD-10-CM | POA: Diagnosis not present

## 2024-10-09 DIAGNOSIS — R0789 Other chest pain: Principal | ICD-10-CM

## 2024-10-09 DIAGNOSIS — J9621 Acute and chronic respiratory failure with hypoxia: Secondary | ICD-10-CM | POA: Diagnosis present

## 2024-10-09 DIAGNOSIS — B9620 Unspecified Escherichia coli [E. coli] as the cause of diseases classified elsewhere: Secondary | ICD-10-CM | POA: Diagnosis present

## 2024-10-09 DIAGNOSIS — E6609 Other obesity due to excess calories: Secondary | ICD-10-CM | POA: Diagnosis present

## 2024-10-09 DIAGNOSIS — F419 Anxiety disorder, unspecified: Secondary | ICD-10-CM | POA: Diagnosis present

## 2024-10-09 DIAGNOSIS — Z7951 Long term (current) use of inhaled steroids: Secondary | ICD-10-CM

## 2024-10-09 DIAGNOSIS — I130 Hypertensive heart and chronic kidney disease with heart failure and stage 1 through stage 4 chronic kidney disease, or unspecified chronic kidney disease: Secondary | ICD-10-CM | POA: Diagnosis present

## 2024-10-09 DIAGNOSIS — E1169 Type 2 diabetes mellitus with other specified complication: Secondary | ICD-10-CM | POA: Diagnosis present

## 2024-10-09 DIAGNOSIS — I69354 Hemiplegia and hemiparesis following cerebral infarction affecting left non-dominant side: Secondary | ICD-10-CM

## 2024-10-09 DIAGNOSIS — J4489 Other specified chronic obstructive pulmonary disease: Secondary | ICD-10-CM | POA: Diagnosis present

## 2024-10-09 DIAGNOSIS — E66811 Obesity, class 1: Secondary | ICD-10-CM | POA: Diagnosis present

## 2024-10-09 DIAGNOSIS — Z7985 Long-term (current) use of injectable non-insulin antidiabetic drugs: Secondary | ICD-10-CM

## 2024-10-09 DIAGNOSIS — Z79899 Other long term (current) drug therapy: Secondary | ICD-10-CM

## 2024-10-09 DIAGNOSIS — I10 Essential (primary) hypertension: Secondary | ICD-10-CM | POA: Diagnosis not present

## 2024-10-09 DIAGNOSIS — F32A Depression, unspecified: Secondary | ICD-10-CM | POA: Diagnosis present

## 2024-10-09 DIAGNOSIS — E039 Hypothyroidism, unspecified: Secondary | ICD-10-CM | POA: Diagnosis present

## 2024-10-09 DIAGNOSIS — Z8249 Family history of ischemic heart disease and other diseases of the circulatory system: Secondary | ICD-10-CM

## 2024-10-09 DIAGNOSIS — I8290 Acute embolism and thrombosis of unspecified vein: Secondary | ICD-10-CM | POA: Diagnosis not present

## 2024-10-09 DIAGNOSIS — Z86718 Personal history of other venous thrombosis and embolism: Secondary | ICD-10-CM

## 2024-10-09 DIAGNOSIS — Z825 Family history of asthma and other chronic lower respiratory diseases: Secondary | ICD-10-CM

## 2024-10-09 DIAGNOSIS — Z86711 Personal history of pulmonary embolism: Secondary | ICD-10-CM

## 2024-10-09 DIAGNOSIS — Z6832 Body mass index (BMI) 32.0-32.9, adult: Secondary | ICD-10-CM

## 2024-10-09 DIAGNOSIS — J440 Chronic obstructive pulmonary disease with acute lower respiratory infection: Secondary | ICD-10-CM | POA: Diagnosis present

## 2024-10-09 DIAGNOSIS — E1122 Type 2 diabetes mellitus with diabetic chronic kidney disease: Secondary | ICD-10-CM | POA: Diagnosis present

## 2024-10-09 DIAGNOSIS — N183 Chronic kidney disease, stage 3 unspecified: Secondary | ICD-10-CM | POA: Diagnosis present

## 2024-10-09 DIAGNOSIS — Z8619 Personal history of other infectious and parasitic diseases: Secondary | ICD-10-CM

## 2024-10-09 DIAGNOSIS — I5032 Chronic diastolic (congestive) heart failure: Secondary | ICD-10-CM | POA: Diagnosis present

## 2024-10-09 DIAGNOSIS — R0902 Hypoxemia: Secondary | ICD-10-CM

## 2024-10-09 DIAGNOSIS — Z794 Long term (current) use of insulin: Secondary | ICD-10-CM

## 2024-10-09 DIAGNOSIS — Z7982 Long term (current) use of aspirin: Secondary | ICD-10-CM

## 2024-10-09 DIAGNOSIS — Z833 Family history of diabetes mellitus: Secondary | ICD-10-CM

## 2024-10-09 DIAGNOSIS — R109 Unspecified abdominal pain: Secondary | ICD-10-CM

## 2024-10-09 DIAGNOSIS — Z79890 Hormone replacement therapy (postmenopausal): Secondary | ICD-10-CM

## 2024-10-09 DIAGNOSIS — Z8673 Personal history of transient ischemic attack (TIA), and cerebral infarction without residual deficits: Secondary | ICD-10-CM

## 2024-10-09 DIAGNOSIS — Z87440 Personal history of urinary (tract) infections: Secondary | ICD-10-CM

## 2024-10-09 DIAGNOSIS — N390 Urinary tract infection, site not specified: Secondary | ICD-10-CM

## 2024-10-09 DIAGNOSIS — N1831 Chronic kidney disease, stage 3a: Secondary | ICD-10-CM | POA: Diagnosis not present

## 2024-10-09 DIAGNOSIS — J439 Emphysema, unspecified: Secondary | ICD-10-CM | POA: Diagnosis present

## 2024-10-09 DIAGNOSIS — J9611 Chronic respiratory failure with hypoxia: Secondary | ICD-10-CM | POA: Diagnosis present

## 2024-10-09 DIAGNOSIS — Z888 Allergy status to other drugs, medicaments and biological substances status: Secondary | ICD-10-CM

## 2024-10-09 DIAGNOSIS — E7849 Other hyperlipidemia: Secondary | ICD-10-CM | POA: Diagnosis present

## 2024-10-09 LAB — HEPATIC FUNCTION PANEL
ALT: 8 U/L (ref 0–44)
AST: 25 U/L (ref 15–41)
Albumin: 4 g/dL (ref 3.5–5.0)
Alkaline Phosphatase: 79 U/L (ref 38–126)
Bilirubin, Direct: <0.1 mg/dL (ref 0.0–0.2)
Total Bilirubin: 0.3 mg/dL (ref 0.0–1.2)
Total Protein: 7.6 g/dL (ref 6.5–8.1)

## 2024-10-09 LAB — CBC
HCT: 44.5 % (ref 36.0–46.0)
Hemoglobin: 14 g/dL (ref 12.0–15.0)
MCH: 27.2 pg (ref 26.0–34.0)
MCHC: 31.5 g/dL (ref 30.0–36.0)
MCV: 86.6 fL (ref 80.0–100.0)
Platelets: 250 K/uL (ref 150–400)
RBC: 5.14 MIL/uL — ABNORMAL HIGH (ref 3.87–5.11)
RDW: 13.1 % (ref 11.5–15.5)
WBC: 12.2 K/uL — ABNORMAL HIGH (ref 4.0–10.5)
nRBC: 0 % (ref 0.0–0.2)

## 2024-10-09 LAB — URINALYSIS, ROUTINE W REFLEX MICROSCOPIC
Bilirubin Urine: NEGATIVE
Glucose, UA: NEGATIVE mg/dL
Hgb urine dipstick: NEGATIVE
Ketones, ur: NEGATIVE mg/dL
Nitrite: NEGATIVE
Protein, ur: NEGATIVE mg/dL
Specific Gravity, Urine: 1.009 (ref 1.005–1.030)
WBC, UA: >50 WBC/hpf (ref 0–5)
pH: 6 (ref 5.0–8.0)

## 2024-10-09 LAB — BASIC METABOLIC PANEL WITH GFR
Anion gap: 12 (ref 5–15)
BUN: 29 mg/dL — ABNORMAL HIGH (ref 6–20)
CO2: 26 mmol/L (ref 22–32)
Calcium: 10.2 mg/dL (ref 8.9–10.3)
Chloride: 95 mmol/L — ABNORMAL LOW (ref 98–111)
Creatinine, Ser: 1.31 mg/dL — ABNORMAL HIGH (ref 0.44–1.00)
GFR, Estimated: 49 mL/min — ABNORMAL LOW
Glucose, Bld: 224 mg/dL — ABNORMAL HIGH (ref 70–99)
Potassium: 5.1 mmol/L (ref 3.5–5.1)
Sodium: 134 mmol/L — ABNORMAL LOW (ref 135–145)

## 2024-10-09 LAB — LIPASE, BLOOD: Lipase: 36 U/L (ref 11–51)

## 2024-10-09 LAB — RESP PANEL BY RT-PCR (RSV, FLU A&B, COVID)  RVPGX2
Influenza A by PCR: NEGATIVE
Influenza B by PCR: NEGATIVE
Resp Syncytial Virus by PCR: NEGATIVE
SARS Coronavirus 2 by RT PCR: NEGATIVE

## 2024-10-09 LAB — TROPONIN T, HIGH SENSITIVITY
Troponin T High Sensitivity: 24 ng/L — ABNORMAL HIGH (ref 0–19)
Troponin T High Sensitivity: 25 ng/L — ABNORMAL HIGH (ref 0–19)

## 2024-10-09 LAB — CBG MONITORING, ED: Glucose-Capillary: 138 mg/dL — ABNORMAL HIGH (ref 70–99)

## 2024-10-09 LAB — I-STAT CG4 LACTIC ACID, ED
Lactic Acid, Venous: 2.1 mmol/L (ref 0.5–1.9)
Lactic Acid, Venous: 1.3 mmol/L (ref 0.5–1.9)

## 2024-10-09 MED ORDER — ALBUTEROL SULFATE (2.5 MG/3ML) 0.083% IN NEBU: 3.0000 mL | INHALATION_SOLUTION | Freq: Four times a day (QID) | RESPIRATORY_TRACT | Status: DC | PRN

## 2024-10-09 MED ORDER — SODIUM CHLORIDE 0.9% FLUSH
3.0000 mL | Freq: Two times a day (BID) | INTRAVENOUS | Status: DC
  Administered 2024-10-10 – 2024-10-14 (×8): 3 mL via INTRAVENOUS

## 2024-10-09 MED ORDER — MELATONIN 5 MG PO TABS
5.0000 mg | ORAL_TABLET | Freq: Every day | ORAL | Status: DC
  Administered 2024-10-09 – 2024-10-13 (×5): 5 mg via ORAL
  Filled 2024-10-09 (×5): qty 1

## 2024-10-09 MED ORDER — ACETAMINOPHEN 325 MG PO TABS
650.0000 mg | ORAL_TABLET | Freq: Four times a day (QID) | ORAL | Status: DC | PRN
  Administered 2024-10-10 – 2024-10-13 (×2): 650 mg via ORAL
  Filled 2024-10-09 (×4): qty 2

## 2024-10-09 MED ORDER — LORAZEPAM 0.5 MG PO TABS: 0.5000 mg | ORAL_TABLET | Freq: Two times a day (BID) | ORAL | Status: DC | PRN

## 2024-10-09 MED ORDER — FLUOXETINE HCL 20 MG PO CAPS
20.0000 mg | ORAL_CAPSULE | Freq: Every day | ORAL | Status: DC
  Administered 2024-10-10 – 2024-10-14 (×5): 20 mg via ORAL
  Filled 2024-10-09 (×5): qty 1

## 2024-10-09 MED ORDER — ASPIRIN 81 MG PO CHEW
81.0000 mg | CHEWABLE_TABLET | Freq: Every morning | ORAL | Status: DC
  Administered 2024-10-10 – 2024-10-14 (×5): 81 mg via ORAL
  Filled 2024-10-09 (×5): qty 1

## 2024-10-09 MED ORDER — FLUTICASONE FUROATE-VILANTEROL 100-25 MCG/ACT IN AEPB
1.0000 | INHALATION_SPRAY | Freq: Every day | RESPIRATORY_TRACT | Status: DC
  Filled 2024-10-09: qty 28

## 2024-10-09 MED ORDER — INSULIN ASPART 100 UNIT/ML IJ SOLN
0.0000 [IU] | Freq: Every day | INTRAMUSCULAR | Status: DC
  Administered 2024-10-10: 2 [IU] via SUBCUTANEOUS
  Filled 2024-10-09: qty 2

## 2024-10-09 MED ORDER — ACETAMINOPHEN 650 MG RE SUPP: 650.0000 mg | Freq: Four times a day (QID) | RECTAL | Status: DC | PRN

## 2024-10-09 MED ORDER — IOHEXOL 350 MG/ML SOLN
75.0000 mL | Freq: Once | INTRAVENOUS | Status: AC | PRN
  Administered 2024-10-09: 75 mL via INTRAVENOUS

## 2024-10-09 MED ORDER — LOSARTAN POTASSIUM 25 MG PO TABS
37.5000 mg | ORAL_TABLET | Freq: Every day | ORAL | Status: DC
  Administered 2024-10-11 – 2024-10-14 (×4): 37.5 mg via ORAL
  Filled 2024-10-09 (×2): qty 1.5
  Filled 2024-10-09 (×4): qty 2

## 2024-10-09 MED ORDER — MORPHINE SULFATE (PF) 4 MG/ML IV SOLN
4.0000 mg | Freq: Once | INTRAVENOUS | Status: AC
  Administered 2024-10-09: 4 mg via INTRAVENOUS
  Filled 2024-10-09: qty 1

## 2024-10-09 MED ORDER — POLYETHYLENE GLYCOL 3350 17 G PO PACK: 17.0000 g | PACK | Freq: Every day | ORAL | Status: DC | PRN

## 2024-10-09 MED ORDER — ROPINIROLE HCL 0.25 MG PO TABS
0.2500 mg | ORAL_TABLET | Freq: Every day | ORAL | Status: DC
  Administered 2024-10-09 – 2024-10-13 (×5): 0.25 mg via ORAL
  Filled 2024-10-09 (×7): qty 1

## 2024-10-09 MED ORDER — QUETIAPINE FUMARATE 25 MG PO TABS
12.5000 mg | ORAL_TABLET | Freq: Every day | ORAL | Status: DC
  Administered 2024-10-09 – 2024-10-13 (×5): 12.5 mg via ORAL
  Filled 2024-10-09 (×5): qty 1

## 2024-10-09 MED ORDER — SODIUM CHLORIDE 0.9 % IV SOLN
100.0000 mg | Freq: Two times a day (BID) | INTRAVENOUS | Status: DC
  Administered 2024-10-10 – 2024-10-11 (×3): 100 mg via INTRAVENOUS
  Filled 2024-10-09 (×3): qty 100

## 2024-10-09 MED ORDER — ATORVASTATIN CALCIUM 80 MG PO TABS
80.0000 mg | ORAL_TABLET | Freq: Every day | ORAL | Status: DC
  Administered 2024-10-09 – 2024-10-13 (×5): 80 mg via ORAL
  Filled 2024-10-09 (×5): qty 1

## 2024-10-09 MED ORDER — LACTATED RINGERS IV BOLUS
500.0000 mL | Freq: Once | INTRAVENOUS | Status: AC
  Administered 2024-10-09: 500 mL via INTRAVENOUS

## 2024-10-09 MED ORDER — PANTOPRAZOLE SODIUM 40 MG PO TBEC
40.0000 mg | DELAYED_RELEASE_TABLET | Freq: Every day | ORAL | Status: DC
  Administered 2024-10-10 – 2024-10-14 (×5): 40 mg via ORAL
  Filled 2024-10-09 (×5): qty 1

## 2024-10-09 MED ORDER — ATENOLOL 50 MG PO TABS
50.0000 mg | ORAL_TABLET | Freq: Two times a day (BID) | ORAL | Status: DC
  Administered 2024-10-09 – 2024-10-14 (×10): 50 mg via ORAL
  Filled 2024-10-09 (×10): qty 1

## 2024-10-09 MED ORDER — INSULIN GLARGINE 100 UNIT/ML ~~LOC~~ SOLN
20.0000 [IU] | Freq: Every day | SUBCUTANEOUS | Status: AC
  Administered 2024-10-09 – 2024-10-10 (×2): 20 [IU] via SUBCUTANEOUS
  Filled 2024-10-09 (×2): qty 0.2

## 2024-10-09 MED ORDER — INSULIN ASPART 100 UNIT/ML IJ SOLN
0.0000 [IU] | Freq: Three times a day (TID) | INTRAMUSCULAR | Status: DC
  Administered 2024-10-10: 5 [IU] via SUBCUTANEOUS
  Administered 2024-10-10: 2 [IU] via SUBCUTANEOUS
  Administered 2024-10-11: 3 [IU] via SUBCUTANEOUS
  Administered 2024-10-11 – 2024-10-12 (×3): 2 [IU] via SUBCUTANEOUS
  Administered 2024-10-12: 3 [IU] via SUBCUTANEOUS
  Administered 2024-10-13 – 2024-10-14 (×3): 2 [IU] via SUBCUTANEOUS
  Administered 2024-10-14: 3 [IU] via SUBCUTANEOUS
  Filled 2024-10-09: qty 2
  Filled 2024-10-09: qty 5
  Filled 2024-10-09 (×5): qty 2
  Filled 2024-10-09: qty 3

## 2024-10-09 MED ORDER — SODIUM CHLORIDE 0.9 % IV SOLN
2.0000 g | INTRAVENOUS | Status: DC
  Administered 2024-10-09 – 2024-10-11 (×3): 2 g via INTRAVENOUS
  Filled 2024-10-09 (×3): qty 20

## 2024-10-09 MED ORDER — EZETIMIBE 10 MG PO TABS
10.0000 mg | ORAL_TABLET | Freq: Every day | ORAL | Status: DC
  Administered 2024-10-09 – 2024-10-13 (×5): 10 mg via ORAL
  Filled 2024-10-09 (×5): qty 1

## 2024-10-09 MED ORDER — GABAPENTIN 400 MG PO CAPS
400.0000 mg | ORAL_CAPSULE | Freq: Three times a day (TID) | ORAL | Status: DC
  Administered 2024-10-09 – 2024-10-14 (×14): 400 mg via ORAL
  Filled 2024-10-09 (×14): qty 1

## 2024-10-09 MED ORDER — LEVOTHYROXINE SODIUM 88 MCG PO TABS
88.0000 ug | ORAL_TABLET | Freq: Every day | ORAL | Status: DC
  Administered 2024-10-10 – 2024-10-14 (×5): 88 ug via ORAL
  Filled 2024-10-09 (×5): qty 1

## 2024-10-09 MED ORDER — FENTANYL CITRATE (PF) 50 MCG/ML IJ SOSY
50.0000 ug | PREFILLED_SYRINGE | Freq: Once | INTRAMUSCULAR | Status: AC
  Administered 2024-10-09: 50 ug via INTRAVENOUS
  Filled 2024-10-09: qty 1

## 2024-10-09 MED ORDER — SODIUM CHLORIDE 0.9 % IV SOLN
100.0000 mg | Freq: Once | INTRAVENOUS | Status: AC
  Administered 2024-10-09: 100 mg via INTRAVENOUS
  Filled 2024-10-09: qty 100

## 2024-10-09 MED ORDER — UMECLIDINIUM BROMIDE 62.5 MCG/ACT IN AEPB
1.0000 | INHALATION_SPRAY | Freq: Every day | RESPIRATORY_TRACT | Status: DC
  Filled 2024-10-09: qty 7

## 2024-10-09 MED ORDER — APIXABAN 5 MG PO TABS
5.0000 mg | ORAL_TABLET | Freq: Two times a day (BID) | ORAL | Status: DC
  Administered 2024-10-09 – 2024-10-14 (×10): 5 mg via ORAL
  Filled 2024-10-09 (×10): qty 1

## 2024-10-09 MED ORDER — SODIUM CHLORIDE 0.9 % IV SOLN
1.0000 g | Freq: Once | INTRAVENOUS | Status: AC
  Administered 2024-10-09: 1 g via INTRAVENOUS
  Filled 2024-10-09: qty 10

## 2024-10-09 NOTE — ED Provider Notes (Signed)
 I received the patient in signout with CT scans pending.  Patient had CT angiogram and CT abdomen pending in her workup.  She was noted to have urinary tract infection on initial urinalysis and was covered with Rocephin .  This is like she has pneumonia here and is additionally covered with doxycycline .  CT scan of her abdomen shows no real acute findings except for some air in the urinary bladder.  Patient is covered with antibiotics for UTI.  She did desat down to the low 80s.  Was placed on nasal cannula oxygen .  Calls placed to hospitalist service for admission for further treatment IV antibiotics. Physical Exam  BP 135/88   Pulse 70   Temp 98.5 F (36.9 C) (Esophageal)   Resp 11   Ht 5' 7.5 (1.715 m)   Wt 85.7 kg   LMP 10/20/2019   SpO2 98%   BMI 29.16 kg/m   Physical Exam General: No acute distress  Procedures  Procedures  ED Course / MDM    Medical Decision Making Amount and/or Complexity of Data Reviewed Labs: ordered. Radiology: ordered.  Risk Prescription drug management.          Ula Prentice SAUNDERS, MD 10/09/24 [phone]

## 2024-10-09 NOTE — ED Triage Notes (Signed)
 Pt bib ems from [place] c/o CP with jaw pain. Pt was sitting in her chair when she had sudden right chest pain sharp in nature. Pt endorses sob, back pain, and left side arm pain.  Pt denies N/V  Hx CVA (Left side deficit)  VSS

## 2024-10-09 NOTE — ED Notes (Signed)
   Pt transported to ct

## 2024-10-09 NOTE — H&P (Addendum)
 " History and Physical   Karla Hoover FMW:[id number] DOB: December 27, 1970 DOA: 10/09/2024  PCP: Pcp, No   Patient coming from: [facility] Complaint: Chest pain, shortness of breath  HPI: Karla Hoover is a 54 y.o. female with medical history significant of hypertension, hyperlipidemia, diabetes, CKD 3A, GERD, QTc prolongation, hypothyroidism, CVA with left hemiparesis, diastolic CHF, depression, anxiety, ESBL UTI, chronic respiratory failure with hypoxia, COPD, obesity, DVT, PE presenting with chest pain shortness of breath.  Past several days patient has had some intermittent left-sided chest pain that has waxed and waned.  Pain has been worse with inhalation/pleuritic.  Pain at times radiate from left shoulder area/chest into patient's neck and jaw.  Patient reports some recent upper respiratory tract infection symptoms.  Also reports that her legs have been doing feeling cold recently which, though unusual, that is how she feels prior to previous UTIs.  Denies any dysuria.  Further denies fevers, chills, abdominal pain, constipation, diarrhea, nausea.  ED Course: Vital signs in the ED notable for blood pressure in the 100s-150 systolic, requiring 2 L to maintain saturations after desaturation into the 80s on room air.  Lab work in the ED notable for BMP with sodium 134, chloride 95, BUN 29, creatinine 1.31 which is near baseline at 1.1, glucose 224.  LFTs normal.  CBC with leukocytosis to 12.2.  Troponin flat at 24, 25.  Lactic acid initially mildly elevated 2.1, improved to 1.3 on repeat.  Lipase normal.  Respiratory panel for flu COVID and RSV normal.  Urinalysis with leukocytes and few bacteria.  Urine culture pending.  Chest x-ray showed no acute abnormality.  CTA PE study showed no evidence of PE but did show emphysema with bronchial wall thickening.  Also showed left lower lobe nodule or nodule like density which could represent infection versus inflammation follow-up  recommended.  CT abdomen pelvis also obtained which showed no acute abnormality but did show some small gas in the bladder.  A renal lesion was redemonstrated with continued follow-up recommended  Patient received fentanyl , morphine , ceftriaxone , doxycycline  in the ED.  Review of Systems: As per HPI otherwise all other systems reviewed and are negative.  Past Medical History:  Diagnosis Date   Acute metabolic encephalopathy 09/06/2023   AKI (acute kidney injury) 08/30/2022   Allergies    Asthma    Bilateral pulmonary embolism (HCC) 11/15/2019   Bipolar disorder (HCC)    Chronic kidney disease, stage 3a (HCC) 02/09/2021   Chronic respiratory failure with hypoxia (HCC) 02/09/2021   COPD (chronic obstructive pulmonary disease) (HCC)    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   Former smoker 36 pack year smoking history     Diabetes mellitus without complication (HCC)    Essential hypertension 05/04/2009   Qualifier: Diagnosis of  By: Lang CMA, Jessica     GERD without esophagitis 02/09/2021   Grade I diastolic dysfunction 12/02/2021   Hypertension    Hypothyroidism 02/09/2021   Mixed 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 (HCC) 02/09/2021   Pulmonary embolism (HCC) 11/16/2019   Sepsis secondary to UTI (HCC) 02/08/2021   Stroke (HCC)    Type 2 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 disease, with long-term current use of insulin  (HCC) 02/09/2021    Past Surgical History:  Procedure Laterality Date   TRANSESOPHAGEAL ECHOCARDIOGRAM (CATH LAB) N/A 10/23/2023   Procedure: TRANSESOPHAGEAL ECHOCARDIOGRAM;  Surgeon: Sheena Pugh, DO;  Location: [facility];  Service: Cardiovascular;  Laterality: N/A;  Social History  reports that she quit smoking about 4 years ago. Her smoking use included cigarettes. She started smoking about 41 years ago. She has a 18.1 pack-year smoking history. She has never used smokeless tobacco. She reports that  she does not drink alcohol and does not use drugs.  Allergies[1]  Family History  Problem Relation Age of Onset   Diabetes Mother    COPD Sister    Heart failure Sister    Breast cancer Sister   Reviewed on admission  Prior to Admission medications  Medication Sig Start Date End Date Taking? Authorizing Provider  acetaminophen  (TYLENOL ) 500 MG tablet Take 1,000 mg by mouth 3 (three) times daily.    [provider]  albuterol  (VENTOLIN  HFA) 108 (90 Base) MCG/ACT inhaler Inhale 2 puffs into the lungs every 6 (six) hours as needed for wheezing or shortness of breath. Patient not taking: Reported on 11/11/2023 10/04/19   Celestia Rosaline SQUIBB, NP  apixaban  (ELIQUIS ) 5 MG TABS tablet Take 1 tablet (5mg ) twice daily Patient taking differently: Take 5 mg by mouth 2 (two) times daily. 09/10/22   Cindy Garnette POUR, MD  aspirin  81 MG chewable tablet Chew 81 mg by mouth in the morning.    [provider]  atenolol  (TENORMIN ) 50 MG tablet Take 50 mg by mouth 2 (two) times daily.    [provider]  atorvastatin  (LIPITOR ) 80 MG tablet Take 1 tablet (80 mg total) by mouth daily. Patient taking differently: Take 80 mg by mouth at bedtime. 10/04/19   Celestia Rosaline SQUIBB, NP  barrier cream (NON-SPECIFIED) CREA Apply 1 Application topically See admin instructions. Apply to affected areas 2 times a day    [provider]  cefdinir  (OMNICEF ) 300 MG capsule Take 1 capsule (300 mg total) by mouth 2 (two) times daily. 06/02/24   Griselda Norris, MD  chlorhexidine  (PERIDEX ) 0.12 % solution Use as directed 15 mLs in the mouth or throat See admin instructions. Rinse the mouth with 15 ml's once a day as directed, then spit out    [provider]  Cholecalciferol  (VITAMIN D3) 1.25 MG (50000 UT) CAPS Take 50,000 Units by mouth every Thursday.    [provider]  clonazePAM  (KLONOPIN ) 0.5 MG tablet Take 1 tablet (0.5 mg total) by mouth 2 (two) times daily for 4 doses.  11/14/23 11/16/23  Christobal Guadalajara, MD  cyclobenzaprine  (FLEXERIL ) 10 MG tablet Take 1 tablet (10 mg total) by mouth 3 (three) times daily as needed for muscle spasms. 10/04/19   Celestia Rosaline SQUIBB, NP  EPINEPHrine (EPIPEN 2-PAK) 0.3 mg/0.3 mL IJ SOAJ injection Inject 0.3 mg into the muscle daily as needed for anaphylaxis.    [provider]  ezetimibe  (ZETIA ) 10 MG tablet Take 10 mg by mouth at bedtime.    [provider]  FLUoxetine  (PROZAC ) 20 MG capsule Take 20 mg by mouth daily.    [provider]  fluticasone -salmeterol (ADVAIR) 100-50 MCG/ACT AEPB Inhale 1 puff into the lungs 2 (two) times daily.    [provider]  gabapentin  (NEURONTIN ) 400 MG capsule Take 1 capsule (400 mg total) by mouth 3 (three) times daily. 10/24/23   Krishnan, Gokul, MD  hydrALAZINE  (APRESOLINE ) 10 MG tablet Take 10 mg by mouth every 12 (twelve) hours as needed (for a Systolic reading >839 or Diastolic reading >04).    [provider]  INCRUSE ELLIPTA  62.5 MCG/ACT AEPB Inhale 1 puff into the lungs daily.    [provider]  insulin  glargine (LANTUS  SOLOSTAR) 100 UNIT/ML Solostar Pen Inject 15 Units into the skin at bedtime.    [provider]  insulin  lispro (HUMALOG ) 100 UNIT/ML injection Inject 0-14 Units into the skin See admin instructions. Inject 0-14 units twice daily as per sliding scale : CBG 0-200 : 0 units CBG 201-250 : 2 units CBG 251-300 : 4 units CBG 301-350 : 6 units CBG 351-400 : 8 units CBG 401-450 : 10 units CBG 451-500 : 12 units (if BP reads high, inject 14 units)    [provider]  LACTOBACILLUS PO Take 1 capsule by mouth in the morning and at bedtime.    [provider]  levothyroxine  (SYNTHROID ) 88 MCG tablet Take 88 mcg by mouth in the morning.    [provider]  losartan  (COZAAR ) 25 MG tablet Take 37.5 mg by mouth daily.    [provider]  MACROBID  100 MG capsule Take 100 mg by mouth in the morning.     [provider]  Melatonin 5 MG TBDP Take 5 mg by mouth at bedtime.    [provider]  Omega-3 Fatty Acids (FISH OIL) 1000 MG CAPS Take 1,000 mg by mouth at bedtime.    [provider]  omeprazole  (PRILOSEC) 20 MG capsule Take 20 mg by mouth in the morning.    [provider]  ondansetron  (ZOFRAN ) 4 MG tablet Take 4 mg by mouth every 8 (eight) hours as needed for nausea or vomiting.    [provider]  OXYGEN  Inhale 2 L/min into the lungs as needed (for shortness of breath).    [provider]  polyethylene glycol powder (GLYCOLAX /MIRALAX ) 17 GM/SCOOP powder Take 17 g by mouth daily.    [provider]  Propyl Glycol-Hydroxyethylcell (NASAL MOIST) GEL Place 1 application  into both nostrils at bedtime.    [provider]  QUEtiapine  (SEROQUEL ) 25 MG tablet Take 1 tablet (25 mg total) by mouth at bedtime. 10/24/23   Krishnan, Gokul, MD  rOPINIRole  (REQUIP ) 0.25 MG tablet Take 1 tablet (0.25 mg total) by mouth at bedtime. 10/24/23   Krishnan, Gokul, MD  senna-docusate (SENOKOT-S) 8.6-50 MG tablet Take 1 tablet by mouth 2 (two) times daily. 10/24/23   Krishnan, Gokul, MD  TRULICITY  1.5 MG/0.5ML SOAJ Inject 1.5 mg into the skin every Friday.    [provider]  VOLTAREN  1 % GEL Apply 2 g topically See admin instructions. Apply 2 grams to the posterior neck  2 times a day    [provider]  witch hazel-glycerin (TUCKS) pad Apply 1 Application topically every 4 (four) hours as needed for hemorrhoids.    [provider]    Physical Exam: Vitals:   10/09/24 1415 10/09/24 1704 10/09/24 1719 10/09/24 1723  BP: 135/88     Pulse: 70  69 68  Resp: 11  (!) 9 14  Temp:  97.7 F (36.5 C) 97.7 F (36.5 C) 97.7 F (36.5 C)  TempSrc:  Oral  Oral  SpO2: 98%  100% 94%  Weight:      Height:        Physical Exam Constitutional:      General: She is not in acute distress.    Appearance: Normal appearance.   HENT:     Head: Normocephalic and atraumatic.     Mouth/Throat:     Mouth: Mucous membranes are moist.     Pharynx: Oropharynx is clear.  Eyes:     Extraocular Movements: Extraocular movements  intact.     Pupils: Pupils are equal, round, and reactive to light.  Cardiovascular:     Rate and Rhythm: Normal rate and regular rhythm.     Pulses: Normal pulses.     Heart sounds: Normal heart sounds.  Pulmonary:     Effort: Pulmonary effort is normal. No respiratory distress.     Breath sounds: Normal breath sounds.  Abdominal:     General: Bowel sounds are normal. There is no distension.     Palpations: Abdomen is soft.     Tenderness: There is no abdominal tenderness.  Musculoskeletal:        General: No swelling or deformity.  Skin:    General: Skin is warm and dry.  Neurological:     Mental Status: Mental status is at baseline.     Comments: Chronic left-sided deficits    Labs on Admission: I have personally reviewed following labs and imaging studies  CBC: Recent Labs  Lab 10/09/24 0800  WBC 12.2*  HGB 14.0  HCT 44.5  MCV 86.6  PLT 250    Basic Metabolic Panel: Recent Labs  Lab 10/09/24 0800  NA 134*  K 5.1  CL 95*  CO2 26  GLUCOSE 224*  BUN 29*  CREATININE 1.31*  CALCIUM  10.2    GFR: Estimated Creatinine Clearance: 57.1 mL/min (A) (by C-G formula based on SCr of 1.31 mg/dL (H)).  Liver Function Tests: Recent Labs  Lab 10/09/24 1256  AST 25  ALT 8  ALKPHOS 79  BILITOT 0.3  PROT 7.6  ALBUMIN 4.0    Urine analysis:    Component Value Date/Time   COLORURINE YELLOW 10/09/2024 0822   APPEARANCEUR CLOUDY (A) 10/09/2024 0822   APPEARANCEUR Clear 11/24/2012 1238   LABSPEC 1.009 10/09/2024 0822   LABSPEC 1.000 11/24/2012 1238   PHURINE 6.0 10/09/2024 0822   GLUCOSEU NEGATIVE 10/09/2024 0822   GLUCOSEU >=500 11/24/2012 1238   HGBUR NEGATIVE 10/09/2024 0822   BILIRUBINUR NEGATIVE 10/09/2024 0822   BILIRUBINUR Negative 11/24/2012 1238    KETONESUR NEGATIVE 10/09/2024 0822   PROTEINUR NEGATIVE 10/09/2024 0822   UROBILINOGEN 1.0 04/30/2010 0902   NITRITE NEGATIVE 10/09/2024 0822   LEUKOCYTESUR LARGE (A) 10/09/2024 0822   LEUKOCYTESUR Negative 11/24/2012 1238    Radiological Exams on Admission: CT ABDOMEN PELVIS W CONTRAST Result Date: 10/09/2024 CLINICAL DATA:  Abdominal pain EXAM: CT ABDOMEN AND PELVIS WITH CONTRAST TECHNIQUE: Multidetector CT imaging of the abdomen and pelvis was performed using the standard protocol following bolus administration of intravenous contrast. RADIATION DOSE REDUCTION: This exam was performed according to the departmental dose-optimization program which includes automated exposure control, adjustment of the mA and/or kV according to patient size and/or use of iterative reconstruction technique. CONTRAST:  75mL OMNIPAQUE  IOHEXOL  350 MG/ML SOLN COMPARISON:  MRI 02/17/2024, CT 10/21/2023 FINDINGS: Lower chest: Lung bases demonstrate focal nodular density in the subpleural left lower lobe. See separately dictated chest CT Hepatobiliary: No focal liver abnormality is seen. No gallstones, gallbladder wall thickening, or biliary dilatation. Pancreas: Unremarkable. No pancreatic ductal dilatation or surrounding inflammatory changes. Spleen: Normal in size without focal abnormality. Adrenals/Urinary Tract: Adrenal glands are normal. No hydronephrosis. Cortical scarring and atrophy of the right kidney. Cortical scarring of the upper pole left kidney. Focal hypodensity right kidney best seen on coronal series 7, image 37 measuring 10 mm, appears more low-density today, on the prior exam measured 16 mm. Bladder is unremarkable except for small focus of gas Stomach/Bowel: Stomach within normal limits. No dilated small bowel.  No acute bowel wall thickening. Negative appendix Vascular/Lymphatic: Aortic atherosclerosis. No enlarged abdominal or pelvic lymph nodes. Reproductive: Uterus and bilateral adnexa are unremarkable.  Other: No ascites or free air Musculoskeletal: No acute or suspicious osseous abnormality IMPRESSION: 1. No CT evidence for acute intra-abdominal or pelvic abnormality. 2. Cortical scarring and atrophy of the right kidney. Cortical scarring of the upper pole left kidney. Focal hypodensity in the right kidney appears more low-density today and less conspicuous than on the prior exam, reference MRI 02/17/2024 for additional follow-up recommendations 3. Small focus of gas in the urinary bladder, correlate for recent instrumentation. 4. Aortic atherosclerosis. Aortic Atherosclerosis (ICD10-I70.0). Electronically Signed   By: Luke Bun M.D.   On: 10/09/2024 15:39   CT Angio Chest PE W and/or Wo Contrast Result Date: 10/09/2024 CLINICAL DATA:  Abdominal pain chest pain EXAM: CT ANGIOGRAPHY CHEST WITH CONTRAST TECHNIQUE: Multidetector CT imaging of the chest was performed using the standard protocol during bolus administration of intravenous contrast. Multiplanar CT image reconstructions and MIPs were obtained to evaluate the vascular anatomy. RADIATION DOSE REDUCTION: This exam was performed according to the departmental dose-optimization program which includes automated exposure control, adjustment of the mA and/or kV according to patient size and/or use of iterative reconstruction technique. CONTRAST:  75mL OMNIPAQUE  IOHEXOL  350 MG/ML SOLN COMPARISON:  Chest x-ray 10/09/2024, chest CT 06/02/2024 FINDINGS: Cardiovascular: Satisfactory opacification of the pulmonary arteries to the segmental level. No evidence of pulmonary embolism. Mild atherosclerosis. Negative for aortic aneurysm. Multi vessel coronary vascular calcification. Normal cardiac size. No pericardial effusion Mediastinum/Nodes: Patent trachea. No suspicious thyroid mass. No suspicious lymph nodes. Esophagus within normal limits. Lungs/Pleura: No acute airspace disease. Mild emphysema. Bilateral bronchial wall thickening. Subpleural left lower lobe  nodule or nodular focus of consolidation with mild surrounding ground-glass disease measuring up to 12 mm, series 5, image 115. Upper Abdomen: See separately dictated abdominopelvic CT Musculoskeletal: No acute osseous abnormality Review of the MIP images confirms the above findings. IMPRESSION: 1. Negative for acute pulmonary embolus. 2. Emphysema with bilateral bronchial wall thickening. 3. 12 mm subpleural left lower lobe nodule or nodular focus of consolidation with mild surrounding ground-glass disease. This may be infectious or inflammatory, but recommend CT chest follow-up in 3 months to ensure resolution. 4. Aortic atherosclerosis. Aortic Atherosclerosis (ICD10-I70.0) and Emphysema (ICD10-J43.9). Electronically Signed   By: Luke Bun M.D.   On: 10/09/2024 15:17   DG Chest 2 View Result Date: 10/09/2024 EXAM: 2 VIEW(S) XRAY OF THE CHEST 10/09/2024 08:39:00 AM COMPARISON: 06/02/2024 CLINICAL HISTORY: Chest pain and jaw pain. FINDINGS: LUNGS AND PLEURA: No focal pulmonary opacity. No pleural effusion. No pneumothorax. HEART AND MEDIASTINUM: No acute abnormality of the cardiac and mediastinal silhouettes. BONES AND SOFT TISSUES: Multilevel degenerative changes of thoracic spine. IMPRESSION: 1. No acute cardiopulmonary abnormality. Electronically signed by: Waddell Calk MD 10/09/2024 08:49 AM EST RP Workstation: [id number]   EKG: Independently reviewed. Sinus rhythm at 75 beats minute.  Nonspecific T wave changes.  Low voltage multiple leads.  Minimal baseline wander. QTc okay at 462.  Assessment/Plan Active Problems:   Type 2 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 disease, with long-term current use of insulin  (HCC)   Mixed 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 (HCC)   Chronic respiratory failure with hypoxia (HCC)   COPD with chronic bronchitis (HCC)   Essential hypertension   Hemiparesis affecting left side as late effect of cerebrovascular accident (CVA)  (HCC)   hx of PE/DVT   Grade I diastolic  dysfunction   CKD (chronic kidney disease) stage 3, GFR 30-59 ml/min (HCC)   GERD without esophagitis   Hypothyroidism   History of stroke   Class 1 obesity due to excess calories with body mass index (BMI) of 32.0 to 32.9 in adult   Acute on chronic respiratory failure with hypoxia (HCC)   Anxiety   Depression   CAP (community acquired pneumonia)   Acute on chronic respiratory failure with hypoxia CAP > Patient presenting with 3 days of chest pain shortness of breath.  Chest pain is pleuritic in nature and on the left side of her chest and radiates to her chest towards her neck and jaw. > Troponin was flat in the ED at 24, 25.  Chest x-ray showed no acute abnormality.  CT PE study showed no emphysema and bronchitis with possible nodular density which may represent pneumonia.  Leukocytosis to 12.2. > Patient started on ceftriaxone  and doxycycline  to cover for pneumonia and possible UTI as below. > Previously patient has required 2 L of oxygen   with exertion and at night.  Patient was at rest in the ED and became hypoxic to the mid 80s and was placed on 2 L supplemental oxygen  with improvement. - Monitor on telemetry - Continue supplemental oxygen , wean as tolerated - Continue with ceftriaxone  and doxycycline  - Trend fever curve and WBC  ?UTI > History of prior UTI including ESBL UTI.  Prior to previous UTIs she gets sensation of coldness in her legs which she has been having recently.  This is atypical but has been a consistent precursor to UTI. > Urinalysis mildly suspicious for UTI with leukocytes and bacteria but no nitrates. - Started on antibiotics as above - Trend fever curve WBC - Follow-up urine culture  Hypertension - Continue home atenolol , Losartan   Hyperlipidemia - Continue home atorvastatin , Zetia   DM > 38U long-acting insulin  nightly at facility - 20 units long-acting insulin  nightly - SSI  CKD 3A > Creatinine mildly  elevated from baseline at 1.31  (baseline 1.1) - 0.5L IVF - Trend renal function and electrolytes  Hypothyroidism - Continue home Synthroid   History of CVA > With residual left hemiparesis. - Continue home aspirin , atorvastatin , Zetia  - Continue Eliquis   History of DVT and PE - Continue Eliquis   Chronic diastolic CHF > Last echo was in January 2025 and showed EF 55-60%, G1 DD, normal RV function. - Not currently on diuretic  Depression Anxiety - Continue home fluoxetine  and Seroquel   COPD - Continue home Incruse - Replace home Advair with formulary Breo - Continue as needed albuterol   Obesity - Noted  DVT prophylaxis: Eliquis  Code Status:   Full Family Communication:  Updated at bedside Disposition Plan:   Patient is from:  [place] DC to:  Same as above  Anticipated DC date:  1 to 3 days  Anticipated DC barriers: None  Consults called:  None Admission status:  Observation, telemetry  Severity of Illness: The appropriate patient status for this patient is OBSERVATION. Observation status is judged to be reasonable and necessary in order to provide the required intensity of service to ensure the patient's safety. The patient's presenting symptoms, physical exam findings, and initial radiographic and laboratory data in the context of their medical condition is felt to place them at decreased risk for further clinical deterioration. Furthermore, it is anticipated that the patient will be medically stable for discharge from the hospital within 2 midnights of admission.    Marsa KATHEE Scurry MD [facility]  How to contact the [facility] Attending or Consulting provider 7A - 7P or covering provider during after hours 7P -7A, for this patient?   Check the care team in [facility] and look for a) attending/consulting [facility] provider listed and b) the [facility] team listed Log into [email or web address] and use [place]'s universal password to access. If you do not have the  password, please contact the hospital operator. Locate the [facility] provider you are looking for under [facility] and page to a number that you can be directly reached. If you still have difficulty reaching the provider, please page the [facility] (Director on Call) for the Hospitalists listed on amion for assistance.  10/09/2024, 6:22 PM       [1]  Allergies Allergen Reactions   Cipro  [Ciprofloxacin  Hcl] Hives   Guaifenesin  Anaphylaxis   Kiwi Extract Anaphylaxis and Rash   Levaquin [Levofloxacin] Shortness Of Breath and Rash   Strawberry Extract Anaphylaxis and Rash   Lioresal [Baclofen] Other (See Comments)    Near syncope/ fall   Vancomycin  Itching   "

## 2024-10-09 NOTE — ED Provider Notes (Signed)
 " [place] EMERGENCY DEPARTMENT AT [facility] Provider Note   CSN: [id number] Arrival date & time: 10/09/24  9251     Patient presents with: Chest Pain   Karla Hoover is a 54 y.o. female.   The history is provided by the patient and medical records. No language interpreter was used.  Chest Pain Pain location:  L chest, L lateral chest, epigastric and substernal area Pain quality: aching, crushing and pressure   Pain radiates to:  L jaw, neck and L shoulder Pain severity:  Severe Onset quality:  Gradual Duration:  3 days Timing:  Constant Progression:  Waxing and waning Chronicity:  Recurrent Context: breathing   Relieved by:  Nothing Worsened by:  Coughing and deep breathing Ineffective treatments:  None tried Associated symptoms: abdominal pain, cough, fatigue and shortness of breath   Associated symptoms: no back pain, no fever, no headache, no nausea, no numbness, no palpitations, no vomiting and no weakness        Prior to Admission medications  Medication Sig Start Date End Date Taking? Authorizing Provider  acetaminophen  (TYLENOL ) 500 MG tablet Take 1,000 mg by mouth 3 (three) times daily.    [provider]  albuterol  (VENTOLIN  HFA) 108 (90 Base) MCG/ACT inhaler Inhale 2 puffs into the lungs every 6 (six) hours as needed for wheezing or shortness of breath. Patient not taking: Reported on 11/11/2023 10/04/19   Celestia Rosaline SQUIBB, NP  apixaban  (ELIQUIS ) 5 MG TABS tablet Take 1 tablet (5mg ) twice daily Patient taking differently: Take 5 mg by mouth 2 (two) times daily. 09/10/22   Cindy Garnette POUR, MD  aspirin  81 MG chewable tablet Chew 81 mg by mouth in the morning.    [provider]  atenolol  (TENORMIN ) 50 MG tablet Take 50 mg by mouth 2 (two) times daily.    [provider]  atorvastatin  (LIPITOR ) 80 MG tablet Take 1 tablet (80 mg total) by mouth daily. Patient taking differently: Take 80 mg by mouth at bedtime. 10/04/19    Celestia Rosaline SQUIBB, NP  barrier cream (NON-SPECIFIED) CREA Apply 1 Application topically See admin instructions. Apply to affected areas 2 times a day    [provider]  cefdinir  (OMNICEF ) 300 MG capsule Take 1 capsule (300 mg total) by mouth 2 (two) times daily. 06/02/24   Griselda Norris, MD  chlorhexidine  (PERIDEX ) 0.12 % solution Use as directed 15 mLs in the mouth or throat See admin instructions. Rinse the mouth with 15 ml's once a day as directed, then spit out    [provider]  Cholecalciferol  (VITAMIN D3) 1.25 MG (50000 UT) CAPS Take 50,000 Units by mouth every Thursday.    [provider]  clonazePAM  (KLONOPIN ) 0.5 MG tablet Take 1 tablet (0.5 mg total) by mouth 2 (two) times daily for 4 doses. 11/14/23 11/16/23  Christobal Guadalajara, MD  cyclobenzaprine  (FLEXERIL ) 10 MG tablet Take 1 tablet (10 mg total) by mouth 3 (three) times daily as needed for muscle spasms. 10/04/19   Celestia Rosaline SQUIBB, NP  EPINEPHrine (EPIPEN 2-PAK) 0.3 mg/0.3 mL IJ SOAJ injection Inject 0.3 mg into the muscle daily as needed for anaphylaxis.    [provider]  ezetimibe  (ZETIA ) 10 MG tablet Take 10 mg by mouth at bedtime.    [provider]  FLUoxetine  (PROZAC ) 20 MG capsule Take 20 mg by mouth daily.    [provider]  fluticasone -salmeterol (ADVAIR) 100-50 MCG/ACT AEPB Inhale 1 puff into the lungs 2 (two)  times daily.    [provider]  gabapentin  (NEURONTIN ) 400 MG capsule Take 1 capsule (400 mg total) by mouth 3 (three) times daily. 10/24/23   Krishnan, Gokul, MD  hydrALAZINE  (APRESOLINE ) 10 MG tablet Take 10 mg by mouth every 12 (twelve) hours as needed (for a Systolic reading >839 or Diastolic reading >04).    [provider]  INCRUSE ELLIPTA  62.5 MCG/ACT AEPB Inhale 1 puff into the lungs daily.    [provider]  insulin  glargine (LANTUS  SOLOSTAR) 100 UNIT/ML Solostar Pen Inject 15 Units into the skin at bedtime.    [provider]  insulin  lispro (HUMALOG ) 100 UNIT/ML injection Inject 0-14 Units into the skin See admin instructions. Inject 0-14 units twice daily as per sliding scale : CBG 0-200 : 0 units CBG 201-250 : 2 units CBG 251-300 : 4 units CBG 301-350 : 6 units CBG 351-400 : 8 units CBG 401-450 : 10 units CBG 451-500 : 12 units (if BP reads high, inject 14 units)    [provider]  LACTOBACILLUS PO Take 1 capsule by mouth in the morning and at bedtime.    [provider]  levothyroxine  (SYNTHROID ) 88 MCG tablet Take 88 mcg by mouth in the morning.    [provider]  losartan  (COZAAR ) 25 MG tablet Take 37.5 mg by mouth daily.    [provider]  MACROBID  100 MG capsule Take 100 mg by mouth in the morning.    [provider]  Melatonin 5 MG TBDP Take 5 mg by mouth at bedtime.    [provider]  Omega-3 Fatty Acids (FISH OIL) 1000 MG CAPS Take 1,000 mg by mouth at bedtime.    [provider]  omeprazole  (PRILOSEC) 20 MG capsule Take 20 mg by mouth in the morning.    [provider]  ondansetron  (ZOFRAN ) 4 MG tablet Take 4 mg by mouth every 8 (eight) hours as needed for nausea or vomiting.    [provider]  OXYGEN  Inhale 2 L/min into the lungs as needed (for shortness of breath).    [provider]  polyethylene glycol powder (GLYCOLAX /MIRALAX ) 17 GM/SCOOP powder Take 17 g by mouth daily.    [provider]  Propyl Glycol-Hydroxyethylcell (NASAL MOIST) GEL Place 1 application  into both nostrils at bedtime.    [provider]  QUEtiapine  (SEROQUEL ) 25 MG tablet Take 1 tablet (25 mg total) by mouth at bedtime. 10/24/23   Krishnan, Gokul, MD  rOPINIRole  (REQUIP ) 0.25 MG tablet Take 1 tablet (0.25 mg total) by mouth at bedtime. 10/24/23   Krishnan, Gokul, MD  senna-docusate (SENOKOT-S) 8.6-50 MG tablet Take 1 tablet by mouth 2 (two) times daily. 10/24/23   Krishnan, Gokul, MD  TRULICITY  1.5  MG/0.5ML EMMANUEL Inject 1.5 mg into the skin every Friday.    [provider]  VOLTAREN  1 % GEL Apply 2 g topically See admin instructions. Apply 2 grams to the posterior neck  2 times a day    [provider]  witch hazel-glycerin (TUCKS) pad Apply 1 Application topically every 4 (four) hours as needed for hemorrhoids.    [provider]    Allergies: Cipro  [ciprofloxacin  hcl], Guaifenesin , Kiwi extract, Levaquin [levofloxacin], Strawberry extract, Lioresal [baclofen], and Vancomycin     Review of Systems  Constitutional:  Positive for fatigue. Negative for chills and fever.  HENT:  Positive for congestion.   Respiratory:  Positive for cough, chest tightness and shortness of breath. Negative for  wheezing.   Cardiovascular:  Positive for chest pain. Negative for palpitations and leg swelling.  Gastrointestinal:  Positive for abdominal pain. Negative for constipation, diarrhea, nausea and vomiting.  Genitourinary:  Negative for dysuria.  Musculoskeletal:  Negative for back pain, neck pain and neck stiffness.  Skin:  Negative for rash and wound.  Neurological:  Negative for weakness, light-headedness, numbness and headaches.  Psychiatric/Behavioral:  Negative for agitation and confusion.   All other systems reviewed and are negative.   Updated Vital Signs BP (!) 152/77   Pulse 77   Temp 98 F (36.7 C) (Oral)   Resp 16   Ht 5' 7.5 (1.715 m)   Wt 85.7 kg   LMP 10/20/2019   SpO2 99%   BMI 29.16 kg/m   Physical Exam Vitals and nursing note reviewed.  Constitutional:      General: She is not in acute distress.    Appearance: She is well-developed. She is not ill-appearing, toxic-appearing or diaphoretic.  HENT:     Head: Normocephalic and atraumatic.     Nose: Congestion present. No rhinorrhea.     Mouth/Throat:     Mouth: Mucous membranes are moist.     Pharynx: No oropharyngeal exudate or posterior oropharyngeal erythema.  Eyes:     Extraocular  Movements: Extraocular movements intact.     Conjunctiva/sclera: Conjunctivae normal.     Pupils: Pupils are equal, round, and reactive to light.  Cardiovascular:     Rate and Rhythm: Normal rate and regular rhythm.     Heart sounds: No murmur heard. Pulmonary:     Effort: Pulmonary effort is normal. No respiratory distress.     Breath sounds: Rhonchi present. No wheezing or rales.  Chest:     Chest wall: Tenderness present.  Abdominal:     Palpations: Abdomen is soft.     Tenderness: There is abdominal tenderness. There is no guarding or rebound.  Musculoskeletal:        General: No swelling or tenderness.     Cervical back: Neck supple.     Right lower leg: No edema.     Left lower leg: No edema.  Skin:    General: Skin is warm and dry.     Capillary Refill: Capillary refill takes less than 2 seconds.     Findings: No erythema or rash.  Neurological:     General: No focal deficit present.     Mental Status: She is alert.  Psychiatric:        Mood and Affect: Mood normal.     (all labs ordered are listed, but only abnormal results are displayed) Labs Reviewed  BASIC METABOLIC PANEL WITH GFR - Abnormal; Notable for the following components:      Result Value   Sodium 134 (*)    Chloride 95 (*)    Glucose, Bld 224 (*)    BUN 29 (*)    Creatinine, Ser 1.31 (*)    GFR, Estimated 49 (*)    All other components within normal limits  CBC - Abnormal; Notable for the following components:   WBC 12.2 (*)    RBC 5.14 (*)    All other components within normal limits  URINALYSIS, ROUTINE W REFLEX MICROSCOPIC - Abnormal; Notable for the following components:   APPearance CLOUDY (*)    Leukocytes,Ua LARGE (*)    Bacteria, UA FEW (*)    All other components within normal limits  I-STAT CG4 LACTIC ACID, ED - Abnormal; Notable for the following  components:   Lactic Acid, Venous 2.1 (*)    All other components within normal limits  TROPONIN T, HIGH SENSITIVITY - Abnormal; Notable  for the following components:   Troponin T High Sensitivity 24 (*)    All other components within normal limits  TROPONIN T, HIGH SENSITIVITY - Abnormal; Notable for the following components:   Troponin T High Sensitivity 25 (*)    All other components within normal limits  RESP PANEL BY RT-PCR (RSV, FLU A&B, COVID)  [id number]  URINE CULTURE  HEPATIC FUNCTION PANEL  LIPASE, BLOOD  I-STAT CG4 LACTIC ACID, ED    EKG: EKG Interpretation Date/Time:  Saturday October 09 2024 08:07:42 EST Ventricular Rate:  75 PR Interval:  180 QRS Duration:  76 QT Interval:  414 QTC Calculation: 462 R Axis:   56  Text Interpretation: Normal sinus rhythm Low voltage QRS Cannot rule out Anterior infarct , age undetermined Abnormal ECG When compared with ECG of 02-Jun-2024 00:26, PREVIOUS ECG IS PRESENT when compared to prior, overall similar appearance No STEMI Confirmed by Ginger Barefoot ([id number]) on 10/09/2024 12:28:40 PM  Radiology: CT Angio Chest PE W and/or Wo Contrast Result Date: 10/09/2024 CLINICAL DATA:  Abdominal pain chest pain EXAM: CT ANGIOGRAPHY CHEST WITH CONTRAST TECHNIQUE: Multidetector CT imaging of the chest was performed using the standard protocol during bolus administration of intravenous contrast. Multiplanar CT image reconstructions and MIPs were obtained to evaluate the vascular anatomy. RADIATION DOSE REDUCTION: This exam was performed according to the departmental dose-optimization program which includes automated exposure control, adjustment of the mA and/or kV according to patient size and/or use of iterative reconstruction technique. CONTRAST:  75mL OMNIPAQUE  IOHEXOL  350 MG/ML SOLN COMPARISON:  Chest x-ray 10/09/2024, chest CT 06/02/2024 FINDINGS: Cardiovascular: Satisfactory opacification of the pulmonary arteries to the segmental level. No evidence of pulmonary embolism. Mild atherosclerosis. Negative for aortic aneurysm. Multi vessel coronary vascular calcification. Normal cardiac size.  No pericardial effusion Mediastinum/Nodes: Patent trachea. No suspicious thyroid mass. No suspicious lymph nodes. Esophagus within normal limits. Lungs/Pleura: No acute airspace disease. Mild emphysema. Bilateral bronchial wall thickening. Subpleural left lower lobe nodule or nodular focus of consolidation with mild surrounding ground-glass disease measuring up to 12 mm, series 5, image 115. Upper Abdomen: See separately dictated abdominopelvic CT Musculoskeletal: No acute osseous abnormality Review of the MIP images confirms the above findings. IMPRESSION: 1. Negative for acute pulmonary embolus. 2. Emphysema with bilateral bronchial wall thickening. 3. 12 mm subpleural left lower lobe nodule or nodular focus of consolidation with mild surrounding ground-glass disease. This may be infectious or inflammatory, but recommend CT chest follow-up in 3 months to ensure resolution. 4. Aortic atherosclerosis. Aortic Atherosclerosis (ICD10-I70.0) and Emphysema (ICD10-J43.9). Electronically Signed   By: Luke Bun M.D.   On: 10/09/2024 15:17   DG Chest 2 View Result Date: 10/09/2024 EXAM: 2 VIEW(S) XRAY OF THE CHEST 10/09/2024 08:39:00 AM COMPARISON: 06/02/2024 CLINICAL HISTORY: Chest pain and jaw pain. FINDINGS: LUNGS AND PLEURA: No focal pulmonary opacity. No pleural effusion. No pneumothorax. HEART AND MEDIASTINUM: No acute abnormality of the cardiac and mediastinal silhouettes. BONES AND SOFT TISSUES: Multilevel degenerative changes of thoracic spine. IMPRESSION: 1. No acute cardiopulmonary abnormality. Electronically signed by: Waddell Calk MD 10/09/2024 08:49 AM EST RP Workstation: [id number]     Procedures   CRITICAL CARE Performed by: Karla Hoover Total critical care time: 30 minutes Critical care time was exclusive of separately billable procedures and treating other patients. Critical care was necessary to treat or prevent imminent or  life-threatening deterioration. Critical care was time  spent personally by me on the following activities: development of treatment plan with patient and/or surrogate as well as nursing, discussions with consultants, evaluation of patient's response to treatment, examination of patient, obtaining history from patient or surrogate, ordering and performing treatments and interventions, ordering and review of laboratory studies, ordering and review of radiographic studies, pulse oximetry and re-evaluation of patient's condition.  Medications Ordered in the ED  doxycycline  (VIBRAMYCIN ) 100 mg in sodium chloride  0.9 % 250 mL IVPB (has no administration in time range)  cefTRIAXone  (ROCEPHIN ) 1 g in sodium chloride  0.9 % 100 mL IVPB (0 g Intravenous Stopped 10/09/24 1403)  fentaNYL  (SUBLIMAZE ) injection 50 mcg (50 mcg Intravenous Given 10/09/24 1329)  iohexol  (OMNIPAQUE ) 350 MG/ML injection 75 mL (75 mLs Intravenous Contrast Given 10/09/24 1436)  morphine  (PF) 4 MG/ML injection 4 mg (4 mg Intravenous Given 10/09/24 1510)                                    Medical Decision Making Amount and/or Complexity of Data Reviewed Labs: ordered. Radiology: ordered.  Risk Prescription drug management.    AMBRIELLA KITT is a 53 y.o. 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  According to patient and family, for the last few days patient has had some URI symptoms with some cough.  She then started having worsened pleuritic pain in her left chest that goes into her left shoulder and up into her left face and jaw.  She says it feels sharp and pleuritic just like when she had a previous blood clots.  She takes her blood thinners.  She denies any  trauma.  She does report some abdominal pain but denies significant nausea or vomiting.  Denies constipation or diarrhea.  Denies any dysuria but says that her legs feel cold which she normally gets when she has UTIs.  Family said this is unconventional but that is how the patient feels when she gets a UTI.  Otherwise she denies headache or neck pain or other complaints today.  On exam, lungs do have some coarseness.  Did not appreciate wheezing initially.  Chest is tender.  Abdomen is tender.  I did hear bowel sounds.  Legs have no significant edema and are unchanged from baseline.  They did not feel cold to the touch.  Patient has weakness on her left side that is unchanged from baseline.  She also has left facial droop that is unchanged.  EKG does not show STEMI.  Patient had some workup in triage including a chest x-ray that did not show clear pneumonia or fluid or other abnormality.  Troponin is 24 and similar to what has been in the past, will trend.  Urinalysis does show leukocytes and bacteria and given her symptoms consistent with how she has with UTI albeit atypical in symptoms, will treat with antibiotics.  She does have a leukocytosis which is new and has a slight bump in her creatinine as well.  Will get other labs to look at her abdominal pain so we will get lipase and hepatic function and will get a lactic acid.  Will see what  her COVID/flu/RSV swab shows because of the sick contacts at her facility and the coughing she has been having.  With the abdominal pain we will get a CT abdomen pelvis and with her pleuritic chest pain and shortness of breath we will get a CT PE study as she reports it feels similar to her previous thromboembolic disease.  Anticipate reassessment after workup to determine disposition.  3:29 PM Care transferred oncoming team to wait for workup to complete.  Patient became hypoxic and CT does show evidence of pneumonia.  I spoke to pharmacy who will add doxycycline   for atypical coverage along with the Rocephin  that was already given given the allergies and intolerances to patient has.  Patient awaiting results of CT on pelvis and then will need admission for UTI, pneumonia, hypoxia, and pain control.      Final diagnoses:  Atypical chest pain  Urinary tract infection without hematuria, site unspecified  Pneumonia due to infectious organism, unspecified laterality, unspecified part of lung  Abdominal pain, unspecified abdominal location  Hypoxia     Clinical Impression: 1. Atypical chest pain   2. Urinary tract infection without hematuria, site unspecified   3. Pneumonia due to infectious organism, unspecified laterality, unspecified part of lung   4. Abdominal pain, unspecified abdominal location   5. Hypoxia     Disposition: Patient awaiting results of CT on pelvis and then will need admission for further management of pneumonia, UTI, hypoxia, and pain control.  This note was prepared with assistance of Conservation officer, historic buildings. Occasional wrong-word or sound-a-like substitutions may have occurred due to the inherent limitations of voice recognition software.      Tevita Gomer, Karla PARAS, MD 10/09/24 1531  "

## 2024-10-09 NOTE — ED Provider Triage Note (Signed)
 Emergency Medicine Provider Triage Evaluation Note  Karla Hoover , a 54 y.o. female  was evaluated in triage.  Pt complains of left-sided chest pain with jaw pain.  Patient states this has been ongoing for a day.  Describes the pain as sharp, sudden, self resolves.  Has been having some mild shortness of breath with nonproductive cough.  No hemoptysis or leg swelling.  Review of Systems  Positive: Left-sided arm/chest/jaw pain, nonproductive cough Negative: Leg swelling, hemoptysis  Physical Exam  BP (!) 152/77   Pulse 77   Temp 98 F (36.7 C) (Oral)   Resp 16   Ht 5' 7.5 (1.715 m)   Wt 85.7 kg   LMP 10/20/2019   SpO2 99%   BMI 29.16 kg/m  Gen:   Awake, no distress   Resp:  Normal effort  MSK:   Moves extremities without difficulty, no focal swelling   Medical Decision Making  Medically screening exam initiated at 8:21 AM.  Appropriate orders placed.  Karla Hoover was informed that the remainder of the evaluation will be completed by another provider, this initial triage assessment does not replace that evaluation, and the importance of remaining in the ED until their evaluation is complete.  54 year old female presents emergency department intermittent sharp left-sided chest/arm/jaw pain, currently resolved.  Mild shortness of breath with nonproductive cough.  Vitals normal and stable.  EKG shows no ischemic changes.  Workup initiated.  Patient evaluated sitting up in chair, fully clothed, limited PE. Orders placed. Patient was counseled that they need to remain in the ED until the completion of their work-up including a full H&P, additional testing and results of any tests.  The patient appears stable and the remainder of the encounter may be completed by another provider.   Karla Hoover, [place] 10/09/24 9177

## 2024-10-10 DIAGNOSIS — J189 Pneumonia, unspecified organism: Secondary | ICD-10-CM | POA: Diagnosis not present

## 2024-10-10 DIAGNOSIS — E1122 Type 2 diabetes mellitus with diabetic chronic kidney disease: Secondary | ICD-10-CM

## 2024-10-10 DIAGNOSIS — I8290 Acute embolism and thrombosis of unspecified vein: Secondary | ICD-10-CM | POA: Diagnosis not present

## 2024-10-10 DIAGNOSIS — N1831 Chronic kidney disease, stage 3a: Secondary | ICD-10-CM

## 2024-10-10 DIAGNOSIS — Z794 Long term (current) use of insulin: Secondary | ICD-10-CM

## 2024-10-10 LAB — COMPREHENSIVE METABOLIC PANEL WITH GFR
ALT: 8 U/L (ref 0–44)
AST: 20 U/L (ref 15–41)
Albumin: 3.6 g/dL (ref 3.5–5.0)
Alkaline Phosphatase: 72 U/L (ref 38–126)
Anion gap: 8 (ref 5–15)
BUN: 25 mg/dL — ABNORMAL HIGH (ref 6–20)
CO2: 31 mmol/L (ref 22–32)
Calcium: 9.5 mg/dL (ref 8.9–10.3)
Chloride: 97 mmol/L — ABNORMAL LOW (ref 98–111)
Creatinine, Ser: 1.36 mg/dL — ABNORMAL HIGH (ref 0.44–1.00)
GFR, Estimated: 47 mL/min — ABNORMAL LOW
Glucose, Bld: 125 mg/dL — ABNORMAL HIGH (ref 70–99)
Potassium: 4.6 mmol/L (ref 3.5–5.1)
Sodium: 136 mmol/L (ref 135–145)
Total Bilirubin: 0.2 mg/dL (ref 0.0–1.2)
Total Protein: 6.8 g/dL (ref 6.5–8.1)

## 2024-10-10 LAB — CBC
HCT: 41.1 % (ref 36.0–46.0)
Hemoglobin: 13 g/dL (ref 12.0–15.0)
MCH: 27.8 pg (ref 26.0–34.0)
MCHC: 31.6 g/dL (ref 30.0–36.0)
MCV: 87.8 fL (ref 80.0–100.0)
Platelets: 232 K/uL (ref 150–400)
RBC: 4.68 MIL/uL (ref 3.87–5.11)
RDW: 13.2 % (ref 11.5–15.5)
WBC: 10.2 K/uL (ref 4.0–10.5)
nRBC: 0 % (ref 0.0–0.2)

## 2024-10-10 LAB — GLUCOSE, CAPILLARY
Glucose-Capillary: 189 mg/dL — ABNORMAL HIGH (ref 70–99)
Glucose-Capillary: 212 mg/dL — ABNORMAL HIGH (ref 70–99)

## 2024-10-10 LAB — CBG MONITORING, ED
Glucose-Capillary: 136 mg/dL — ABNORMAL HIGH (ref 70–99)
Glucose-Capillary: 102 mg/dL — ABNORMAL HIGH (ref 70–99)
Glucose-Capillary: 217 mg/dL — ABNORMAL HIGH (ref 70–99)

## 2024-10-10 MED ORDER — INSULIN GLARGINE-YFGN 100 UNIT/ML ~~LOC~~ SOLN
20.0000 [IU] | Freq: Every day | SUBCUTANEOUS | Status: DC
  Administered 2024-10-11 – 2024-10-13 (×3): 20 [IU] via SUBCUTANEOUS
  Filled 2024-10-10 (×4): qty 0.2

## 2024-10-10 MED ORDER — MORPHINE SULFATE (PF) 2 MG/ML IV SOLN
2.0000 mg | Freq: Once | INTRAVENOUS | Status: AC
  Administered 2024-10-10: 2 mg via INTRAVENOUS
  Filled 2024-10-10: qty 1

## 2024-10-10 NOTE — Progress Notes (Incomplete)
 " PROGRESS NOTE    Karla Hoover  FMW:[id number] DOB: 24-Sep-1971 DOA: 10/09/2024 PCP: Pcp, No    Brief Narrative:    Patient is a 54 year old female with PMHx significant for CVA with LUE hemiparesis, HFpEF, COPD, chronic hypoxic respiratory failure on 2 L PRN, PE/DVT on Eliquis , HTN, HLD, CKD stage IIIa, T2DM, hypothyroidism, anxiety, GERD, QTc prolongation who presented to the ED on 10/10/2024 complaining of several days of left-sided chest pain.  Reports intermittent left-sided chest pain, pleuritic in nature, inhibiting deep breaths, radiating to the left shoulder and left jaw.  She reports symptoms are similar to when she was diagnosed with PE in the first time.  Also reporting URI symptoms as well as a sensation of cold feet and legs which she reports usually correlates with her having a UTI, though she denies dysuria but reports foul smelling urine.   In the ED, temp was 98.5 F, RR 22, HR 83, BP 115/103.  She was initially on RA, but desatted to the 80s requiring placement of 2 L [place] (which is her home baseline).  CBC showed mild leukocytosis to 12.2.  BMP showed K5.1, creatinine 1.31.  High-sensitivity troponins 24 > 25.  UA showed cloudy urine with large leukocytes, >50 WBCs, and few bacteria.  4 Plex RVP was negative.  Lipase is 36.  Initial lactate 2.1 improved 12.3.  CXR showed no acute cardiopulmonary abnormality.    CTA chest was negative for PE but showed emphysema with bilateral bronchial wall thickening, 12 mm subpleural LLL nodule or nodular consolidation with surrounding ground glass disease possibly infectious versus inflammatory.  Recommended repeat CT in 3 months to ensure resolution.    CT abdomen pelvis with contrast showed no acute intra-abdominal or pelvic abnormality, but noted small focus of gas in the urinary bladder.  Patient was given fentanyl , morphine , Rocephin , and doxycycline  in the ED.  The patient was admitted for pneumonia and UTI.  Assessment and  Plan:   #Acute on chronic hypoxic respiratory failure #Community-acquired pneumonia - Patient presented with 3 days of pleuritic left-sided chest pain with radiation to her left shoulder and jaw - High-sensitivity troponins flat 24 > 25 - CTA negative for PE, showed subpleural LLL nodule with ground-glass disease -     Scheduled Meds:  apixaban   5 mg Oral BID   aspirin   81 mg Oral q AM   atenolol   50 mg Oral BID   atorvastatin   80 mg Oral QHS   ezetimibe   10 mg Oral QHS   FLUoxetine   20 mg Oral Daily   fluticasone  furoate-vilanterol  1 puff Inhalation Daily   gabapentin   400 mg Oral TID   insulin  aspart  0-15 Units Subcutaneous TID WC   insulin  aspart  0-5 Units Subcutaneous QHS   [START ON 10/11/2024] insulin  glargine-yfgn  20 Units Subcutaneous QHS   levothyroxine   88 mcg Oral Q0600   losartan   37.5 mg Oral Daily   melatonin  5 mg Oral QHS   pantoprazole   40 mg Oral Daily   QUEtiapine   12.5 mg Oral QHS   rOPINIRole   0.25 mg Oral QHS   sodium chloride  flush  3 mL Intravenous Q12H   umeclidinium bromide   1 puff Inhalation Daily   Continuous Infusions:  cefTRIAXone  (ROCEPHIN )  IV 2 g (10/10/24 2232)   doxycycline  (VIBRAMYCIN ) IV 125 mL/hr at 10/10/24 1830   PRN Meds:.acetaminophen  **OR** acetaminophen , albuterol , LORazepam , polyethylene glycol  Current Outpatient Medications  Medication Instructions   acetaminophen  (TYLENOL ) 1,000 mg,  3 times daily   albuterol  (VENTOLIN  HFA) 108 (90 Base) MCG/ACT inhaler 2 puffs, Inhalation, Every 6 hours PRN   apixaban  (ELIQUIS ) 5 MG TABS tablet Take 1 tablet (5mg ) twice daily   aspirin  81 mg, Every morning   atenolol  (TENORMIN ) 50 mg, Oral, 2 times daily   atorvastatin  (LIPITOR ) 80 mg, Oral, Daily   barrier cream (NON-SPECIFIED) CREA 1 Application, 2 times daily   chlorhexidine  (PERIDEX ) 0.12 % solution 15 mLs, Mouth/Throat, Daily   cyclobenzaprine  (FLEXERIL ) 10 mg, Oral, 3 times daily PRN   diclofenac   Sodium (VOLTAREN ) 2 g, Topical, 2 times daily   Emollient (EUCERIN) lotion 1 Application, As needed   EPINEPHrine (EPIPEN 2-PAK) 0.3 mg, Daily PRN   ezetimibe  (ZETIA ) 10 mg, Daily at bedtime   famotidine  (PEPCID ) 20 mg, Oral, 2 times daily   Fish Oil 1,000 mg, Daily at bedtime   FLUoxetine  (PROZAC ) 20 mg, Daily   fluticasone -salmeterol (ADVAIR) 100-50 MCG/ACT AEPB 1 puff, 2 times daily   gabapentin  (NEURONTIN ) 400 mg, Oral, 3 times daily   hydrALAZINE  (APRESOLINE ) 10 mg, Every 12 hours PRN   INCRUSE ELLIPTA  62.5 MCG/ACT AEPB 1 puff, Daily   insulin  lispro (HUMALOG ) 0-14 Units, See admin instructions   LACTOBACILLUS PO 1 capsule, Daily   Lantus  SoloStar 38 Units, Subcutaneous, Daily at bedtime   levothyroxine  (SYNTHROID ) 88 mcg, Every morning   LORazepam  (ATIVAN ) 0.5 MG tablet Take 0.5 mg by mouth 2 (two) times daily.   losartan  (COZAAR ) 37.5 mg, Daily   Macrobid  100 mg, Every morning   Melatonin 7.5 mg, Daily at bedtime   Menthol , Topical Analgesic, (BIOFREEZE COOL THE PAIN) 4 % GEL 1 Application, Topical, 2 times daily   Nutritional Supplements (BOOST GLUCOSE CONTROL PO) 240 mLs, Oral, 2 times daily   ondansetron  (ZOFRAN ) 4 mg, Every 8 hours PRN   OXYGEN  2 L/min, As needed   polyethylene glycol powder (GLYCOLAX /MIRALAX ) 17 g, Daily   QUEtiapine  (SEROQUEL ) 25 mg, Oral, Daily at bedtime   rOPINIRole  (REQUIP ) 0.25 mg, Oral, Daily at bedtime   senna-docusate (SENOKOT-S) 8.6-50 MG tablet 1 tablet, Oral, 2 times daily   Trulicity  3 mg, Subcutaneous, Every 7 days   Vitamin D3 50,000 Units, Every Thu   witch hazel-glycerin (TUCKS) pad 1 Application, Every 4 hours PRN    DVT prophylaxis:  apixaban  (ELIQUIS ) tablet 5 mg   Code Status:   Code Status: Full Code  Family Communication: ***  Disposition Plan: *** PT -   -   OT -   -    Level of care: Telemetry  Consultants:  ***  Procedures:  ***  Antimicrobials: ***    Subjective: ***  Objective: Vitals:   10/10/24 1030 10/10/24 1353 10/10/24 1653 10/10/24 2003  BP: (!) 120/59 136/87 (!) 142/93 (!) 145/79  Pulse: 79 84 87 80  Resp: 14 19 18 18   Temp:  98.5 F (36.9 C) 98.9 F (37.2 C) 98.8 F (37.1 C)  TempSrc:  Oral    SpO2: 98% 95% 92% 90%  Weight:      Height:        Intake/Output Summary (Last 24 hours) at 10/10/2024 2320 Last data filed at 10/10/2024 1830 Gross per 24 hour  Intake 450.23 ml  Output 0 ml  Net 450.23 ml   Filed Weights   10/09/24 0754  Weight: 85.7 kg    Examination:  Gen: NAD, A&Ox3 HEENT: NCAT, EOMI Neck: Supple, no JVD, no LAD CV: RRR, no murmurs Resp: normal WOB, CTAB, no  w/r/r Abd: Soft, NTND, no guarding, BS normoactive Ext: No LE edema, pulses 2+ b/l Skin: Warm, dry, no rashes/lesions Neuro: CN II-XII grossly intact, strength 5/5 b/l, sensation intact Psych: Calm, cooperative, appropriate affect    Data Reviewed: I have personally reviewed following labs and imaging studies  CBC: Recent Labs  Lab 10/09/24 0800 10/10/24 0245  WBC 12.2* 10.2  HGB 14.0 13.0  HCT 44.5 41.1  MCV 86.6 87.8  PLT 250 232   Basic Metabolic Panel: Recent Labs  Lab 10/09/24 0800 10/10/24 0245  NA 134* 136  K 5.1 4.6  CL 95* 97*  CO2 26 31  GLUCOSE 224* 125*  BUN 29* 25*  CREATININE 1.31* 1.36*  CALCIUM  10.2 9.5   GFR: Estimated Creatinine Clearance: 55 mL/min (A) (by C-G formula based on SCr of 1.36 mg/dL (H)). Liver Function Tests: Recent Labs  Lab 10/09/24 1256 10/10/24 0245  AST 25 20  ALT 8 8  ALKPHOS 79 72  BILITOT 0.3 0.2  PROT 7.6 6.8  ALBUMIN 4.0 3.6   Recent Labs  Lab 10/09/24 1256  LIPASE 36   No results for input(s): AMMONIA in the last 168 hours. Coagulation Profile: No results for input(s): INR, PROTIME in the last 168 hours. Cardiac Enzymes: No results for input(s): CKTOTAL, CKMB, CKMBINDEX, TROPONINI in the last 168 hours. BNP (last 3 results) No results  for input(s): PROBNP in the last 8760 hours. HbA1C: No results for input(s): HGBA1C in the last 72 hours. CBG: Recent Labs  Lab 10/10/24 0807 10/10/24 1150 10/10/24 1624 10/10/24 1652 10/10/24 2004  GLUCAP 136* 102* 217* 189* 212*   Lipid Profile: No results for input(s): CHOL, HDL, LDLCALC, TRIG, CHOLHDL, LDLDIRECT in the last 72 hours. Thyroid Function Tests: No results for input(s): TSH, T4TOTAL, FREET4, T3FREE, THYROIDAB in the last 72 hours. Anemia Panel: No results for input(s): VITAMINB12, FOLATE, FERRITIN, TIBC, IRON, RETICCTPCT in the last 72 hours. Sepsis Labs: Recent Labs  Lab 10/09/24 1407 10/09/24 1503  LATICACIDVEN 2.1* 1.3    Recent Results (from the past 240 hours)  Resp panel by RT-PCR (RSV, Flu A&B, Covid) Anterior Nasal Swab     Status: None   Collection Time: 10/09/24 12:37 PM   Specimen: Anterior Nasal Swab  Result Value Ref Range Status   SARS Coronavirus 2 by RT PCR NEGATIVE NEGATIVE Final   Influenza A by PCR NEGATIVE NEGATIVE Final   Influenza B by PCR NEGATIVE NEGATIVE Final    Comment: (NOTE) The Xpert Xpress SARS-CoV-2/FLU/RSV plus assay is intended as an aid in the diagnosis of influenza from Nasopharyngeal swab specimens and should not be used as a sole basis for treatment. Nasal washings and aspirates are unacceptable for Xpert Xpress SARS-CoV-2/FLU/RSV testing.  Fact Sheet for Patients: bloggercourse.com  Fact Sheet for Healthcare Providers: seriousbroker.it  This test is not yet approved or cleared by the [place]  FDA and has been authorized for detection and/or diagnosis of SARS-CoV-2 by FDA under an Emergency Use Authorization (EUA). This EUA will remain in effect (meaning this test can be used) for the duration of the COVID-19 declaration under Section 564(b)(1) of the Act, 21 U.S.C. section 360bbb-3(b)(1), unless the authorization is  terminated or revoked.     Resp Syncytial Virus by PCR NEGATIVE NEGATIVE Final    Comment: (NOTE) Fact Sheet for Patients: bloggercourse.com  Fact Sheet for Healthcare Providers: seriousbroker.it  This test is not yet approved or cleared by the [place]  FDA and has been authorized for detection and/or diagnosis  of SARS-CoV-2 by FDA under an Emergency Use Authorization (EUA). This EUA will remain in effect (meaning this test can be used) for the duration of the COVID-19 declaration under Section 564(b)(1) of the Act, 21 U.S.C. section 360bbb-3(b)(1), unless the authorization is terminated or revoked.  Performed at [facility], [address], [place], [place] [address]      Radiology Studies: CT ABDOMEN PELVIS W CONTRAST Result Date: 10/09/2024 CLINICAL DATA:  Abdominal pain EXAM: CT ABDOMEN AND PELVIS WITH CONTRAST TECHNIQUE: Multidetector CT imaging of the abdomen and pelvis was performed using the standard protocol following bolus administration of intravenous contrast. RADIATION DOSE REDUCTION: This exam was performed according to the departmental dose-optimization program which includes automated exposure control, adjustment of the mA and/or kV according to patient size and/or use of iterative reconstruction technique. CONTRAST:  75mL OMNIPAQUE  IOHEXOL  350 MG/ML SOLN COMPARISON:  MRI 02/17/2024, CT 10/21/2023 FINDINGS: Lower chest: Lung bases demonstrate focal nodular density in the subpleural left lower lobe. See separately dictated chest CT Hepatobiliary: No focal liver abnormality is seen. No gallstones, gallbladder wall thickening, or biliary dilatation. Pancreas: Unremarkable. No pancreatic ductal dilatation or surrounding inflammatory changes. Spleen: Normal in size without focal abnormality. Adrenals/Urinary Tract: Adrenal glands are normal. No hydronephrosis. Cortical scarring and atrophy of the right kidney. Cortical  scarring of the upper pole left kidney. Focal hypodensity right kidney best seen on coronal series 7, image 37 measuring 10 mm, appears more low-density today, on the prior exam measured 16 mm. Bladder is unremarkable except for small focus of gas Stomach/Bowel: Stomach within normal limits. No dilated small bowel. No acute bowel wall thickening. Negative appendix Vascular/Lymphatic: Aortic atherosclerosis. No enlarged abdominal or pelvic lymph nodes. Reproductive: Uterus and bilateral adnexa are unremarkable. Other: No ascites or free air Musculoskeletal: No acute or suspicious osseous abnormality IMPRESSION: 1. No CT evidence for acute intra-abdominal or pelvic abnormality. 2. Cortical scarring and atrophy of the right kidney. Cortical scarring of the upper pole left kidney. Focal hypodensity in the right kidney appears more low-density today and less conspicuous than on the prior exam, reference MRI 02/17/2024 for additional follow-up recommendations 3. Small focus of gas in the urinary bladder, correlate for recent instrumentation. 4. Aortic atherosclerosis. Aortic Atherosclerosis (ICD10-I70.0). Electronically Signed   By: Luke Bun M.D.   On: 10/09/2024 15:39   CT Angio Chest PE W and/or Wo Contrast Result Date: 10/09/2024 CLINICAL DATA:  Abdominal pain chest pain EXAM: CT ANGIOGRAPHY CHEST WITH CONTRAST TECHNIQUE: Multidetector CT imaging of the chest was performed using the standard protocol during bolus administration of intravenous contrast. Multiplanar CT image reconstructions and MIPs were obtained to evaluate the vascular anatomy. RADIATION DOSE REDUCTION: This exam was performed according to the departmental dose-optimization program which includes automated exposure control, adjustment of the mA and/or kV according to patient size and/or use of iterative reconstruction technique. CONTRAST:  75mL OMNIPAQUE  IOHEXOL  350 MG/ML SOLN COMPARISON:  Chest x-ray 10/09/2024, chest CT 06/02/2024 FINDINGS:  Cardiovascular: Satisfactory opacification of the pulmonary arteries to the segmental level. No evidence of pulmonary embolism. Mild atherosclerosis. Negative for aortic aneurysm. Multi vessel coronary vascular calcification. Normal cardiac size. No pericardial effusion Mediastinum/Nodes: Patent trachea. No suspicious thyroid mass. No suspicious lymph nodes. Esophagus within normal limits. Lungs/Pleura: No acute airspace disease. Mild emphysema. Bilateral bronchial wall thickening. Subpleural left lower lobe nodule or nodular focus of consolidation with mild surrounding ground-glass disease measuring up to 12 mm, series 5, image 115. Upper Abdomen: See separately dictated abdominopelvic CT  Musculoskeletal: No acute osseous abnormality Review of the MIP images confirms the above findings. IMPRESSION: 1. Negative for acute pulmonary embolus. 2. Emphysema with bilateral bronchial wall thickening. 3. 12 mm subpleural left lower lobe nodule or nodular focus of consolidation with mild surrounding ground-glass disease. This may be infectious or inflammatory, but recommend CT chest follow-up in 3 months to ensure resolution. 4. Aortic atherosclerosis. Aortic Atherosclerosis (ICD10-I70.0) and Emphysema (ICD10-J43.9). Electronically Signed   By: Luke Bun M.D.   On: 10/09/2024 15:17   DG Chest 2 View Result Date: 10/09/2024 EXAM: 2 VIEW(S) XRAY OF THE CHEST 10/09/2024 08:39:00 AM COMPARISON: 06/02/2024 CLINICAL HISTORY: Chest pain and jaw pain. FINDINGS: LUNGS AND PLEURA: No focal pulmonary opacity. No pleural effusion. No pneumothorax. HEART AND MEDIASTINUM: No acute abnormality of the cardiac and mediastinal silhouettes. BONES AND SOFT TISSUES: Multilevel degenerative changes of thoracic spine. IMPRESSION: 1. No acute cardiopulmonary abnormality. Electronically signed by: Waddell Calk MD 10/09/2024 08:49 AM EST RP Workstation: [id number]    Scheduled Meds:  apixaban   5 mg Oral BID   aspirin   81 mg Oral q AM    atenolol   50 mg Oral BID   atorvastatin   80 mg Oral QHS   ezetimibe   10 mg Oral QHS   FLUoxetine   20 mg Oral Daily   fluticasone  furoate-vilanterol  1 puff Inhalation Daily   gabapentin   400 mg Oral TID   insulin  aspart  0-15 Units Subcutaneous TID WC   insulin  aspart  0-5 Units Subcutaneous QHS   [START ON 10/11/2024] insulin  glargine-yfgn  20 Units Subcutaneous QHS   levothyroxine   88 mcg Oral Q0600   losartan   37.5 mg Oral Daily   melatonin  5 mg Oral QHS   pantoprazole   40 mg Oral Daily   QUEtiapine   12.5 mg Oral QHS   rOPINIRole   0.25 mg Oral QHS   sodium chloride  flush  3 mL Intravenous Q12H   umeclidinium bromide   1 puff Inhalation Daily   Continuous Infusions:  cefTRIAXone  (ROCEPHIN )  IV 2 g (10/10/24 2232)   doxycycline  (VIBRAMYCIN ) IV 125 mL/hr at 10/10/24 1830     Unresulted Labs (From admission, onward)     Start     Ordered   10/10/24 2305  MRSA Next Gen by PCR, Nasal  (MRSA Screening)  Once,   R       Question Answer Comment  Patient immune status Normal   Release to patient Immediate      10/10/24 2304   10/09/24 1706  HIV Antibody (routine testing w rflx)  (HIV Antibody (Routine testing w reflex) panel)  Once,   R        10/09/24 1723   10/09/24 1257  Urine Culture  Add-on,   AD       Question:  Indication  Answer:  Flank Pain   10/09/24 1256             LOS:  LOS: 0 days   Time Spent: *** minutes  Dartanion Teo Al-Sultani, MD [facility]  If 7PM-7AM, please contact night-coverage  10/10/2024, 11:21 PM      "

## 2024-10-10 NOTE — ED Notes (Signed)
 Pt breakfast tray at bedside. Pt declined at this time. Pt states she isn't a breakfast person. Pt will inform RN if she wants the tray reheated.

## 2024-10-10 NOTE — ED Notes (Signed)
 RN assisted pt on [facility]

## 2024-10-10 NOTE — Progress Notes (Signed)
 Pt c/o pain all over at 9/10 and is asking for something stronger than Tylenol . RN messaged MD on call to see if something else can be given.   Bari HERO Jorgina Binning

## 2024-10-10 NOTE — ED Notes (Signed)
 Pt transferred herself back to bed. Pt provided warm blankets and beverage

## 2024-10-10 NOTE — Progress Notes (Signed)
 " PROGRESS NOTE    Karla Hoover  FMW:[id number] DOB: 1971/05/22 DOA: 10/09/2024 PCP: Pcp, No    Brief Narrative:    Patient is a 54 year old female with PMHx significant for CVA with LUE hemiparesis, HFpEF, COPD, chronic hypoxic respiratory failure on 2 L PRN, PE/DVT on Eliquis , HTN, HLD, CKD stage IIIa, T2DM, hypothyroidism, anxiety, GERD, QTc prolongation who presented to the ED on 10/10/2024 complaining of several days of left-sided chest pain.  Reports intermittent left-sided chest pain, pleuritic in nature, inhibiting deep breaths, radiating to the left shoulder and left jaw.  She reports symptoms are similar to when she was diagnosed with PE in the first time.  Also reporting URI symptoms as well as a sensation of cold feet and legs which she reports usually correlates with her having a UTI, though she denies dysuria but reports foul smelling urine.   In the ED, temp was 98.5 F, RR 22, HR 83, BP 115/103.  She was initially on RA, but desatted to the 80s requiring placement of 2 L [place] (which is her home baseline).  CBC showed mild leukocytosis to 12.2.  BMP showed K5.1, creatinine 1.31.  High-sensitivity troponins 24 > 25.  UA showed cloudy urine with large leukocytes, >50 WBCs, and few bacteria.  4 Plex RVP was negative.  Lipase is 36.  Initial lactate 2.1 improved 12.3.  CXR showed no acute cardiopulmonary abnormality.    CTA chest was negative for PE but showed emphysema with bilateral bronchial wall thickening, 12 mm subpleural LLL nodule or nodular consolidation with surrounding ground glass disease possibly infectious versus inflammatory.  Recommended repeat CT in 3 months to ensure resolution.    CT abdomen pelvis with contrast showed no acute intra-abdominal or pelvic abnormality, but noted small focus of gas in the urinary bladder.  Patient was given fentanyl , morphine , Rocephin , and doxycycline  in the ED.  The patient was admitted for pneumonia and UTI.  Assessment and  Plan:   #Acute on chronic hypoxic respiratory failure #Community-acquired pneumonia - Patient presented with 3 days of pleuritic left-sided chest pain with radiation to her left shoulder and jaw - At home, only uses O2 on exertion and for sleep. While in the ED, desatted to 80s at rest and was placed on 2L [place]  - High-sensitivity troponins flat 24 > 25 - CTA negative for PE, showed subpleural LLL nodule with ground-glass disease - Afebrile, leukocytosis resolved - Continue Rocephin  and doxycycline  - Wean O2 as tolerated, goal SpO2 88-92%  # Possible UTI - History of prior ESBL E. Coli UTIs  - On chronic suppression therapy with macrobid  - Leukocytosis resolved despite being on Rocephin /Doxy - Urine culture pending - Will continue for now given clinical improvement pending final cultures  # Hypertension - Continue home atenolol , Losartan    # Hyperlipidemia - Continue home atorvastatin , Zetia    # T2DM - Home regimen includes 38U long-acting insulin  nightly - 20 units long-acting insulin  nightly - SSI   # CKD 3A - Creatinine 1.36  (baseline 0.9-1.1) - Did not improve with fluids - Will monitor for now    # Hypothyroidism - Continue home Synthroid    # History of CVA with residual LUE hemiparesis - Continue home aspirin , atorvastatin , Zetia  - Continue Eliquis    # History of DVT and PE - Continue Eliquis    # Chronic HFpEF -  TTE 10/2023 and showed EF 55-60%, G1 DD, normal RV function. - Not currently on diuretic - Euvolemic on exam   # Depression #  Anxiety - Continue home fluoxetine  and Seroquel    # COPD - Continue home Incruse - Replace home Advair with formulary Breo - Continue as needed albuterol       Scheduled Meds:  apixaban   5 mg Oral BID   aspirin   81 mg Oral q AM   atenolol   50 mg Oral BID   atorvastatin   80 mg Oral QHS   ezetimibe   10 mg Oral QHS   FLUoxetine   20 mg Oral Daily   fluticasone  furoate-vilanterol  1 puff Inhalation Daily    gabapentin   400 mg Oral TID   insulin  aspart  0-15 Units Subcutaneous TID WC   insulin  aspart  0-5 Units Subcutaneous QHS   [START ON 10/11/2024] insulin  glargine-yfgn  20 Units Subcutaneous QHS   levothyroxine   88 mcg Oral Q0600   losartan   37.5 mg Oral Daily   melatonin  5 mg Oral QHS   pantoprazole   40 mg Oral Daily   QUEtiapine   12.5 mg Oral QHS   rOPINIRole   0.25 mg Oral QHS   sodium chloride  flush  3 mL Intravenous Q12H   umeclidinium bromide   1 puff Inhalation Daily   Continuous Infusions:  cefTRIAXone  (ROCEPHIN )  IV 2 g (10/10/24 2232)   doxycycline  (VIBRAMYCIN ) IV 125 mL/hr at 10/10/24 1830   PRN Meds:.acetaminophen  **OR** acetaminophen , albuterol , LORazepam , polyethylene glycol  Current Outpatient Medications  Medication Instructions   acetaminophen  (TYLENOL ) 1,000 mg, 3 times daily   albuterol  (VENTOLIN  HFA) 108 (90 Base) MCG/ACT inhaler 2 puffs, Inhalation, Every 6 hours PRN   apixaban  (ELIQUIS ) 5 MG TABS tablet Take 1 tablet (5mg ) twice daily   aspirin  81 mg, Every morning   atenolol  (TENORMIN ) 50 mg, Oral, 2 times daily   atorvastatin  (LIPITOR ) 80 mg, Oral, Daily   barrier cream (NON-SPECIFIED) CREA 1 Application, 2 times daily   chlorhexidine  (PERIDEX ) 0.12 % solution 15 mLs, Mouth/Throat, Daily   cyclobenzaprine  (FLEXERIL ) 10 mg, Oral, 3 times daily PRN   diclofenac  Sodium (VOLTAREN ) 2 g, Topical, 2 times daily   Emollient (EUCERIN) lotion 1 Application, As needed   EPINEPHrine (EPIPEN 2-PAK) 0.3 mg, Daily PRN   ezetimibe  (ZETIA ) 10 mg, Daily at bedtime   famotidine  (PEPCID ) 20 mg, Oral, 2 times daily   Fish Oil 1,000 mg, Daily at bedtime   FLUoxetine  (PROZAC ) 20 mg, Daily   fluticasone -salmeterol (ADVAIR) 100-50 MCG/ACT AEPB 1 puff, 2 times daily   gabapentin  (NEURONTIN ) 400 mg, Oral, 3 times daily   hydrALAZINE  (APRESOLINE ) 10 mg, Every 12 hours PRN   INCRUSE ELLIPTA  62.5 MCG/ACT AEPB 1 puff, Daily   insulin  lispro (HUMALOG ) 0-14 Units, See admin instructions    LACTOBACILLUS PO 1 capsule, Daily   Lantus  SoloStar 38 Units, Subcutaneous, Daily at bedtime   levothyroxine  (SYNTHROID ) 88 mcg, Every morning   LORazepam  (ATIVAN ) 0.5 MG tablet Take 0.5 mg by mouth 2 (two) times daily.   losartan  (COZAAR ) 37.5 mg, Daily   Macrobid  100 mg, Every morning   Melatonin 7.5 mg, Daily at bedtime   Menthol , Topical Analgesic, (BIOFREEZE COOL THE PAIN) 4 % GEL 1 Application, Topical, 2 times daily   Nutritional Supplements (BOOST GLUCOSE CONTROL PO) 240 mLs, Oral, 2 times daily   ondansetron  (ZOFRAN ) 4 mg, Every 8 hours PRN   OXYGEN  2 L/min, As needed   polyethylene glycol powder (GLYCOLAX /MIRALAX ) 17 g, Daily   QUEtiapine  (SEROQUEL ) 25 mg, Oral, Daily at bedtime   rOPINIRole  (REQUIP ) 0.25 mg, Oral, Daily at bedtime   senna-docusate (SENOKOT-S) 8.6-50 MG  tablet 1 tablet, Oral, 2 times daily   Trulicity  3 mg, Subcutaneous, Every 7 days   Vitamin D3 50,000 Units, Every Thu   witch hazel-glycerin (TUCKS) pad 1 Application, Every 4 hours PRN    DVT prophylaxis:  apixaban  (ELIQUIS ) tablet 5 mg   Code Status:   Code Status: Full Code  Family Communication: None  Disposition Plan: Back to her facility pending clinical improvement PT -   -   OT -   -    Level of care: Telemetry  Consultants:  None  Procedures:  None  Antimicrobials: None   Subjective: NAEO. Feels tired and sick. Hurts to take deep breaths, some abdominal pain. No n/v. Subjective fevers and hills. Non productive cough  Objective: Vitals:   10/10/24 1030 10/10/24 1353 10/10/24 1653 10/10/24 2003  BP: (!) 120/59 136/87 (!) 142/93 (!) 145/79  Pulse: 79 84 87 80  Resp: 14 19 18 18   Temp:  98.5 F (36.9 C) 98.9 F (37.2 C) 98.8 F (37.1 C)  TempSrc:  Oral    SpO2: 98% 95% 92% 90%  Weight:      Height:        Intake/Output Summary (Last 24 hours) at 10/10/2024 2320 Last data filed at 10/10/2024 1830 Gross per 24 hour  Intake 450.23 ml  Output 0 ml  Net 450.23 ml   Filed  Weights   10/09/24 0754  Weight: 85.7 kg    Examination:  Gen: NAD, A&Ox3 HEENT: NCAT, EOMI Neck: Supple, no JVD, no LAD CV: RRR, no murmurs Resp: normal WOB, CTAB, no w/r/r Abd: Soft, NTND, no guarding, BS normoactive Ext: No LE edema, pulses 2+ b/l Skin: Warm, dry, no rashes/lesions Neuro: CN II-XII grossly intact, strength 5/5 b/l, sensation intact Psych: Calm, cooperative, appropriate affect    Data Reviewed: I have personally reviewed following labs and imaging studies  CBC: Recent Labs  Lab 10/09/24 0800 10/10/24 0245  WBC 12.2* 10.2  HGB 14.0 13.0  HCT 44.5 41.1  MCV 86.6 87.8  PLT 250 232   Basic Metabolic Panel: Recent Labs  Lab 10/09/24 0800 10/10/24 0245  NA 134* 136  K 5.1 4.6  CL 95* 97*  CO2 26 31  GLUCOSE 224* 125*  BUN 29* 25*  CREATININE 1.31* 1.36*  CALCIUM  10.2 9.5   GFR: Estimated Creatinine Clearance: 55 mL/min (A) (by C-G formula based on SCr of 1.36 mg/dL (H)). Liver Function Tests: Recent Labs  Lab 10/09/24 1256 10/10/24 0245  AST 25 20  ALT 8 8  ALKPHOS 79 72  BILITOT 0.3 0.2  PROT 7.6 6.8  ALBUMIN 4.0 3.6   Recent Labs  Lab 10/09/24 1256  LIPASE 36   No results for input(s): AMMONIA in the last 168 hours. Coagulation Profile: No results for input(s): INR, PROTIME in the last 168 hours. Cardiac Enzymes: No results for input(s): CKTOTAL, CKMB, CKMBINDEX, TROPONINI in the last 168 hours. BNP (last 3 results) No results for input(s): PROBNP in the last 8760 hours. HbA1C: No results for input(s): HGBA1C in the last 72 hours. CBG: Recent Labs  Lab 10/10/24 0807 10/10/24 1150 10/10/24 1624 10/10/24 1652 10/10/24 2004  GLUCAP 136* 102* 217* 189* 212*   Lipid Profile: No results for input(s): CHOL, HDL, LDLCALC, TRIG, CHOLHDL, LDLDIRECT in the last 72 hours. Thyroid Function Tests: No results for input(s): TSH, T4TOTAL, FREET4, T3FREE, THYROIDAB in the last 72  hours. Anemia Panel: No results for input(s): VITAMINB12, FOLATE, FERRITIN, TIBC, IRON, RETICCTPCT in the last 72  hours. Sepsis Labs: Recent Labs  Lab 10/09/24 1407 10/09/24 1503  LATICACIDVEN 2.1* 1.3    Recent Results (from the past 240 hours)  Resp panel by RT-PCR (RSV, Flu A&B, Covid) Anterior Nasal Swab     Status: None   Collection Time: 10/09/24 12:37 PM   Specimen: Anterior Nasal Swab  Result Value Ref Range Status   SARS Coronavirus 2 by RT PCR NEGATIVE NEGATIVE Final   Influenza A by PCR NEGATIVE NEGATIVE Final   Influenza B by PCR NEGATIVE NEGATIVE Final    Comment: (NOTE) The Xpert Xpress SARS-CoV-2/FLU/RSV plus assay is intended as an aid in the diagnosis of influenza from Nasopharyngeal swab specimens and should not be used as a sole basis for treatment. Nasal washings and aspirates are unacceptable for Xpert Xpress SARS-CoV-2/FLU/RSV testing.  Fact Sheet for Patients: bloggercourse.com  Fact Sheet for Healthcare Providers: seriousbroker.it  This test is not yet approved or cleared by the [place]  FDA and has been authorized for detection and/or diagnosis of SARS-CoV-2 by FDA under an Emergency Use Authorization (EUA). This EUA will remain in effect (meaning this test can be used) for the duration of the COVID-19 declaration under Section 564(b)(1) of the Act, 21 U.S.C. section 360bbb-3(b)(1), unless the authorization is terminated or revoked.     Resp Syncytial Virus by PCR NEGATIVE NEGATIVE Final    Comment: (NOTE) Fact Sheet for Patients: bloggercourse.com  Fact Sheet for Healthcare Providers: seriousbroker.it  This test is not yet approved or cleared by the [place]  FDA and has been authorized for detection and/or diagnosis of SARS-CoV-2 by FDA under an Emergency Use Authorization (EUA). This EUA will remain in effect (meaning  this test can be used) for the duration of the COVID-19 declaration under Section 564(b)(1) of the Act, 21 U.S.C. section 360bbb-3(b)(1), unless the authorization is terminated or revoked.  Performed at [facility], [address] [place], [place] [address]      Radiology Studies: CT ABDOMEN PELVIS W CONTRAST Result Date: 10/09/2024 CLINICAL DATA:  Abdominal pain EXAM: CT ABDOMEN AND PELVIS WITH CONTRAST TECHNIQUE: Multidetector CT imaging of the abdomen and pelvis was performed using the standard protocol following bolus administration of intravenous contrast. RADIATION DOSE REDUCTION: This exam was performed according to the departmental dose-optimization program which includes automated exposure control, adjustment of the mA and/or kV according to patient size and/or use of iterative reconstruction technique. CONTRAST:  75mL OMNIPAQUE  IOHEXOL  350 MG/ML SOLN COMPARISON:  MRI 02/17/2024, CT 10/21/2023 FINDINGS: Lower chest: Lung bases demonstrate focal nodular density in the subpleural left lower lobe. See separately dictated chest CT Hepatobiliary: No focal liver abnormality is seen. No gallstones, gallbladder wall thickening, or biliary dilatation. Pancreas: Unremarkable. No pancreatic ductal dilatation or surrounding inflammatory changes. Spleen: Normal in size without focal abnormality. Adrenals/Urinary Tract: Adrenal glands are normal. No hydronephrosis. Cortical scarring and atrophy of the right kidney. Cortical scarring of the upper pole left kidney. Focal hypodensity right kidney best seen on coronal series 7, image 37 measuring 10 mm, appears more low-density today, on the prior exam measured 16 mm. Bladder is unremarkable except for small focus of gas Stomach/Bowel: Stomach within normal limits. No dilated small bowel. No acute bowel wall thickening. Negative appendix Vascular/Lymphatic: Aortic atherosclerosis. No enlarged abdominal or pelvic lymph nodes. Reproductive: Uterus and  bilateral adnexa are unremarkable. Other: No ascites or free air Musculoskeletal: No acute or suspicious osseous abnormality IMPRESSION: 1. No CT evidence for acute intra-abdominal or pelvic abnormality. 2. Cortical scarring and  atrophy of the right kidney. Cortical scarring of the upper pole left kidney. Focal hypodensity in the right kidney appears more low-density today and less conspicuous than on the prior exam, reference MRI 02/17/2024 for additional follow-up recommendations 3. Small focus of gas in the urinary bladder, correlate for recent instrumentation. 4. Aortic atherosclerosis. Aortic Atherosclerosis (ICD10-I70.0). Electronically Signed   By: Luke Bun M.D.   On: 10/09/2024 15:39   CT Angio Chest PE W and/or Wo Contrast Result Date: 10/09/2024 CLINICAL DATA:  Abdominal pain chest pain EXAM: CT ANGIOGRAPHY CHEST WITH CONTRAST TECHNIQUE: Multidetector CT imaging of the chest was performed using the standard protocol during bolus administration of intravenous contrast. Multiplanar CT image reconstructions and MIPs were obtained to evaluate the vascular anatomy. RADIATION DOSE REDUCTION: This exam was performed according to the departmental dose-optimization program which includes automated exposure control, adjustment of the mA and/or kV according to patient size and/or use of iterative reconstruction technique. CONTRAST:  75mL OMNIPAQUE  IOHEXOL  350 MG/ML SOLN COMPARISON:  Chest x-ray 10/09/2024, chest CT 06/02/2024 FINDINGS: Cardiovascular: Satisfactory opacification of the pulmonary arteries to the segmental level. No evidence of pulmonary embolism. Mild atherosclerosis. Negative for aortic aneurysm. Multi vessel coronary vascular calcification. Normal cardiac size. No pericardial effusion Mediastinum/Nodes: Patent trachea. No suspicious thyroid mass. No suspicious lymph nodes. Esophagus within normal limits. Lungs/Pleura: No acute airspace disease. Mild emphysema. Bilateral bronchial wall  thickening. Subpleural left lower lobe nodule or nodular focus of consolidation with mild surrounding ground-glass disease measuring up to 12 mm, series 5, image 115. Upper Abdomen: See separately dictated abdominopelvic CT Musculoskeletal: No acute osseous abnormality Review of the MIP images confirms the above findings. IMPRESSION: 1. Negative for acute pulmonary embolus. 2. Emphysema with bilateral bronchial wall thickening. 3. 12 mm subpleural left lower lobe nodule or nodular focus of consolidation with mild surrounding ground-glass disease. This may be infectious or inflammatory, but recommend CT chest follow-up in 3 months to ensure resolution. 4. Aortic atherosclerosis. Aortic Atherosclerosis (ICD10-I70.0) and Emphysema (ICD10-J43.9). Electronically Signed   By: Luke Bun M.D.   On: 10/09/2024 15:17   DG Chest 2 View Result Date: 10/09/2024 EXAM: 2 VIEW(S) XRAY OF THE CHEST 10/09/2024 08:39:00 AM COMPARISON: 06/02/2024 CLINICAL HISTORY: Chest pain and jaw pain. FINDINGS: LUNGS AND PLEURA: No focal pulmonary opacity. No pleural effusion. No pneumothorax. HEART AND MEDIASTINUM: No acute abnormality of the cardiac and mediastinal silhouettes. BONES AND SOFT TISSUES: Multilevel degenerative changes of thoracic spine. IMPRESSION: 1. No acute cardiopulmonary abnormality. Electronically signed by: Taylor Stroud MD 10/09/2024 08:49 AM EST RP Workstation: [id number]    Scheduled Meds:  apixaban   5 mg Oral BID   aspirin   81 mg Oral q AM   atenolol   50 mg Oral BID   atorvastatin   80 mg Oral QHS   ezetimibe   10 mg Oral QHS   FLUoxetine   20 mg Oral Daily   fluticasone  furoate-vilanterol  1 puff Inhalation Daily   gabapentin   400 mg Oral TID   insulin  aspart  0-15 Units Subcutaneous TID WC   insulin  aspart  0-5 Units Subcutaneous QHS   [START ON 10/11/2024] insulin  glargine-yfgn  20 Units Subcutaneous QHS   levothyroxine   88 mcg Oral Q0600   losartan   37.5 mg Oral Daily   melatonin  5 mg Oral QHS    pantoprazole   40 mg Oral Daily   QUEtiapine   12.5 mg Oral QHS   rOPINIRole   0.25 mg Oral QHS   sodium chloride  flush  3 mL Intravenous Q12H  umeclidinium bromide   1 puff Inhalation Daily   Continuous Infusions:  cefTRIAXone  (ROCEPHIN )  IV 2 g (10/10/24 2232)   doxycycline  (VIBRAMYCIN ) IV 125 mL/hr at 10/10/24 1830     Unresulted Labs (From admission, onward)     Start     Ordered   10/10/24 2305  MRSA Next Gen by PCR, Nasal  (MRSA Screening)  Once,   R       Question Answer Comment  Patient immune status Normal   Release to patient Immediate      10/10/24 2304   10/09/24 1706  HIV Antibody (routine testing w rflx)  (HIV Antibody (Routine testing w reflex) panel)  Once,   R        10/09/24 1723   10/09/24 1257  Urine Culture  Add-on,   AD       Question:  Indication  Answer:  Flank Pain   10/09/24 1256             LOS:  LOS: 0 days   Time Spent: 45 minutes  Emileo Semel Al-Sultani, MD [facility]  If 7PM-7AM, please contact night-coverage  10/10/2024, 11:21 PM      "

## 2024-10-11 DIAGNOSIS — N1831 Chronic kidney disease, stage 3a: Secondary | ICD-10-CM | POA: Diagnosis not present

## 2024-10-11 DIAGNOSIS — I8290 Acute embolism and thrombosis of unspecified vein: Secondary | ICD-10-CM | POA: Diagnosis not present

## 2024-10-11 DIAGNOSIS — E1122 Type 2 diabetes mellitus with diabetic chronic kidney disease: Secondary | ICD-10-CM | POA: Diagnosis not present

## 2024-10-11 DIAGNOSIS — J189 Pneumonia, unspecified organism: Secondary | ICD-10-CM | POA: Diagnosis not present

## 2024-10-11 LAB — GLUCOSE, CAPILLARY
Glucose-Capillary: 147 mg/dL — ABNORMAL HIGH (ref 70–99)
Glucose-Capillary: 180 mg/dL — ABNORMAL HIGH (ref 70–99)
Glucose-Capillary: 114 mg/dL — ABNORMAL HIGH (ref 70–99)
Glucose-Capillary: 191 mg/dL — ABNORMAL HIGH (ref 70–99)

## 2024-10-11 LAB — BASIC METABOLIC PANEL WITH GFR
Anion gap: 8 (ref 5–15)
BUN: 20 mg/dL (ref 6–20)
CO2: 30 mmol/L (ref 22–32)
Calcium: 9.1 mg/dL (ref 8.9–10.3)
Chloride: 100 mmol/L (ref 98–111)
Creatinine, Ser: 1.17 mg/dL — ABNORMAL HIGH (ref 0.44–1.00)
GFR, Estimated: 56 mL/min — ABNORMAL LOW
Glucose, Bld: 132 mg/dL — ABNORMAL HIGH (ref 70–99)
Potassium: 4.4 mmol/L (ref 3.5–5.1)
Sodium: 138 mmol/L (ref 135–145)

## 2024-10-11 LAB — CBC
HCT: 38.3 % (ref 36.0–46.0)
Hemoglobin: 12.3 g/dL (ref 12.0–15.0)
MCH: 27.8 pg (ref 26.0–34.0)
MCHC: 32.1 g/dL (ref 30.0–36.0)
MCV: 86.7 fL (ref 80.0–100.0)
Platelets: 210 K/uL (ref 150–400)
RBC: 4.42 MIL/uL (ref 3.87–5.11)
RDW: 13.1 % (ref 11.5–15.5)
WBC: 8 K/uL (ref 4.0–10.5)
nRBC: 0 % (ref 0.0–0.2)

## 2024-10-11 LAB — MRSA NEXT GEN BY PCR, NASAL: MRSA by PCR Next Gen: NOT DETECTED

## 2024-10-11 MED ORDER — IPRATROPIUM-ALBUTEROL 0.5-2.5 (3) MG/3ML IN SOLN: 3.0000 mL | RESPIRATORY_TRACT | Status: DC | PRN

## 2024-10-11 MED ORDER — OXYCODONE HCL 5 MG PO TABS
5.0000 mg | ORAL_TABLET | Freq: Three times a day (TID) | ORAL | Status: DC | PRN
  Administered 2024-10-11 – 2024-10-12 (×4): 5 mg via ORAL
  Filled 2024-10-11 (×4): qty 1

## 2024-10-11 MED ORDER — DOXYCYCLINE HYCLATE 100 MG PO TABS
100.0000 mg | ORAL_TABLET | Freq: Two times a day (BID) | ORAL | Status: AC
  Administered 2024-10-11 – 2024-10-13 (×5): 100 mg via ORAL
  Filled 2024-10-11 (×5): qty 1

## 2024-10-11 NOTE — Progress Notes (Signed)
 " PROGRESS NOTE    Karla Hoover  FMW:[id number] DOB: 1971-05-29 DOA: 10/09/2024 PCP: Pcp, No    Brief Narrative:    Patient is a 54 year old female with PMHx significant for CVA with LUE hemiparesis, HFpEF, COPD, chronic hypoxic respiratory failure on 2 L PRN, PE/DVT on Eliquis , HTN, HLD, CKD stage IIIa, T2DM, hypothyroidism, anxiety, GERD, QTc prolongation who presented to the ED on 10/10/2024 complaining of several days of left-sided chest pain.  Reports intermittent left-sided chest pain, pleuritic in nature, inhibiting deep breaths, radiating to the left shoulder and left jaw.  She reports symptoms are similar to when she was diagnosed with PE in the first time.  Also reporting URI symptoms as well as a sensation of cold feet and legs which she reports usually correlates with her having a UTI, though she denies dysuria but reports foul smelling urine.   In the ED, temp was 98.5 F, RR 22, HR 83, BP 115/103.  She was initially on RA, but desatted to the 80s requiring placement of 2 L [place] (which is her home baseline).  CBC showed mild leukocytosis to 12.2.  BMP showed K5.1, creatinine 1.31.  High-sensitivity troponins 24 > 25.  UA showed cloudy urine with large leukocytes, >50 WBCs, and few bacteria.  4 Plex RVP was negative.  Lipase is 36.  Initial lactate 2.1 improved 12.3.  CXR showed no acute cardiopulmonary abnormality.    CTA chest was negative for PE but showed emphysema with bilateral bronchial wall thickening, 12 mm subpleural LLL nodule or nodular consolidation with surrounding ground glass disease possibly infectious versus inflammatory.  Recommended repeat CT in 3 months to ensure resolution.    CT abdomen pelvis with contrast showed no acute intra-abdominal or pelvic abnormality, but noted small focus of gas in the urinary bladder.  Patient was given fentanyl , morphine , Rocephin , and doxycycline  in the ED.  The patient was admitted for pneumonia and UTI.  Assessment and  Plan:  #Acute on chronic hypoxic respiratory failure #Community-acquired pneumonia - Patient presented with 3 days of pleuritic left-sided chest pain with radiation to her left shoulder and jaw - At home, only uses O2 on exertion and for sleep. While in the ED, desatted to 80s at rest and was placed on 2L [place]  - High-sensitivity troponins flat 24 > 25 - CTA negative for PE, showed subpleural LLL nodule with ground-glass disease - Afebrile, leukocytosis resolved - Continue Rocephin  and doxycycline  - Dessated to 87% overnight while sleeping on RA, was placed on 2L [place] - Wean O2 as tolerated, goal SpO2 88-92%  # Possible UTI - History of prior ESBL E. Coli UTIs  - On chronic suppression therapy with macrobid  - Leukocytosis resolved - Urine culture positive for E. Coli, susceptibilities pending  - Will continue for now given clinical improvement pending final cultures  # COPD - Continue home Incruse - Replace home Advair with formulary Breo - PRN DuoNebs q4h  # Hypertension - Continue home atenolol , Losartan    # Hyperlipidemia - Continue home atorvastatin , Zetia    # T2DM - Home regimen includes 38U long-acting insulin  nightly - Continue Lantus  20 units nightly - SSI 0-15 AC, 0-5 HS - ACHS Accu Checks   # CKD 3A - Creatinine 1.17  (baseline 0.9-1.1) - Continue to monitor for now    # Hypothyroidism - Continue home Synthroid    # History of CVA with residual LUE hemiparesis - Continue home aspirin , atorvastatin , Zetia  - Continue Eliquis    # History of DVT and  PE - Continue Eliquis    # Chronic HFpEF -  TTE 10/2023 and showed EF 55-60%, G1 DD, normal RV function. - Not currently on diuretic - Euvolemic on exam   # Depression # Anxiety - Continue home fluoxetine  and Seroquel    Scheduled Meds:  apixaban   5 mg Oral BID   aspirin   81 mg Oral q AM   atenolol   50 mg Oral BID   atorvastatin   80 mg Oral QHS   ezetimibe   10 mg Oral QHS   FLUoxetine   20 mg Oral Daily    fluticasone  furoate-vilanterol  1 puff Inhalation Daily   gabapentin   400 mg Oral TID   insulin  aspart  0-15 Units Subcutaneous TID WC   insulin  aspart  0-5 Units Subcutaneous QHS   insulin  glargine-yfgn  20 Units Subcutaneous QHS   levothyroxine   88 mcg Oral Q0600   losartan   37.5 mg Oral Daily   melatonin  5 mg Oral QHS   pantoprazole   40 mg Oral Daily   QUEtiapine   12.5 mg Oral QHS   rOPINIRole   0.25 mg Oral QHS   sodium chloride  flush  3 mL Intravenous Q12H   umeclidinium bromide   1 puff Inhalation Daily   Continuous Infusions:  cefTRIAXone  (ROCEPHIN )  IV 2 g (10/10/24 2232)   doxycycline  (VIBRAMYCIN ) IV 100 mg (10/11/24 0600)   PRN Meds:.acetaminophen  **OR** acetaminophen , albuterol , LORazepam , polyethylene glycol  Current Outpatient Medications  Medication Instructions   acetaminophen  (TYLENOL ) 1,000 mg, 3 times daily   albuterol  (VENTOLIN  HFA) 108 (90 Base) MCG/ACT inhaler 2 puffs, Inhalation, Every 6 hours PRN   apixaban  (ELIQUIS ) 5 MG TABS tablet Take 1 tablet (5mg ) twice daily   aspirin  81 mg, Every morning   atenolol  (TENORMIN ) 50 mg, Oral, 2 times daily   atorvastatin  (LIPITOR ) 80 mg, Oral, Daily   barrier cream (NON-SPECIFIED) CREA 1 Application, 2 times daily   chlorhexidine  (PERIDEX ) 0.12 % solution 15 mLs, Mouth/Throat, Daily   cyclobenzaprine  (FLEXERIL ) 10 mg, Oral, 3 times daily PRN   diclofenac  Sodium (VOLTAREN ) 2 g, Topical, 2 times daily   Emollient (EUCERIN) lotion 1 Application, As needed   EPINEPHrine (EPIPEN 2-PAK) 0.3 mg, Daily PRN   ezetimibe  (ZETIA ) 10 mg, Daily at bedtime   famotidine  (PEPCID ) 20 mg, Oral, 2 times daily   Fish Oil 1,000 mg, Daily at bedtime   FLUoxetine  (PROZAC ) 20 mg, Daily   fluticasone -salmeterol (ADVAIR) 100-50 MCG/ACT AEPB 1 puff, 2 times daily   gabapentin  (NEURONTIN ) 400 mg, Oral, 3 times daily   hydrALAZINE  (APRESOLINE ) 10 mg, Every 12 hours PRN   INCRUSE ELLIPTA  62.5 MCG/ACT AEPB 1 puff, Daily   insulin  lispro (HUMALOG )  0-14 Units, See admin instructions   LACTOBACILLUS PO 1 capsule, Daily   Lantus  SoloStar 38 Units, Subcutaneous, Daily at bedtime   levothyroxine  (SYNTHROID ) 88 mcg, Every morning   LORazepam  (ATIVAN ) 0.5 MG tablet Take 0.5 mg by mouth 2 (two) times daily.   losartan  (COZAAR ) 37.5 mg, Daily   Macrobid  100 mg, Every morning   Melatonin 7.5 mg, Daily at bedtime   Menthol , Topical Analgesic, (BIOFREEZE COOL THE PAIN) 4 % GEL 1 Application, Topical, 2 times daily   Nutritional Supplements (BOOST GLUCOSE CONTROL PO) 240 mLs, Oral, 2 times daily   ondansetron  (ZOFRAN ) 4 mg, Every 8 hours PRN   OXYGEN  2 L/min, As needed   polyethylene glycol powder (GLYCOLAX /MIRALAX ) 17 g, Daily   QUEtiapine  (SEROQUEL ) 25 mg, Oral, Daily at bedtime   rOPINIRole  (REQUIP ) 0.25 mg, Oral,  Daily at bedtime   senna-docusate (SENOKOT-S) 8.6-50 MG tablet 1 tablet, Oral, 2 times daily   Trulicity  3 mg, Subcutaneous, Every 7 days   Vitamin D3 50,000 Units, Every Thu   witch hazel-glycerin (TUCKS) pad 1 Application, Every 4 hours PRN    DVT prophylaxis:  apixaban  (ELIQUIS ) tablet 5 mg   Code Status:   Code Status: Full Code  Family Communication: None  Disposition Plan: Back to her facility pending clinical improvement PT -   -   OT -   -    Level of care: Telemetry  Consultants:  None  Procedures:  None  Antimicrobials: None   Subjective: NAEO. Continues to complain of body aches and chills. States she's not feeling well.   Objective: Vitals:   10/10/24 1653 10/10/24 2003 10/11/24 0537 10/11/24 0607  BP: (!) 142/93 (!) 145/79 116/69   Pulse: 87 80 81 80  Resp: 18 18 18    Temp: 98.9 F (37.2 C) 98.8 F (37.1 C) 98.7 F (37.1 C)   TempSrc:  Oral Oral   SpO2: 92% 90% (!) 87% 92%  Weight:      Height:        Intake/Output Summary (Last 24 hours) at 10/11/2024 0646 Last data filed at 10/10/2024 1830 Gross per 24 hour  Intake 450.23 ml  Output 0 ml  Net 450.23 ml   Filed Weights   10/09/24  0754  Weight: 85.7 kg    Examination:  Gen: NAD, A&Ox3 HEENT: NCAT, EOMI Neck: Supple, no JVD, no LAD CV: RRR, no murmurs Resp: normal WOB, CTAB, no w/r/r Abd: Soft, NTND, no guarding, BS normoactive Ext: No LE edema, pulses 2+ b/l Skin: Warm, dry, no rashes/lesions Neuro: CN II-XII grossly intact, strength 5/5 b/l, sensation intact Psych: Calm, cooperative, appropriate affect    Data Reviewed: I have personally reviewed following labs and imaging studies  CBC: Recent Labs  Lab 10/09/24 0800 10/10/24 0245  WBC 12.2* 10.2  HGB 14.0 13.0  HCT 44.5 41.1  MCV 86.6 87.8  PLT 250 232   Basic Metabolic Panel: Recent Labs  Lab 10/09/24 0800 10/10/24 0245 10/11/24 0417  NA 134* 136 138  K 5.1 4.6 4.4  CL 95* 97* 100  CO2 26 31 30   GLUCOSE 224* 125* 132*  BUN 29* 25* 20  CREATININE 1.31* 1.36* 1.17*  CALCIUM  10.2 9.5 9.1   GFR: Estimated Creatinine Clearance: 63.9 mL/min (A) (by C-G formula based on SCr of 1.17 mg/dL (H)). Liver Function Tests: Recent Labs  Lab 10/09/24 1256 10/10/24 0245  AST 25 20  ALT 8 8  ALKPHOS 79 72  BILITOT 0.3 0.2  PROT 7.6 6.8  ALBUMIN 4.0 3.6   Recent Labs  Lab 10/09/24 1256  LIPASE 36   No results for input(s): AMMONIA in the last 168 hours. Coagulation Profile: No results for input(s): INR, PROTIME in the last 168 hours. Cardiac Enzymes: No results for input(s): CKTOTAL, CKMB, CKMBINDEX, TROPONINI in the last 168 hours. BNP (last 3 results) No results for input(s): PROBNP in the last 8760 hours. HbA1C: No results for input(s): HGBA1C in the last 72 hours. CBG: Recent Labs  Lab 10/10/24 0807 10/10/24 1150 10/10/24 1624 10/10/24 1652 10/10/24 2004  GLUCAP 136* 102* 217* 189* 212*   Lipid Profile: No results for input(s): CHOL, HDL, LDLCALC, TRIG, CHOLHDL, LDLDIRECT in the last 72 hours. Thyroid Function Tests: No results for input(s): TSH, T4TOTAL, FREET4, T3FREE,  THYROIDAB in the last 72 hours. Anemia Panel: No results  for input(s): VITAMINB12, FOLATE, FERRITIN, TIBC, IRON, RETICCTPCT in the last 72 hours. Sepsis Labs: Recent Labs  Lab 10/09/24 1407 10/09/24 1503  LATICACIDVEN 2.1* 1.3    Recent Results (from the past 240 hours)  Resp panel by RT-PCR (RSV, Flu A&B, Covid) Anterior Nasal Swab     Status: None   Collection Time: 10/09/24 12:37 PM   Specimen: Anterior Nasal Swab  Result Value Ref Range Status   SARS Coronavirus 2 by RT PCR NEGATIVE NEGATIVE Final   Influenza A by PCR NEGATIVE NEGATIVE Final   Influenza B by PCR NEGATIVE NEGATIVE Final    Comment: (NOTE) The Xpert Xpress SARS-CoV-2/FLU/RSV plus assay is intended as an aid in the diagnosis of influenza from Nasopharyngeal swab specimens and should not be used as a sole basis for treatment. Nasal washings and aspirates are unacceptable for Xpert Xpress SARS-CoV-2/FLU/RSV testing.  Fact Sheet for Patients: bloggercourse.com  Fact Sheet for Healthcare Providers: seriousbroker.it  This test is not yet approved or cleared by the [place]  FDA and has been authorized for detection and/or diagnosis of SARS-CoV-2 by FDA under an Emergency Use Authorization (EUA). This EUA will remain in effect (meaning this test can be used) for the duration of the COVID-19 declaration under Section 564(b)(1) of the Act, 21 U.S.C. section 360bbb-3(b)(1), unless the authorization is terminated or revoked.     Resp Syncytial Virus by PCR NEGATIVE NEGATIVE Final    Comment: (NOTE) Fact Sheet for Patients: bloggercourse.com  Fact Sheet for Healthcare Providers: seriousbroker.it  This test is not yet approved or cleared by the [place]  FDA and has been authorized for detection and/or diagnosis of SARS-CoV-2 by FDA under an Emergency Use Authorization (EUA). This EUA will  remain in effect (meaning this test can be used) for the duration of the COVID-19 declaration under Section 564(b)(1) of the Act, 21 U.S.C. section 360bbb-3(b)(1), unless the authorization is terminated or revoked.  Performed at [facility], [address], [place], [place] [address]   MRSA Next Gen by PCR, Nasal     Status: None   Collection Time: 10/10/24 11:05 PM   Specimen: Nasal Mucosa; Nasal Swab  Result Value Ref Range Status   MRSA by PCR Next Gen NOT DETECTED NOT DETECTED Final    Comment: (NOTE) The GeneXpert MRSA Assay (FDA approved for NASAL specimens only), is one component of a comprehensive MRSA colonization surveillance program. It is not intended to diagnose MRSA infection nor to guide or monitor treatment for MRSA infections. Test performance is not FDA approved in patients less than 60 years old. Performed at [facility], [address] [place], [place] [address]      Radiology Studies: CT ABDOMEN PELVIS W CONTRAST Result Date: 10/09/2024 CLINICAL DATA:  Abdominal pain EXAM: CT ABDOMEN AND PELVIS WITH CONTRAST TECHNIQUE: Multidetector CT imaging of the abdomen and pelvis was performed using the standard protocol following bolus administration of intravenous contrast. RADIATION DOSE REDUCTION: This exam was performed according to the departmental dose-optimization program which includes automated exposure control, adjustment of the mA and/or kV according to patient size and/or use of iterative reconstruction technique. CONTRAST:  75mL OMNIPAQUE  IOHEXOL  350 MG/ML SOLN COMPARISON:  MRI 02/17/2024, CT 10/21/2023 FINDINGS: Lower chest: Lung bases demonstrate focal nodular density in the subpleural left lower lobe. See separately dictated chest CT Hepatobiliary: No focal liver abnormality is seen. No gallstones, gallbladder wall thickening, or biliary dilatation. Pancreas: Unremarkable. No pancreatic ductal dilatation or surrounding inflammatory changes. Spleen:  Normal in size without focal abnormality. Adrenals/Urinary Tract: Adrenal glands are normal. No hydronephrosis. Cortical scarring and atrophy of the right kidney. Cortical scarring of the upper pole left kidney. Focal hypodensity right kidney best seen on coronal series 7, image 37 measuring 10 mm, appears more low-density today, on the prior exam measured 16 mm. Bladder is unremarkable except for small focus of gas Stomach/Bowel: Stomach within normal limits. No dilated small bowel. No acute bowel wall thickening. Negative appendix Vascular/Lymphatic: Aortic atherosclerosis. No enlarged abdominal or pelvic lymph nodes. Reproductive: Uterus and bilateral adnexa are unremarkable. Other: No ascites or free air Musculoskeletal: No acute or suspicious osseous abnormality IMPRESSION: 1. No CT evidence for acute intra-abdominal or pelvic abnormality. 2. Cortical scarring and atrophy of the right kidney. Cortical scarring of the upper pole left kidney. Focal hypodensity in the right kidney appears more low-density today and less conspicuous than on the prior exam, reference MRI 02/17/2024 for additional follow-up recommendations 3. Small focus of gas in the urinary bladder, correlate for recent instrumentation. 4. Aortic atherosclerosis. Aortic Atherosclerosis (ICD10-I70.0). Electronically Signed   By: Luke Bun M.D.   On: 10/09/2024 15:39   CT Angio Chest PE W and/or Wo Contrast Result Date: 10/09/2024 CLINICAL DATA:  Abdominal pain chest pain EXAM: CT ANGIOGRAPHY CHEST WITH CONTRAST TECHNIQUE: Multidetector CT imaging of the chest was performed using the standard protocol during bolus administration of intravenous contrast. Multiplanar CT image reconstructions and MIPs were obtained to evaluate the vascular anatomy. RADIATION DOSE REDUCTION: This exam was performed according to the departmental dose-optimization program which includes automated exposure control, adjustment of the mA and/or kV according to patient  size and/or use of iterative reconstruction technique. CONTRAST:  75mL OMNIPAQUE  IOHEXOL  350 MG/ML SOLN COMPARISON:  Chest x-ray 10/09/2024, chest CT 06/02/2024 FINDINGS: Cardiovascular: Satisfactory opacification of the pulmonary arteries to the segmental level. No evidence of pulmonary embolism. Mild atherosclerosis. Negative for aortic aneurysm. Multi vessel coronary vascular calcification. Normal cardiac size. No pericardial effusion Mediastinum/Nodes: Patent trachea. No suspicious thyroid mass. No suspicious lymph nodes. Esophagus within normal limits. Lungs/Pleura: No acute airspace disease. Mild emphysema. Bilateral bronchial wall thickening. Subpleural left lower lobe nodule or nodular focus of consolidation with mild surrounding ground-glass disease measuring up to 12 mm, series 5, image 115. Upper Abdomen: See separately dictated abdominopelvic CT Musculoskeletal: No acute osseous abnormality Review of the MIP images confirms the above findings. IMPRESSION: 1. Negative for acute pulmonary embolus. 2. Emphysema with bilateral bronchial wall thickening. 3. 12 mm subpleural left lower lobe nodule or nodular focus of consolidation with mild surrounding ground-glass disease. This may be infectious or inflammatory, but recommend CT chest follow-up in 3 months to ensure resolution. 4. Aortic atherosclerosis. Aortic Atherosclerosis (ICD10-I70.0) and Emphysema (ICD10-J43.9). Electronically Signed   By: Luke Bun M.D.   On: 10/09/2024 15:17   DG Chest 2 View Result Date: 10/09/2024 EXAM: 2 VIEW(S) XRAY OF THE CHEST 10/09/2024 08:39:00 AM COMPARISON: 06/02/2024 CLINICAL HISTORY: Chest pain and jaw pain. FINDINGS: LUNGS AND PLEURA: No focal pulmonary opacity. No pleural effusion. No pneumothorax. HEART AND MEDIASTINUM: No acute abnormality of the cardiac and mediastinal silhouettes. BONES AND SOFT TISSUES: Multilevel degenerative changes of thoracic spine. IMPRESSION: 1. No acute cardiopulmonary abnormality.  Electronically signed by: Waddell Calk MD 10/09/2024 08:49 AM EST RP Workstation: [id number]    Scheduled Meds:  apixaban   5 mg Oral BID   aspirin   81 mg Oral q AM   atenolol   50 mg Oral BID   atorvastatin   80  mg Oral QHS   ezetimibe   10 mg Oral QHS   FLUoxetine   20 mg Oral Daily   fluticasone  furoate-vilanterol  1 puff Inhalation Daily   gabapentin   400 mg Oral TID   insulin  aspart  0-15 Units Subcutaneous TID WC   insulin  aspart  0-5 Units Subcutaneous QHS   insulin  glargine-yfgn  20 Units Subcutaneous QHS   levothyroxine   88 mcg Oral Q0600   losartan   37.5 mg Oral Daily   melatonin  5 mg Oral QHS   pantoprazole   40 mg Oral Daily   QUEtiapine   12.5 mg Oral QHS   rOPINIRole   0.25 mg Oral QHS   sodium chloride  flush  3 mL Intravenous Q12H   umeclidinium bromide   1 puff Inhalation Daily   Continuous Infusions:  cefTRIAXone  (ROCEPHIN )  IV 2 g (10/10/24 2232)   doxycycline  (VIBRAMYCIN ) IV 100 mg (10/11/24 0600)     Unresulted Labs (From admission, onward)     Start     Ordered   10/12/24 0500  CBC  Tomorrow morning,   R       Question:  Specimen collection method  Answer:  Lab=Lab collect   10/11/24 2203   10/12/24 0500  Basic metabolic panel with GFR  Tomorrow morning,   R       Question:  Specimen collection method  Answer:  Lab=Lab collect   10/11/24 2203   10/09/24 1706  HIV Antibody (routine testing w rflx)  (HIV Antibody (Routine testing w reflex) panel)  Once,   R        10/09/24 1723             LOS:  LOS: 0 days   Time Spent: 45 minutes  Josuha Fontanez Al-Sultani, MD [facility]  If 7PM-7AM, please contact night-coverage  10/11/2024, 6:46 AM      "

## 2024-10-11 NOTE — TOC Initial Note (Signed)
 Transition of Care [facility]) - Initial/Assessment Note    Patient Details  Name: Karla Hoover MRN: [id number] Date of Birth: 1971-06-21  Transition of Care [facility]) CM/SW Contact:    Lendia Dais, LCSWA Phone Number: 10/11/2024, 2:30 PM  Clinical Narrative:  Pt is from [facility] and will return upon discharge. CSW confirmed with Leontine of [place].  Pt is indep w/ ADL's , doesn't drive, and has the DME of a walker. Pt stated that they are pending to hear back from [facility] and will then dc from [facility].  Pt stated that Sharlet (mother) was their emergency contact and gave the CSW verbal permission to contact with updates.   Pt stated they are able to afford basic needs with income of disability SSI.   CSW will continue to monitor for dc.               Expected Discharge Plan: Skilled Nursing Facility Barriers to Discharge: Continued Medical Work up   Patient Goals and CMS Choice Patient states their goals for this hospitalization and ongoing recovery are:: [employer]   Choice offered to / list presented to : NA      Expected Discharge Plan and Services In-house Referral: Clinical Social Work     Living arrangements for the past 2 months: Skilled Holiday Representative                                      Prior Living Arrangements/Services Living arrangements for the past 2 months: Skilled Nursing Facility Lives with:: Facility Resident Patient language and need for interpreter reviewed:: Yes Do you feel safe going back to the place where you live?: Yes      Need for Family Participation in Patient Care: No (Comment) Care giver support system in place?: No (comment) Current home services: DME Criminal Activity/Legal Involvement Pertinent to Current Situation/Hospitalization: No - Comment as needed  Activities of Daily Living   ADL Screening (condition at time of admission) Independently performs ADLs?: Yes (appropriate for developmental  age) Is the patient deaf or have difficulty hearing?: No Does the patient have difficulty seeing, even when wearing glasses/contacts?: No Does the patient have difficulty concentrating, remembering, or making decisions?: No  Permission Sought/Granted Permission sought to share information with : Family Supports Permission granted to share information with : Yes, Verbal Permission Granted  Share Information with NAME: [name]     Permission granted to share info w Relationship: Mother  Permission granted to share info w Contact Information: [phone]  Emotional Assessment Appearance:: Appears stated age Attitude/Demeanor/Rapport: Engaged Affect (typically observed): Appropriate, Pleasant Orientation: : Oriented to Self, Oriented to Situation, Oriented to Place, Oriented to  Time Alcohol / Substance Use: Not Applicable Psych Involvement: No (comment)  Admission diagnosis:  Atypical chest pain [R07.89] Hypoxia [R09.02] CAP (community acquired pneumonia) [J18.9] Abdominal pain, unspecified abdominal location [R10.9] Urinary tract infection without hematuria, site unspecified [N39.0] Pneumonia due to infectious organism, unspecified laterality, unspecified part of lung [J18.9] Patient Active Problem List   Diagnosis Date Noted   CAP (community acquired pneumonia) 10/09/2024   Aggressive behavior 10/17/2023   Prolonged QT interval 10/17/2023   UTI due to extended-spectrum beta lactamase (ESBL) producing Escherichia coli 09/06/2023   Acute on chronic respiratory failure with hypoxia (HCC) 09/06/2023   Hypotension 09/06/2023   Leukocytosis 01/21/2023   Class 1 obesity due to excess calories with body mass index (BMI) of  32.0 to 32.9 in adult 12/18/2022   Dyslipidemia 12/18/2022   Hyponatremia 12/13/2022   CKD (chronic kidney disease) stage 3, GFR 30-59 ml/min (HCC) 12/13/2022   Hemiparesis affecting left side as late effect of cerebrovascular accident (CVA) (HCC) 08/30/2022    Allergies    Grade I diastolic dysfunction 12/02/2021   Mild protein-calorie malnutrition 12/02/2021   Chronic respiratory failure with hypoxia (HCC) 02/09/2021   Type 2 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 disease, with long-term current use of insulin  (HCC) 02/09/2021   Mixed 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 (HCC) 02/09/2021   GERD without esophagitis 02/09/2021   Hypothyroidism 02/09/2021   Lymphadenopathy 12/28/2020   hx of PE/DVT 12/01/2019   Overweight (BMI 25.0-29.9) 11/22/2019   Tobacco abuse 11/16/2019   History of stroke 11/15/2019   Depression 02/15/2018   Anxiety 01/24/2017   Essential hypertension 05/04/2009   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 [place] [address] Phone: [phone] Fax: [phone]     Social Drivers of Health (SDOH) Social History: SDOH Screenings   Food Insecurity: No Food Insecurity (10/10/2024)  Housing: Low Risk (10/10/2024)  Transportation Needs: No Transportation Needs (10/10/2024)  Utilities: Not At Risk (10/10/2024)  Social Connections: Not on File (06/28/2023)   [facility] from [facility]  Tobacco Use: Medium Risk (10/09/2024)   SDOH Interventions:     Readmission Risk Interventions    09/10/2023   10:01 AM 01/21/2023   12:08 PM 12/18/2022   11:10 AM  Readmission Risk Prevention Plan  Transportation Screening Complete Complete Complete  PCP or Specialist Appt within 5-7 Days  Complete   PCP or Specialist Appt within 3-5 Days Complete  Complete  Home Care Screening  Complete   Medication Review (RN CM)  Complete   HRI or Home Care Consult Complete  Complete  Social Work Consult for Recovery Care Planning/Counseling Complete  Complete  Palliative Care Screening Not Applicable  Not Applicable  Medication Review Ocean[employer]) Complete  Complete

## 2024-10-11 NOTE — Progress Notes (Signed)
 Patient refusing the heart monitor. Patient states it gets in her way. MD is aware.

## 2024-10-12 DIAGNOSIS — I8290 Acute embolism and thrombosis of unspecified vein: Secondary | ICD-10-CM | POA: Diagnosis not present

## 2024-10-12 DIAGNOSIS — N1831 Chronic kidney disease, stage 3a: Secondary | ICD-10-CM | POA: Diagnosis not present

## 2024-10-12 DIAGNOSIS — E1122 Type 2 diabetes mellitus with diabetic chronic kidney disease: Secondary | ICD-10-CM | POA: Diagnosis not present

## 2024-10-12 DIAGNOSIS — J189 Pneumonia, unspecified organism: Secondary | ICD-10-CM | POA: Diagnosis not present

## 2024-10-12 LAB — BASIC METABOLIC PANEL WITH GFR
Anion gap: 8 (ref 5–15)
BUN: 16 mg/dL (ref 6–20)
CO2: 30 mmol/L (ref 22–32)
Calcium: 9.1 mg/dL (ref 8.9–10.3)
Chloride: 99 mmol/L (ref 98–111)
Creatinine, Ser: 1.2 mg/dL — ABNORMAL HIGH (ref 0.44–1.00)
GFR, Estimated: 54 mL/min — ABNORMAL LOW
Glucose, Bld: 115 mg/dL — ABNORMAL HIGH (ref 70–99)
Potassium: 4.3 mmol/L (ref 3.5–5.1)
Sodium: 138 mmol/L (ref 135–145)

## 2024-10-12 LAB — URINE CULTURE: Culture: >=100000 — AB

## 2024-10-12 LAB — GLUCOSE, CAPILLARY
Glucose-Capillary: 126 mg/dL — ABNORMAL HIGH (ref 70–99)
Glucose-Capillary: 179 mg/dL — ABNORMAL HIGH (ref 70–99)
Glucose-Capillary: 144 mg/dL — ABNORMAL HIGH (ref 70–99)
Glucose-Capillary: 110 mg/dL — ABNORMAL HIGH (ref 70–99)

## 2024-10-12 LAB — CBC
HCT: 37.6 % (ref 36.0–46.0)
Hemoglobin: 12 g/dL (ref 12.0–15.0)
MCH: 27.3 pg (ref 26.0–34.0)
MCHC: 31.9 g/dL (ref 30.0–36.0)
MCV: 85.5 fL (ref 80.0–100.0)
Platelets: 185 K/uL (ref 150–400)
RBC: 4.4 MIL/uL (ref 3.87–5.11)
RDW: 12.8 % (ref 11.5–15.5)
WBC: 8.5 K/uL (ref 4.0–10.5)
nRBC: 0 % (ref 0.0–0.2)

## 2024-10-12 MED ORDER — SODIUM CHLORIDE 0.9 % IV SOLN
1.0000 g | Freq: Three times a day (TID) | INTRAVENOUS | Status: DC
  Administered 2024-10-12 – 2024-10-14 (×5): 1 g via INTRAVENOUS
  Filled 2024-10-12 (×6): qty 20

## 2024-10-12 NOTE — Progress Notes (Addendum)
 " PROGRESS NOTE    Karla Hoover  FMW:[id number] DOB: [birth year] DOA: 10/09/2024 PCP: Pcp, No    Brief Narrative:    Patient is a 54 year old female with PMHx significant for CVA with LUE hemiparesis, HFpEF, COPD, chronic hypoxic respiratory failure on 2 L PRN, PE/DVT on Eliquis , HTN, HLD, CKD stage IIIa, T2DM, hypothyroidism, anxiety, GERD, QTc prolongation who presented to the ED on 10/10/2024 complaining of several days of left-sided chest pain.  Reports intermittent left-sided chest pain, pleuritic in nature, inhibiting deep breaths, radiating to the left shoulder and left jaw.  She reports symptoms are similar to when she was diagnosed with PE in the first time.  Also reporting URI symptoms as well as a sensation of cold feet and legs which she reports usually correlates with her having a UTI, though she denies dysuria but reports foul smelling urine.   In the ED, temp was 98.5 F, RR 22, HR 83, BP 115/103.  She was initially on RA, but desatted to the 80s requiring placement of 2 L [place] (which is her home baseline).  CBC showed mild leukocytosis to 12.2.  BMP showed K5.1, creatinine 1.31.  High-sensitivity troponins 24 > 25.  UA showed cloudy urine with large leukocytes, >50 WBCs, and few bacteria.  4 Plex RVP was negative.  Lipase is 36.  Initial lactate 2.1 improved 12.3.  CXR showed no acute cardiopulmonary abnormality.    CTA chest was negative for PE but showed emphysema with bilateral bronchial wall thickening, 12 mm subpleural LLL nodule or nodular consolidation with surrounding ground glass disease possibly infectious versus inflammatory.  Recommended repeat CT in 3 months to ensure resolution.    CT abdomen pelvis with contrast showed no acute intra-abdominal or pelvic abnormality, but noted small focus of gas in the urinary bladder.  Patient was given fentanyl , morphine , Rocephin , and doxycycline  in the ED.  The patient was admitted for pneumonia and UTI.  Assessment and Plan:  #  Acute on chronic hypoxic respiratory failure # Community-acquired pneumonia - Patient presented with 3 days of pleuritic left-sided chest pain with radiation to her left shoulder and jaw - At home, only uses O2 on exertion and for sleep. While in the ED, desatted to 80s at rest and was placed on 2L [place]  - High-sensitivity troponins flat 24 > 25 - CTA negative for PE, showed subpleural LLL nodule with ground-glass disease - Afebrile, leukocytosis resolved - Continue doxycycline . Rocephin  revised to meropenem  for ESBL. E. Coli in urine - Dessated to 88% overnight while sleeping on RA, was placed on 2L [place] - Wean O2 as tolerated, goal SpO2 88-92%  # Possible UTI - History of prior ESBL E. Coli UTIs  - On chronic suppression therapy with macrobid  - Leukocytosis resolved - Urine culture positive for ESBL E. Coli - Rocephin  revised to Meropenem  1 g q8h  # COPD - Continue home Incruse - Replace home Advair with formulary Breo - PRN DuoNebs q4h  # Hypertension - Continue home atenolol , Losartan    # Hyperlipidemia - Continue home atorvastatin , Zetia    # T2DM - Home regimen includes 38U long-acting insulin  nightly - Continue Lantus  20 units nightly - SSI 0-15 AC, 0-5 HS - ACHS Accu Checks   # CKD 3A - Creatinine 1.20  (baseline 0.9-1.1) - Continue to monitor for now    # Hypothyroidism - Continue home Synthroid    # History of CVA with residual LUE hemiparesis - Continue home aspirin , atorvastatin , Zetia  - Continue Eliquis    #  History of DVT and PE - Continue Eliquis    # Chronic HFpEF -  TTE 10/2023 and showed EF 55-60%, G1 DD, normal RV function. - Not currently on diuretic - Euvolemic on exam   # Depression # Anxiety - Continue home fluoxetine  and Seroquel    Scheduled Meds:  apixaban   5 mg Oral BID   aspirin   81 mg Oral q AM   atenolol   50 mg Oral BID   atorvastatin   80 mg Oral QHS   doxycycline   100 mg Oral Q12H   ezetimibe   10 mg Oral QHS   FLUoxetine   20 mg  Oral Daily   fluticasone  furoate-vilanterol  1 puff Inhalation Daily   gabapentin   400 mg Oral TID   insulin  aspart  0-15 Units Subcutaneous TID WC   insulin  aspart  0-5 Units Subcutaneous QHS   insulin  glargine-yfgn  20 Units Subcutaneous QHS   levothyroxine   88 mcg Oral Q0600   losartan   37.5 mg Oral Daily   melatonin  5 mg Oral QHS   pantoprazole   40 mg Oral Daily   QUEtiapine   12.5 mg Oral QHS   rOPINIRole   0.25 mg Oral QHS   sodium chloride  flush  3 mL Intravenous Q12H   umeclidinium bromide   1 puff Inhalation Daily   Continuous Infusions:  meropenem  (MERREM ) IV     PRN Meds:.acetaminophen  **OR** acetaminophen , ipratropium-albuterol , LORazepam , oxyCODONE , polyethylene glycol  Current Outpatient Medications  Medication Instructions   acetaminophen  (TYLENOL ) 1,000 mg, 3 times daily   albuterol  (VENTOLIN  HFA) 108 (90 Base) MCG/ACT inhaler 2 puffs, Inhalation, Every 6 hours PRN   apixaban  (ELIQUIS ) 5 MG TABS tablet Take 1 tablet (5mg ) twice daily   aspirin  81 mg, Every morning   atenolol  (TENORMIN ) 50 mg, Oral, 2 times daily   atorvastatin  (LIPITOR ) 80 mg, Oral, Daily   barrier cream (NON-SPECIFIED) CREA 1 Application, 2 times daily   chlorhexidine  (PERIDEX ) 0.12 % solution 15 mLs, Mouth/Throat, Daily   cyclobenzaprine  (FLEXERIL ) 10 mg, Oral, 3 times daily PRN   diclofenac  Sodium (VOLTAREN ) 2 g, Topical, 2 times daily   Emollient (EUCERIN) lotion 1 Application, As needed   EPINEPHrine (EPIPEN 2-PAK) 0.3 mg, Daily PRN   ezetimibe  (ZETIA ) 10 mg, Daily at bedtime   famotidine  (PEPCID ) 20 mg, Oral, 2 times daily   Fish Oil 1,000 mg, Daily at bedtime   FLUoxetine  (PROZAC ) 20 mg, Daily   fluticasone -salmeterol (ADVAIR) 100-50 MCG/ACT AEPB 1 puff, 2 times daily   gabapentin  (NEURONTIN ) 400 mg, Oral, 3 times daily   hydrALAZINE  (APRESOLINE ) 10 mg, Every 12 hours PRN   INCRUSE ELLIPTA  62.5 MCG/ACT AEPB 1 puff, Daily   insulin  lispro (HUMALOG ) 0-14 Units, See admin instructions    LACTOBACILLUS PO 1 capsule, Daily   Lantus  SoloStar 38 Units, Subcutaneous, Daily at bedtime   levothyroxine  (SYNTHROID ) 88 mcg, Every morning   LORazepam  (ATIVAN ) 0.5 MG tablet Take 0.5 mg by mouth 2 (two) times daily.   losartan  (COZAAR ) 37.5 mg, Daily   Macrobid  100 mg, Every morning   Melatonin 7.5 mg, Daily at bedtime   Menthol , Topical Analgesic, (BIOFREEZE COOL THE PAIN) 4 % GEL 1 Application, Topical, 2 times daily   Nutritional Supplements (BOOST GLUCOSE CONTROL PO) 240 mLs, Oral, 2 times daily   ondansetron  (ZOFRAN ) 4 mg, Every 8 hours PRN   OXYGEN  2 L/min, As needed   polyethylene glycol powder (GLYCOLAX /MIRALAX ) 17 g, Daily   QUEtiapine  (SEROQUEL ) 25 mg, Oral, Daily at bedtime   rOPINIRole  (REQUIP ) 0.25 mg,  Oral, Daily at bedtime   senna-docusate (SENOKOT-S) 8.6-50 MG tablet 1 tablet, Oral, 2 times daily   Trulicity  3 mg, Subcutaneous, Every 7 days   Vitamin D3 50,000 Units, Every Thu   witch hazel-glycerin (TUCKS) pad 1 Application, Every 4 hours PRN    DVT prophylaxis:  apixaban  (ELIQUIS ) tablet 5 mg   Code Status:   Code Status: Full Code  Family Communication: None  Disposition Plan: Back to her facility pending clinical improvement PT -   -   OT -   -    Level of care: Telemetry  Consultants:  None  Procedures:  None  Antimicrobials: None   Subjective: NAEO. States she is feeling better today than she did yesterday. No chills, not hurting all over. Urine cultures now positive for ESBL E. Coli. States she wants to stay another day to make sure she is 100% fine.    Objective: Vitals:   10/11/24 2001 10/12/24 0516 10/12/24 0842 10/12/24 1621  BP: 133/72 116/61 124/62 109/74  Pulse: 86 73 76 70  Resp: 17 17 18 18   Temp: 99.7 F (37.6 C) 98.6 F (37 C)  97.7 F (36.5 C)  TempSrc: Oral     SpO2: (!) 89% (!) 88% 90% (S) 90%  Weight:      Height:        Intake/Output Summary (Last 24 hours) at 10/12/2024 1737 Last data filed at 10/12/2024 0524 Gross  per 24 hour  Intake 200 ml  Output --  Net 200 ml   Filed Weights   10/09/24 0754  Weight: 85.7 kg    Examination:  Gen: NAD, A&Ox3 HEENT: NCAT, EOMI Neck: Supple, no JVD, no LAD CV: RRR, no murmurs Resp: normal WOB, CTAB, no w/r/r Abd: Soft, NTND, no guarding, BS normoactive Ext: No LE edema, pulses 2+ b/l Skin: Warm, dry Neuro: left upper extremity hemiplegia, otherwise no focal deficits Psych: Calm, cooperative, appropriate affect   Data Reviewed: I have personally reviewed following labs and imaging studies  CBC: Recent Labs  Lab 10/09/24 0800 10/10/24 0245 10/11/24 0417 10/12/24 0410  WBC 12.2* 10.2 8.0 8.5  HGB 14.0 13.0 12.3 12.0  HCT 44.5 41.1 38.3 37.6  MCV 86.6 87.8 86.7 85.5  PLT 250 232 210 185   Basic Metabolic Panel: Recent Labs  Lab 10/09/24 0800 10/10/24 0245 10/11/24 0417 10/12/24 0410  NA 134* 136 138 138  K 5.1 4.6 4.4 4.3  CL 95* 97* 100 99  CO2 26 31 30 30   GLUCOSE 224* 125* 132* 115*  BUN 29* 25* 20 16  CREATININE 1.31* 1.36* 1.17* 1.20*  CALCIUM  10.2 9.5 9.1 9.1   GFR: Estimated Creatinine Clearance: 62.3 mL/min (A) (by C-G formula based on SCr of 1.2 mg/dL (H)). Liver Function Tests: Recent Labs  Lab 10/09/24 1256 10/10/24 0245  AST 25 20  ALT 8 8  ALKPHOS 79 72  BILITOT 0.3 0.2  PROT 7.6 6.8  ALBUMIN 4.0 3.6   Recent Labs  Lab 10/09/24 1256  LIPASE 36   No results for input(s): AMMONIA in the last 168 hours. Coagulation Profile: No results for input(s): INR, PROTIME in the last 168 hours. Cardiac Enzymes: No results for input(s): CKTOTAL, CKMB, CKMBINDEX, TROPONINI in the last 168 hours. BNP (last 3 results) No results for input(s): PROBNP in the last 8760 hours. HbA1C: No results for input(s): HGBA1C in the last 72 hours. CBG: Recent Labs  Lab 10/11/24 1631 10/11/24 2003 10/12/24 0838 10/12/24 1116 10/12/24 1622  GLUCAP  114* 191* 126* 179* 144*   Lipid Profile: No results for  input(s): CHOL, HDL, LDLCALC, TRIG, CHOLHDL, LDLDIRECT in the last 72 hours. Thyroid Function Tests: No results for input(s): TSH, T4TOTAL, FREET4, T3FREE, THYROIDAB in the last 72 hours. Anemia Panel: No results for input(s): VITAMINB12, FOLATE, FERRITIN, TIBC, IRON, RETICCTPCT in the last 72 hours. Sepsis Labs: Recent Labs  Lab 10/09/24 1407 10/09/24 1503  LATICACIDVEN 2.1* 1.3    Recent Results (from the past 240 hours)  Resp panel by RT-PCR (RSV, Flu A&B, Covid) Anterior Nasal Swab     Status: None   Collection Time: 10/09/24 12:37 PM   Specimen: Anterior Nasal Swab  Result Value Ref Range Status   SARS Coronavirus 2 by RT PCR NEGATIVE NEGATIVE Final   Influenza A by PCR NEGATIVE NEGATIVE Final   Influenza B by PCR NEGATIVE NEGATIVE Final    Comment: (NOTE) The Xpert Xpress SARS-CoV-2/FLU/RSV plus assay is intended as an aid in the diagnosis of influenza from Nasopharyngeal swab specimens and should not be used as a sole basis for treatment. Nasal washings and aspirates are unacceptable for Xpert Xpress SARS-CoV-2/FLU/RSV testing.  Fact Sheet for Patients: bloggercourse.com  Fact Sheet for Healthcare Providers: seriousbroker.it  This test is not yet approved or cleared by the [place]  FDA and has been authorized for detection and/or diagnosis of SARS-CoV-2 by FDA under an Emergency Use Authorization (EUA). This EUA will remain in effect (meaning this test can be used) for the duration of the COVID-19 declaration under Section 564(b)(1) of the Act, 21 U.S.C. section 360bbb-3(b)(1), unless the authorization is terminated or revoked.     Resp Syncytial Virus by PCR NEGATIVE NEGATIVE Final    Comment: (NOTE) Fact Sheet for Patients: bloggercourse.com  Fact Sheet for Healthcare Providers: seriousbroker.it  This test is not yet  approved or cleared by the [place]  FDA and has been authorized for detection and/or diagnosis of SARS-CoV-2 by FDA under an Emergency Use Authorization (EUA). This EUA will remain in effect (meaning this test can be used) for the duration of the COVID-19 declaration under Section 564(b)(1) of the Act, 21 U.S.C. section 360bbb-3(b)(1), unless the authorization is terminated or revoked.  Performed at [facility], [address], [place], [place] [address]   Urine Culture     Status: Abnormal   Collection Time: 10/09/24 12:57 PM   Specimen: Urine, Clean Catch  Result Value Ref Range Status   Specimen Description URINE, CLEAN CATCH  Final   Special Requests   Final    NONE Performed at [facility], [address] [place], [place] [address]    Culture (A)  Final    >=100,000 COLONIES/mL ESCHERICHIA COLI Confirmed Extended Spectrum Beta-Lactamase Producer (ESBL).  In bloodstream infections from ESBL organisms, carbapenems are preferred over piperacillin/tazobactam. They are shown to have a lower risk of mortality.    Report Status 10/12/2024 FINAL  Final   Organism ID, Bacteria ESCHERICHIA COLI (A)  Final      Susceptibility   Escherichia coli - MIC*    AMPICILLIN >=32 RESISTANT Resistant     CEFAZOLIN (URINE) Value in next row Resistant      >=32 RESISTANTThis is a modified FDA-approved test that has been validated and its performance characteristics determined by the reporting laboratory.  This laboratory is certified under the Clinical Laboratory Improvement Amendments CLIA as qualified to perform high complexity clinical laboratory testing.    CEFEPIME  Value in next row Resistant      >=  32 RESISTANTThis is a modified FDA-approved test that has been validated and its performance characteristics determined by the reporting laboratory.  This laboratory is certified under the Clinical Laboratory Improvement Amendments CLIA as qualified to perform high complexity  clinical laboratory testing.    ERTAPENEM  Value in next row Sensitive      >=32 RESISTANTThis is a modified FDA-approved test that has been validated and its performance characteristics determined by the reporting laboratory.  This laboratory is certified under the Clinical Laboratory Improvement Amendments CLIA as qualified to perform high complexity clinical laboratory testing.    CEFTRIAXONE  Value in next row Resistant      >=32 RESISTANTThis is a modified FDA-approved test that has been validated and its performance characteristics determined by the reporting laboratory.  This laboratory is certified under the Clinical Laboratory Improvement Amendments CLIA as qualified to perform high complexity clinical laboratory testing.    CIPROFLOXACIN  Value in next row Resistant      >=32 RESISTANTThis is a modified FDA-approved test that has been validated and its performance characteristics determined by the reporting laboratory.  This laboratory is certified under the Clinical Laboratory Improvement Amendments CLIA as qualified to perform high complexity clinical laboratory testing.    GENTAMICIN Value in next row Sensitive      >=32 RESISTANTThis is a modified FDA-approved test that has been validated and its performance characteristics determined by the reporting laboratory.  This laboratory is certified under the Clinical Laboratory Improvement Amendments CLIA as qualified to perform high complexity clinical laboratory testing.    NITROFURANTOIN  Value in next row Intermediate      >=32 RESISTANTThis is a modified FDA-approved test that has been validated and its performance characteristics determined by the reporting laboratory.  This laboratory is certified under the Clinical Laboratory Improvement Amendments CLIA as qualified to perform high complexity clinical laboratory testing.    TRIMETH /SULFA  Value in next row Resistant      >=32 RESISTANTThis is a modified FDA-approved test that has been  validated and its performance characteristics determined by the reporting laboratory.  This laboratory is certified under the Clinical Laboratory Improvement Amendments CLIA as qualified to perform high complexity clinical laboratory testing.    AMPICILLIN/SULBACTAM Value in next row Intermediate      >=32 RESISTANTThis is a modified FDA-approved test that has been validated and its performance characteristics determined by the reporting laboratory.  This laboratory is certified under the Clinical Laboratory Improvement Amendments CLIA as qualified to perform high complexity clinical laboratory testing.    PIP/TAZO Value in next row Sensitive      <=4 SENSITIVEThis is a modified FDA-approved test that has been validated and its performance characteristics determined by the reporting laboratory.  This laboratory is certified under the Clinical Laboratory Improvement Amendments CLIA as qualified to perform high complexity clinical laboratory testing.    MEROPENEM  Value in next row Sensitive      <=4 SENSITIVEThis is a modified FDA-approved test that has been validated and its performance characteristics determined by the reporting laboratory.  This laboratory is certified under the Clinical Laboratory Improvement Amendments CLIA as qualified to perform high complexity clinical laboratory testing.    * >=100,000 COLONIES/mL ESCHERICHIA COLI  MRSA Next Gen by PCR, Nasal     Status: None   Collection Time: 10/10/24 11:05 PM   Specimen: Nasal Mucosa; Nasal Swab  Result Value Ref Range Status   MRSA by PCR Next Gen NOT DETECTED NOT DETECTED Final    Comment: (NOTE) The GeneXpert MRSA Assay (  FDA approved for NASAL specimens only), is one component of a comprehensive MRSA colonization surveillance program. It is not intended to diagnose MRSA infection nor to guide or monitor treatment for MRSA infections. Test performance is not FDA approved in patients less than 42 years old. Performed at [facility], [address], [place], [place] [address]      Radiology Studies: No results found.   Scheduled Meds:  apixaban   5 mg Oral BID   aspirin   81 mg Oral q AM   atenolol   50 mg Oral BID   atorvastatin   80 mg Oral QHS   doxycycline   100 mg Oral Q12H   ezetimibe   10 mg Oral QHS   FLUoxetine   20 mg Oral Daily   fluticasone  furoate-vilanterol  1 puff Inhalation Daily   gabapentin   400 mg Oral TID   insulin  aspart  0-15 Units Subcutaneous TID WC   insulin  aspart  0-5 Units Subcutaneous QHS   insulin  glargine-yfgn  20 Units Subcutaneous QHS   levothyroxine   88 mcg Oral Q0600   losartan   37.5 mg Oral Daily   melatonin  5 mg Oral QHS   pantoprazole   40 mg Oral Daily   QUEtiapine   12.5 mg Oral QHS   rOPINIRole   0.25 mg Oral QHS   sodium chloride  flush  3 mL Intravenous Q12H   umeclidinium bromide   1 puff Inhalation Daily   Continuous Infusions:  meropenem  (MERREM ) IV       Unresulted Labs (From admission, onward)     Start     Ordered   10/13/24 0500  Basic metabolic panel with GFR  Tomorrow morning,   R       Question:  Specimen collection method  Answer:  Lab=Lab collect   10/12/24 1909             LOS:  LOS: 0 days   Time Spent: 45 minutes  Shellee Streng Al-Sultani, MD [facility]  If 7PM-7AM, please contact night-coverage  10/12/2024, 5:37 PM      "

## 2024-10-12 NOTE — Plan of Care (Signed)
  Problem: Education: Goal: Ability to describe self-care measures that may prevent or decrease complications (Diabetes Survival Skills Education) will improve Outcome: Progressing Goal: Individualized Educational Video(s) Outcome: Progressing   Problem: Coping: Goal: Ability to adjust to condition or change in health will improve Outcome: Progressing   Problem: Fluid Volume: Goal: Ability to maintain a balanced intake and output will improve Outcome: Progressing   Problem: Health Behavior/Discharge Planning: Goal: Ability to identify and utilize available resources and services will improve Outcome: Progressing Goal: Ability to manage health-related needs will improve Outcome: Progressing   Problem: Metabolic: Goal: Ability to maintain appropriate glucose levels will improve Outcome: Progressing   Problem: Nutritional: Goal: Maintenance of adequate nutrition will improve Outcome: Progressing Goal: Progress toward achieving an optimal weight will improve Outcome: Progressing   Problem: Skin Integrity: Goal: Risk for impaired skin integrity will decrease Outcome: Progressing   Problem: Tissue Perfusion: Goal: Adequacy of tissue perfusion will improve Outcome: Progressing   Problem: Activity: Goal: Ability to tolerate increased activity will improve Outcome: Progressing   Problem: Clinical Measurements: Goal: Ability to maintain a body temperature in the normal range will improve Outcome: Progressing   Problem: Respiratory: Goal: Ability to maintain adequate ventilation will improve Outcome: Progressing Goal: Ability to maintain a clear airway will improve Outcome: Progressing

## 2024-10-13 DIAGNOSIS — J189 Pneumonia, unspecified organism: Secondary | ICD-10-CM | POA: Diagnosis not present

## 2024-10-13 DIAGNOSIS — J9611 Chronic respiratory failure with hypoxia: Secondary | ICD-10-CM

## 2024-10-13 DIAGNOSIS — E039 Hypothyroidism, unspecified: Secondary | ICD-10-CM | POA: Diagnosis not present

## 2024-10-13 DIAGNOSIS — K219 Gastro-esophageal reflux disease without esophagitis: Secondary | ICD-10-CM

## 2024-10-13 DIAGNOSIS — J4489 Other specified chronic obstructive pulmonary disease: Secondary | ICD-10-CM

## 2024-10-13 DIAGNOSIS — E1169 Type 2 diabetes mellitus with other specified complication: Secondary | ICD-10-CM | POA: Diagnosis not present

## 2024-10-13 DIAGNOSIS — E66811 Obesity, class 1: Secondary | ICD-10-CM | POA: Diagnosis not present

## 2024-10-13 DIAGNOSIS — Z8673 Personal history of transient ischemic attack (TIA), and cerebral infarction without residual deficits: Secondary | ICD-10-CM

## 2024-10-13 DIAGNOSIS — I8290 Acute embolism and thrombosis of unspecified vein: Secondary | ICD-10-CM | POA: Diagnosis not present

## 2024-10-13 DIAGNOSIS — J9621 Acute and chronic respiratory failure with hypoxia: Secondary | ICD-10-CM

## 2024-10-13 DIAGNOSIS — Z6832 Body mass index (BMI) 32.0-32.9, adult: Secondary | ICD-10-CM

## 2024-10-13 DIAGNOSIS — E1122 Type 2 diabetes mellitus with diabetic chronic kidney disease: Secondary | ICD-10-CM | POA: Diagnosis not present

## 2024-10-13 DIAGNOSIS — I5189 Other ill-defined heart diseases: Secondary | ICD-10-CM | POA: Diagnosis not present

## 2024-10-13 DIAGNOSIS — F419 Anxiety disorder, unspecified: Secondary | ICD-10-CM | POA: Diagnosis not present

## 2024-10-13 DIAGNOSIS — I10 Essential (primary) hypertension: Secondary | ICD-10-CM

## 2024-10-13 DIAGNOSIS — N1831 Chronic kidney disease, stage 3a: Secondary | ICD-10-CM | POA: Diagnosis not present

## 2024-10-13 DIAGNOSIS — F32A Depression, unspecified: Secondary | ICD-10-CM

## 2024-10-13 DIAGNOSIS — E6609 Other obesity due to excess calories: Secondary | ICD-10-CM

## 2024-10-13 DIAGNOSIS — E782 Mixed hyperlipidemia: Secondary | ICD-10-CM

## 2024-10-13 LAB — BASIC METABOLIC PANEL WITH GFR
Anion gap: 9 (ref 5–15)
BUN: 16 mg/dL (ref 6–20)
CO2: 30 mmol/L (ref 22–32)
Calcium: 9.5 mg/dL (ref 8.9–10.3)
Chloride: 97 mmol/L — ABNORMAL LOW (ref 98–111)
Creatinine, Ser: 1.14 mg/dL — ABNORMAL HIGH (ref 0.44–1.00)
GFR, Estimated: 58 mL/min — ABNORMAL LOW
Glucose, Bld: 112 mg/dL — ABNORMAL HIGH (ref 70–99)
Potassium: 4.1 mmol/L (ref 3.5–5.1)
Sodium: 136 mmol/L (ref 135–145)

## 2024-10-13 LAB — GLUCOSE, CAPILLARY
Glucose-Capillary: 131 mg/dL — ABNORMAL HIGH (ref 70–99)
Glucose-Capillary: 105 mg/dL — ABNORMAL HIGH (ref 70–99)
Glucose-Capillary: 135 mg/dL — ABNORMAL HIGH (ref 70–99)

## 2024-10-13 NOTE — Progress Notes (Signed)
 Pt ambulated in hallway. Pt dropped to 84% on room air. Pt returned to room and placed on 2L [place] and came back up to 91% at rest.

## 2024-10-13 NOTE — Progress Notes (Signed)
 " PROGRESS NOTE    Karla Hoover  FMW:[id number] DOB: 05/26/1971 DOA: 10/09/2024 PCP: Pcp, No   Brief Narrative:  Patient is a 54 year old female with PMHx significant for CVA with LUE hemiparesis, HFpEF, COPD, chronic hypoxic respiratory failure on 2 L PRN, PE/DVT on Eliquis , HTN, HLD, CKD stage IIIa, T2DM, hypothyroidism, anxiety, GERD, QTc prolongation.  Patient presents to our facility with worsening left-sided chest pain, pleuritic in nature worsening with deep inspiration.  Patient also denotes foul-smelling urine but denies any urgency hesitancy or dysuria.   CTA chest while negative for PE shows ground glass opacity left lower lobe as well as notable nodule, CT abdomen notable for small focus of gas in the urinary bladder.  Assessment & Plan:   Principal Problem:   CAP (community acquired pneumonia) Active Problems:   Type 2 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 disease, with long-term current use of insulin  (HCC)   Mixed 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 (HCC)   Chronic respiratory failure with hypoxia (HCC)   COPD with chronic bronchitis (HCC)   Essential hypertension   Hemiparesis affecting left side as late effect of cerebrovascular accident (CVA) (HCC)   hx of PE/DVT   Grade I diastolic dysfunction   CKD (chronic kidney disease) stage 3, GFR 30-59 ml/min (HCC)   GERD without esophagitis   Hypothyroidism   History of stroke   Class 1 obesity due to excess calories with body mass index (BMI) of 32.0 to 32.9 in adult   Acute on chronic respiratory failure with hypoxia (HCC)   Anxiety   Depression  Acute on chronic hypoxic respiratory failure Community-acquired pneumonia Intractable pleuritic chest pain, resolved -Patient currently on 2 L nasal cannula as needed at home typically with exertion -Notably desats to the 80s on room air at rest which is uncommon for her -Troponin flat at 25, CTA negative for PE - Left lower lobe  ground glass opacity concerning for pneumonia - Patient does not meet sepsis criteria - Continue doxycycline   ESBL E. coli UTI, POA -Transition to meropenem , last dose 10/14/2024 - Patient remains asymptomatic, urine no longer cloudy or foul per patient   COPD without acute exacerbation - Continue home Incruse. Advair transitioned to Breo - PRN DuoNebs q4h   Hypertension - Continue home atenolol , Losartan  Hyperlipidemia - Continue home atorvastatin , Zetia  Diabetes type 2, insulin -dependent  - Continue Lantus  20 units nightly(38u at home) - Continue sliding scale insulin , hypoglycemic protocol   CKD 3a, at baseline - Baseline creatinine around 1 -continue to follow Hypothyroidism - Continue home Synthroid  History of CVA with residual LUE hemiparesis  - Continue home aspirin , atorvastatin , Zetia  History of DVT and PE - Continue Eliquis  Chronic HFpEF, not in acute exacerbation - TTE 10/2023 and showed EF 55-60%, G1 DD, normal RV function. - Not currently on diuretic at baseline - Euvolemic on exam   Depression/Anxiety - Continue home fluoxetine  and Seroquel   DVT prophylaxis: apixaban  (ELIQUIS ) tablet 5 mg  Code Status:   Code Status: Full Code Family Communication: None present  Status is: Inpatient  Dispo: The patient is from: Home              Anticipated d/c is to: Home              Anticipated d/c date is: 24 to 48 hours              Patient currently not medically stable for discharge  Consultants:  None  Procedures:  None  Antimicrobials:  Doxycycline , meropenem   Subjective: No acute issues or events overnight denies nausea vomit diarrhea constipation headache fevers chills or chest pain  Objective: Vitals:   10/12/24 0842 10/12/24 1621 10/12/24 2046 10/13/24 0618  BP: 124/62 109/74 (!) 151/103 108/81  Pulse: 76 70 78 71  Resp: 18 18 18    Temp:  97.7 F (36.5 C) 97.8 F (36.6 C) 98.4 F (36.9 C)  TempSrc:   Oral   SpO2: 90% (S) 90% 90% 92%  Weight:       Height:        Intake/Output Summary (Last 24 hours) at 10/13/2024 0726 Last data filed at 10/13/2024 9375 Gross per 24 hour  Intake 220 ml  Output --  Net 220 ml   Filed Weights   10/09/24 0754  Weight: 85.7 kg    Examination:  General exam: Appears calm and comfortable  Respiratory system: Clear to auscultation. Respiratory effort normal. Cardiovascular system: S1 & S2 heard, RRR. No JVD, murmurs, rubs, gallops or clicks. No pedal edema. Gastrointestinal system: Abdomen is nondistended, soft and nontender. No organomegaly or masses felt. Normal bowel sounds heard. Central nervous system: Alert and oriented. No focal neurological deficits. Extremities: Symmetric 5 x 5 power. Skin: No rashes, lesions or ulcers Psychiatry: Judgement and insight appear normal. Mood & affect appropriate.     Data Reviewed: I have personally reviewed following labs and imaging studies  CBC: Recent Labs  Lab 10/09/24 0800 10/10/24 0245 10/11/24 0417 10/12/24 0410  WBC 12.2* 10.2 8.0 8.5  HGB 14.0 13.0 12.3 12.0  HCT 44.5 41.1 38.3 37.6  MCV 86.6 87.8 86.7 85.5  PLT 250 232 210 185   Basic Metabolic Panel: Recent Labs  Lab 10/09/24 0800 10/10/24 0245 10/11/24 0417 10/12/24 0410 10/13/24 0336  NA 134* 136 138 138 136  K 5.1 4.6 4.4 4.3 4.1  CL 95* 97* 100 99 97*  CO2 26 31 30 30 30   GLUCOSE 224* 125* 132* 115* 112*  BUN 29* 25* 20 16 16   CREATININE 1.31* 1.36* 1.17* 1.20* 1.14*  CALCIUM  10.2 9.5 9.1 9.1 9.5   GFR: Estimated Creatinine Clearance: 65.6 mL/min (A) (by C-G formula based on SCr of 1.14 mg/dL (H)). Liver Function Tests: Recent Labs  Lab 10/09/24 1256 10/10/24 0245  AST 25 20  ALT 8 8  ALKPHOS 79 72  BILITOT 0.3 0.2  PROT 7.6 6.8  ALBUMIN 4.0 3.6   Recent Labs  Lab 10/09/24 1256  LIPASE 36   No results for input(s): AMMONIA in the last 168 hours. Coagulation Profile: No results for input(s): INR, PROTIME in the last 168 hours. Cardiac  Enzymes: No results for input(s): CKTOTAL, CKMB, CKMBINDEX, TROPONINI in the last 168 hours. BNP (last 3 results) No results for input(s): PROBNP in the last 8760 hours. HbA1C: No results for input(s): HGBA1C in the last 72 hours. CBG: Recent Labs  Lab 10/11/24 2003 10/12/24 0838 10/12/24 1116 10/12/24 1622 10/12/24 2011  GLUCAP 191* 126* 179* 144* 110*   Lipid Profile: No results for input(s): CHOL, HDL, LDLCALC, TRIG, CHOLHDL, LDLDIRECT in the last 72 hours. Thyroid Function Tests: No results for input(s): TSH, T4TOTAL, FREET4, T3FREE, THYROIDAB in the last 72 hours. Anemia Panel: No results for input(s): VITAMINB12, FOLATE, FERRITIN, TIBC, IRON, RETICCTPCT in the last 72 hours. Sepsis Labs: Recent Labs  Lab 10/09/24 1407 10/09/24 1503  LATICACIDVEN 2.1* 1.3    Recent Results (from the past 240 hours)  Resp panel by RT-PCR (RSV, Flu A&B,  Covid) Anterior Nasal Swab     Status: None   Collection Time: 10/09/24 12:37 PM   Specimen: Anterior Nasal Swab  Result Value Ref Range Status   SARS Coronavirus 2 by RT PCR NEGATIVE NEGATIVE Final   Influenza A by PCR NEGATIVE NEGATIVE Final   Influenza B by PCR NEGATIVE NEGATIVE Final    Comment: (NOTE) The Xpert Xpress SARS-CoV-2/FLU/RSV plus assay is intended as an aid in the diagnosis of influenza from Nasopharyngeal swab specimens and should not be used as a sole basis for treatment. Nasal washings and aspirates are unacceptable for Xpert Xpress SARS-CoV-2/FLU/RSV testing.  Fact Sheet for Patients: bloggercourse.com  Fact Sheet for Healthcare Providers: seriousbroker.it  This test is not yet approved or cleared by the [place]  FDA and has been authorized for detection and/or diagnosis of SARS-CoV-2 by FDA under an Emergency Use Authorization (EUA). This EUA will remain in effect (meaning this test can be used) for the  duration of the COVID-19 declaration under Section 564(b)(1) of the Act, 21 U.S.C. section 360bbb-3(b)(1), unless the authorization is terminated or revoked.     Resp Syncytial Virus by PCR NEGATIVE NEGATIVE Final    Comment: (NOTE) Fact Sheet for Patients: bloggercourse.com  Fact Sheet for Healthcare Providers: seriousbroker.it  This test is not yet approved or cleared by the [place]  FDA and has been authorized for detection and/or diagnosis of SARS-CoV-2 by FDA under an Emergency Use Authorization (EUA). This EUA will remain in effect (meaning this test can be used) for the duration of the COVID-19 declaration under Section 564(b)(1) of the Act, 21 U.S.C. section 360bbb-3(b)(1), unless the authorization is terminated or revoked.  Performed at [facility], [address] [place], [place] [address]   Urine Culture     Status: Abnormal   Collection Time: 10/09/24 12:57 PM   Specimen: Urine, Clean Catch  Result Value Ref Range Status   Specimen Description URINE, CLEAN CATCH  Final   Special Requests   Final    NONE Performed at [facility], [address], [place], [place] [address]    Culture (A)  Final    >=100,000 COLONIES/mL ESCHERICHIA COLI Confirmed Extended Spectrum Beta-Lactamase Producer (ESBL).  In bloodstream infections from ESBL organisms, carbapenems are preferred over piperacillin/tazobactam. They are shown to have a lower risk of mortality.    Report Status 10/12/2024 FINAL  Final   Organism ID, Bacteria ESCHERICHIA COLI (A)  Final      Susceptibility   Escherichia coli - MIC*    AMPICILLIN >=32 RESISTANT Resistant     CEFAZOLIN (URINE) Value in next row Resistant      >=32 RESISTANTThis is a modified FDA-approved test that has been validated and its performance characteristics determined by the reporting laboratory.  This laboratory is certified under the Clinical Laboratory Improvement  Amendments CLIA as qualified to perform high complexity clinical laboratory testing.    CEFEPIME  Value in next row Resistant      >=32 RESISTANTThis is a modified FDA-approved test that has been validated and its performance characteristics determined by the reporting laboratory.  This laboratory is certified under the Clinical Laboratory Improvement Amendments CLIA as qualified to perform high complexity clinical laboratory testing.    ERTAPENEM  Value in next row Sensitive      >=32 RESISTANTThis is a modified FDA-approved test that has been validated and its performance characteristics determined by the reporting laboratory.  This laboratory is certified under the Clinical Laboratory Improvement Amendments CLIA as  qualified to perform high complexity clinical laboratory testing.    CEFTRIAXONE  Value in next row Resistant      >=32 RESISTANTThis is a modified FDA-approved test that has been validated and its performance characteristics determined by the reporting laboratory.  This laboratory is certified under the Clinical Laboratory Improvement Amendments CLIA as qualified to perform high complexity clinical laboratory testing.    CIPROFLOXACIN  Value in next row Resistant      >=32 RESISTANTThis is a modified FDA-approved test that has been validated and its performance characteristics determined by the reporting laboratory.  This laboratory is certified under the Clinical Laboratory Improvement Amendments CLIA as qualified to perform high complexity clinical laboratory testing.    GENTAMICIN Value in next row Sensitive      >=32 RESISTANTThis is a modified FDA-approved test that has been validated and its performance characteristics determined by the reporting laboratory.  This laboratory is certified under the Clinical Laboratory Improvement Amendments CLIA as qualified to perform high complexity clinical laboratory testing.    NITROFURANTOIN  Value in next row Intermediate      >=32 RESISTANTThis is  a modified FDA-approved test that has been validated and its performance characteristics determined by the reporting laboratory.  This laboratory is certified under the Clinical Laboratory Improvement Amendments CLIA as qualified to perform high complexity clinical laboratory testing.    TRIMETH /SULFA  Value in next row Resistant      >=32 RESISTANTThis is a modified FDA-approved test that has been validated and its performance characteristics determined by the reporting laboratory.  This laboratory is certified under the Clinical Laboratory Improvement Amendments CLIA as qualified to perform high complexity clinical laboratory testing.    AMPICILLIN/SULBACTAM Value in next row Intermediate      >=32 RESISTANTThis is a modified FDA-approved test that has been validated and its performance characteristics determined by the reporting laboratory.  This laboratory is certified under the Clinical Laboratory Improvement Amendments CLIA as qualified to perform high complexity clinical laboratory testing.    PIP/TAZO Value in next row Sensitive      <=4 SENSITIVEThis is a modified FDA-approved test that has been validated and its performance characteristics determined by the reporting laboratory.  This laboratory is certified under the Clinical Laboratory Improvement Amendments CLIA as qualified to perform high complexity clinical laboratory testing.    MEROPENEM  Value in next row Sensitive      <=4 SENSITIVEThis is a modified FDA-approved test that has been validated and its performance characteristics determined by the reporting laboratory.  This laboratory is certified under the Clinical Laboratory Improvement Amendments CLIA as qualified to perform high complexity clinical laboratory testing.    * >=100,000 COLONIES/mL ESCHERICHIA COLI  MRSA Next Gen by PCR, Nasal     Status: None   Collection Time: 10/10/24 11:05 PM   Specimen: Nasal Mucosa; Nasal Swab  Result Value Ref Range Status   MRSA by PCR Next Gen  NOT DETECTED NOT DETECTED Final    Comment: (NOTE) The GeneXpert MRSA Assay (FDA approved for NASAL specimens only), is one component of a comprehensive MRSA colonization surveillance program. It is not intended to diagnose MRSA infection nor to guide or monitor treatment for MRSA infections. Test performance is not FDA approved in patients less than 71 years old. Performed at [facility], [address] [place], [place] [address]          Radiology Studies: No results found.      Scheduled Meds:  apixaban   5 mg Oral BID  aspirin   81 mg Oral q AM   atenolol   50 mg Oral BID   atorvastatin   80 mg Oral QHS   doxycycline   100 mg Oral Q12H   ezetimibe   10 mg Oral QHS   FLUoxetine   20 mg Oral Daily   fluticasone  furoate-vilanterol  1 puff Inhalation Daily   gabapentin   400 mg Oral TID   insulin  aspart  0-15 Units Subcutaneous TID WC   insulin  aspart  0-5 Units Subcutaneous QHS   insulin  glargine-yfgn  20 Units Subcutaneous QHS   levothyroxine   88 mcg Oral Q0600   losartan   37.5 mg Oral Daily   melatonin  5 mg Oral QHS   pantoprazole   40 mg Oral Daily   QUEtiapine   12.5 mg Oral QHS   rOPINIRole   0.25 mg Oral QHS   sodium chloride  flush  3 mL Intravenous Q12H   umeclidinium bromide   1 puff Inhalation Daily   Continuous Infusions:  meropenem  (MERREM ) IV 1 g (10/13/24 0624)     LOS: 0 days   Time spent:  Elsie JAYSON Montclair, DO [facility]  If 7PM-7AM, please contact night-coverage [email or web address]  10/13/2024, 7:26 AM      "

## 2024-10-13 NOTE — Plan of Care (Signed)
  Problem: Education: Goal: Ability to describe self-care measures that may prevent or decrease complications (Diabetes Survival Skills Education) will improve Outcome: Progressing Goal: Individualized Educational Video(s) Outcome: Progressing   Problem: Coping: Goal: Ability to adjust to condition or change in health will improve Outcome: Progressing   Problem: Fluid Volume: Goal: Ability to maintain a balanced intake and output will improve Outcome: Progressing   Problem: Health Behavior/Discharge Planning: Goal: Ability to identify and utilize available resources and services will improve Outcome: Progressing Goal: Ability to manage health-related needs will improve Outcome: Progressing   Problem: Metabolic: Goal: Ability to maintain appropriate glucose levels will improve Outcome: Progressing   Problem: Nutritional: Goal: Maintenance of adequate nutrition will improve Outcome: Progressing Goal: Progress toward achieving an optimal weight will improve Outcome: Progressing   Problem: Skin Integrity: Goal: Risk for impaired skin integrity will decrease Outcome: Progressing   Problem: Tissue Perfusion: Goal: Adequacy of tissue perfusion will improve Outcome: Progressing   Problem: Activity: Goal: Ability to tolerate increased activity will improve Outcome: Progressing   Problem: Clinical Measurements: Goal: Ability to maintain a body temperature in the normal range will improve Outcome: Progressing   Problem: Respiratory: Goal: Ability to maintain adequate ventilation will improve Outcome: Progressing Goal: Ability to maintain a clear airway will improve Outcome: Progressing

## 2024-10-13 NOTE — TOC Progression Note (Signed)
 Transition of Care [facility]) - Progression Note    Patient Details  Name: Karla Hoover MRN: [id number] Date of Birth: [birth year]  [facility] CM/SW Contact  Lendia Dais, LCSWA Phone Number: 10/13/2024, 2:22 PM  Clinical Narrative:  Per MD pt is not yet medically stable. EDD is 24-48 hours. Pt will return to [facility].  CSW will continue follow.    Expected Discharge Plan: Skilled Nursing Facility Barriers to Discharge: Continued Medical Work up               Expected Discharge Plan and Services In-house Referral: Clinical Social Work     Living arrangements for the past 2 months: Skilled Nursing Facility                                       Social Drivers of Health (SDOH) Interventions SDOH Screenings   Food Insecurity: No Food Insecurity (10/10/2024)  Housing: Low Risk (10/10/2024)  Transportation Needs: No Transportation Needs (10/10/2024)  Utilities: Not At Risk (10/10/2024)  Social Connections: Not on File (06/28/2023)   Received from [facility]  Tobacco Use: Medium Risk (10/09/2024)    Readmission Risk Interventions    09/10/2023   10:01 AM 01/21/2023   12:08 PM 12/18/2022   11:10 AM  Readmission Risk Prevention Plan  Transportation Screening Complete Complete Complete  PCP or Specialist Appt within 5-7 Days  Complete   PCP or Specialist Appt within 3-5 Days Complete  Complete  Home Care Screening  Complete   Medication Review (RN CM)  Complete   HRI or Home Care Consult Complete  Complete  Social Work Consult for Recovery Care Planning/Counseling Complete  Complete  Palliative Care Screening Not Applicable  Not Applicable  Medication Review Oceanographer) Complete  Complete

## 2024-10-14 DIAGNOSIS — J189 Pneumonia, unspecified organism: Secondary | ICD-10-CM | POA: Diagnosis not present

## 2024-10-14 DIAGNOSIS — E1169 Type 2 diabetes mellitus with other specified complication: Secondary | ICD-10-CM | POA: Diagnosis not present

## 2024-10-14 DIAGNOSIS — E1122 Type 2 diabetes mellitus with diabetic chronic kidney disease: Secondary | ICD-10-CM | POA: Diagnosis not present

## 2024-10-14 DIAGNOSIS — J9611 Chronic respiratory failure with hypoxia: Secondary | ICD-10-CM | POA: Diagnosis not present

## 2024-10-14 LAB — GLUCOSE, CAPILLARY
Glucose-Capillary: 165 mg/dL — ABNORMAL HIGH (ref 70–99)
Glucose-Capillary: 133 mg/dL — ABNORMAL HIGH (ref 70–99)
Glucose-Capillary: 155 mg/dL — ABNORMAL HIGH (ref 70–99)

## 2024-10-14 MED ORDER — OMEPRAZOLE 20 MG PO CPDR: 20.0000 mg | DELAYED_RELEASE_CAPSULE | Freq: Every morning | ORAL | 0 refills | Status: AC

## 2024-10-14 MED ORDER — ORAL CARE MOUTH RINSE: 15.0000 mL | OROMUCOSAL | Status: DC | PRN

## 2024-10-14 NOTE — Plan of Care (Signed)
" °  Problem: Education: Goal: Individualized Educational Video(s) Outcome: Adequate for Discharge   Problem: Coping: Goal: Ability to adjust to condition or change in health will improve Outcome: Adequate for Discharge   Problem: Fluid Volume: Goal: Ability to maintain a balanced intake and output will improve Outcome: Adequate for Discharge   Problem: Health Behavior/Discharge Planning: Goal: Ability to identify and utilize available resources and services will improve Outcome: Adequate for Discharge Goal: Ability to manage health-related needs will improve Outcome: Adequate for Discharge   Problem: Metabolic: Goal: Ability to maintain appropriate glucose levels will improve Outcome: Adequate for Discharge   Problem: Nutritional: Goal: Maintenance of adequate nutrition will improve Outcome: Adequate for Discharge Goal: Progress toward achieving an optimal weight will improve Outcome: Adequate for Discharge   Problem: Skin Integrity: Goal: Risk for impaired skin integrity will decrease Outcome: Adequate for Discharge   Problem: Tissue Perfusion: Goal: Adequacy of tissue perfusion will improve Outcome: Adequate for Discharge   Problem: Activity: Goal: Ability to tolerate increased activity will improve Outcome: Adequate for Discharge   Problem: Clinical Measurements: Goal: Ability to maintain a body temperature in the normal range will improve Outcome: Adequate for Discharge   Problem: Respiratory: Goal: Ability to maintain adequate ventilation will improve Outcome: Adequate for Discharge Goal: Ability to maintain a clear airway will improve Outcome: Adequate for Discharge   "

## 2024-10-14 NOTE — [place] FL2 (Signed)
 " [place]  MEDICAID FL2 LEVEL OF CARE FORM     IDENTIFICATION  Patient Name: Karla Hoover Birthdate: December 03, 1970 Sex: female Admission Date (Current Location): 10/09/2024  [facility] and [place] Number:  Producer, Television/film/video and Address:  [facility] [place]. [facility], [address] [place], [place] [address]      Provider Number: [id number]  Attending Physician Name and Address:  Lue Elsie JAYSON, MD  Relative Name and Phone Number:  [facility]  Mother, Emergency Contact  [phone]    Current Level of Care: Hospital Recommended Level of Care: Skilled Nursing Facility Prior Approval Number:    Date Approved/Denied:   PASRR Number: [id number]  Discharge Plan: SNF    Current Diagnoses: Patient Active Problem List   Diagnosis Date Noted   CAP (community acquired pneumonia) 10/09/2024   Aggressive behavior 10/17/2023   Prolonged QT interval 10/17/2023   UTI due to extended-spectrum beta lactamase (ESBL) producing Escherichia coli 09/06/2023   Acute on chronic respiratory failure with hypoxia (HCC) 09/06/2023   Hypotension 09/06/2023   Leukocytosis 01/21/2023   Class 1 obesity due to excess calories with body mass index (BMI) of 32.0 to 32.9 in adult 12/18/2022   Dyslipidemia 12/18/2022   Hyponatremia 12/13/2022   CKD (chronic kidney disease) stage 3, GFR 30-59 ml/min (HCC) 12/13/2022   Hemiparesis affecting left side as late effect of cerebrovascular accident (CVA) (HCC) 08/30/2022   Allergies    Grade I diastolic dysfunction 12/02/2021   Mild protein-calorie malnutrition 12/02/2021   Chronic respiratory failure with hypoxia (HCC) 02/09/2021   Type 2 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 disease, with long-term current use of insulin  (HCC) 02/09/2021   Mixed 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 (HCC) 02/09/2021   GERD without esophagitis 02/09/2021   Hypothyroidism 02/09/2021   Lymphadenopathy 12/28/2020   hx  of PE/DVT 12/01/2019   Overweight (BMI 25.0-29.9) 11/22/2019   Tobacco abuse 11/16/2019   History of stroke 11/15/2019   Depression 02/15/2018   Anxiety 01/24/2017   Essential hypertension 05/04/2009   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    Orientation RESPIRATION BLADDER Height & Weight     Self, Time, Situation, Place  Normal Continent Weight: 189 lb (85.7 kg) Height:  5' 7.5 (171.5 cm)  BEHAVIORAL SYMPTOMS/MOOD NEUROLOGICAL BOWEL NUTRITION STATUS      Continent Diet (see dc summary)  AMBULATORY STATUS COMMUNICATION OF NEEDS Skin     Verbally Other (Comment) (rash)                       Personal Care Assistance Level of Assistance              Functional Limitations Info             SPECIAL CARE FACTORS FREQUENCY  PT (By licensed PT), OT (By licensed OT)     PT Frequency: 5x/wk OT Frequency: 5x/wk            Contractures Contractures Info: Not present    Additional Factors Info  Code Status, Allergies Code Status Info: FULL Allergies Info: Cipro  (Ciprofloxacin  Hcl)  Guaifenesin   Kiwi Extract  Levaquin (Levofloxacin)  Strawberry Extract  Lioresal (Baclofen)  Vancomycin            Current Medications (10/14/2024):  This is the current hospital active medication list Current Facility-Administered Medications  Medication Dose Route Frequency Provider Last Rate Last Admin   acetaminophen  (TYLENOL ) tablet 650 mg  650 mg Oral Q6H  PRN Melvin, Alexander B, MD   650 mg at 10/13/24 1003   Or   acetaminophen  (TYLENOL ) suppository 650 mg  650 mg Rectal Q6H PRN Seena Marsa NOVAK, MD       apixaban  (ELIQUIS ) tablet 5 mg  5 mg Oral BID Melvin, Alexander B, MD   5 mg at 10/14/24 9180   aspirin  chewable tablet 81 mg  81 mg Oral q AM Melvin, Alexander B, MD   81 mg at 10/14/24 9180   atenolol  (TENORMIN ) tablet 50 mg  50 mg Oral BID Melvin, Alexander B, MD   50 mg at 10/14/24 9180   atorvastatin  (LIPITOR ) tablet 80 mg  80 mg Oral QHS Melvin, Alexander B,  MD   80 mg at 10/13/24 2137   ezetimibe  (ZETIA ) tablet 10 mg  10 mg Oral QHS Melvin, Alexander B, MD   10 mg at 10/13/24 2137   FLUoxetine  (PROZAC ) capsule 20 mg  20 mg Oral Daily Melvin, Alexander B, MD   20 mg at 10/14/24 9180   fluticasone  furoate-vilanterol (BREO ELLIPTA ) 100-25 MCG/ACT 1 puff  1 puff Inhalation Daily Melvin, Alexander B, MD       gabapentin  (NEURONTIN ) capsule 400 mg  400 mg Oral TID Melvin, Alexander B, MD   400 mg at 10/14/24 9180   insulin  aspart (novoLOG ) injection 0-15 Units  0-15 Units Subcutaneous TID WC Melvin, Alexander B, MD   2 Units at 10/14/24 0818   insulin  aspart (novoLOG ) injection 0-5 Units  0-5 Units Subcutaneous QHS Melvin, Alexander B, MD   2 Units at 10/10/24 2234   insulin  glargine-yfgn (SEMGLEE ) injection 20 Units  20 Units Subcutaneous QHS Al-Sultani, Anmar, MD   20 Units at 10/13/24 2137   ipratropium-albuterol  (DUONEB) 0.5-2.5 (3) MG/3ML nebulizer solution 3 mL  3 mL Nebulization Q4H PRN Al-Sultani, Anmar, MD       levothyroxine  (SYNTHROID ) tablet 88 mcg  88 mcg Oral Q0600 Melvin, Alexander B, MD   88 mcg at 10/14/24 9367   LORazepam  (ATIVAN ) tablet 0.5 mg  0.5 mg Oral BID PRN Melvin, Alexander B, MD       losartan  (COZAAR ) tablet 37.5 mg  37.5 mg Oral Daily Melvin, Alexander B, MD   37.5 mg at 10/14/24 9180   melatonin tablet 5 mg  5 mg Oral QHS Melvin, Alexander B, MD   5 mg at 10/13/24 2137   Oral care mouth rinse  15 mL Mouth Rinse PRN Lue Elsie BROCKS, MD       pantoprazole  (PROTONIX ) EC tablet 40 mg  40 mg Oral Daily Melvin, Alexander B, MD   40 mg at 10/14/24 9180   polyethylene glycol (MIRALAX  / GLYCOLAX ) packet 17 g  17 g Oral Daily PRN Melvin, Alexander B, MD       QUEtiapine  (SEROQUEL ) tablet 12.5 mg  12.5 mg Oral QHS Melvin, Alexander B, MD   12.5 mg at 10/13/24 2137   rOPINIRole  (REQUIP ) tablet 0.25 mg  0.25 mg Oral QHS Melvin, Alexander B, MD   0.25 mg at 10/13/24 2137   sodium chloride  flush (NS) 0.9 % injection 3 mL  3 mL  Intravenous Q12H Seena Marsa NOVAK, MD   3 mL at 10/14/24 9178   umeclidinium bromide  (INCRUSE ELLIPTA ) 62.5 MCG/ACT 1 puff  1 puff Inhalation Daily Melvin, Alexander B, MD         Discharge Medications: Please see discharge summary for a list of discharge medications.  Relevant Imaging Results:  Relevant Lab Results:   Additional Information  SSN: [id number] LCSWA     "

## 2024-10-14 NOTE — TOC Transition Note (Signed)
 Transition of Care [facility]) - Discharge Note   Patient Details  Name: Karla Hoover MRN: [id number] Date of Birth: 10-14-1970  [facility] CM/SW Contact:  Lendia Dais, LCSWA Phone Number: 10/14/2024, 12:32 PM   Clinical Narrative:  Pt is discharging and return to [facility]. RN report to [phone].  CSW called and spoke to pt's mother who stated they can provide transportation and are here now.  No further TOC needs.     Final next level of care: Skilled Nursing Facility Barriers to Discharge: Barriers Resolved   Patient Goals and CMS Choice Patient states their goals for this hospitalization and ongoing recovery are:: [employer]   Choice offered to / list presented to : NA      Discharge Placement              Patient [facility] bed at: [facility] Patient to be transferred to facility by: Mother Name of family member notified: Mother Patient and family notified of of transfer: 10/14/24  Discharge Plan and Services Additional resources added to the After Visit Summary for   In-house Referral: Clinical Social Work                                   Social Drivers of Health (SDOH) Interventions SDOH Screenings   Food Insecurity: No Food Insecurity (10/10/2024)  Housing: Low Risk (10/10/2024)  Transportation Needs: No Transportation Needs (10/10/2024)  Utilities: Not At Risk (10/10/2024)  Social Connections: Not on File (06/28/2023)   Received from [facility]  Tobacco Use: Medium Risk (10/09/2024)     Readmission Risk Interventions    09/10/2023   10:01 AM 01/21/2023   12:08 PM 12/18/2022   11:10 AM  Readmission Risk Prevention Plan  Transportation Screening Complete Complete Complete  PCP or Specialist Appt within 5-7 Days  Complete   PCP or Specialist Appt within 3-5 Days Complete  Complete  Home Care Screening  Complete   Medication Review (RN CM)  Complete   HRI or Home Care Consult Complete  Complete  Social Work Consult for  Recovery Care Planning/Counseling Complete  Complete  Palliative Care Screening Not Applicable  Not Applicable  Medication Review Oceanographer) Complete  Complete

## 2024-10-14 NOTE — Discharge Summary (Signed)
 Physician Discharge Summary  Karla Hoover FMW:[id number] DOB: 29-Aug-1971 DOA: 10/09/2024  PCP: Pcp, No  Admit date: 10/09/2024 Discharge date: 10/14/2024  Admitted From: Facility Disposition:  Same  Recommendations for Outpatient Follow-up:  Follow up with PCP in 1-2 weeks  Discharge Condition:Stable  CODE STATUS:Full  Diet recommendation: Diabetic diet    Brief/Interim Summary: Patient is a pleasant patient is a 54 year old female with PMHx significant for CVA with LUE hemiparesis, HFpEF, COPD, chronic hypoxic respiratory failure on 2 L PRN, PE/DVT on Eliquis , HTN, HLD, CKD stage IIIa, T2DM, hypothyroidism, anxiety, GERD, QTc prolongation.   Patient presents to our facility with worsening left-sided chest pain, pleuritic in nature worsening with deep inspiration.  Patient also denotes foul-smelling urine but denies any urgency hesitancy or dysuria.  CTA negative for PE notable for left lower lobe opacity as well as nodularity -patient treated for pneumonia as below with marked improvement.  Patient's urine continues to be positive for ESBL E. coli, likely colonized per discussion with infectious disease, completed 2 days of meropenem  but given lack of symptoms we will discontinue antibiotics and follow clinically.  She is at this time otherwise stable and agreeable for discharge back to facility.  Mother at bedside agrees.  Lengthy discussion in regards to compliance with medications and diet given her moderate improvement with insulin  dosing while here on improved diet.    Discharge Diagnoses:  Principal Problem:   CAP (community acquired pneumonia) Active Problems:   Type 2 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 disease, with long-term current use of insulin  (HCC)   Mixed 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 (HCC)   Chronic respiratory failure with hypoxia (HCC)   COPD with chronic bronchitis (HCC)   Essential hypertension   Hemiparesis affecting  left side as late effect of cerebrovascular accident (CVA) (HCC)   hx of PE/DVT   Grade I diastolic dysfunction   CKD (chronic kidney disease) stage 3, GFR 30-59 ml/min (HCC)   GERD without esophagitis   Hypothyroidism   History of stroke   Class 1 obesity due to excess calories with body mass index (BMI) of 32.0 to 32.9 in adult   Acute on chronic respiratory failure with hypoxia (HCC)   Anxiety   Depression  Acute on chronic hypoxic respiratory failure, resolved Community-acquired pneumonia, resolved Intractable pleuritic chest pain, resolved -Patient currently on 2 L nasal cannula as needed at baseline, typically with exertion - Initially desatting into the 80s on room air even at rest  -Troponin flat at 25, CTA negative for PE - Left lower lobe ground glass opacity concerning for pneumonia -now completed antibiotic course - Patient does not meet sepsis criteria   ESBL E. coli positive urine, UTI ruled out, POA - Discussed case with infectious disease, no further antibiotics are indicated given her colonized state and lack of symptoms - Patient remains asymptomatic, urine no longer cloudy or foul per patient   COPD without acute exacerbation - Continue home Incruse. Advair transitioned to Breo - PRN DuoNebs q4h   Hypertension - Continue home atenolol , Losartan  Hyperlipidemia - Continue home atorvastatin , Zetia  Diabetes type 2, insulin -dependent - Resume home insulin  dosing given poor dietary restrictions/compliance CKD 3a, at baseline - Baseline creatinine around 1 -continue to follow Hypothyroidism - Continue home Synthroid  History of CVA with residual LUE hemiparesis  - Continue home aspirin , atorvastatin , Zetia  History of DVT and PE - Continue Eliquis  Chronic HFpEF, not in acute exacerbation - TTE 10/2023 and showed EF 55-60%, G1 DD,  normal RV function. - Not currently on diuretic at baseline - Euvolemic on exam Depression/Anxiety - Continue home fluoxetine  and  Seroquel   Discharge Instructions  Discharge Instructions     Call MD for:  difficulty breathing, headache or visual disturbances   Complete by: As directed    Call MD for:  extreme fatigue   Complete by: As directed    Call MD for:  hives   Complete by: As directed    Call MD for:  persistant dizziness or light-headedness   Complete by: As directed    Call MD for:  persistant nausea and vomiting   Complete by: As directed    Call MD for:  severe uncontrolled pain   Complete by: As directed    Call MD for:  temperature >100.4   Complete by: As directed    Diet Carb Modified   Complete by: As directed    Increase activity slowly   Complete by: As directed       Allergies as of 10/14/2024       Reactions   Cipro  [ciprofloxacin  Hcl] Hives   Guaifenesin  Anaphylaxis   Kiwi Extract Anaphylaxis, Rash   Levaquin [levofloxacin] Shortness Of Breath, Rash   Strawberry Extract Anaphylaxis, Rash   Lioresal [baclofen] Other (See Comments)   Near syncope/ fall   Vancomycin  Itching        Medication List     STOP taking these medications    hydrALAZINE  10 MG tablet Commonly known as: APRESOLINE    Macrobid  100 MG capsule Generic drug: nitrofurantoin  (macrocrystal-monohydrate)       TAKE these medications    rOPINIRole  0.25 MG tablet Commonly known as: REQUIP  Take 1 tablet (0.25 mg total) by mouth at bedtime. The timing of this medication is very important.   acetaminophen  500 MG tablet Commonly known as: TYLENOL  Take 1,000 mg by mouth 3 (three) times daily.   albuterol  108 (90 Base) MCG/ACT inhaler Commonly known as: VENTOLIN  HFA Inhale 2 puffs into the lungs every 6 (six) hours as needed for wheezing or shortness of breath.   apixaban  5 MG Tabs tablet Commonly known as: Eliquis  Take 1 tablet (5mg ) twice daily   aspirin  81 MG chewable tablet Chew 81 mg by mouth in the morning.   atenolol  50 MG tablet Commonly known as: TENORMIN  Take 50 mg by mouth 2 (two)  times daily.   atorvastatin  80 MG tablet Commonly known as: LIPITOR  Take 1 tablet (80 mg total) by mouth daily.   barrier cream Crea Commonly known as: non-specified Apply 1 Application topically in the morning and at bedtime.   Biofreeze Cool The Pain 4 % Gel Generic drug: Menthol  (Topical Analgesic) Apply 1 Application topically in the morning and at bedtime.   BOOST GLUCOSE CONTROL PO Take 240 mLs by mouth in the morning and at bedtime.   chlorhexidine  0.12 % solution Commonly known as: PERIDEX  Use as directed 15 mLs in the mouth or throat daily.   cyclobenzaprine  10 MG tablet Commonly known as: FLEXERIL  Take 1 tablet (10 mg total) by mouth 3 (three) times daily as needed for muscle spasms.   diclofenac  Sodium 1 % Gel Commonly known as: VOLTAREN  Apply 2 g topically in the morning and at bedtime.   EpiPen 2-Pak 0.3 mg/0.3 mL Soaj injection Generic drug: EPINEPHrine Inject 0.3 mg into the muscle daily as needed for anaphylaxis.   eucerin lotion Apply 1 Application topically as needed for dry skin.   ezetimibe  10 MG tablet Commonly known as:  ZETIA  Take 10 mg by mouth at bedtime.   famotidine  20 MG tablet Commonly known as: PEPCID  Take 20 mg by mouth 2 (two) times daily.   Fish Oil 1000 MG Caps Take 1,000 mg by mouth at bedtime.   FLUoxetine  20 MG capsule Commonly known as: PROZAC  Take 20 mg by mouth daily.   fluticasone -salmeterol 100-50 MCG/ACT Aepb Commonly known as: ADVAIR Inhale 1 puff into the lungs 2 (two) times daily.   gabapentin  400 MG capsule Commonly known as: NEURONTIN  Take 1 capsule (400 mg total) by mouth 3 (three) times daily.   Incruse Ellipta  62.5 MCG/ACT Aepb Generic drug: umeclidinium bromide  Inhale 1 puff into the lungs daily.   insulin  lispro 100 UNIT/ML injection Commonly known as: HUMALOG  Inject 0-14 Units into the skin See admin instructions. Inject 0-14 units twice daily as per sliding scale : CBG 0-200 : 0 units CBG 201-250  : 2 units CBG 251-300 : 4 units CBG 301-350 : 6 units CBG 351-400 : 8 units CBG 401-450 : 10 units CBG 451-500 : 12 units (if BP reads high, inject 14 units)   LACTOBACILLUS PO Take 1 capsule by mouth daily.   Lantus  SoloStar 100 UNIT/ML Solostar Pen Generic drug: insulin  glargine Inject 38 Units into the skin at bedtime.   levothyroxine  88 MCG tablet Commonly known as: SYNTHROID  Take 88 mcg by mouth in the morning.   LORazepam  0.5 MG tablet Commonly known as: ATIVAN  Take 0.5 mg by mouth 2 (two) times daily.   losartan  25 MG tablet Commonly known as: COZAAR  Take 37.5 mg by mouth daily.   Melatonin 5 MG Tbdp Take 7.5 mg by mouth at bedtime.   omeprazole  20 MG capsule Commonly known as: PRILOSEC Take 1 capsule (20 mg total) by mouth in the morning.   ondansetron  4 MG tablet Commonly known as: ZOFRAN  Take 4 mg by mouth every 8 (eight) hours as needed for nausea or vomiting.   OXYGEN  Inhale 2 L/min into the lungs as needed (for shortness of breath).   polyethylene glycol powder 17 GM/SCOOP powder Commonly known as: GLYCOLAX /MIRALAX  Take 17 g by mouth daily.   QUEtiapine  25 MG tablet Commonly known as: SEROQUEL  Take 1 tablet (25 mg total) by mouth at bedtime. What changed: how much to take   senna-docusate 8.6-50 MG tablet Commonly known as: Senokot-S Take 1 tablet by mouth 2 (two) times daily.   Trulicity  3 MG/0.5ML Soaj Generic drug: Dulaglutide  Inject 3 mg into the skin every 7 (seven) days.   Vitamin D3 1.25 MG (50000 UT) Caps Take 50,000 Units by mouth every Thursday.   witch hazel-glycerin pad Commonly known as: TUCKS Apply 1 Application topically every 4 (four) hours as needed for hemorrhoids.        Contact information for after-discharge care     Destination     [facility] .   Service: Skilled Nursing Contact information: [address] [place]  [address] [phone]                     Allergies[1]  Consultations: None   Procedures/Studies: CT ABDOMEN PELVIS W CONTRAST Result Date: 10/09/2024 CLINICAL DATA:  Abdominal pain EXAM: CT ABDOMEN AND PELVIS WITH CONTRAST TECHNIQUE: Multidetector CT imaging of the abdomen and pelvis was performed using the standard protocol following bolus administration of intravenous contrast. RADIATION DOSE REDUCTION: This exam was performed according to the departmental dose-optimization program which includes automated exposure control, adjustment of the mA and/or kV according to patient size  and/or use of iterative reconstruction technique. CONTRAST:  75mL OMNIPAQUE  IOHEXOL  350 MG/ML SOLN COMPARISON:  MRI 02/17/2024, CT 10/21/2023 FINDINGS: Lower chest: Lung bases demonstrate focal nodular density in the subpleural left lower lobe. See separately dictated chest CT Hepatobiliary: No focal liver abnormality is seen. No gallstones, gallbladder wall thickening, or biliary dilatation. Pancreas: Unremarkable. No pancreatic ductal dilatation or surrounding inflammatory changes. Spleen: Normal in size without focal abnormality. Adrenals/Urinary Tract: Adrenal glands are normal. No hydronephrosis. Cortical scarring and atrophy of the right kidney. Cortical scarring of the upper pole left kidney. Focal hypodensity right kidney best seen on coronal series 7, image 37 measuring 10 mm, appears more low-density today, on the prior exam measured 16 mm. Bladder is unremarkable except for small focus of gas Stomach/Bowel: Stomach within normal limits. No dilated small bowel. No acute bowel wall thickening. Negative appendix Vascular/Lymphatic: Aortic atherosclerosis. No enlarged abdominal or pelvic lymph nodes. Reproductive: Uterus and bilateral adnexa are unremarkable. Other: No ascites or free air Musculoskeletal: No acute or suspicious osseous abnormality IMPRESSION: 1. No CT evidence for acute intra-abdominal or pelvic abnormality. 2. Cortical scarring and atrophy of  the right kidney. Cortical scarring of the upper pole left kidney. Focal hypodensity in the right kidney appears more low-density today and less conspicuous than on the prior exam, reference MRI 02/17/2024 for additional follow-up recommendations 3. Small focus of gas in the urinary bladder, correlate for recent instrumentation. 4. Aortic atherosclerosis. Aortic Atherosclerosis (ICD10-I70.0). Electronically Signed   By: Luke Bun M.D.   On: 10/09/2024 15:39   CT Angio Chest PE W and/or Wo Contrast Result Date: 10/09/2024 CLINICAL DATA:  Abdominal pain chest pain EXAM: CT ANGIOGRAPHY CHEST WITH CONTRAST TECHNIQUE: Multidetector CT imaging of the chest was performed using the standard protocol during bolus administration of intravenous contrast. Multiplanar CT image reconstructions and MIPs were obtained to evaluate the vascular anatomy. RADIATION DOSE REDUCTION: This exam was performed according to the departmental dose-optimization program which includes automated exposure control, adjustment of the mA and/or kV according to patient size and/or use of iterative reconstruction technique. CONTRAST:  75mL OMNIPAQUE  IOHEXOL  350 MG/ML SOLN COMPARISON:  Chest x-ray 10/09/2024, chest CT 06/02/2024 FINDINGS: Cardiovascular: Satisfactory opacification of the pulmonary arteries to the segmental level. No evidence of pulmonary embolism. Mild atherosclerosis. Negative for aortic aneurysm. Multi vessel coronary vascular calcification. Normal cardiac size. No pericardial effusion Mediastinum/Nodes: Patent trachea. No suspicious thyroid mass. No suspicious lymph nodes. Esophagus within normal limits. Lungs/Pleura: No acute airspace disease. Mild emphysema. Bilateral bronchial wall thickening. Subpleural left lower lobe nodule or nodular focus of consolidation with mild surrounding ground-glass disease measuring up to 12 mm, series 5, image 115. Upper Abdomen: See separately dictated abdominopelvic CT Musculoskeletal: No  acute osseous abnormality Review of the MIP images confirms the above findings. IMPRESSION: 1. Negative for acute pulmonary embolus. 2. Emphysema with bilateral bronchial wall thickening. 3. 12 mm subpleural left lower lobe nodule or nodular focus of consolidation with mild surrounding ground-glass disease. This may be infectious or inflammatory, but recommend CT chest follow-up in 3 months to ensure resolution. 4. Aortic atherosclerosis. Aortic Atherosclerosis (ICD10-I70.0) and Emphysema (ICD10-J43.9). Electronically Signed   By: Luke Bun M.D.   On: 10/09/2024 15:17   DG Chest 2 View Result Date: 10/09/2024 EXAM: 2 VIEW(S) XRAY OF THE CHEST 10/09/2024 08:39:00 AM COMPARISON: 06/02/2024 CLINICAL HISTORY: Chest pain and jaw pain. FINDINGS: LUNGS AND PLEURA: No focal pulmonary opacity. No pleural effusion. No pneumothorax. HEART AND MEDIASTINUM: No acute abnormality of the cardiac  and mediastinal silhouettes. BONES AND SOFT TISSUES: Multilevel degenerative changes of thoracic spine. IMPRESSION: 1. No acute cardiopulmonary abnormality. Electronically signed by: Waddell Calk MD 10/09/2024 08:49 AM EST RP Workstation: [id number]     Subjective: No acute issues or events overnight denies nausea vomit diarrhea constipation any fevers chills or chest pain   Discharge Exam: Vitals:   10/14/24 0451 10/14/24 0859  BP: (!) 103/58 105/61  Pulse: 75 75  Resp: 18 18  Temp: 98.1 F (36.7 C) 97.7 F (36.5 C)  SpO2: 90% 94%   Vitals:   10/13/24 1649 10/13/24 2014 10/14/24 0451 10/14/24 0859  BP: 114/84 130/87 (!) 103/58 105/61  Pulse: 71 73 75 75  Resp: 18 18 18 18   Temp: 98.1 F (36.7 C) 98 F (36.7 C) 98.1 F (36.7 C) 97.7 F (36.5 C)  TempSrc: Oral Oral  Oral  SpO2: 93% 92% 90% 94%  Weight:      Height:        General: Pt is alert, awake, not in acute distress Cardiovascular: RRR, S1/S2 +, no rubs, no gallops Respiratory: CTA bilaterally, no wheezing, no rhonchi Abdominal: Soft, NT, ND,  bowel sounds + Extremities: no edema, no cyanosis    The results of significant diagnostics from this hospitalization (including imaging, microbiology, ancillary and laboratory) are listed below for reference.     Microbiology: Recent Results (from the past 240 hours)  Resp panel by RT-PCR (RSV, Flu A&B, Covid) Anterior Nasal Swab     Status: None   Collection Time: 10/09/24 12:37 PM   Specimen: Anterior Nasal Swab  Result Value Ref Range Status   SARS Coronavirus 2 by RT PCR NEGATIVE NEGATIVE Final   Influenza A by PCR NEGATIVE NEGATIVE Final   Influenza B by PCR NEGATIVE NEGATIVE Final    Comment: (NOTE) The Xpert Xpress SARS-CoV-2/FLU/RSV plus assay is intended as an aid in the diagnosis of influenza from Nasopharyngeal swab specimens and should not be used as a sole basis for treatment. Nasal washings and aspirates are unacceptable for Xpert Xpress SARS-CoV-2/FLU/RSV testing.  Fact Sheet for Patients: bloggercourse.com  Fact Sheet for Healthcare Providers: seriousbroker.it  This test is not yet approved or cleared by the [place]  FDA and has been authorized for detection and/or diagnosis of SARS-CoV-2 by FDA under an Emergency Use Authorization (EUA). This EUA will remain in effect (meaning this test can be used) for the duration of the COVID-19 declaration under Section 564(b)(1) of the Act, 21 U.S.C. section 360bbb-3(b)(1), unless the authorization is terminated or revoked.     Resp Syncytial Virus by PCR NEGATIVE NEGATIVE Final    Comment: (NOTE) Fact Sheet for Patients: bloggercourse.com  Fact Sheet for Healthcare Providers: seriousbroker.it  This test is not yet approved or cleared by the [place]  FDA and has been authorized for detection and/or diagnosis of SARS-CoV-2 by FDA under an Emergency Use Authorization (EUA). This EUA will remain in effect  (meaning this test can be used) for the duration of the COVID-19 declaration under Section 564(b)(1) of the Act, 21 U.S.C. section 360bbb-3(b)(1), unless the authorization is terminated or revoked.  Performed at [facility], [address] [place], [place] [address]   Urine Culture     Status: Abnormal   Collection Time: 10/09/24 12:57 PM   Specimen: Urine, Clean Catch  Result Value Ref Range Status   Specimen Description URINE, CLEAN CATCH  Final   Special Requests   Final    NONE Performed at [facility], [address] [place], [place] [address]    Culture (A)  Final    >=100,000 COLONIES/mL ESCHERICHIA COLI Confirmed Extended Spectrum Beta-Lactamase Producer (ESBL).  In bloodstream infections from ESBL organisms, carbapenems are preferred over piperacillin/tazobactam. They are shown to have a lower risk of mortality.    Report Status 10/12/2024 FINAL  Final   Organism ID, Bacteria ESCHERICHIA COLI (A)  Final      Susceptibility   Escherichia coli - MIC*    AMPICILLIN >=32 RESISTANT Resistant     CEFAZOLIN (URINE) Value in next row Resistant      >=32 RESISTANTThis is a modified FDA-approved test that has been validated and its performance characteristics determined by the reporting laboratory.  This laboratory is certified under the Clinical Laboratory Improvement Amendments CLIA as qualified to perform high complexity clinical laboratory testing.    CEFEPIME  Value in next row Resistant      >=32 RESISTANTThis is a modified FDA-approved test that has been validated and its performance characteristics determined by the reporting laboratory.  This laboratory is certified under the Clinical Laboratory Improvement Amendments CLIA as qualified to perform high complexity clinical laboratory testing.    ERTAPENEM  Value in next row Sensitive      >=32 RESISTANTThis is a modified FDA-approved test that has been validated and its performance characteristics determined by the  reporting laboratory.  This laboratory is certified under the Clinical Laboratory Improvement Amendments CLIA as qualified to perform high complexity clinical laboratory testing.    CEFTRIAXONE  Value in next row Resistant      >=32 RESISTANTThis is a modified FDA-approved test that has been validated and its performance characteristics determined by the reporting laboratory.  This laboratory is certified under the Clinical Laboratory Improvement Amendments CLIA as qualified to perform high complexity clinical laboratory testing.    CIPROFLOXACIN  Value in next row Resistant      >=32 RESISTANTThis is a modified FDA-approved test that has been validated and its performance characteristics determined by the reporting laboratory.  This laboratory is certified under the Clinical Laboratory Improvement Amendments CLIA as qualified to perform high complexity clinical laboratory testing.    GENTAMICIN Value in next row Sensitive      >=32 RESISTANTThis is a modified FDA-approved test that has been validated and its performance characteristics determined by the reporting laboratory.  This laboratory is certified under the Clinical Laboratory Improvement Amendments CLIA as qualified to perform high complexity clinical laboratory testing.    NITROFURANTOIN  Value in next row Intermediate      >=32 RESISTANTThis is a modified FDA-approved test that has been validated and its performance characteristics determined by the reporting laboratory.  This laboratory is certified under the Clinical Laboratory Improvement Amendments CLIA as qualified to perform high complexity clinical laboratory testing.    TRIMETH /SULFA  Value in next row Resistant      >=32 RESISTANTThis is a modified FDA-approved test that has been validated and its performance characteristics determined by the reporting laboratory.  This laboratory is certified under the Clinical Laboratory Improvement Amendments CLIA as qualified to perform high complexity  clinical laboratory testing.    AMPICILLIN/SULBACTAM Value in next row Intermediate      >=32 RESISTANTThis is a modified FDA-approved test that has been validated and its performance characteristics determined by the reporting laboratory.  This laboratory is certified under the Clinical Laboratory Improvement Amendments CLIA as qualified to perform high complexity clinical laboratory testing.    PIP/TAZO Value in next row Sensitive      <=  4 SENSITIVEThis is a modified FDA-approved test that has been validated and its performance characteristics determined by the reporting laboratory.  This laboratory is certified under the Clinical Laboratory Improvement Amendments CLIA as qualified to perform high complexity clinical laboratory testing.    MEROPENEM  Value in next row Sensitive      <=4 SENSITIVEThis is a modified FDA-approved test that has been validated and its performance characteristics determined by the reporting laboratory.  This laboratory is certified under the Clinical Laboratory Improvement Amendments CLIA as qualified to perform high complexity clinical laboratory testing.    * >=100,000 COLONIES/mL ESCHERICHIA COLI  MRSA Next Gen by PCR, Nasal     Status: None   Collection Time: 10/10/24 11:05 PM   Specimen: Nasal Mucosa; Nasal Swab  Result Value Ref Range Status   MRSA by PCR Next Gen NOT DETECTED NOT DETECTED Final    Comment: (NOTE) The GeneXpert MRSA Assay (FDA approved for NASAL specimens only), is one component of a comprehensive MRSA colonization surveillance program. It is not intended to diagnose MRSA infection nor to guide or monitor treatment for MRSA infections. Test performance is not FDA approved in patients less than 5 years old. Performed at [facility], [address] [place], [place] [address]      Labs: BNP (last 3 results) No results for input(s): BNP in the last 8760 hours. Basic Metabolic Panel: Recent Labs  Lab 10/09/24 0800 10/10/24 0245  10/11/24 0417 10/12/24 0410 10/13/24 0336  NA 134* 136 138 138 136  K 5.1 4.6 4.4 4.3 4.1  CL 95* 97* 100 99 97*  CO2 26 31 30 30 30   GLUCOSE 224* 125* 132* 115* 112*  BUN 29* 25* 20 16 16   CREATININE 1.31* 1.36* 1.17* 1.20* 1.14*  CALCIUM  10.2 9.5 9.1 9.1 9.5   Liver Function Tests: Recent Labs  Lab 10/09/24 1256 10/10/24 0245  AST 25 20  ALT 8 8  ALKPHOS 79 72  BILITOT 0.3 0.2  PROT 7.6 6.8  ALBUMIN 4.0 3.6   Recent Labs  Lab 10/09/24 1256  LIPASE 36   No results for input(s): AMMONIA in the last 168 hours. CBC: Recent Labs  Lab 10/09/24 0800 10/10/24 0245 10/11/24 0417 10/12/24 0410  WBC 12.2* 10.2 8.0 8.5  HGB 14.0 13.0 12.3 12.0  HCT 44.5 41.1 38.3 37.6  MCV 86.6 87.8 86.7 85.5  PLT 250 232 210 185   Cardiac Enzymes: No results for input(s): CKTOTAL, CKMB, CKMBINDEX, TROPONINI in the last 168 hours. BNP: Invalid input(s): POCBNP CBG: Recent Labs  Lab 10/13/24 1110 10/13/24 1651 10/13/24 2015 10/14/24 0722 10/14/24 1120  GLUCAP 105* 135* 165* 133* 155*   D-Dimer No results for input(s): DDIMER in the last 72 hours. Hgb A1c No results for input(s): HGBA1C in the last 72 hours. Lipid Profile No results for input(s): CHOL, HDL, LDLCALC, TRIG, CHOLHDL, LDLDIRECT in the last 72 hours. Thyroid function studies No results for input(s): TSH, T4TOTAL, T3FREE, THYROIDAB in the last 72 hours.  Invalid input(s): FREET3 Anemia work up No results for input(s): VITAMINB12, FOLATE, FERRITIN, TIBC, IRON, RETICCTPCT in the last 72 hours. Urinalysis    Component Value Date/Time   COLORURINE YELLOW 10/09/2024 0822   APPEARANCEUR CLOUDY (A) 10/09/2024 0822   APPEARANCEUR Clear 11/24/2012 1238   LABSPEC 1.009 10/09/2024 0822   LABSPEC 1.000 11/24/2012 1238   PHURINE 6.0 10/09/2024 0822   GLUCOSEU NEGATIVE 10/09/2024 0822   GLUCOSEU >=500 11/24/2012 1238   HGBUR NEGATIVE 10/09/2024 9177  BILIRUBINUR  NEGATIVE 10/09/2024 0822   BILIRUBINUR Negative 11/24/2012 1238   KETONESUR NEGATIVE 10/09/2024 0822   PROTEINUR NEGATIVE 10/09/2024 0822   UROBILINOGEN 1.0 04/30/2010 0902   NITRITE NEGATIVE 10/09/2024 0822   LEUKOCYTESUR LARGE (A) 10/09/2024 0822   LEUKOCYTESUR Negative 11/24/2012 1238   Sepsis Labs Recent Labs  Lab 10/09/24 0800 10/10/24 0245 10/11/24 0417 10/12/24 0410  WBC 12.2* 10.2 8.0 8.5   Microbiology Recent Results (from the past 240 hours)  Resp panel by RT-PCR (RSV, Flu A&B, Covid) Anterior Nasal Swab     Status: None   Collection Time: 10/09/24 12:37 PM   Specimen: Anterior Nasal Swab  Result Value Ref Range Status   SARS Coronavirus 2 by RT PCR NEGATIVE NEGATIVE Final   Influenza A by PCR NEGATIVE NEGATIVE Final   Influenza B by PCR NEGATIVE NEGATIVE Final    Comment: (NOTE) The Xpert Xpress SARS-CoV-2/FLU/RSV plus assay is intended as an aid in the diagnosis of influenza from Nasopharyngeal swab specimens and should not be used as a sole basis for treatment. Nasal washings and aspirates are unacceptable for Xpert Xpress SARS-CoV-2/FLU/RSV testing.  Fact Sheet for Patients: bloggercourse.com  Fact Sheet for Healthcare Providers: seriousbroker.it  This test is not yet approved or cleared by the [place]  FDA and has been authorized for detection and/or diagnosis of SARS-CoV-2 by FDA under an Emergency Use Authorization (EUA). This EUA will remain in effect (meaning this test can be used) for the duration of the COVID-19 declaration under Section 564(b)(1) of the Act, 21 U.S.C. section 360bbb-3(b)(1), unless the authorization is terminated or revoked.     Resp Syncytial Virus by PCR NEGATIVE NEGATIVE Final    Comment: (NOTE) Fact Sheet for Patients: bloggercourse.com  Fact Sheet for Healthcare Providers: seriousbroker.it  This test is not yet  approved or cleared by the [place]  FDA and has been authorized for detection and/or diagnosis of SARS-CoV-2 by FDA under an Emergency Use Authorization (EUA). This EUA will remain in effect (meaning this test can be used) for the duration of the COVID-19 declaration under Section 564(b)(1) of the Act, 21 U.S.C. section 360bbb-3(b)(1), unless the authorization is terminated or revoked.  Performed at [facility], [address], [place], [place] [address]   Urine Culture     Status: Abnormal   Collection Time: 10/09/24 12:57 PM   Specimen: Urine, Clean Catch  Result Value Ref Range Status   Specimen Description URINE, CLEAN CATCH  Final   Special Requests   Final    NONE Performed at [facility], [address] [place], [place] [address]    Culture (A)  Final    >=100,000 COLONIES/mL ESCHERICHIA COLI Confirmed Extended Spectrum Beta-Lactamase Producer (ESBL).  In bloodstream infections from ESBL organisms, carbapenems are preferred over piperacillin/tazobactam. They are shown to have a lower risk of mortality.    Report Status 10/12/2024 FINAL  Final   Organism ID, Bacteria ESCHERICHIA COLI (A)  Final      Susceptibility   Escherichia coli - MIC*    AMPICILLIN >=32 RESISTANT Resistant     CEFAZOLIN (URINE) Value in next row Resistant      >=32 RESISTANTThis is a modified FDA-approved test that has been validated and its performance characteristics determined by the reporting laboratory.  This laboratory is certified under the Clinical Laboratory Improvement Amendments CLIA as qualified to perform high complexity clinical laboratory testing.    CEFEPIME  Value in next row Resistant      >=32  RESISTANTThis is a modified FDA-approved test that has been validated and its performance characteristics determined by the reporting laboratory.  This laboratory is certified under the Clinical Laboratory Improvement Amendments CLIA as qualified to perform high complexity  clinical laboratory testing.    ERTAPENEM  Value in next row Sensitive      >=32 RESISTANTThis is a modified FDA-approved test that has been validated and its performance characteristics determined by the reporting laboratory.  This laboratory is certified under the Clinical Laboratory Improvement Amendments CLIA as qualified to perform high complexity clinical laboratory testing.    CEFTRIAXONE  Value in next row Resistant      >=32 RESISTANTThis is a modified FDA-approved test that has been validated and its performance characteristics determined by the reporting laboratory.  This laboratory is certified under the Clinical Laboratory Improvement Amendments CLIA as qualified to perform high complexity clinical laboratory testing.    CIPROFLOXACIN  Value in next row Resistant      >=32 RESISTANTThis is a modified FDA-approved test that has been validated and its performance characteristics determined by the reporting laboratory.  This laboratory is certified under the Clinical Laboratory Improvement Amendments CLIA as qualified to perform high complexity clinical laboratory testing.    GENTAMICIN Value in next row Sensitive      >=32 RESISTANTThis is a modified FDA-approved test that has been validated and its performance characteristics determined by the reporting laboratory.  This laboratory is certified under the Clinical Laboratory Improvement Amendments CLIA as qualified to perform high complexity clinical laboratory testing.    NITROFURANTOIN  Value in next row Intermediate      >=32 RESISTANTThis is a modified FDA-approved test that has been validated and its performance characteristics determined by the reporting laboratory.  This laboratory is certified under the Clinical Laboratory Improvement Amendments CLIA as qualified to perform high complexity clinical laboratory testing.    TRIMETH /SULFA  Value in next row Resistant      >=32 RESISTANTThis is a modified FDA-approved test that has been  validated and its performance characteristics determined by the reporting laboratory.  This laboratory is certified under the Clinical Laboratory Improvement Amendments CLIA as qualified to perform high complexity clinical laboratory testing.    AMPICILLIN/SULBACTAM Value in next row Intermediate      >=32 RESISTANTThis is a modified FDA-approved test that has been validated and its performance characteristics determined by the reporting laboratory.  This laboratory is certified under the Clinical Laboratory Improvement Amendments CLIA as qualified to perform high complexity clinical laboratory testing.    PIP/TAZO Value in next row Sensitive      <=4 SENSITIVEThis is a modified FDA-approved test that has been validated and its performance characteristics determined by the reporting laboratory.  This laboratory is certified under the Clinical Laboratory Improvement Amendments CLIA as qualified to perform high complexity clinical laboratory testing.    MEROPENEM  Value in next row Sensitive      <=4 SENSITIVEThis is a modified FDA-approved test that has been validated and its performance characteristics determined by the reporting laboratory.  This laboratory is certified under the Clinical Laboratory Improvement Amendments CLIA as qualified to perform high complexity clinical laboratory testing.    * >=100,000 COLONIES/mL ESCHERICHIA COLI  MRSA Next Gen by PCR, Nasal     Status: None   Collection Time: 10/10/24 11:05 PM   Specimen: Nasal Mucosa; Nasal Swab  Result Value Ref Range Status   MRSA by PCR Next Gen NOT DETECTED NOT DETECTED Final    Comment: (NOTE) The GeneXpert MRSA Assay (FDA  approved for NASAL specimens only), is one component of a comprehensive MRSA colonization surveillance program. It is not intended to diagnose MRSA infection nor to guide or monitor treatment for MRSA infections. Test performance is not FDA approved in patients less than 33 years old. Performed at [facility], [address], [place], [place] [address]      Time coordinating discharge: Over 30 minutes  SIGNED:   Elsie JAYSON Montclair, DO [facility] 10/14/2024, 11:39 AM Pager   If 7PM-7AM, please contact night-coverage [email or web address]     [1]  Allergies Allergen Reactions   Cipro  [Ciprofloxacin  Hcl] Hives   Guaifenesin  Anaphylaxis   Kiwi Extract Anaphylaxis and Rash   Levaquin [Levofloxacin] Shortness Of Breath and Rash   Strawberry Extract Anaphylaxis and Rash   Lioresal [Baclofen] Other (See Comments)    Near syncope/ fall   Vancomycin  Itching

## 2024-10-19 ENCOUNTER — Ambulatory Visit
